# Patient Record
Sex: Male | Born: 1945 | Race: White | Hispanic: No | Smoking: Former smoker
Health system: Southern US, Community
[De-identification: ages and names within clinical notes are randomized; demographics above are authoritative.]

## PROBLEM LIST (undated history)

## (undated) DIAGNOSIS — I700 Atherosclerosis of aorta: Secondary | ICD-10-CM

## (undated) DIAGNOSIS — Z8719 Personal history of other diseases of the digestive system: Secondary | ICD-10-CM

## (undated) DIAGNOSIS — Z955 Presence of coronary angioplasty implant and graft: Secondary | ICD-10-CM

## (undated) DIAGNOSIS — J90 Pleural effusion, not elsewhere classified: Secondary | ICD-10-CM

## (undated) DIAGNOSIS — Z87442 Personal history of urinary calculi: Secondary | ICD-10-CM

## (undated) DIAGNOSIS — I517 Cardiomegaly: Secondary | ICD-10-CM

## (undated) DIAGNOSIS — R351 Nocturia: Secondary | ICD-10-CM

## (undated) DIAGNOSIS — M109 Gout, unspecified: Secondary | ICD-10-CM

## (undated) DIAGNOSIS — I10 Essential (primary) hypertension: Secondary | ICD-10-CM

## (undated) DIAGNOSIS — G894 Chronic pain syndrome: Secondary | ICD-10-CM

## (undated) DIAGNOSIS — N183 Chronic kidney disease, stage 3 unspecified: Secondary | ICD-10-CM

## (undated) DIAGNOSIS — M549 Dorsalgia, unspecified: Secondary | ICD-10-CM

## (undated) DIAGNOSIS — Z973 Presence of spectacles and contact lenses: Secondary | ICD-10-CM

## (undated) DIAGNOSIS — R609 Edema, unspecified: Secondary | ICD-10-CM

## (undated) DIAGNOSIS — I2510 Atherosclerotic heart disease of native coronary artery without angina pectoris: Secondary | ICD-10-CM

## (undated) DIAGNOSIS — M5137 Other intervertebral disc degeneration, lumbosacral region: Secondary | ICD-10-CM

## (undated) DIAGNOSIS — L0591 Pilonidal cyst without abscess: Secondary | ICD-10-CM

## (undated) DIAGNOSIS — K5909 Other constipation: Secondary | ICD-10-CM

## (undated) DIAGNOSIS — R0600 Dyspnea, unspecified: Secondary | ICD-10-CM

## (undated) DIAGNOSIS — J849 Interstitial pulmonary disease, unspecified: Secondary | ICD-10-CM

## (undated) DIAGNOSIS — M79602 Pain in left arm: Secondary | ICD-10-CM

## (undated) DIAGNOSIS — R600 Localized edema: Secondary | ICD-10-CM

## (undated) DIAGNOSIS — M51379 Other intervertebral disc degeneration, lumbosacral region without mention of lumbar back pain or lower extremity pain: Secondary | ICD-10-CM

## (undated) DIAGNOSIS — I6523 Occlusion and stenosis of bilateral carotid arteries: Secondary | ICD-10-CM

## (undated) DIAGNOSIS — D3502 Benign neoplasm of left adrenal gland: Secondary | ICD-10-CM

## (undated) DIAGNOSIS — Z972 Presence of dental prosthetic device (complete) (partial): Secondary | ICD-10-CM

## (undated) DIAGNOSIS — M5030 Other cervical disc degeneration, unspecified cervical region: Secondary | ICD-10-CM

## (undated) DIAGNOSIS — E785 Hyperlipidemia, unspecified: Secondary | ICD-10-CM

## (undated) DIAGNOSIS — I519 Heart disease, unspecified: Secondary | ICD-10-CM

## (undated) DIAGNOSIS — J8410 Pulmonary fibrosis, unspecified: Secondary | ICD-10-CM

## (undated) DIAGNOSIS — I5189 Other ill-defined heart diseases: Secondary | ICD-10-CM

## (undated) DIAGNOSIS — R200 Anesthesia of skin: Secondary | ICD-10-CM

## (undated) DIAGNOSIS — R0609 Other forms of dyspnea: Secondary | ICD-10-CM

## (undated) DIAGNOSIS — Z87898 Personal history of other specified conditions: Secondary | ICD-10-CM

## (undated) HISTORY — DX: Localized edema: R60.0

## (undated) HISTORY — PX: ANTERIOR CERVICAL DECOMP/DISCECTOMY FUSION: SHX1161

## (undated) HISTORY — PX: PILONIDAL CYST EXCISION: SHX744

## (undated) HISTORY — DX: Edema, unspecified: R60.9

## (undated) HISTORY — PX: CYSTOSCOPY W/ URETERAL STENT PLACEMENT: SHX1429

## (undated) HISTORY — DX: Essential (primary) hypertension: I10

## (undated) HISTORY — PX: CARDIOVASCULAR STRESS TEST: SHX262

## (undated) HISTORY — DX: Hyperlipidemia, unspecified: E78.5

## (undated) HISTORY — PX: CORONARY ANGIOPLASTY WITH STENT PLACEMENT: SHX49

## (undated) HISTORY — DX: Gout, unspecified: M10.9

## (undated) HISTORY — DX: Atherosclerotic heart disease of native coronary artery without angina pectoris: I25.10

## (undated) HISTORY — PX: TRANSTHORACIC ECHOCARDIOGRAM: SHX275

---

## 1994-06-21 HISTORY — PX: THROAT SURGERY: SHX803

## 1997-06-21 DIAGNOSIS — I2510 Atherosclerotic heart disease of native coronary artery without angina pectoris: Secondary | ICD-10-CM

## 1997-06-21 HISTORY — PX: CORONARY ANGIOPLASTY: SHX604

## 1997-06-21 HISTORY — DX: Atherosclerotic heart disease of native coronary artery without angina pectoris: I25.10

## 1998-04-11 ENCOUNTER — Inpatient Hospital Stay (HOSPITAL_COMMUNITY): Admission: RE | Admit: 1998-04-11 | Discharge: 1998-04-15 | Payer: Self-pay | Admitting: Cardiovascular Disease

## 2001-10-03 ENCOUNTER — Ambulatory Visit (HOSPITAL_COMMUNITY): Admission: RE | Admit: 2001-10-03 | Discharge: 2001-10-03 | Payer: Self-pay | Admitting: Family Medicine

## 2001-10-03 ENCOUNTER — Encounter: Payer: Self-pay | Admitting: Family Medicine

## 2001-12-25 ENCOUNTER — Inpatient Hospital Stay (HOSPITAL_COMMUNITY): Admission: RE | Admit: 2001-12-25 | Discharge: 2001-12-27 | Payer: Self-pay | Admitting: Neurosurgery

## 2003-02-20 ENCOUNTER — Ambulatory Visit (HOSPITAL_COMMUNITY): Admission: RE | Admit: 2003-02-20 | Discharge: 2003-02-20 | Payer: Self-pay | Admitting: Internal Medicine

## 2005-09-30 ENCOUNTER — Ambulatory Visit (HOSPITAL_COMMUNITY): Admission: RE | Admit: 2005-09-30 | Discharge: 2005-09-30 | Payer: Self-pay | Admitting: Cardiovascular Disease

## 2005-10-11 ENCOUNTER — Ambulatory Visit (HOSPITAL_COMMUNITY): Admission: RE | Admit: 2005-10-11 | Discharge: 2005-10-12 | Payer: Self-pay | Admitting: Cardiovascular Disease

## 2005-10-11 DIAGNOSIS — Z955 Presence of coronary angioplasty implant and graft: Secondary | ICD-10-CM

## 2005-10-11 HISTORY — DX: Presence of coronary angioplasty implant and graft: Z95.5

## 2007-08-10 ENCOUNTER — Encounter: Payer: Self-pay | Admitting: Orthopedic Surgery

## 2007-08-10 ENCOUNTER — Encounter: Admission: RE | Admit: 2007-08-10 | Discharge: 2007-08-10 | Payer: Self-pay | Admitting: Neurosurgery

## 2008-02-21 ENCOUNTER — Ambulatory Visit (HOSPITAL_COMMUNITY): Admission: RE | Admit: 2008-02-21 | Discharge: 2008-02-21 | Payer: Self-pay | Admitting: Podiatry

## 2009-09-08 ENCOUNTER — Ambulatory Visit: Payer: Self-pay | Admitting: Orthopedic Surgery

## 2009-09-08 DIAGNOSIS — M7650 Patellar tendinitis, unspecified knee: Secondary | ICD-10-CM | POA: Insufficient documentation

## 2009-09-08 DIAGNOSIS — M25569 Pain in unspecified knee: Secondary | ICD-10-CM | POA: Insufficient documentation

## 2009-09-11 ENCOUNTER — Telehealth: Payer: Self-pay | Admitting: Orthopedic Surgery

## 2009-09-14 ENCOUNTER — Inpatient Hospital Stay (HOSPITAL_COMMUNITY): Admission: EM | Admit: 2009-09-14 | Discharge: 2009-09-19 | Payer: Self-pay | Admitting: Emergency Medicine

## 2009-09-14 IMAGING — CR DG FOOT COMPLETE 3+V*R*
3 series · 3 of 3 positions shown · non-contrast
Comparison: None.

CLINICAL DATA: Pain

RIGHT FOOT COMPLETE - 3+ VIEW

[view not recorded (1 of 3)]
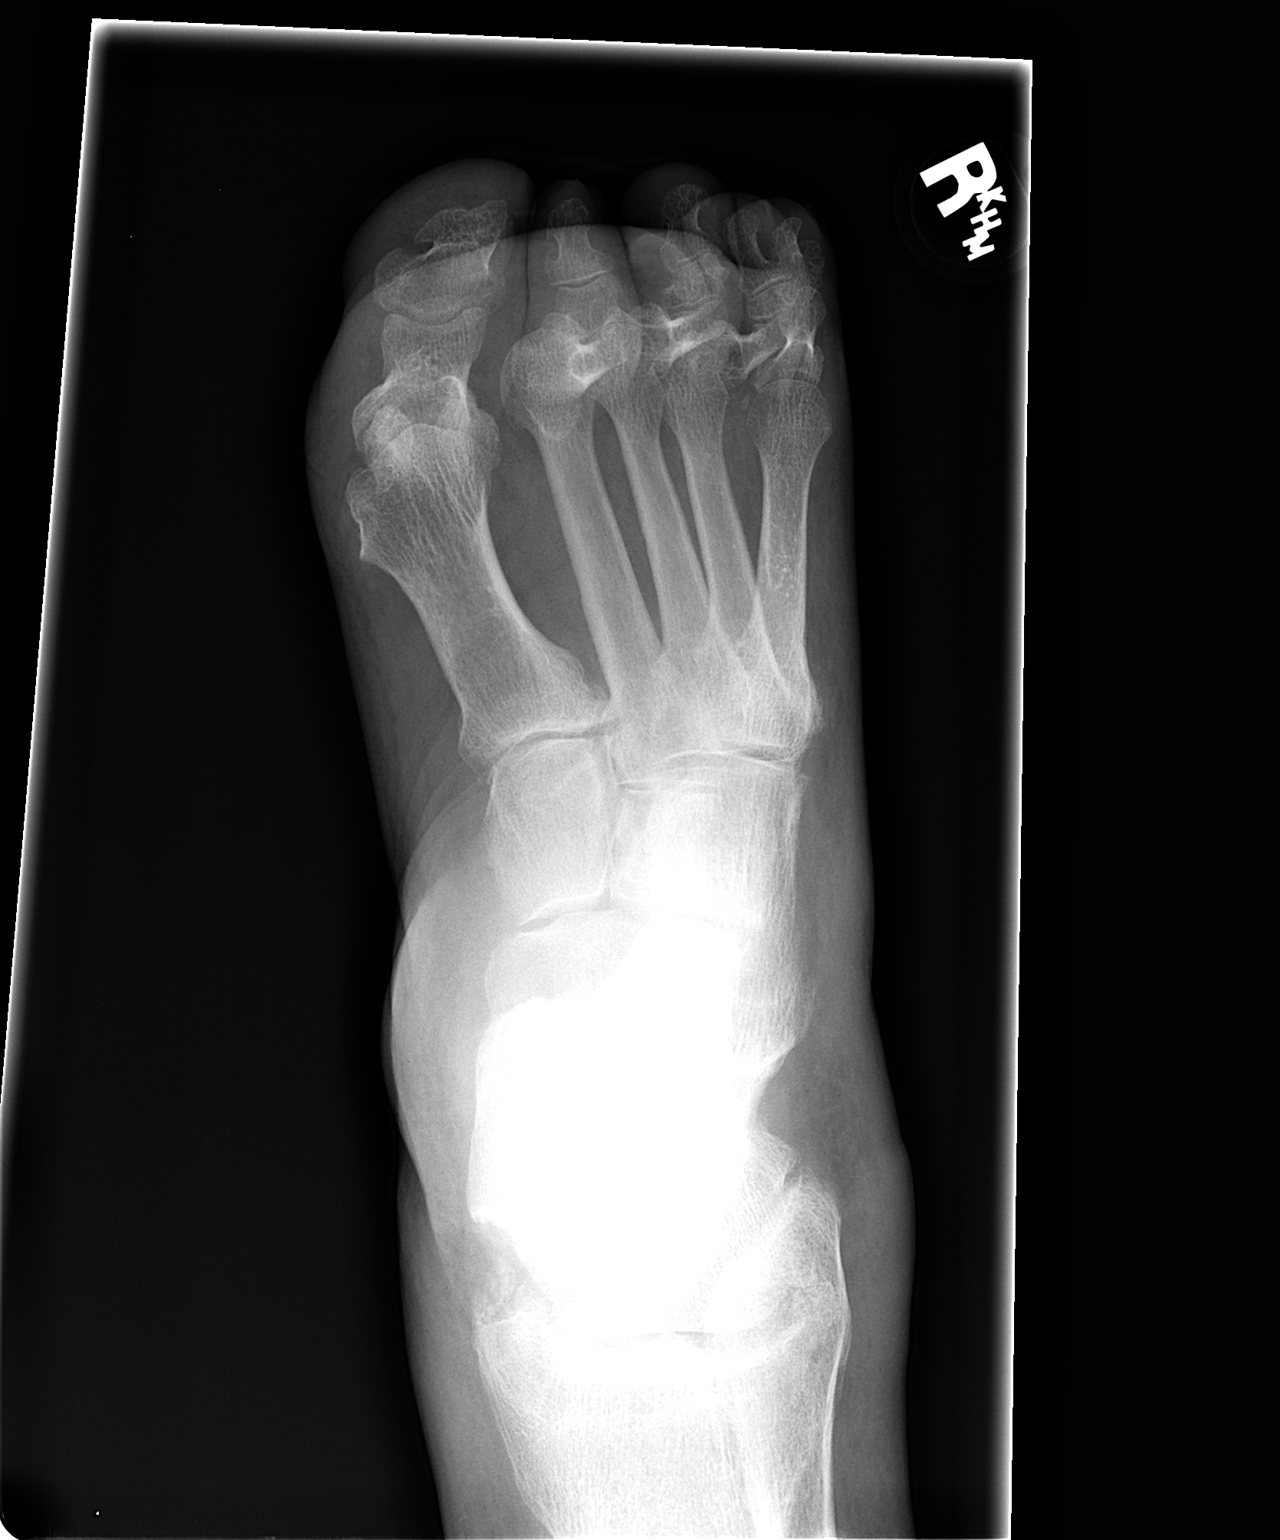

[view not recorded (2 of 3)]
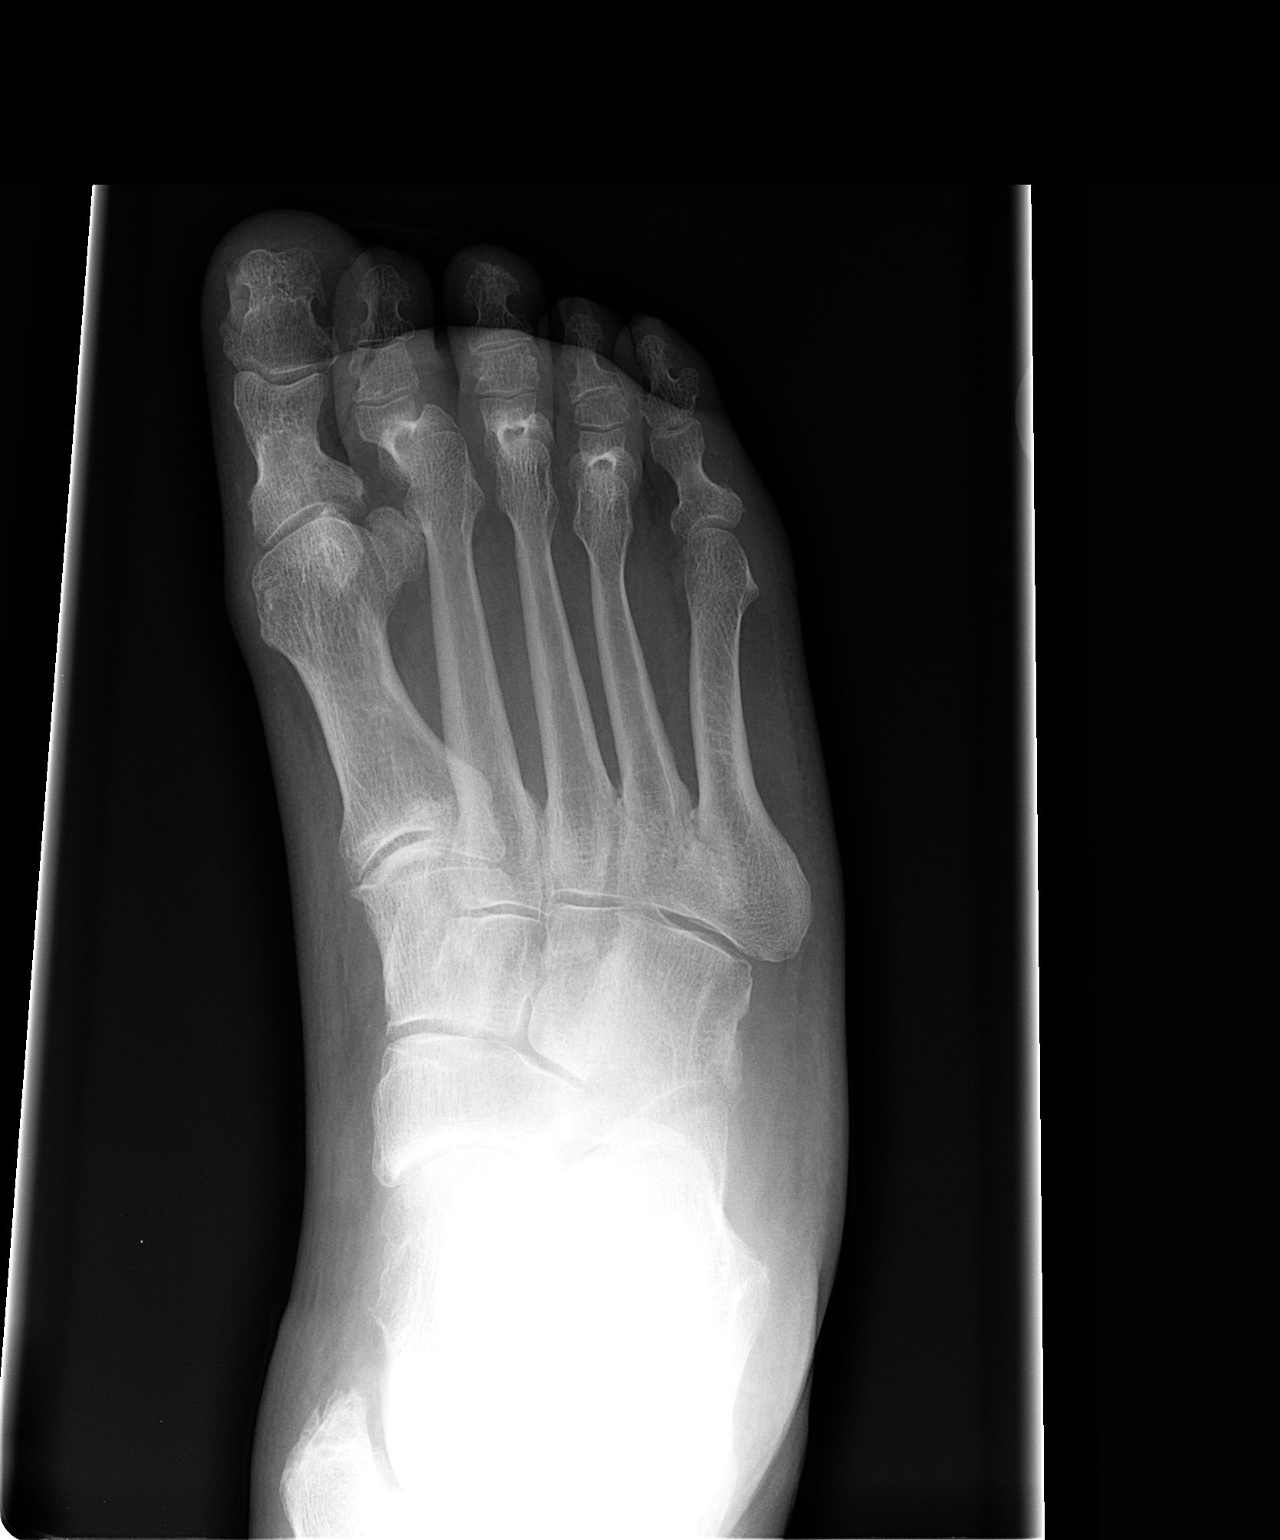

[view not recorded (3 of 3)]
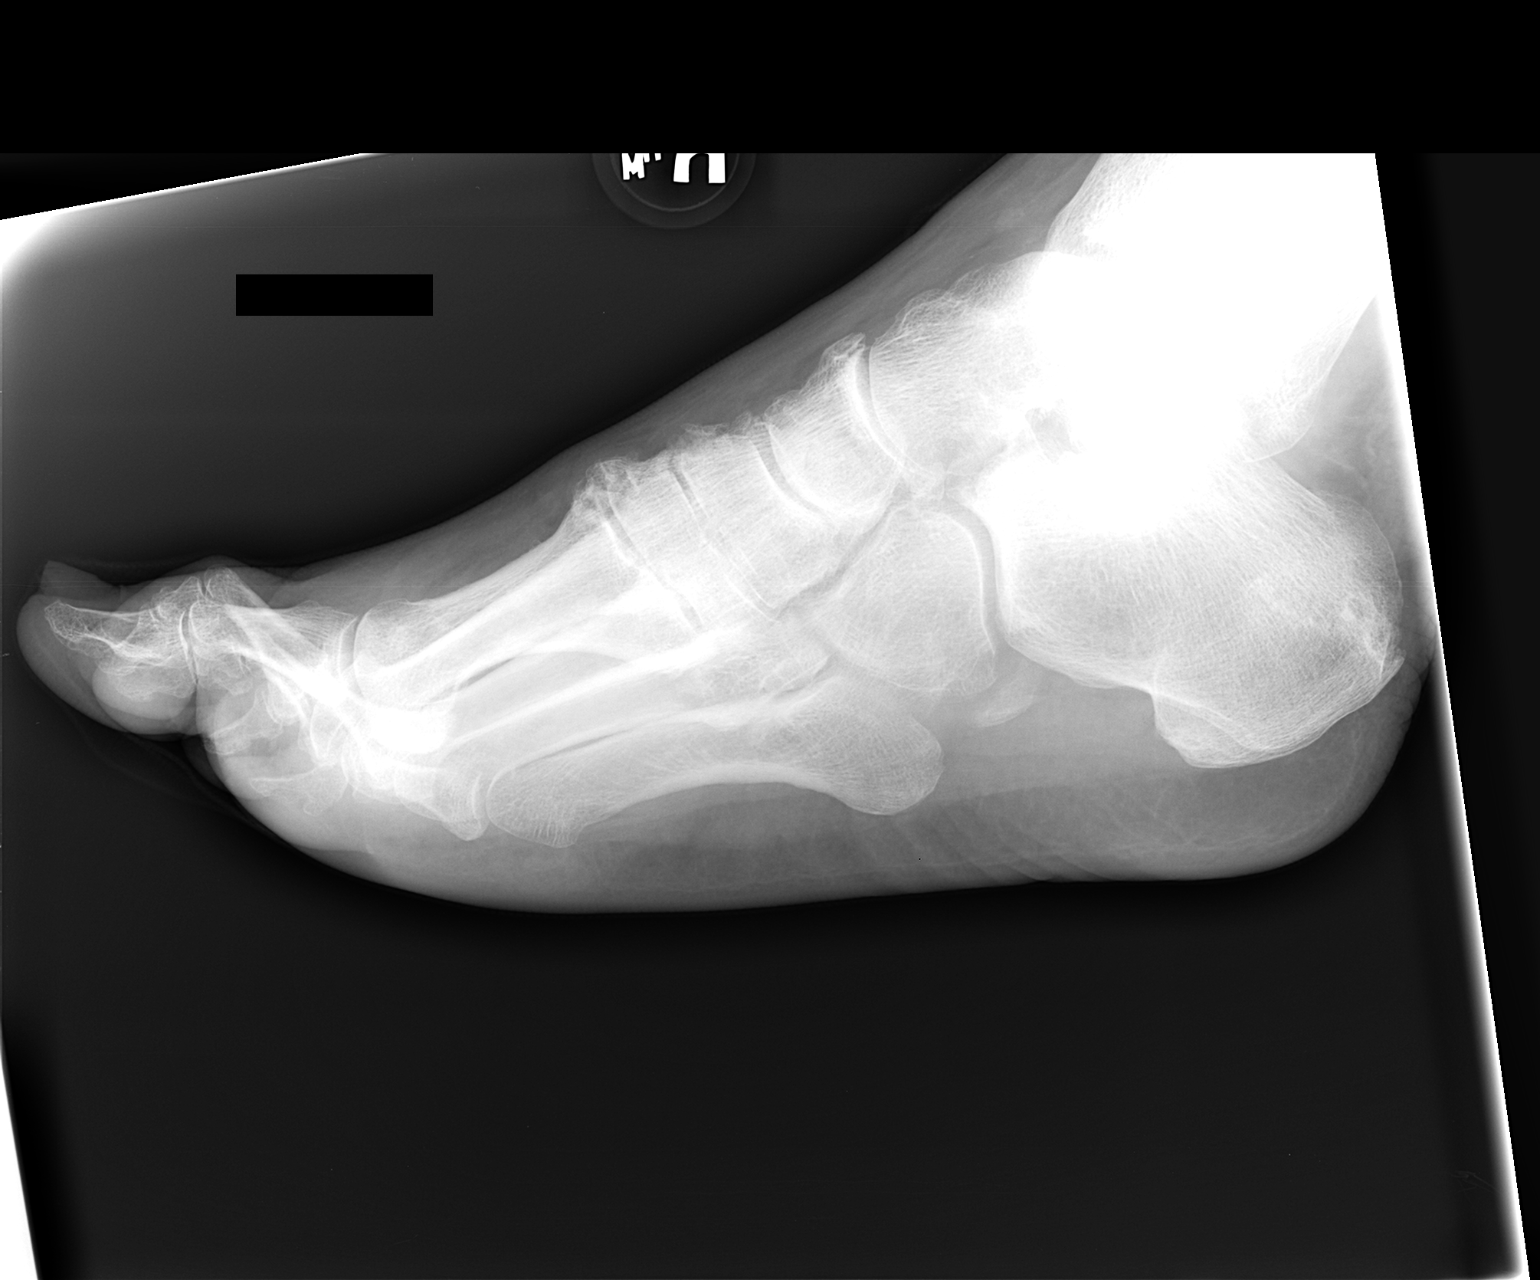

[3 of 3 positions shown; findings below may reference images not displayed]

FINDINGS: No acute fracture.  No dislocation.  Diffuse degenerative
changes in the tarsal bones.  Unremarkable soft tissues.
IMPRESSION: No acute bony pathology.

## 2009-09-15 ENCOUNTER — Ambulatory Visit: Payer: Self-pay | Admitting: Orthopedic Surgery

## 2009-09-29 ENCOUNTER — Ambulatory Visit: Payer: Self-pay | Admitting: Orthopedic Surgery

## 2009-09-30 ENCOUNTER — Encounter: Payer: Self-pay | Admitting: Orthopedic Surgery

## 2009-10-13 ENCOUNTER — Ambulatory Visit: Payer: Self-pay | Admitting: Orthopedic Surgery

## 2009-10-13 DIAGNOSIS — M109 Gout, unspecified: Secondary | ICD-10-CM

## 2009-11-19 ENCOUNTER — Ambulatory Visit: Payer: Self-pay | Admitting: Orthopedic Surgery

## 2010-02-10 ENCOUNTER — Inpatient Hospital Stay (HOSPITAL_COMMUNITY): Admission: EM | Admit: 2010-02-10 | Discharge: 2010-02-12 | Payer: Self-pay | Admitting: Emergency Medicine

## 2010-02-24 ENCOUNTER — Emergency Department (HOSPITAL_COMMUNITY): Admission: EM | Admit: 2010-02-24 | Discharge: 2010-02-24 | Payer: Self-pay | Admitting: Emergency Medicine

## 2010-03-08 ENCOUNTER — Emergency Department (HOSPITAL_COMMUNITY): Admission: EM | Admit: 2010-03-08 | Discharge: 2010-03-08 | Payer: Self-pay | Admitting: Emergency Medicine

## 2010-03-16 ENCOUNTER — Ambulatory Visit (HOSPITAL_COMMUNITY): Admission: RE | Admit: 2010-03-16 | Discharge: 2010-03-16 | Payer: Self-pay | Admitting: Urology

## 2010-03-19 ENCOUNTER — Ambulatory Visit (HOSPITAL_COMMUNITY): Admission: RE | Admit: 2010-03-19 | Discharge: 2010-03-19 | Payer: Self-pay | Admitting: Urology

## 2010-07-23 NOTE — Assessment & Plan Note (Signed)
Summary: 1 M RE-CK KNEES/SEC HORIZ/CAF   Visit Type:  Follow-up Referring Provider:  self Primary Provider:  Dr Regino Schultze  CC:  one month recheck knee.  History of Present Illness: I saw Maurice Munoz in the office today for a followup visit.  He is a 65 years old man with the complaint of:  1 month recheck knees after  Meds: Tizanidine, Melopizolol, Naproxen, Isosorb mono, Plavix, Endocet 10, Torsenude, ASA, Colcrys ran out   Indomethacin started last week for gout flare up, helps.  Had a flare up last week right knee, doing better after taking Indocin.  NAPROSYN GIVES HIM SWELLING     Allergies: 1)  ! Pcn 2)  ! Motrin   Impression & Recommendations:  Problem # 1:  GOUT (ICD-274.9) Assessment Improved  Looks like we've made headway in terms of his gout so I would like him to continue with Indocin as needed continue allopurinol twice a day did not take Naprosyn  Orders: Est. Patient Level II (57846)  Patient Instructions: 1)  DONT TAKE NAPROSYN  2)  TAKE INDOCIN AS NEEDED  3)  F/U AS NEEDED  4)  CONTINUE ALLOPURINOL two times a day

## 2010-07-23 NOTE — Progress Notes (Signed)
Summary: Pain is no better  Phone Note Call from Patient   Summary of Call: Maurice Munoz ([id number]) was seen 09/08/09 for right keen pain.  Very concerned because now the pain is in both feet and going up his other leg, so painful he can hardly walk.  Says he has been taking Naprosyn several years and is concerned that it just doesn't help his pain any more. Says his pain is no better than when he was here 09/08/09, says it is actually worse. His # [phone] Initial call taken by: Jacklynn Ganong,  September 11, 2009 12:04 PM  Follow-up for Phone Call        no further instructions right now the injection will cause increase in pain at first  Apply ice pack to the area for 20 minutes every 2 hours and take 2 xtra strength tylenol every 8 hours. This increased pain will usually resolve in 24 hours. The injection will take effect in 3-10 days.   Follow-up by: Fuller Canada MD,  September 11, 2009 12:23 PM  Additional Follow-up for Phone Call Additional follow up Details #1::        Advised the patient of doctor's reply.  He said if he got any worse he is going to the emergency room. Additional Follow-up by: Jacklynn Ganong,  September 11, 2009 2:32 PM

## 2010-07-23 NOTE — Letter (Signed)
Summary: Historic Patient File  Historic Patient File   Imported By: Elvera Maria 09/19/2009 10:18:04  _____________________________________________________________________  External Attachment:    Type:   Image     Comment:   history form

## 2010-07-23 NOTE — Assessment & Plan Note (Signed)
Summary: RT KNEE PAIN/SEC HOR/BSF   Visit Type:  Initial visit Referring Provider:  self Primary Provider:  Dr Regino Schultze  CC:  knee pain.  History of Present Illness: 65 year old male currently disabled presents with 2 weeks of throbbing constant severe knee pain which came on gradually associated with some locking.  He received an IM shot and some Indocin last week from his primary care doctor did not improve, he is also on Endocet 10 mg still did not help seems to hurt when he moves his knee.  No warmth no swelling.  Pain seems to be in the front of the knee.  Although he is using a cane and is having severe trouble ambulating Will have xrays today.  Meds: Tizanidine, Melopizolol, Naproxen, Isosorb mono, Plavix, Endocet 10, Torsenude, ASA.  I also have a report of an MRI done of his spine February 2009 he's had some previous surgery he has spinal stenosis.  He is followed by Dr. Tressie Stalker in [facility]   Allergies (verified): 1)  ! Pcn 2)  ! Motrin  Past History:  Past Medical History: chronic back pain htn cholesterol  Past Surgical History: heart stents back neck  Family History: na  Social History: Patient is married.  disabled no smoking, alcohol or caffeine  Review of Systems Constitutional:  Denies weight loss, weight gain, fever, chills, and fatigue. Cardiovascular:  Complains of chest pain; denies palpitations, fainting, and murmurs. Respiratory:  Denies short of breath, wheezing, couch, tightness, pain on inspiration, and snoring . Gastrointestinal:  Denies heartburn, nausea, vomiting, diarrhea, constipation, and blood in your stools. Genitourinary:  Denies frequency, urgency, difficulty urinating, painful urination, flank pain, and bleeding in urine. Neurologic:  Denies numbness, tingling, unsteady gait, dizziness, tremors, and seizure. Musculoskeletal:  Complains of joint pain; denies swelling, instability, stiffness, redness, heat,  and muscle pain. Endocrine:  Denies excessive thirst, exessive urination, and heat or cold intolerance. Psychiatric:  Denies nervousness, depression, anxiety, and hallucinations. Skin:  Denies changes in the skin, poor healing, rash, itching, and redness. HEENT:  Denies blurred or double vision, eye pain, redness, and watering. Immunology:  Denies seasonal allergies, sinus problems, and allergic to bee stings. Hemoatologic:  Complains of easy bleeding; denies brusing.  Physical Exam  Additional Exam:  GEN: well developed, well nourished, normal grooming and hygiene, no deformity and normal body habitus.   CDV: pulses are normal, no edema, no erythema. no tenderness  Lymph: normal lymph nodes   Skin: no rashes, skin lesions or open sores   NEURO: normal coordination, reflexes, sensation.   Psyche: awake, alert and oriented. Mood normal   Gait: abnormal he can only ambulate with partial weightbearing in a walker.  Has swelling over his RIGHT tibial tubercle is very tender warm to touch.  It affects his range of motion which is 10-110  Motor exam seems normal knee is stable.       Impression & Recommendations:  Problem # 1:  KNEE PAIN (ICD-719.46)  Orders: New Patient Level III (16109) Joint Aspirate / Injection, Large (20610) Depo- Medrol 40mg  (J1030) Knee x-ray,  3 views (60454)  Problem # 2:  PATELLAR TENDINITIS (ICD-726.64)  previous RIGHT knee  There is a bone spur and entheseophyte  in the patella tendon at the tubercle  He has no other changes there or at the distal pole of patella but he has some degenerative changes in the knee joint there mild to moderate  Impression patellar tendinitis chronic based on the spur and the  tendon mild to moderate degenerative arthritis  Injection at the patellar tendon insertion into the tubercle Verbal consent was obtained. The knee was prepped with alcohol and ethyl chloride. 1 cc of depomedrol 40mg /cc and 4 cc of  lidocaine 1% was injected. there were no complications.  Orders: New Patient Level III (16109) Joint Aspirate / Injection, Large (20610) Depo- Medrol 40mg  (J1030) Knee x-ray,  3 views (60454)  Patient Instructions: 1)  brace wear until I see you next time  2)  apply ice every 2 hours  3)  wear flector patch 1 two times a day  4)  injection 5)  return in 1 week

## 2010-07-23 NOTE — Assessment & Plan Note (Signed)
Summary: 2 WK RE-CK KNEES/SEC HORIZ/CAF   Visit Type:  Follow-up Referring Provider:  self Primary Provider:  Dr Regino Schultze  CC:  recheck knees.  History of Present Illness: I saw Maurice Munoz in the office today for a followup visit.  He is a 65 years old man with the complaint of:  2 week recheck knees after alternating gout meds.  Doing better with his knees mild pain was able to plant garden   ROS: Feet still hurt., bilateral foot pain when walking.  Meds: Tizanidine, Melopizolol, Naproxen, Isosorb mono, Plavix, Endocet 10, Torsenude, ASA, Colcrys. [take 1 colcrys per day]    Allergies: 1)  ! Pcn 2)  ! Motrin  Review of Systems Musculoskeletal:  back pain  foot pain  carpal tunnel syndrome right hand  numbness left upper extremity .  Physical Exam  Additional Exam:  GENERAL: Appearance is normal, lost 20 lbs   CDV: normal pulse and temperature   Skin: was normal   Neuro: sensation was normal   MSK feet. knees no knee swelling, no joint line tenerness, tender at the tibial tubercle  ROM is normal   feet are mildly swollen / no tendereness    *MOTOR: 5/5      Impression & Recommendations:  Problem # 1:  KNEE PAIN (ICD-719.46)  Orders: Est. 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 Patient Level III (56213)  Problem # 3:  GOUT (ICD-274.9) Assessment: Improved  Try allopurinol   decrease colcrys to 1 a day   Orders: Est. Patient Level III (08657)  Medications Added to Medication List This Visit: 1)  Allopurinol 100 Mg Tabs (Allopurinol) .Marland Kitchen.. 1 by mouth bid  Patient Instructions: 1)  return in a month  Prescriptions: ALLOPURINOL 100 MG TABS (ALLOPURINOL) 1 by mouth bid  #60 x 0   Entered and Authorized by:   Fuller Canada MD   Signed by:   Fuller Canada MD on 10/13/2009   Method used:   Print then Give to Patient   RxID:   [id number]

## 2010-07-23 NOTE — Assessment & Plan Note (Signed)
Summary: recheck attack of gout in knees from hospital er fu per [name]...   Visit Type:  Follow-up Referring Provider:  self Primary Provider:  Dr Regino Schultze  CC:  recheck on knees and feet.  History of Present Illness: I saw Maurice Munoz in the office today for a followup visit.  He is a 65 years old man with the complaint of:  FU from hospital from gout attack.  The colchicine has been discontinued and he was not put on another medication.  Had a severe gout attack which required IV steroids, intra-articular injections and resuming of the colchicine like medicine  He is improved 95% has a little bit of tenderness in the anterior RIGHT knee  He took his last prednisone today  Done much better he's and plating at the cane.  Both knees swelling is down.  He has 125 plus of flexion in both knees.  Muscle strength and tone are normal.  Both knees are stable skin is intact.  Elbow is nontender.  Full range of motion.  improved  Follow up 2 weeks continue colchicine like medication  Allergies: 1)  ! Pcn 2)  ! Motrin   Other Orders: Est. Patient Level II (84132)  Patient Instructions: 1)  Continue the colchicine alternative two times a day  2)  f/u in 2 weeks

## 2010-07-23 NOTE — Consult Note (Signed)
Summary: Hospital Consult  Knee pain  Hospital Consult  Knee pain   Imported By: Cammie Sickle 09/30/2009 12:49:59  _____________________________________________________________________  External Attachment:    Type:   Image     Comment:   External Document

## 2010-09-03 LAB — DIFFERENTIAL
Basophils Absolute: 0 10*3/uL (ref 0.0–0.1)
Basophils Absolute: 0 10*3/uL (ref 0.0–0.1)
Basophils Absolute: 0 10*3/uL (ref 0.0–0.1)
Basophils Relative: 0 % (ref 0–1)
Basophils Relative: 0 % (ref 0–1)
Lymphocytes Relative: 12 % (ref 12–46)
Lymphocytes Relative: 16 % (ref 12–46)
Lymphs Abs: 1.9 10*3/uL (ref 0.7–4.0)
Monocytes Absolute: 0.9 10*3/uL (ref 0.1–1.0)
Monocytes Absolute: 1.1 10*3/uL — ABNORMAL HIGH (ref 0.1–1.0)
Monocytes Relative: 9 % (ref 3–12)
Neutro Abs: 10.1 10*3/uL — ABNORMAL HIGH (ref 1.7–7.7)
Neutro Abs: 12.8 10*3/uL — ABNORMAL HIGH (ref 1.7–7.7)
Neutro Abs: 7.8 10*3/uL — ABNORMAL HIGH (ref 1.7–7.7)
Neutrophils Relative %: 74 % (ref 43–77)
Neutrophils Relative %: 81 % — ABNORMAL HIGH (ref 43–77)

## 2010-09-03 LAB — URINALYSIS, ROUTINE W REFLEX MICROSCOPIC
Bilirubin Urine: NEGATIVE
Glucose, UA: NEGATIVE mg/dL
Ketones, ur: NEGATIVE mg/dL
Leukocytes, UA: NEGATIVE
Nitrite: NEGATIVE
Protein, ur: 100 mg/dL — AB
Specific Gravity, Urine: 1.012 (ref 1.005–1.030)
Urobilinogen, UA: 0.2 mg/dL (ref 0.0–1.0)
pH: 5.5 (ref 5.0–8.0)
pH: 5.5 (ref 5.0–8.0)

## 2010-09-03 LAB — CBC
HCT: 41.1 % (ref 39.0–52.0)
Hemoglobin: 15.6 g/dL (ref 13.0–17.0)
MCHC: 33 g/dL (ref 30.0–36.0)
MCV: 86.9 fL (ref 78.0–100.0)
MCV: 87.4 fL (ref 78.0–100.0)
Platelets: 207 10*3/uL (ref 150–400)
Platelets: 211 10*3/uL (ref 150–400)
RBC: 5.4 MIL/uL (ref 4.22–5.81)
RDW: 13.9 % (ref 11.5–15.5)
RDW: 14 % (ref 11.5–15.5)
RDW: 14.1 % (ref 11.5–15.5)
WBC: 10.6 10*3/uL — ABNORMAL HIGH (ref 4.0–10.5)

## 2010-09-03 LAB — BASIC METABOLIC PANEL
BUN: 29 mg/dL — ABNORMAL HIGH (ref 6–23)
GFR calc non Af Amer: 51 mL/min — ABNORMAL LOW (ref >60)
Potassium: 3.6 mEq/L (ref 3.5–5.1)

## 2010-09-03 LAB — COMPREHENSIVE METABOLIC PANEL
ALT: 20 U/L (ref 0–53)
Albumin: 4.4 g/dL (ref 3.5–5.2)
Alkaline Phosphatase: 58 U/L (ref 39–117)
Alkaline Phosphatase: 75 U/L (ref 39–117)
BUN: 30 mg/dL — ABNORMAL HIGH (ref 6–23)
Chloride: 104 mEq/L (ref 96–112)
GFR calc Af Amer: 54 mL/min — ABNORMAL LOW (ref >60)
Glucose, Bld: 114 mg/dL — ABNORMAL HIGH (ref 70–99)
Potassium: 3.8 mEq/L (ref 3.5–5.1)
Potassium: 3.9 mEq/L (ref 3.5–5.1)
Total Bilirubin: 0.7 mg/dL (ref 0.3–1.2)
Total Bilirubin: 1.1 mg/dL (ref 0.3–1.2)

## 2010-09-03 LAB — URINE CULTURE
Colony Count: NO GROWTH
Culture  Setup Time: 201108231812
Culture: NO GROWTH

## 2010-09-03 LAB — TSH: TSH: 0.707 u[IU]/mL (ref 0.350–4.500)

## 2010-09-03 LAB — URINE MICROSCOPIC-ADD ON

## 2010-09-03 LAB — T4, FREE: Free T4: 1.14 ng/dL (ref 0.80–1.80)

## 2010-09-09 LAB — BASIC METABOLIC PANEL
Calcium: 9.3 mg/dL (ref 8.4–10.5)
GFR calc Af Amer: >60 mL/min (ref >60)
GFR calc non Af Amer: >60 mL/min (ref >60)
Sodium: 135 mEq/L (ref 135–145)

## 2010-09-13 LAB — BASIC METABOLIC PANEL
BUN: 34 mg/dL — ABNORMAL HIGH (ref 6–23)
CO2: 26 mEq/L (ref 19–32)
Calcium: 9.2 mg/dL (ref 8.4–10.5)
Calcium: 9.4 mg/dL (ref 8.4–10.5)
Chloride: 100 mEq/L (ref 96–112)
Chloride: 101 mEq/L (ref 96–112)
Creatinine, Ser: 1.18 mg/dL (ref 0.4–1.5)
Creatinine, Ser: 1.2 mg/dL (ref 0.4–1.5)
Creatinine, Ser: 1.41 mg/dL (ref 0.4–1.5)
GFR calc Af Amer: >60 mL/min (ref >60)
GFR calc Af Amer: >60 mL/min (ref >60)
GFR calc Af Amer: >60 mL/min (ref >60)
GFR calc Af Amer: >60 mL/min (ref >60)
GFR calc non Af Amer: >60 mL/min (ref >60)
GFR calc non Af Amer: >60 mL/min (ref >60)
GFR calc non Af Amer: >60 mL/min (ref >60)
Glucose, Bld: 147 mg/dL — ABNORMAL HIGH (ref 70–99)
Glucose, Bld: 89 mg/dL (ref 70–99)
Potassium: 4.2 mEq/L (ref 3.5–5.1)
Potassium: 5.5 mEq/L — ABNORMAL HIGH (ref 3.5–5.1)
Sodium: 133 mEq/L — ABNORMAL LOW (ref 135–145)
Sodium: 136 mEq/L (ref 135–145)
Sodium: 138 mEq/L (ref 135–145)

## 2010-09-13 LAB — DIFFERENTIAL
Basophils Absolute: 0 10*3/uL (ref 0.0–0.1)
Basophils Absolute: 0 10*3/uL (ref 0.0–0.1)
Basophils Relative: 0 % (ref 0–1)
Eosinophils Absolute: 0 10*3/uL (ref 0.0–0.7)
Eosinophils Absolute: 0.2 10*3/uL (ref 0.0–0.7)
Eosinophils Relative: 0 % (ref 0–5)
Eosinophils Relative: 2 % (ref 0–5)
Lymphocytes Relative: 15 % (ref 12–46)
Lymphocytes Relative: 8 % — ABNORMAL LOW (ref 12–46)
Lymphs Abs: 0.8 10*3/uL (ref 0.7–4.0)
Lymphs Abs: 1.5 10*3/uL (ref 0.7–4.0)
Monocytes Absolute: 0.3 10*3/uL (ref 0.1–1.0)
Monocytes Relative: 3 % (ref 3–12)
Monocytes Relative: 9 % (ref 3–12)
Neutro Abs: 8.7 10*3/uL — ABNORMAL HIGH (ref 1.7–7.7)
Neutrophils Relative %: 76 % (ref 43–77)
Neutrophils Relative %: 78 % — ABNORMAL HIGH (ref 43–77)

## 2010-09-13 LAB — GLUCOSE, CAPILLARY
Glucose-Capillary: 110 mg/dL — ABNORMAL HIGH (ref 70–99)
Glucose-Capillary: 133 mg/dL — ABNORMAL HIGH (ref 70–99)
Glucose-Capillary: 134 mg/dL — ABNORMAL HIGH (ref 70–99)
Glucose-Capillary: 141 mg/dL — ABNORMAL HIGH (ref 70–99)
Glucose-Capillary: 93 mg/dL (ref 70–99)
Glucose-Capillary: 94 mg/dL (ref 70–99)

## 2010-09-13 LAB — HEPATIC FUNCTION PANEL
ALT: 17 U/L (ref 0–53)
AST: 21 U/L (ref 0–37)
Alkaline Phosphatase: 70 U/L (ref 39–117)
Bilirubin, Direct: 0.2 mg/dL (ref 0.0–0.3)
Indirect Bilirubin: 1 mg/dL — ABNORMAL HIGH (ref 0.3–0.9)
Total Bilirubin: 1.2 mg/dL (ref 0.3–1.2)

## 2010-09-13 LAB — CBC
HCT: 40.9 % (ref 39.0–52.0)
Hemoglobin: 14.1 g/dL (ref 13.0–17.0)
Hemoglobin: 14.9 g/dL (ref 13.0–17.0)
Platelets: 265 10*3/uL (ref 150–400)
RBC: 4.7 MIL/uL (ref 4.22–5.81)
RBC: 5.01 MIL/uL (ref 4.22–5.81)
RDW: 13.7 % (ref 11.5–15.5)
WBC: 10.1 10*3/uL (ref 4.0–10.5)
WBC: 11.5 10*3/uL — ABNORMAL HIGH (ref 4.0–10.5)
WBC: 9.8 10*3/uL (ref 4.0–10.5)

## 2010-09-13 LAB — COMPREHENSIVE METABOLIC PANEL
ALT: 21 U/L (ref 0–53)
AST: 18 U/L (ref 0–37)
Alkaline Phosphatase: 79 U/L (ref 39–117)
CO2: 29 mEq/L (ref 19–32)
GFR calc Af Amer: >60 mL/min (ref >60)
GFR calc non Af Amer: 55 mL/min — ABNORMAL LOW (ref >60)
Glucose, Bld: 96 mg/dL (ref 70–99)
Potassium: 4.7 mEq/L (ref 3.5–5.1)
Sodium: 136 mEq/L (ref 135–145)

## 2010-09-13 LAB — LIPID PANEL
LDL Cholesterol: 81 mg/dL (ref 0–99)
Total CHOL/HDL Ratio: 4.2 RATIO
VLDL: 19 mg/dL (ref 0–40)

## 2010-09-13 LAB — SEDIMENTATION RATE
Sed Rate: 100 mm/hr — ABNORMAL HIGH (ref 0–16)
Sed Rate: 36 mm/hr — ABNORMAL HIGH (ref 0–16)

## 2010-09-13 LAB — URIC ACID: Uric Acid, Serum: 9.6 mg/dL — ABNORMAL HIGH (ref 4.0–7.8)

## 2010-11-06 NOTE — Op Note (Signed)
[facility]  Patient:    Maurice Munoz, Maurice Munoz Visit Number: [id number] MRN: [id number]          Service Type: SUR Location: [id number] Attending Physician:  Cristi Loron Dictated by:   Cristi Loron, M.D. Proc. Date: 12/25/01 Admit Date:  12/25/2001 Discharge Date: 12/27/2001                             Operative Report  PREOPERATIVE DIAGNOSES:  C3-4, C4-5, C5-6, C6-7 herniated nucleus pulposus, spondylosis, degenerative disk disease, stenosis, cervicalgia, cervical myelopathy, cervical radiculopathy.  POSTOPERATIVE DIAGNOSES:  C3-4, C4-5, C5-6, C6-7 herniated nucleus pulposus, spondylosis, degenerative disk disease, stenosis, cervicalgia, cervical myelopathy, cervical radiculopathy.  PROCEDURES:  C3-4, C4-5, C5-6, C6-7 extensive anterior cervical diskectomy, interbody iliac crest allograft arthrodesis, anterior cervical plating (Synthes titanium plate and screw).  SURGEON:  Cristi Loron, M.D.  ASSISTANT:  Tanya Nones. Jeral Fruit, M.D.  ANESTHESIA:  General endotracheal.  ESTIMATED BLOOD LOSS:  400 cc.  SPECIMENS:  None.  DRAINS:  None.  COMPLICATIONS:  None.  BRIEF HISTORY:  The patient is a 65 year old white male who has suffered from neck and bilateral arm and hand numbness.  He was worked up with a cervical MRI, which demonstrated severe spinal stenosis at C3-4 and C5-6 with spinal cord signal changes as well as significant stenosis and herniated disk at C4-5 and C6-7.  I discussed the various treatment options with him, including surgery.  The patient weighed the risks, benefits, and alternatives of surgery and decided to proceed with a four-level anterior cervical diskectomy, fusion, and plating.  DESCRIPTION OF PROCEDURE:  The patient was brought to the operating room by the anesthesia team.  General endotracheal anesthesia was carefully induced. The patient remained in the supine position.  A roll was placed  under his shoulders to place his neck in slight extension.  His anterior cervical region was then prepared with Betadine scrub and Betadine solution.  Sterile drapes were applied.  I then injected the area to be incised with Marcaine with epinephrine solution and used a scalpel to make an incision along the medial border of the left sternocleidomastoid muscle.  I used the Metzenbaum scissors to divide the platysma muscle and dissect medial to the sternocleidomastoid muscle, jugular vein, and carotid artery.  I brought the dissection down toward the anterior cervical spine and carefully identified the esophagus and retracted the esophagus medially and cleared the soft tissue from the anterior cervical spine using Kitner swabs.  I then inserted a bent spinal needle at the upper exposed interspace.  I obtained the intraoperative radiograph to confirm our location.  I then used electrocautery to detach the medial border of the longus colli muscle bilaterally from the C3-4, C4-5, C5-6, and C6-7 intervertebral disk space.  I inserted the Caspar self-retaining retractor for exposure.  We began at C3-4, incising the C3-4 intervertebral disk with the 15 blade scalpel and performed a partial diskectomy with the pituitary forceps.  We inserted distraction screws at C3 and C4, distracted the interspace, and then used the high-speed drill to remove some ventral spondylosis from the vertebral edges.  I then drilled away the remainder of the C3-4 intervertebral disk as well as spondylosis from the posterior vertebral bodies.  I thinned out the posterior longitudinal ligament with the drill and incised it with the arachnoid knife and removed it with the Kerrison punch, carefully undercutting the vertebral end plates at  C3-4, decompressing the thecal sac, and then performed a foraminotomy about the bilateral C4 nerve roots.  This procedure was repeated identically at the C4-5, C5-6, C6-7 intervertebral  disk space by incising the intervertebral disk, inserting the distraction screws, and using the high-speed drill to drill away the remainder of the intervertebral disk and incising the posterior longitudinal ligament with the arachnoid knife, undercutting the vertebral end plates, and decompressing the thecal sac at C4-5, C5-6, C6-7, and performing a foraminotomy about the bilateral C5, C6, and C7 nerve roots.  Having completed the decompression, I now turned my attention to the arthrodesis.  We obtained iliac crest tricortical bone graft and fashioned them to these approximate dimensions:  8-9 mm in height, 1 cm in depth.  A bone graft was placed into each interspace after distracting it with the distraction screws.  The distraction screws were then removed and the bone grafts were noted to be in good, snug position at each interspace.  We now turned attention to anterior spinal instrumentation.  We obtained the appropriate length Synthes anterior cervical plate.  I contoured it in the appropriate lordosis, then laid it along the anterior aspect of the vertebral bodies from C3 to C7.  There was considerable ventral spondylosis, which required me to remove the plate and then use the high-speed drill to remove some of the bone spurs ventrally so that the plate would lie flat, and then we placed the plate and then drilled two holes at C3, C4, C5, C6, and C7, tapped the holes, and secured the plate to each vertebral body with two 14 mm screws. We then obtained the intraoperative radiograph.  The upper screws looked to be in good position.  The lower screws were not well-seen because of the patients body habitus but looked good in vivo.  We therefore secured the screws to the plate using the locking screw and the screw.  We achieved hemostasis using bipolar electrocautery and Gelfoam.  The wound was then copiously irrigated out with bacitracin solution.  The solution was removed, as was the  Engineer, technical sales.  I inspected the esophagus for any damage.  There was none apparent.  We then reapproximated the patients  platysma muscle with interrupted 3-0 Vicryl suture, the subcutaneous tissue with interrupted 3-0 Vicryl suture, and the skin with Steri-Strips and benzoin.  The wound was then coated with bacitracin ointment and sterile dressings applied, the drapes were removed.  The patient was subsequently extubated by the anesthesia team and transported to the postanesthesia care unit in stable condition.  All sponge, instrument, and needle counts were correct at the end of this case. Dictated by:   Cristi Loron, M.D. Attending Physician:  Tressie Stalker D DD:  12/25/01 TD:  12/28/01 Job: [id number]

## 2010-11-06 NOTE — Consult Note (Signed)
NAME:  Maurice Munoz, Maurice Munoz NO.:  [id number]   MEDICAL RECORD NO.:  [id number]                  PATIENT TYPE:   LOCATION:                                       FACILITY:  [facility]   PHYSICIAN:  Lionel December, M.D.                 DATE OF BIRTH:  [birth year]   DATE OF CONSULTATION:  02/05/2003  DATE OF DISCHARGE:                                   CONSULTATION   REPORT TITLE:  GASTROENTEROLOGY CONSULTATION   CONSULTING PHYSICIAN:  Lionel December, M.D.   REFERRING PHYSICIAN:  Patrica Duel, M.D.   REASON FOR CONSULTATION:  1. Constipation.  2. Rectal bleeding.   HISTORY OF PRESENT ILLNESS:  Maurice Munoz is a 65 year old Caucasian gentleman  who presents today for further evaluation of progressive constipation and  intermittent hematochezia.  He had neck surgery July 2003, and since that  time has been very inactive.  He has had to take chronic, intermittent,  narcotic pain medication for pain control.  He has had progressive  constipation over the last several months.  At this point, he is only having  bowel movements if he takes a laxative.  He generally does this every 2-3  days.  He has decreased his pain medication use and has not taken any for  several weeks.   He has been tried on multiple medications including stool softeners,  Metamucil, MiraLax with no relief.  He gets relief if he takes Correctol or  magnesium citrate.  When he has to strain with defecation, he has seen  bright red blood per rectum.  He complains of associated nausea but no  vomiting.  He feels full and bloated.  He denies any heartburn, esophageal  dysphagia or odynophagia.  He has never had a colonoscopy.  The patient  complains of rectal irritation when he strains with defecation.   CURRENT MEDICATIONS:  1. Asacor 1,000 mg daily.  2. Altace 5 mg daily.  3. Bumex 2 mg daily.  4. Toprol 50 mg daily.  5. Potassium 10 mEq daily.  6. Ecotrin 325 mg daily.  7. MiraLax 17 g daily.  8. Naprosyn 500 mg daily.   ALLERGIES:  1. PENICILLIN.  2. MOTRIN.    PAST MEDICAL HISTORY:  1. Arthritis.  2. Hypercholesterolemia.  3. Hypertension.   PAST SURGICAL HISTORY:  1. Cervical disk surgery in July 2003.  2. Lumbar back surgery.  3. Cardiac stent five years ago.  4. Right neck surgery by Dr. Lazarus Salines, specifics unclear.  5. Pilonidal cyst.   FAMILY HISTORY:  Mother died of a MI.  Father died of lung cancer.  No  family history of colorectal cancer or chronic GI illness.   SOCIAL HISTORY:  He has been married x15 years, has no children.  He is  disabled.  He quit smoking 11 years ago.  Denies any alcohol use.   REVIEW OF SYSTEMS:  Please see  HPI.  GENERAL:  Denies any weight loss.  CARDIOPULMONARY:  Denies any chest pain, shortness of breath.  Complains of  lower extremities edema.   PHYSICAL EXAMINATION:  VITAL SIGNS:  Weight 276, height 6'1, temperature  97.6, blood pressure 110/70, pulse 84.  GENERAL:  This is a pleasant, well-nourished, well-developed Caucasian male  in no acute distress.  SKIN:  Warm and dry.  No jaundice.  HEENT:  Conjunctivae are pink.  Sclerae are nonicteric.  Oropharyngeal  mucosa is moist and pink.  No lesions, erythema or exudate.  No  lymphadenopathy, thyromegaly.  CHEST:  Lungs are clear to auscultation.  CARDIAC:  Reveals a regular, rate and rhythm.  Normal S1, S2.  No murmurs,  rubs or gallops.  ABDOMEN:  Positive bowel sounds.  Obese but symmetrical.  Soft, nontender.  No organomegaly or masses.  EXTREMITIES:  Pedal edema 1+ bilaterally.   IMPRESSION:  1. Chronic worsening constipation requiring laxative use.  The patient has     never had a colonoscopy, he needs to have one now for further evaluation     of the symptoms.  Suspect that his constipation is functional.  He is     less active since his surgery.  2. Intermittent hematochezia, most likely from a benign anorectal source.  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   PLAN:  1. Colonoscopy in the near future.  Will obtain a TSH and serum calcium     level at the time of endoscopy.  2. Will hold aspirin five days prior to procedure.  3.     Trial of Analpram 2.5% cream to apply t.i.d. p.r.n. rectal irritation, two     sample tubes provided.  4. For now he will discontinue MiraLax as it seems to not be helping.  May     continue to use magnesium citrate or Correctol until time of procedure     p.r.n.     Tiffany Kocher, P.A.-C                  Lionel December, M.D.    LSL/MEDQ  D:  02/05/2003  T:  02/05/2003  Job:  [id number]   cc:   Patrica Duel, M.D.  [address]  [place]  [place] [address]  Fax: [phone]

## 2010-11-06 NOTE — Cardiovascular Report (Signed)
NAME:  Maurice Munoz, Maurice Munoz NO.:  [id number]   MEDICAL RECORD NO.:  [id number]          PATIENT TYPE:  OIB   LOCATION:  6522                         FACILITY:  [facility]   PHYSICIAN:  [name], M.D.     DATE OF BIRTH:  1945/07/21   DATE OF PROCEDURE:  10/11/2005  DATE OF DISCHARGE:  10/12/2005                              CARDIAC CATHETERIZATION   PROCEDURE:  Two-vessel percutaneous coronary intervention of the left  anterior descending artery and right coronary artery.   INDICATIONS:  Mr. Kei Mcelhiney is a 65 year old gentleman with known  coronary artery disease. He is status post PTCA of his proximal right and  PDA vessel in 1999. He had recently been found at catheterization October 06, 2005, to have developed a tubular 80% to 85% stenosis in the mid LAD, as  well as 85% eccentric stenosis of the distal RCA beyond the crux but prior  to the PDA takeoff. He was hydrated, started on Plavix and additional  antianginal medication, and referred for coronary intervention of his LAD  and right coronary artery.   PROCEDURE:  After premedication with Valium 5 mg intravenously, the patient  was prepped and draped in the usual fashion. His right femoral artery was  punctured anteriorly and a 6-French sheath was inserted. Double-bolus  Integrilin and 5800 units of heparin was administered. The patient had been  started on Plavix since October 06, 2005, and received an additional 150 mg  load at the start of the procedure. With demonstration of documented  anticoagulation, coronary intervention was then done with attention directed  initially at the LAD system. An FL-4 6-French guide was used. A Forte marker  wire was advanced down the LAD. Primary stenting in the mid LAD just after a  moderate size diagonal vessel was done with a 3.5 x 13 mm drug-eluting  Cypher stent deployed x2 to 16 atmospheres. Post stent dilatation was done  utilizing a 3.75 x 12 mm Quantum balloon. Scout  angiography confirmed an  excellent angiographic result with the 80-85% stenosis being reduced to 0%.   Attention was then directed at the right coronary artery. A 6-French FR-4  guide was used. The same Forte wire was advanced down the RCA. Primary  stenting was done at the 85% eccentric stenosis beyond the crux and before  the PDA takeoff with a 3.0 x 13 mm drug-eluting Cypher stent. Post stent  dilatation was done with a 3.25 x 12 mm Quantum balloon. Scout scout  angiography recent revealed an excellent angiographic result. The patient  tolerated procedure well. He did receive 25 mg of fentanyl towards the end  of the procedure. He received several doses of intracoronary nitroglycerin  and also was started on a low-dose IV nitroglycerin drip. He tolerated the  procedure well and returned to hi room in satisfactory condition.   HEMODYNAMIC DATA:  Central aortic pressure was 121/72, mean 92.   ANGIOGRAPHIC DATA:  The left main coronary artery was angiographically  normal and bifurcated in the LAD and left circumflex system. As was  previously demonstrated on the diagnostic catheterization, the LAD  in its  mid section after takeoff of a moderate sized diagonal vessel had tubular  narrowing of 85% and was smooth. There was 50% ostial narrowing of this  diagonal vessel proximal to the LAD stenosis. The distal LAD was a large  caliber. Following successful primary stenting with a 3.5 x 13 mm Cypher  stent postdilated to 3.75 mm, the 85% stenosis was reduced to 0%.   The right coronary artery was moderate size vessel that had 85% eccentric  stenosis just beyond the crux prior to the takeoff of the PDA and  continuation branch of the RCA. There was smooth narrowing of the PDA of 40%  as well as 40-50% narrowing in the continuation branch of the RCA beyond the  PDA takeoff. Following primary stenting of the 85% stenosis with a 3.0 x 13  mm Cypher stent postdilated to 3.25 mm, the 85% stenosis  was reduced to 0%.  There is TIMI 3 flow.  There was no evidence for dissection. There was no  change in the previous PDA and distal RCA lesions which were not intervened  upon.   IMPRESSION:  Successful two-vessel stenting of the mid left anterior  descending coronary artery and right coronary artery, with the mid left  anterior descending coronary artery being reduced from 85% to 0% (3.5 x 13  mm Cypher stent postdilated to 3.75 mm) and the right coronary artery being  reduced from 85% to 0% (3.0 x 13 mm Cypher stent postdilated to 3.25 mm),  done with double-bolus Integrilin/weight adjusted heparinization.           ______________________________  [name], M.D.     TK/MEDQ  D:  10/11/2005  T:  10/12/2005  Job:  [id number]   cc:   Ulice Dash, M.D.  Fax: [phone]   Patrica Duel, M.D.  Fax: [phone]

## 2010-11-06 NOTE — Discharge Summary (Signed)
NAMEWILBON, Maurice Munoz NO.:  [id number]   MEDICAL RECORD NO.:  [id number]          PATIENT TYPE:  OIB   LOCATION:  6522                         FACILITY:  [facility]   PHYSICIAN:  [name], M.D.     DATE OF BIRTH:  05-26-1946   DATE OF ADMISSION:  10/11/2005  DATE OF DISCHARGE:  10/12/2005                                 DISCHARGE SUMMARY   DISCHARGE DIAGNOSIS:  1.  Coronary disease status post elective left anterior descending  and      right coronary artery stenting this admission.  2.  Previous percutaneous transluminal coronary angiography to his right      coronary artery and posterior descending artery in 1999 with recent      outpatient abnormal stress test followed by outpatient cath.  3.  Normal left ventricular function.  4.  Dyslipidemia  5.  Treated hypertension  6.  Chronic neck pain from cervical disk disease.   HOSPITAL COURSE:  The patient is a 65 year old male followed by Dr. Tresa Endo  who has known coronary disease. He had a PTCA in 1999. He had been doing  well from a cardiac standpoint. He had had some increasing weakness and  dyspnea on exertion and underwent a Cardiolite study as an outpatient that  showed a new anterior perfusion abnormality which was not present  previously. Ejection fraction was preserved at 62%. He was set up for  diagn[facility] and this  revealed an 85% distal RCA and 80% mid LAD with normal LV function, normal  renal arteries, and normal iliacs. He was admitted for elective intervention  of the LAD and RCA which was done October 11, 2005, using Cypher stents. He  tolerated this well. We feel he can be discharged on October 12, 2005.  He  will see Dr. Tresa Endo in a few weeks as an outpatient.   LABORATORY DATA:  INR is 1.0. Sodium of 139, potassium 2.9, BUN 9,  creatinine 1.1. White count 6, hemoglobin 12.6, hematocrit 35.9, platelets  188. CK is 386 with an MB of 10. EKG shows sinus  rhythm without acute  changes.   DISPOSITION:  The patient is discharged stable condition and will follow-up  with Dr. Tresa Endo in two weeks in the office.   DISCHARGE MEDICATIONS:  Advicor 1000/20 once a day, Plavix 75 mg a day,  coated aspirin once a day, Altace 10 mg a day, Toprol XL 50 mg, Duragesic  patch as taken at home, Imdur 60 mg a day, nitroglycerin sublingual p.r.n.  In the future, the patient has requested changes we change his medication to  generic where possible.      Maurice Munoz, P.A.    ______________________________  [name], M.D.    [name]  D:  10/12/2005  T:  10/12/2005  Job:  [id number]   cc:   Patrica Duel, M.D.  Fax: [phone]

## 2010-11-06 NOTE — Op Note (Signed)
NAME:  Maurice Munoz, Maurice Munoz                        ACCOUNT NO.:  [id number]   MEDICAL RECORD NO.:  [id number]                   PATIENT TYPE:  AMB   LOCATION:  DAY                                  FACILITY:  [facility]   PHYSICIAN:  Lionel December, M.D.                 DATE OF BIRTH:  [birth year]   DATE OF PROCEDURE:  02/20/2003  DATE OF DISCHARGE:                                 OPERATIVE REPORT   PROCEDURE:  Total colonoscopy.   INDICATIONS:  Kaelyn is a 65 year old Caucasian male with intermittent  hematochezia.  He also has constipation not responding well to therapy.  His  TSH and calcium are pending.  The patient tells me that he had chills and  fever yesterday but he is afebrile this morning and he does not have any  symptoms other than chronic neck and back problems.  I proceeded to check a  CBC, which is still pending.  The procedure risks were reviewed with the  patient.  Informed consent was obtained.   PREMEDICATION:  Demerol 50 mg IV, Versed 6 mg IV in divided dose.   FINDINGS:  Procedure performed in endoscopy suite.  The patient's vital  signs and O2 saturation were monitored during procedure and remained stable.  The patient was placed in the left lateral recumbent position and rectal  examination performed.  He had some discomfort on digital exam, but there  was no stenosis or mass noted.  The Olympus video scope was placed in the  rectum and advanced under vision in sigmoid colon and beyond.  Preparation  was excellent.  Somewhat redundant colon but the scope was advanced to the  cecum, which was identified by ileocecal valve and appendiceal orifice.  Pictures taken for the record.  As the scope was withdrawn the colonic  mucosa was once again carefully examined and was normal throughout.  No  diverticula were noted.  The scope was retroflexed to examine the anorectal  junction, and hemorrhoids were noted below the dentate line, which were  small to moderate in size.   Endoscope was straightened and withdrawn.  The  patient tolerated the procedure well.   FINAL DIAGNOSIS:  Moderate-sized external hemorrhoids, otherwise normal  colonoscopy.  I feel this is the source of his hematochezia.   RECOMMENDATIONS:  1. He will continue Metamucil one tablespoonful b.i.d., Miralax 17 g daily,     and will start him on Zelnorm 6 mg p.o. b.i.d.  2.     He will keep a diary as to stool frequency and consistency and return for OV     in four to six weeks from now.  3. I will contact the patient with the results of serum calcium, TSH, and     CBC.  Should he experience fever with chills again, he will need to     immediately come to ER.  Lionel December, M.D.    NR/MEDQ  D:  02/20/2003  T:  02/21/2003  Job:  [id number]   cc:   Patrica Duel, M.D.  [address], [address]  [place]  [place] [address]  Fax: [phone]

## 2011-02-24 ENCOUNTER — Other Ambulatory Visit (HOSPITAL_COMMUNITY): Payer: Self-pay | Admitting: Family Medicine

## 2011-02-24 DIAGNOSIS — E785 Hyperlipidemia, unspecified: Secondary | ICD-10-CM

## 2011-02-24 DIAGNOSIS — M549 Dorsalgia, unspecified: Secondary | ICD-10-CM

## 2011-02-24 DIAGNOSIS — I10 Essential (primary) hypertension: Secondary | ICD-10-CM

## 2011-03-01 ENCOUNTER — Ambulatory Visit (HOSPITAL_COMMUNITY)
Admission: RE | Admit: 2011-03-01 | Discharge: 2011-03-01 | Disposition: A | Discharge status: Home / Self Care | Payer: Medicare Other | Source: Ambulatory Visit | Attending: Family Medicine | Admitting: Family Medicine

## 2011-03-01 DIAGNOSIS — E785 Hyperlipidemia, unspecified: Secondary | ICD-10-CM

## 2011-03-01 DIAGNOSIS — M549 Dorsalgia, unspecified: Secondary | ICD-10-CM

## 2011-03-01 DIAGNOSIS — M5126 Other intervertebral disc displacement, lumbar region: Secondary | ICD-10-CM | POA: Insufficient documentation

## 2011-03-01 DIAGNOSIS — M545 Low back pain, unspecified: Secondary | ICD-10-CM | POA: Insufficient documentation

## 2011-03-01 DIAGNOSIS — I10 Essential (primary) hypertension: Secondary | ICD-10-CM | POA: Insufficient documentation

## 2011-03-01 DIAGNOSIS — M25559 Pain in unspecified hip: Secondary | ICD-10-CM | POA: Insufficient documentation

## 2011-03-01 DIAGNOSIS — M79609 Pain in unspecified limb: Secondary | ICD-10-CM | POA: Insufficient documentation

## 2012-07-26 ENCOUNTER — Other Ambulatory Visit (HOSPITAL_COMMUNITY): Payer: Self-pay | Admitting: Cardiovascular Disease

## 2012-07-26 DIAGNOSIS — I2510 Atherosclerotic heart disease of native coronary artery without angina pectoris: Secondary | ICD-10-CM

## 2012-08-23 ENCOUNTER — Ambulatory Visit (HOSPITAL_COMMUNITY)
Admission: RE | Admit: 2012-08-23 | Discharge: 2012-08-23 | Disposition: A | Discharge status: Home / Self Care | Payer: Medicare Other | Source: Ambulatory Visit | Attending: Cardiovascular Disease | Admitting: Cardiovascular Disease

## 2012-08-23 DIAGNOSIS — I2510 Atherosclerotic heart disease of native coronary artery without angina pectoris: Secondary | ICD-10-CM | POA: Insufficient documentation

## 2012-08-23 MED ORDER — TECHNETIUM TC 99M SESTAMIBI GENERIC - CARDIOLITE
30.4000 | Freq: Once | INTRAVENOUS | Status: AC | PRN
  Administered 2012-08-23: 30 via INTRAVENOUS

## 2012-08-23 MED ORDER — TECHNETIUM TC 99M SESTAMIBI GENERIC - CARDIOLITE
10.3000 | Freq: Once | INTRAVENOUS | Status: AC | PRN
  Administered 2012-08-23: 10 via INTRAVENOUS

## 2012-08-23 MED ORDER — REGADENOSON 0.4 MG/5ML IV SOLN
0.4000 mg | Freq: Once | INTRAVENOUS | Status: AC
  Administered 2012-08-23: 0.4 mg via INTRAVENOUS

## 2012-08-23 NOTE — Procedures (Addendum)
[facility]  [facility] [address] [place] [address] [phone]  Cardiology Nuclear Med Study  Maurice Munoz is a 67 y.o. male     MRN : [id number]     DOB: [birth year]  Procedure Date: 08/23/2012  Nuclear Med Background Indication for Stress Test:  Stent Patency and PTCA Patency History:  CAD;STENT/PTCA-10/11/2005 Cardiac Risk Factors: Family History - CAD, History of Smoking, Lipids and Obesity  Symptoms:  DOE and Fatigue   Nuclear Pre-Procedure Caffeine/Decaff Intake:  9:00pm NPO After: 7:00am   IV Site: R Forearm  IV 0.9% NS with Angio Cath:  22g  Chest Size (in):  50" IV Started by: Emmit Pomfret, RN  Height: 6\' 1"  (1.854 m)  Cup Size: n/a  BMI:  Body mass index is 33.78 kg/(m^2). Weight:  256 lb (116.121 kg)   Tech Comments:  N/A    Nuclear Med Study 1 or 2 day study: 1 day  Stress Test Type:  Lexiscan  Order Authorizing Provider:  Benny Lennert   Resting Radionuclide: Technetium 37m Sestamibi  Resting Radionuclide Dose: 10.3 mCi   Stress Radionuclide:  Technetium 21m Sestamibi  Stress Radionuclide Dose: 30.4 mCi           Stress Protocol Rest HR: 57 Stress HR: 95  Rest BP: 134/74 Stress BP:139/57  Exercise Time (min): n/a METS: n/a          Dose of Adenosine (mg):  n/a Dose of Lexiscan: 0.4 mg  Dose of Atropine (mg): n/a Dose of Dobutamine: n/a mcg/kg/min (at max HR)  Stress Test Technologist: Ernestene Mention, CCT Nuclear Technologist: Gonzella Lex, CNMT   Rest Procedure:  Myocardial perfusion imaging was performed at rest 45 minutes following the intravenous administration of Technetium 15m Sestamibi. Stress Procedure:  The patient received IV Lexiscan 0.4 mg over 15-seconds.  Technetium 8m Sestamibi injected at 30-seconds.  There were no significant changes with Lexiscan.  Quantitative spect images were obtained after a 45 minute delay.  Transient Ischemic Dilatation (Normal <1.22):  0.92 Lung/Heart  Ratio (Normal <0.45):  0.36 QGS EDV:  89 ml QGS ESV:  31 ml LV Ejection Fraction: 65%  Signed by       Rest ECG: NSR - Normal EKG  Stress ECG: No significant change from baseline ECG  QPS Raw Data Images:  Normal; no motion artifact; normal heart/lung ratio. Stress Images:  Subtle decrease in perfusion anterolateral wall Rest Images:  Normal homogeneous uptake in all areas of the myocardium. Subtraction (SDS):  No evidence of ischemia.  Impression Exercise Capacity:  Lexiscan with no exercise. BP Response:  Normal blood pressure response. Clinical Symptoms:  No significant symptoms noted. ECG Impression:  No significant ST segment change suggestive of ischemia. Comparison with Prior Nuclear Study: No significant change  Overall Impression:  Low risk stress nuclear study.  LV Wall Motion:  Normal Wall Motion   Runell Gess, MD  08/23/2012 5:23 PM

## 2012-11-06 ENCOUNTER — Other Ambulatory Visit: Payer: Self-pay | Admitting: *Deleted

## 2012-11-06 MED ORDER — SIMVASTATIN 20 MG PO TABS: 20.0000 mg | ORAL_TABLET | Freq: Every day | ORAL | Status: DC

## 2012-11-27 ENCOUNTER — Ambulatory Visit (INDEPENDENT_AMBULATORY_CARE_PROVIDER_SITE_OTHER): Payer: Medicare Other | Admitting: Cardiovascular Disease

## 2012-11-27 ENCOUNTER — Encounter: Payer: Self-pay | Admitting: Cardiovascular Disease

## 2012-11-27 VITALS — BP 124/72 | HR 65 | Ht 72.0 in | Wt 269.7 lb

## 2012-11-27 DIAGNOSIS — R609 Edema, unspecified: Secondary | ICD-10-CM

## 2012-11-27 DIAGNOSIS — E669 Obesity, unspecified: Secondary | ICD-10-CM

## 2012-11-27 DIAGNOSIS — M5090 Cervical disc disorder, unspecified, unspecified cervical region: Secondary | ICD-10-CM

## 2012-11-27 DIAGNOSIS — I2510 Atherosclerotic heart disease of native coronary artery without angina pectoris: Secondary | ICD-10-CM

## 2012-11-27 NOTE — Progress Notes (Signed)
Patient ID: Maurice Munoz, male   DOB: 07/21/1945, 67 y.o.   MRN: [id number]     HPI: Maurice Munoz, is a 67 y.o. male who presents to the office today for cardiology followup evaluation. He is scheduled to undergo cervical disc surgery on C2-C3 by Dr. Channing Mutters at [facility] on 12/11/2012  Maurice Munoz has established coronary disease dating back to 1999 when he underwent intervention to his RCA and PDA. In April 2007 he underwent stenting to his LAD and distal right coronary artery at which time Cypher DES stents were inserted. In the past he has tolerated that surgery by Dr. Orlinda Blalock well from a cardiovascular perspective. He is issues with hypertension, obesity, hyperlipidemia, gout, and in addition has had some increasing lower extremity edema. When he was seen several months ago he was advised to increase his torsemide to 20 mg twice a day for 2 days and then take 20 mg daily. This did result in significant improvement in his leg swelling. He states recently he has started to take Naprosyn again and has noticed more leg swelling. He denies chest pain. He denies PND orthopnea. He denies wheezing. He is to undergo cervical disc disease surgery in several weeks.   Maurice Munoz did undergo a two-year followup nuclear perfusion study in March 2014 which was essentially unchanged from his prior study and continued to show fairly normal perfusion and was low risk, without evidence for scar. There was no significant ischemia.  Past Medical History  Diagnosis Date  . CAD (coronary artery disease) 1999  . DDD (degenerative disc disease)   . HTN (hypertension)   . Obesity   . Hyperlipidemia   . Gout   . Peripheral edema     Past Surgical History  Procedure Laterality Date  . Back surgery      r/t DDD  . Cardiac catheterization      RCA, PDA  . Cardiac catheterization  april 2007    stenting LAD, distal RCA    Allergies  Allergen Reactions  . Ibuprofen   . Penicillins      Current Outpatient Prescriptions  Medication Sig Dispense Refill  . allopurinol (ZYLOPRIM) 300 MG tablet daily.      Marland Kitchen aspirin 81 MG tablet Take 81 mg by mouth daily.      . clopidogrel (PLAVIX) 75 MG tablet       . isosorbide mononitrate (IMDUR) 30 MG 24 hr tablet Take 30 mg by mouth daily.      Marland Kitchen KLOR-CON M20 20 MEQ tablet as needed.       Marland Kitchen lisinopril (PRINIVIL,ZESTRIL) 20 MG tablet Take 10 mg by mouth daily. 1/2 tab daily      . METOPROLOL SUCCINATE ER PO Take 50 mg by mouth 2 (two) times daily.      Marland Kitchen oxyCODONE-acetaminophen (PERCOCET) 10-325 MG per tablet as needed.       . rosuvastatin (CRESTOR) 10 MG tablet Take 10 mg by mouth daily.      . simvastatin (ZOCOR) 20 MG tablet Take 1 tablet (20 mg total) by mouth at bedtime.  30 tablet  6  . SSD 1 % cream Apply 1 application topically as needed.      Marland Kitchen tiZANidine (ZANAFLEX) 4 MG tablet 3 (three) times daily as needed.      . torsemide (DEMADEX) 20 MG tablet as needed.      . vitamin B-12 (CYANOCOBALAMIN) 1000 MCG tablet Take 1,000 mcg by mouth daily.  No current facility-administered medications for this visit.    Socially he is married. There are no children. Has a remote tobacco history having quit approximately 16 years ago. He's not very active. He does not routinely exercise.  ROS is notable for weight loss with edema improvement but again he has noticed some currents of his leg swelling. He denies PND or orthopnea. He denies presyncope or syncope. He denies chest pressure. He denies bleeding. He denies change in bowel or bladder habits. At times he notes some discomfort under his left ribs appear other system review is negative.  PE BP 124/72  Pulse 65  Ht 6' (1.829 m)  Wt 269 lb 11.2 oz (122.335 kg)  BMI 36.57 kg/m2  General: Alert, oriented, no distress.  HEENT: Normocephalic, atraumatic. Pupils round and reactive; sclera anicteric;no lid lag,  Nose without nasal septal hypertrophy Mouth/Parynx benign;  Mallinpatti scale 3 Neck: Thick neck ;No JVD, no carotid briuts Lungs: clear to ausculatation and percussion; no wheezing or rales Heart: RRR, s1 s2 normal 1/6 systolic murmur. Abdomen: soft, nontender; no hepatosplenomehaly, BS+; abdominal aorta nontender and not dilated by palpation. Pulses 2+ Extremities: Legs 1+ somewhat tense edema ankles and  pretibial region bilaterally; no clubbing cyanosis; Homan's sign negative  Neurologic: grossly nonfocal  ECG: Normal sinus rhythm at 65 beats per minute. No significant ST abnormalities. Normal intervals per  LABS:  BMET    Component Value Date/Time   NA 138 02/12/2010 0507   K 3.6 02/12/2010 0507   CL 104 02/12/2010 0507   CO2 28 02/12/2010 0507   GLUCOSE 132* 02/12/2010 0507   BUN 29* 02/12/2010 0507   CREATININE 1.42 02/12/2010 0507   CALCIUM 8.8 02/12/2010 0507   GFRNONAA 51* 02/12/2010 0507   GFRAA  Value: >60        The eGFR has been calculated using the MDRD equation. This calculation has not been validated in all clinical situations. eGFR's persistently <60 mL/min signify possible Chronic Kidney Disease. 02/12/2010 0507     Hepatic Function Panel     Component Value Date/Time   PROT 6.1 02/11/2010 0523   ALBUMIN 3.5 02/11/2010 0523   AST 24 02/11/2010 0523   ALT 22 02/11/2010 0523   ALKPHOS 58 02/11/2010 0523   BILITOT 1.1 02/11/2010 0523   BILIDIR 0.2 09/15/2009 0524   IBILI 1.0* 09/15/2009 0524     CBC    Component Value Date/Time   WBC 10.6* 02/12/2010 0507   RBC 4.75 02/12/2010 0507   HGB 13.8 02/12/2010 0507   HCT 41.1 02/12/2010 0507   PLT 207 02/12/2010 0507   MCV 86.5 02/12/2010 0507   MCH 29.0 02/12/2010 0507   MCHC 33.6 02/12/2010 0507   RDW 14.0 02/12/2010 0507   LYMPHSABS 1.7 02/12/2010 0507   MONOABS 0.9 02/12/2010 0507   EOSABS 0.1 02/12/2010 0507   BASOSABS 0.0 02/12/2010 0507     BNP    Component Value Date/Time   PROBNP 34.0 09/14/2009 1427    Lipid Panel     Component Value Date/Time   CHOL  Value: 131         ATP III CLASSIFICATION:  <200     mg/dL   Desirable  161-096  mg/dL   Borderline High  >=045    mg/dL   High        09/27/8117 0524   TRIG 94 09/15/2009 0524   HDL 31* 09/15/2009 0524   CHOLHDL 4.2 09/15/2009 0524   VLDL 19 09/15/2009 0524  LDLCALC  Value: 81        Total Cholesterol/HDL:CHD Risk Coronary Heart Disease Risk Table                     Men   Women  1/2 Average Risk   3.4   3.3  Average Risk       5.0   4.4  2 X Average Risk   9.6   7.1  3 X Average Risk  23.4   11.0        Use the calculated Patient Ratio above and the CHD Risk Table to determine the patient's CHD Risk.        ATP III CLASSIFICATION (LDL):  <100     mg/dL   Optimal  161-096  mg/dL   Near or Above                    Optimal  130-159  mg/dL   Borderline  045-409  mg/dL   High  >811     mg/dL   Very High 03/04/7828 5621     RADIOLOGY: No results found.    ASSESSMENT AND PLAN: From a cardiac standpoint, Mr. Deguia is remaining relatively stable. However, he continues to have peripheral edema. Suspect recently this has been exacerbated by his Naprosyn use I recommended he can take torsemide 20 twice a day today and tomorrow and then he may need to take an extra 20 mg on a when necessary basis in the afternoon he does note recurrence of leg swelling. I have recommended he discontinue his Naprosyn. I reviewed his nuclear perfusion study. I am giving him clearance for his cervical neck surgery to be done by Dr. Channing Mutters at [facility] on 12/11/2012. I again discussed the importance of weight loss. I will see him in 4 months for followup evaluation.     Lennette Bihari, MD, [facility]  11/27/2012 12:17 PM

## 2012-11-27 NOTE — Patient Instructions (Addendum)
Your physician has recommended you make the following change in your medication: take 2 tablets Torsemide today and tomorrow and twice a day from then on AS NEEDED.   Dr. Tresa Endo has given cardiac clearance for your upcoming surgery.   Your physician recommends that you schedule a follow-up appointment in: 4 months

## 2013-03-28 ENCOUNTER — Ambulatory Visit: Payer: Medicare Other | Admitting: Cardiovascular Disease

## 2013-03-29 ENCOUNTER — Encounter: Payer: Self-pay | Admitting: Cardiovascular Disease

## 2013-03-29 ENCOUNTER — Ambulatory Visit (INDEPENDENT_AMBULATORY_CARE_PROVIDER_SITE_OTHER): Payer: Medicare Other | Admitting: Cardiovascular Disease

## 2013-03-29 VITALS — BP 100/74 | HR 63 | Ht 72.0 in | Wt 258.7 lb

## 2013-03-29 DIAGNOSIS — M5090 Cervical disc disorder, unspecified, unspecified cervical region: Secondary | ICD-10-CM

## 2013-03-29 DIAGNOSIS — R609 Edema, unspecified: Secondary | ICD-10-CM

## 2013-03-29 DIAGNOSIS — I2510 Atherosclerotic heart disease of native coronary artery without angina pectoris: Secondary | ICD-10-CM

## 2013-03-29 DIAGNOSIS — E669 Obesity, unspecified: Secondary | ICD-10-CM

## 2013-03-29 NOTE — Progress Notes (Signed)
Patient ID: Maurice Munoz, male   DOB: 1946/02/18, 67 y.o.   MRN: [id number]     HPI: Maurice Munoz, is a 67 y.o. male who presents to the office today for cardiology followup evaluation.   Since I last saw him he underwent cervical disc surgery on C2-C3 by Dr. Channing Mutters at [facility] in June 2014 and tolerated this well from a cardiovascular standpoint.  Maurice Munoz has established coronary disease dating back to 1999 when he underwent intervention to his RCA and PDA. In April 2007 he underwent stenting to his LAD and distal right coronary artery at which time Cypher DES stents were inserted. He is issues with hypertension, obesity, hyperlipidemia, gout, and in addition has had some increasing lower extremity edema. When he was seen several months ago he was advised to increase his torsemide to 20 mg twice a day for 2 days and then take 20 mg daily. This did result in significant improvement in his leg swelling. When in the past he had started to take Naprosyn again, he noticed more leg swelling. He denies chest pain. He denies PND orthopnea. He denies wheezing.    Maurice Munoz did undergo a two-year followup nuclear perfusion study in March 2014 which was essentially unchanged from his prior study and continued to show fairly normal perfusion and was low risk, without evidence for scar. There was no significant ischemia.  Over the past several months, Maurice Munoz has continued to be stable. He denies chest pain. At times he still takes Naprosyn intermittently and when he does there is still some swelling. Typically when he wakes up the swelling is gone but as the day progresses he does note recurrence of lower chimney edema. He denies chest pressure. He denies palpitations. He has not been very active.  Past Medical History  Diagnosis Date  . CAD (coronary artery disease) 1999  . DDD (degenerative disc disease)   . HTN (hypertension)   . Obesity   . Hyperlipidemia   . Gout   .  Peripheral edema     Past Surgical History  Procedure Laterality Date  . Back surgery      r/t DDD  . Cardiac catheterization      RCA, PDA  . Cardiac catheterization  april 2007    stenting LAD, distal RCA    Allergies  Allergen Reactions  . Ibuprofen   . Penicillins     Current Outpatient Prescriptions  Medication Sig Dispense Refill  . allopurinol (ZYLOPRIM) 300 MG tablet daily.      Marland Kitchen aspirin 81 MG tablet Take 81 mg by mouth daily.      . clopidogrel (PLAVIX) 75 MG tablet       . isosorbide mononitrate (IMDUR) 30 MG 24 hr tablet Take 30 mg by mouth daily.      Marland Kitchen lisinopril (PRINIVIL,ZESTRIL) 20 MG tablet Take 10 mg by mouth daily.       Marland Kitchen METOPROLOL SUCCINATE ER PO Take 50 mg by mouth 2 (two) times daily.      Marland Kitchen oxyCODONE-acetaminophen (PERCOCET) 10-325 MG per tablet as needed.       . simvastatin (ZOCOR) 20 MG tablet Take 1 tablet (20 mg total) by mouth at bedtime.  30 tablet  6  . tiZANidine (ZANAFLEX) 4 MG tablet 3 (three) times daily as needed.      . torsemide (DEMADEX) 20 MG tablet 20 mg daily.       . vitamin B-12 (CYANOCOBALAMIN) 1000 MCG tablet Take  1,000 mcg by mouth daily.       No current facility-administered medications for this visit.    Socially he is married. There are no children. Has a remote tobacco history having quit approximately 16 years ago. He's not very active. He does not routinely exercise.  ROS is negative for fever chills or night sweats. He denies visual changes. He denies PND or orthopnea. He denies presyncope or syncope. He denies chest pressure. He denies bleeding. He denies change in bowel or bladder habits. He denies claudication symptoms. He denies paresthesias. He does have hyperlipidemia. He denies anginal symptoms. He denies excessive bleeding. He has not had any recent gout flares. Other comprehensive 12 point system review is negative.  PE BP 100/74  Pulse 63  Ht 6' (1.829 m)  Wt 258 lb 11.2 oz (117.346 kg)  BMI 35.08 kg/m2    General: Alert, oriented, no distress.  HEENT: Normocephalic, atraumatic. Pupils round and reactive; sclera anicteric;no lid lag,  Nose without nasal septal hypertrophy Mouth/Parynx benign; Mallinpatti scale 3 Neck: Thick neck ;No JVD, no carotid briuts Lungs: clear to ausculatation and percussion; no wheezing or rales Heart: RRR, s1 s2 normal 1/6 systolic murmur. Abdomen: soft, nontender; no hepatosplenomehaly, BS+; abdominal aorta nontender and not dilated by palpation. Pulses 2+ Extremities:  pretibial trace to 1+ edema bilaterally; no clubbing cyanosis; Homan's sign negative  Neurologic: grossly nonfocal  ECG: Normal sinus rhythm at 63 beats per minute. No significant ST abnormalities. Normal intervals.  LABS:  BMET    Component Value Date/Time   NA 138 02/12/2010 0507   K 3.6 02/12/2010 0507   CL 104 02/12/2010 0507   CO2 28 02/12/2010 0507   GLUCOSE 132* 02/12/2010 0507   BUN 29* 02/12/2010 0507   CREATININE 1.42 02/12/2010 0507   CALCIUM 8.8 02/12/2010 0507   GFRNONAA 51* 02/12/2010 0507   GFRAA  Value: >60        The eGFR has been calculated using the MDRD equation. This calculation has not been validated in all clinical situations. eGFR's persistently <60 mL/min signify possible Chronic Kidney Disease. 02/12/2010 0507     Hepatic Function Panel     Component Value Date/Time   PROT 6.1 02/11/2010 0523   ALBUMIN 3.5 02/11/2010 0523   AST 24 02/11/2010 0523   ALT 22 02/11/2010 0523   ALKPHOS 58 02/11/2010 0523   BILITOT 1.1 02/11/2010 0523   BILIDIR 0.2 09/15/2009 0524   IBILI 1.0* 09/15/2009 0524     CBC    Component Value Date/Time   WBC 10.6* 02/12/2010 0507   RBC 4.75 02/12/2010 0507   HGB 13.8 02/12/2010 0507   HCT 41.1 02/12/2010 0507   PLT 207 02/12/2010 0507   MCV 86.5 02/12/2010 0507   MCH 29.0 02/12/2010 0507   MCHC 33.6 02/12/2010 0507   RDW 14.0 02/12/2010 0507   LYMPHSABS 1.7 02/12/2010 0507   MONOABS 0.9 02/12/2010 0507   EOSABS 0.1 02/12/2010 0507   BASOSABS 0.0  02/12/2010 0507     BNP    Component Value Date/Time   PROBNP 34.0 09/14/2009 1427    Lipid Panel     Component Value Date/Time   CHOL  Value: 131        ATP III CLASSIFICATION:  <200     mg/dL   Desirable  161-096  mg/dL   Borderline High  >=045    mg/dL   High        09/27/8117 0524   TRIG 94 09/15/2009 0524  HDL 31* 09/15/2009 0524   CHOLHDL 4.2 09/15/2009 0524   VLDL 19 09/15/2009 0524   LDLCALC  Value: 81        Total Cholesterol/HDL:CHD Risk Coronary Heart Disease Risk Table                     Men   Women  1/2 Average Risk   3.4   3.3  Average Risk       5.0   4.4  2 X Average Risk   9.6   7.1  3 X Average Risk  23.4   11.0        Use the calculated Patient Ratio above and the CHD Risk Table to determine the patient's CHD Risk.        ATP III CLASSIFICATION (LDL):  <100     mg/dL   Optimal  161-096  mg/dL   Near or Above                    Optimal  130-159  mg/dL   Borderline  045-409  mg/dL   High  >811     mg/dL   Very High 03/04/7828 5621     RADIOLOGY: No results found.    ASSESSMENT AND PLAN: From a cardiac standpoint, Maurice Munoz continues to do fairly well. His blood pressure is well-controlled on current therapy. He's not having any palpitations. He is not having any gout episodes with allopurinol. He still has mild leg edema. I did suggest he can take an extra 20 mg torsemide on an as-needed basis. We also discussed sodium restriction. We also discussed trying to reduce the nonsteroidal anti-inflammatory medication if possible. I am recommending support stockings be obtained and worn for additional extrinsic leg compression. We discussed the importance of weight loss. He does have moderate obesity with a body mass index of 35.08. He's not having any anginal symptoms. I will see him in 6 months for followup evaluation.   Lennette Bihari, MD, [facility]  03/29/2013 11:46 AM

## 2013-03-29 NOTE — Patient Instructions (Signed)
Your physician recommends that you schedule a follow-up appointment in: 6 months  Take extra torsemide if the is increase swelling and use support stockings

## 2013-04-16 ENCOUNTER — Other Ambulatory Visit: Payer: Self-pay | Admitting: *Deleted

## 2013-04-16 MED ORDER — LISINOPRIL 20 MG PO TABS: 10.0000 mg | ORAL_TABLET | Freq: Every day | ORAL | Status: DC

## 2013-04-16 NOTE — Telephone Encounter (Signed)
Rx was sent to pharmacy electronically. 

## 2013-04-26 ENCOUNTER — Other Ambulatory Visit: Payer: Self-pay

## 2013-06-19 ENCOUNTER — Other Ambulatory Visit: Payer: Self-pay | Admitting: Cardiovascular Disease

## 2013-06-19 NOTE — Telephone Encounter (Signed)
Rx was sent to pharmacy electronically. 

## 2013-06-21 HISTORY — PX: LUMBAR LAMINECTOMY: SHX95

## 2013-07-16 ENCOUNTER — Other Ambulatory Visit (HOSPITAL_COMMUNITY): Payer: Self-pay | Admitting: Cardiovascular Disease

## 2013-07-16 DIAGNOSIS — I6529 Occlusion and stenosis of unspecified carotid artery: Secondary | ICD-10-CM

## 2013-07-17 ENCOUNTER — Ambulatory Visit (HOSPITAL_COMMUNITY)
Admission: RE | Admit: 2013-07-17 | Discharge: 2013-07-17 | Disposition: A | Discharge status: Home / Self Care | Payer: Medicare Other | Source: Ambulatory Visit | Attending: Cardiology | Admitting: Cardiology

## 2013-07-17 DIAGNOSIS — I658 Occlusion and stenosis of other precerebral arteries: Secondary | ICD-10-CM | POA: Insufficient documentation

## 2013-07-17 DIAGNOSIS — I6529 Occlusion and stenosis of unspecified carotid artery: Secondary | ICD-10-CM | POA: Insufficient documentation

## 2013-07-17 NOTE — Progress Notes (Signed)
Carotid Duplex Completed. °Brianna L Mazza,RVT °

## 2013-08-22 ENCOUNTER — Other Ambulatory Visit: Payer: Self-pay | Admitting: *Deleted

## 2013-08-22 ENCOUNTER — Telehealth: Payer: Self-pay | Admitting: Cardiovascular Disease

## 2013-08-22 MED ORDER — METOPROLOL TARTRATE 50 MG PO TABS: 50.0000 mg | ORAL_TABLET | Freq: Two times a day (BID) | ORAL | Status: DC

## 2013-08-22 MED ORDER — METOPROLOL SUCCINATE ER 50 MG PO TB24: 50.0000 mg | ORAL_TABLET | Freq: Two times a day (BID) | ORAL | Status: DC

## 2013-08-22 NOTE — Telephone Encounter (Signed)
Refills called in for Metoprolol ER 50mg  bid #60 w/5 refills to [facility].  Patient was in a hurry to get this Rx - that is the reason in was called and not sent electronically.

## 2013-08-22 NOTE — Telephone Encounter (Signed)
[facility] called - patient has been on lopressor 50mg  bid since 08/09/12.  Our records show metoprolol ER 50mg  bid.  Changed to the Tartrate in Epic and refills authorized w/[facility] w/ 2 refills.

## 2013-08-22 NOTE — Telephone Encounter (Signed)
Refills called in for Metoprolol ER 50mg bid #60 w/5 refills to [facility].  Patient was in a hurry to get this Rx - that is the reason in was called and not sent electronically. 

## 2013-08-22 NOTE — Telephone Encounter (Signed)
Pt is at the pharmacy to pick up his Metoprolol it is not ready. He left it on Monday and they have not heard back from [place].

## 2013-09-20 ENCOUNTER — Other Ambulatory Visit (HOSPITAL_COMMUNITY): Payer: Self-pay | Admitting: Cardiovascular Disease

## 2013-09-20 NOTE — Telephone Encounter (Signed)
Rx was sent to pharmacy electronically. 

## 2013-11-16 ENCOUNTER — Other Ambulatory Visit: Payer: Self-pay | Admitting: *Deleted

## 2013-11-16 MED ORDER — ISOSORBIDE MONONITRATE ER 30 MG PO TB24: 30.0000 mg | ORAL_TABLET | Freq: Every day | ORAL | Status: DC

## 2013-11-30 ENCOUNTER — Other Ambulatory Visit: Payer: Self-pay | Admitting: *Deleted

## 2013-12-07 ENCOUNTER — Other Ambulatory Visit: Payer: Self-pay | Admitting: *Deleted

## 2013-12-07 MED ORDER — ISOSORBIDE MONONITRATE ER 30 MG PO TB24: 30.0000 mg | ORAL_TABLET | Freq: Every day | ORAL | Status: DC

## 2013-12-07 NOTE — Telephone Encounter (Signed)
Rx was sent to pharmacy electronically. 

## 2013-12-10 ENCOUNTER — Other Ambulatory Visit: Payer: Self-pay

## 2013-12-10 MED ORDER — ISOSORBIDE MONONITRATE ER 30 MG PO TB24: 30.0000 mg | ORAL_TABLET | Freq: Every day | ORAL | Status: DC

## 2013-12-10 NOTE — Telephone Encounter (Signed)
Rx was sent to pharmacy electronically. 

## 2014-01-14 ENCOUNTER — Other Ambulatory Visit: Payer: Self-pay | Admitting: Cardiovascular Disease

## 2014-01-15 NOTE — Telephone Encounter (Signed)
Rx was sent to pharmacy electronically. 

## 2014-05-11 ENCOUNTER — Encounter (HOSPITAL_COMMUNITY): Payer: Self-pay | Admitting: *Deleted

## 2014-05-11 ENCOUNTER — Emergency Department (HOSPITAL_COMMUNITY)
Admission: EM | Admit: 2014-05-11 | Discharge: 2014-05-11 | Disposition: A | Discharge status: Home / Self Care | Payer: Medicare Other | Attending: Emergency Medicine | Admitting: Emergency Medicine

## 2014-05-11 ENCOUNTER — Emergency Department (HOSPITAL_COMMUNITY): Payer: Medicare Other

## 2014-05-11 DIAGNOSIS — Z880 Allergy status to penicillin: Secondary | ICD-10-CM | POA: Insufficient documentation

## 2014-05-11 DIAGNOSIS — I2510 Atherosclerotic heart disease of native coronary artery without angina pectoris: Secondary | ICD-10-CM | POA: Diagnosis not present

## 2014-05-11 DIAGNOSIS — Y998 Other external cause status: Secondary | ICD-10-CM | POA: Diagnosis not present

## 2014-05-11 DIAGNOSIS — I10 Essential (primary) hypertension: Secondary | ICD-10-CM | POA: Insufficient documentation

## 2014-05-11 DIAGNOSIS — W271XXA Contact with garden tool, initial encounter: Secondary | ICD-10-CM | POA: Insufficient documentation

## 2014-05-11 DIAGNOSIS — Z79899 Other long term (current) drug therapy: Secondary | ICD-10-CM | POA: Diagnosis not present

## 2014-05-11 DIAGNOSIS — Y9289 Other specified places as the place of occurrence of the external cause: Secondary | ICD-10-CM | POA: Diagnosis not present

## 2014-05-11 DIAGNOSIS — S8991XA Unspecified injury of right lower leg, initial encounter: Secondary | ICD-10-CM | POA: Diagnosis present

## 2014-05-11 DIAGNOSIS — E668 Other obesity: Secondary | ICD-10-CM | POA: Diagnosis not present

## 2014-05-11 DIAGNOSIS — T1490XA Injury, unspecified, initial encounter: Secondary | ICD-10-CM

## 2014-05-11 DIAGNOSIS — Z87891 Personal history of nicotine dependence: Secondary | ICD-10-CM | POA: Insufficient documentation

## 2014-05-11 DIAGNOSIS — M109 Gout, unspecified: Secondary | ICD-10-CM | POA: Insufficient documentation

## 2014-05-11 DIAGNOSIS — Z7902 Long term (current) use of antithrombotics/antiplatelets: Secondary | ICD-10-CM | POA: Insufficient documentation

## 2014-05-11 DIAGNOSIS — Y9389 Activity, other specified: Secondary | ICD-10-CM | POA: Insufficient documentation

## 2014-05-11 DIAGNOSIS — S8011XA Contusion of right lower leg, initial encounter: Secondary | ICD-10-CM

## 2014-05-11 DIAGNOSIS — E785 Hyperlipidemia, unspecified: Secondary | ICD-10-CM | POA: Diagnosis not present

## 2014-05-11 MED ORDER — BACITRACIN ZINC 500 UNIT/GM EX OINT
TOPICAL_OINTMENT | CUTANEOUS | Status: AC
  Administered 2014-05-11: 14:00:00
  Filled 2014-05-11: qty 0.9

## 2014-05-11 NOTE — ED Provider Notes (Signed)
CSN: [id number]     Arrival date & time 05/11/14  0941 History   First MD Initiated Contact with Patient 05/11/14 1000     Chief Complaint  Patient presents with  . Leg Injury     (Consider location/radiation/quality/duration/timing/severity/associated sxs/prior Treatment) HPI   Maurice Munoz is a 68 y.o. male who presents to the Emergency Department complaining of pain, bruising and swelling to the right leg and knee that began six days ago after a riding mower turned over onto his right leg.  He states that his leg was "pinned" underneath the mower and he had to push to the mower off his leg and required assistance getting up.  He has been weight bearing since the accident, but continues to have swelling and pain with movement of the leg.  He denies other injuries, abdominal pain, difficulty urinating, testicular pain or swelling.  He reports taking ASA and Plavix every other day due to h/o cardiac stents and CAD.     Past Medical History  Diagnosis Date  . CAD (coronary artery disease) 1999  . DDD (degenerative disc disease)   . HTN (hypertension)   . Obesity   . Hyperlipidemia   . Gout   . Peripheral edema    Past Surgical History  Procedure Laterality Date  . Back surgery      r/t DDD  . Cardiac catheterization      RCA, PDA  . Cardiac catheterization  april 2007    stenting LAD, distal RCA   No family history on file. History  Substance Use Topics  . Smoking status: Former Smoker    Types: Cigarettes    Quit date: 11/27/1988  . Smokeless tobacco: Not on file  . Alcohol Use: No    Review of Systems  Constitutional: Negative for fever and chills.  Respiratory: Negative for chest tightness and shortness of breath.   Cardiovascular: Negative for chest pain.  Gastrointestinal: Negative for nausea, vomiting, abdominal pain and blood in stool.  Genitourinary: Negative for dysuria, hematuria, flank pain, decreased urine volume, penile swelling, scrotal swelling,  difficulty urinating and testicular pain.  Musculoskeletal: Positive for joint swelling and arthralgias.       Right leg pain, swelling and bruising  Skin: Negative for color change and wound.  Neurological: Negative for dizziness, weakness and numbness.  All other systems reviewed and are negative.     Allergies  Ibuprofen; Neosporin; and Penicillins  Home Medications   Prior to Admission medications   Medication Sig Start Date End Date Taking? Authorizing Provider  allopurinol (ZYLOPRIM) 300 MG tablet daily. 11/06/12   Historical Provider, MD  clopidogrel (PLAVIX) 75 MG tablet TAKE ONE (1) TABLET EACH DAY 06/19/13   Troy Sine, MD  isosorbide mononitrate (IMDUR) 30 MG 24 hr tablet Take 1 tablet (30 mg total) by mouth daily. 12/10/13   Troy Sine, MD  lisinopril (PRINIVIL,ZESTRIL) 20 MG tablet Take 0.5 tablets (10 mg total) by mouth daily. 04/16/13   Troy Sine, MD  metoprolol (LOPRESSOR) 50 MG tablet Take 1 tablet (50 mg total) by mouth 2 (two) times daily. 08/22/13   Troy Sine, MD  oxyCODONE-acetaminophen (PERCOCET) 10-325 MG per tablet as needed.  10/11/12   Historical Provider, MD  simvastatin (ZOCOR) 20 MG tablet TAKE ONE TABLET BY MOUTH EVERY NIGHT AT BEDTIME    Troy Sine, MD  tiZANidine (ZANAFLEX) 4 MG tablet 3 (three) times daily as needed. 11/08/12   Historical Provider, MD  torsemide [facility])  20 MG tablet Take 1 tablet (20 mg total) by mouth once. 09/20/13   Troy Sine, MD   BP 139/79 mmHg  Pulse 72  Temp(Src) 98 F (36.7 C) (Oral)  Resp 18  Ht 6\' 1"  (1.854 m)  Wt 273 lb (123.832 kg)  BMI 36.03 kg/m2  SpO2 98% Physical Exam  Constitutional: He is oriented to person, place, and time. He appears well-developed and well-nourished. No distress.  HENT:  Head: Normocephalic and atraumatic.  Cardiovascular: Normal rate, regular rhythm, normal heart sounds and intact distal pulses.   No murmur heard. Pulmonary/Chest: Effort normal and breath sounds  normal. No respiratory distress.  Abdominal: Soft. He exhibits no distension and no mass. There is no tenderness. There is no rebound and no guarding.  Musculoskeletal: He exhibits edema and tenderness.  Diffuse ecchymosis, STS of the medial right upper and lower leg.  ttp of the medial right knee.  Has ROM of the knee, with pain on flexion.   No bony deformity. Abrasion present to medial knee.  Compartments of the right LE are soft.  Dp pulse brisk, distal sensation intact  Neurological: He is alert and oriented to person, place, and time. He exhibits normal muscle tone. Coordination normal.  Skin: Skin is warm and dry.  Nursing note and vitals reviewed.   ED Course  Procedures (including critical care time) Labs Review Labs Reviewed - No data to display  Imaging Review Dg Femur Right  05/11/2014   CLINICAL DATA:  Right leg bruised and swollen, lawn mower turned over on his leg last Sunday Nov 15, bruising medially from between his legs down to below knee, oozing sore at medially knee jt area  EXAM: RIGHT FEMUR - 2 VIEW  COMPARISON:  None.  FINDINGS: Exam demonstrates mild degenerative change over the right hip. There are mild degenerative changes over the knee with prominent enthesopathy over the superior and inferior patella as well as at the tibial tubercle. No acute fracture or dislocation.  IMPRESSION: No acute fracture.   Electronically Signed   By: Marin Olp M.D.   On: 05/11/2014 11:20   Dg Tibia/fibula Right  05/11/2014   CLINICAL DATA:  Right leg bruising. Lawn mower turned over run leg 6 days ago.  EXAM: RIGHT TIBIA AND FIBULA - 2 VIEW  COMPARISON:  None.  FINDINGS: No acute bony abnormality. Specifically, no fracture, subluxation, or dislocation. Soft tissues are intact.  IMPRESSION: No acute bony abnormality.   Electronically Signed   By: Rolm Baptise M.D.   On: 05/11/2014 11:38   US Venous Img Lower Unilateral Right  05/11/2014   CLINICAL DATA:  68 year old male with right  lower extremity pain and swelling.  EXAM: RIGHT LOWER EXTREMITY VENOUS DOPPLER ULTRASOUND  TECHNIQUE: Gray-scale sonography with graded compression, as well as color Doppler and duplex ultrasound were performed to evaluate the lower extremity deep venous systems from the level of the common femoral vein and including the common femoral, femoral, profunda femoral, popliteal and calf veins including the posterior tibial, peroneal and gastrocnemius veins when visible. The superficial great saphenous vein was also interrogated. Spectral Doppler was utilized to evaluate flow at rest and with distal augmentation maneuvers in the common femoral, femoral and popliteal veins.  COMPARISON:  None.  FINDINGS: Deep venous system appears patent and compressible from groin through popliteal fossae bilaterally.  Spontaneous venous flow present with evidence of respiratory phasicity. Augmentation intact.  No intraluminal thrombus identified.  Visualized portions of the greater saphenous veins patent  bilaterally.  The calf veins are difficult to evaluate due to patient body habitus.  IMPRESSION: No evidence of deep venous thrombosis.   Electronically Signed   By: Hassan Rowan M.D.   On: 05/11/2014 13:41   Dg Knee Complete 4 Views Right  05/11/2014   CLINICAL DATA:  Right leg bruised and swollen. Lawn mower turned over on leg 6 days ago.  EXAM: RIGHT KNEE - COMPLETE 4+ VIEW  COMPARISON:  None.  FINDINGS: There are not degenerative changes in the right knee, most pronounced in the patellofemoral compartment with joint space narrowing and spurring. Enthesopathic changes noted off the inferior patella and proximal tibia. No fracture, subluxation or dislocation. No joint effusion.  IMPRESSION: Mild degenerative changes.  No acute bony abnormality.   Electronically Signed   By: Rolm Baptise M.D.   On: 05/11/2014 11:37     EKG Interpretation None      MDM   Final diagnoses:  Trauma  Hematoma of leg, right, initial encounter     Patient is well appearing.  Diffuse ecchymosis to the medial right thigh, knee and lower leg.  Moderate STS of the medial knee w/o effusion.  NV intact.  Neg for fx or DVT.  Compartments of the right LE are soft.    discussed care plan with patient and with Dr. Dina Rich.  ACE wrap applied to the leg.  Pt agrees to ice, elevation and close f/u with his PMD or orthopedic physician.  Patient reports that he has narcotic pain medication at home.    Rhodia Acres L. Vanessa [place], PA-C 05/13/14 [place], MD 05/14/14 [phone]

## 2014-05-11 NOTE — ED Notes (Signed)
Dressing applied to wound. Ace wraps placed. Pt reports relief with wrap.

## 2014-05-11 NOTE — Discharge Instructions (Signed)
Hematoma °A hematoma is a collection of blood. The collection of blood can turn into a hard, painful lump under the skin. Your skin may turn blue or yellow if the hematoma is close to the surface of the skin. Most hematomas get better in a few days to weeks. Some hematomas are serious and need medical care. Hematomas can be very small or very big. °HOME CARE °· Apply ice to the injured area: °¨ Put ice in a plastic bag. °¨ Place a towel between your skin and the bag. °¨ Leave the ice on for 20 minutes, 2-3 times a day for the first 1 to 2 days. °· After the first 2 days, switch to using warm packs on the injured area. °· Raise (elevate) the injured area to lessen pain and puffiness (swelling). You may also wrap the area with an elastic bandage. Make sure the bandage is not wrapped too tight. °· If you have a painful hematoma on your leg or foot, you may use crutches for a couple days. °· Only take medicines as told by your doctor. °GET HELP RIGHT AWAY IF:  °· Your pain gets worse. °· Your pain is not controlled with medicine. °· You have a fever. °· Your puffiness gets worse. °· Your skin turns more blue or yellow. °· Your skin over the hematoma breaks or starts bleeding. °· Your hematoma is in your chest or belly (abdomen) and you are short of breath, feel weak, or have a change in consciousness. °· Your hematoma is on your scalp and you have a headache that gets worse or a change in alertness or consciousness. °MAKE SURE YOU:  °· Understand these instructions. °· Will watch your condition. °· Will get help right away if you are not doing well or get worse. °Document Released: 07/15/2004 Document Revised: 02/07/2013 Document Reviewed: 11/15/2012 °Exit[employer]® Patient Information ©2015 [employer]. This information is not intended to replace advice given to you by your health care provider. Make sure you discuss any questions you have with your health care provider. ° °

## 2014-05-11 NOTE — ED Notes (Signed)
Ultrasound at bedside

## 2014-05-11 NOTE — ED Notes (Signed)
Pt in Radiology. Family in room deny needs at present.

## 2014-05-11 NOTE — ED Notes (Signed)
Pt was mowing last week and his law mower turned over on his right leg. Pt states the pain has not gone away.

## 2014-05-13 ENCOUNTER — Telehealth: Payer: Self-pay | Admitting: Orthopedic Surgery

## 2014-05-13 NOTE — Telephone Encounter (Addendum)
Call received from patient following Emergency Room visit at [facility] on 05/11/14 for problem of right leg injury, date of injury 05/05/14-Xrays and CT had been done on this date; states leg bruise/wound is red and hurting; please advise if appointment is to be today, based on current schedule. Patient's ph#'s are [phone] ([phone] (Mobile)  *patient called back to check on status just before 12:00noon - states 'he had spoken with Dr Aline Brochure at church' - also states he must leave for out of town very early tomorrow morning.  Updated the message with this information.

## 2014-05-22 ENCOUNTER — Telehealth: Payer: Self-pay | Admitting: Cardiovascular Disease

## 2014-05-22 NOTE — Telephone Encounter (Signed)
Please call,pt had an accident on his lawnmower.He went to the doctor and took him off of his Plavix and was told to contact Dr Claiborne Billings.

## 2014-05-22 NOTE — Telephone Encounter (Signed)
Returned call to patient's wife she stated she wanted Dr.Kelly to know Dr.Bradford stopped husband's plavix.Stated he fell off lawn mower 2 weeks ago.Stated he has a hematoma on rt leg.Stated rt groin down to rt lower leg bruised.Stated Dr.Bradford told him to stop plavix.Message sent to [facility].

## 2014-05-23 NOTE — Telephone Encounter (Signed)
Pt is still waiting to hear back from Dr.kelly about his plavix. Please call  thanks

## 2014-05-23 NOTE — Telephone Encounter (Signed)
Spoke with pt wife, discussed with dr Claiborne Billings, patient is to remain off the plavix and cont an aspirin.

## 2014-05-24 ENCOUNTER — Other Ambulatory Visit (HOSPITAL_COMMUNITY): Payer: Self-pay | Admitting: Cardiovascular Disease

## 2014-05-24 NOTE — Telephone Encounter (Signed)
Rx has been sent to the pharmacy electronically. ° °

## 2014-07-09 ENCOUNTER — Other Ambulatory Visit (HOSPITAL_COMMUNITY): Payer: Self-pay | Admitting: Cardiovascular Disease

## 2014-07-09 DIAGNOSIS — I6529 Occlusion and stenosis of unspecified carotid artery: Secondary | ICD-10-CM

## 2014-07-17 ENCOUNTER — Ambulatory Visit (HOSPITAL_COMMUNITY)
Admission: RE | Admit: 2014-07-17 | Discharge: 2014-07-17 | Disposition: A | Discharge status: Home / Self Care | Payer: Medicare Other | Source: Ambulatory Visit | Attending: Cardiovascular Disease | Admitting: Cardiovascular Disease

## 2014-07-17 DIAGNOSIS — I6529 Occlusion and stenosis of unspecified carotid artery: Secondary | ICD-10-CM | POA: Diagnosis present

## 2014-07-17 DIAGNOSIS — I6523 Occlusion and stenosis of bilateral carotid arteries: Secondary | ICD-10-CM | POA: Diagnosis not present

## 2014-07-17 NOTE — Progress Notes (Signed)
Carotid Duplex Completed. °Brianna L Mazza,RVT °

## 2014-08-08 ENCOUNTER — Inpatient Hospital Stay (HOSPITAL_COMMUNITY)
Admission: EM | Admit: 2014-08-08 | Discharge: 2014-08-14 | DRG: 440 | Disposition: A | Discharge status: Home / Self Care | Payer: Medicare Other | Attending: Internal Medicine | Admitting: Internal Medicine

## 2014-08-08 ENCOUNTER — Emergency Department (HOSPITAL_COMMUNITY): Payer: Medicare Other

## 2014-08-08 ENCOUNTER — Encounter (HOSPITAL_COMMUNITY): Payer: Self-pay | Admitting: Emergency Medicine

## 2014-08-08 DIAGNOSIS — M5090 Cervical disc disorder, unspecified, unspecified cervical region: Secondary | ICD-10-CM | POA: Diagnosis not present

## 2014-08-08 DIAGNOSIS — Z8719 Personal history of other diseases of the digestive system: Secondary | ICD-10-CM

## 2014-08-08 DIAGNOSIS — Z801 Family history of malignant neoplasm of trachea, bronchus and lung: Secondary | ICD-10-CM

## 2014-08-08 DIAGNOSIS — E669 Obesity, unspecified: Secondary | ICD-10-CM | POA: Diagnosis present

## 2014-08-08 DIAGNOSIS — K850 Idiopathic acute pancreatitis: Secondary | ICD-10-CM | POA: Diagnosis not present

## 2014-08-08 DIAGNOSIS — R110 Nausea: Secondary | ICD-10-CM | POA: Diagnosis not present

## 2014-08-08 DIAGNOSIS — Z6835 Body mass index (BMI) 35.0-35.9, adult: Secondary | ICD-10-CM | POA: Diagnosis not present

## 2014-08-08 DIAGNOSIS — M10011 Idiopathic gout, right shoulder: Secondary | ICD-10-CM | POA: Diagnosis not present

## 2014-08-08 DIAGNOSIS — G8929 Other chronic pain: Secondary | ICD-10-CM | POA: Diagnosis present

## 2014-08-08 DIAGNOSIS — Z87891 Personal history of nicotine dependence: Secondary | ICD-10-CM | POA: Diagnosis not present

## 2014-08-08 DIAGNOSIS — K859 Acute pancreatitis without necrosis or infection, unspecified: Secondary | ICD-10-CM | POA: Diagnosis present

## 2014-08-08 DIAGNOSIS — I10 Essential (primary) hypertension: Secondary | ICD-10-CM | POA: Diagnosis present

## 2014-08-08 DIAGNOSIS — Z7952 Long term (current) use of systemic steroids: Secondary | ICD-10-CM | POA: Diagnosis not present

## 2014-08-08 DIAGNOSIS — I2510 Atherosclerotic heart disease of native coronary artery without angina pectoris: Secondary | ICD-10-CM | POA: Diagnosis present

## 2014-08-08 DIAGNOSIS — Z7982 Long term (current) use of aspirin: Secondary | ICD-10-CM

## 2014-08-08 DIAGNOSIS — E785 Hyperlipidemia, unspecified: Secondary | ICD-10-CM | POA: Diagnosis present

## 2014-08-08 DIAGNOSIS — M109 Gout, unspecified: Secondary | ICD-10-CM

## 2014-08-08 DIAGNOSIS — R1010 Upper abdominal pain, unspecified: Secondary | ICD-10-CM | POA: Diagnosis not present

## 2014-08-08 DIAGNOSIS — Z8249 Family history of ischemic heart disease and other diseases of the circulatory system: Secondary | ICD-10-CM | POA: Diagnosis not present

## 2014-08-08 HISTORY — DX: Personal history of other diseases of the digestive system: Z87.19

## 2014-08-08 LAB — COMPREHENSIVE METABOLIC PANEL
ALBUMIN: 4.1 g/dL (ref 3.5–5.2)
ALK PHOS: 74 U/L (ref 39–117)
ALT: 17 U/L (ref 0–53)
ANION GAP: 6 (ref 5–15)
AST: 21 U/L (ref 0–37)
BUN: 20 mg/dL (ref 6–23)
CO2: 26 mmol/L (ref 19–32)
CREATININE: 1.24 mg/dL (ref 0.50–1.35)
Calcium: 9 mg/dL (ref 8.4–10.5)
Chloride: 106 mmol/L (ref 96–112)
GFR calc Af Amer: 68 mL/min — ABNORMAL LOW (ref >90)
GFR calc non Af Amer: 59 mL/min — ABNORMAL LOW (ref >90)
GLUCOSE: 135 mg/dL — AB (ref 70–99)
POTASSIUM: 4.1 mmol/L (ref 3.5–5.1)
Sodium: 138 mmol/L (ref 135–145)
TOTAL PROTEIN: 7.4 g/dL (ref 6.0–8.3)
Total Bilirubin: 1.3 mg/dL — ABNORMAL HIGH (ref 0.3–1.2)

## 2014-08-08 LAB — CBC WITH DIFFERENTIAL/PLATELET
BASOS ABS: 0 10*3/uL (ref 0.0–0.1)
Basophils Relative: 0 % (ref 0–1)
Eosinophils Absolute: 0.1 10*3/uL (ref 0.0–0.7)
Eosinophils Relative: 1 % (ref 0–5)
HCT: 43.7 % (ref 39.0–52.0)
Hemoglobin: 14.6 g/dL (ref 13.0–17.0)
LYMPHS ABS: 1.5 10*3/uL (ref 0.7–4.0)
LYMPHS PCT: 14 % (ref 12–46)
MCH: 29.3 pg (ref 26.0–34.0)
MCHC: 33.4 g/dL (ref 30.0–36.0)
MCV: 87.8 fL (ref 78.0–100.0)
Monocytes Absolute: 0.8 10*3/uL (ref 0.1–1.0)
Monocytes Relative: 7 % (ref 3–12)
NEUTROS ABS: 8.1 10*3/uL — AB (ref 1.7–7.7)
Neutrophils Relative %: 78 % — ABNORMAL HIGH (ref 43–77)
Platelets: 217 10*3/uL (ref 150–400)
RBC: 4.98 MIL/uL (ref 4.22–5.81)
RDW: 14.1 % (ref 11.5–15.5)
WBC: 10.5 10*3/uL (ref 4.0–10.5)

## 2014-08-08 LAB — LIPASE, BLOOD: LIPASE: 1165 U/L — AB (ref 11–59)

## 2014-08-08 LAB — TROPONIN I

## 2014-08-08 MED ORDER — METOPROLOL TARTRATE 50 MG PO TABS
50.0000 mg | ORAL_TABLET | Freq: Two times a day (BID) | ORAL | Status: DC
  Administered 2014-08-08 – 2014-08-14 (×12): 50 mg via ORAL
  Filled 2014-08-08 (×12): qty 1

## 2014-08-08 MED ORDER — SODIUM CHLORIDE 0.9 % IV BOLUS (SEPSIS)
1000.0000 mL | Freq: Once | INTRAVENOUS | Status: AC
  Administered 2014-08-08: 1000 mL via INTRAVENOUS

## 2014-08-08 MED ORDER — HYDRALAZINE HCL 20 MG/ML IJ SOLN
5.0000 mg | Freq: Four times a day (QID) | INTRAMUSCULAR | Status: DC | PRN
  Filled 2014-08-08: qty 1

## 2014-08-08 MED ORDER — ISOSORBIDE MONONITRATE ER 60 MG PO TB24
30.0000 mg | ORAL_TABLET | Freq: Every day | ORAL | Status: DC
  Administered 2014-08-08 – 2014-08-14 (×7): 30 mg via ORAL
  Filled 2014-08-08 (×7): qty 1

## 2014-08-08 MED ORDER — TORSEMIDE 20 MG PO TABS
20.0000 mg | ORAL_TABLET | Freq: Once | ORAL | Status: AC
  Administered 2014-08-08: 20 mg via ORAL
  Filled 2014-08-08: qty 1

## 2014-08-08 MED ORDER — FENTANYL CITRATE 0.05 MG/ML IJ SOLN
50.0000 ug | Freq: Once | INTRAMUSCULAR | Status: AC
  Administered 2014-08-08: 50 ug via INTRAVENOUS
  Filled 2014-08-08: qty 2

## 2014-08-08 MED ORDER — IOHEXOL 300 MG/ML  SOLN
100.0000 mL | Freq: Once | INTRAMUSCULAR | Status: AC | PRN
  Administered 2014-08-08: 100 mL via INTRAVENOUS

## 2014-08-08 MED ORDER — PREDNISONE 20 MG PO TABS
20.0000 mg | ORAL_TABLET | Freq: Two times a day (BID) | ORAL | Status: DC | PRN
  Administered 2014-08-11: 20 mg via ORAL
  Filled 2014-08-08: qty 1

## 2014-08-08 MED ORDER — CLOPIDOGREL BISULFATE 75 MG PO TABS
75.0000 mg | ORAL_TABLET | Freq: Every day | ORAL | Status: DC
  Administered 2014-08-08 – 2014-08-14 (×5): 75 mg via ORAL
  Filled 2014-08-08 (×7): qty 1

## 2014-08-08 MED ORDER — MORPHINE SULFATE 4 MG/ML IJ SOLN
4.0000 mg | Freq: Once | INTRAMUSCULAR | Status: AC
Start: 2014-08-08 — End: 2014-08-08
  Administered 2014-08-08: 4 mg via INTRAVENOUS
  Filled 2014-08-08: qty 1

## 2014-08-08 MED ORDER — OXYCODONE HCL 5 MG PO TABS
5.0000 mg | ORAL_TABLET | ORAL | Status: DC | PRN
  Administered 2014-08-08 – 2014-08-09 (×3): 5 mg via ORAL
  Filled 2014-08-08 (×3): qty 1

## 2014-08-08 MED ORDER — ASPIRIN EC 81 MG PO TBEC
81.0000 mg | DELAYED_RELEASE_TABLET | Freq: Every day | ORAL | Status: DC
  Administered 2014-08-08 – 2014-08-14 (×7): 81 mg via ORAL
  Filled 2014-08-08 (×7): qty 1

## 2014-08-08 MED ORDER — ALLOPURINOL 300 MG PO TABS
300.0000 mg | ORAL_TABLET | Freq: Every day | ORAL | Status: DC
  Administered 2014-08-08 – 2014-08-12 (×5): 300 mg via ORAL
  Filled 2014-08-08 (×5): qty 1

## 2014-08-08 MED ORDER — MORPHINE SULFATE 2 MG/ML IJ SOLN
2.0000 mg | Freq: Once | INTRAMUSCULAR | Status: AC
  Administered 2014-08-08: 2 mg via INTRAVENOUS
  Filled 2014-08-08: qty 1

## 2014-08-08 MED ORDER — OXYCODONE-ACETAMINOPHEN 10-325 MG PO TABS: 1.0000 | ORAL_TABLET | ORAL | Status: DC | PRN

## 2014-08-08 MED ORDER — OXYCODONE-ACETAMINOPHEN 5-325 MG PO TABS
1.0000 | ORAL_TABLET | ORAL | Status: DC | PRN
  Administered 2014-08-08: 1 via ORAL
  Filled 2014-08-08: qty 1

## 2014-08-08 MED ORDER — IOHEXOL 300 MG/ML  SOLN
50.0000 mL | Freq: Once | INTRAMUSCULAR | Status: AC | PRN
  Administered 2014-08-08: 50 mL via ORAL

## 2014-08-08 MED ORDER — SODIUM CHLORIDE 0.9 % IV SOLN
Freq: Once | INTRAVENOUS | Status: AC
  Administered 2014-08-08: 13:00:00 via INTRAVENOUS

## 2014-08-08 MED ORDER — SODIUM CHLORIDE 0.9 % IJ SOLN
INTRAMUSCULAR | Status: AC
  Filled 2014-08-08: qty 600

## 2014-08-08 MED ORDER — ONDANSETRON HCL 4 MG/2ML IJ SOLN
4.0000 mg | Freq: Once | INTRAMUSCULAR | Status: AC
  Administered 2014-08-08: 4 mg via INTRAVENOUS
  Filled 2014-08-08: qty 2

## 2014-08-08 MED ORDER — TIZANIDINE HCL 4 MG PO TABS
4.0000 mg | ORAL_TABLET | Freq: Every day | ORAL | Status: DC
  Administered 2014-08-08 – 2014-08-13 (×6): 4 mg via ORAL
  Filled 2014-08-08 (×6): qty 1

## 2014-08-08 NOTE — H&P (Signed)
[facility] History and Physical  Maurice Munoz AST:[id number] DOB: January 15, 1946 DOA: 08/08/2014  Referring physician: ED PCP: Leonides Grills, MD  Specialists: none  Chief Complaint: Abd pain  HPI:    69 y/o ? Known degenerative disc disease, both cervical and lumbar, CAD status post stenting 2007 prior history intervention 1996, last Myoview 3/15 low risk, gout, obesity-has lost over 100 pounds recently admitted to [facility] ED 2/18 with abdominal pain Patient started around 11 PM 2/17 central abdomen nonradiating. Dissimilar from his prior heart pains. Felt nauseous, took Carafate which did not really help. Poor by mouth intake. No diaphoresis, no rash, no fever, no sputum, no diarrhea, no dysuria, Pain was consistent and constant without intermittent features-range in intensity from 8 to 10 on 10 Progressed in intensity until 0300 08/08/14 when patient presented to the emergency room for evaluation. Has not touched alcohol for 36 years Nonsmoker 20 years No new medications No sick contacts with viral illnesses   In the emergency room the patient was found to have on ultrasound abdomen as well as CT abdomen evidence of acute pancreatitis-lipase was 1156. Alkaline phosphatase was 74 AST ALT 21/17 Total bilirubin 1.3 Troponins negative  Review of Systems: A 14 point ROS was performed and is negative except as noted in the HPI    Past Medical History  Diagnosis Date  . CAD (coronary artery disease) 1999  . DDD (degenerative disc disease)   . HTN (hypertension)   . Obesity   . Hyperlipidemia   . Gout   . Peripheral edema    Past Surgical History  Procedure Laterality Date  . Back surgery      r/t DDD  . Cardiac catheterization      RCA, PDA  . Cardiac catheterization  april 2007    stenting LAD, distal RCA   Social History:  History   Social History Narrative   Previously used used to work on a farm   Currently retired secondary to chronic  back pains-   Used to be a Art therapist   Married to his wife and lives in [place]   Father died at age 25 lung cancer   Mother had CAD   Wife had cancer    Allergies  Allergen Reactions  . Ibuprofen Itching  . Neosporin [Neomycin-Bacitracin Zn-Polymyx] Swelling  . Penicillins Other (See Comments)    Patient doesn't remember reaction    History reviewed. No pertinent family history.   Prior to Admission medications   Medication Sig Start Date End Date Taking? Authorizing Provider  acetaminophen (TYLENOL) 500 MG tablet Take 1,000 mg by mouth 2 (two) times daily as needed for moderate pain.    Historical Provider, MD  allopurinol (ZYLOPRIM) 300 MG tablet Take 300 mg by mouth daily.  11/06/12   Historical Provider, MD  aspirin EC 81 MG tablet Take 81 mg by mouth daily.    Historical Provider, MD  clopidogrel (PLAVIX) 75 MG tablet TAKE ONE (1) TABLET EACH DAY 06/19/13   Troy Sine, MD  isosorbide mononitrate (IMDUR) 30 MG 24 hr tablet Take 1 tablet (30 mg total) by mouth daily. 12/10/13   Troy Sine, MD  lisinopril (PRINIVIL,ZESTRIL) 20 MG tablet TAKE ONE-HALF TABLET DAILY 05/24/14   Troy Sine, MD  metoprolol (LOPRESSOR) 50 MG tablet Take 1 tablet (50 mg total) by mouth 2 (two) times daily. 08/22/13   Troy Sine, MD  oxyCODONE-acetaminophen (PERCOCET) 10-325 MG per tablet Take 1  tablet by mouth every 8 (eight) hours as needed for pain.  10/11/12   Historical Provider, MD  predniSONE (DELTASONE) 10 MG tablet Take 20 mg by mouth 2 (two) times daily as needed (GOUT FLARES).    Historical Provider, MD  simvastatin (ZOCOR) 20 MG tablet TAKE ONE TABLET BY MOUTH EVERY NIGHT AT BEDTIME    Troy Sine, MD  tiZANidine (ZANAFLEX) 4 MG tablet Take 4 mg by mouth at bedtime.  11/08/12   Historical Provider, MD  torsemide (DEMADEX) 20 MG tablet Take 1 tablet (20 mg total) by mouth once. Patient taking differently: Take 20 mg by mouth daily as needed  (swelling).  09/20/13   Troy Sine, MD   Physical Exam: Filed Vitals:   08/08/14 9833 08/08/14 8250 08/08/14 0832  BP: 174/67 146/57 169/81  Pulse: 78 71 76  Temp: 98 F (36.7 C)  98.1 F (36.7 C)  TempSrc: Oral  Oral  Resp: 19 20 20   Height: 6' 1"  (1.854 m)    Weight: 121.564 kg (268 lb)    SpO2: 98% 97% 99%   EOMI NCAT no pallor, no icterus, Mallampati 4, no JVD, no bruit No lymphadenopathy no thyromegaly S1-S2 regular rate rhythm Chest clinically clear no added sound Epigastric tenderness +, + Cannot appreciate organomegaly secondary to habitus Grade 1 lower extremity edema No rash    Labs on Admission:  Basic Metabolic Panel:  Recent Labs Lab 08/08/14 0554  NA 138  K 4.1  CL 106  CO2 26  GLUCOSE 135*  BUN 20  CREATININE 1.24  CALCIUM 9.0   Liver Function Tests:  Recent Labs Lab 08/08/14 0554  AST 21  ALT 17  ALKPHOS 74  BILITOT 1.3*  PROT 7.4  ALBUMIN 4.1    Recent Labs Lab 08/08/14 0554  LIPASE 1165*   No results for input(s): AMMONIA in the last 168 hours. CBC:  Recent Labs Lab 08/08/14 0554  WBC 10.5  NEUTROABS 8.1*  HGB 14.6  HCT 43.7  MCV 87.8  PLT 217   Cardiac Enzymes:  Recent Labs Lab 08/08/14 0554 08/08/14 0834  TROPONINI <0.03 <0.03    BNP (last 3 results) No results for input(s): BNP in the last 8760 hours.  ProBNP (last 3 results) No results for input(s): PROBNP in the last 8760 hours.  CBG: No results for input(s): GLUCAP in the last 168 hours.  Radiological Exams on Admission: Ct Abdomen Pelvis W Contrast  08/08/2014   CLINICAL DATA:  Upper abdominal pain radiating to the chest. No indication of trauma.  EXAM: CT ABDOMEN AND PELVIS WITH CONTRAST  TECHNIQUE: Multidetector CT imaging of the abdomen and pelvis was performed using the standard protocol following bolus administration of intravenous contrast.  CONTRAST:  37m OMNIPAQUE IOHEXOL 300 MG/ML SOLN, 1048mOMNIPAQUE IOHEXOL 300 MG/ML SOLN  COMPARISON:   03/19/2010  FINDINGS: BODY WALL: Unremarkable.  LOWER CHEST: Mild progression of reticular opacities in the lower lungs. No fibrotic change.  Coronary atherosclerosis visualized in the right circulation.  ABDOMEN/PELVIS:  Liver: No focal abnormality.  Biliary: No evidence of biliary obstruction or stone.  Pancreas: Edema in the retroperitoneum of the left upper quadrant and left flank is favored secondary to pancreatitis. There is no evidence of pancreatic necrosis or organized fluid collection.  Spleen: No abnormality to explain the neighboring edema.  Adrenals: Stable 14 nodule in the left adrenal gland, most consistent with adenoma.  Kidneys and ureters: No hydronephrosis or stone.  Bladder: Unremarkable.  Reproductive: Unremarkable.  Bowel:  No obstruction. Normal appendix.  Retroperitoneum: No mass or adenopathy.  Peritoneum: No ascites or pneumoperitoneum.  Vascular: No acute abnormality. Diffuse atherosclerotic calcification.  OSSEOUS: Diffuse ankylosis of the spine, sparing L1-L2, L4-5, L5-S1 discs. 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 The sacroiliac joints are non eroded.  IMPRESSION: 1. Retroperitoneal edema most consistent with acute pancreatitis. No necrosis or fluid collection. 2. Left adrenal adenoma. 3. Spondyloarthropathy with diffuse spinal ankylosis.   Electronically Signed   By: Monte Fantasia M.D.   On: 08/08/2014 10:15   US Abdomen Limited  08/08/2014   CLINICAL DATA:  Nausea and right upper quadrant pain  EXAM: US ABDOMEN LIMITED - RIGHT UPPER QUADRANT  COMPARISON:  None.  FINDINGS: Gallbladder:  Gallbladder is contracted which accentuates the thickness of the gallbladder. No definitive cholelithiasis is noted. A negative sonographic Percell Miller sign is elicited.  Common bile duct:  Diameter: 3.8 mm.  Liver:  No focal lesion identified. Within normal limits in parenchymal echogenicity.  IMPRESSION: Contracted gallbladder without definitive  cholelithiasis. No other focal abnormality is seen.   Electronically Signed   By: Inez Catalina M.D.   On: 08/08/2014 08:04    EKG: Independently reviewed. EKG sinus rhythm PR interval 0 0.12 QRS axis 70, slightly peaked T waves V2, no ST-T wave changes, no significant change since prior tracing 2011  Assessment/Plan Principal Problem:   Pancreatitis-Idiopathy acute 2.18.16 admitted [facility]-I've mentioned to the patient that 60% of cases are related to gallstone disease. Patient's ultrasound abdomen shows no stones and common bile duct of 3. 8 mm so this argues against potentially past stone-I wonder if this could represent a CBD stone?Marland Kitchen We will follow the patient with serial lipase C met in the morning and INR supportive management IV fluids, clear liquids as current evidence indicates pancreatitis patients can be given fluids early. Some of the patient's medications including lisinopril, allopurinol, torsemide are known to cause liver dysfunction but not specifically indicated in pancreatitis causation. We will hold off on patient's lisinopril for right now  Active Problems:   CAD s/p stenting 2007, prior LAD h/o 199, last myoview 3/15 Low risk-continue metoprolol 50 twice a day, continue Imdur 30, continue Plavix 75 od-lisinopril on hold as above-   Hyperlipidemia hold Zocor 20 mg daily at bedtime   Cervical disc disease s/p diskecktomy 2003 , lumbar disc disease status post laminectomy in the 80s-continue oxycodone 10/325 at a higher dose of q4 prn as will be holding Tylenol, continue Zanaflex 4 mg daily at bedtime--I'm not sure if patient is a candidate for further surgery and he will need neurosurgery follow-up    Gout-continue allopurinol for now.  Time spent: 67 Full code Inpatient, [place], [place] [facility] Pager [phone]  If 7PM-7AM, please contact night-coverage [email or web address] Password [facility] 08/08/2014, 10:56 AM

## 2014-08-08 NOTE — ED Notes (Signed)
Report given to J. Keith RN 

## 2014-08-08 NOTE — ED Provider Notes (Signed)
CSN: [id number]     Arrival date & time 08/08/14  0504 History   First MD Initiated Contact with Patient 08/08/14 [phone]     Chief Complaint  Patient presents with  . Abdominal Pain     (Consider location/radiation/quality/duration/timing/severity/associated sxs/prior Treatment) HPI  Patient states on February 14 he ate french fries and a fish sandwich around noontime. About 45 minutes after eating he started getting nausea without vomiting or diarrhea. He denies pain to me. He continued to have nausea the following 2 days. Yesterday he started getting some abdominal discomfort in his upper abdomen and his lower chest. He describes as a tightness. He states it worsens with movement and deep breathing. He states it feels like a knife sticking him. He had nausea last night without diarrhea. He did have one loose stool. He did not have fever at home, wife states she checked his temperature at 1 AM it was 99.5. He denies shortness of breath or coughing. He states he's never had this before. Patient states he has chronic back pain and he does not have pain radiating into his back that is new.  Patient states he has a cardiac stent however it was found with routine evaluation after his brother had heart disease. He states he never had chest pain. He states his brother has also had his gallbladder removed.  PCP Dr Barnetta [facility] Dr Claiborne Billings  Past Medical History  Diagnosis Date  . CAD (coronary artery disease) 1999  . DDD (degenerative disc disease)   . HTN (hypertension)   . Obesity   . Hyperlipidemia   . Gout   . Peripheral edema    Past Surgical History  Procedure Laterality Date  . Back surgery      r/t DDD  . Cardiac catheterization      RCA, PDA  . Cardiac catheterization  april 2007    stenting LAD, distal RCA   History reviewed. No pertinent family history. History  Substance Use Topics  . Smoking status: Former Smoker    Types: Cigarettes    Quit date: 11/27/1988  .  Smokeless tobacco: Not on file  . Alcohol Use: No  retired Lives at home Lives with spouse  Review of Systems  All other systems reviewed and are negative.     Allergies  Ibuprofen; Neosporin; and Penicillins  Home Medications   Prior to Admission medications   Medication Sig Start Date End Date Taking? Authorizing Provider  acetaminophen (TYLENOL) 500 MG tablet Take 1,000 mg by mouth 2 (two) times daily as needed for moderate pain.    Historical Provider, MD  allopurinol (ZYLOPRIM) 300 MG tablet Take 300 mg by mouth daily.  11/06/12   Historical Provider, MD  aspirin EC 81 MG tablet Take 81 mg by mouth daily.    Historical Provider, MD  clopidogrel (PLAVIX) 75 MG tablet TAKE ONE (1) TABLET EACH DAY 06/19/13   Troy Sine, MD  isosorbide mononitrate (IMDUR) 30 MG 24 hr tablet Take 1 tablet (30 mg total) by mouth daily. 12/10/13   Troy Sine, MD  lisinopril (PRINIVIL,ZESTRIL) 20 MG tablet TAKE ONE-HALF TABLET DAILY 05/24/14   Troy Sine, MD  metoprolol (LOPRESSOR) 50 MG tablet Take 1 tablet (50 mg total) by mouth 2 (two) times daily. 08/22/13   Troy Sine, MD  oxyCODONE-acetaminophen (PERCOCET) 10-325 MG per tablet Take 1 tablet by mouth every 8 (eight) hours as needed for pain.  10/11/12   Historical Provider, MD  predniSONE (DELTASONE)  10 MG tablet Take 20 mg by mouth 2 (two) times daily as needed (GOUT FLARES).    Historical Provider, MD  simvastatin (ZOCOR) 20 MG tablet TAKE ONE TABLET BY MOUTH EVERY NIGHT AT BEDTIME    Troy Sine, MD  tiZANidine (ZANAFLEX) 4 MG tablet Take 4 mg by mouth at bedtime.  11/08/12   Historical Provider, MD  torsemide (DEMADEX) 20 MG tablet Take 1 tablet (20 mg total) by mouth once. Patient taking differently: Take 20 mg by mouth daily as needed (swelling).  09/20/13   Troy Sine, MD   BP 146/57 mmHg  Pulse 71  Temp(Src) 98 F (36.7 C) (Oral)  Resp 20  Ht 6\' 1"  (1.854 m)  Wt 268 lb (121.564 kg)  BMI 35.37 kg/m2  SpO2 97%  Vital  signs normal   Physical Exam  Constitutional: He is oriented to person, place, and time. He appears well-developed and well-nourished.  Non-toxic appearance. He does not appear ill. No distress.  HENT:  Head: Normocephalic and atraumatic.  Right Ear: External ear normal.  Left Ear: External ear normal.  Nose: Nose normal. No mucosal edema or rhinorrhea.  Mouth/Throat: Oropharynx is clear and moist and mucous membranes are normal. No dental abscesses or uvula swelling.  Eyes: Conjunctivae and EOM are normal. Pupils are equal, round, and reactive to light.  Neck: Normal range of motion and full passive range of motion without pain. Neck supple.  Cardiovascular: Normal rate, regular rhythm and normal heart sounds.  Exam reveals no gallop and no friction rub.   No murmur heard. Pulmonary/Chest: Effort normal and breath sounds normal. No respiratory distress. He has no wheezes. He has no rhonchi. He has no rales. He exhibits no tenderness and no crepitus.  Abdominal: Soft. Normal appearance and bowel sounds are normal. He exhibits no distension. There is tenderness. There is no rebound and no guarding.    Patient is tender diffusely in the upper abdomen but he is most tender in the right upper quadrant with some mild guarding  Musculoskeletal: Normal range of motion. He exhibits no edema or tenderness.  Moves all extremities well.   Neurological: He is alert and oriented to person, place, and time. He has normal strength. No cranial nerve deficit.  Skin: Skin is warm, dry and intact. No rash noted. No erythema. No pallor.  Psychiatric: He has a normal mood and affect. His speech is normal and behavior is normal. His mood appears not anxious.  Nursing note and vitals reviewed.   ED Course  Procedures (including critical care time)  Medications  sodium chloride 0.9 % bolus 1,000 mL (1,000 mLs Intravenous New Bag/Given 08/08/14 0602)  fentaNYL (SUBLIMAZE) injection 50 mcg (50 mcg Intravenous  Given 08/08/14 0602)  ondansetron (ZOFRAN) injection 4 mg (4 mg Intravenous Given 08/08/14 0602)    Patient given IV fluids and IV nausea and pain medication.  Pt left at change of shift with Dr Regenia Skeeter, to get [place] of gallbladder for possible gallstones, if it is negative, may need AP CT scan.   Second troponin ordered.   Labs Review Results for orders placed or performed during the hospital encounter of 08/08/14  Comprehensive metabolic panel  Result Value Ref Range   Sodium 138 135 - 145 mmol/L   Potassium 4.1 3.5 - 5.1 mmol/L   Chloride 106 96 - 112 mmol/L   CO2 26 19 - 32 mmol/L   Glucose, Bld 135 (H) 70 - 99 mg/dL   BUN 20 6 -  23 mg/dL   Creatinine, Ser 1.24 0.50 - 1.35 mg/dL   Calcium 9.0 8.4 - 10.5 mg/dL   Total Protein 7.4 6.0 - 8.3 g/dL   Albumin 4.1 3.5 - 5.2 g/dL   AST 21 0 - 37 U/L   ALT 17 0 - 53 U/L   Alkaline Phosphatase 74 39 - 117 U/L   Total Bilirubin 1.3 (H) 0.3 - 1.2 mg/dL   GFR calc non Af Amer 59 (L) >90 mL/min   GFR calc Af Amer 68 (L) >90 mL/min   Anion gap 6 5 - 15  CBC with Differential  Result Value Ref Range   WBC 10.5 4.0 - 10.5 K/uL   RBC 4.98 4.22 - 5.81 MIL/uL   Hemoglobin 14.6 13.0 - 17.0 g/dL   HCT 43.7 39.0 - 52.0 %   MCV 87.8 78.0 - 100.0 fL   MCH 29.3 26.0 - 34.0 pg   MCHC 33.4 30.0 - 36.0 g/dL   RDW 14.1 11.5 - 15.5 %   Platelets 217 150 - 400 K/uL   Neutrophils Relative % 78 (H) 43 - 77 %   Neutro Abs 8.1 (H) 1.7 - 7.7 K/uL   Lymphocytes Relative 14 12 - 46 %   Lymphs Abs 1.5 0.7 - 4.0 K/uL   Monocytes Relative 7 3 - 12 %   Monocytes Absolute 0.8 0.1 - 1.0 K/uL   Eosinophils Relative 1 0 - 5 %   Eosinophils Absolute 0.1 0.0 - 0.7 K/uL   Basophils Relative 0 0 - 1 %   Basophils Absolute 0.0 0.0 - 0.1 K/uL  Troponin I  Result Value Ref Range   Troponin I <0.03 <0.031 ng/mL   Laboratory interpretation all normal     Imaging Review No results found.   EKG Interpretation   Date/Time:  Thursday August 08 2014 05:27:42  EST Ventricular Rate:  78 PR Interval:  152 QRS Duration: 85 QT Interval:  396 QTC Calculation: 451 R Axis:   66 Text Interpretation:  Sinus rhythm Normal ECG No significant change since  last tracing 14 Sep 2009 Confirmed by [facility]  MD-I, Candee Hoon ([id number]) on  08/08/2014 5:43:29 AM      MDM   Final diagnoses:  Upper abdominal pain  Nausea    Disposition pending   Rolland Porter, MD, Alanson Aly, MD 08/08/14 [phone]

## 2014-08-08 NOTE — ED Notes (Signed)
MD at bedside. 

## 2014-08-08 NOTE — ED Provider Notes (Signed)
Patient care transferred to me. New onset pancreatitis. CT with edema without abscess or other complication. RUQ ultrasound unremarkable. Admit for pain control, IV fluids.  Ephraim Hamburger, MD 08/08/14 [phone]

## 2014-08-08 NOTE — ED Notes (Signed)
Pt c/o upper abdominal pain radiating into the chest. Pt reports the pain increases severely with deep breaths. Pain radiates into sides of abdomen with deep breaths. Reports nausea but no emesis. Denies any diarrhea or difficulty using the bathroom.

## 2014-08-08 NOTE — ED Notes (Signed)
Attempted report 

## 2014-08-08 NOTE — ED Notes (Signed)
Pt requesting pain medication. Dr. Regenia Skeeter aware.

## 2014-08-08 NOTE — ED Notes (Signed)
Pt c/o abd pain that radiates into chest with nausea since last night.

## 2014-08-08 NOTE — ED Notes (Signed)
Pt informed of urine sample needed.

## 2014-08-08 NOTE — ED Notes (Signed)
persistent pain to upper abd- c/o " feeling like my stomach is swelled"  Denies any nausea. Family at bsd. cb at side. Will continue to monitor.

## 2014-08-09 DIAGNOSIS — R110 Nausea: Secondary | ICD-10-CM | POA: Insufficient documentation

## 2014-08-09 DIAGNOSIS — R1010 Upper abdominal pain, unspecified: Secondary | ICD-10-CM

## 2014-08-09 LAB — COMPREHENSIVE METABOLIC PANEL
ALT: 13 U/L (ref 0–53)
AST: 18 U/L (ref 0–37)
Albumin: 3.6 g/dL (ref 3.5–5.2)
Alkaline Phosphatase: 65 U/L (ref 39–117)
Anion gap: 7 (ref 5–15)
BUN: 17 mg/dL (ref 6–23)
CALCIUM: 8.7 mg/dL (ref 8.4–10.5)
CO2: 26 mmol/L (ref 19–32)
Chloride: 102 mmol/L (ref 96–112)
Creatinine, Ser: 1.13 mg/dL (ref 0.50–1.35)
GFR calc non Af Amer: 66 mL/min — ABNORMAL LOW (ref >90)
GFR, EST AFRICAN AMERICAN: 76 mL/min — AB (ref >90)
GLUCOSE: 90 mg/dL (ref 70–99)
Potassium: 3.5 mmol/L (ref 3.5–5.1)
Sodium: 135 mmol/L (ref 135–145)
TOTAL PROTEIN: 7 g/dL (ref 6.0–8.3)
Total Bilirubin: 2.7 mg/dL — ABNORMAL HIGH (ref 0.3–1.2)

## 2014-08-09 LAB — PROTIME-INR
INR: 1.11 (ref 0.00–1.49)
PROTHROMBIN TIME: 14.4 s (ref 11.6–15.2)

## 2014-08-09 LAB — LIPASE, BLOOD: Lipase: 74 U/L — ABNORMAL HIGH (ref 11–59)

## 2014-08-09 MED ORDER — SODIUM CHLORIDE 0.9 % IV SOLN: Freq: Once | INTRAVENOUS | Status: AC

## 2014-08-09 MED ORDER — ENOXAPARIN SODIUM 40 MG/0.4ML ~~LOC~~ SOLN
40.0000 mg | SUBCUTANEOUS | Status: DC
  Administered 2014-08-09 – 2014-08-14 (×6): 40 mg via SUBCUTANEOUS
  Filled 2014-08-09 (×7): qty 0.4

## 2014-08-09 MED ORDER — SODIUM CHLORIDE 0.9 % IV SOLN
INTRAVENOUS | Status: AC
  Administered 2014-08-09 – 2014-08-10 (×4): via INTRAVENOUS

## 2014-08-09 MED ORDER — OXYCODONE-ACETAMINOPHEN 5-325 MG PO TABS
1.0000 | ORAL_TABLET | Freq: Three times a day (TID) | ORAL | Status: DC | PRN
  Administered 2014-08-09 – 2014-08-11 (×4): 2 via ORAL
  Filled 2014-08-09 (×4): qty 2

## 2014-08-09 MED ORDER — HYDROMORPHONE HCL 1 MG/ML IJ SOLN
1.0000 mg | INTRAMUSCULAR | Status: DC | PRN
  Administered 2014-08-09: 1 mg via INTRAVENOUS
  Filled 2014-08-09: qty 1

## 2014-08-09 MED ORDER — SODIUM CHLORIDE 0.9 % IV SOLN
Freq: Once | INTRAVENOUS | Status: AC
  Administered 2014-08-09: 10:00:00 via INTRAVENOUS

## 2014-08-09 NOTE — Progress Notes (Signed)
[facility] PROGRESS NOTE  Maurice Munoz EUM:[id number] DOB: [birth year] DOA: 08/08/2014 PCP: Leonides Grills, MD  Assessment/Plan:  Pancreatitis- Etiology unclear. Ultrasound abdomen shows no stones and common bile duct of 3. 8 mm so this argues against potentially past stone. Pain slightly worse this am. Very tender on exam. CMET and Lipase pending. Will reorder IV fluids, clear liquids. Request GI consult.  Continue to hold lisinopril.   Active Problems:  CAD s/p stenting 2007, prior LAD h/o 199, last myoview 3/15 Low risk-continue metoprolol 50 twice a day. No chest pain. Continue Imdur 30, continue Plavix 75 od-lisinopril on hold as above-   Hyperlipidemia hold Zocor 20 mg daily at bedtime   Cervical disc disease s/p diskecktomy 2003 , lumbar disc disease status post laminectomy in the 80s-continue oxycodone 10/325 at a higher dose of q4 prn as will be holding Tylenol, continue Zanaflex 4 mg daily at bedtime.   Gout-stable at baseline. Continue allopurinol.   Code Status: full Family Communication: at bedside Disposition Plan: home when ready   Consultants:  GI  Procedures:  none  Antibiotics:  none  HPI/Subjective: Lying in bed. Reports worsening LLQ/LUQ pain particularly with deep breath.   Objective: Filed Vitals:   08/09/14 0420  BP: 142/60  Pulse: 84  Temp: 98.8 F (37.1 C)  Resp: 18    Intake/Output Summary (Last 24 hours) at 08/09/14 0928 Last data filed at 08/09/14 0900  Gross per 24 hour  Intake    480 ml  Output   2725 ml  Net  -2245 ml   Filed Weights   08/08/14 0528 08/08/14 1226  Weight: 121.564 kg (268 lb) 121.6 kg (268 lb 1.3 oz)    Exam:   General:  Obese appears slightly uncomfortable  Cardiovascular: RRR no MGR no LE edema  Respiratory: normal effort BS distant but clear, no wheeze  Abdomen: obese, slightly firm, very tender LUQ/LRQ very active BS   Musculoskeletal: joints without swelling/erythema   Data  Reviewed: Basic Metabolic Panel:  Recent Labs Lab 08/08/14 0554  NA 138  K 4.1  CL 106  CO2 26  GLUCOSE 135*  BUN 20  CREATININE 1.24  CALCIUM 9.0   Liver Function Tests:  Recent Labs Lab 08/08/14 0554  AST 21  ALT 17  ALKPHOS 74  BILITOT 1.3*  PROT 7.4  ALBUMIN 4.1    Recent Labs Lab 08/08/14 0554  LIPASE 1165*   No results for input(s): AMMONIA in the last 168 hours. CBC:  Recent Labs Lab 08/08/14 0554  WBC 10.5  NEUTROABS 8.1*  HGB 14.6  HCT 43.7  MCV 87.8  PLT 217   Cardiac Enzymes:  Recent Labs Lab 08/08/14 0554 08/08/14 0834  TROPONINI <0.03 <0.03   BNP (last 3 results) No results for input(s): BNP in the last 8760 hours.  ProBNP (last 3 results) No results for input(s): PROBNP in the last 8760 hours.  CBG: No results for input(s): GLUCAP in the last 168 hours.  No results found for this or any previous visit (from the past 240 hour(s)).   Studies: Ct Abdomen Pelvis W Contrast  08/08/2014   CLINICAL DATA:  Upper abdominal pain radiating to the chest. No indication of trauma.  EXAM: CT ABDOMEN AND PELVIS WITH CONTRAST  TECHNIQUE: Multidetector CT imaging of the abdomen and pelvis was performed using the standard protocol following bolus administration of intravenous contrast.  CONTRAST:  31mL OMNIPAQUE IOHEXOL 300 MG/ML SOLN, 156mL OMNIPAQUE IOHEXOL 300 MG/ML SOLN  COMPARISON:  03/19/2010  FINDINGS: BODY WALL: Unremarkable.  LOWER CHEST: Mild progression of reticular opacities in the lower lungs. No fibrotic change.  Coronary atherosclerosis visualized in the right circulation.  ABDOMEN/PELVIS:  Liver: No focal abnormality.  Biliary: No evidence of biliary obstruction or stone.  Pancreas: Edema in the retroperitoneum of the left upper quadrant and left flank is favored secondary to pancreatitis. There is no evidence of pancreatic necrosis or organized fluid collection.  Spleen: No abnormality to explain the neighboring edema.  Adrenals: Stable  14 nodule in the left adrenal gland, most consistent with adenoma.  Kidneys and ureters: No hydronephrosis or stone.  Bladder: Unremarkable.  Reproductive: Unremarkable.  Bowel: No obstruction. Normal appendix.  Retroperitoneum: No mass or adenopathy.  Peritoneum: No ascites or pneumoperitoneum.  Vascular: No acute abnormality. Diffuse atherosclerotic calcification.  OSSEOUS: Diffuse ankylosis of the spine, sparing L1-L2, L4-5, L5-S1 discs. 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 The sacroiliac joints are non eroded.  IMPRESSION: 1. Retroperitoneal edema most consistent with acute pancreatitis. No necrosis or fluid collection. 2. Left adrenal adenoma. 3. Spondyloarthropathy with diffuse spinal ankylosis.   Electronically Signed   By: Monte Fantasia M.D.   On: 08/08/2014 10:15   US Abdomen Limited  08/08/2014   CLINICAL DATA:  Nausea and right upper quadrant pain  EXAM: US ABDOMEN LIMITED - RIGHT UPPER QUADRANT  COMPARISON:  None.  FINDINGS: Gallbladder:  Gallbladder is contracted which accentuates the thickness of the gallbladder. No definitive cholelithiasis is noted. A negative sonographic Percell Miller sign is elicited.  Common bile duct:  Diameter: 3.8 mm.  Liver:  No focal lesion identified. Within normal limits in parenchymal echogenicity.  IMPRESSION: Contracted gallbladder without definitive cholelithiasis. No other focal abnormality is seen.   Electronically Signed   By: Inez Catalina M.D.   On: 08/08/2014 08:04    Scheduled Meds: . sodium chloride   Intravenous Once  . allopurinol  300 mg Oral Daily  . aspirin EC  81 mg Oral Daily  . clopidogrel  75 mg Oral Daily  . enoxaparin (LOVENOX) injection  40 mg Subcutaneous Q24H  . isosorbide mononitrate  30 mg Oral Daily  . metoprolol  50 mg Oral BID  . tiZANidine  4 mg Oral QHS   Continuous Infusions:   Principal Problem:   Pancreatitis-Idiopathy acute 2.18.16 admitted [facility] Active Problems:    CAD s/p stenting 2007, prior LAD h/o 199, last myoview 3/15 Low risk   Cervical disc disease s/p diskecktomy 2003    Gout    Time spent: 35 minutes    [facility] Pager [phone]. If 7PM-7AM, please contact night-coverage at [email or web address], password [facility] 08/09/2014, 9:28 AM  LOS: 1 day

## 2014-08-09 NOTE — Care Management Note (Addendum)
    Page 1 of 1   08/14/2014     2:56:07 PM CARE MANAGEMENT NOTE 08/14/2014  Patient:  Maurice Munoz, Maurice Munoz   Account Number:  [id number]  Date Initiated:  08/09/2014  Documentation initiated by:  Theophilus Kinds  Subjective/Objective Assessment:   Pt admitted from home with pancreatitis. Pt lives with his wife and will return home at discharge. Pt is fairly independent with ADL's. Pt has a cane that he uses prn.     Action/Plan:   No CM needs noted.   Anticipated DC Date:  08/12/2014   Anticipated DC Plan:        DC Planning Services  CM consult      Choice offered to / List presented to:             Status of service:  Completed, signed off Medicare Important Message given?  YES (If response is "NO", the following Medicare IM given date fields will be blank) Date Medicare IM given:  08/09/2014 Medicare IM given by:  Christinia Gully C Date Additional Medicare IM given:  08/14/2014 Additional Medicare IM given by:  Theophilus Kinds  Discharge Disposition:  HOME/SELF CARE  Per UR Regulation:    If discussed at Long Length of Stay Meetings, dates discussed:   08/13/2014    Comments:  08/14/14 [place], RN BSN CM Pt discharged home today. No CM needs noted.  08/09/14 [place], RN BSN CM

## 2014-08-09 NOTE — Progress Notes (Signed)
UR chart review completed.  

## 2014-08-09 NOTE — Consult Note (Signed)
Referring Provider: No ref. provider found Primary Care Physician:  Leonides Grills, MD Primary Gastroenterologist:  Dr. Gala Romney  Date of Admission: 08/08/14 Date of Consultation: 08/09/14  Reason for Consultation:  Acute pancreatitis  HPI:  69 year old male with a history of DJD, CAD without mention of CHF, htn, obesity, hyperlipidemia, and gout. Last EF 2014 65% per myocardial perfusion imaging study. Patient with Nausea since 2/14 which was tolerable and patient able to eat and take fluids po. On 2/17, ate BBQ sandwich x 2 at which point nausea became severe and had sudden onset of sharp periumbilical pain. Presented to ED after symptoms were persistent. Denies smoking, hasn't had any alcohol in 36 years, no recreational drugs.   In the ER he was given a 1L bolus of NS, Abd US showed contracted gallbladder without definitive cholelithiasis, negative sonographic Murphy's sign, CBD diameter 3.8 mm, no focal liver lesion identified. CT abd/pelvis with contrast shows no liver abnormality, no evidence of biliary obstruction or stone, pancreatic edema in the retroperitoneum of the LUQ and left flank favored secondary to pancreatitis, no evidence of pancreatic necrosis or organized fluid collection. Labs in the ER negative troponin x 2, CBC essentially normal, CBC normal except bili 1.3, lipase 1165. Admitted to the medical floor dx idiopathic pancreatitis on clear liquids and NS 75 cc/hr.  This morning patient states his pain is continued unchanged from yesterday, despite ordered diet has not eaten any clear liquids due to pain and fear or worsening pain. Pain is constant and dull with frequent exacerbations of sharp pain in the LUQ, left mid abdomen. Continued nausea, no vomiting. Typically has a bowel movement every 1-2 days which is normal, formed, and soft. Deneis hematochezia and melena. Confirms has not had any alcohol in 36 years. Last lipid panel 2011 with triglycerides 94. Denies subjective  fever, chills, unintentional weight loss, change in appetite. No other upper or lower GI symptoms.   Past Medical History  Diagnosis Date  . CAD (coronary artery disease) 1999  . DDD (degenerative disc disease)   . HTN (hypertension)   . Obesity   . Hyperlipidemia   . Gout   . Peripheral edema     Past Surgical History  Procedure Laterality Date  . Back surgery      r/t DDD  . Cardiac catheterization      RCA, PDA  . Cardiac catheterization  april 2007    stenting LAD, distal RCA    Prior to Admission medications   Medication Sig Start Date End Date Taking? Authorizing Provider  acetaminophen (TYLENOL) 500 MG tablet Take 1,000 mg by mouth 2 (two) times daily as needed for moderate pain.   Yes Historical Provider, MD  allopurinol (ZYLOPRIM) 300 MG tablet Take 300 mg by mouth daily.  11/06/12  Yes Historical Provider, MD  aspirin EC 81 MG tablet Take 81 mg by mouth daily.   Yes Historical Provider, MD  clopidogrel (PLAVIX) 75 MG tablet TAKE ONE (1) TABLET EACH DAY 06/19/13  Yes Troy Sine, MD  isosorbide mononitrate (IMDUR) 30 MG 24 hr tablet Take 1 tablet (30 mg total) by mouth daily. 12/10/13  Yes Troy Sine, MD  lisinopril (PRINIVIL,ZESTRIL) 20 MG tablet TAKE ONE-HALF TABLET DAILY 05/24/14  Yes Troy Sine, MD  metoprolol (LOPRESSOR) 50 MG tablet Take 1 tablet (50 mg total) by mouth 2 (two) times daily. 08/22/13  Yes Troy Sine, MD  oxyCODONE-acetaminophen (PERCOCET) 10-325 MG per tablet Take 1 tablet by mouth every  8 (eight) hours as needed for pain.  10/11/12  Yes Historical Provider, MD  predniSONE (DELTASONE) 10 MG tablet Take 20 mg by mouth 2 (two) times daily as needed (GOUT FLARES).   Yes Historical Provider, MD  simvastatin (ZOCOR) 20 MG tablet TAKE ONE TABLET BY MOUTH EVERY NIGHT AT BEDTIME   Yes Troy Sine, MD  tiZANidine (ZANAFLEX) 4 MG tablet Take 4 mg by mouth at bedtime.  11/08/12  Yes Historical Provider, MD  torsemide (DEMADEX) 20 MG tablet Take 1  tablet (20 mg total) by mouth once. Patient taking differently: Take 20 mg by mouth daily as needed (swelling).  09/20/13  Yes Troy Sine, MD    Current Facility-Administered Medications  Medication Dose Route Frequency Provider Last Rate Last Dose  . allopurinol (ZYLOPRIM) tablet 300 mg  300 mg Oral Daily Nita Sells, MD   300 mg at 08/09/14 0930  . aspirin EC tablet 81 mg  81 mg Oral Daily Nita Sells, MD   81 mg at 08/09/14 0930  . clopidogrel (PLAVIX) tablet 75 mg  75 mg Oral Daily Nita Sells, MD   75 mg at 08/08/14 1251  . enoxaparin (LOVENOX) injection 40 mg  40 mg Subcutaneous Q24H Radene Gunning, NP   40 mg at 08/09/14 1007  . hydrALAZINE (APRESOLINE) injection 5 mg  5 mg Intravenous Q6H PRN Nita Sells, MD      . isosorbide mononitrate (IMDUR) 24 hr tablet 30 mg  30 mg Oral Daily Nita Sells, MD   30 mg at 08/09/14 0930  . metoprolol (LOPRESSOR) tablet 50 mg  50 mg Oral BID Nita Sells, MD   50 mg at 08/09/14 0930  . oxyCODONE (Oxy IR/ROXICODONE) immediate release tablet 5 mg  5 mg Oral Q4H PRN Nita Sells, MD   5 mg at 08/09/14 0930  . predniSONE (DELTASONE) tablet 20 mg  20 mg Oral BID PRN Nita Sells, MD      . tiZANidine (ZANAFLEX) tablet 4 mg  4 mg Oral QHS Nita Sells, MD   4 mg at 08/08/14 2137    Allergies as of 08/08/2014 - Review Complete 08/08/2014  Allergen Reaction Noted  . Ibuprofen Itching   . Neosporin [neomycin-bacitracin zn-polymyx] Swelling 05/11/2014  . Penicillins Other (See Comments)     History reviewed. No pertinent family history.  History   Social History  . Marital Status: Married    Spouse Name: N/A  . Number of Children: N/A  . Years of Education: N/A   Occupational History  . Not on file.   Social History Main Topics  . Smoking status: Former Smoker    Types: Cigarettes    Quit date: 11/27/1988  . Smokeless tobacco: Not on file  . Alcohol Use: No  . Drug Use: No   . Sexual Activity: Not on file   Other Topics Concern  . Not on file   Social History Narrative   Previously used used to work on a farm   Currently retired secondary to chronic back pains-   Used to be a Art therapist   Married to his wife and lives in [place]   Father died at age 78 lung cancer   Mother had CAD   Wife had cancer    Review of Systems: Gen: Denies fever, chills, loss of appetite, change in weight or weight loss CV: Denies chest pain, heart palpitations, syncope, edema  Resp: Denies shortness of breath with rest, wheezing GI: See HPI.  Denies dysphagia or odynophagia. Denies vomiting blood, jaundice, and fecal incontinence.  Derm: Denies rash, itching, dry skin Psych: Denies depression, anxiety,confusion, or memory loss Heme: Denies bruising, bleeding, and enlarged lymph nodes.  Physical Exam: Vital signs in last 24 hours: Temp:  [98.4 F (36.9 C)-99.5 F (37.5 C)] 98.8 F (37.1 C) (02/19 0420) Pulse Rate:  [71-94] 84 (02/19 0420) Resp:  [16-20] 18 (02/19 0420) BP: (131-180)/(60-98) 142/60 mmHg (02/19 0420) SpO2:  [96 %-100 %] 97 % (02/19 0420) Weight:  [268 lb 1.3 oz (121.6 kg)] 268 lb 1.3 oz (121.6 kg) (02/18 1226) Last BM Date: 08/07/14 General:   Alert,  Well-developed, well-nourished, pleasant and cooperative. Appears in pain with frequent exacerbations of pain wherein he grabs his abdomen and winces. Head:  Normocephalic and atraumatic. Eyes:  Sclera clear, no icterus. Conjunctiva pink. Ears:  Normal auditory acuity. Neck:  Supple; no masses or thyromegaly. Lungs:  Clear throughout to auscultation.   No wheezes, crackles, or rhonchi. No acute distress. Heart:  Regular rate and rhythm; no murmurs, clicks, rubs,  or gallops. Abdomen:  Firm, distended. Severe TTP LUQ, left mid-abdomen. Positive guarding. No masses, hepatosplenomegaly or hernias noted. Normal bowel sounds, without rebound.   Rectal:  Deferred.   Pulses:   Normal pulses noted. Extremities:  Without clubbing. Mild non-pitting edema bilateral LE at the ankles/feet Neurologic:  Alert and  oriented x4;  grossly normal neurologically. Skin:  Intact without significant lesions or rashes. Psych:  Alert and cooperative. Normal mood and affect.  Intake/Output from previous day: 02/18 0701 - 02/19 0700 In: 480 [P.O.:480] Out: 2325 [Urine:2325] Intake/Output this shift: Total I/O In: 120 [P.O.:120] Out: 400 [Urine:400]  Lab Results:  Recent Labs  08/08/14 0554  WBC 10.5  HGB 14.6  HCT 43.7  PLT 217   BMET  Recent Labs  08/08/14 0554  NA 138  K 4.1  CL 106  CO2 26  GLUCOSE 135*  BUN 20  CREATININE 1.24  CALCIUM 9.0   LFT  Recent Labs  08/08/14 0554  PROT 7.4  ALBUMIN 4.1  AST 21  ALT 17  ALKPHOS 74  BILITOT 1.3*   PT/INR No results for input(s): LABPROT, INR in the last 72 hours. Hepatitis Panel No results for input(s): HEPBSAG, HCVAB, HEPAIGM, HEPBIGM in the last 72 hours. C-Diff No results for input(s): CDIFFTOX in the last 72 hours.  Studies/Results: Ct Abdomen Pelvis W Contrast  08/08/2014   CLINICAL DATA:  Upper abdominal pain radiating to the chest. No indication of trauma.  EXAM: CT ABDOMEN AND PELVIS WITH CONTRAST  TECHNIQUE: Multidetector CT imaging of the abdomen and pelvis was performed using the standard protocol following bolus administration of intravenous contrast.  CONTRAST:  35mL OMNIPAQUE IOHEXOL 300 MG/ML SOLN, 136mL OMNIPAQUE IOHEXOL 300 MG/ML SOLN  COMPARISON:  03/19/2010  FINDINGS: BODY WALL: Unremarkable.  LOWER CHEST: Mild progression of reticular opacities in the lower lungs. No fibrotic change.  Coronary atherosclerosis visualized in the right circulation.  ABDOMEN/PELVIS:  Liver: No focal abnormality.  Biliary: No evidence of biliary obstruction or stone.  Pancreas: Edema in the retroperitoneum of the left upper quadrant and left flank is favored secondary to pancreatitis. There is no evidence  of pancreatic necrosis or organized fluid collection.  Spleen: No abnormality to explain the neighboring edema.  Adrenals: Stable 14 nodule in the left adrenal gland, most consistent with adenoma.  Kidneys and ureters: No hydronephrosis or stone.  Bladder: Unremarkable.  Reproductive: Unremarkable.  Bowel: No obstruction. Normal appendix.  Retroperitoneum: No mass or adenopathy.  Peritoneum: No ascites or pneumoperitoneum.  Vascular: No acute abnormality. Diffuse atherosclerotic calcification.  OSSEOUS: Diffuse ankylosis of the spine, sparing L1-L2, L4-5, L5-S1 discs. 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 The sacroiliac joints are non eroded.  IMPRESSION: 1. Retroperitoneal edema most consistent with acute pancreatitis. No necrosis or fluid collection. 2. Left adrenal adenoma. 3. Spondyloarthropathy with diffuse spinal ankylosis.   Electronically Signed   By: Monte Fantasia M.D.   On: 08/08/2014 10:15   US Abdomen Limited  08/08/2014   CLINICAL DATA:  Nausea and right upper quadrant pain  EXAM: US ABDOMEN LIMITED - RIGHT UPPER QUADRANT  COMPARISON:  None.  FINDINGS: Gallbladder:  Gallbladder is contracted which accentuates the thickness of the gallbladder. No definitive cholelithiasis is noted. A negative sonographic Percell Miller sign is elicited.  Common bile duct:  Diameter: 3.8 mm.  Liver:  No focal lesion identified. Within normal limits in parenchymal echogenicity.  IMPRESSION: Contracted gallbladder without definitive cholelithiasis. No other focal abnormality is seen.   Electronically Signed   By: Inez Catalina M.D.   On: 08/08/2014 08:04    Impression: 69 year old male with acute, uncomplicated, idiopathic pancreatitis. Diameter of CBD and imaging argues against obstruction. Has not had alcohol in 36 years. Not tolerating po at this time, BUN high normal at 20. Missed 24 hour window for aggressive hydration. Possible severe hypertriglyceridemia with  no lipid panel in 4 years.  Plan: 1. This morning's lipase pending, will follow for results 2. Recheck Lipase, CMP in the morning 3. Check lipid panel in the morning for severe elevation in triglycerides 4. Increase IVF to NS at 200/hr for 24 hours and reassess progress. 5. Change to NPO for now 6. Continue pain and nausea management with IV medications 7. Reassess tomorrow for possible diet advancement and other diagnostic studies warranted. 8. May consider outpatient MRCP for further evaluation 6-8 weeks after discharge   Walden Field, AGNP-C Adult & Gerontological Nurse Practitioner [facility]    LOS: 1 day     08/09/2014, 10:21 AM  Attending note:  Patient is seen and examined. Agree with above assessment and recommendations. Etiology of pancreatitis not clear. Agree with subsequent outpatient imaging once inflammation has subsided. May still derive benefit from additional aggressive IV fluid hydration over the next 24 hours.

## 2014-08-10 DIAGNOSIS — E669 Obesity, unspecified: Secondary | ICD-10-CM

## 2014-08-10 DIAGNOSIS — M10379 Gout due to renal impairment, unspecified ankle and foot: Secondary | ICD-10-CM

## 2014-08-10 LAB — HEPATIC FUNCTION PANEL
ALBUMIN: 3.4 g/dL — AB (ref 3.5–5.2)
ALK PHOS: 66 U/L (ref 39–117)
ALT: 12 U/L (ref 0–53)
AST: 18 U/L (ref 0–37)
BILIRUBIN DIRECT: 0.4 mg/dL (ref 0.0–0.5)
BILIRUBIN INDIRECT: 1.9 mg/dL — AB (ref 0.3–0.9)
TOTAL PROTEIN: 7.2 g/dL (ref 6.0–8.3)
Total Bilirubin: 2.3 mg/dL — ABNORMAL HIGH (ref 0.3–1.2)

## 2014-08-10 LAB — COMPREHENSIVE METABOLIC PANEL
ALBUMIN: 3.4 g/dL — AB (ref 3.5–5.2)
ALT: 13 U/L (ref 0–53)
AST: 18 U/L (ref 0–37)
Alkaline Phosphatase: 66 U/L (ref 39–117)
Anion gap: 8 (ref 5–15)
BILIRUBIN TOTAL: 2.6 mg/dL — AB (ref 0.3–1.2)
BUN: 17 mg/dL (ref 6–23)
CO2: 27 mmol/L (ref 19–32)
CREATININE: 1.07 mg/dL (ref 0.50–1.35)
Calcium: 9 mg/dL (ref 8.4–10.5)
Chloride: 104 mmol/L (ref 96–112)
GFR calc non Af Amer: 70 mL/min — ABNORMAL LOW (ref >90)
GFR, EST AFRICAN AMERICAN: 82 mL/min — AB (ref >90)
GLUCOSE: 78 mg/dL (ref 70–99)
POTASSIUM: 3.9 mmol/L (ref 3.5–5.1)
Sodium: 139 mmol/L (ref 135–145)
Total Protein: 7.2 g/dL (ref 6.0–8.3)

## 2014-08-10 LAB — LIPID PANEL
Cholesterol: 133 mg/dL (ref 0–200)
HDL: 32 mg/dL — ABNORMAL LOW (ref >39)
LDL CALC: 77 mg/dL (ref 0–99)
Total CHOL/HDL Ratio: 4.2 RATIO
Triglycerides: 122 mg/dL (ref <150)
VLDL: 24 mg/dL (ref 0–40)

## 2014-08-10 LAB — LIPASE, BLOOD: Lipase: 31 U/L (ref 11–59)

## 2014-08-10 LAB — LACTATE DEHYDROGENASE: LDH: 184 U/L (ref 94–250)

## 2014-08-10 MED ORDER — SODIUM CHLORIDE 0.9 % IV SOLN
INTRAVENOUS | Status: AC
  Administered 2014-08-10 – 2014-08-11 (×4): via INTRAVENOUS

## 2014-08-10 MED ORDER — HYDROCHLOROTHIAZIDE 12.5 MG PO CAPS
12.5000 mg | ORAL_CAPSULE | Freq: Every day | ORAL | Status: DC
  Administered 2014-08-10 – 2014-08-14 (×5): 12.5 mg via ORAL
  Filled 2014-08-10 (×5): qty 1

## 2014-08-10 NOTE — Progress Notes (Signed)
Notified lab that patient has an order for smear.  Lab stated that smear could not be completed without order for CBC.  Notified MD.

## 2014-08-10 NOTE — Progress Notes (Addendum)
[facility] PROGRESS NOTE  Maurice Munoz PPI:[id number] DOB: Sep 24, 1945 DOA: 08/08/2014 PCP: Leonides Grills, MD  69 y/o ? Known degenerative disc disease, both cervical and lumbar, CAD status post stenting 2007 prior history intervention 1996, last Myoview 3/15 low risk, gout, obesity-has lost over 100 pounds recently admitted to [facility] ED 2/18 with abdominal pain Patient started around 11 PM 2/17 central abdomen non-radiating Found to have Lipase >100 and clinically acute pancreatitis    Pancreatitis- Etiology unclear.  TG=122 Ultrasound abdomen shows no stones and common bile duct of 3. 8 mm.  Pain improved since admit to some extent Lipase trending down Placed on Ice chips Appreciate GI input Needs OP MRCP  Deranged LFT's Indirect Bili ? =? Hemolysis Get LDH, REtic in am Gilberts possible-usually not assoc with Abd pain? Consider Nicotinic acid test per GI   CAD s/p stenting 2007, prior LAD h/o 199, last myoview 3/15 Low risk- continue metoprolol 50 twice a day. No chest pain.\  Continue Imdur 30, continue Plavix 75 od-lisinopril on hold as above   Hyperlipidemia hold Zocor 20 mg daily at bedtime   Cervical disc disease s/p discecktomy 2003 , lumbar disc disease status post laminectomy in the 80 -continue oxycodone 10/325 at a higher dose of q4 prn as will be holding Tylenol,  continue Zanaflex 4 mg daily at bedtime.   Gout-stable at baseline. Continue allopurinol.   Code Status: full Family Communication: at bedside Disposition Plan: home when ready   Consultants:  GI  Procedures:  none  Antibiotics:  none  HPI/Subjective:  doing good Still abd pain over LLQ  Epig tenderness has improved  Objective: Filed Vitals:   08/10/14 1449  BP: 151/67  Pulse: 85  Temp: 97.5 F (36.4 C)  Resp: 18    Intake/Output Summary (Last 24 hours) at 08/10/14 1726 Last data filed at 08/10/14 1300  Gross per 24 hour  Intake   2400 ml   Output    775 ml  Net   1625 ml   Filed Weights   08/08/14 0528 08/08/14 1226  Weight: 121.564 kg (268 lb) 121.6 kg (268 lb 1.3 oz)    Exam:   General:  Obese appears slightly uncomfortable  Cardiovascular: RRR no MGR no LE edema  Respiratory: normal effort BS distant but clear, no wheeze  Abdomen: obese, slightly firm, very tender LUQ/LRQ very active BS   Musculoskeletal: joints without swelling/erythema   Data Reviewed: Basic Metabolic Panel:  Recent Labs Lab 08/08/14 0554 08/09/14 1012 08/10/14 0555  NA 138 135 139  K 4.1 3.5 3.9  CL 106 102 104  CO2 26 26 27   GLUCOSE 135* 90 78  BUN 20 17 17   CREATININE 1.24 1.13 1.07  CALCIUM 9.0 8.7 9.0   Liver Function Tests:  Recent Labs Lab 08/08/14 0554 08/09/14 1012 08/10/14 0555  AST 21 18 18  18   ALT 17 13 12  13   ALKPHOS 74 65 66  66  BILITOT 1.3* 2.7* 2.3*  2.6*  PROT 7.4 7.0 7.2  7.2  ALBUMIN 4.1 3.6 3.4*  3.4*    Recent Labs Lab 08/08/14 0554 08/09/14 1012 08/10/14 0555  LIPASE 1165* 74* 31   No results for input(s): AMMONIA in the last 168 hours. CBC:  Recent Labs Lab 08/08/14 0554  WBC 10.5  NEUTROABS 8.1*  HGB 14.6  HCT 43.7  MCV 87.8  PLT 217   Cardiac Enzymes:  Recent Labs Lab 08/08/14 0554 08/08/14 0834  TROPONINI <0.03 <0.03  BNP (last 3 results) No results for input(s): BNP in the last 8760 hours.  ProBNP (last 3 results) No results for input(s): PROBNP in the last 8760 hours.  CBG: No results for input(s): GLUCAP in the last 168 hours.  No results found for this or any previous visit (from the past 240 hour(s)).   Studies: No results found.  Scheduled Meds: . allopurinol  300 mg Oral Daily  . aspirin EC  81 mg Oral Daily  . clopidogrel  75 mg Oral Daily  . enoxaparin (LOVENOX) injection  40 mg Subcutaneous Q24H  . hydrochlorothiazide  12.5 mg Oral Daily  . isosorbide mononitrate  30 mg Oral Daily  . metoprolol  50 mg Oral BID  . tiZANidine  4 mg  Oral QHS   Continuous Infusions:   Principal Problem:   Pancreatitis-Idiopathy acute 2.18.16 admitted [facility] Active Problems:   CAD s/p stenting 2007, prior LAD h/o 199, last myoview 3/15 Low risk   Obesity (BMI 30-39.9)   Cervical disc disease s/p diskecktomy 2003    Gout   Nausea   Upper abdominal pain    Time spent: 25 minutes   Verneita Griffes, MD [facility] [phone]

## 2014-08-10 NOTE — Progress Notes (Signed)
Patient still complains of left mid abdominal pain significantly worse when he takes a deep breath. He remains nothing by mouth.  Vital signs in last 24 hours: Temp:  [97.5 F (36.4 C)-98.6 F (37 C)] 97.5 F (36.4 C) (02/20 0611) Pulse Rate:  [78-90] 90 (02/20 0611) Resp:  [18-20] 18 (02/20 0611) BP: (118-151)/(56-62) 151/62 mmHg (02/20 0611) SpO2:  [95 %] 95 % (02/20 0611) Last BM Date: 08/07/14 General:  Alert, pleasant. Appears in no acute distress whatsoever, multiple visitors present. Abdomen:  Obese. He has infrequent bowel sounds. He remains focally tender in the epigastrium and left mid abdomen. No rebound. No obvious mass.  Extremities:  Without clubbing or edema.    Intake/Output from previous day: 02/19 0701 - 02/20 0700 In: 2640 [P.O.:240; I.V.:2400] Out: 1300 [Urine:1300] Intake/Output this shift:    Lab Results:  Recent Labs  08/08/14 0554  WBC 10.5  HGB 14.6  HCT 43.7  PLT 217   BMET  Recent Labs  08/08/14 0554 08/09/14 1012 08/10/14 0555  NA 138 135 139  K 4.1 3.5 3.9  CL 106 102 104  CO2 26 26 27   GLUCOSE 135* 90 78  BUN 20 17 17   CREATININE 1.24 1.13 1.07  CALCIUM 9.0 8.7 9.0   LFT  Recent Labs  08/10/14 0555  PROT 7.2  ALBUMIN 3.4*  AST 18  ALT 13  ALKPHOS 66  BILITOT 2.6*   PT/INR  Recent Labs  08/09/14 1012  LABPROT 14.4  INR 1.11  Studies/Results: Ct Abdomen Pelvis W Contrast  08/08/2014   CLINICAL DATA:  Upper abdominal pain radiating to the chest. No indication of trauma.  EXAM: CT ABDOMEN AND PELVIS WITH CONTRAST  TECHNIQUE: Multidetector CT imaging of the abdomen and pelvis was performed using the standard protocol following bolus administration of intravenous contrast.  CONTRAST:  31mL OMNIPAQUE IOHEXOL 300 MG/ML SOLN, 12mL OMNIPAQUE IOHEXOL 300 MG/ML SOLN  COMPARISON:  03/19/2010  FINDINGS: BODY WALL: Unremarkable.  LOWER CHEST: Mild progression of reticular opacities in the lower lungs. No fibrotic change.  Coronary  atherosclerosis visualized in the right circulation.  ABDOMEN/PELVIS:  Liver: No focal abnormality.  Biliary: No evidence of biliary obstruction or stone.  Pancreas: Edema in the retroperitoneum of the left upper quadrant and left flank is favored secondary to pancreatitis. There is no evidence of pancreatic necrosis or organized fluid collection.  Spleen: No abnormality to explain the neighboring edema.  Adrenals: Stable 14 nodule in the left adrenal gland, most consistent with adenoma.  Kidneys and ureters: No hydronephrosis or stone.  Bladder: Unremarkable.  Reproductive: Unremarkable.  Bowel: No obstruction. Normal appendix.  Retroperitoneum: No mass or adenopathy.  Peritoneum: No ascites or pneumoperitoneum.  Vascular: No acute abnormality. Diffuse atherosclerotic calcification.  OSSEOUS: Diffuse ankylosis of the spine, sparing L1-L2, L4-5, L5-S1 discs. 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 The sacroiliac joints are non eroded.  IMPRESSION: 1. Retroperitoneal edema most consistent with acute pancreatitis. No necrosis or fluid collection. 2. Left adrenal adenoma. 3. Spondyloarthropathy with diffuse spinal ankylosis.   Electronically Signed   By: Monte Fantasia M.D.   On: 08/08/2014 10:15    Impression: Pleasant 69 year old gentleman with uncomplicated pancreatitis.  Idiopathic in origin at this time. Mildly elevated total bilirubin nonspecific and may not be significant given negative hepatobiliary imaging and normal aminotransferases, alkaline phosphatase (query Gilbert's).  Renal function remains normal.  Recommendations:  Keep nothing by mouth except ice chips for another 24 hours  Fractionate bilirubin.  Subsequent MRI once over acute illness  We can now back off on IV fluids (decrease to 125 mL/h).

## 2014-08-11 DIAGNOSIS — M10011 Idiopathic gout, right shoulder: Secondary | ICD-10-CM

## 2014-08-11 DIAGNOSIS — K859 Acute pancreatitis, unspecified: Principal | ICD-10-CM

## 2014-08-11 MED ORDER — HYDROMORPHONE HCL 1 MG/ML IJ SOLN
1.0000 mg | INTRAMUSCULAR | Status: DC | PRN
  Filled 2014-08-11: qty 1

## 2014-08-11 MED ORDER — OXYCODONE-ACETAMINOPHEN 5-325 MG PO TABS: 1.0000 | ORAL_TABLET | Freq: Three times a day (TID) | ORAL | Status: DC | PRN

## 2014-08-11 MED ORDER — ONDANSETRON HCL 4 MG/2ML IJ SOLN
4.0000 mg | Freq: Four times a day (QID) | INTRAMUSCULAR | Status: AC
  Administered 2014-08-11 – 2014-08-12 (×6): 4 mg via INTRAVENOUS
  Filled 2014-08-11 (×6): qty 2

## 2014-08-11 MED ORDER — HYDROMORPHONE HCL 1 MG/ML IJ SOLN
1.5000 mg | INTRAMUSCULAR | Status: DC | PRN
  Administered 2014-08-11 – 2014-08-14 (×5): 1.5 mg via INTRAVENOUS
  Filled 2014-08-11 (×5): qty 2

## 2014-08-11 MED ORDER — TRAMADOL HCL 50 MG PO TABS
50.0000 mg | ORAL_TABLET | Freq: Four times a day (QID) | ORAL | Status: DC
  Administered 2014-08-11 – 2014-08-14 (×13): 50 mg via ORAL
  Filled 2014-08-11 (×13): qty 1

## 2014-08-11 MED ORDER — HYDROMORPHONE BOLUS VIA INFUSION: 1.0000 mg | Freq: Every day | INTRAVENOUS | Status: DC

## 2014-08-11 MED ORDER — HYDROMORPHONE HCL 1 MG/ML IJ SOLN
1.0000 mg | Freq: Every day | INTRAMUSCULAR | Status: DC
  Administered 2014-08-11 (×2): 1 mg via INTRAVENOUS
  Filled 2014-08-11: qty 1

## 2014-08-11 MED ORDER — SODIUM CHLORIDE 0.9 % IV SOLN
INTRAVENOUS | Status: DC
  Administered 2014-08-11 – 2014-08-12 (×2): via INTRAVENOUS

## 2014-08-11 MED ORDER — PREDNISONE 20 MG PO TABS
20.0000 mg | ORAL_TABLET | Freq: Every day | ORAL | Status: DC
  Administered 2014-08-12: 20 mg via ORAL
  Filled 2014-08-11: qty 1

## 2014-08-11 MED ORDER — PROMETHAZINE HCL 12.5 MG PO TABS: 12.5000 mg | ORAL_TABLET | Freq: Four times a day (QID) | ORAL | Status: DC | PRN

## 2014-08-11 NOTE — Progress Notes (Addendum)
Patient states pain unchanged. 2 out of 10 while still 9 out of 10 with a deep breath.. More nausea today. Patient also complains of a flare of gout.  Oral analgesics not working very well for him today.   Vital signs in last 24 hours: Temp:  [97.5 F (36.4 C)-99.1 F (37.3 C)] 99.1 F (37.3 C) (02/21 0511) Pulse Rate:  [82-94] 82 (02/21 0511) Resp:  [18-20] 20 (02/21 0511) BP: (124-156)/(60-67) 156/60 mmHg (02/21 0511) SpO2:  [98 %-99 %] 99 % (02/21 0511) Last BM Date: 08/07/14 General:   Alert,   pleasant and cooperative in NAD. Somewhat anxious. Abdomen:  Obese. Positive bowel sounds. Soft with tenderness to  left mid and left upper quadrant palpation. No mass. Extremities:  Without clubbing or edema.    Intake/Output from previous day: 02/20 0701 - 02/21 0700 In: 1771.3 [I.V.:1771.3] Out: 925 [Urine:925] Intake/Output this shift: Total I/O In: -  Out: 450 [Urine:450]  Lab Results: No results for input(s): WBC, HGB, HCT, PLT in the last 72 hours. BMET  Recent Labs  08/09/14 1012 08/10/14 0555  NA 135 139  K 3.5 3.9  CL 102 104  CO2 26 27  GLUCOSE 90 78  BUN 17 17  CREATININE 1.13 1.07  CALCIUM 8.7 9.0   LFT  Recent Labs  08/10/14 0555  PROT 7.2  7.2  ALBUMIN 3.4*  3.4*  AST 18  18  ALT 12  13  ALKPHOS 66  66  BILITOT 2.3*  2.6*  BILIDIR 0.4  IBILI 1.9*   PT/INR  Recent Labs  08/09/14 1012  LABPROT 14.4  INR 1.11    Impression:  Acute, uncomplicated pancreatitis-etiology not well defined. He does have ongoing abdominal pain; he may have an attack of gout as well. I have discussed with Dr. Verlon Au. He needs parenteral analgesics as well as anti-emetic therapy. 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  Recommendations:   Let's keep him nothing by mouth another 24 hours    Zofran 4 mg IV every 6 hours 4 doses-scheduled    Dilaudid 1  mg IV every 4 hours when necessary pain    Hold off on Phenergan.    Discontinue Percocet.

## 2014-08-11 NOTE — Progress Notes (Signed)
Pt's BP 126/55 with a HR of 66. MD made aware and gave orders to hold Lopressor 50 mg.

## 2014-08-11 NOTE — Progress Notes (Addendum)
[facility] PROGRESS NOTE  Maurice Munoz PYK:[id number] DOB: 1945/10/20 DOA: 08/08/2014 PCP: Leonides Grills, MD  69 y/o ? Known degenerative disc disease, both cervical and lumbar, CAD status post stenting 2007 prior history intervention 1996, last Myoview 3/15 low risk, gout, obesity-has lost over 100 pounds recently admitted to [facility] ED 2/18 with abdominal pain Patient started around 11 PM 2/17 central abdomen non-radiating Found to have Lipase >100 and clinically acute pancreatitis Patient was slow to progress and continued to have abdominal pain therefore gastroenterology was consulted    Pancreatitis- Etiology unclear.  TG=122 Ultrasound abdomen shows no stones and common bile duct of 3. 8 mm.  Lipase trending down Placed on Ice chips Cut back IV saline 1 50 cc per hour 2/21--> 75 cc per hour Appreciate GI input Needs OP MRCP Patient resistant to taking pain medications 2/21-developed nausea and therefore patient was started on scheduled Zofran scheduled IV Dilaudid Pain reasonably controlled now and not nauseous. Hope for resolution in the next 24 hours and graduation of diet   Deranged LFT's Indirect Bili ? =? Hemolysis LDH 140 non-suggestive of hemolysis Gilberts possible-usually not assoc with Abd pain?   CAD s/p stenting 2007, prior LAD h/o 199, last myoview 3/15 Low risk- continue metoprolol 50 twice a day. No chest pain.  Continue Imdur 30, continue Plavix 75 od-lisinopril on hold as above   Hyperlipidemia hold Zocor 20 mg daily at bedtime   Cervical disc disease s/p discecktomy 2003 , lumbar disc disease status post laminectomy in the 80 -continue oxycodone 10/325 at a higher dose of q4 prn as will be holding Tylenol,  continue Zanaflex 4 mg daily at bedtime.   Gout-stable at baseline. Patient allopurinol discontinued in setting of acute gout [can worsen gout flare]-given steroid burst-tapered prednisone 40-->20 mg  Code Status:  full Family Communication: at bedside Disposition Plan: home when ready   Consultants:  GI  Procedures:  none  Antibiotics:  none  HPI/Subjective:  Lower abdominal pain still of lower quadrant Tolerating ice chips okay Pain this morning was 8/10 and in the left knee Wife states that this happens usually when he lays around and doesn't move around  Objective: Filed Vitals:   08/11/14 1445  BP: 126/62  Pulse: 80  Temp: 98.7 F (37.1 C)  Resp: 20    Intake/Output Summary (Last 24 hours) at 08/11/14 1727 Last data filed at 08/11/14 1319  Gross per 24 hour  Intake 2628.33 ml  Output   1300 ml  Net 1328.33 ml   Filed Weights   08/08/14 0528 08/08/14 1226  Weight: 121.564 kg (268 lb) 121.6 kg (268 lb 1.3 oz)    Exam:   General:  Obese appears slightly uncomfortable  Cardiovascular: RRR no MGR no LE edema  Respiratory: normal effort BS distant but clear, no wheeze  Abdomen: obese, slightly firm, very tender LUQ/LRQ very active BS   Musculoskeletal: joints without swelling/erythema   Data Reviewed: Basic Metabolic Panel:  Recent Labs Lab 08/08/14 0554 08/09/14 1012 08/10/14 0555  NA 138 135 139  K 4.1 3.5 3.9  CL 106 102 104  CO2 26 26 27   GLUCOSE 135* 90 78  BUN 20 17 17   CREATININE 1.24 1.13 1.07  CALCIUM 9.0 8.7 9.0   Liver Function Tests:  Recent Labs Lab 08/08/14 0554 08/09/14 1012 08/10/14 0555  AST 21 18 18  18   ALT 17 13 12  13   ALKPHOS 74 65 66  66  BILITOT 1.3* 2.7*  2.3*  2.6*  PROT 7.4 7.0 7.2  7.2  ALBUMIN 4.1 3.6 3.4*  3.4*    Recent Labs Lab 08/08/14 0554 08/09/14 1012 08/10/14 0555  LIPASE 1165* 74* 31   No results for input(s): AMMONIA in the last 168 hours. CBC:  Recent Labs Lab 08/08/14 0554  WBC 10.5  NEUTROABS 8.1*  HGB 14.6  HCT 43.7  MCV 87.8  PLT 217   Cardiac Enzymes:  Recent Labs Lab 08/08/14 0554 08/08/14 0834  TROPONINI <0.03 <0.03   BNP (last 3 results) No results for  input(s): BNP in the last 8760 hours.  ProBNP (last 3 results) No results for input(s): PROBNP in the last 8760 hours.  CBG: No results for input(s): GLUCAP in the last 168 hours.  No results found for this or any previous visit (from the past 240 hour(s)).   Studies: No results found.  Scheduled Meds: . allopurinol  300 mg Oral Daily  . aspirin EC  81 mg Oral Daily  . clopidogrel  75 mg Oral Daily  . enoxaparin (LOVENOX) injection  40 mg Subcutaneous Q24H  . hydrochlorothiazide  12.5 mg Oral Daily  . isosorbide mononitrate  30 mg Oral Daily  . metoprolol  50 mg Oral BID  . ondansetron (ZOFRAN) IV  4 mg Intravenous 4 times per day  . tiZANidine  4 mg Oral QHS  . traMADol  50 mg Oral 4 times per day   Continuous Infusions: . sodium chloride 150 mL/hr at 08/11/14 1242    Principal Problem:   Pancreatitis-Idiopathy acute 2.18.16 admitted [facility] Active Problems:   CAD s/p stenting 2007, prior LAD h/o 199, last myoview 3/15 Low risk   Obesity (BMI 30-39.9)   Cervical disc disease s/p diskecktomy 2003    Gout   Nausea   Upper abdominal pain    Time spent: 15 minutes   Verneita Griffes, MD [facility] [phone]

## 2014-08-12 DIAGNOSIS — M10062 Idiopathic gout, left knee: Secondary | ICD-10-CM

## 2014-08-12 LAB — BASIC METABOLIC PANEL
Anion gap: 8 (ref 5–15)
BUN: 19 mg/dL (ref 6–23)
CALCIUM: 8.9 mg/dL (ref 8.4–10.5)
CO2: 28 mmol/L (ref 19–32)
Chloride: 101 mmol/L (ref 96–112)
Creatinine, Ser: 0.9 mg/dL (ref 0.50–1.35)
GFR calc Af Amer: >90 mL/min (ref >90)
GFR calc non Af Amer: 87 mL/min — ABNORMAL LOW (ref >90)
GLUCOSE: 90 mg/dL (ref 70–99)
POTASSIUM: 3.9 mmol/L (ref 3.5–5.1)
Sodium: 137 mmol/L (ref 135–145)

## 2014-08-12 MED ORDER — ALLOPURINOL 100 MG PO TABS
100.0000 mg | ORAL_TABLET | Freq: Every day | ORAL | Status: DC
  Administered 2014-08-13 – 2014-08-14 (×2): 100 mg via ORAL
  Filled 2014-08-12 (×2): qty 1

## 2014-08-12 MED ORDER — PREDNISONE 20 MG PO TABS
40.0000 mg | ORAL_TABLET | Freq: Every day | ORAL | Status: DC
  Administered 2014-08-12 – 2014-08-14 (×3): 40 mg via ORAL
  Filled 2014-08-12 (×3): qty 2

## 2014-08-12 MED ORDER — PREDNISONE 20 MG PO TABS: 40.0000 mg | ORAL_TABLET | Freq: Every day | ORAL | Status: DC

## 2014-08-12 MED ORDER — PANTOPRAZOLE SODIUM 40 MG PO TBEC
40.0000 mg | DELAYED_RELEASE_TABLET | Freq: Every day | ORAL | Status: DC
  Administered 2014-08-12 – 2014-08-14 (×3): 40 mg via ORAL
  Filled 2014-08-12 (×3): qty 1

## 2014-08-12 MED ORDER — COLCHICINE 0.6 MG PO TABS
0.6000 mg | ORAL_TABLET | Freq: Two times a day (BID) | ORAL | Status: DC
  Administered 2014-08-12: 0.6 mg via ORAL
  Filled 2014-08-12: qty 1

## 2014-08-12 NOTE — Progress Notes (Signed)
[facility] PROGRESS NOTE  KAMIN NIBLACK ZOX:[id number] DOB: May 11, 1946 DOA: 08/08/2014 PCP: Leonides Grills, MD  69 y/o ? Known degenerative disc disease, both cervical and lumbar, CAD status post stenting 2007 prior history intervention 1996, last Myoview 3/15 low risk, gout, obesity-has lost over 100 pounds recently admitted to [facility] ED 2/18 with abdominal pain Patient started around 11 PM 2/17 central abdomen non-radiating Found to have Lipase >100 and clinically acute pancreatitis Patient was slow to progress and continued to have abdominal pain therefore gastroenterology was consulted , hospital course complicated by left knee pain consistent with his prior history of gout-allopurinol discontinued, started transiently on colchicine which gives him stomach upset and subsequently transitioned to prednisone burst 40 mg    Pancreatitis- Etiology unclear.  TG=122 Ultrasound abdomen shows no stones and common bile duct of 3. 8 mm.  Lipase trending down Initially on ice chips, 250 cc per hour IV saline which was subsequently tapered and then discontinued 2/22 Needs OP MRCP Patient resistant to taking pain medications 2/21-developed nausea and therefore patient was started on scheduled Zofran scheduled IV Dilaudid--these were tapered to just prednisone and tramadol 50 every 8 Allopurinol dose cut back from 300 -->100 mg as can paradoxically worsen gout uated to clear liquids 2/22    Deranged LFT's Indirect Bili ? =? Hemolysis LDH 140 non-suggestive of hemolysis Gilberts possible-usually not assoc with Abd pain?   CAD s/p stenting 2007, prior LAD h/o 199, last myoview 3/15 Low risk- continue metoprolol 50 twice a day. No chest pain.  Continue Imdur 30, continue Plavix 75 od-lisinopril on hold as above   Hyperlipidemia hold Zocor 20 mg daily at bedtime   Cervical disc disease s/p discecktomy 2003 , lumbar disc disease status post laminectomy in the  80 -continue oxycodone 10/325 at a higher dose of q4 prn as will be holding Tylenol,  continue Zanaflex 4 mg daily at bedtime.   Gout-stable at baseline. Patient allopurinol cutback 2/22 acute gout [can worsen gout flare]-given steroid burst-tapered prednisone 40 milligrams daily  Code Status: full Family Communication: at bedside Disposition Plan: home when ready   Consultants:  GI  Procedures:  none  Antibiotics:  none  HPI/Subjective:   abdominal pain 3/10 Tolerating clears well Significant erythema and redness to left knee  Objective: Filed Vitals:   08/12/14 1611  BP: 113/64  Pulse: 72  Temp: 98.9 F (37.2 C)  Resp: 20    Intake/Output Summary (Last 24 hours) at 08/12/14 1742 Last data filed at 08/12/14 1613  Gross per 24 hour  Intake 1601.25 ml  Output   1225 ml  Net 376.25 ml   Filed Weights   08/08/14 0528 08/08/14 1226  Weight: 121.564 kg (268 lb) 121.6 kg (268 lb 1.3 oz)    Exam:   General:  Obese appears slightly uncomfortable  Cardiovascular: RRR no MGR no LE edema  Respiratory: normal effort BS distant but clear, no wheeze  Abdomen: obese, slightly firm, very tender LUQ/LRQ very active BS   MusSwelling and discomfort with limited range of motion left knee    Data Reviewed: Basic Metabolic Panel:  Recent Labs Lab 08/08/14 0554 08/09/14 1012 08/10/14 0555 08/12/14 0610  NA 138 135 139 137  K 4.1 3.5 3.9 3.9  CL 106 102 104 101  CO2 26 26 27 28   GLUCOSE 135* 90 78 90  BUN 20 17 17 19   CREATININE 1.24 1.13 1.07 0.90  CALCIUM 9.0 8.7 9.0 8.9   Liver Function Tests:  Recent Labs Lab 08/08/14 0554 08/09/14 1012 08/10/14 0555  AST 21 18 18  18   ALT 17 13 12  13   ALKPHOS 74 65 66  66  BILITOT 1.3* 2.7* 2.3*  2.6*  PROT 7.4 7.0 7.2  7.2  ALBUMIN 4.1 3.6 3.4*  3.4*    Recent Labs Lab 08/08/14 0554 08/09/14 1012 08/10/14 0555  LIPASE 1165* 74* 31   No results for input(s): AMMONIA in the last 168  hours. CBC:  Recent Labs Lab 08/08/14 0554  WBC 10.5  NEUTROABS 8.1*  HGB 14.6  HCT 43.7  MCV 87.8  PLT 217   Cardiac Enzymes:  Recent Labs Lab 08/08/14 0554 08/08/14 0834  TROPONINI <0.03 <0.03   BNP (last 3 results) No results for input(s): BNP in the last 8760 hours.  ProBNP (last 3 results) No results for input(s): PROBNP in the last 8760 hours.  CBG: No results for input(s): GLUCAP in the last 168 hours.  No results found for this or any previous visit (from the past 240 hour(s)).   Studies: No results found.  Scheduled Meds: . allopurinol  300 mg Oral Daily  . aspirin EC  81 mg Oral Daily  . clopidogrel  75 mg Oral Daily  . enoxaparin (LOVENOX) injection  40 mg Subcutaneous Q24H  . hydrochlorothiazide  12.5 mg Oral Daily  . isosorbide mononitrate  30 mg Oral Daily  . metoprolol  50 mg Oral BID  . pantoprazole  40 mg Oral Daily  . predniSONE  40 mg Oral QAC breakfast  . tiZANidine  4 mg Oral QHS  . traMADol  50 mg Oral 4 times per day   Continuous Infusions: . sodium chloride 75 mL/hr at 08/12/14 1610    Principal Problem:   Pancreatitis-Idiopathy acute 2.18.16 admitted [facility] Active Problems:   CAD s/p stenting 2007, prior LAD h/o 199, last myoview 3/15 Low risk   Obesity (BMI 30-39.9)   Cervical disc disease s/p diskecktomy 2003    Gout   Nausea   Upper abdominal pain    Time spent: 15 minutes   Verneita Griffes, MD [facility] [phone]

## 2014-08-12 NOTE — Progress Notes (Addendum)
    Subjective: Abdominal pain much improved, stating this is the best he has felt. Main complaint of left knee pain secondary to gout. Wants to advance diet to clears. No N/V.   Objective: Vital signs in last 24 hours: Temp:  [97.8 F (36.6 C)-98.7 F (37.1 C)] 98 F (36.7 C) (02/22 0515) Pulse Rate:  [63-94] 63 (02/22 0515) Resp:  [20] 20 (02/22 0515) BP: (126-179)/(38-68) 134/38 mmHg (02/22 0515) SpO2:  [94 %-96 %] 95 % (02/22 0515) Last BM Date: 08/07/14 General:   Alert and oriented, pleasant Head:  Normocephalic and atraumatic. Abdomen:  Bowel sounds present, soft, obese, non-tender, non-distended. Difficult to appreciate HSM due to larger AP diameter.  Msk:  Symmetrical without gross deformities. Normal posture. Extremities:  Pedal edema, 1+ lower extremity edema Neurologic:  Alert and  oriented x4 Psych:  Alert and cooperative. Normal mood and affect.  Intake/Output from previous day: 02/21 0701 - 02/22 0700 In: 857.1 [I.V.:857.1] Out: 1300 [Urine:1300] Intake/Output this shift:    BMET  Recent Labs  08/09/14 1012 08/10/14 0555 08/12/14 0610  NA 135 139 137  K 3.5 3.9 3.9  CL 102 104 101  CO2 26 27 28   GLUCOSE 90 78 90  BUN 17 17 19   CREATININE 1.13 1.07 0.90  CALCIUM 8.7 9.0 8.9   LFT  Recent Labs  08/09/14 1012 08/10/14 0555  PROT 7.0 7.2  7.2  ALBUMIN 3.6 3.4*  3.4*  AST 18 18  18   ALT 13 12  13   ALKPHOS 65 66  66  BILITOT 2.7* 2.3*  2.6*  BILIDIR  --  0.4  IBILI  --  1.9*   PT/INR  Recent Labs  08/09/14 1012  LABPROT 14.4  INR 1.11   Lab Results  Component Value Date   LIPASE 31 08/10/2014     Assessment: 69 year old male admitted with acute, uncomplicated pancreatitis. Unknown etiology at this point, and he will need outpatient imaging via MRI/MRCP in approximately 8 weeks. Clinically improved since admission and appropriate for advancement to clear liquids.   Acute gout flare: unclear ordering provider for initial  dosing of prednisone (gastroenterology did not order), but further dosing per hospitalist as appropriate.   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   Plan: Clear liquid diet Add PPI while on prednisone Outpatient MRI/MRCP in 8 weeks Will continue to follow with you  Orvil Feil, ANP-BC [facility]     LOS: 4 days    08/12/2014, 7:46 AM    Attending note: Patient seen and examined. Agree with above assessment and recommendations.

## 2014-08-13 ENCOUNTER — Encounter: Payer: Self-pay | Admitting: Gastroenterology

## 2014-08-13 ENCOUNTER — Telehealth: Payer: Self-pay | Admitting: Gastroenterology

## 2014-08-13 NOTE — Telephone Encounter (Signed)
APPOINTMENT MADE AND LETTER SENT °

## 2014-08-13 NOTE — Progress Notes (Signed)
[facility] PROGRESS NOTE  Maurice Munoz XTK:[id number] DOB: [birth year] DOA: 08/08/2014 PCP: Leonides Grills, MD  70 y/o ? Known degenerative disc disease, both cervical and lumbar, CAD status post stenting 2007 prior history intervention 1996, last Myoview 3/15 low risk, gout, obesity-has lost over 100 pounds recently admitted to [facility] ED 2/18 with abdominal pain Patient started around 11 PM 2/17 central abdomen non-radiating Found to have Lipase >100 and clinically acute pancreatitis Patient was slow to progress and continued to have abdominal pain therefore gastroenterology was consulted , hospital course complicated by left knee pain consistent with his prior history of gout-allopurinol discontinued, started transiently on colchicine which gives him stomach upset and subsequently transitioned to prednisone burst 40 mg    Pancreatitis- Etiology unclear.  TG=122 Ultrasound abdomen shows no stones and common bile duct of 3. 8 mm.  Lipase trending down Initially on ice chips, 250 cc per hour IV saline which was subsequently tapered and then discontinued 2/22 Needs OP MRCP Patient resistant to taking pain medications 2/21-developed nausea and therefore patient was started on scheduled Zofran scheduled IV Dilaudid--these were tapered as below to just    Deranged LFT's Indirect Bili ? =? Hemolysis LDH 140 non-suggestive of hemolysis Gilberts possible-usually not assoc with Abd pain?   CAD s/p stenting 2007, prior LAD h/o 199, last myoview 3/15 Low risk- continue metoprolol 50 twice a day. No chest pain.  Continue Imdur 30, continue Plavix 75 od-lisinopril on hold as above   Hyperlipidemia hold Zocor 20 mg daily at bedtime   Cervical disc disease s/p discecktomy 2003 , lumbar disc disease status post laminectomy in the 80 -continue oxycodone 10/325 at a higher dose of q4 prn as will be holding Tylenol,  continue Zanaflex 4 mg daily at bedtime.    Gout-minimally improved-pain still 4/10 without movement and 9/10 with ambulation today and had to use a walker which is outside of his normal -prednisone 40 and tramadol 50 every 8 Allopurinol dose cut back from 300 -->100 mg as can paradoxically worsen gout   Body mass index is 35.38 kg/(m^2). patient states he has lost 50 pounds intentionally. He was congratulated and encouraged not to eat too much red meat as xanthines and purines in meat can potentiate gout   Code Status: full Family Communication: at bedside Disposition Plan: Likely can discharge home 2/20 4:16 AM with burst of steroids-7 days Needs outpatient MRCP which has already arranged by gastroenterology Will also need diabetic/weight loss tips-   Consultants:  GI  Procedures:  none  Antibiotics:  none  HPI/Subjective:  Abdominal pain 0/10 Had fried chicken for lunch About eat [place] sandwich chips and full meal Pain noted in knee-still warm Has had injections in the past by Dr. Aline Brochure which have not really helped He does think that his pain improved compared to yesterday No nausea no vomiting No shortness of breath  Objective: Filed Vitals:   08/13/14 1532  BP: 137/67  Pulse: 64  Temp: 98 F (36.7 C)  Resp: 20    Intake/Output Summary (Last 24 hours) at 08/13/14 1656 Last data filed at 08/13/14 1500  Gross per 24 hour  Intake    720 ml  Output   2400 ml  Net  -1680 ml   Filed Weights   08/08/14 0528 08/08/14 1226  Weight: 121.564 kg (268 lb) 121.6 kg (268 lb 1.3 oz)    Exam:   General:  Obese appears slightly uncomfortable  Cardiovascular: RRR no MGR no LE  edema  Respiratory: normal effort BS distant but clear, no wheeze  Abdomen: obese, slightly firm, very tender LUQ/LRQ very active BS   Left knee red swollen but decreased swelling compared to 2/22 . Range of motion however limited secondary to pain       Data Reviewed: Basic Metabolic Panel:  Recent Labs Lab  08/08/14 0554 08/09/14 1012 08/10/14 0555 08/12/14 0610  NA 138 135 139 137  K 4.1 3.5 3.9 3.9  CL 106 102 104 101  CO2 26 26 27 28   GLUCOSE 135* 90 78 90  BUN 20 17 17 19   CREATININE 1.24 1.13 1.07 0.90  CALCIUM 9.0 8.7 9.0 8.9   Liver Function Tests:  Recent Labs Lab 08/08/14 0554 08/09/14 1012 08/10/14 0555  AST 21 18 18  18   ALT 17 13 12  13   ALKPHOS 74 65 66  66  BILITOT 1.3* 2.7* 2.3*  2.6*  PROT 7.4 7.0 7.2  7.2  ALBUMIN 4.1 3.6 3.4*  3.4*    Recent Labs Lab 08/08/14 0554 08/09/14 1012 08/10/14 0555  LIPASE 1165* 74* 31   No results for input(s): AMMONIA in the last 168 hours. CBC:  Recent Labs Lab 08/08/14 0554  WBC 10.5  NEUTROABS 8.1*  HGB 14.6  HCT 43.7  MCV 87.8  PLT 217   Cardiac Enzymes:  Recent Labs Lab 08/08/14 0554 08/08/14 0834  TROPONINI <0.03 <0.03   BNP (last 3 results) No results for input(s): BNP in the last 8760 hours.  ProBNP (last 3 results) No results for input(s): PROBNP in the last 8760 hours.  CBG: No results for input(s): GLUCAP in the last 168 hours.  No results found for this or any previous visit (from the past 240 hour(s)).   Studies: No results found.  Scheduled Meds: . allopurinol  100 mg Oral Daily  . aspirin EC  81 mg Oral Daily  . clopidogrel  75 mg Oral Daily  . enoxaparin (LOVENOX) injection  40 mg Subcutaneous Q24H  . hydrochlorothiazide  12.5 mg Oral Daily  . isosorbide mononitrate  30 mg Oral Daily  . metoprolol  50 mg Oral BID  . pantoprazole  40 mg Oral Daily  . predniSONE  40 mg Oral QAC breakfast  . tiZANidine  4 mg Oral QHS  . traMADol  50 mg Oral 4 times per day   Continuous Infusions:    Principal Problem:   Pancreatitis-Idiopathy acute 2.18.16 admitted [facility] Active Problems:   CAD s/p stenting 2007, prior LAD h/o 199, last myoview 3/15 Low risk   Obesity (BMI 30-39.9)   Cervical disc disease s/p diskecktomy 2003    Gout   Nausea   Upper abdominal pain    Time  spent: 15 minutes   Verneita Griffes, MD [facility] [phone]

## 2014-08-13 NOTE — Progress Notes (Signed)
Pt ambulated in hallway with standby assist from nursing student. Pt tolerated well. Continues to c/o gout pain in left knee with extension and flexion. MD aware.

## 2014-08-13 NOTE — Telephone Encounter (Signed)
Noted will arrange in late March or early April

## 2014-08-13 NOTE — Telephone Encounter (Signed)
Pt has office appt. On 10/14/2014.

## 2014-08-13 NOTE — Progress Notes (Signed)
UR chart review completed.  

## 2014-08-13 NOTE — Telephone Encounter (Signed)
NEEDS MRI/MRCP AT TIME OF APPOINTMENT   APPOINTMENT MADE AFOR 10/14/14

## 2014-08-13 NOTE — Progress Notes (Signed)
    Subjective: Denies abdominal pain, tolerating clear liquids, no N/V. Only complaint is gout. Sitting up at sink shaving.   Objective: Vital signs in last 24 hours: Temp:  [97.5 F (36.4 C)-98.9 F (37.2 C)] 97.5 F (36.4 C) (02/23 0505) Pulse Rate:  [62-87] 62 (02/23 0505) Resp:  [20] 20 (02/23 0505) BP: (113-157)/(56-79) 115/56 mmHg (02/23 0505) SpO2:  [94 %-98 %] 98 % (02/23 0505) Last BM Date: 08/07/14 General:   Alert and oriented, pleasant Head:  Normocephalic and atraumatic. Eyes:  No icterus, sclera clear. Conjuctiva pink.  Abdomen:  Limited exam, patient sitting up at sink. Bowel sounds present, soft, non-tender, non-distended. Obese Neurologic:  Alert and  oriented x Psych:  Alert and cooperative. Normal mood and affect.  Intake/Output from previous day: 02/22 0701 - 02/23 0700 In: 1841.3 [P.O.:240; I.V.:1601.3] Out: 1325 [Urine:1325] Intake/Output this shift:   BMET  Recent Labs  08/12/14 0610  NA 137  K 3.9  CL 101  CO2 28  GLUCOSE 90  BUN 19  CREATININE 0.90  CALCIUM 8.9   Assessment: 69 year old male admitted with acute, uncomplicated pancreatitis. Unknown etiology at this point, and he will need outpatient imaging via MRI/MRCP in approximately 8 weeks. Clinically improved since admission, tolerating clear liquids, appropriate for diet advancement.   Acute gout flare: per attending.   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   Plan: Advance diet to low fat PPI while on prednisone for gout Outpatient MRI/MRCP in 8 weeks Hopeful discharge soon if tolerating diet  Orvil Feil, ANP-BC [facility]    LOS: 5 days    08/13/2014, 8:04 AM

## 2014-08-13 NOTE — Telephone Encounter (Signed)
Admitted with idiopathic pancreatitis. Please have MRI/MRCP set up for about 8 weeks from now. Needs office visit with [place] at that time as well.

## 2014-08-14 DIAGNOSIS — M1000 Idiopathic gout, unspecified site: Secondary | ICD-10-CM

## 2014-08-14 DIAGNOSIS — K858 Other acute pancreatitis: Secondary | ICD-10-CM

## 2014-08-14 MED ORDER — INDOMETHACIN ER 75 MG PO CPCR
75.0000 mg | ORAL_CAPSULE | Freq: Two times a day (BID) | ORAL | Status: DC
  Filled 2014-08-14 (×4): qty 1

## 2014-08-14 MED ORDER — PREDNISONE 10 MG PO TABS: ORAL_TABLET | ORAL | Status: DC

## 2014-08-14 MED ORDER — PANTOPRAZOLE SODIUM 40 MG PO TBEC: 40.0000 mg | DELAYED_RELEASE_TABLET | Freq: Every day | ORAL | Status: DC

## 2014-08-14 MED ORDER — INDOMETHACIN 50 MG PO CAPS: 50.0000 mg | ORAL_CAPSULE | Freq: Two times a day (BID) | ORAL | Status: DC

## 2014-08-14 NOTE — Plan of Care (Signed)
Problem: Food- and Nutrition-Related Knowledge Deficit (NB-1.1) Goal: Nutrition education Formal process to instruct or train a patient/client in a skill or to impart knowledge to help patients/clients voluntarily manage or modify food choices and eating behavior to maintain or improve health. Outcome: Completed/Met Date Met:  08/14/14 Nutrition Education Note  RD consulted for nutrition education regarding a Low fat diet  RD provided "Pacreatitis Nutrition Therapy" handout from the Academy of [employer]etics. Reviewed patient's dietary recall. Patient states he eats pork, beef, chicken and fried fish. Mentioned that baked fish and chicken are his best choices right now and to avoid frying foods and fattier meats like pork/beef. Pt said he does not drink milk, but does like ice cream. Told pt to either avoid ice cream right now or choose ice creams that arent made with 2%/whole milk. Pt states he does not drink coffee w/ cream or alcohol.   Provided examples on ways to decrease fat intake in diet. Discouraged intake of frieds foods and use of butter. Teach back method used.  Expect good compliance.  Body mass index is 35.38 kg/(m^2). Pt meets criteria for obese based on current BMI.  Current diet order is heart healthy. There is no meal intake recorded at this time. Labs and medications reviewed. No further nutrition interventions warranted at this time. RD contact information provided. If additional nutrition issues arise, please re-consult RD.  Burtis Junes RD, LDN Nutrition Pager: [phone] 08/14/2014 2:22 PM

## 2014-08-14 NOTE — Progress Notes (Signed)
Pt discharged home today per Dr. Hernandez. Pt's IV site D/C'd and WDL. Pt's VSS. Pt provided with home medication list, discharge instructions and prescriptions. Verbalized understanding. Pt left floor via WC in stable condition accompanied by NT. 

## 2014-08-14 NOTE — Discharge Summary (Signed)
Physician Discharge Summary  Maurice Munoz FXT:[id number] DOB: [birth year] DOA: 08/08/2014  PCP: Leonides Grills, MD  Admit date: 08/08/2014 Discharge date: 08/14/2014  Time spent: 40 minutes  Recommendations for Outpatient Follow-up:  1. Follow up with PCP 1 week for evaluation of gout flare 2. Call dr Coralee North office for scheduling of MRCP to follow up on pancreatitis  Discharge Diagnoses:  Principal Problem:   Pancreatitis-Idiopathy acute 2.18.16 admitted [facility] Active Problems:   CAD s/p stenting 2007, prior LAD h/o 199, last myoview 3/15 Low risk   Obesity (BMI 30-39.9)   Cervical disc disease s/p diskecktomy 2003    Gout   Nausea   Upper abdominal pain   Discharge Condition: stable  Diet recommendation: heart healthy  Filed Weights   08/08/14 0528 08/08/14 1226  Weight: 121.564 kg (268 lb) 121.6 kg (268 lb 1.3 oz)    History of present illness:  69 y/o ? Known degenerative disc disease, both cervical and lumbar, CAD status post stenting 2007 prior history intervention 1996, last Myoview 3/15 low risk, gout, obesity-has lost over 100 pounds recently admitted to [facility] ED 2/18 with abdominal pain. pain started around 11 PM 2/17 central abdomen nonradiating. Dissimilar from his prior heart pains. Felt nauseous, took Carafate which did not really help. Poor by mouth intake. No diaphoresis, no rash, no fever, no sputum, no diarrhea, no dysuria, Pain was consistent and constant without intermittent features-range in intensity from 8 to 10 on 10 Progressed in intensity until 0300 08/08/14 when patient presented to the emergency room for evaluation. Has not touched alcohol for 36 years Nonsmoker 20 years No new medications No sick contacts with viral illnesses  Hospital Course:  Pancreatitis- Etiology unclear. TG=122 Ultrasound abdomen shows no stones and common bile duct of 3. 8 mm. No ETOH in 30 years. Lipase 1165 on admission trended down to 74.  Evaluated by GI  who recommended medical management with fluids and OP MRCP. Tolerating diet at discharge. Abdominal pain resolved    Gilbert's. Seen by GI who opine no further work up needed   CAD s/p stenting 2007, prior LAD h/o 199, last myoview 3/15 Low risk- continue metoprolol 50 twice a day. No chest pain.   Hyperlipidemia:    Cervical disc disease s/p discecktomy 2003 , lumbar disc disease status post laminectomy in the 80. Stable at baseline.   Gout-prednisone 40 and indocin. On allopurinol at home   Body mass index is 35.38 kg/(m^2). patient states he has lost 50 pounds intentionally.     Procedures:  none  Consultations:  GI  Discharge Exam: Filed Vitals:   08/14/14 0610  BP: 156/62  Pulse: 62  Temp: 97.8 F (36.6 C)  Resp: 19    General: obese appears somewhat uncomfortable Cardiovascular: RRR no MGR no LE edema Respiratory: normal effort BS clear bilaterally no wheeze Ms: left leg with no erythema, mild swelling and very tender from knee to foot.   Discharge Instructions    Current Discharge Medication List    START taking these medications   Details  indomethacin (INDOCIN) 50 MG capsule Take 1 capsule (50 mg total) by mouth 2 (two) times daily with a meal. Qty: 30 capsule, Refills: 0    pantoprazole (PROTONIX) 40 MG tablet Take 1 tablet (40 mg total) by mouth daily. Qty: 15 tablet, Refills: 0      CONTINUE these medications which have CHANGED   Details  predniSONE (DELTASONE) 10 MG tablet Take 4 tabs daily for 6 more  days then resume home regimen. Qty: 24 tablet, Refills: o      CONTINUE these medications which have NOT CHANGED   Details  acetaminophen (TYLENOL) 500 MG tablet Take 1,000 mg by mouth 2 (two) times daily as needed for moderate pain.    allopurinol (ZYLOPRIM) 300 MG tablet Take 300 mg by mouth daily.     aspirin EC 81 MG tablet Take 81 mg by mouth daily.    clopidogrel (PLAVIX) 75 MG tablet TAKE ONE (1) TABLET EACH DAY Qty: 90  tablet, Refills: 3    isosorbide mononitrate (IMDUR) 30 MG 24 hr tablet Take 1 tablet (30 mg total) by mouth daily. Qty: 90 tablet, Refills: 1    lisinopril (PRINIVIL,ZESTRIL) 20 MG tablet TAKE ONE-HALF TABLET DAILY Qty: 30 tablet, Refills: 2    metoprolol (LOPRESSOR) 50 MG tablet Take 1 tablet (50 mg total) by mouth 2 (two) times daily. Qty: 180 tablet, Refills: 2    oxyCODONE-acetaminophen (PERCOCET) 10-325 MG per tablet Take 1 tablet by mouth every 8 (eight) hours as needed for pain.     simvastatin (ZOCOR) 20 MG tablet TAKE ONE TABLET BY MOUTH EVERY NIGHT AT BEDTIME Qty: 30 tablet, Refills: 2    tiZANidine (ZANAFLEX) 4 MG tablet Take 4 mg by mouth at bedtime.     torsemide (DEMADEX) 20 MG tablet Take 1 tablet (20 mg total) by mouth once. Qty: 30 tablet, Refills: 6       Allergies  Allergen Reactions  . Ibuprofen Itching  . Neosporin [Neomycin-Bacitracin Zn-Polymyx] Swelling  . Penicillins Other (See Comments)    Patient doesn't remember reaction   Follow-up Information    Follow up with Leonides Grills, MD. Schedule an appointment as soon as possible for a visit in 1 week.   Specialty:  Family Medicine   Why:  for evaluation of gout flare   Contact information:   [place] [address] [phone]       Follow up with Manus Rudd, MD. Call in 3 weeks.   Specialty:  Gastroenterology   Why:  to follow up on scheduling OP procedure   Contact information:   [address] [place]  [address] [phone]        The results of significant diagnostics from this hospitalization (including imaging, microbiology, ancillary and laboratory) are listed below for reference.    Significant Diagnostic Studies: Ct Abdomen Pelvis W Contrast  08/08/2014   CLINICAL DATA:  Upper abdominal pain radiating to the chest. No indication of trauma.  EXAM: CT ABDOMEN AND PELVIS WITH CONTRAST  TECHNIQUE: Multidetector CT imaging of the abdomen and pelvis was  performed using the standard protocol following bolus administration of intravenous contrast.  CONTRAST:  31mL OMNIPAQUE IOHEXOL 300 MG/ML SOLN, 158mL OMNIPAQUE IOHEXOL 300 MG/ML SOLN  COMPARISON:  03/19/2010  FINDINGS: BODY WALL: Unremarkable.  LOWER CHEST: Mild progression of reticular opacities in the lower lungs. No fibrotic change.  Coronary atherosclerosis visualized in the right circulation.  ABDOMEN/PELVIS:  Liver: No focal abnormality.  Biliary: No evidence of biliary obstruction or stone.  Pancreas: Edema in the retroperitoneum of the left upper quadrant and left flank is favored secondary to pancreatitis. There is no evidence of pancreatic necrosis or organized fluid collection.  Spleen: No abnormality to explain the neighboring edema.  Adrenals: Stable 14 nodule in the left adrenal gland, most consistent with adenoma.  Kidneys and ureters: No hydronephrosis or stone.  Bladder: Unremarkable.  Reproductive: Unremarkable.  Bowel: No obstruction. Normal appendix.  Retroperitoneum:  No mass or adenopathy.  Peritoneum: No ascites or pneumoperitoneum.  Vascular: No acute abnormality. Diffuse atherosclerotic calcification.  OSSEOUS: Diffuse ankylosis of the spine, sparing L1-L2, L4-5, L5-S1 discs. 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 The sacroiliac joints are non eroded.  IMPRESSION: 1. Retroperitoneal edema most consistent with acute pancreatitis. No necrosis or fluid collection. 2. Left adrenal adenoma. 3. Spondyloarthropathy with diffuse spinal ankylosis.   Electronically Signed   By: Monte Fantasia M.D.   On: 08/08/2014 10:15   US Abdomen Limited  08/08/2014   CLINICAL DATA:  Nausea and right upper quadrant pain  EXAM: US ABDOMEN LIMITED - RIGHT UPPER QUADRANT  COMPARISON:  None.  FINDINGS: Gallbladder:  Gallbladder is contracted which accentuates the thickness of the gallbladder. No definitive cholelithiasis is noted. A negative sonographic Percell Miller  sign is elicited.  Common bile duct:  Diameter: 3.8 mm.  Liver:  No focal lesion identified. Within normal limits in parenchymal echogenicity.  IMPRESSION: Contracted gallbladder without definitive cholelithiasis. No other focal abnormality is seen.   Electronically Signed   By: Inez Catalina M.D.   On: 08/08/2014 08:04    Microbiology: No results found for this or any previous visit (from the past 240 hour(s)).   Labs: Basic Metabolic Panel:  Recent Labs Lab 08/08/14 0554 08/09/14 1012 08/10/14 0555 08/12/14 0610  NA 138 135 139 137  K 4.1 3.5 3.9 3.9  CL 106 102 104 101  CO2 26 26 27 28   GLUCOSE 135* 90 78 90  BUN 20 17 17 19   CREATININE 1.24 1.13 1.07 0.90  CALCIUM 9.0 8.7 9.0 8.9   Liver Function Tests:  Recent Labs Lab 08/08/14 0554 08/09/14 1012 08/10/14 0555  AST 21 18 18  18   ALT 17 13 12  13   ALKPHOS 74 65 66  66  BILITOT 1.3* 2.7* 2.3*  2.6*  PROT 7.4 7.0 7.2  7.2  ALBUMIN 4.1 3.6 3.4*  3.4*    Recent Labs Lab 08/08/14 0554 08/09/14 1012 08/10/14 0555  LIPASE 1165* 74* 31   No results for input(s): AMMONIA in the last 168 hours. CBC:  Recent Labs Lab 08/08/14 0554  WBC 10.5  NEUTROABS 8.1*  HGB 14.6  HCT 43.7  MCV 87.8  PLT 217   Cardiac Enzymes:  Recent Labs Lab 08/08/14 0554 08/08/14 0834  TROPONINI <0.03 <0.03   BNP: BNP (last 3 results) No results for input(s): BNP in the last 8760 hours.  ProBNP (last 3 results) No results for input(s): PROBNP in the last 8760 hours.  CBG: No results for input(s): GLUCAP in the last 168 hours.     SignedRadene Gunning  [facility] 08/14/2014, 1:04 PM

## 2014-08-16 NOTE — Progress Notes (Signed)
UR chart review completed.  

## 2014-08-28 ENCOUNTER — Other Ambulatory Visit: Payer: Self-pay | Admitting: Cardiovascular Disease

## 2014-08-28 NOTE — Telephone Encounter (Signed)
Rx(s) sent to pharmacy electronically. OV 09/23/14

## 2014-09-01 ENCOUNTER — Other Ambulatory Visit: Payer: Self-pay | Admitting: Cardiovascular Disease

## 2014-09-09 ENCOUNTER — Other Ambulatory Visit (HOSPITAL_COMMUNITY): Payer: Self-pay | Admitting: Internal Medicine

## 2014-09-09 ENCOUNTER — Ambulatory Visit (HOSPITAL_COMMUNITY)
Admission: RE | Admit: 2014-09-09 | Discharge: 2014-09-09 | Disposition: A | Discharge status: Home / Self Care | Payer: Medicare Other | Source: Ambulatory Visit | Attending: Internal Medicine | Admitting: Internal Medicine

## 2014-09-09 DIAGNOSIS — R042 Hemoptysis: Secondary | ICD-10-CM | POA: Diagnosis not present

## 2014-09-09 DIAGNOSIS — R0602 Shortness of breath: Secondary | ICD-10-CM | POA: Diagnosis not present

## 2014-09-09 DIAGNOSIS — R0781 Pleurodynia: Secondary | ICD-10-CM

## 2014-09-09 MED ORDER — IOHEXOL 350 MG/ML SOLN
100.0000 mL | Freq: Once | INTRAVENOUS | Status: AC | PRN
  Administered 2014-09-09: 100 mL via INTRAVENOUS

## 2014-09-12 ENCOUNTER — Encounter: Payer: Self-pay | Admitting: *Deleted

## 2014-09-23 ENCOUNTER — Ambulatory Visit (INDEPENDENT_AMBULATORY_CARE_PROVIDER_SITE_OTHER): Payer: Medicare Other | Admitting: Cardiovascular Disease

## 2014-09-23 VITALS — BP 110/70 | HR 67 | Ht 73.0 in | Wt 257.0 lb

## 2014-09-23 DIAGNOSIS — E669 Obesity, unspecified: Secondary | ICD-10-CM | POA: Diagnosis not present

## 2014-09-23 DIAGNOSIS — R600 Localized edema: Secondary | ICD-10-CM | POA: Diagnosis not present

## 2014-09-23 DIAGNOSIS — I2583 Coronary atherosclerosis due to lipid rich plaque: Principal | ICD-10-CM

## 2014-09-23 DIAGNOSIS — E785 Hyperlipidemia, unspecified: Secondary | ICD-10-CM | POA: Diagnosis not present

## 2014-09-23 DIAGNOSIS — I2510 Atherosclerotic heart disease of native coronary artery without angina pectoris: Secondary | ICD-10-CM | POA: Diagnosis not present

## 2014-09-23 NOTE — Patient Instructions (Signed)
Your physician wants you to follow-up in: 1 year or sooner if needed with Dr. Kelly. You will receive a reminder letter in the mail two months in advance. If you don't receive a letter, please call our office to schedule the follow-up appointment. 

## 2014-09-25 ENCOUNTER — Encounter: Payer: Self-pay | Admitting: Cardiovascular Disease

## 2014-09-25 DIAGNOSIS — E785 Hyperlipidemia, unspecified: Secondary | ICD-10-CM | POA: Insufficient documentation

## 2014-09-25 DIAGNOSIS — R600 Localized edema: Secondary | ICD-10-CM | POA: Insufficient documentation

## 2014-09-25 NOTE — Progress Notes (Signed)
Patient ID: Maurice Munoz, male   DOB: April 24, 1946, 69 y.o.   MRN: [id number]     HPI: Maurice Munoz, is a 69 y.o. male who presents to the office today for an 50 month cardiology followup evaluation.     Maurice Munoz has established CAD dating back to 1999 when he underwent intervention to his RCA and PDA. In April 2007 he underwent stenting to his LAD and distal RCA at which time Cypher DES stents were inserted. He has a history of hypertension, obesity, hyperlipidemia, gout, and  lower extremity edema.   A two-year followup nuclear perfusion study in March 2014 was essentially unchanged from his prior study and continued to show fairly normal perfusion and was low risk, without evidence for scar. There was no significant ischemia.  Over the past year, he states he had of the Plavix for 3 months due to injury to his right leg.  He has noted some intermittent leg swelling.  He had carotids Doppler study in January 2016 which showed 50-60% left proximal ICA diameter reduction, which had slightly increased from his prior study one year previously.  Because of a history of coughing up blood with pleuritic chest pain.  He underwent a CT Angiovist chest on March 21 which was negative for PE but showed diffuse peripheral lung densities throughout both upper and lower zones without honeycombing raising the Maurice Munoz concern for mild fibrotic changes but the pattern was nonspecific.  He will be following up with Dr. Luan Munoz in reasonable for further evaluation.  He denies anginal type symptoms.  He denies recent palpitations.  He denies PND, orthopnea.   Past Medical History  Diagnosis Date  . CAD (coronary artery disease) 1999  . DDD (degenerative disc disease)   . HTN (hypertension)   . Obesity   . Hyperlipidemia   . Gout   . Peripheral edema     Past Surgical History  Procedure Laterality Date  . Back surgery      r/t DDD  . Cardiac catheterization      RCA, PDA  . Cardiac  catheterization  april 2007    stenting LAD, distal RCA - Cypher stents  . US echocardiography  08/24/2010    Mild proximal septal thickening    Allergies  Allergen Reactions  . Ibuprofen Itching  . Neosporin [Neomycin-Bacitracin Zn-Polymyx] Swelling  . Penicillins Other (See Comments)    Patient doesn't remember reaction    Current Outpatient Prescriptions  Medication Sig Dispense Refill  . acetaminophen (TYLENOL) 500 MG tablet Take 1,000 mg by mouth 2 (two) times daily as needed for moderate pain.    Marland Kitchen allopurinol (ZYLOPRIM) 300 MG tablet Take 300 mg by mouth daily.     Marland Kitchen aspirin EC 81 MG tablet Take 81 mg by mouth daily.    . clopidogrel (PLAVIX) 75 MG tablet TAKE ONE (1) TABLET EACH DAY 90 tablet 3  . indomethacin (INDOCIN) 50 MG capsule Take 1 capsule (50 mg total) by mouth 2 (two) times daily with a meal. (Patient taking differently: Take 50 mg by mouth as needed. ) 30 capsule 0  . isosorbide mononitrate (IMDUR) 30 MG 24 hr tablet Take 1 tablet (30 mg total) by mouth daily. 90 tablet 1  . lisinopril (PRINIVIL,ZESTRIL) 20 MG tablet TAKE ONE-HALF TABLET DAILY 30 tablet 2  . metoprolol (LOPRESSOR) 50 MG tablet TAKE ONE TABLET BY MOUTH TWICE A DAY 180 tablet 0  . oxyCODONE-acetaminophen (PERCOCET) 10-325 MG per tablet Take 1 tablet by  mouth every 8 (eight) hours as needed for pain.     . pantoprazole (PROTONIX) 40 MG tablet Take 1 tablet (40 mg total) by mouth daily. 15 tablet 0  . predniSONE (DELTASONE) 10 MG tablet Take 4 tabs daily for 6 more days then resume home regimen. 24 tablet o  . simvastatin (ZOCOR) 20 MG tablet TAKE ONE TABLET BY MOUTH EVERY NIGHT AT BEDTIME 30 tablet 2  . tiZANidine (ZANAFLEX) 4 MG tablet Take 4 mg by mouth at bedtime.     . torsemide (DEMADEX) 20 MG tablet Take 1 tablet (20 mg total) by mouth once. (Patient taking differently: Take 20 mg by mouth daily as needed (swelling). ) 30 tablet 6   No current facility-administered medications for this visit.     Socially he is married. There are no children. Has a remote tobacco history having quit approximately 16 years ago. He's not very active. He does not routinely exercise.  ROS General: Negative; No fevers, chills, or night sweats;  HEENT: Negative; No changes in vision or hearing, sinus congestion, difficulty swallowing Pulmonary: Positive for recent pleuritic chest pain Cardiovascular: See history of present illness GI: Negative; No nausea, vomiting, diarrhea, or abdominal pain GU: Negative; No dysuria, hematuria, or difficulty voiding Musculoskeletal: Negative; no myalgias, joint pain, or weakness Hematologic/Oncology: Negative; no easy bruising, bleeding Endocrine: Negative; no heat/cold intolerance; no diabetes Neuro: Negative; no changes in balance, headaches Skin: Negative; No rashes or skin lesions Psychiatric: Negative; No behavioral problems, depression Sleep: Negative; No snoring, daytime sleepiness, hypersomnolence, bruxism, restless legs, hypnogognic hallucinations, no cataplexy Other comprehensive 14 point system review is negative.   PE BP 110/70 mmHg  Pulse 67  Ht 6\' 1"  (1.854 m)  Wt 257 lb (116.574 kg)  BMI 33.91 kg/m2  General: Alert, oriented, no distress.  HEENT: Normocephalic, atraumatic. Pupils round and reactive; sclera anicteric;no lid lag,  Nose without nasal septal hypertrophy Mouth/Parynx benign; Mallinpatti scale 3 Neck: Thick neck ;No JVD, no carotid briuts Lungs: clear to ausculatation and percussion; no wheezing or rales Heart: RRR, s1 s2 normal 1/6 systolic murmur. Abdomen: soft, nontender; no hepatosplenomehaly, BS+; abdominal aorta nontender and not dilated by palpation. Pulses 2+ Extremities:  Bilateral trace edema; no clubbing cyanosis; Homan's sign negative  Neurologic: grossly nonfocal  ECG (independently read by me): Normal sinus rhythm at 67 bpm.  No ectopy.  Normal intervals.  October 2014 ECG: Normal sinus rhythm at 63 beats per  minute. No significant ST abnormalities. Normal intervals.  LABS:  BMET    Component Value Date/Time   NA 137 08/12/2014 0610   K 3.9 08/12/2014 0610   CL 101 08/12/2014 0610   CO2 28 08/12/2014 0610   GLUCOSE 90 08/12/2014 0610   BUN 19 08/12/2014 0610   CREATININE 0.90 08/12/2014 0610   CALCIUM 8.9 08/12/2014 0610   GFRNONAA 87* 08/12/2014 0610   GFRAA >90 08/12/2014 0610     Hepatic Function Panel     Component Value Date/Time   PROT 7.2 08/10/2014 0555   PROT 7.2 08/10/2014 0555   ALBUMIN 3.4* 08/10/2014 0555   ALBUMIN 3.4* 08/10/2014 0555   AST 18 08/10/2014 0555   AST 18 08/10/2014 0555   ALT 13 08/10/2014 0555   ALT 12 08/10/2014 0555   ALKPHOS 66 08/10/2014 0555   ALKPHOS 66 08/10/2014 0555   BILITOT 2.6* 08/10/2014 0555   BILITOT 2.3* 08/10/2014 0555   BILIDIR 0.4 08/10/2014 0555   IBILI 1.9* 08/10/2014 0555     CBC  Component Value Date/Time   WBC 10.5 08/08/2014 0554   RBC 4.98 08/08/2014 0554   HGB 14.6 08/08/2014 0554   HCT 43.7 08/08/2014 0554   PLT 217 08/08/2014 0554   MCV 87.8 08/08/2014 0554   MCH 29.3 08/08/2014 0554   MCHC 33.4 08/08/2014 0554   RDW 14.1 08/08/2014 0554   LYMPHSABS 1.5 08/08/2014 0554   MONOABS 0.8 08/08/2014 0554   EOSABS 0.1 08/08/2014 0554   BASOSABS 0.0 08/08/2014 0554     BNP    Component Value Date/Time   PROBNP 34.0 09/14/2009 1427    Lipid Panel     Component Value Date/Time   CHOL 133 08/10/2014 0555   TRIG 122 08/10/2014 0555   HDL 32* 08/10/2014 0555   CHOLHDL 4.2 08/10/2014 0555   VLDL 24 08/10/2014 0555   LDLCALC 77 08/10/2014 0555     RADIOLOGY: No results found.    ASSESSMENT AND PLAN: Maurice Munoz is 17 years since his initial intervention to his RCA and PDA and 9 years since stenting to his LAD and distal RCA.  He has a history of hypertension.  His blood pressure today is well controlled on his regimen consisting of lisinopril 10 mg, metoprolol 50 twice a day in addition to his  torsemide 20 mg and isosorbide mononitrate.  He's not having any anginal symptomatology on the above therapy.  He is on simvastatin 20 mg for hyperlipidemia.  LDL in February 2016 was 77.  He does have a low HDL level.  I reviewed his recent CT angiographic test.  This was negative for PE.  He does have fibrotic changes and will be seeing Dr. Luan Munoz for follow-up.  He continues to be on aspirin and Plavix for dual antiplatelet therapy for CAD.  He has mild obesity with a body this index of 33.9, and weight loss and increased activity was recommended with at least 5 days per week of 30 minutes of exercise.  I will see him in one year for reevaluation or sooner if necessary.  Troy Sine, MD, [facility]  09/25/2014 7:14 PM

## 2014-10-07 ENCOUNTER — Other Ambulatory Visit (HOSPITAL_COMMUNITY): Payer: Self-pay | Admitting: Radiology

## 2014-10-07 DIAGNOSIS — R9389 Abnormal findings on diagnostic imaging of other specified body structures: Secondary | ICD-10-CM

## 2014-10-14 ENCOUNTER — Other Ambulatory Visit: Payer: Self-pay

## 2014-10-14 ENCOUNTER — Ambulatory Visit (INDEPENDENT_AMBULATORY_CARE_PROVIDER_SITE_OTHER): Payer: Medicare Other | Admitting: Gastroenterology

## 2014-10-14 ENCOUNTER — Encounter: Payer: Self-pay | Admitting: Gastroenterology

## 2014-10-14 VITALS — BP 121/69 | HR 70 | Temp 97.4°F | Ht 71.0 in | Wt 263.2 lb

## 2014-10-14 DIAGNOSIS — K858 Other acute pancreatitis without necrosis or infection: Secondary | ICD-10-CM

## 2014-10-14 DIAGNOSIS — K859 Acute pancreatitis, unspecified: Secondary | ICD-10-CM

## 2014-10-14 NOTE — Patient Instructions (Signed)
We have scheduled you for an MRI to further evaluate your pancreas.  I will see when your next colonoscopy is due. Further recommendations to follow!

## 2014-10-14 NOTE — Progress Notes (Signed)
Referring Provider: Elsie Lincoln, MD Primary Care Physician:  Leonides Grills, MD  Primary GI: Dr. Oneida Alar   Chief Complaint  Patient presents with  . Follow-up    from hospital. doing well    HPI:   REGINALDO HAZARD presents today in hospital follow-up for acute, uncomplicated pancreatitis. Idiopathic. Needs MRI/MRCP for further evaluation. Denies abdominal pain. No N/V. No reflux symptoms. No dysphagia. Good appetite. Issues with hemoptysis, saw Dr. Luan Pulling. Evaluation underway.   Last colonoscopy by Dr. Laural Golden. Requesting records.    Past Medical History  Diagnosis Date  . CAD (coronary artery disease) 1999  . DDD (degenerative disc disease)   . HTN (hypertension)   . Obesity   . Hyperlipidemia   . Gout   . Peripheral edema     Past Surgical History  Procedure Laterality Date  . Back surgery      r/t DDD  . Cardiac catheterization      RCA, PDA  . Cardiac catheterization  april 2007    stenting LAD, distal RCA - Cypher stents  . US echocardiography  08/24/2010    Mild proximal septal thickening    Current Outpatient Prescriptions  Medication Sig Dispense Refill  . acetaminophen (TYLENOL) 500 MG tablet Take 1,000 mg by mouth 2 (two) times daily as needed for moderate pain.    Marland Kitchen allopurinol (ZYLOPRIM) 300 MG tablet Take 300 mg by mouth daily.     Marland Kitchen aspirin EC 81 MG tablet Take 81 mg by mouth daily.    . clopidogrel (PLAVIX) 75 MG tablet TAKE ONE (1) TABLET EACH DAY 90 tablet 3  . isosorbide mononitrate (IMDUR) 30 MG 24 hr tablet Take 1 tablet (30 mg total) by mouth daily. 90 tablet 1  . lisinopril (PRINIVIL,ZESTRIL) 20 MG tablet TAKE ONE-HALF TABLET DAILY 30 tablet 2  . metoprolol (LOPRESSOR) 50 MG tablet TAKE ONE TABLET BY MOUTH TWICE A DAY 180 tablet 0  . oxyCODONE-acetaminophen (PERCOCET) 10-325 MG per tablet Take 1 tablet by mouth every 8 (eight) hours as needed for pain.     . predniSONE (DELTASONE) 10 MG tablet Take 4 tabs daily for 6 more days  then resume home regimen. (Patient taking differently: Take 4 tabs daily for 6 more days then resume home regimen.prn) 24 tablet o  . simvastatin (ZOCOR) 20 MG tablet TAKE ONE TABLET BY MOUTH EVERY NIGHT AT BEDTIME 30 tablet 2  . tiZANidine (ZANAFLEX) 4 MG tablet Take 4 mg by mouth at bedtime.     . torsemide (DEMADEX) 20 MG tablet Take 1 tablet (20 mg total) by mouth once. (Patient taking differently: Take 20 mg by mouth daily as needed (swelling). ) 30 tablet 6   No current facility-administered medications for this visit.    Allergies as of 10/14/2014 - Review Complete 10/14/2014  Allergen Reaction Noted  . Ibuprofen Itching   . Neosporin [neomycin-bacitracin zn-polymyx] Swelling 05/11/2014  . Penicillins Other (See Comments)     Family History  Problem Relation Age of Onset  . Heart attack Brother   . Diabetes Brother   . Cancer Father   . Colon cancer Neg Hx     History   Social History  . Marital Status: Married    Spouse Name: N/A  . Number of Children: N/A  . Years of Education: N/A   Social History Main Topics  . Smoking status: Former Smoker    Types: Cigarettes    Quit date: 11/27/1988  . Smokeless tobacco: Not  on file  . Alcohol Use: No  . Drug Use: No  . Sexual Activity: Not on file   Other Topics Concern  . None   Social History Narrative   Previously used used to work on a farm   Currently retired secondary to chronic back pains-   Used to be a Art therapist   Married to his wife and lives in [place]   Father died at age 71 lung cancer   Mother had CAD   Wife had cancer    Review of Systems: Gen: Denies fever, chills, anorexia. Denies fatigue, weakness, weight loss.  CV: Denies chest pain, palpitations, syncope, peripheral edema, and claudication. Resp: +cough, hemoptysis, PCP aware GI: see HPI Derm: Denies rash, itching, dry skin Psych: Denies depression, anxiety, memory loss, confusion. No homicidal or suicidal  ideation.  Heme: Denies bruising, bleeding, and enlarged lymph nodes.  Physical Exam: BP 121/69 mmHg  Pulse 70  Temp(Src) 97.4 F (36.3 C) (Oral)  Ht 5\' 11"  (1.803 m)  Wt 263 lb 3.2 oz (119.387 kg)  BMI 36.73 kg/m2 General:   Alert and oriented. No distress noted. Pleasant and cooperative.  Head:  Normocephalic and atraumatic. Eyes:  Conjuctiva clear without scleral icterus. Mouth:  Oral mucosa pink and moist. Good dentition. No lesions. Heart:  S1, S2 present without murmurs Abdomen:  +BS, soft, large AP diameter, difficult to appreciate HSM Msk:  Symmetrical without gross deformities. Normal posture. Extremities:  1-2 + LEE  Neurologic:  Alert and  oriented x4;  grossly normal neurologically. Skin:  Intact without significant lesions or rashes. Psych:  Alert and cooperative. Normal mood and affect.

## 2014-10-15 NOTE — Progress Notes (Signed)
PA# [id number] FOR MRCP-SCHEDULED ON 10/22/14 @ 9:45 AM

## 2014-10-16 ENCOUNTER — Ambulatory Visit (HOSPITAL_COMMUNITY)
Admission: RE | Admit: 2014-10-16 | Discharge: 2014-10-16 | Disposition: A | Discharge status: Home / Self Care | Payer: Medicare Other | Source: Ambulatory Visit | Attending: Pulmonary Disease | Admitting: Pulmonary Disease

## 2014-10-16 DIAGNOSIS — R938 Abnormal findings on diagnostic imaging of other specified body structures: Secondary | ICD-10-CM | POA: Insufficient documentation

## 2014-10-16 MED ORDER — ALBUTEROL SULFATE (2.5 MG/3ML) 0.083% IN NEBU
2.5000 mg | INHALATION_SOLUTION | Freq: Once | RESPIRATORY_TRACT | Status: AC
  Administered 2014-10-16: 2.5 mg via RESPIRATORY_TRACT

## 2014-10-16 NOTE — Progress Notes (Signed)
cc'ed to pcp °

## 2014-10-16 NOTE — Assessment & Plan Note (Signed)
69 year old male returning in follow-up after hospitalization for acute, uncomplicated pancreatitis. Doing quite well from a GI standpoint without any complaints. Need to set up MRI/MRCP of pancreas to evaluate for any underlying etiology. Last colonoscopy in remote past; will need to obtain those records as well to determine timing of next procedure.

## 2014-10-19 LAB — PULMONARY FUNCTION TEST
DL/VA % pred: 83 %
DL/VA: 3.95 ml/min/mmHg/L
DLCO UNC % PRED: 45 %
DLCO unc: 15.96 ml/min/mmHg
FEF 25-75 PRE: 3.44 L/s
FEF 25-75 Post: 3.97 L/sec
FEF2575-%Change-Post: 15 %
FEF2575-%Pred-Post: 140 %
FEF2575-%Pred-Pre: 121 %
FEV1-%CHANGE-POST: 2 %
FEV1-%PRED-POST: 70 %
FEV1-%Pred-Pre: 68 %
FEV1-Post: 2.57 L
FEV1-Pre: 2.5 L
FEV1FVC-%Change-Post: 3 %
FEV1FVC-%Pred-Pre: 116 %
FEV6-%Change-Post: 0 %
FEV6-%PRED-PRE: 62 %
FEV6-%Pred-Post: 62 %
FEV6-PRE: 2.91 L
FEV6-Post: 2.9 L
FEV6FVC-%Pred-Post: 105 %
FEV6FVC-%Pred-Pre: 105 %
FVC-%CHANGE-POST: 0 %
FVC-%PRED-PRE: 59 %
FVC-%Pred-Post: 59 %
FVC-PRE: 2.91 L
FVC-Post: 2.9 L
Post FEV1/FVC ratio: 89 %
Post FEV6/FVC ratio: 100 %
Pre FEV1/FVC ratio: 86 %
Pre FEV6/FVC Ratio: 100 %
RV % pred: 73 %
RV: 1.83 L
TLC % PRED: 63 %
TLC: 4.72 L

## 2014-10-22 ENCOUNTER — Ambulatory Visit (HOSPITAL_COMMUNITY)
Admission: RE | Admit: 2014-10-22 | Discharge: 2014-10-22 | Disposition: A | Discharge status: Home / Self Care | Payer: Medicare Other | Source: Ambulatory Visit | Attending: Gastroenterology | Admitting: Gastroenterology

## 2014-10-22 ENCOUNTER — Other Ambulatory Visit: Payer: Self-pay | Admitting: Gastroenterology

## 2014-10-22 DIAGNOSIS — R1010 Upper abdominal pain, unspecified: Secondary | ICD-10-CM | POA: Diagnosis present

## 2014-10-22 DIAGNOSIS — K859 Acute pancreatitis, unspecified: Secondary | ICD-10-CM

## 2014-10-22 DIAGNOSIS — N289 Disorder of kidney and ureter, unspecified: Secondary | ICD-10-CM | POA: Insufficient documentation

## 2014-10-22 LAB — POCT I-STAT CREATININE: CREATININE: 1.1 mg/dL (ref 0.61–1.24)

## 2014-10-22 MED ORDER — GADOBENATE DIMEGLUMINE 529 MG/ML IV SOLN
20.0000 mL | Freq: Once | INTRAVENOUS | Status: AC | PRN
  Administered 2014-10-22: 20 mL via INTRAVENOUS

## 2014-10-22 NOTE — Progress Notes (Signed)
Can we see if last colonoscopy reports available? Dr. Laural Golden either at [facility] or [facility]. I believe he signed a release at his appt 4/25.

## 2014-10-23 NOTE — Progress Notes (Signed)
I sent request to [facility] on 4/25 and again today to [facility] and [facility]. Wannetta Sender is to be faxing me what she found on patient

## 2014-10-29 ENCOUNTER — Other Ambulatory Visit: Payer: Self-pay | Admitting: Cardiovascular Disease

## 2014-10-29 NOTE — Telephone Encounter (Signed)
Rx(s) sent to pharmacy electronically.  

## 2014-11-04 ENCOUNTER — Ambulatory Visit (INDEPENDENT_AMBULATORY_CARE_PROVIDER_SITE_OTHER): Payer: Medicare Other | Admitting: Orthopedic Surgery

## 2014-11-04 ENCOUNTER — Encounter: Payer: Self-pay | Admitting: Orthopedic Surgery

## 2014-11-04 VITALS — BP 116/63 | Ht 71.0 in | Wt 263.0 lb

## 2014-11-04 DIAGNOSIS — M7551 Bursitis of right shoulder: Secondary | ICD-10-CM | POA: Diagnosis not present

## 2014-11-04 NOTE — Progress Notes (Signed)
Quick Note:  MRI/MRCP without evidence of occult pancreatic issues. HE does have multiple renal lesions that are likely cysts. However, let's copy this to his PCP for any further evaluation that may be needed. ______

## 2014-11-04 NOTE — Progress Notes (Signed)
Chief Complaint  Patient presents with  . Shoulder Pain    Left shoulder pain, no injury.    Mr. Anthes comes in complaining of left shoulder pain. He had a fall several years ago and was worked up for and had surgery for cervical spine injury. Over the last month to 3 months he's noticed increased pain in his right shoulder. He can't sleep on his right side. He has pain with trying to lift his arm over his head or any activities where he is holding something in his hand away from his body. The pain is severe and is taking pain pills without relief and is making a lot of noise  System review he has back pain weakness unsteady gait is had a lumbar fusion his legs are very weak his balance issues he had recently had some GI bleeding  His past history is in Epic and has been reviewed Past Medical History  Diagnosis Date  . CAD (coronary artery disease) 1999  . DDD (degenerative disc disease)   . HTN (hypertension)   . Obesity   . Hyperlipidemia   . Gout   . Peripheral edema     Vital signs BP 116/63 mmHg  Ht 5\' 11"  (1.803 m)  Wt 263 lb (119.296 kg)  BMI 36.70 kg/m2 Appearance normal moderate obesity oriented 3 mood is pleasant he walks with a cane he has a flexed lumbopelvic area flexed knee pattern consistent with long-standing spinal stenosis type look  His left shoulder makes significant crepitance on range of motion passive range of motion is 150 he has painful [place] elevation positive impingement sign and no instability his supraspinatus strength is grade 4 out of 5 his external rotation with his arm at his side is 45 his scans intact his pulses are good is lymph nodes are negative and his sensation is normal  No radiographic taken on this visit.  I did give him a subacromial injection  If he's not better in a week is to come back for formal x-rays and eventual MRI to image rotator cuff  Procedure note the subacromial injection shoulder left   Verbal consent was obtained to  inject the  Left   Shoulder  Timeout was completed to confirm the injection site is a subacromial space of the  left  shoulder  Medication used Depo-Medrol 40 mg and lidocaine 1% 3 cc  Anesthesia was provided by ethyl chloride  The injection was performed in the left  posterior subacromial space. After pinning the skin with alcohol and anesthetized the skin with ethyl chloride the subacromial space was injected using a 20-gauge needle. There were no complications  Sterile dressing was applied.

## 2014-11-04 NOTE — Patient Instructions (Signed)
Joint Injection  Care After  Refer to this sheet in the next few days. These instructions provide you with information on caring for yourself after you have had a joint injection. Your caregiver also may give you more specific instructions. Your treatment has been planned according to current medical practices, but problems sometimes occur. Call your caregiver if you have any problems or questions after your procedure.  After any type of joint injection, it is not uncommon to experience:  · Soreness, swelling, or bruising around the injection site.  · Mild numbness, tingling, or weakness around the injection site caused by the numbing medicine used before or with the injection.  It also is possible to experience the following effects associated with the specific agent after injection:  · Iodine-based contrast agents:  ¨ Allergic reaction (itching, hives, widespread redness, and swelling beyond the injection site).  · Corticosteroids (These effects are rare.):  ¨ Allergic reaction.  ¨ Increased blood sugar levels (If you have diabetes and you notice that your blood sugar levels have increased, notify your caregiver).  ¨ Increased blood pressure levels.  ¨ Mood swings.  · Hyaluronic acid in the use of viscosupplementation.  ¨ Temporary heat or redness.  ¨ Temporary rash and itching.  ¨ Increased fluid accumulation in the injected joint.  These effects all should resolve within a day after your procedure.   HOME CARE INSTRUCTIONS  · Limit yourself to light activity the day of your procedure. Avoid lifting heavy objects, bending, stooping, or twisting.  · Take prescription or over-the-counter pain medication as directed by your caregiver.  · You may apply ice to your injection site to reduce pain and swelling the day of your procedure. Ice may be applied 03-04 times:  ¨ Put ice in a plastic bag.  ¨ Place a towel between your skin and the bag.  ¨ Leave the ice on for no longer than 15-20 minutes each time.  SEEK  IMMEDIATE MEDICAL CARE IF:   · Pain and swelling get worse rather than better or extend beyond the injection site.  · Numbness does not go away.  · Blood or fluid continues to leak from the injection site.  · You have chest pain.  · You have swelling of your face or tongue.  · You have trouble breathing or you become dizzy.  · You develop a fever, chills, or severe tenderness at the injection site that last longer than 1 day.  MAKE SURE YOU:  · Understand these instructions.  · Watch your condition.  · Get help right away if you are not doing well or if you get worse.  Document Released: 02/18/2011 Document Revised: 08/30/2011 Document Reviewed: 02/18/2011  Exit[employer]® Patient Information ©2015 [employer]. This information is not intended to replace advice given to you by your health care provider. Make sure you discuss any questions you have with your health care provider.

## 2014-11-05 NOTE — Progress Notes (Signed)
Quick Note:  To Maurice Munoz ______

## 2014-11-05 NOTE — Progress Notes (Signed)
Quick Note:  LMOM to call. ______ 

## 2014-11-05 NOTE — Progress Notes (Signed)
Quick Note:  Pt is aware of results. Routing to [place] to send copy to PCP. ______

## 2014-11-29 ENCOUNTER — Other Ambulatory Visit: Payer: Self-pay | Admitting: Cardiovascular Disease

## 2014-11-29 ENCOUNTER — Other Ambulatory Visit (HOSPITAL_COMMUNITY): Payer: Self-pay | Admitting: Cardiovascular Disease

## 2014-11-29 NOTE — Telephone Encounter (Signed)
Rx(s) sent to pharmacy electronically.  

## 2014-12-16 ENCOUNTER — Other Ambulatory Visit: Payer: Self-pay

## 2015-01-31 ENCOUNTER — Other Ambulatory Visit (HOSPITAL_COMMUNITY): Payer: Self-pay | Admitting: Pulmonary Disease

## 2015-01-31 DIAGNOSIS — R9389 Abnormal findings on diagnostic imaging of other specified body structures: Secondary | ICD-10-CM

## 2015-01-31 DIAGNOSIS — R0602 Shortness of breath: Secondary | ICD-10-CM

## 2015-02-05 ENCOUNTER — Ambulatory Visit (HOSPITAL_COMMUNITY)
Admission: RE | Admit: 2015-02-05 | Discharge: 2015-02-05 | Disposition: A | Discharge status: Home / Self Care | Payer: Medicare Other | Source: Ambulatory Visit | Attending: Pulmonary Disease | Admitting: Pulmonary Disease

## 2015-02-05 ENCOUNTER — Other Ambulatory Visit (HOSPITAL_COMMUNITY): Payer: Self-pay | Admitting: Pulmonary Disease

## 2015-02-05 DIAGNOSIS — R0602 Shortness of breath: Secondary | ICD-10-CM

## 2015-02-05 DIAGNOSIS — R918 Other nonspecific abnormal finding of lung field: Secondary | ICD-10-CM | POA: Diagnosis not present

## 2015-02-05 DIAGNOSIS — R9389 Abnormal findings on diagnostic imaging of other specified body structures: Secondary | ICD-10-CM

## 2015-02-05 DIAGNOSIS — I2510 Atherosclerotic heart disease of native coronary artery without angina pectoris: Secondary | ICD-10-CM | POA: Diagnosis not present

## 2015-02-05 DIAGNOSIS — K760 Fatty (change of) liver, not elsewhere classified: Secondary | ICD-10-CM | POA: Diagnosis not present

## 2015-02-05 DIAGNOSIS — D3502 Benign neoplasm of left adrenal gland: Secondary | ICD-10-CM | POA: Diagnosis not present

## 2015-06-06 ENCOUNTER — Other Ambulatory Visit (HOSPITAL_COMMUNITY): Payer: Self-pay | Admitting: Cardiovascular Disease

## 2015-07-03 ENCOUNTER — Other Ambulatory Visit (HOSPITAL_COMMUNITY): Payer: Self-pay | Admitting: Internal Medicine

## 2015-07-03 ENCOUNTER — Other Ambulatory Visit: Payer: Self-pay | Admitting: Cardiovascular Disease

## 2015-07-03 DIAGNOSIS — I6523 Occlusion and stenosis of bilateral carotid arteries: Secondary | ICD-10-CM

## 2015-07-18 ENCOUNTER — Ambulatory Visit (HOSPITAL_COMMUNITY)
Admission: RE | Admit: 2015-07-18 | Discharge: 2015-07-18 | Disposition: A | Discharge status: Home / Self Care | Payer: Medicare Other | Source: Ambulatory Visit | Attending: Urology | Admitting: Urology

## 2015-07-18 DIAGNOSIS — I10 Essential (primary) hypertension: Secondary | ICD-10-CM | POA: Diagnosis not present

## 2015-07-18 DIAGNOSIS — E785 Hyperlipidemia, unspecified: Secondary | ICD-10-CM | POA: Insufficient documentation

## 2015-07-18 DIAGNOSIS — I6523 Occlusion and stenosis of bilateral carotid arteries: Secondary | ICD-10-CM | POA: Diagnosis not present

## 2015-08-08 ENCOUNTER — Telehealth: Payer: Self-pay | Admitting: *Deleted

## 2015-08-08 DIAGNOSIS — I6523 Occlusion and stenosis of bilateral carotid arteries: Secondary | ICD-10-CM

## 2015-08-08 NOTE — Telephone Encounter (Signed)
-----   Message from Troy Sine, MD sent at 08/08/2015  2:26 PM EST ----- Heterogeneous plaque, bilaterally.  Stable RICA velocities, now in 1-39% range.  Stable LICA velocities, now in 60-79% range. Normal subclavian arteries, bilaterally. Patent vertebral arteries with antegrade flow. F/U 1 year

## 2015-09-12 ENCOUNTER — Emergency Department (HOSPITAL_COMMUNITY)
Admission: EM | Admit: 2015-09-12 | Discharge: 2015-09-12 | Disposition: A | Discharge status: Home / Self Care | Payer: Medicare Other | Attending: Emergency Medicine | Admitting: Emergency Medicine

## 2015-09-12 ENCOUNTER — Encounter (HOSPITAL_COMMUNITY): Payer: Self-pay | Admitting: Emergency Medicine

## 2015-09-12 DIAGNOSIS — I2510 Atherosclerotic heart disease of native coronary artery without angina pectoris: Secondary | ICD-10-CM | POA: Insufficient documentation

## 2015-09-12 DIAGNOSIS — N289 Disorder of kidney and ureter, unspecified: Secondary | ICD-10-CM | POA: Diagnosis not present

## 2015-09-12 DIAGNOSIS — Z87891 Personal history of nicotine dependence: Secondary | ICD-10-CM | POA: Insufficient documentation

## 2015-09-12 DIAGNOSIS — I10 Essential (primary) hypertension: Secondary | ICD-10-CM | POA: Insufficient documentation

## 2015-09-12 DIAGNOSIS — R55 Syncope and collapse: Secondary | ICD-10-CM

## 2015-09-12 DIAGNOSIS — Z79899 Other long term (current) drug therapy: Secondary | ICD-10-CM | POA: Diagnosis not present

## 2015-09-12 DIAGNOSIS — L03031 Cellulitis of right toe: Secondary | ICD-10-CM | POA: Insufficient documentation

## 2015-09-12 DIAGNOSIS — Z7982 Long term (current) use of aspirin: Secondary | ICD-10-CM | POA: Diagnosis not present

## 2015-09-12 DIAGNOSIS — I959 Hypotension, unspecified: Secondary | ICD-10-CM

## 2015-09-12 DIAGNOSIS — E785 Hyperlipidemia, unspecified: Secondary | ICD-10-CM | POA: Diagnosis not present

## 2015-09-12 DIAGNOSIS — E669 Obesity, unspecified: Secondary | ICD-10-CM | POA: Diagnosis not present

## 2015-09-12 LAB — BASIC METABOLIC PANEL
ANION GAP: 7 (ref 5–15)
BUN: 32 mg/dL — ABNORMAL HIGH (ref 6–20)
CHLORIDE: 104 mmol/L (ref 101–111)
CO2: 29 mmol/L (ref 22–32)
Calcium: 9.1 mg/dL (ref 8.9–10.3)
Creatinine, Ser: 1.4 mg/dL — ABNORMAL HIGH (ref 0.61–1.24)
GFR calc non Af Amer: 50 mL/min — ABNORMAL LOW (ref >60)
GFR, EST AFRICAN AMERICAN: 59 mL/min — AB (ref >60)
Glucose, Bld: 108 mg/dL — ABNORMAL HIGH (ref 65–99)
POTASSIUM: 4.5 mmol/L (ref 3.5–5.1)
Sodium: 140 mmol/L (ref 135–145)

## 2015-09-12 LAB — CBC
HEMATOCRIT: 44.9 % (ref 39.0–52.0)
HEMOGLOBIN: 14.9 g/dL (ref 13.0–17.0)
MCH: 28.4 pg (ref 26.0–34.0)
MCHC: 33.2 g/dL (ref 30.0–36.0)
MCV: 85.7 fL (ref 78.0–100.0)
PLATELETS: 199 10*3/uL (ref 150–400)
RBC: 5.24 MIL/uL (ref 4.22–5.81)
RDW: 13.6 % (ref 11.5–15.5)
WBC: 8.4 10*3/uL (ref 4.0–10.5)

## 2015-09-12 MED ORDER — SULFAMETHOXAZOLE-TRIMETHOPRIM 800-160 MG PO TABS: 1.0000 | ORAL_TABLET | Freq: Two times a day (BID) | ORAL | Status: AC

## 2015-09-12 NOTE — Discharge Instructions (Signed)
Stop the lisinopril altogether. Continue taking your other blood pressure medicine.  Your creatinine was slightly elevated today. This is a measure of kidney function. There is a strong possibility that this could be related to the lisinopril.  [place]aki foot in warm salt water. Antibiotic. Follow-up your primary care doctor.

## 2015-09-12 NOTE — ED Provider Notes (Signed)
CSN: [id number]     Arrival date & time 09/12/15  1458 History   First MD Initiated Contact with Patient 09/12/15 1617     Chief Complaint  Patient presents with  . Near Syncope     (Consider location/radiation/quality/duration/timing/severity/associated sxs/prior Treatment) HPI.... Patient felt lightheaded today and almost passed out. His vision was slightly blurry at the time. Glucose was 150. Blood pressure was 73/50 at that time. Patient has lost 35 pounds in the past 3 months and has not had his blood pressure medication regulated. He currently takes metoprolol 50 mg twice a day and lisinopril 10 mg every other day. No chest pain, dyspnea, neurodeficits, fever, sweats, chills. Review systems is positive for inflamed right baby toe  Past Medical History  Diagnosis Date  . CAD (coronary artery disease) 1999  . DDD (degenerative disc disease)   . HTN (hypertension)   . Obesity   . Hyperlipidemia   . Gout   . Peripheral edema    Past Surgical History  Procedure Laterality Date  . Back surgery      r/t DDD  . Cardiac catheterization      RCA, PDA  . Cardiac catheterization  april 2007    stenting LAD, distal RCA - Cypher stents  . US echocardiography  08/24/2010    Mild proximal septal thickening   Family History  Problem Relation Age of Onset  . Heart attack Brother   . Diabetes Brother   . Cancer Father   . Colon cancer Neg Hx    Social History  Substance Use Topics  . Smoking status: Former Smoker    Types: Cigarettes    Quit date: 11/27/1988  . Smokeless tobacco: None  . Alcohol Use: No    Review of Systems  All other systems reviewed and are negative.     Allergies  Ibuprofen; Neosporin; and Penicillins  Home Medications   Prior to Admission medications   Medication Sig Start Date End Date Taking? Authorizing Provider  acetaminophen (TYLENOL) 500 MG tablet Take 1,000 mg by mouth 2 (two) times daily as needed for moderate pain.    Historical Provider,  MD  allopurinol (ZYLOPRIM) 300 MG tablet Take 300 mg by mouth daily.  11/06/12   Historical Provider, MD  aspirin EC 81 MG tablet Take 81 mg by mouth daily.    Historical Provider, MD  clopidogrel (PLAVIX) 75 MG tablet Take 1 tablet (75 mg total) by mouth daily. 11/29/14   Troy Sine, MD  isosorbide mononitrate (IMDUR) 30 MG 24 hr tablet Take 1 tablet (30 mg total) by mouth daily. 12/10/13   Troy Sine, MD  lisinopril (PRINIVIL,ZESTRIL) 20 MG tablet TAKE ONE-HALF TABLET DAILY 06/06/15   Troy Sine, MD  metoprolol (LOPRESSOR) 50 MG tablet Take 1 tablet (50 mg total) by mouth 2 (two) times daily. 11/29/14   Troy Sine, MD  oxyCODONE-acetaminophen (PERCOCET) 10-325 MG per tablet Take 1 tablet by mouth every 8 (eight) hours as needed for pain.  10/11/12   Historical Provider, MD  predniSONE (DELTASONE) 10 MG tablet Take 4 tabs daily for 6 more days then resume home regimen. Patient taking differently: Take 4 tabs daily for 6 more days then resume home regimen.prn 08/14/14   Radene Gunning, NP  simvastatin (ZOCOR) 20 MG tablet Take 1 tablet (20 mg total) by mouth at bedtime. 10/29/14   Troy Sine, MD  sulfamethoxazole-trimethoprim (BACTRIM DS,SEPTRA DS) 800-160 MG tablet Take 1 tablet by mouth 2 (two) times  daily. 09/12/15 09/19/15  Nat Christen, MD  tiZANidine (ZANAFLEX) 4 MG tablet Take 4 mg by mouth at bedtime.  11/08/12   Historical Provider, MD  torsemide (DEMADEX) 20 MG tablet TAKE ONE (1) TABLET BY MOUTH EVERY DAY 11/29/14   Troy Sine, MD   BP 100/43 mmHg  Pulse 58  Temp(Src) 97.7 F (36.5 C) (Oral)  Resp 20  Ht 6\' 1"  (1.854 m)  Wt 244 lb (110.678 kg)  BMI 32.20 kg/m2  SpO2 95% Physical Exam  Constitutional: He is oriented to person, place, and time.  Well-appearing, hypotensive, alert  HENT:  Head: Normocephalic and atraumatic.  Eyes: Conjunctivae and EOM are normal. Pupils are equal, round, and reactive to light.  Neck: Normal range of motion. Neck supple.   Cardiovascular: Normal rate and regular rhythm.   Pulmonary/Chest: Effort normal and breath sounds normal.  Abdominal: Soft. Bowel sounds are normal.  Musculoskeletal: Normal range of motion.  Neurological: He is alert and oriented to person, place, and time.  Skin:  Right fifth toe: Minor paronychia noted  Psychiatric: He has a normal mood and affect. His behavior is normal.  Nursing note and vitals reviewed.   ED Course  Procedures (including critical care time) Labs Review Labs Reviewed  BASIC METABOLIC PANEL - Abnormal; Notable for the following:    Glucose, Bld 108 (*)    BUN 32 (*)    Creatinine, Ser 1.40 (*)    GFR calc non Af Amer 50 (*)    GFR calc Af Amer 59 (*)    All other components within normal limits  CBC    Imaging Review No results found. I have personally reviewed and evaluated these images and lab results as part of my medical decision-making.   EKG Interpretation   Date/Time:  Friday September 12 2015 15:14:52 EDT Ventricular Rate:  61 PR Interval:  148 QRS Duration: 90 QT Interval:  412 QTC Calculation: 414 R Axis:   66 Text Interpretation:  Normal sinus rhythm Normal ECG Confirmed by Doriana Mazurkiewicz   MD, Dura Mccormack ([id number]) on 09/12/2015 6:38:45 PM      MDM   Final diagnoses:  Near syncope  Hypotension, unspecified hypotension type  Renal insufficiency  Paronychia of fifth toe, right    Patient is alert and oriented 3. He does not appear impaired. Recommend discontinuing his lisinopril. We talked about his creatinine elevation. Continue the metoprolol 50 mg twice a day for now. Also Rx for Septra DS twice a day for toe.  Discussed with the patient and his wife.    Nat Christen, MD 09/12/15 [phone]

## 2015-09-12 NOTE — ED Notes (Signed)
PT stated he has had nausea x2 days and he was eating this am and starting having dizziness and felt like he was going to pass out but no LOC. PT stated he went to his brother's house and his CBG was 150 and his b/p was 82/49 at home prior to ED arrival. PT stated that he has a blistered red area to right foot small toe x3 days as well with pain. PT denies any dizziness or weakness at this time.

## 2015-11-21 ENCOUNTER — Encounter: Payer: Self-pay | Admitting: Cardiovascular Disease

## 2015-11-21 ENCOUNTER — Ambulatory Visit (INDEPENDENT_AMBULATORY_CARE_PROVIDER_SITE_OTHER): Payer: Medicare Other | Admitting: Cardiovascular Disease

## 2015-11-21 VITALS — BP 130/73 | HR 61 | Ht 73.0 in | Wt 242.8 lb

## 2015-11-21 DIAGNOSIS — I2510 Atherosclerotic heart disease of native coronary artery without angina pectoris: Secondary | ICD-10-CM | POA: Diagnosis not present

## 2015-11-21 DIAGNOSIS — E785 Hyperlipidemia, unspecified: Secondary | ICD-10-CM | POA: Diagnosis not present

## 2015-11-21 DIAGNOSIS — Z79899 Other long term (current) drug therapy: Secondary | ICD-10-CM

## 2015-11-21 DIAGNOSIS — I2583 Coronary atherosclerosis due to lipid rich plaque: Secondary | ICD-10-CM

## 2015-11-21 NOTE — Patient Instructions (Signed)
Medication Instructions: Dr Claiborne Billings recommends that you continue on your current medications as directed. Please refer to the Current Medication list given to you today.  Labwork: Your physician recommends that you return for lab work at your convenience - FASTING.  Testing/Procedures: NONE ORDERED  Follow-up: Dr Claiborne Billings recommends that you schedule a follow-up appointment in 6 months. You will receive a reminder letter in the mail two months in advance. If you don't receive a letter, please call our office to schedule the follow-up appointment.  If you need a refill on your cardiac medications before your next appointment, please call your pharmacy.

## 2015-11-23 ENCOUNTER — Encounter: Payer: Self-pay | Admitting: Cardiovascular Disease

## 2015-11-23 NOTE — Progress Notes (Signed)
Patient ID: Maurice Munoz, male   DOB: 1945/10/17, 70 y.o.   MRN: [id number]      HPI: Maurice Munoz, is a 70 y.o. male who presents to the office today for an 64 month cardiology followup evaluation.     [name] has established CAD dating back to 1999 when he underwent intervention to his RCA and PDA. In April 2007 he underwent stenting to his LAD and distal RCA at which time Cypher DES stents were inserted. He has a history of hypertension, obesity, hyperlipidemia, gout, and  lower extremity edema.   A two-year followup nuclear perfusion study in March 2014 was essentially unchanged from his prior study and continued to show fairly normal perfusion and was low risk, without evidence for scar. There was no significant ischemia.  In 2015 he had of the Plavix for 3 months due to injury to his right leg.  He has noted some intermittent leg swelling.  He had carotids Doppler study in January 2016 which showed 50-60% left proximal ICA diameter reduction, which had slightly increased from his prior study one year previously.  Because of a history of coughing up blood with pleuritic chest pain he underwent a CT Ango of his chest on March 21 which was negative for PE but showed diffuse peripheral lung densities throughout both upper and lower zones without honeycombing raising the [name] concern for mild fibrotic changes but the pattern was nonspecific.    Since I last saw him, he states that he has lost over 40 pounds him.  His peak weight.  He recently was seen in the emergency room with lightheadedness and was hypotensive.  He was told to discontinue his isosorbide as well as lisinopril.  He has felt improved.  In the setting of his hypotension.  He was found to have an increased creatinine at 1.4 recently had been 1.1.  He denies any episodes of chest pain.  He has noted some mild leg swelling.  He denies PND or orthopnea.  He presents for reevaluation.   Past Medical History  Diagnosis  Date  . CAD (coronary artery disease) 1999  . DDD (degenerative disc disease)   . HTN (hypertension)   . Obesity   . Hyperlipidemia   . Gout   . Peripheral edema     Past Surgical History  Procedure Laterality Date  . Back surgery      r/t DDD  . Cardiac catheterization      RCA, PDA  . Cardiac catheterization  april 2007    stenting LAD, distal RCA - Cypher stents  . US echocardiography  08/24/2010    Mild proximal septal thickening    Allergies  Allergen Reactions  . Ibuprofen Itching  . Neosporin [Neomycin-Bacitracin Zn-Polymyx] Swelling  . Penicillins Other (See Comments)    Patient doesn't remember reaction    Current Outpatient Prescriptions  Medication Sig Dispense Refill  . acetaminophen (TYLENOL) 500 MG tablet Take 1,000 mg by mouth 2 (two) times daily as needed for moderate pain.    Marland Kitchen allopurinol (ZYLOPRIM) 300 MG tablet Take 300 mg by mouth daily.     Marland Kitchen aspirin EC 81 MG tablet Take 81 mg by mouth daily.    . clopidogrel (PLAVIX) 75 MG tablet Take 1 tablet (75 mg total) by mouth daily. 90 tablet 3  . metoprolol (LOPRESSOR) 50 MG tablet Take 1 tablet (50 mg total) by mouth 2 (two) times daily. 180 tablet 3  . oxyCODONE-acetaminophen (PERCOCET) 10-325 MG per  tablet Take 1 tablet by mouth every 8 (eight) hours as needed for pain.     . predniSONE (DELTASONE) 10 MG tablet Take 4 tabs daily for 6 more days then resume home regimen. (Patient taking differently: as needed. Take 4 tabs daily for 6 more days then resume home regimen.prn) 24 tablet o  . simvastatin (ZOCOR) 20 MG tablet Take 1 tablet (20 mg total) by mouth at bedtime. 30 tablet 5  . tiZANidine (ZANAFLEX) 4 MG tablet Take 4 mg by mouth at bedtime.     . torsemide (DEMADEX) 20 MG tablet TAKE ONE (1) TABLET BY MOUTH EVERY DAY 30 tablet 5   No current facility-administered medications for this visit.    Socially he is married. There are no children. Has a remote tobacco history having quit approximately 16  years ago. He's not very active. He does not routinely exercise.  ROS General: Negative; No fevers, chills, or night sweats;  HEENT: Negative; No changes in vision or hearing, sinus congestion, difficulty swallowing Pulmonary: Positive for recent pleuritic chest pain Cardiovascular: See history of present illness GI: Negative; No nausea, vomiting, diarrhea, or abdominal pain GU: Negative; No dysuria, hematuria, or difficulty voiding Musculoskeletal: Negative; no myalgias, joint pain, or weakness Hematologic/Oncology: Negative; no easy bruising, bleeding Endocrine: Negative; no heat/cold intolerance; no diabetes Neuro: Negative; no changes in balance, headaches Skin: Negative; No rashes or skin lesions Psychiatric: Negative; No behavioral problems, depression Sleep: Negative; No snoring, daytime sleepiness, hypersomnolence, bruxism, restless legs, hypnogognic hallucinations, no cataplexy Other comprehensive 14 point system review is negative.   PE BP 130/73 mmHg  Pulse 61  Ht 6' 1"  (1.854 m)  Wt 242 lb 12.8 oz (110.133 kg)  BMI 32.04 kg/m2  SpO2 98%   Wt Readings from Last 3 Encounters:  11/21/15 242 lb 12.8 oz (110.133 kg)  09/12/15 244 lb (110.678 kg)  11/04/14 263 lb (119.296 kg)   General: Alert, oriented, no distress.  HEENT: Normocephalic, atraumatic. Pupils round and reactive; sclera anicteric;no lid lag,  Nose without nasal septal hypertrophy Mouth/Parynx benign; Mallinpatti scale 3 Neck: Thick neck ;No JVD, no carotid briuts Lungs: clear to ausculatation and percussion; no wheezing or rales Heart: RRR, s1 s2 normal 1/6 systolic murmur. Abdomen: soft, nontender; no hepatosplenomehaly, BS+; abdominal aorta nontender and not dilated by palpation. Pulses 2+ Extremities:  Bilateral pretibial edema; no clubbing cyanosis; Homan's sign negative  Neurologic: grossly nonfocal Psychological: Normal affect and mood  ECG (independently read by me): Normal sinus rhythm at 61  bpm.  No ectopy.  Normal intervals.  April 2016 ECG (independently read by me): Normal sinus rhythm at 67 bpm.  No ectopy.  Normal intervals.  October 2014 ECG: Normal sinus rhythm at 63 beats per minute. No significant ST abnormalities. Normal intervals.  LABS:  BMP Latest Ref Rng 09/12/2015 10/22/2014 08/12/2014  Glucose 65 - 99 mg/dL 108(H) - 90  BUN 6 - 20 mg/dL 32(H) - 19  Creatinine 0.61 - 1.24 mg/dL 1.40(H) 1.10 0.90  Sodium 135 - 145 mmol/L 140 - 137  Potassium 3.5 - 5.1 mmol/L 4.5 - 3.9  Chloride 101 - 111 mmol/L 104 - 101  CO2 22 - 32 mmol/L 29 - 28  Calcium 8.9 - 10.3 mg/dL 9.1 - 8.9    Hepatic Function Latest Ref Rng 08/10/2014 08/10/2014 08/09/2014  Total Protein 6.0 - 8.3 g/dL 7.2 7.2 7.0  Albumin 3.5 - 5.2 g/dL 3.4(L) 3.4(L) 3.6  AST 0 - 37 U/L 18 18 18   ALT 0 -  53 U/L 12 13 13   Alk Phosphatase 39 - 117 U/L 66 66 65  Total Bilirubin 0.3 - 1.2 mg/dL 2.3(H) 2.6(H) 2.7(H)  Bilirubin, Direct 0.0 - 0.5 mg/dL 0.4 - -   CBC Latest Ref Rng 09/12/2015 08/08/2014 02/12/2010  WBC 4.0 - 10.5 K/uL 8.4 10.5 10.6(H)  Hemoglobin 13.0 - 17.0 g/dL 14.9 14.6 13.8  Hematocrit 39.0 - 52.0 % 44.9 43.7 41.1  Platelets 150 - 400 K/uL 199 217 207   Lab Results  Component Value Date   MCV 85.7 09/12/2015   MCV 87.8 08/08/2014   MCV 86.5 02/12/2010    Lab Results  Component Value Date   TSH 0.707 02/12/2010   Lipid Panel     Component Value Date/Time   CHOL 133 08/10/2014 0555   TRIG 122 08/10/2014 0555   HDL 32* 08/10/2014 0555   CHOLHDL 4.2 08/10/2014 0555   VLDL 24 08/10/2014 0555   LDLCALC 77 08/10/2014 0555     RADIOLOGY: No results found.    ASSESSMENT AND PLAN: Maurice Munoz is a 70 year old Caucasian male 18 years since his initial intervention to his RCA and PDA and 10 years since stenting to his LAD and distal RCA.  He has a history of hypertension.  He has lost 42 pounds by his report.  He recently was seen in the emergency room following presyncope at which time he  was noted to be hypotensive and had new onset renal insufficiency.  I reviewed his emergency room records and evaluation.  He was taken off isosorbide mononitrate as well as his lisinopril.  His blood pressure today is stable and continues to be on metoprolol 59 g twice a day.  He takes torsemide 20 mg for his leg edema which is somewhat tense.  I have recommended repeat blood work be obtained to further evaluate his renal insufficiency prior to potentially adjusting his diuretic regimen.  I also reviewed with him.  Carotid studies which he had done in January 2017.  This showed plaque bilaterally with stable right internal carotid velocities in the 1-39% range and stable left internal carotid artery velocities which now are in the 60-79% range.  He is not having any angina.  His ECG remained stable.  He continues to be on simvastatin for hyperlipidemia.  He denies myalgias.  I will contact him regarding his blood work up.  Adjustments to his medical therapy or needed.  As long as he remains stable I'll see him in 6 months for reevaluation.  Time spent: 25 minutes  Troy Sine, MD, [facility]  11/23/2015 4:02 PM

## 2015-11-24 ENCOUNTER — Other Ambulatory Visit: Payer: Self-pay | Admitting: Cardiovascular Disease

## 2015-11-25 LAB — TSH: TSH: 1.15 u[IU]/mL (ref 0.450–4.500)

## 2015-11-25 LAB — COMPREHENSIVE METABOLIC PANEL
ALBUMIN: 4.1 g/dL (ref 3.6–4.8)
ALK PHOS: 88 IU/L (ref 39–117)
ALT: 14 IU/L (ref 0–44)
AST: 19 IU/L (ref 0–40)
Albumin/Globulin Ratio: 1.7 (ref 1.2–2.2)
BILIRUBIN TOTAL: 1 mg/dL (ref 0.0–1.2)
BUN / CREAT RATIO: 15 (ref 10–24)
BUN: 21 mg/dL (ref 8–27)
CHLORIDE: 102 mmol/L (ref 96–106)
CO2: 26 mmol/L (ref 18–29)
Calcium: 9.4 mg/dL (ref 8.6–10.2)
Creatinine, Ser: 1.36 mg/dL — ABNORMAL HIGH (ref 0.76–1.27)
GFR calc Af Amer: 62 mL/min/{1.73_m2} (ref >59)
GFR calc non Af Amer: 53 mL/min/{1.73_m2} — ABNORMAL LOW (ref >59)
GLUCOSE: 95 mg/dL (ref 65–99)
Globulin, Total: 2.4 g/dL (ref 1.5–4.5)
Potassium: 4.7 mmol/L (ref 3.5–5.2)
SODIUM: 143 mmol/L (ref 134–144)
Total Protein: 6.5 g/dL (ref 6.0–8.5)

## 2015-11-25 LAB — LIPID PANEL W/O CHOL/HDL RATIO
CHOLESTEROL TOTAL: 144 mg/dL (ref 100–199)
HDL: 39 mg/dL — ABNORMAL LOW (ref >39)
LDL Calculated: 79 mg/dL (ref 0–99)
Triglycerides: 131 mg/dL (ref 0–149)
VLDL CHOLESTEROL CAL: 26 mg/dL (ref 5–40)

## 2015-12-08 ENCOUNTER — Other Ambulatory Visit: Payer: Self-pay | Admitting: Cardiovascular Disease

## 2015-12-18 ENCOUNTER — Other Ambulatory Visit (HOSPITAL_COMMUNITY): Payer: Self-pay | Admitting: Cardiovascular Disease

## 2015-12-18 NOTE — Telephone Encounter (Signed)
Rx(s) sent to pharmacy electronically.  

## 2016-01-13 ENCOUNTER — Other Ambulatory Visit: Payer: Self-pay | Admitting: Cardiovascular Disease

## 2016-01-13 NOTE — Telephone Encounter (Signed)
REFILL 

## 2016-02-03 DIAGNOSIS — M26621 Arthralgia of right temporomandibular joint: Secondary | ICD-10-CM | POA: Insufficient documentation

## 2016-02-24 ENCOUNTER — Other Ambulatory Visit: Payer: Self-pay | Admitting: Cardiovascular Disease

## 2016-03-05 ENCOUNTER — Other Ambulatory Visit (HOSPITAL_COMMUNITY): Payer: Self-pay | Admitting: Pulmonary Disease

## 2016-03-05 DIAGNOSIS — J8410 Pulmonary fibrosis, unspecified: Secondary | ICD-10-CM

## 2016-03-22 ENCOUNTER — Ambulatory Visit (HOSPITAL_COMMUNITY)
Admission: RE | Admit: 2016-03-22 | Discharge: 2016-03-22 | Disposition: A | Discharge status: Home / Self Care | Payer: Medicare Other | Source: Ambulatory Visit | Attending: Pulmonary Disease | Admitting: Pulmonary Disease

## 2016-03-22 ENCOUNTER — Other Ambulatory Visit (HOSPITAL_COMMUNITY): Payer: Self-pay | Admitting: Pulmonary Disease

## 2016-03-22 ENCOUNTER — Encounter (HOSPITAL_COMMUNITY): Payer: Self-pay

## 2016-03-22 DIAGNOSIS — J8410 Pulmonary fibrosis, unspecified: Secondary | ICD-10-CM

## 2016-04-08 ENCOUNTER — Ambulatory Visit (HOSPITAL_COMMUNITY)
Admission: RE | Admit: 2016-04-08 | Discharge: 2016-04-08 | Disposition: A | Discharge status: Home / Self Care | Payer: Medicare Other | Source: Ambulatory Visit | Attending: Pulmonary Disease | Admitting: Pulmonary Disease

## 2016-04-08 DIAGNOSIS — D3502 Benign neoplasm of left adrenal gland: Secondary | ICD-10-CM | POA: Diagnosis not present

## 2016-04-08 DIAGNOSIS — I2510 Atherosclerotic heart disease of native coronary artery without angina pectoris: Secondary | ICD-10-CM | POA: Diagnosis not present

## 2016-04-08 DIAGNOSIS — J8410 Pulmonary fibrosis, unspecified: Secondary | ICD-10-CM | POA: Diagnosis not present

## 2016-04-08 DIAGNOSIS — I700 Atherosclerosis of aorta: Secondary | ICD-10-CM | POA: Insufficient documentation

## 2016-04-08 DIAGNOSIS — J479 Bronchiectasis, uncomplicated: Secondary | ICD-10-CM | POA: Diagnosis not present

## 2016-04-13 ENCOUNTER — Other Ambulatory Visit (HOSPITAL_COMMUNITY): Payer: Self-pay | Admitting: Nephrology

## 2016-04-13 DIAGNOSIS — N183 Chronic kidney disease, stage 3 unspecified: Secondary | ICD-10-CM

## 2016-04-23 ENCOUNTER — Ambulatory Visit (HOSPITAL_COMMUNITY)
Admission: RE | Admit: 2016-04-23 | Discharge: 2016-04-23 | Disposition: A | Discharge status: Home / Self Care | Payer: Medicare Other | Source: Ambulatory Visit | Attending: Nephrology | Admitting: Nephrology

## 2016-04-23 DIAGNOSIS — N183 Chronic kidney disease, stage 3 unspecified: Secondary | ICD-10-CM

## 2016-05-06 ENCOUNTER — Encounter (HOSPITAL_BASED_OUTPATIENT_CLINIC_OR_DEPARTMENT_OTHER): Payer: Medicare Other | Attending: Internal Medicine

## 2016-05-06 DIAGNOSIS — I10 Essential (primary) hypertension: Secondary | ICD-10-CM | POA: Diagnosis not present

## 2016-05-06 DIAGNOSIS — L89153 Pressure ulcer of sacral region, stage 3: Secondary | ICD-10-CM | POA: Insufficient documentation

## 2016-05-06 DIAGNOSIS — I2510 Atherosclerotic heart disease of native coronary artery without angina pectoris: Secondary | ICD-10-CM | POA: Insufficient documentation

## 2016-05-06 DIAGNOSIS — M109 Gout, unspecified: Secondary | ICD-10-CM | POA: Insufficient documentation

## 2016-05-07 ENCOUNTER — Encounter (HOSPITAL_BASED_OUTPATIENT_CLINIC_OR_DEPARTMENT_OTHER): Payer: Medicare Other

## 2016-05-20 DIAGNOSIS — L89153 Pressure ulcer of sacral region, stage 3: Secondary | ICD-10-CM | POA: Diagnosis not present

## 2016-06-02 ENCOUNTER — Other Ambulatory Visit: Payer: Self-pay | Admitting: Cardiovascular Disease

## 2016-06-02 NOTE — Telephone Encounter (Signed)
Rx(s) sent to pharmacy electronically.  

## 2016-06-03 ENCOUNTER — Encounter (HOSPITAL_BASED_OUTPATIENT_CLINIC_OR_DEPARTMENT_OTHER): Payer: Medicare Other | Attending: Internal Medicine

## 2016-06-03 DIAGNOSIS — I2510 Atherosclerotic heart disease of native coronary artery without angina pectoris: Secondary | ICD-10-CM | POA: Diagnosis not present

## 2016-06-03 DIAGNOSIS — L89153 Pressure ulcer of sacral region, stage 3: Secondary | ICD-10-CM | POA: Diagnosis not present

## 2016-06-11 ENCOUNTER — Encounter (HOSPITAL_COMMUNITY): Payer: Self-pay | Admitting: Cardiovascular Disease

## 2016-06-24 ENCOUNTER — Encounter (HOSPITAL_BASED_OUTPATIENT_CLINIC_OR_DEPARTMENT_OTHER): Payer: Medicare Other | Attending: Internal Medicine

## 2016-06-24 DIAGNOSIS — I2510 Atherosclerotic heart disease of native coronary artery without angina pectoris: Secondary | ICD-10-CM | POA: Insufficient documentation

## 2016-06-24 DIAGNOSIS — I10 Essential (primary) hypertension: Secondary | ICD-10-CM | POA: Diagnosis not present

## 2016-06-24 DIAGNOSIS — L89153 Pressure ulcer of sacral region, stage 3: Secondary | ICD-10-CM | POA: Diagnosis not present

## 2016-06-28 ENCOUNTER — Ambulatory Visit (INDEPENDENT_AMBULATORY_CARE_PROVIDER_SITE_OTHER): Payer: Medicare Other | Admitting: Cardiovascular Disease

## 2016-06-28 ENCOUNTER — Encounter: Payer: Self-pay | Admitting: Cardiovascular Disease

## 2016-06-28 VITALS — BP 142/78 | HR 60 | Ht 72.0 in | Wt 247.0 lb

## 2016-06-28 DIAGNOSIS — I10 Essential (primary) hypertension: Secondary | ICD-10-CM

## 2016-06-28 DIAGNOSIS — N183 Chronic kidney disease, stage 3 unspecified: Secondary | ICD-10-CM

## 2016-06-28 DIAGNOSIS — E669 Obesity, unspecified: Secondary | ICD-10-CM | POA: Diagnosis not present

## 2016-06-28 DIAGNOSIS — I6523 Occlusion and stenosis of bilateral carotid arteries: Secondary | ICD-10-CM | POA: Diagnosis not present

## 2016-06-28 DIAGNOSIS — E785 Hyperlipidemia, unspecified: Secondary | ICD-10-CM | POA: Diagnosis not present

## 2016-06-28 DIAGNOSIS — R600 Localized edema: Secondary | ICD-10-CM

## 2016-06-28 DIAGNOSIS — I2510 Atherosclerotic heart disease of native coronary artery without angina pectoris: Secondary | ICD-10-CM | POA: Diagnosis not present

## 2016-06-28 NOTE — Patient Instructions (Signed)
Your physician wants you to follow-up in: 6 months or sooner if needed. You will receive a reminder letter in the mail two months in advance. If you don't receive a letter, please call our office to schedule the follow-up appointment.   If you need a refill on your cardiac medications before your next appointment, please call your pharmacy. 

## 2016-06-30 NOTE — Progress Notes (Signed)
Patient ID: Maurice Munoz, male   DOB: 1945/10/29, 70 y.o.   MRN: [id number]      HPI: Maurice Munoz, is a 71 y.o. male who presents to the office today for a 7 month cardiology followup evaluation.     Maurice Munoz has established CAD dating back to 1999 when he underwent intervention to his RCA and PDA. In April 2007 he underwent stenting to his LAD and distal RCA at which time Cypher DES stents were inserted. He has a history of hypertension, obesity, hyperlipidemia, gout, and  lower extremity edema.   A two-year followup nuclear perfusion study in March 2014 was essentially unchanged from his prior study and continued to show fairly normal perfusion and was low risk, without evidence for scar. There was no significant ischemia.  In 2015 he had of the Plavix for 3 months due to injury to his right leg.  He has noted some intermittent leg swelling.  He had carotids Doppler study in January 2016 which showed 50-60% left proximal ICA diameter reduction, which had slightly increased from his prior study one year previously.  Because of a history of coughing up blood with pleuritic chest pain he underwent a CT Ango of his chest on March 21 which was negative for PE but showed diffuse peripheral lung densities throughout both upper and lower zones without honeycombing raising the Maurice Munoz concern for mild fibrotic changes but the pattern was nonspecific.    Over the last several years he has lost over 40 pounds from his peak weight.  He  was seen in the emergency room with lightheadedness and was hypotensive.  He was told to discontinue his isosorbide as well as lisinopril.   In the setting of his hypotension he was found to have an increased creatinine at 1.4 recently had been 1.1.  When I saw him last in follow-up.  He was doing well.  He is being followed Maurice Munoz in [place] for renal insufficiency.  He will be having blood work next month.  He also has had a difficult to heal wound in  his back which she has been seen at the pain clinic.  He denies any episodes of chest pain.  He denies palpitations.  He continues to be on dual antiplatelet therapy.  He is now back on lisinopril at 5 mg and tolerating this well as well as metoprolol 50 mg twice a day without dizziness.  He has been taking torsemide on an as-needed basis and typically takes approximately 2 times per week.  He presents for reevaluation.  Past Medical History:  Diagnosis Date  . CAD (coronary artery disease) 1999  . DDD (degenerative disc disease)   . Gout   . HTN (hypertension)   . Hyperlipidemia   . Obesity   . Peripheral edema     Past Surgical History:  Procedure Laterality Date  . BACK SURGERY     r/t DDD  . CARDIAC CATHETERIZATION     RCA, PDA  . CARDIAC CATHETERIZATION  april 2007   stenting LAD, distal RCA - Cypher stents  . US ECHOCARDIOGRAPHY  08/24/2010   Mild proximal septal thickening    Allergies  Allergen Reactions  . Ibuprofen Itching  . Neosporin [Neomycin-Bacitracin Zn-Polymyx] Swelling  . Penicillins Other (See Comments)    Patient doesn't remember reaction    Current Outpatient Prescriptions  Medication Sig Dispense Refill  . allopurinol (ZYLOPRIM) 300 MG tablet Take 300 mg by mouth daily.     Marland Kitchen  aspirin EC 81 MG tablet Take 81 mg by mouth daily.    . cholecalciferol (VITAMIN D) 1000 units tablet Take 1,000 Units by mouth daily.    . clopidogrel (PLAVIX) 75 MG tablet TAKE ONE (1) TABLET BY MOUTH EVERY DAY 90 tablet 0  . lisinopril (PRINIVIL,ZESTRIL) 5 MG tablet Take 5 mg by mouth daily.    . metoprolol (LOPRESSOR) 50 MG tablet TAKE 1 TABLET BY MOUTH TWICE DAILY 180 tablet 2  . oxyCODONE-acetaminophen (PERCOCET) 10-325 MG per tablet Take 1 tablet by mouth every 8 (eight) hours as needed for pain.     . predniSONE (DELTASONE) 10 MG tablet Take 4 tabs daily for 6 more days then resume home regimen. (Patient taking differently: as needed. Take 4 tabs daily for 6 more days then  resume home regimen.prn) 24 tablet o  . simvastatin (ZOCOR) 20 MG tablet TAKE ONE TABLET DAILY AT BEDTIME 30 tablet 5  . tiZANidine (ZANAFLEX) 4 MG tablet Take 4 mg by mouth at bedtime.     . torsemide (DEMADEX) 20 MG tablet TAKE ONE (1) TABLET BY MOUTH EVERY DAY 30 tablet 10   No current facility-administered medications for this visit.     Socially he is married. There are no children. Has a remote tobacco history having quit approximately 16 years ago. He's not very active. He does not routinely exercise.  ROS General: Negative; No fevers, chills, or night sweats;  HEENT: Negative; No changes in vision or hearing, sinus congestion, difficulty swallowing Pulmonary: Positive for recent pleuritic chest pain Cardiovascular: See history of present illness GI: Negative; No nausea, vomiting, diarrhea, or abdominal pain GU: Negative; No dysuria, hematuria, or difficulty voiding Musculoskeletal: Negative; no myalgias, joint pain, or weakness Hematologic/Oncology: Negative; no easy bruising, bleeding Endocrine: Negative; no heat/cold intolerance; no diabetes Neuro: Negative; no changes in balance, headaches Skin: Negative; No rashes or skin lesions Psychiatric: Negative; No behavioral problems, depression Sleep: Negative; No snoring, daytime sleepiness, hypersomnolence, bruxism, restless legs, hypnogognic hallucinations, no cataplexy Other comprehensive 14 point system review is negative.   PE BP (!) 142/78   Pulse 60   Ht 6' (1.829 m)   Wt 247 lb (112 kg)   BMI 33.50 kg/m    Wt Readings from Last 3 Encounters:  06/28/16 247 lb (112 kg)  11/21/15 242 lb 12.8 oz (110.1 kg)  09/12/15 244 lb (110.7 kg)   General: Alert, oriented, no distress.  HEENT: Normocephalic, atraumatic. Pupils round and reactive; sclera anicteric;no lid lag,  Nose without nasal septal hypertrophy Mouth/Parynx benign; Mallinpatti scale 3 Neck: Thick neck ;No JVD, no carotid briuts Lungs: clear to  ausculatation and percussion; no wheezing or rales Heart: RRR, s1 s2 normal 1/6 systolic murmur. Abdomen: soft, nontender; no hepatosplenomehaly, BS+; abdominal aorta nontender and not dilated by palpation. Pulses 2+ Extremities:  Trivial pretibial edema; no clubbing cyanosis; Homan's sign negative  Neurologic: grossly nonfocal Psychological: Normal affect and mood  ECG (independently read by me): Sinus rhythm at 60 bpm.  No ectopy.  Normal intervals.  June 2017 ECG (independently read by me): Normal sinus rhythm at 61 bpm.  No ectopy.  Normal intervals.  April 2016 ECG (independently read by me): Normal sinus rhythm at 67 bpm.  No ectopy.  Normal intervals.  October 2014 ECG: Normal sinus rhythm at 63 beats per minute. No significant ST abnormalities. Normal intervals.  LABS:  BMP Latest Ref Rng & Units 11/24/2015 09/12/2015 10/22/2014  Glucose 65 - 99 mg/dL 95 108(H) -  BUN 8 -  27 mg/dL 21 32(H) -  Creatinine 0.76 - 1.27 mg/dL 1.36(H) 1.40(H) 1.10  BUN/Creat Ratio 10 - 24 15 - -  Sodium 134 - 144 mmol/L 143 140 -  Potassium 3.5 - 5.2 mmol/L 4.7 4.5 -  Chloride 96 - 106 mmol/L 102 104 -  CO2 18 - 29 mmol/L 26 29 -  Calcium 8.6 - 10.2 mg/dL 9.4 9.1 -    Hepatic Function Latest Ref Rng & Units 11/24/2015 08/10/2014 08/10/2014  Total Protein 6.0 - 8.5 g/dL 6.5 7.2 7.2  Albumin 3.6 - 4.8 g/dL 4.1 3.4(L) 3.4(L)  AST 0 - 40 IU/L 19 18 18   ALT 0 - 44 IU/L 14 12 13   Alk Phosphatase 39 - 117 IU/L 88 66 66  Total Bilirubin 0.0 - 1.2 mg/dL 1.0 2.3(H) 2.6(H)  Bilirubin, Direct 0.0 - 0.5 mg/dL - 0.4 -   CBC Latest Ref Rng & Units 09/12/2015 08/08/2014 02/12/2010  WBC 4.0 - 10.5 K/uL 8.4 10.5 10.6(H)  Hemoglobin 13.0 - 17.0 g/dL 14.9 14.6 13.8  Hematocrit 39.0 - 52.0 % 44.9 43.7 41.1  Platelets 150 - 400 K/uL 199 217 207   Lab Results  Component Value Date   MCV 85.7 09/12/2015   MCV 87.8 08/08/2014   MCV 86.5 02/12/2010    Lab Results  Component Value Date   TSH 1.150 11/24/2015    Lipid Panel     Component Value Date/Time   CHOL 144 11/24/2015 1127   TRIG 131 11/24/2015 1127   HDL 39 (L) 11/24/2015 1127   CHOLHDL 4.2 08/10/2014 0555   VLDL 24 08/10/2014 0555   LDLCALC 79 11/24/2015 1127     RADIOLOGY: No results found.  IMPRESSION:  1. CAD in native artery   2. Carotid stenosis, bilateral   3. Obesity (BMI 30-39.9)   4. Hyperlipidemia LDL goal <70   5. Bilateral edema of lower extremity   6. Stage III chronic kidney disease   7. Essential hypertension      ASSESSMENT AND PLAN: Maurice Munoz is a 71 year old Caucasian male 18 years since his initial intervention to his RCA and PDA and 10 years since stenting to his LAD and distal RCA.  He has a history of hypertension.  His weight has been fairly stable after losing approximately 40 pounds from his peak weight.  BMI is still increased at 33 compatible with mild obesity.  His blood pressure today on repeat by me was 138/80.  He has been taking torsemide 2-3 days per week and this may need to be increased to 3 days per week depending upon edema, renal function and blood pressure.  He has stage III chronic kidney disease.  He is not having any bleeding and continues to take DAPT  for CAD.  He continues to be on simvastatin 20 mg for hyperlipidemia with target LDL less than 70.  He will be having follow-up blood work with his renal physician next month.  As long as he remains stable I will see him in 6 months for reevaluation. Time spent: 25 minutes  Troy Sine, MD, [facility]  06/30/2016 7:40 PM

## 2016-07-12 DIAGNOSIS — L89153 Pressure ulcer of sacral region, stage 3: Secondary | ICD-10-CM | POA: Diagnosis not present

## 2016-07-12 DIAGNOSIS — I10 Essential (primary) hypertension: Secondary | ICD-10-CM | POA: Diagnosis not present

## 2016-07-12 DIAGNOSIS — I2510 Atherosclerotic heart disease of native coronary artery without angina pectoris: Secondary | ICD-10-CM | POA: Diagnosis not present

## 2016-07-15 DIAGNOSIS — J8410 Pulmonary fibrosis, unspecified: Secondary | ICD-10-CM | POA: Diagnosis not present

## 2016-07-15 DIAGNOSIS — Z1389 Encounter for screening for other disorder: Secondary | ICD-10-CM | POA: Diagnosis not present

## 2016-07-15 DIAGNOSIS — M48061 Spinal stenosis, lumbar region without neurogenic claudication: Secondary | ICD-10-CM | POA: Diagnosis not present

## 2016-07-15 DIAGNOSIS — G894 Chronic pain syndrome: Secondary | ICD-10-CM | POA: Diagnosis not present

## 2016-08-09 ENCOUNTER — Encounter (HOSPITAL_BASED_OUTPATIENT_CLINIC_OR_DEPARTMENT_OTHER): Payer: Medicare Other | Attending: Nurse Practitioner

## 2016-08-09 DIAGNOSIS — Z9181 History of falling: Secondary | ICD-10-CM | POA: Diagnosis not present

## 2016-08-09 DIAGNOSIS — Z09 Encounter for follow-up examination after completed treatment for conditions other than malignant neoplasm: Secondary | ICD-10-CM | POA: Diagnosis not present

## 2016-08-09 DIAGNOSIS — L89159 Pressure ulcer of sacral region, unspecified stage: Secondary | ICD-10-CM | POA: Diagnosis not present

## 2016-08-09 DIAGNOSIS — I2510 Atherosclerotic heart disease of native coronary artery without angina pectoris: Secondary | ICD-10-CM | POA: Diagnosis not present

## 2016-08-09 DIAGNOSIS — Z872 Personal history of diseases of the skin and subcutaneous tissue: Secondary | ICD-10-CM | POA: Diagnosis not present

## 2016-08-10 ENCOUNTER — Other Ambulatory Visit: Payer: Self-pay | Admitting: Cardiovascular Disease

## 2016-08-13 DIAGNOSIS — R809 Proteinuria, unspecified: Secondary | ICD-10-CM | POA: Diagnosis not present

## 2016-08-13 DIAGNOSIS — N183 Chronic kidney disease, stage 3 (moderate): Secondary | ICD-10-CM | POA: Diagnosis not present

## 2016-08-13 DIAGNOSIS — D509 Iron deficiency anemia, unspecified: Secondary | ICD-10-CM | POA: Diagnosis not present

## 2016-08-13 DIAGNOSIS — Z79899 Other long term (current) drug therapy: Secondary | ICD-10-CM | POA: Diagnosis not present

## 2016-08-13 DIAGNOSIS — E559 Vitamin D deficiency, unspecified: Secondary | ICD-10-CM | POA: Diagnosis not present

## 2016-08-18 DIAGNOSIS — E559 Vitamin D deficiency, unspecified: Secondary | ICD-10-CM | POA: Diagnosis not present

## 2016-08-18 DIAGNOSIS — N183 Chronic kidney disease, stage 3 (moderate): Secondary | ICD-10-CM | POA: Diagnosis not present

## 2016-08-18 DIAGNOSIS — R809 Proteinuria, unspecified: Secondary | ICD-10-CM | POA: Diagnosis not present

## 2016-09-07 ENCOUNTER — Other Ambulatory Visit: Payer: Self-pay | Admitting: Cardiovascular Disease

## 2016-09-07 NOTE — Telephone Encounter (Signed)
Rx(s) sent to pharmacy electronically.  

## 2016-10-11 ENCOUNTER — Ambulatory Visit (HOSPITAL_COMMUNITY)
Admission: RE | Admit: 2016-10-11 | Discharge: 2016-10-11 | Disposition: A | Discharge status: Home / Self Care | Payer: Medicare Other | Source: Ambulatory Visit | Attending: Pulmonary Disease | Admitting: Pulmonary Disease

## 2016-10-11 ENCOUNTER — Other Ambulatory Visit (HOSPITAL_COMMUNITY): Payer: Self-pay | Admitting: Pulmonary Disease

## 2016-10-11 DIAGNOSIS — R079 Chest pain, unspecified: Secondary | ICD-10-CM | POA: Diagnosis not present

## 2016-10-11 DIAGNOSIS — R042 Hemoptysis: Secondary | ICD-10-CM | POA: Insufficient documentation

## 2016-10-11 DIAGNOSIS — I517 Cardiomegaly: Secondary | ICD-10-CM | POA: Diagnosis not present

## 2016-10-11 DIAGNOSIS — I10 Essential (primary) hypertension: Secondary | ICD-10-CM | POA: Diagnosis not present

## 2016-10-11 DIAGNOSIS — J8410 Pulmonary fibrosis, unspecified: Secondary | ICD-10-CM | POA: Insufficient documentation

## 2016-11-19 DIAGNOSIS — I10 Essential (primary) hypertension: Secondary | ICD-10-CM | POA: Diagnosis not present

## 2016-11-19 DIAGNOSIS — G894 Chronic pain syndrome: Secondary | ICD-10-CM | POA: Diagnosis not present

## 2016-11-19 DIAGNOSIS — Z1389 Encounter for screening for other disorder: Secondary | ICD-10-CM | POA: Diagnosis not present

## 2016-11-23 DIAGNOSIS — M47816 Spondylosis without myelopathy or radiculopathy, lumbar region: Secondary | ICD-10-CM | POA: Diagnosis not present

## 2016-11-23 DIAGNOSIS — M4302 Spondylolysis, cervical region: Secondary | ICD-10-CM | POA: Diagnosis not present

## 2016-11-23 DIAGNOSIS — M488X6 Other specified spondylopathies, lumbar region: Secondary | ICD-10-CM | POA: Diagnosis not present

## 2017-02-11 DIAGNOSIS — D485 Neoplasm of uncertain behavior of skin: Secondary | ICD-10-CM | POA: Diagnosis not present

## 2017-02-11 DIAGNOSIS — I10 Essential (primary) hypertension: Secondary | ICD-10-CM | POA: Diagnosis not present

## 2017-02-11 DIAGNOSIS — R809 Proteinuria, unspecified: Secondary | ICD-10-CM | POA: Diagnosis not present

## 2017-02-11 DIAGNOSIS — Z79899 Other long term (current) drug therapy: Secondary | ICD-10-CM | POA: Diagnosis not present

## 2017-02-11 DIAGNOSIS — Z1389 Encounter for screening for other disorder: Secondary | ICD-10-CM | POA: Diagnosis not present

## 2017-02-11 DIAGNOSIS — E559 Vitamin D deficiency, unspecified: Secondary | ICD-10-CM | POA: Diagnosis not present

## 2017-02-11 DIAGNOSIS — D509 Iron deficiency anemia, unspecified: Secondary | ICD-10-CM | POA: Diagnosis not present

## 2017-02-11 DIAGNOSIS — R040 Epistaxis: Secondary | ICD-10-CM | POA: Diagnosis not present

## 2017-02-11 DIAGNOSIS — L0591 Pilonidal cyst without abscess: Secondary | ICD-10-CM | POA: Diagnosis not present

## 2017-02-11 DIAGNOSIS — J8410 Pulmonary fibrosis, unspecified: Secondary | ICD-10-CM | POA: Diagnosis not present

## 2017-02-11 DIAGNOSIS — N183 Chronic kidney disease, stage 3 (moderate): Secondary | ICD-10-CM | POA: Diagnosis not present

## 2017-02-15 DIAGNOSIS — Z1211 Encounter for screening for malignant neoplasm of colon: Secondary | ICD-10-CM | POA: Diagnosis not present

## 2017-02-16 DIAGNOSIS — R809 Proteinuria, unspecified: Secondary | ICD-10-CM | POA: Diagnosis not present

## 2017-02-16 DIAGNOSIS — E559 Vitamin D deficiency, unspecified: Secondary | ICD-10-CM | POA: Diagnosis not present

## 2017-02-16 DIAGNOSIS — N183 Chronic kidney disease, stage 3 (moderate): Secondary | ICD-10-CM | POA: Diagnosis not present

## 2017-02-23 ENCOUNTER — Other Ambulatory Visit: Payer: Self-pay | Admitting: General Surgery

## 2017-02-23 DIAGNOSIS — L988 Other specified disorders of the skin and subcutaneous tissue: Secondary | ICD-10-CM | POA: Diagnosis not present

## 2017-02-23 NOTE — H&P (Addendum)
e  History of Present Illness Maurice Ruff MD; 12/25/8240 10:21 AM) The patient is a 71 year old male who presents with a pilonidal cyst. 71 year old male who presents to the office for a pilonidal fistula. He states that is been present for couple years. He does have a history of pilonidal surgery approximately 40 years ago. He reports that it becomes painful and drains every few weeks. It opened up to a larger sore several months ago and he was referred to the wound clinic for evaluation. They treated the external lesion but he continues to have pain in the area. He is here today for evaluation. He takes prednisone occasionally for gout and back pain. He denies any smoking or history of diabetes.   Past Surgical History (Tanisha A. Owens Shark, [place]; 02/23/2017 10:03 AM) Spinal Surgery - Lower Back Spinal Surgery - Neck  Diagnostic Studies History (Tanisha A. Owens Shark, [place]; 02/23/2017 10:03 AM) Colonoscopy >10 years ago  Allergies (Tanisha A. Owens Shark, [place]; 02/23/2017 10:05 AM) Penicillins Motrin *ANALGESICS - ANTI-INFLAMMATORY* Allergies Reconciled  Medication History (Tanisha A. Owens Shark, [place]; 02/23/2017 10:06 AM) Metoprolol Tartrate (50MG  Tablet, Oral) Active. Lisinopril (5MG  Tablet, Oral) Active. PredniSONE (10MG  Tablet, Oral) Active. Simvastatin (20MG  Tablet, Oral) Active. Plavix (75MG  Tablet, Oral) Active. Allopurinol (300MG  Tablet, Oral) Active. Medications Reconciled  Social History (Tanisha A. Owens Shark, [place]; 02/23/2017 10:03 AM) Alcohol use Remotely quit alcohol use. Caffeine use Coffee. No drug use Tobacco use Former smoker.  Family History (Tanisha A. Owens Shark, [place]; 02/23/2017 10:03 AM) Diabetes Mellitus Brother, Sister. Heart Disease Brother. Respiratory Condition Father.  Other Problems (Tanisha A. Owens Shark, [place]; 02/23/2017 10:03 AM) Hypercholesterolemia     Review of Systems (Tanisha A. Brown RMA; 02/23/2017 10:03 AM) General Not Present- Appetite Loss, Chills,  Fatigue, Fever, Night Sweats, Weight Gain and Weight Loss. Skin Present- Non-Healing Wounds. Not Present- Change in Wart/Mole, Dryness, Hives, Jaundice, New Lesions, Rash and Ulcer. HEENT Present- Nose Bleed, Sore Throat and Wears glasses/contact lenses. Not Present- Earache, Hearing Loss, Hoarseness, Oral Ulcers, Ringing in the Ears, Seasonal Allergies, Sinus Pain, Visual Disturbances and Yellow Eyes. Respiratory Present- Bloody sputum. Not Present- Chronic Cough, Difficulty Breathing, Snoring and Wheezing. Breast Not Present- Breast Mass, Breast Pain, Nipple Discharge and Skin Changes. Cardiovascular Present- Leg Cramps and Swelling of Extremities. Not Present- Chest Pain, Difficulty Breathing Lying Down, Palpitations, Rapid Heart Rate and Shortness of Breath. Gastrointestinal Present- Constipation. Not Present- Abdominal Pain, Bloating, Bloody Stool, Change in Bowel Habits, Chronic diarrhea, Difficulty Swallowing, Excessive gas, Gets full quickly at meals, Hemorrhoids, Indigestion, Nausea, Rectal Pain and Vomiting. Male Genitourinary Not Present- Blood in Urine, Change in Urinary Stream, Frequency, Impotence, Nocturia, Painful Urination, Urgency and Urine Leakage. Musculoskeletal Present- Back Pain. Not Present- Joint Pain, Joint Stiffness, Muscle Pain, Muscle Weakness and Swelling of Extremities. Neurological Not Present- Decreased Memory, Fainting, Headaches, Numbness, Seizures, Tingling, Tremor, Trouble walking and Weakness. Psychiatric Not Present- Anxiety, Bipolar, Change in Sleep Pattern, Depression, Fearful and Frequent crying. Endocrine Not Present- Cold Intolerance, Excessive Hunger, Hair Changes, Heat Intolerance, Hot flashes and New Diabetes. Hematology Present- Blood Thinners and Easy Bruising. Not Present- Excessive bleeding, Gland problems, HIV and Persistent Infections.  Vitals (Tanisha A. Brown RMA; 02/23/2017 10:04 AM) 02/23/2017 10:03 AM Weight: 257.8 lb Height: 73in Body  Surface Area: 2.4 m Body Mass Index: 34.01 kg/m  Temp.: 97.56F  Pulse: 82 (Regular)  P.OX: 98% (Room air) BP: 122/74 (Sitting, Left Arm, Standard)      Physical Exam Maurice Ruff MD; 08/23/3612 10:30 AM)  General Mental Status-Alert.  General Appearance-Not in acute distress. Build & Nutrition-Well nourished. Posture-Normal posture. Gait-Normal.  Head and Neck Head-normocephalic, atraumatic with no lesions or palpable masses. Trachea-midline.  Chest and Lung Exam Chest and lung exam reveals -on auscultation, normal breath sounds, no adventitious sounds and normal vocal resonance.  Cardiovascular Cardiovascular examination reveals -normal heart sounds, regular rate and rhythm with no murmurs and no digital clubbing, cyanosis, edema, increased warmth or tenderness.  Abdomen Inspection Inspection of the abdomen reveals - No Hernias. Palpation/Percussion Palpation and Percussion of the abdomen reveal - Soft, Non Tender, No Rigidity (guarding), No hepatosplenomegaly and No Palpable abdominal masses.  Rectal Area of skin changes and small opening Posterior midline within the glucleft.  Neurologic Neurologic evaluation reveals -alert and oriented x 3 with no impairment of recent or remote memory, normal attention span and ability to concentrate, normal sensation and normal coordination.  Musculoskeletal Normal Exam - Bilateral-Upper Extremity Strength Normal and Lower Extremity Strength Normal.    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 On exam he has a pilonidal opening with significant skin changes. I suspect this fistula as his to one side or the other. I have recommended debridement and closure with possibly a rotational flap. I will most likely send the excisional area  for biopsy.This was discussed with the patient in detail. We discussed that he has a pretty high likelihood of not healing the wound completely. He is willing to undergo the procedure and understands the risks involved. He does take Plavix on a daily basis but there is no need for him to stop this prior to surgery.

## 2017-03-09 ENCOUNTER — Other Ambulatory Visit: Payer: Self-pay | Admitting: Cardiovascular Disease

## 2017-03-24 ENCOUNTER — Ambulatory Visit (INDEPENDENT_AMBULATORY_CARE_PROVIDER_SITE_OTHER): Payer: Medicare Other | Admitting: Otolaryngology

## 2017-03-24 DIAGNOSIS — R040 Epistaxis: Secondary | ICD-10-CM

## 2017-03-28 ENCOUNTER — Encounter (HOSPITAL_BASED_OUTPATIENT_CLINIC_OR_DEPARTMENT_OTHER): Payer: Self-pay | Admitting: *Deleted

## 2017-03-28 ENCOUNTER — Other Ambulatory Visit (HOSPITAL_COMMUNITY): Payer: Self-pay | Admitting: Pulmonary Disease

## 2017-03-28 DIAGNOSIS — J8410 Pulmonary fibrosis, unspecified: Secondary | ICD-10-CM

## 2017-03-28 NOTE — Progress Notes (Addendum)
NPO AFTER MN.  ARRIVE AT 0530.  NEEDS ISTAT 8.  CURRENT EKG AND CHEST CT IN CHART AND EPIC.  WILL TAKE METOPROLOL AM DOS W/ SIPS OF WATER AND IF NEEDED TAKE PERCOCET.

## 2017-03-31 ENCOUNTER — Encounter (HOSPITAL_BASED_OUTPATIENT_CLINIC_OR_DEPARTMENT_OTHER)
Admission: RE | Disposition: A | Discharge status: Home / Self Care | Payer: Self-pay | Source: Ambulatory Visit | Attending: General Surgery

## 2017-03-31 ENCOUNTER — Ambulatory Visit (HOSPITAL_BASED_OUTPATIENT_CLINIC_OR_DEPARTMENT_OTHER): Payer: Medicare Other | Admitting: Anesthesiology

## 2017-03-31 ENCOUNTER — Ambulatory Visit (HOSPITAL_BASED_OUTPATIENT_CLINIC_OR_DEPARTMENT_OTHER)
Admission: RE | Admit: 2017-03-31 | Discharge: 2017-03-31 | Disposition: A | Discharge status: Home / Self Care | Payer: Medicare Other | Source: Ambulatory Visit | Attending: General Surgery | Admitting: General Surgery

## 2017-03-31 ENCOUNTER — Encounter (HOSPITAL_BASED_OUTPATIENT_CLINIC_OR_DEPARTMENT_OTHER): Payer: Self-pay | Admitting: *Deleted

## 2017-03-31 DIAGNOSIS — Z880 Allergy status to penicillin: Secondary | ICD-10-CM | POA: Insufficient documentation

## 2017-03-31 DIAGNOSIS — Z886 Allergy status to analgesic agent status: Secondary | ICD-10-CM | POA: Diagnosis not present

## 2017-03-31 DIAGNOSIS — Z8249 Family history of ischemic heart disease and other diseases of the circulatory system: Secondary | ICD-10-CM | POA: Diagnosis not present

## 2017-03-31 DIAGNOSIS — M109 Gout, unspecified: Secondary | ICD-10-CM | POA: Diagnosis not present

## 2017-03-31 DIAGNOSIS — Z836 Family history of other diseases of the respiratory system: Secondary | ICD-10-CM | POA: Diagnosis not present

## 2017-03-31 DIAGNOSIS — Z833 Family history of diabetes mellitus: Secondary | ICD-10-CM | POA: Insufficient documentation

## 2017-03-31 DIAGNOSIS — L0591 Pilonidal cyst without abscess: Secondary | ICD-10-CM | POA: Diagnosis not present

## 2017-03-31 DIAGNOSIS — Z87891 Personal history of nicotine dependence: Secondary | ICD-10-CM | POA: Insufficient documentation

## 2017-03-31 DIAGNOSIS — I10 Essential (primary) hypertension: Secondary | ICD-10-CM | POA: Insufficient documentation

## 2017-03-31 DIAGNOSIS — Z79899 Other long term (current) drug therapy: Secondary | ICD-10-CM | POA: Diagnosis not present

## 2017-03-31 DIAGNOSIS — E785 Hyperlipidemia, unspecified: Secondary | ICD-10-CM | POA: Diagnosis not present

## 2017-03-31 DIAGNOSIS — Z7902 Long term (current) use of antithrombotics/antiplatelets: Secondary | ICD-10-CM | POA: Diagnosis not present

## 2017-03-31 HISTORY — DX: Other constipation: K59.09

## 2017-03-31 HISTORY — DX: Personal history of urinary calculi: Z87.442

## 2017-03-31 HISTORY — DX: Chronic pain syndrome: G89.4

## 2017-03-31 HISTORY — DX: Dyspnea, unspecified: R06.00

## 2017-03-31 HISTORY — DX: Other forms of dyspnea: R06.09

## 2017-03-31 HISTORY — DX: Occlusion and stenosis of bilateral carotid arteries: I65.23

## 2017-03-31 HISTORY — DX: Chronic kidney disease, stage 3 unspecified: N18.30

## 2017-03-31 HISTORY — DX: Personal history of other diseases of the digestive system: Z87.19

## 2017-03-31 HISTORY — DX: Other intervertebral disc degeneration, lumbosacral region without mention of lumbar back pain or lower extremity pain: M51.379

## 2017-03-31 HISTORY — DX: Other intervertebral disc degeneration, lumbosacral region: M51.37

## 2017-03-31 HISTORY — DX: Nocturia: R35.1

## 2017-03-31 HISTORY — DX: Personal history of other specified conditions: Z87.898

## 2017-03-31 HISTORY — DX: Other cervical disc degeneration, unspecified cervical region: M50.30

## 2017-03-31 HISTORY — DX: Presence of spectacles and contact lenses: Z97.3

## 2017-03-31 HISTORY — DX: Chronic kidney disease, stage 3 (moderate): N18.3

## 2017-03-31 HISTORY — PX: PILONIDAL CYST EXCISION: SHX744

## 2017-03-31 HISTORY — DX: Presence of coronary angioplasty implant and graft: Z95.5

## 2017-03-31 HISTORY — DX: Anesthesia of skin: R20.0

## 2017-03-31 HISTORY — DX: Presence of dental prosthetic device (complete) (partial): Z97.2

## 2017-03-31 HISTORY — DX: Pulmonary fibrosis, unspecified: J84.10

## 2017-03-31 LAB — POCT I-STAT, CHEM 8
BUN: 16 mg/dL (ref 6–20)
CALCIUM ION: 1.17 mmol/L (ref 1.15–1.40)
Chloride: 101 mmol/L (ref 101–111)
Creatinine, Ser: 1.2 mg/dL (ref 0.61–1.24)
Glucose, Bld: 98 mg/dL (ref 65–99)
HEMATOCRIT: 42 % (ref 39.0–52.0)
HEMOGLOBIN: 14.3 g/dL (ref 13.0–17.0)
Potassium: 4.2 mmol/L (ref 3.5–5.1)
SODIUM: 141 mmol/L (ref 135–145)
TCO2: 28 mmol/L (ref 22–32)

## 2017-03-31 SURGERY — EXCISION, PILONIDAL CYST, EXTENSIVE
Anesthesia: General | Site: Coccyx

## 2017-03-31 MED ORDER — FENTANYL CITRATE (PF) 100 MCG/2ML IJ SOLN
INTRAMUSCULAR | Status: AC
  Filled 2017-03-31: qty 2

## 2017-03-31 MED ORDER — CLINDAMYCIN PHOSPHATE 600 MG/50ML IV SOLN
600.0000 mg | INTRAVENOUS | Status: AC
  Administered 2017-03-31: 600 mg via INTRAVENOUS
  Filled 2017-03-31: qty 50

## 2017-03-31 MED ORDER — PROPOFOL 500 MG/50ML IV EMUL
INTRAVENOUS | Status: DC | PRN
  Administered 2017-03-31: 50 ug/kg/min via INTRAVENOUS

## 2017-03-31 MED ORDER — MIDAZOLAM HCL 2 MG/2ML IJ SOLN
INTRAMUSCULAR | Status: AC
  Filled 2017-03-31: qty 4

## 2017-03-31 MED ORDER — OXYCODONE HCL 5 MG/5ML PO SOLN
5.0000 mg | Freq: Once | ORAL | Status: DC | PRN
  Filled 2017-03-31: qty 5

## 2017-03-31 MED ORDER — ONDANSETRON HCL 4 MG/2ML IJ SOLN
4.0000 mg | Freq: Once | INTRAMUSCULAR | Status: DC | PRN
  Filled 2017-03-31: qty 2

## 2017-03-31 MED ORDER — LIDOCAINE 2% (20 MG/ML) 5 ML SYRINGE
INTRAMUSCULAR | Status: AC
  Filled 2017-03-31: qty 5

## 2017-03-31 MED ORDER — MIDAZOLAM HCL 5 MG/5ML IJ SOLN
INTRAMUSCULAR | Status: DC | PRN
  Administered 2017-03-31 (×2): 1 mg via INTRAVENOUS

## 2017-03-31 MED ORDER — OXYCODONE HCL 5 MG PO TABS
5.0000 mg | ORAL_TABLET | Freq: Once | ORAL | Status: DC | PRN
  Filled 2017-03-31: qty 1

## 2017-03-31 MED ORDER — FENTANYL CITRATE (PF) 250 MCG/5ML IJ SOLN
INTRAMUSCULAR | Status: DC | PRN
  Administered 2017-03-31: 25 ug via INTRAVENOUS

## 2017-03-31 MED ORDER — LIDOCAINE 2% (20 MG/ML) 5 ML SYRINGE
INTRAMUSCULAR | Status: DC | PRN
  Administered 2017-03-31: 50 mg via INTRAVENOUS

## 2017-03-31 MED ORDER — FENTANYL CITRATE (PF) 100 MCG/2ML IJ SOLN
25.0000 ug | INTRAMUSCULAR | Status: DC | PRN
  Filled 2017-03-31: qty 1

## 2017-03-31 MED ORDER — CLINDAMYCIN PHOSPHATE 600 MG/50ML IV SOLN
INTRAVENOUS | Status: AC
  Filled 2017-03-31: qty 50

## 2017-03-31 MED ORDER — SODIUM CHLORIDE 0.9 % IV SOLN
INTRAVENOUS | Status: DC
  Administered 2017-03-31: 06:00:00 via INTRAVENOUS
  Filled 2017-03-31: qty 1000

## 2017-03-31 MED ORDER — BUPIVACAINE-EPINEPHRINE 0.5% -1:200000 IJ SOLN
INTRAMUSCULAR | Status: DC | PRN
  Administered 2017-03-31: 30 mL

## 2017-03-31 MED ORDER — PROPOFOL 500 MG/50ML IV EMUL
INTRAVENOUS | Status: AC
  Filled 2017-03-31: qty 50

## 2017-03-31 MED ORDER — 0.9 % SODIUM CHLORIDE (POUR BTL) OPTIME
TOPICAL | Status: DC | PRN
  Administered 2017-03-31: 500 mL

## 2017-03-31 MED ORDER — LACTATED RINGERS IV SOLN
INTRAVENOUS | Status: DC | PRN
  Administered 2017-03-31: 07:00:00 via INTRAVENOUS

## 2017-03-31 SURGICAL SUPPLY — 53 items
ADH SKN CLS APL DERMABOND .7 (GAUZE/BANDAGES/DRESSINGS) ×1
APL SKNCLS STERI-STRIP NONHPOA (GAUZE/BANDAGES/DRESSINGS) ×1
BENZOIN TINCTURE PRP APPL 2/3 (GAUZE/BANDAGES/DRESSINGS) ×3 IMPLANT
BLADE HEX COATED 2.75 (ELECTRODE) ×3 IMPLANT
BLADE SURG 10 STRL SS (BLADE) IMPLANT
BLADE SURG 15 STRL LF DISP TIS (BLADE) ×1 IMPLANT
BLADE SURG 15 STRL SS (BLADE) ×3
BRIEF STRETCH FOR OB PAD LRG (UNDERPADS AND DIAPERS) ×3 IMPLANT
CANISTER SUCT 3000ML PPV (MISCELLANEOUS) ×3 IMPLANT
COVER BACK TABLE 60X90IN (DRAPES) ×3 IMPLANT
COVER MAYO STAND STRL (DRAPES) ×3 IMPLANT
DECANTER SPIKE VIAL GLASS SM (MISCELLANEOUS) ×3 IMPLANT
DERMABOND ADVANCED (GAUZE/BANDAGES/DRESSINGS) ×2
DERMABOND ADVANCED .7 DNX12 (GAUZE/BANDAGES/DRESSINGS) ×1 IMPLANT
DRAIN PENROSE 18X1/4 LTX STRL (WOUND CARE) ×3 IMPLANT
DRAPE LAPAROTOMY 100X72 PEDS (DRAPES) ×3 IMPLANT
DRAPE LG THREE QUARTER DISP (DRAPES) ×2 IMPLANT
DRAPE UTILITY XL STRL (DRAPES) ×3 IMPLANT
ELECT BLADE 6.5 .24CM SHAFT (ELECTRODE) IMPLANT
ELECT REM PT RETURN 9FT ADLT (ELECTROSURGICAL) ×3
ELECTRODE REM PT RTRN 9FT ADLT (ELECTROSURGICAL) ×1 IMPLANT
GAUZE SPONGE 4X4 12PLY STRL LF (GAUZE/BANDAGES/DRESSINGS) ×2 IMPLANT
GAUZE SPONGE 4X4 16PLY NS LF (WOUND CARE) IMPLANT
GAUZE SPONGE 4X4 16PLY XRAY LF (GAUZE/BANDAGES/DRESSINGS) IMPLANT
GAUZE VASELINE 3X9 (GAUZE/BANDAGES/DRESSINGS) IMPLANT
GLOVE BIO SURGEON STRL SZ 6.5 (GLOVE) ×3 IMPLANT
GLOVE BIO SURGEONS STRL SZ 6.5 (GLOVE) ×2
GLOVE INDICATOR 7.0 STRL GRN (GLOVE) ×5 IMPLANT
GOWN STRL REUS W/ TWL LRG LVL3 (GOWN DISPOSABLE) ×1 IMPLANT
GOWN STRL REUS W/TWL 2XL LVL3 (GOWN DISPOSABLE) ×3 IMPLANT
GOWN STRL REUS W/TWL LRG LVL3 (GOWN DISPOSABLE) ×3
KIT RM TURNOVER CYSTO AR (KITS) ×3 IMPLANT
NDL SAFETY ECLIPSE 18X1.5 (NEEDLE) IMPLANT
NEEDLE HYPO 18GX1.5 SHARP (NEEDLE)
NEEDLE HYPO 22GX1.5 SAFETY (NEEDLE) ×3 IMPLANT
NS IRRIG 500ML POUR BTL (IV SOLUTION) ×3 IMPLANT
PACK BASIN DAY SURGERY FS (CUSTOM PROCEDURE TRAY) ×3 IMPLANT
PAD ABD 8X10 STRL (GAUZE/BANDAGES/DRESSINGS) ×3 IMPLANT
PAD ARMBOARD 7.5X6 YLW CONV (MISCELLANEOUS) ×3 IMPLANT
PENCIL BUTTON HOLSTER BLD 10FT (ELECTRODE) ×3 IMPLANT
SPONGE LAP 18X18 X RAY DECT (DISPOSABLE) ×2 IMPLANT
SPONGE LAP 4X18 X RAY DECT (DISPOSABLE) IMPLANT
SPONGE SURGIFOAM ABS GEL 12-7 (HEMOSTASIS) IMPLANT
SUT ETHILON 2 0 PS N (SUTURE) ×3 IMPLANT
SUT VIC AB 2-0 SH 18 (SUTURE) ×5 IMPLANT
SUT VIC AB 3-0 SH 18 (SUTURE) ×3 IMPLANT
SYR BULB IRRIGATION 50ML (SYRINGE) ×3 IMPLANT
SYR CONTROL 10ML LL (SYRINGE) ×3 IMPLANT
TOWEL OR 17X24 6PK STRL BLUE (TOWEL DISPOSABLE) ×6 IMPLANT
TRAY DSU PREP LF (CUSTOM PROCEDURE TRAY) ×3 IMPLANT
TUBE CONNECTING 12'X1/4 (SUCTIONS) ×1
TUBE CONNECTING 12X1/4 (SUCTIONS) ×2 IMPLANT
YANKAUER SUCT BULB TIP NO VENT (SUCTIONS) ×3 IMPLANT

## 2017-03-31 NOTE — Anesthesia Procedure Notes (Signed)
Procedure Name: MAC Date/Time: 03/31/2017 7:43 AM Performed by: Bethena Roys T Pre-anesthesia Checklist: Patient identified, Timeout performed, Emergency Drugs available, Suction available and Patient being monitored Patient Re-evaluated:Patient Re-evaluated prior to induction Oxygen Delivery Method: Nasal cannula Placement Confirmation: positive ETCO2

## 2017-03-31 NOTE — Op Note (Signed)
03/31/2017  8:37 AM  PATIENT:  Maurice Munoz  71 y.o. male  Patient Care Team: Redmond School, MD as PCP - General (Internal Medicine) Danie Binder, MD as Consulting Physician (Gastroenterology)  PRE-OPERATIVE DIAGNOSIS:  pilonidal disease  POST-OPERATIVE DIAGNOSIS:  pilonidal disease  PROCEDURE:  Procedure(s): EXCISION OF PILONIDAL DISEASE WITH ROTATIONAL FLAP CLOSURE  SURGEON:  Surgeon(s): Leighton Ruff, MD  ASSISTANT: none   ANESTHESIA:   local and MAC  EBL:  Total I/O In: -  Out: 20 [Blood:20]  DRAINS: penrose in wound bed   SPECIMEN:  Source of Specimen:  chronic pilonidal wound  DISPOSITION OF SPECIMEN:  PATHOLOGY  COUNTS:  YES  PLAN OF CARE: Discharge to home after PACU  PATIENT DISPOSITION:  PACU - hemodynamically stable.  INDICATION: 71 y.o. M with chronic wound after pilonidal surgery 40 yrs ago.     OR FINDINGS: Chronic appearing wound with no scarring noted.   DESCRIPTION: the patient was identified in the preoperative holding area and taken to the OR where they were laid prone on the operating room table.  MAC anesthesia was induced without difficulty. SCDs were also noted to be in place prior to the initiation of anesthesia.  The patient was then prepped and draped in the usual sterile fashion.   A surgical timeout was performed indicating the correct patient, procedure, positioning and need for preoperative antibiotics.   I began by placing a field block using Marcaine with epinephrine. After this was completed, I excised the wound in a triangular pattern using a 15 blade scalpel. I excised the scar posterior to this as well. This was sent to pathology for further examination. I then made a curvilinear lateral incision at the posterior aspect of the wound and continue this through the subcutaneous tissues I was able to rotate a flap of skin and subcutaneous fat into the wound bed after mobilization. Hemostasis was achieved using electrocautery.  The dermal layer was then closed with interrupted 2-0 Vicryl suture. A Penrose drain was placed in the base of the wound bed and brought out laterally. 2-0 nylon mattress sutures were used to close the skin. A nylon suture was also used to secure the Penrose drain. A sterile dressing was applied. The flap was well perfused at the end of the procedure. All counts were correct per operating room staff. The patient was awakened from anesthesia and sent to the post anesthesia care unit in stable condition.

## 2017-03-31 NOTE — Transfer of Care (Signed)
Immediate Anesthesia Transfer of Care Note  Patient: Maurice Munoz  Procedure(s) Performed: EXCISION OF PILONIDAL DISEASE (N/A Coccyx)  Patient Location: PACU  Anesthesia Type:MAC  Level of Consciousness: drowsy  Airway & Oxygen Therapy: Patient Spontanous Breathing and Patient connected to nasal cannula oxygen  Post-op Assessment: Report given to RN and Post -op Vital signs reviewed and stable  Post vital signs: Reviewed and stable  Last Vitals: 126/58, 60, 8, 99%, 98.0  Vitals:   03/31/17 0559  BP: (!) 106/54  Pulse: 65  Resp: 18  Temp: 36.9 C  SpO2: 97%    Last Pain:  Vitals:   03/31/17 0559  TempSrc: Oral      Patients Stated Pain Goal: 8 (19/62/22 9798)  Complications: No apparent anesthesia complications

## 2017-03-31 NOTE — Discharge Instructions (Addendum)
GENERAL SURGERY: POST OP INSTRUCTIONS  1. DIET: Follow a light bland diet the first 24 hours after arrival home, such as soup, liquids, crackers, etc.  Be sure to include lots of fluids daily.  Avoid fast food or heavy meals as your are more likely to get nauseated.   2. Take your usually prescribed home medications unless otherwise directed. 3. PAIN CONTROL: a. Pain is best controlled by a usual combination of three different methods TOGETHER: i. Ice/Heat ii. Over the counter pain medication iii. Prescription pain medication b. Most patients will experience some swelling and bruising around the incisions.  Ice packs or heating pads (30-60 minutes up to 6 times a day) will help. Use ice for the first few days to help decrease swelling and bruising, then switch to heat to help relax tight/sore spots and speed recovery.  Some people prefer to use ice alone, heat alone, alternating between ice & heat.  Experiment to what works for you.  Swelling and bruising can take several weeks to resolve.    c. A  prescription for pain medication (Use your current prescription for pain relief).  Take your pain medication as prescribed.  i. If you are having problems/concerns with the prescription medicine (does not control pain, nausea, vomiting, rash, itching, etc), please call us [phone] to see if we need to switch you to a different pain medicine that will work better for you and/or control your side effect better. ii. If you need a refill on your pain medication, please contact your pharmacy.  They will contact our office to request authorization. Prescriptions will not be filled after 5 pm or on week-ends. 4. Avoid getting constipated.  Between the surgery and the pain medications, it is common to experience some constipation.  Increasing fluid intake and taking a fiber supplement (such as Metamucil, Citrucel, FiberCon, MiraLax, etc) 1-2 times a day regularly will usually help prevent this problem from  occurring.  A mild laxative (prune juice, Milk of Magnesia, MiraLax, etc) should be taken according to package directions if there are no bowel movements after 48 hours.   5. Wash / shower every day.  You may shower over the dressings as they are waterproof.  Continue to shower over incision(s) after the dressing is off. 6. Keep incision covered with a dry dressing. Change once a day and as needed.  Cover the rubber tube in the dressing.  7. ACTIVITIES as tolerated:   a. You may resume regular (light) daily activities beginning the next day--such as daily self-care, walking, climbing stairs--gradually increasing activities as tolerated.  If you can walk 30 minutes without difficulty, it is safe to try more intense activity such as jogging, treadmill, bicycling, low-impact aerobics, swimming, etc. b. Save the most intensive and strenuous activity for last such as sit-ups, heavy lifting, contact sports, etc  Refrain from any heavy lifting or straining until you are off narcotics for pain control.   c. DO NOT PUSH THROUGH PAIN.  Let pain be your guide: If it hurts to do something, don't do it.  Pain is your body warning you to avoid that activity for another week until the pain goes down. d. You may drive when you are no longer taking prescription pain medication, you can comfortably wear a seatbelt, and you can safely maneuver your car and apply brakes. e. Dennis Bast may have sexual intercourse when it is comfortable.  8. FOLLOW UP in our office a. Please call [facility] at [phone] to set up  an appointment to see your surgeon in the office for a follow-up appointment approximately 2-3 weeks after your surgery. b. Make sure that you call for this appointment the day you arrive home to insure a convenient appointment time. 9. IF YOU HAVE DISABILITY OR FAMILY LEAVE FORMS, BRING THEM TO THE OFFICE FOR PROCESSING.  DO NOT GIVE THEM TO YOUR DOCTOR.   WHEN TO CALL us [phone]: 1. Poor pain  control 2. Reactions / problems with new medications (rash/itching, nausea, etc)  3. Fever over 101.5 F (38.5 C) 4. Worsening swelling or bruising 5. Continued bleeding from incision. 6. Increased pain, redness, or drainage from the incision   The clinic staff is available to answer your questions during regular business hours (8:30am-5pm).  Please dont hesitate to call and ask to speak to one of our nurses for clinical concerns.   If you have a medical emergency, go to the nearest emergency room or call 911.  A surgeon from [facility] is always on call at the [facility], [place], [place], [place], [place]  [address] ? MAIN: [phone] ? TOLL FREE: [phone] ?  FAX [phone] [email or web address]    Post Anesthesia Home Care Instructions  Activity: Get plenty of rest for the remainder of the day. A responsible individual must stay with you for 24 hours following the procedure.  For the next 24 hours, DO NOT: -Drive a car -Paediatric nurse -Drink alcoholic beverages -Take any medication unless instructed by your physician -Make any legal decisions or sign important papers.  Meals: Start with liquid foods such as gelatin or soup. Progress to regular foods as tolerated. Avoid greasy, spicy, heavy foods. If nausea and/or vomiting occur, drink only clear liquids until the nausea and/or vomiting subsides. Call your physician if vomiting continues.  Special Instructions/Symptoms: Your throat may feel dry or sore from the anesthesia or the breathing tube placed in your throat during surgery. If this causes discomfort, gargle with warm salt water. The discomfort should disappear within 24 hours.  If you had a scopolamine patch placed behind your ear for the management of post- operative nausea and/or vomiting:  1. The medication in the patch is effective for 72 hours, after which it should be removed.  Wrap  patch in a tissue and discard in the trash. Wash hands thoroughly with soap and water. 2. You may remove the patch earlier than 72 hours if you experience unpleasant side effects which may include dry mouth, dizziness or visual disturbances. 3. Avoid touching the patch. Wash your hands with soap and water after contact with the patch.

## 2017-03-31 NOTE — Anesthesia Preprocedure Evaluation (Addendum)
Anesthesia Evaluation  Patient identified by MRN, date of birth, ID band Patient awake    Reviewed: Allergy & Precautions, NPO status , Patient's Chart, lab work & pertinent test results  Airway Mallampati: II  TM Distance: >3 FB Neck ROM: Full    Dental  (+) Edentulous Upper,    Pulmonary former smoker,    breath sounds clear to auscultation       Cardiovascular hypertension,  Rhythm:Regular Rate:Normal     Neuro/Psych    GI/Hepatic   Endo/Other    Renal/GU      Musculoskeletal   Abdominal (+) + obese,   Peds  Hematology   Anesthesia Other Findings   Reproductive/Obstetrics                            Anesthesia Physical Anesthesia Plan  ASA: III  Anesthesia Plan: MAC   Post-op Pain Management:    Induction: Intravenous  PONV Risk Score and Plan: Ondansetron  Airway Management Planned: Simple Face Mask and Natural Airway  Additional Equipment:   Intra-op Plan:   Post-operative Plan: Extubation in OR  Informed Consent: I have reviewed the patients History and Physical, chart, labs and discussed the procedure including the risks, benefits and alternatives for the proposed anesthesia with the patient or authorized representative who has indicated his/her understanding and acceptance.   Dental advisory given  Plan Discussed with: CRNA and Anesthesiologist  Anesthesia Plan Comments: (Pilonidal cyst CAD S/P PTCA with stents 1999, 2007 Low risk nuclear stress test 09/02/12 EF 65% Hypertension Gout Chronic low back pain Obesity)      Anesthesia Quick Evaluation

## 2017-03-31 NOTE — Anesthesia Postprocedure Evaluation (Signed)
Anesthesia Post Note  Patient: Maurice Munoz  Procedure(s) Performed: EXCISION OF PILONIDAL DISEASE (N/A Coccyx)     Patient location during evaluation: PACU Anesthesia Type: General Level of consciousness: awake, awake and alert and oriented Pain management: pain level controlled Vital Signs Assessment: post-procedure vital signs reviewed and stable Respiratory status: spontaneous breathing, nonlabored ventilation and respiratory function stable Cardiovascular status: blood pressure returned to baseline Anesthetic complications: no    Last Vitals:  Vitals:   03/31/17 0930 03/31/17 0945  BP: (!) 117/58 117/61  Pulse: (!) 54 (!) 55  Resp: 10 11  Temp:    SpO2: 100% 100%    Last Pain:  Vitals:   03/31/17 0559  TempSrc: Oral                 Jaishon Krisher COKER

## 2017-03-31 NOTE — H&P (Signed)
71 year old male who presents to the office for a pilonidal fistula. He states that is been present for couple years. He does have a history of pilonidal surgery approximately 40 years ago. He reports that it becomes painful and drains every few weeks. It opened up to a larger sore several months ago and he was referred to the wound clinic for evaluation. They treated the external lesion but he continues to have pain in the area. He is here today for evaluation. He takes prednisone occasionally for gout and back pain. He denies any smoking or history of diabetes.   Past Surgical History (Tanisha A. Owens Shark, [place]; 02/23/2017 10:03 AM) Spinal Surgery - Lower Back Spinal Surgery - Neck  Diagnostic Studies History (Tanisha A. Owens Shark, [place]; 02/23/2017 10:03 AM) Colonoscopy >10 years ago  Allergies (Tanisha A. Owens Shark, [place]; 02/23/2017 10:05 AM) Penicillins Motrin *ANALGESICS - ANTI-INFLAMMATORY* Allergies Reconciled  Medication History (Tanisha A. Owens Shark, [place]; 02/23/2017 10:06 AM) Metoprolol Tartrate (50MG  Tablet, Oral) Active. Lisinopril (5MG  Tablet, Oral) Active. PredniSONE (10MG  Tablet, Oral) Active. Simvastatin (20MG  Tablet, Oral) Active. Plavix (75MG  Tablet, Oral) Active. Allopurinol (300MG  Tablet, Oral) Active. Medications Reconciled  Social History (Tanisha A. Owens Shark, [place]; 02/23/2017 10:03 AM) Alcohol use Remotely quit alcohol use. Caffeine use Coffee. No drug use Tobacco use Former smoker.  Family History (Tanisha A. Owens Shark, [place]; 02/23/2017 10:03 AM) Diabetes Mellitus Brother, Sister. Heart Disease Brother. Respiratory Condition Father.  Other Problems (Tanisha A. Owens Shark, [place]; 02/23/2017 10:03 AM) Hypercholesterolemia     Review of Systems  General Not Present- Appetite Loss, Chills, Fatigue, Fever, Night Sweats, Weight Gain and Weight Loss. Skin Present- Non-Healing Wounds. Not Present- Change in Wart/Mole, Dryness, Hives, Jaundice, New Lesions, Rash  and Ulcer. HEENT Present- Nose Bleed, Sore Throat and Wears glasses/contact lenses. Not Present- Earache, Hearing Loss, Hoarseness, Oral Ulcers, Ringing in the Ears, Seasonal Allergies, Sinus Pain, Visual Disturbances and Yellow Eyes. Respiratory Present- Bloody sputum. Not Present- Chronic Cough, Difficulty Breathing, Snoring and Wheezing. Breast Not Present- Breast Mass, Breast Pain, Nipple Discharge and Skin Changes. Cardiovascular Present- Leg Cramps and Swelling of Extremities. Not Present- Chest Pain, Difficulty Breathing Lying Down, Palpitations, Rapid Heart Rate and Shortness of Breath. Gastrointestinal Present- Constipation. Not Present- Abdominal Pain, Bloating, Bloody Stool, Change in Bowel Habits, Chronic diarrhea, Difficulty Swallowing, Excessive gas, Gets full quickly at meals, Hemorrhoids, Indigestion, Nausea, Rectal Pain and Vomiting. Male Genitourinary Not Present- Blood in Urine, Change in Urinary Stream, Frequency, Impotence, Nocturia, Painful Urination, Urgency and Urine Leakage. Musculoskeletal Present- Back Pain. Not Present- Joint Pain, Joint Stiffness, Muscle Pain, Muscle Weakness and Swelling of Extremities. Neurological Not Present- Decreased Memory, Fainting, Headaches, Numbness, Seizures, Tingling, Tremor, Trouble walking and Weakness. Psychiatric Not Present- Anxiety, Bipolar, Change in Sleep Pattern, Depression, Fearful and Frequent crying. Endocrine Not Present- Cold Intolerance, Excessive Hunger, Hair Changes, Heat Intolerance, Hot flashes and New Diabetes. Hematology Present- Blood Thinners and Easy Bruising. Not Present- Excessive bleeding, Gland problems, HIV and Persistent Infections.  BP (!) 106/54   Pulse 65   Temp 98.5 F (36.9 C) (Oral)   Resp 18   Ht 6' (1.829 m)   Wt 116.6 kg (257 lb)   SpO2 97%   BMI 34.86 kg/m     Physical Exam   General Mental Status-Alert. General Appearance-Not in acute distress. Build & Nutrition-Well  nourished. Posture-Normal posture. Gait-Normal.  Head and Neck Head-normocephalic, atraumatic with no lesions or palpable masses. Trachea-midline.  Chest and Lung Exam Chest and lung exam reveals -on auscultation, normal breath sounds,  no adventitious sounds and normal vocal resonance.  Cardiovascular Cardiovascular examination reveals -normal heart sounds, regular rate and rhythm with no murmurs and no digital clubbing, cyanosis, edema, increased warmth or tenderness.  Abdomen Inspection Inspection of the abdomen reveals - No Hernias. Palpation/Percussion Palpation and Percussion of the abdomen reveal - Soft, Non Tender, No Rigidity (guarding), No hepatosplenomegaly and No Palpable abdominal masses.  Rectal Area of skin changes and small opening Posterior midline within the glucleft.  Neurologic Neurologic evaluation reveals -alert and oriented x 3 with no impairment of recent or remote memory, normal attention span and ability to concentrate, normal sensation and normal coordination.  Musculoskeletal Normal Exam - Bilateral-Upper Extremity Strength Normal and Lower Extremity Strength Normal.    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 On exam he has a pilonidal opening with significant skin changes. I suspect this fistula as his to one side or the other. I have recommended debridement and closure with possibly a rotational flap. I will most likely send the excisional area for biopsy.This was discussed with the patient in detail. We discussed that he has a pretty high likelihood of not healing the wound completely. He is willing to undergo the procedure and understands the risks involved. He does take Plavix on a daily basis but there is no need for him to stop this prior to surgery.

## 2017-04-01 ENCOUNTER — Encounter (HOSPITAL_BASED_OUTPATIENT_CLINIC_OR_DEPARTMENT_OTHER): Payer: Self-pay | Admitting: General Surgery

## 2017-04-06 ENCOUNTER — Ambulatory Visit (HOSPITAL_COMMUNITY)
Admission: RE | Admit: 2017-04-06 | Discharge: 2017-04-06 | Disposition: A | Discharge status: Home / Self Care | Payer: Medicare Other | Source: Ambulatory Visit | Attending: Pulmonary Disease | Admitting: Pulmonary Disease

## 2017-04-06 DIAGNOSIS — I2510 Atherosclerotic heart disease of native coronary artery without angina pectoris: Secondary | ICD-10-CM | POA: Insufficient documentation

## 2017-04-06 DIAGNOSIS — D3502 Benign neoplasm of left adrenal gland: Secondary | ICD-10-CM | POA: Insufficient documentation

## 2017-04-06 DIAGNOSIS — J90 Pleural effusion, not elsewhere classified: Secondary | ICD-10-CM | POA: Diagnosis not present

## 2017-04-06 DIAGNOSIS — J8410 Pulmonary fibrosis, unspecified: Secondary | ICD-10-CM | POA: Diagnosis not present

## 2017-04-06 DIAGNOSIS — I700 Atherosclerosis of aorta: Secondary | ICD-10-CM | POA: Insufficient documentation

## 2017-04-06 DIAGNOSIS — N200 Calculus of kidney: Secondary | ICD-10-CM | POA: Diagnosis not present

## 2017-04-25 ENCOUNTER — Other Ambulatory Visit (INDEPENDENT_AMBULATORY_CARE_PROVIDER_SITE_OTHER): Payer: Medicare Other

## 2017-04-25 ENCOUNTER — Ambulatory Visit: Payer: Medicare Other | Admitting: Pulmonary Disease

## 2017-04-25 ENCOUNTER — Encounter: Payer: Self-pay | Admitting: Pulmonary Disease

## 2017-04-25 VITALS — BP 128/70 | HR 76 | Ht 72.0 in | Wt 255.0 lb

## 2017-04-25 DIAGNOSIS — R0600 Dyspnea, unspecified: Secondary | ICD-10-CM | POA: Diagnosis not present

## 2017-04-25 DIAGNOSIS — J849 Interstitial pulmonary disease, unspecified: Secondary | ICD-10-CM

## 2017-04-25 LAB — SEDIMENTATION RATE: SED RATE: 43 mm/h — AB (ref 0–20)

## 2017-04-25 LAB — C-REACTIVE PROTEIN: CRP: 1.7 mg/dL (ref 0.5–20.0)

## 2017-04-25 NOTE — Patient Instructions (Signed)
Interstitial lung disease: At this time it is not clear to me what is causing this, I suspect it is related to your prior occupational exposures We will arrange for a lung function test, blood work to look for an underlying cause, and a 6-minute walk test We will bring you back in about 2 weeks to go over these results  Shortness of breath: While I believe this is related to the lung problem, I think that getting out of shape contributes as well. We will make a referral to the pulmonary rehab group at [facility]  We will see you back in about 2 weeks

## 2017-04-25 NOTE — Progress Notes (Signed)
Subjective:    Patient ID: Maurice Munoz, male    DOB: [birth year], 71 y.o.   MRN: [id number]  Synopsis: Referred in 2018 for pulmonary fibrosis  HPI Chief Complaint  Patient presents with  . Advice Only    Referred by Dr. Luan Pulling for pulmonary fibrosis.    Mr. Closs is here for me to evaluate him for pulmonary fibrosis.   He says that he has some dyspnea: > when he walks between his house and shop in the back of the house, about 100 yards, he has to stop and sit down after walking that far > he attributes this to his lack of exercise > he says that when he sings he feels less breath than before > this has slightly worsened in the last few months.   > he says that they first noticed trouble breathing  > he thinks this started around 08/2015 after he had lost about 40 pounds > he had his blood pressure medicines adjusted around this time by nephrology  He has been dealing with a lot of trouble with a pilonidal cyst in the last few weeks.    He has CAD  > follows with Dr. Claiborne Billings > has two stents  He worked in the [employer] around a lot of dust.  He used to smoke, quit 30 years ago.  He smoked 2-3 packs of cigarettes a day for about 20 - 25 years.  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  He was working with with steel, never worked with Chartered loss adjuster.  Sometimes while welding he would taste chemicals in his mouth.  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  He worked there for 4-5 years.  He has made knives, worked on tractors.   Past Medical History:  Diagnosis Date  . Bilateral carotid artery stenosis followed by dr Claiborne Billings   per last carotid duplex [id number]  -- proximal LICA 56-21% ,  RICA 3-08%  . CAD (coronary artery disease) cardiologist-  dr t. Claiborne Billings   hx PTCA in 1999 to RCA & PDA/  10-11-2005  DES x2 to [place]  . Chronic constipation   . Chronic pain syndrome     . CKD (chronic kidney disease), stage III ([place])   . DDD (degenerative disc disease), cervical   . DDD (degenerative disc disease), lumbosacral   . Dyspnea on exertion   . Gout    per pt last inflared episode 2015 approx.  . H/O epistaxis    per pt sees ENT dr Benjamine Mola for cauterization (pt takes plavix)  . History of acute pancreatitis 08/08/2014  . History of kidney stones   . HTN (hypertension)   . Hyperlipidemia   . Nocturia   . Numbness of right foot    due to DDD lumbar  . Peripheral edema   . Pilonidal disease   . Pulmonary fibrosis ([place])    followed by pcp-- last chest CT 04-08-2016  . S/P drug eluting coronary stent placement 10/11/2005   mLAD and dRCA  . Wears dentures    upper  . Wears glasses      Family History  Problem Relation Age of Onset  . Cancer Father   . Heart attack Brother   . Diabetes Brother   . Colon cancer Neg Hx      Social History   Socioeconomic History  . Marital status: Married  Spouse name: Not on file  . Number of children: Not on file  . Years of education: Not on file  . Highest education level: Not on file  Social Needs  . Financial resource strain: Not on file  . Food insecurity - worry: Not on file  . Food insecurity - inability: Not on file  . Transportation needs - medical: Not on file  . Transportation needs - non-medical: Not on file  Occupational History  . Not on file  Tobacco Use  . Smoking status: Former Smoker    Packs/day: 3.00    Years: 25.00    Pack years: 75.00    Types: Cigarettes    Last attempt to quit: 11/27/1988    Years since quitting: 28.4  . Smokeless tobacco: Former Systems developer    Quit date: 03/28/1989  Substance and Sexual Activity  . Alcohol use: No  . Drug use: No  . Sexual activity: Not on file  Other Topics Concern  . Not on file  Social History Narrative   Previously used used to work on a farm   Currently retired secondary to chronic back pains-   Used to be a Pension scheme manager   Married to his wife and lives in [place]   Father died at age 41 lung cancer   Mother had CAD   Wife had cancer     Allergies  Allergen Reactions  . Ibuprofen Itching    Motrin brand  . Neosporin [Neomycin-Bacitracin Zn-Polymyx] Swelling  . Penicillins Rash          Outpatient Medications Prior to Visit  Medication Sig Dispense Refill  . allopurinol (ZYLOPRIM) 300 MG tablet Take 300 mg by mouth every morning.     Marland Kitchen aspirin EC 81 MG tablet Take 81 mg by mouth daily.    . cholecalciferol (VITAMIN D) 1000 units tablet Take 1,000 Units by mouth every morning.     . clopidogrel (PLAVIX) 75 MG tablet TAKE ONE (1) TABLET BY MOUTH EVERY DAY (Patient taking differently: TAKE ONE (1) TABLET BY MOUTH EVERY DAY--- per pt takes every other day (per pt "my doctor knows") 90 tablet 3  . docusate sodium (COLACE) 100 MG capsule Take 100 mg by mouth at bedtime.    Marland Kitchen lisinopril (PRINIVIL,ZESTRIL) 5 MG tablet Take 5 mg by mouth every evening.     . metoprolol tartrate (LOPRESSOR) 50 MG tablet TAKE ONE TABLET BY MOUTH TWICE A DAY 180 tablet 0  . oxyCODONE-acetaminophen (PERCOCET) 10-325 MG per tablet Take 1 tablet by mouth every 8 (eight) hours as needed for pain.     . predniSONE (DELTASONE) 10 MG tablet Take 4 tabs daily for 6 more days then resume home regimen. (Patient taking differently: Take 10 mg by mouth as needed. Take 4 tabs daily for 6 more days then resume home regimen.prn) 24 tablet o  . simvastatin (ZOCOR) 20 MG tablet TAKE ONE (1) TABLET AT BEDTIME 90 tablet 1  . sodium phosphate (FLEET) 7-19 GM/118ML ENEM Place 1 enema rectally daily as needed for severe constipation.    Marland Kitchen tiZANidine (ZANAFLEX) 4 MG tablet Take 4 mg by mouth at bedtime.     . torsemide (DEMADEX) 20 MG tablet TAKE ONE (1) TABLET BY MOUTH EVERY DAY (Patient taking differently: TAKE ONE (1) TABLET BY MOUTH EVERY DAY-- per pt takes as needed) 30 tablet 10   No facility-administered medications prior to visit.        Review of Systems  Constitutional: Positive for fatigue. Negative for fever and unexpected weight change.  HENT: Negative for congestion, dental problem, ear pain, nosebleeds, postnasal drip, rhinorrhea, sinus pressure, sneezing, sore throat and trouble swallowing.   Eyes: Negative for redness and itching.  Respiratory: Positive for shortness of breath. Negative for cough, chest tightness and wheezing.   Cardiovascular: Negative for palpitations and leg swelling.  Gastrointestinal: Negative for nausea and vomiting.  Genitourinary: Negative for dysuria.  Musculoskeletal: Negative for joint swelling.  Skin: Negative for rash.  Neurological: Negative for headaches.  Hematological: Does not bruise/bleed easily.  Psychiatric/Behavioral: Negative for dysphoric mood. The patient is not nervous/anxious.        Objective:   Physical Exam Vitals:   04/25/17 1338  BP: 128/70  Pulse: 76  SpO2: 96%  Weight: 255 lb (115.7 kg)  Height: 6' (1.829 m)   Gen: overweight but well appearing, no acute distress HENT: NCAT, OP clear, neck supple without masses Eyes: PERRL, EOMi Lymph: no cervical lymphadenopathy PULM: CTA B CV: RRR, no mgr, no JVD GI: BS+, soft, nontender, no hsm Derm: no rash or skin breakdown MSK: normal bulk and tone Neuro: A&Ox4, CN II-XII intact, strength 5/5 in all 4 extremities Psyche: normal mood and affect   CBC    Component Value Date/Time   WBC 8.4 09/12/2015 1531   RBC 5.24 09/12/2015 1531   HGB 14.3 03/31/2017 0600   HCT 42.0 03/31/2017 0600   PLT 199 09/12/2015 1531   MCV 85.7 09/12/2015 1531   MCH 28.4 09/12/2015 1531   MCHC 33.2 09/12/2015 1531   RDW 13.6 09/12/2015 1531   LYMPHSABS 1.5 08/08/2014 0554   MONOABS 0.8 08/08/2014 0554   EOSABS 0.1 08/08/2014 0554   BASOSABS 0.0 08/08/2014 0554   January 2018 cardiology records reviewed where he was cared for for hypertension hyperlipidemia and coronary artery disease October 2018 surgery  records reviewed, he had a pilonidal cyst removed  Chest imaging: October 2018 chest CT images independently reviewed showing nonspecific intralobular septal thickening but no clear honeycombing, no clear cranial cardio gradient       Assessment & Plan:   ILD (interstitial lung disease) ([place]) - Plan: ANA, C-reactive protein, Sedimentation rate, Rheumatoid factor, Anti-Jo 1 antibody, IgG, Aldolase, Centromere Antibodies, Anti-scleroderma antibody, Sjogrens syndrome-A extractable nuclear antibody, Sjogrens syndrome-B extractable nuclear antibody, Hypersensitivity pnuemonitis profile, Pulmonary function test, AMB referral to pulmonary rehabilitation  Dyspnea, unspecified type  Discussion: [place] presents today with interstitial lung disease.  Based on my review of the CT scan of his chest it is not clear what is causing this.  There is not a specific pattern such as UIP, but there is definite fibrotic changes.  I think that his occupational exposure is likely at play, though the differential diagnosis is broad and includes nonspecific interstitial pneumonitis, less likely sarcoid or other causes.  Moving forward I think we need to get serology to look for underlying connective tissue disease, a lung function test to quantify the severity of the fibrosis, and a 6-minute walk test.  He is deconditioned and would benefit from pulmonary rehab.  Plan: Interstitial lung disease: At this time it is not clear to me what is causing this, I suspect it is related to your prior occupational exposures We will arrange for a lung function test, blood work to look for an underlying cause, and a 6-minute walk test We will bring you back in about 2 weeks to go over these results  Shortness of breath: While I believe this  is related to the lung problem, I think that getting out of shape contributes as well. We will make a referral to [facility] pulmonary [facility] at [facility]  We will see you back in about 2  weeks    Current Outpatient Medications:  .  allopurinol (ZYLOPRIM) 300 MG tablet, Take 300 mg by mouth every morning. , Disp: , Rfl:  .  aspirin EC 81 MG tablet, Take 81 mg by mouth daily., Disp: , Rfl:  .  cholecalciferol (VITAMIN D) 1000 units tablet, Take 1,000 Units by mouth every morning. , Disp: , Rfl:  .  clopidogrel (PLAVIX) 75 MG tablet, TAKE ONE (1) TABLET BY MOUTH EVERY DAY (Patient taking differently: TAKE ONE (1) TABLET BY MOUTH EVERY DAY--- per pt takes every other day (per pt "my doctor knows"), Disp: 90 tablet, Rfl: 3 .  docusate sodium (COLACE) 100 MG capsule, Take 100 mg by mouth at bedtime., Disp: , Rfl:  .  lisinopril (PRINIVIL,ZESTRIL) 5 MG tablet, Take 5 mg by mouth every evening. , Disp: , Rfl:  .  metoprolol tartrate (LOPRESSOR) 50 MG tablet, TAKE ONE TABLET BY MOUTH TWICE A DAY, Disp: 180 tablet, Rfl: 0 .  oxyCODONE-acetaminophen (PERCOCET) 10-325 MG per tablet, Take 1 tablet by mouth every 8 (eight) hours as needed for pain. , Disp: , Rfl:  .  predniSONE (DELTASONE) 10 MG tablet, Take 4 tabs daily for 6 more days then resume home regimen. (Patient taking differently: Take 10 mg by mouth as needed. Take 4 tabs daily for 6 more days then resume home regimen.prn), Disp: 24 tablet, Rfl: o .  simvastatin (ZOCOR) 20 MG tablet, TAKE ONE (1) TABLET AT BEDTIME, Disp: 90 tablet, Rfl: 1 .  sodium phosphate (FLEET) 7-19 GM/118ML ENEM, Place 1 enema rectally daily as needed for severe constipation., Disp: , Rfl:  .  tiZANidine (ZANAFLEX) 4 MG tablet, Take 4 mg by mouth at bedtime. , Disp: , Rfl:  .  torsemide (DEMADEX) 20 MG tablet, TAKE ONE (1) TABLET BY MOUTH EVERY DAY (Patient taking differently: TAKE ONE (1) TABLET BY MOUTH EVERY DAY-- per pt takes as needed), Disp: 30 tablet, Rfl: 10

## 2017-04-26 LAB — CENTROMERE ANTIBODIES: Centromere Ab Screen: <1 AI

## 2017-04-26 LAB — ANTI-JO 1 ANTIBODY, IGG

## 2017-04-27 ENCOUNTER — Telehealth: Payer: Self-pay | Admitting: Orthopedic Surgery

## 2017-04-27 ENCOUNTER — Emergency Department (HOSPITAL_COMMUNITY): Payer: Medicare Other

## 2017-04-27 ENCOUNTER — Other Ambulatory Visit: Payer: Self-pay

## 2017-04-27 ENCOUNTER — Emergency Department (HOSPITAL_COMMUNITY)
Admission: EM | Admit: 2017-04-27 | Discharge: 2017-04-27 | Disposition: A | Discharge status: Home / Self Care | Payer: Medicare Other | Attending: Emergency Medicine | Admitting: Emergency Medicine

## 2017-04-27 ENCOUNTER — Encounter (HOSPITAL_COMMUNITY): Payer: Self-pay | Admitting: Emergency Medicine

## 2017-04-27 DIAGNOSIS — I2510 Atherosclerotic heart disease of native coronary artery without angina pectoris: Secondary | ICD-10-CM | POA: Diagnosis not present

## 2017-04-27 DIAGNOSIS — Z7902 Long term (current) use of antithrombotics/antiplatelets: Secondary | ICD-10-CM | POA: Insufficient documentation

## 2017-04-27 DIAGNOSIS — S4992XA Unspecified injury of left shoulder and upper arm, initial encounter: Secondary | ICD-10-CM | POA: Diagnosis not present

## 2017-04-27 DIAGNOSIS — Y9301 Activity, walking, marching and hiking: Secondary | ICD-10-CM | POA: Insufficient documentation

## 2017-04-27 DIAGNOSIS — Z87891 Personal history of nicotine dependence: Secondary | ICD-10-CM | POA: Insufficient documentation

## 2017-04-27 DIAGNOSIS — Z955 Presence of coronary angioplasty implant and graft: Secondary | ICD-10-CM | POA: Diagnosis not present

## 2017-04-27 DIAGNOSIS — Y929 Unspecified place or not applicable: Secondary | ICD-10-CM | POA: Diagnosis not present

## 2017-04-27 DIAGNOSIS — N183 Chronic kidney disease, stage 3 (moderate): Secondary | ICD-10-CM | POA: Insufficient documentation

## 2017-04-27 DIAGNOSIS — Y999 Unspecified external cause status: Secondary | ICD-10-CM | POA: Diagnosis not present

## 2017-04-27 DIAGNOSIS — S40012A Contusion of left shoulder, initial encounter: Secondary | ICD-10-CM | POA: Diagnosis not present

## 2017-04-27 DIAGNOSIS — W19XXXA Unspecified fall, initial encounter: Secondary | ICD-10-CM

## 2017-04-27 DIAGNOSIS — Z7982 Long term (current) use of aspirin: Secondary | ICD-10-CM | POA: Insufficient documentation

## 2017-04-27 DIAGNOSIS — M25512 Pain in left shoulder: Secondary | ICD-10-CM | POA: Diagnosis not present

## 2017-04-27 DIAGNOSIS — W0110XA Fall on same level from slipping, tripping and stumbling with subsequent striking against unspecified object, initial encounter: Secondary | ICD-10-CM | POA: Diagnosis not present

## 2017-04-27 DIAGNOSIS — Z79899 Other long term (current) drug therapy: Secondary | ICD-10-CM | POA: Diagnosis not present

## 2017-04-27 DIAGNOSIS — I129 Hypertensive chronic kidney disease with stage 1 through stage 4 chronic kidney disease, or unspecified chronic kidney disease: Secondary | ICD-10-CM | POA: Diagnosis not present

## 2017-04-27 DIAGNOSIS — S40912A Unspecified superficial injury of left shoulder, initial encounter: Secondary | ICD-10-CM | POA: Diagnosis not present

## 2017-04-27 HISTORY — DX: Dorsalgia, unspecified: M54.9

## 2017-04-27 MED ORDER — ONDANSETRON 8 MG PO TBDP
8.0000 mg | ORAL_TABLET | Freq: Once | ORAL | Status: AC
  Administered 2017-04-27: 8 mg via ORAL
  Filled 2017-04-27: qty 1

## 2017-04-27 MED ORDER — HYDROMORPHONE HCL 2 MG/ML IJ SOLN
2.0000 mg | Freq: Once | INTRAMUSCULAR | Status: AC
  Administered 2017-04-27: 2 mg via INTRAMUSCULAR
  Filled 2017-04-27: qty 1

## 2017-04-27 NOTE — Telephone Encounter (Signed)
Patient and wife called relaying that he had a fall last night ("out the back door") and is inquiring as to whether we have an available appointment this evening. Relayed that Dr Aline Brochure is finishing up and will be in surgery tomorrow and both providers will out of office until Tuesday of next week. Advised of protocol to go to Emergency room for evaluation and work-up; states will do, and will call back, depending on findings - will try back if needs to set up appointment.

## 2017-04-27 NOTE — Discharge Instructions (Signed)
Wear the shoulder immobilizer, as needed to help your discomfort.  Use ice on the sore area 3 or 4 times a day for 3 days after that, use heat.

## 2017-04-27 NOTE — ED Provider Notes (Signed)
[facility] EMERGENCY DEPARTMENT Provider Note   CSN: [id number] Arrival date & time: 04/27/17  1635     History   Chief Complaint Chief Complaint  Patient presents with  . Fall    HPI Maurice Munoz is a 71 y.o. male.  Patient presents for evaluation of left shoulder pain which occurred after a fall last night.  Today he has been using Percocet, without relief of his pain.  He is recovering from a surgery on his "tailbone."  The surgery was done 3 weeks ago when he states "I have a hole there."  He has been taking Percocet daily since his back surgery.  He states he fell because he was having a spasm in his back.  Primary complaint today is left shoulder pain.  He denies paresthesia of the left arm or hand.  No prior injury to the left shoulder.  He denies injury to head or neck.  He has not been vomiting today.  There are no other known modifying factors.  HPI  Past Medical History:  Diagnosis Date  . Back pain   . Bilateral carotid artery stenosis followed by dr Claiborne Billings   per last carotid duplex [id number]  -- proximal LICA 29-93% ,  RICA 7-16%  . CAD (coronary artery disease) cardiologist-  dr t. Claiborne Billings   hx PTCA in 1999 to RCA & PDA/  10-11-2005  DES x2 to [place]  . Chronic constipation   . Chronic pain syndrome   . CKD (chronic kidney disease), stage III ([place])   . DDD (degenerative disc disease), cervical   . DDD (degenerative disc disease), lumbosacral   . Dyspnea on exertion   . Gout    per pt last inflared episode 2015 approx.  . H/O epistaxis    per pt sees ENT dr Benjamine Mola for cauterization (pt takes plavix)  . History of acute pancreatitis 08/08/2014  . History of kidney stones   . HTN (hypertension)   . Hyperlipidemia   . Nocturia   . Numbness of right foot    due to DDD lumbar  . Peripheral edema   . Pilonidal disease   . Pulmonary fibrosis ([place])    followed by pcp-- last chest CT 04-08-2016  . S/P drug eluting coronary stent placement 10/11/2005   mLAD and dRCA  . Wears dentures    upper  . Wears glasses     Patient Active Problem List   Diagnosis Date Noted  . Hyperlipidemia LDL goal <70 09/25/2014  . Lower extremity edema 09/25/2014  . Nausea   . Upper abdominal pain   . Pancreatitis-Idiopathy acute 2.18.16 admitted [facility] 08/08/2014  . Gout 08/08/2014  . CAD s/p stenting 2007, prior LAD h/o 199, last myoview 3/15 Low risk 11/27/2012  . Edema 11/27/2012  . Obesity (BMI 30-39.9) 11/27/2012  . Cervical disc disease s/p diskecktomy 2003  11/27/2012  . GOUT 10/13/2009  . KNEE PAIN 09/08/2009  . PATELLAR TENDINITIS 09/08/2009    Past Surgical History:  Procedure Laterality Date  . ANTERIOR CERVICAL DECOMP/DISCECTOMY FUSION  12-25-2001     dr Orinda [facility]   C3 -- C7  . ANTERIOR CERVICAL DECOMP/DISCECTOMY FUSION  12-11-2012   dr Carloyn Manner  [facility])  . CARDIOVASCULAR STRESS TEST  08-23-2012    dr Claiborne Billings   normal lexiscan nuclear study w/ no ischemia/  normal LV function and wall motion, ef 65%  . CORONARY ANGIOPLASTY  1999   PTCA to RCA & PDA  . CORONARY  ANGIOPLASTY WITH STENT PLACEMENT  10-11-2005  dr Shelva Majestic   PCI and stenting to [facility] ([place] DES x2)  . CYSTOSCOPY W/ URETERAL STENT PLACEMENT Left 02-11-2010     [facility]  . LUMBAR LAMINECTOMY  2015   L5 -- S1  . PILONIDAL CYST EXCISION  1980s  . THROAT SURGERY  1996   per pt "removal piece of cryloid(?) that was pressing against throat"  states no issues since  . TRANSTHORACIC ECHOCARDIOGRAM  08-24-2010  dr Claiborne Billings   ef 50-55%/  mild MR/  trivial TR       Home Medications    Prior to Admission medications   Medication Sig Start Date End Date Taking? Authorizing Provider  allopurinol (ZYLOPRIM) 300 MG tablet Take 300 mg by mouth every morning.  11/06/12   [provider]  aspirin EC 81 MG tablet Take 81 mg by mouth daily.    [provider]  cholecalciferol (VITAMIN D) 1000 units tablet Take 1,000 Units by mouth every morning.      [provider]  clopidogrel (PLAVIX) 75 MG tablet TAKE ONE (1) TABLET BY MOUTH EVERY DAY Patient taking differently: TAKE ONE (1) TABLET BY MOUTH EVERY DAY--- per pt takes every other day (per pt "my doctor knows" 09/07/16   Troy Sine, MD  docusate sodium (COLACE) 100 MG capsule Take 100 mg by mouth at bedtime.    [provider]  lisinopril (PRINIVIL,ZESTRIL) 5 MG tablet Take 5 mg by mouth every evening.     [provider]  metoprolol tartrate (LOPRESSOR) 50 MG tablet TAKE ONE TABLET BY MOUTH TWICE A DAY 03/09/17   Troy Sine, MD  oxyCODONE-acetaminophen (PERCOCET) 10-325 MG per tablet Take 1 tablet by mouth every 8 (eight) hours as needed for pain.  10/11/12   [provider]  predniSONE (DELTASONE) 10 MG tablet Take 4 tabs daily for 6 more days then resume home regimen. Patient taking differently: Take 10 mg by mouth as needed. Take 4 tabs daily for 6 more days then resume home regimen.prn 08/14/14   Radene Gunning, NP  simvastatin (ZOCOR) 20 MG tablet TAKE ONE (1) TABLET AT BEDTIME 08/10/16   Troy Sine, MD  sodium phosphate (FLEET) 7-19 GM/118ML ENEM Place 1 enema rectally daily as needed for severe constipation.    [provider]  tiZANidine (ZANAFLEX) 4 MG tablet Take 4 mg by mouth at bedtime.  11/08/12   [provider]  torsemide (DEMADEX) 20 MG tablet TAKE ONE (1) TABLET BY MOUTH EVERY DAY Patient taking differently: TAKE ONE (1) TABLET BY MOUTH EVERY DAY-- per pt takes as needed 12/18/15   Troy Sine, MD    Family History Family History  Problem Relation Age of Onset  . Cancer Father   . Heart attack Brother   . Diabetes Brother   . Colon cancer Neg Hx     Social History Social History   Tobacco Use  . Smoking status: Former Smoker    Packs/day: 3.00    Years: 25.00    Pack years: 75.00    Types: Cigarettes    Last attempt to quit: 11/27/1988    Years since quitting: 28.4  . Smokeless tobacco: Former  Systems developer    Quit date: 03/28/1989  Substance Use Topics  . Alcohol use: No  . Drug use: No     Allergies   Ibuprofen; Neosporin [neomycin-bacitracin zn-polymyx]; and Penicillins   Review of Systems Review of Systems  All  other systems reviewed and are negative.    Physical Exam Updated Vital Signs BP (!) 105/51   Pulse 85   Temp 97.9 F (36.6 C)   Resp 18   SpO2 94%   Physical Exam  Constitutional: He is oriented to person, place, and time. He appears well-developed and well-nourished. He appears distressed (He is uncomfortable).  HENT:  Head: Normocephalic and atraumatic.  Right Ear: External ear normal.  Left Ear: External ear normal.  Eyes: Conjunctivae and EOM are normal. Pupils are equal, round, and reactive to light.  Neck: Normal range of motion and phonation normal. Neck supple.  Cardiovascular: Normal rate.  Pulmonary/Chest: Effort normal. He exhibits no bony tenderness.  Musculoskeletal:  He splints to avoid movement of the left shoulder.  There is mild pain with internal/external rotation of the left shoulder.  There is no pain with flexion, abduction or extension of the left shoulder.  There is no deformity of the left shoulder.  He is neurovascular intact distally in the left hand.  Neurological: He is alert and oriented to person, place, and time. No cranial nerve deficit or sensory deficit. He exhibits normal muscle tone. Coordination normal.  Skin: Skin is warm, dry and intact.  Psychiatric: He has a normal mood and affect. His behavior is normal. Judgment and thought content normal.  Nursing note and vitals reviewed.    ED Treatments / Results  Labs (all labs ordered are listed, but only abnormal results are displayed) Labs Reviewed - No data to display  EKG  EKG Interpretation None       Radiology Dg Shoulder Left  Result Date: 04/27/2017 CLINICAL DATA:  Acute left shoulder pain following fall yesterday. Initial encounter. EXAM: LEFT SHOULDER  - 2+ VIEW COMPARISON:  10/11/2016 and prior chest radiographs FINDINGS: There is no evidence of acute fracture, subluxation or dislocation. No focal bony lesions are present. Mild joint space loss noted. IMPRESSION: No evidence of acute abnormality. Electronically Signed   By: Margarette Canada M.D.   On: 04/27/2017 18:07    Procedures Procedures (including critical care time)  Medications Ordered in ED Medications  HYDROmorphone (DILAUDID) injection 2 mg (not administered)  ondansetron (ZOFRAN-ODT) disintegrating tablet 8 mg (not administered)     Initial Impression / Assessment and Plan / ED Course  I have reviewed the triage vital signs and the nursing notes.  Pertinent labs & imaging results that were available during my care of the patient were reviewed by me and considered in my medical decision making (see chart for details).      Patient Vitals for the past 24 hrs:  BP Temp Pulse Resp SpO2  04/27/17 1657 (!) 105/51 97.9 F (36.6 C) 85 18 94 %    8:56 PM Reevaluation with update and discussion. After initial assessment and treatment, an updated evaluation reveals he states that he is feeling better at this time.  Findings discussed with patient and family members, all questions were answered. Dolorez Jeffrey L      Final Clinical Impressions(s) / ED Diagnoses   Final diagnoses:  Shoulder injury, left, initial encounter    Fall with contusion left shoulder.  Suspect rotator cuff injury.  No evident fracture, or other associated injury.  Fall appears mechanical related to his back pain.  No indication for further evaluation treatment at this time.  Nursing Notes Reviewed/ Care Coordinated Applicable Imaging Reviewed Interpretation of Laboratory Data incorporated into ED treatment  The patient appears reasonably screened and/or stabilized for discharge and I  doubt any other medical condition or other [facility] requiring further screening, evaluation, or treatment in the ED at this  time prior to discharge.  Plan: Home Medications-continue current medications; Home Treatments-ice to affected area, 3 times daily for 3 days, shoulder immobilizer, PRN; return here if the recommended treatment, does not improve the symptoms; Recommended follow up-orthopedic follow-up 1 week, for further assessment and treatment of the left shoulder injury.   ED Discharge Orders    None       Daleen Bo, MD 04/27/17 2056

## 2017-04-27 NOTE — ED Notes (Signed)
rom limited. No obviuos deformity. Radial pulses present.

## 2017-04-27 NOTE — ED Triage Notes (Signed)
Pt tripped and fell last night. Pt landed on left shoulder. Pt c/o left shoulder pain. Just had back surgery 3 weeks ago. States aching all over now also. Wrapped Abrasion to left elbow noted. A/o.

## 2017-04-29 ENCOUNTER — Ambulatory Visit (HOSPITAL_COMMUNITY)
Admission: RE | Admit: 2017-04-29 | Discharge: 2017-04-29 | Disposition: A | Discharge status: Home / Self Care | Payer: Medicare Other | Source: Ambulatory Visit | Attending: Pulmonary Disease | Admitting: Pulmonary Disease

## 2017-04-29 DIAGNOSIS — J849 Interstitial pulmonary disease, unspecified: Secondary | ICD-10-CM | POA: Insufficient documentation

## 2017-04-29 LAB — PULMONARY FUNCTION TEST
DL/VA % PRED: 91 %
DL/VA: 4.3 ml/min/mmHg/L
DLCO cor % pred: 44 %
DLCO cor: 15.6 ml/min/mmHg
DLCO unc % pred: 44 %
DLCO unc: 15.6 ml/min/mmHg
FEF 25-75 Post: 3.15 L/sec
FEF 25-75 Pre: 2.96 L/sec
FEF2575-%CHANGE-POST: 6 %
FEF2575-%PRED-POST: 116 %
FEF2575-%Pred-Pre: 109 %
FEV1-%CHANGE-POST: 1 %
FEV1-%Pred-Post: 66 %
FEV1-%Pred-Pre: 65 %
FEV1-PRE: 2.32 L
FEV1-Post: 2.35 L
FEV1FVC-%Change-Post: 2 %
FEV1FVC-%PRED-PRE: 115 %
FEV6-%Change-Post: -1 %
FEV6-%Pred-Post: 58 %
FEV6-%Pred-Pre: 59 %
FEV6-POST: 2.66 L
FEV6-PRE: 2.71 L
FEV6FVC-%PRED-POST: 105 %
FEV6FVC-%PRED-PRE: 105 %
FVC-%CHANGE-POST: -1 %
FVC-%PRED-PRE: 56 %
FVC-%Pred-Post: 55 %
FVC-POST: 2.66 L
FVC-PRE: 2.71 L
POST FEV1/FVC RATIO: 88 %
PRE FEV6/FVC RATIO: 100 %
Post FEV6/FVC ratio: 100 %
Pre FEV1/FVC ratio: 86 %
RV % PRED: 81 %
RV: 2.09 L
TLC % pred: 55 %
TLC: 4.1 L

## 2017-04-29 MED ORDER — ALBUTEROL SULFATE (2.5 MG/3ML) 0.083% IN NEBU
2.5000 mg | INHALATION_SOLUTION | Freq: Once | RESPIRATORY_TRACT | Status: AC
  Administered 2017-04-29: 2.5 mg via RESPIRATORY_TRACT

## 2017-05-06 ENCOUNTER — Ambulatory Visit: Payer: Medicare Other | Admitting: Orthopedic Surgery

## 2017-05-06 ENCOUNTER — Telehealth: Payer: Self-pay | Admitting: Cardiovascular Disease

## 2017-05-06 ENCOUNTER — Encounter: Payer: Self-pay | Admitting: Orthopedic Surgery

## 2017-05-06 VITALS — BP 120/70 | HR 73 | Ht 72.0 in | Wt 250.0 lb

## 2017-05-06 DIAGNOSIS — S4992XA Unspecified injury of left shoulder and upper arm, initial encounter: Secondary | ICD-10-CM

## 2017-05-06 LAB — SJOGRENS SYNDROME-A EXTRACTABLE NUCLEAR ANTIBODY: SSA (Ro) (ENA) Antibody, IgG: <1 AI

## 2017-05-06 LAB — NICHOLS-CHANTILLY MISCELLANEOUS ORDER: PRICE:: 214

## 2017-05-06 LAB — SJOGRENS SYNDROME-B EXTRACTABLE NUCLEAR ANTIBODY: SSB (LA) (ENA) ANTIBODY, IGG: NEGATIVE AI

## 2017-05-06 LAB — ANA: ANA: NEGATIVE

## 2017-05-06 LAB — ALDOLASE: ALDOLASE: 4.8 U/L (ref <=8.1)

## 2017-05-06 LAB — RHEUMATOID FACTOR

## 2017-05-06 LAB — ANTI-SCLERODERMA ANTIBODY: Scleroderma (Scl-70) (ENA) Antibody, IgG: <1 AI

## 2017-05-06 NOTE — Progress Notes (Signed)
Progress Note   Patient ID: Maurice Munoz, male   DOB: [birth year], 71 y.o.   MRN: [id number]  Chief Complaint  Patient presents with  . Shoulder Injury    fell 04/26/17 now has shoulder pain left    HPI 71 year old male fell on 6 November.  He says he initially had severe dull constant pain over the left proximal humerus he could move his arm.  Since that time is been able to move it a little bit better his pain is decreasing.  Review of Systems  Constitutional: Negative for chills and fever.  Musculoskeletal: Positive for neck pain.  Skin: Negative for itching and rash.  Neurological: Negative for tingling and sensory change.   Current Meds  Medication Sig  . allopurinol (ZYLOPRIM) 300 MG tablet Take 300 mg by mouth every morning.   Marland Kitchen aspirin EC 81 MG tablet Take 81 mg by mouth daily.  . cholecalciferol (VITAMIN D) 1000 units tablet Take 1,000 Units by mouth every morning.   . clopidogrel (PLAVIX) 75 MG tablet TAKE ONE (1) TABLET BY MOUTH EVERY DAY (Patient taking differently: TAKE ONE (1) TABLET BY MOUTH EVERY DAY--- per pt takes every other day (per pt "my doctor knows")  . docusate sodium (COLACE) 100 MG capsule Take 100 mg by mouth at bedtime.  Marland Kitchen lisinopril (PRINIVIL,ZESTRIL) 5 MG tablet Take 5 mg by mouth every evening.   . metoprolol tartrate (LOPRESSOR) 50 MG tablet TAKE ONE TABLET BY MOUTH TWICE A DAY  . oxyCODONE-acetaminophen (PERCOCET) 10-325 MG per tablet Take 1 tablet by mouth every 8 (eight) hours as needed for pain.   . predniSONE (DELTASONE) 10 MG tablet Take 4 tabs daily for 6 more days then resume home regimen. (Patient taking differently: Take 10 mg by mouth as needed. Take 4 tabs daily for 6 more days then resume home regimen.prn)  . simvastatin (ZOCOR) 20 MG tablet TAKE ONE (1) TABLET AT BEDTIME  . sodium phosphate (FLEET) 7-19 GM/118ML ENEM Place 1 enema rectally daily as needed for severe constipation.  Marland Kitchen tiZANidine (ZANAFLEX) 4 MG tablet Take 4 mg by mouth at  bedtime.   . torsemide (DEMADEX) 20 MG tablet TAKE ONE (1) TABLET BY MOUTH EVERY DAY (Patient taking differently: TAKE ONE (1) TABLET BY MOUTH EVERY DAY-- per pt takes as needed)    Past Medical History:  Diagnosis Date  . Back pain   . Bilateral carotid artery stenosis followed by Maurice Maurice Munoz   per last carotid duplex [id number]  -- proximal LICA 47-82% ,  RICA 9-56%  . CAD (coronary artery disease) cardiologist-  Maurice Munoz   hx PTCA in 1999 to RCA & PDA/  10-11-2005  DES x2 to [place]  . Chronic constipation   . Chronic pain syndrome   . CKD (chronic kidney disease), stage III ([place])   . DDD (degenerative disc disease), cervical   . DDD (degenerative disc disease), lumbosacral   . Dyspnea on exertion   . Gout    per pt last inflared episode 2015 approx.  . H/O epistaxis    per pt sees ENT Maurice Munoz for cauterization (pt takes plavix)  . History of acute pancreatitis 08/08/2014  . History of kidney stones   . HTN (hypertension)   . Hyperlipidemia   . Nocturia   . Numbness of right foot    due to DDD lumbar  . Peripheral edema   . Pilonidal disease   . Pulmonary fibrosis ([place])    followed by  pcp-- last chest CT 04-08-2016  . S/P drug eluting coronary stent placement 10/11/2005   mLAD and dRCA  . Wears dentures    upper  . Wears glasses      Allergies  Allergen Reactions  . Ibuprofen Itching    Motrin brand  . Neosporin [Neomycin-Bacitracin Zn-Polymyx] Swelling  . Penicillins Rash         BP 120/70   Pulse 73   Ht 6' (1.829 m)   Wt 250 lb (113.4 kg)   BMI 33.91 kg/m    Physical Exam  Constitutional: He is oriented to person, place, and time. He appears well-developed and well-nourished.  Neurological: He is alert and oriented to person, place, and time. Gait abnormal.  Skin: Skin is warm. Capillary refill takes less than 2 seconds.  Psychiatric: He has a normal mood and affect. His behavior is normal. Judgment and thought content normal.    Ortho  Exam  Left shoulder tenderness over the proximal humerus and deltoid.  Passive range of motion is painful but normal active range of motion is decreased, stability is shoulder normal motor exam shows mild weakness in the supraspinatus but normal strength in the internal and external rotators.  Skin is not ecchymotic there are no nodules.  Supraclavicular region is negative for any lymph node enlargement.  Mild neck tenderness but that seems to be chronic.  No neurovascular deficits are encountered or detected.   Medical decision-making  Imaging: We have an image from the hospital when he went to the ER.  There is no fracture or dislocation.  That is my personal interpretation   Encounter Diagnosis  Name Primary?  . Shoulder injury, left, initial encounter Yes    He appears to have shoulder pain from the trauma, I do not detect rotator cuff, recommend injection home exercises follow-up to see if he needs an MRI  Procedure note the subacromial injection shoulder left   Verbal consent was obtained to inject the  Left   Shoulder  Timeout was completed to confirm the injection site is a subacromial space of the  left  shoulder  Medication used Depo-Medrol 40 mg and lidocaine 1% 3 cc  Anesthesia was provided by ethyl chloride  The injection was performed in the left  posterior subacromial space. After pinning the skin with alcohol and anesthetized the skin with ethyl chloride the subacromial space was injected using a 20-gauge needle. There were no complications  Sterile dressing was applied.   Maurice Abbott, MD 05/06/2017 9:27 AM

## 2017-05-06 NOTE — Patient Instructions (Signed)
You have received an injection of steroids into the joint. 15% of patients will have increased pain within the 24 hours postinjection.   This is transient and will go away.   We recommend that you use ice packs on the injection site for 20 minutes every 2 hours and extra strength Tylenol 2 tablets every 8 as needed until the pain resolves.  If you continue to have pain after taking the Tylenol and using the ice please call the office for further instructions.    Exercises as instructed 3 times a day

## 2017-05-06 NOTE — Telephone Encounter (Signed)
Reviewed chart with [employer] Rep and faxed OV note 06-28-16

## 2017-05-06 NOTE — Telephone Encounter (Signed)
New Message  Jonni Sanger from [place] call requesting to speak with RN to gather some information on pts insurance. Pease call back to discuss

## 2017-05-18 ENCOUNTER — Ambulatory Visit: Payer: Medicare Other | Admitting: Pulmonary Disease

## 2017-05-18 ENCOUNTER — Ambulatory Visit (INDEPENDENT_AMBULATORY_CARE_PROVIDER_SITE_OTHER): Payer: Medicare Other | Admitting: *Deleted

## 2017-05-18 ENCOUNTER — Encounter: Payer: Self-pay | Admitting: Pulmonary Disease

## 2017-05-18 VITALS — BP 132/70 | HR 77 | Ht 72.0 in | Wt 252.0 lb

## 2017-05-18 DIAGNOSIS — J849 Interstitial pulmonary disease, unspecified: Secondary | ICD-10-CM

## 2017-05-18 DIAGNOSIS — R0602 Shortness of breath: Secondary | ICD-10-CM | POA: Diagnosis not present

## 2017-05-18 DIAGNOSIS — R0600 Dyspnea, unspecified: Secondary | ICD-10-CM

## 2017-05-18 NOTE — Progress Notes (Signed)
SIX MIN WALK 05/18/2017  Medications oxycodone, metoprolol, asa, allopurinol taken at 7:30 am  Supplimental Oxygen during Test? (L/min) No  Laps 3  Partial Lap (in Meters) 0  Baseline BP (sitting) 118/70  Baseline Heartrate 65  Baseline Dyspnea (Borg Scale) 1  Baseline Fatigue (Borg Scale) 3  Baseline SPO2 100  BP (sitting) 130/66  Heartrate 89  Dyspnea (Borg Scale) 4  Fatigue (Borg Scale) 4  SPO2 93  BP (sitting) 126/70  Heartrate 70  SPO2 99  Stopped or Paused before Six Minutes Yes  Other Symptoms at end of Exercise stopped with 2:30 min remaining due to SOB and leg pain  Distance Completed 144  Tech Comments: pt walked with a cane. He had a recent fall and also spine surgery approx 1 month ago//lmr

## 2017-05-18 NOTE — Patient Instructions (Signed)
Interstitial lung disease/scarring in your lungs: As discussed today in clinic at this point I see no evidence that you have a condition that will progress.  I think that the scarring in your lungs is due to your occupational exposures over the years.  However, we need to get a repeat lung function test in 6 months to make sure things are not getting worse.  You need to let me know if you were feeling more shortness of breath as well.  Otherwise, we will plan on seeing you back in 6 months

## 2017-05-18 NOTE — Progress Notes (Signed)
Subjective:    Patient ID: Maurice Munoz, male    DOB: June 04, 1946, 71 y.o.   MRN: [id number]  Synopsis: Referred in 2018 for pulmonary fibrosis, he has an extensive occupational history.  He smoked 2-3 packs of cigarettes daily for 20-25 years, quit in the 1980s.  He worked in Lobbyist, building with stenosis.  Frequently works with Engineer, materials.  He also did chrome plating for many years and was frequently exposed to and covered in from dust.  He has worked in Industrial/product designer.  HPI Chief Complaint  Patient presents with  . Follow-up    review PFT and 39m.  pt states he is doing well, DOE unchanged.     RNirvaanhad a fall after the last visit and he hurt his left arm pretty badly.  He says that it is slowly improving.  This has limited his ability to go to pulmonary rehab.  He has a lot of tests with his various surgeons over the next few weeks.  He still has problems with dyspnea.  > he attributes this to not exercising > it hasn't worsened > he is still singing and playing music  Past Medical History:  Diagnosis Date  . Back pain   . Bilateral carotid artery stenosis followed by dr kClaiborne Billings  per last carotid duplex [phone] -- proximal LICA 509-40%,  RICA 07-68% . CAD (coronary artery disease) cardiologist-  dr t. kClaiborne Billings  hx PTCA in 1999 to RCA & PDA/  10-11-2005  [place] . Chronic constipation   . Chronic pain syndrome   . CKD (chronic kidney disease), stage III ([place]   . DDD (degenerative disc disease), cervical   . DDD (degenerative disc disease), lumbosacral   . Dyspnea on exertion   . Gout    per pt last inflared episode 2015 approx.  . H/O epistaxis    per pt sees ENT dr tBenjamine Molafor cauterization (pt takes plavix)  . History of acute pancreatitis 08/08/2014  . History of kidney stones   . HTN (hypertension)   . Hyperlipidemia   . Nocturia   . Numbness of right foot    due to DDD lumbar  . Peripheral edema   . Pilonidal disease   . Pulmonary fibrosis ([place]     followed by pcp-- last chest CT 04-08-2016  . S/P drug eluting coronary stent placement 10/11/2005   mLAD and dRCA  . Wears dentures    upper  . Wears glasses         Review of Systems  Constitutional: Positive for fatigue. Negative for fever and unexpected weight change.  HENT: Negative for congestion, dental problem, ear pain, nosebleeds, postnasal drip, rhinorrhea, sinus pressure, sneezing, sore throat and trouble swallowing.   Eyes: Negative for redness and itching.  Respiratory: Positive for shortness of breath. Negative for cough, chest tightness and wheezing.   Cardiovascular: Negative for palpitations and leg swelling.  Gastrointestinal: Negative for nausea and vomiting.  Genitourinary: Negative for dysuria.  Musculoskeletal: Negative for joint swelling.  Skin: Negative for rash.  Neurological: Negative for headaches.  Hematological: Does not bruise/bleed easily.  Psychiatric/Behavioral: Negative for dysphoric mood. The patient is not nervous/anxious.        Objective:   Physical Exam Vitals:   05/18/17 1003  BP: 132/70  Pulse: 77  SpO2: 97%  Weight: 252 lb (114.3 kg)  Height: 6' (1.829 m)     Gen: chronically ill appearing HENT: OP  clear, TM's clear, neck supple PULM: Crackles left base B, normal percussion CV: RRR, no mgr, trace edema GI: BS+, soft, nontender Derm: no cyanosis or rash Psyche: normal mood and affect    CBC    Component Value Date/Time   WBC 8.4 09/12/2015 1531   RBC 5.24 09/12/2015 1531   HGB 14.3 03/31/2017 0600   HCT 42.0 03/31/2017 0600   PLT 199 09/12/2015 1531   MCV 85.7 09/12/2015 1531   MCH 28.4 09/12/2015 1531   MCHC 33.2 09/12/2015 1531   RDW 13.6 09/12/2015 1531   LYMPHSABS 1.5 08/08/2014 0554   MONOABS 0.8 08/08/2014 0554   EOSABS 0.1 08/08/2014 0554   BASOSABS 0.0 08/08/2014 0554   January 2018 cardiology records reviewed where he was cared for for hypertension hyperlipidemia and coronary artery  disease October 2018 surgery records reviewed, he had a pilonidal cyst removed  Labs: 04/2017 ESR, RA, Jo-1, SCL-70, SSA/SSB all negative, ANA, Centromere all negative  Chest imaging: October 2018 chest CT images independently reviewed showing nonspecific intralobular septal thickening but no clear honeycombing, no clear cranial cardio gradient   Pulmonary function test: November 2018 ratio 88%, FVC 2.66 L 55% predicted, total lung capacity 4 0.10 L 55% predicted, ERV 42% predicted, DLCO 15.60 mL 44% predicted  6-minute walk: November 2018: Only walked 4 minutes and 30 seconds, stopped secondary to shortness of breath and leg pain.  Walked 144 m.  O2 saturation remained between 93 and 100% during the test on room air      Assessment & Plan:   Dyspnea, unspecified type - Plan: Pulmonary function test  ILD (interstitial lung disease) ([place])  Discussion: Maurice Munoz says that his breathing is unchanged compared to the last visit.  Based on his CT scan and lung function test there is no question in my mind that he has interstitial lung disease.  However, the pattern on CT scanning is not suggestive of any distinct abnormality such as usual interstitial pneumonitis.  Lab work did not show evidence of an underlying connective tissue disease.  So at this point I must conclude that there is scarring in his lungs is likely related to his prior occupational exposures.  However, there is no guarantee that this will not progress so we need to get another lung function test in 6 months to make sure things are not worsening.  Plan: Interstitial lung disease/scarring in your lungs: As discussed today in clinic at this point I see no evidence that you have a condition that will progress.  I think that the scarring in your lungs is due to your occupational exposures over the years.  However, we need to get a repeat lung function test in 6 months to make sure things are not getting worse.  You need to let me  know if you were feeling more shortness of breath as well.  Otherwise, we will plan on seeing you back in 6 months   Current Outpatient Medications:  .  allopurinol (ZYLOPRIM) 300 MG tablet, Take 300 mg by mouth every morning. , Disp: , Rfl:  .  aspirin EC 81 MG tablet, Take 81 mg by mouth daily., Disp: , Rfl:  .  cholecalciferol (VITAMIN D) 1000 units tablet, Take 1,000 Units by mouth every morning. , Disp: , Rfl:  .  clopidogrel (PLAVIX) 75 MG tablet, TAKE ONE (1) TABLET BY MOUTH EVERY DAY (Patient taking differently: TAKE ONE (1) TABLET BY MOUTH EVERY DAY--- per pt takes every other day (per pt "my doctor  knows"), Disp: 90 tablet, Rfl: 3 .  docusate sodium (COLACE) 100 MG capsule, Take 100 mg by mouth at bedtime., Disp: , Rfl:  .  lisinopril (PRINIVIL,ZESTRIL) 5 MG tablet, Take 5 mg by mouth every evening. , Disp: , Rfl:  .  metoprolol tartrate (LOPRESSOR) 50 MG tablet, TAKE ONE TABLET BY MOUTH TWICE A DAY, Disp: 180 tablet, Rfl: 0 .  oxyCODONE-acetaminophen (PERCOCET) 10-325 MG per tablet, Take 1 tablet by mouth every 8 (eight) hours as needed for pain. , Disp: , Rfl:  .  predniSONE (DELTASONE) 10 MG tablet, Take 4 tabs daily for 6 more days then resume home regimen. (Patient taking differently: Take 10 mg by mouth as needed. Take 4 tabs daily for 6 more days then resume home regimen.prn), Disp: 24 tablet, Rfl: o .  simvastatin (ZOCOR) 20 MG tablet, TAKE ONE (1) TABLET AT BEDTIME, Disp: 90 tablet, Rfl: 1 .  sodium phosphate (FLEET) 7-19 GM/118ML ENEM, Place 1 enema rectally daily as needed for severe constipation., Disp: , Rfl:  .  tiZANidine (ZANAFLEX) 4 MG tablet, Take 4 mg by mouth at bedtime. , Disp: , Rfl:  .  torsemide (DEMADEX) 20 MG tablet, TAKE ONE (1) TABLET BY MOUTH EVERY DAY (Patient taking differently: TAKE ONE (1) TABLET BY MOUTH EVERY DAY-- per pt takes as needed), Disp: 30 tablet, Rfl: 10

## 2017-05-25 DIAGNOSIS — M4716 Other spondylosis with myelopathy, lumbar region: Secondary | ICD-10-CM | POA: Diagnosis not present

## 2017-05-26 DIAGNOSIS — S39012A Strain of muscle, fascia and tendon of lower back, initial encounter: Secondary | ICD-10-CM | POA: Diagnosis not present

## 2017-05-26 DIAGNOSIS — G894 Chronic pain syndrome: Secondary | ICD-10-CM | POA: Diagnosis not present

## 2017-05-27 ENCOUNTER — Ambulatory Visit: Payer: Medicare Other | Admitting: Orthopedic Surgery

## 2017-06-15 ENCOUNTER — Other Ambulatory Visit: Payer: Self-pay | Admitting: Cardiovascular Disease

## 2017-06-22 DIAGNOSIS — J8410 Pulmonary fibrosis, unspecified: Secondary | ICD-10-CM | POA: Diagnosis not present

## 2017-06-22 DIAGNOSIS — R05 Cough: Secondary | ICD-10-CM | POA: Diagnosis not present

## 2017-06-22 DIAGNOSIS — E6609 Other obesity due to excess calories: Secondary | ICD-10-CM | POA: Diagnosis not present

## 2017-06-22 DIAGNOSIS — Z6835 Body mass index (BMI) 35.0-35.9, adult: Secondary | ICD-10-CM | POA: Diagnosis not present

## 2017-06-22 DIAGNOSIS — R062 Wheezing: Secondary | ICD-10-CM | POA: Diagnosis not present

## 2017-06-22 DIAGNOSIS — J3489 Other specified disorders of nose and nasal sinuses: Secondary | ICD-10-CM | POA: Diagnosis not present

## 2017-07-06 DIAGNOSIS — M48061 Spinal stenosis, lumbar region without neurogenic claudication: Secondary | ICD-10-CM | POA: Diagnosis not present

## 2017-07-06 DIAGNOSIS — M4807 Spinal stenosis, lumbosacral region: Secondary | ICD-10-CM | POA: Diagnosis not present

## 2017-07-06 DIAGNOSIS — Z981 Arthrodesis status: Secondary | ICD-10-CM | POA: Diagnosis not present

## 2017-07-06 DIAGNOSIS — M4716 Other spondylosis with myelopathy, lumbar region: Secondary | ICD-10-CM | POA: Diagnosis not present

## 2017-07-13 DIAGNOSIS — M4716 Other spondylosis with myelopathy, lumbar region: Secondary | ICD-10-CM | POA: Diagnosis not present

## 2017-07-27 DIAGNOSIS — G894 Chronic pain syndrome: Secondary | ICD-10-CM | POA: Diagnosis not present

## 2017-07-27 DIAGNOSIS — Z79891 Long term (current) use of opiate analgesic: Secondary | ICD-10-CM | POA: Diagnosis not present

## 2017-07-27 DIAGNOSIS — E785 Hyperlipidemia, unspecified: Secondary | ICD-10-CM | POA: Diagnosis not present

## 2017-07-27 DIAGNOSIS — E782 Mixed hyperlipidemia: Secondary | ICD-10-CM | POA: Diagnosis not present

## 2017-07-27 DIAGNOSIS — E6609 Other obesity due to excess calories: Secondary | ICD-10-CM | POA: Diagnosis not present

## 2017-07-27 DIAGNOSIS — I700 Atherosclerosis of aorta: Secondary | ICD-10-CM | POA: Diagnosis not present

## 2017-07-27 DIAGNOSIS — E669 Obesity, unspecified: Secondary | ICD-10-CM | POA: Diagnosis not present

## 2017-07-27 DIAGNOSIS — Z125 Encounter for screening for malignant neoplasm of prostate: Secondary | ICD-10-CM | POA: Diagnosis not present

## 2017-07-27 DIAGNOSIS — M75102 Unspecified rotator cuff tear or rupture of left shoulder, not specified as traumatic: Secondary | ICD-10-CM | POA: Diagnosis not present

## 2017-07-27 DIAGNOSIS — J8410 Pulmonary fibrosis, unspecified: Secondary | ICD-10-CM | POA: Diagnosis not present

## 2017-07-27 DIAGNOSIS — E748 Other specified disorders of carbohydrate metabolism: Secondary | ICD-10-CM | POA: Diagnosis not present

## 2017-07-27 DIAGNOSIS — I10 Essential (primary) hypertension: Secondary | ICD-10-CM | POA: Diagnosis not present

## 2017-07-27 DIAGNOSIS — Z6836 Body mass index (BMI) 36.0-36.9, adult: Secondary | ICD-10-CM | POA: Diagnosis not present

## 2017-08-01 ENCOUNTER — Telehealth: Payer: Self-pay | Admitting: Cardiovascular Disease

## 2017-08-01 NOTE — Telephone Encounter (Signed)
Will see patient in the office on 08/03/16.

## 2017-08-01 NOTE — Telephone Encounter (Signed)
Spoke with patient and he has been having chest pain and shortness of breath but he is unsure if related to his lung disease. He is scheduled for appointment 08/03/17. He has been up doing things and is currently having chest tightness/pain. Did advise if he is currently having chest pains he needed to go to ED for evaluation. Stated he would go if worse. Will forward to Dr Claiborne Billings so he will be aware.

## 2017-08-01 NOTE — Telephone Encounter (Signed)
New Message     Pt c/o Shortness Of Breath: STAT if SOB developed within the last 24 hours or pt is noticeably SOB on the phone  1. Are you currently SOB (can you hear that pt is SOB on the phone)? no  2. How long have you been experiencing SOB? A couple weeks and then he started getting a burning and pain in chest   3. Are you SOB when sitting or when up moving around? Both   4. Are you currently experiencing any other symptoms? A burning sensation and low pain

## 2017-08-02 DIAGNOSIS — T8131XD Disruption of external operation (surgical) wound, not elsewhere classified, subsequent encounter: Secondary | ICD-10-CM | POA: Diagnosis not present

## 2017-08-03 ENCOUNTER — Encounter: Payer: Self-pay | Admitting: Cardiovascular Disease

## 2017-08-03 ENCOUNTER — Ambulatory Visit (INDEPENDENT_AMBULATORY_CARE_PROVIDER_SITE_OTHER): Payer: Medicare Other | Admitting: Cardiovascular Disease

## 2017-08-03 VITALS — BP 138/72 | HR 57 | Ht 72.0 in | Wt 260.0 lb

## 2017-08-03 DIAGNOSIS — R079 Chest pain, unspecified: Secondary | ICD-10-CM

## 2017-08-03 DIAGNOSIS — I2510 Atherosclerotic heart disease of native coronary artery without angina pectoris: Secondary | ICD-10-CM

## 2017-08-03 DIAGNOSIS — E668 Other obesity: Secondary | ICD-10-CM | POA: Diagnosis not present

## 2017-08-03 DIAGNOSIS — J849 Interstitial pulmonary disease, unspecified: Secondary | ICD-10-CM

## 2017-08-03 DIAGNOSIS — E785 Hyperlipidemia, unspecified: Secondary | ICD-10-CM | POA: Diagnosis not present

## 2017-08-03 MED ORDER — AMLODIPINE BESYLATE 5 MG PO TABS: 5.0000 mg | ORAL_TABLET | Freq: Every day | ORAL | 3 refills | Status: DC

## 2017-08-03 MED ORDER — NITROGLYCERIN 0.4 MG SL SUBL: 0.4000 mg | SUBLINGUAL_TABLET | SUBLINGUAL | 3 refills | Status: DC | PRN

## 2017-08-03 NOTE — Patient Instructions (Signed)
Medication Instructions:  START amlodipine 5 mg daily  Take Nitroglycerin as directed  Testing/Procedures: Your physician has requested that you have an echocardiogram. Echocardiography is a painless test that uses sound waves to create images of your heart. It provides your doctor with information about the size and shape of your heart and how well your heart's chambers and valves are working. This procedure takes approximately one hour. There are no restrictions for this procedure.  This will be done at our [facility] location:  [place] has requested that you have a lexiscan myoview. For further information please visit [email or web address]. Please follow instruction sheet, as given.  Follow-Up: 3 weeks with Dr. Claiborne Billings  Any Other Special Instructions Will Be Listed Below (If Applicable).     If you need a refill on your cardiac medications before your next appointment, please call your pharmacy.

## 2017-08-03 NOTE — Progress Notes (Signed)
Patient ID: Maurice Munoz, male   DOB: [birth year], 72 y.o.   MRN: [id number]      HPI: ULMER DEGEN, is a 72 y.o. male who presents to the office today for a 38 month cardiology followup evaluation.     Mr. Loukas Antonson has established CAD dating back to 1999 when he underwent intervention to his RCA and PDA. In April 2007 he underwent stenting to his LAD and distal RCA at which time Cypher DES stents were inserted. He has a history of hypertension, obesity, hyperlipidemia, gout, and  lower extremity edema.   A two-year followup nuclear perfusion study in March 2014 was essentially unchanged from his prior study and continued to show fairly normal perfusion and was low risk, without evidence for scar. There was no significant ischemia.  In 2015 he had of the Plavix for 3 months due to injury to his right leg.  He has noted some intermittent leg swelling.  He had carotids Doppler study in January 2016 which showed 50-60% left proximal ICA diameter reduction, which had slightly increased from his prior study one year previously.  Because of a history of coughing up blood with pleuritic chest pain he underwent a CT Ango of his chest on March 21 which was negative for PE but showed diffuse peripheral lung densities throughout both upper and lower zones without honeycombing raising the [name] concern for mild fibrotic changes but the pattern was nonspecific.    Over the last several years he has lost over 40 pounds from his peak weight.  He  was seen in the emergency room with lightheadedness and was hypotensive.  He was told to discontinue his isosorbide as well as lisinopril.   In the setting of his hypotension he was found to have an increased creatinine at 1.4 recently had been 1.1. He has been followed by Dr.Befakadu in [place] for his renal issues.  Over the past year, he has experienced occasional episodes of chest pain which he describes as tightness.  Typically, this can occur with  exertion.  At times he also notices a chest burning.  He admits to shortness of breath.  He has been evaluated by [name] for pulmonary fibrosis.  He has an extensive occupational history infrequently had worked in Lobbyist, Engineer, materials, and chrome plating for many years and was often exposed to significant dust inhalation.  He also had worked in Industrial/product designer.  There is remote tobacco history of 2-3 packs per day for 20-25 years, but unfortunately he quit in 1980s.  He is felt most likely to have interstitial lung disease and continues further evaluation.  He has had difficulty with his low back.  He has been on lisinopril 5 mg, metoprolol 50 mg twice a day.  He continues to take simvastatin 20 mg for hyperlipidemia.  He is been maintained on aspirin and Plavix.  There is remote history of gout, which is controlled with allopurinol.  He presents for one-year evaluation.  Past Medical History:  Diagnosis Date  . Back pain   . Bilateral carotid artery stenosis followed by dr Claiborne Billings   per last carotid duplex [id number]  -- proximal LICA 97-98% ,  RICA 9-21%  . CAD (coronary artery disease) cardiologist-  dr t. Claiborne Billings   hx PTCA in 1999 to RCA & PDA/  10-11-2005  DES x2 to [place]  . Chronic constipation   . Chronic pain syndrome   . CKD (chronic kidney disease), stage III ([place])   .  DDD (degenerative disc disease), cervical   . DDD (degenerative disc disease), lumbosacral   . Dyspnea on exertion   . Gout    per pt last inflared episode 2015 approx.  . H/O epistaxis    per pt sees ENT dr Benjamine Mola for cauterization (pt takes plavix)  . History of acute pancreatitis 08/08/2014  . History of kidney stones   . HTN (hypertension)   . Hyperlipidemia   . Nocturia   . Numbness of right foot    due to DDD lumbar  . Peripheral edema   . Pilonidal disease   . Pulmonary fibrosis ([place])    followed by pcp-- last chest CT 04-08-2016  . S/P drug eluting coronary stent placement 10/11/2005   mLAD and dRCA  .  Wears dentures    upper  . Wears glasses     Past Surgical History:  Procedure Laterality Date  . ANTERIOR CERVICAL DECOMP/DISCECTOMY FUSION  12-25-2001     dr Orinda Kenner [facility]   C3 -- C7  . ANTERIOR CERVICAL DECOMP/DISCECTOMY FUSION  12-11-2012   dr Carloyn Manner  [facility])  . CARDIOVASCULAR STRESS TEST  08-23-2012    dr Claiborne Billings   normal lexiscan nuclear study w/ no ischemia/  normal LV function and wall motion, ef 65%  . CORONARY ANGIOPLASTY  1999   PTCA to RCA & PDA  . CORONARY ANGIOPLASTY WITH STENT PLACEMENT  10-11-2005  dr Shelva Majestic   PCI and stenting to [place] (Cypher DES x2)  . CYSTOSCOPY W/ URETERAL STENT PLACEMENT Left 02-11-2010     [facility]  . LUMBAR LAMINECTOMY  2015   L5 -- S1  . PILONIDAL CYST EXCISION  1980s  . PILONIDAL CYST EXCISION N/A 03/31/2017   Procedure: EXCISION OF PILONIDAL DISEASE with Flap Rotation;  Surgeon: Leighton Ruff, MD;  Location: [place];  Service: General;  Laterality: N/A;  . THROAT SURGERY  1996   per pt "removal piece of cryloid(?) that was pressing against throat"  states no issues since  . TRANSTHORACIC ECHOCARDIOGRAM  08-24-2010  dr Claiborne Billings   ef 50-55%/  mild MR/  trivial TR    Allergies  Allergen Reactions  . Ibuprofen Itching    Motrin brand  . Neosporin [Neomycin-Bacitracin Zn-Polymyx] Swelling  . Penicillins Rash         Current Outpatient Medications  Medication Sig Dispense Refill  . allopurinol (ZYLOPRIM) 300 MG tablet Take 300 mg by mouth every morning.     Marland Kitchen aspirin EC 81 MG tablet Take 81 mg by mouth daily.    . cholecalciferol (VITAMIN D) 1000 units tablet Take 1,000 Units by mouth every morning.     . clopidogrel (PLAVIX) 75 MG tablet TAKE ONE (1) TABLET BY MOUTH EVERY DAY (Patient taking differently: TAKE ONE (1) TABLET BY MOUTH EVERY DAY--- per pt takes every other day (per pt "my doctor knows") 90 tablet 3  . docusate sodium (COLACE) 100 MG capsule Take 100 mg by mouth at bedtime.    Marland Kitchen  lisinopril (PRINIVIL,ZESTRIL) 5 MG tablet Take 5 mg by mouth every evening.     . metoprolol tartrate (LOPRESSOR) 50 MG tablet TAKE ONE TABLET BY MOUTH TWICE A DAY 180 tablet 0  . oxyCODONE-acetaminophen (PERCOCET) 10-325 MG per tablet Take 1 tablet by mouth every 8 (eight) hours as needed for pain.     . predniSONE (DELTASONE) 10 MG tablet Take 4 tabs daily for 6 more days then resume home regimen. (Patient taking differently: Take  10 mg by mouth as needed. Take 4 tabs daily for 6 more days then resume home regimen.prn) 24 tablet o  . simvastatin (ZOCOR) 20 MG tablet TAKE ONE (1) TABLET AT BEDTIME 90 tablet 1  . sodium phosphate (FLEET) 7-19 GM/118ML ENEM Place 1 enema rectally daily as needed for severe constipation.    Marland Kitchen tiZANidine (ZANAFLEX) 4 MG tablet Take 4 mg by mouth at bedtime.     . torsemide (DEMADEX) 20 MG tablet TAKE ONE (1) TABLET BY MOUTH EVERY DAY (Patient taking differently: TAKE ONE (1) TABLET BY MOUTH EVERY DAY-- per pt takes as needed) 30 tablet 10  . amLODipine (NORVASC) 5 MG tablet Take 1 tablet (5 mg total) by mouth daily. 90 tablet 3  . nitroGLYCERIN (NITROSTAT) 0.4 MG SL tablet Place 1 tablet (0.4 mg total) under the tongue every 5 (five) minutes as needed for chest pain. 25 tablet 3   No current facility-administered medications for this visit.     Socially he is married. There are no children. Has a remote tobacco history having quit approximately 16 years ago. He's not very active. He does not routinely exercise.  ROS General: Negative; No fevers, chills, or night sweats;  HEENT: Negative; No changes in vision or hearing, sinus congestion, difficulty swallowing Pulmonary: Positive for recent pleuritic chest pain Cardiovascular: See history of present illness GI: Negative; No nausea, vomiting, diarrhea, or abdominal pain GU: Negative; No dysuria, hematuria, or difficulty voiding Musculoskeletal: Negative; no myalgias, joint pain, or weakness Hematologic/Oncology:  Negative; no easy bruising, bleeding Endocrine: Negative; no heat/cold intolerance; no diabetes Neuro: Negative; no changes in balance, headaches Skin: Negative; No rashes or skin lesions Psychiatric: Negative; No behavioral problems, depression Sleep: Negative; No snoring, daytime sleepiness, hypersomnolence, bruxism, restless legs, hypnogognic hallucinations, no cataplexy Other comprehensive 14 point system review is negative.   PE BP 138/72   Pulse (!) 57   Ht 6' (1.829 m)   Wt 260 lb (117.9 kg)   SpO2 96%   BMI 35.26 kg/m    Wt Readings from Last 3 Encounters:  08/03/17 260 lb (117.9 kg)  05/18/17 252 lb (114.3 kg)  05/06/17 250 lb (113.4 kg)   General: Alert, oriented, no distress.  Skin: normal turgor, no rashes, warm and dry HEENT: Normocephalic, atraumatic. Pupils equal round and reactive to light; sclera anicteric; extraocular muscles intact;  Nose without nasal septal hypertrophy Mouth/Parynx benign; Mallinpatti scale 3 Neck: No JVD, no carotid bruits; normal carotid upstroke Lungs: Decreased breath sounds without active wheezing. Chest wall: without tenderness to palpitation Heart: PMI not displaced, RRR, s1 s2 normal, 1/6 systolic murmur, no diastolic murmur, no rubs, gallops, thrills, or heaves Abdomen: soft, nontender; no hepatosplenomehaly, BS+; abdominal aorta nontender and not dilated by palpation. Back: no CVA tenderness Pulses 2+ Musculoskeletal: full range of motion, normal strength, no joint deformities Extremities: no clubbing cyanosis or edema, Homan's sign negative  Neurologic: grossly nonfocal; Cranial nerves grossly wnl Psychologic: Normal mood and affect  ECG (independently read by me): Sinus bradycardia 57 bpm.  No ectopy.  Normal intervals.  January 2018 ECG (independently read by me): Sinus rhythm at 60 bpm.  No ectopy.  Normal intervals.  June 2017 ECG (independently read by me): Normal sinus rhythm at 61 bpm.  No ectopy.  Normal  intervals.  April 2016 ECG (independently read by me): Normal sinus rhythm at 67 bpm.  No ectopy.  Normal intervals.  October 2014 ECG: Normal sinus rhythm at 63 beats per minute. No significant ST  abnormalities. Normal intervals.  LABS:  BMP Latest Ref Rng & Units 03/31/2017 11/24/2015 09/12/2015  Glucose 65 - 99 mg/dL 98 95 108(H)  BUN 6 - 20 mg/dL 16 21 32(H)  Creatinine 0.61 - 1.24 mg/dL 1.20 1.36(H) 1.40(H)  BUN/Creat Ratio 10 - 24 - 15 -  Sodium 135 - 145 mmol/L 141 143 140  Potassium 3.5 - 5.1 mmol/L 4.2 4.7 4.5  Chloride 101 - 111 mmol/L 101 102 104  CO2 18 - 29 mmol/L - 26 29  Calcium 8.6 - 10.2 mg/dL - 9.4 9.1    Hepatic Function Latest Ref Rng & Units 11/24/2015 08/10/2014 08/10/2014  Total Protein 6.0 - 8.5 g/dL 6.5 7.2 7.2  Albumin 3.6 - 4.8 g/dL 4.1 3.4(L) 3.4(L)  AST 0 - 40 IU/L 19 18 18   ALT 0 - 44 IU/L 14 12 13   Alk Phosphatase 39 - 117 IU/L 88 66 66  Total Bilirubin 0.0 - 1.2 mg/dL 1.0 2.3(H) 2.6(H)  Bilirubin, Direct 0.0 - 0.5 mg/dL - 0.4 -   CBC Latest Ref Rng & Units 03/31/2017 09/12/2015 08/08/2014  WBC 4.0 - 10.5 K/uL - 8.4 10.5  Hemoglobin 13.0 - 17.0 g/dL 14.3 14.9 14.6  Hematocrit 39.0 - 52.0 % 42.0 44.9 43.7  Platelets 150 - 400 K/uL - 199 217   Lab Results  Component Value Date   MCV 85.7 09/12/2015   MCV 87.8 08/08/2014   MCV 86.5 02/12/2010    Lab Results  Component Value Date   TSH 1.150 11/24/2015   Lipid Panel     Component Value Date/Time   CHOL 144 11/24/2015 1127   TRIG 131 11/24/2015 1127   HDL 39 (L) 11/24/2015 1127   CHOLHDL 4.2 08/10/2014 0555   VLDL 24 08/10/2014 0555   LDLCALC 79 11/24/2015 1127     RADIOLOGY: No results found.  IMPRESSION:  1. Chest pain with high risk for cardiac etiology   2. CAD in native artery   3. Interstitial lung disease ([place])   4. Hyperlipidemia LDL goal <70   5. Moderate obesity      ASSESSMENT AND PLAN: Mr. Minnehan is a 72 year old Caucasian male who is almost 19 years since his initial  intervention to his RCA and PDA and 11 years since stenting to his LAD and distal RCA.  He has a history of hypertension.  His last nuclear perfusion study was in March 2014, which remained fairly stable.  Due to his significant occupational exposure, remote tobacco, and abnormal PFTs and CT imaging.  He is felt most likely to have interstitial lung disease and is undergoing further evaluation by [name].  He recently has noticed episodes of chest burning as well as chest tightness and continues to experience previous exertional dyspnea.  I am recommending initiation of amlodipine 5 mg, which will also help his blood pressure which is elevated today at 144/74.  I have strongly recommended support stockings to reduce potential for edema.  I'm giving him a prescription for sublingual nitroglycerin to see if this improves his symptoms.  If he does experience an episode.  I am scheduling him for follow-up [place] study to assess potential ischemia.  I will also schedule him for an echo Doppler study to assess systolic and diastolic function as well as pulmonary pressures.  I'll see him in the office in several months for reevaluation and further recommendations will be made at that time. Time spent: 25 minutes  Troy Sine, MD, [facility]  08/05/2017 1:54 PM

## 2017-08-05 ENCOUNTER — Encounter: Payer: Self-pay | Admitting: Cardiovascular Disease

## 2017-08-09 ENCOUNTER — Telehealth (HOSPITAL_COMMUNITY): Payer: Self-pay

## 2017-08-09 NOTE — Telephone Encounter (Signed)
Encounter complete. 

## 2017-08-10 ENCOUNTER — Telehealth (HOSPITAL_COMMUNITY): Payer: Self-pay

## 2017-08-10 NOTE — Telephone Encounter (Signed)
Encounter complete. 

## 2017-08-12 ENCOUNTER — Ambulatory Visit (HOSPITAL_COMMUNITY)
Admission: RE | Admit: 2017-08-12 | Discharge: 2017-08-12 | Disposition: A | Discharge status: Home / Self Care | Payer: Medicare Other | Source: Ambulatory Visit | Attending: Cardiology | Admitting: Cardiology

## 2017-08-12 DIAGNOSIS — R079 Chest pain, unspecified: Secondary | ICD-10-CM | POA: Insufficient documentation

## 2017-08-12 DIAGNOSIS — I2510 Atherosclerotic heart disease of native coronary artery without angina pectoris: Secondary | ICD-10-CM | POA: Insufficient documentation

## 2017-08-12 LAB — MYOCARDIAL PERFUSION IMAGING
CHL CUP NUCLEAR SRS: 1
CHL CUP NUCLEAR SSS: 2
LV dias vol: 95 mL (ref 62–150)
LV sys vol: 41 mL
NUC STRESS TID: 1.18
Peak HR: 96 {beats}/min
Rest HR: 55 {beats}/min
SDS: 1

## 2017-08-12 MED ORDER — REGADENOSON 0.4 MG/5ML IV SOLN
0.4000 mg | Freq: Once | INTRAVENOUS | Status: AC
  Administered 2017-08-12: 0.4 mg via INTRAVENOUS

## 2017-08-12 MED ORDER — TECHNETIUM TC 99M TETROFOSMIN IV KIT
30.4000 | PACK | Freq: Once | INTRAVENOUS | Status: AC | PRN
  Administered 2017-08-12: 30.4 via INTRAVENOUS
  Filled 2017-08-12: qty 31

## 2017-08-12 MED ORDER — TECHNETIUM TC 99M TETROFOSMIN IV KIT
10.1000 | PACK | Freq: Once | INTRAVENOUS | Status: AC | PRN
  Administered 2017-08-12: 10.1 via INTRAVENOUS
  Filled 2017-08-12: qty 11

## 2017-08-16 ENCOUNTER — Other Ambulatory Visit: Payer: Self-pay

## 2017-08-16 ENCOUNTER — Ambulatory Visit (HOSPITAL_COMMUNITY): Payer: Medicare Other | Attending: Cardiovascular Disease

## 2017-08-16 DIAGNOSIS — R079 Chest pain, unspecified: Secondary | ICD-10-CM | POA: Insufficient documentation

## 2017-08-16 DIAGNOSIS — Z6835 Body mass index (BMI) 35.0-35.9, adult: Secondary | ICD-10-CM | POA: Insufficient documentation

## 2017-08-16 DIAGNOSIS — E669 Obesity, unspecified: Secondary | ICD-10-CM | POA: Insufficient documentation

## 2017-08-16 DIAGNOSIS — I10 Essential (primary) hypertension: Secondary | ICD-10-CM | POA: Diagnosis not present

## 2017-08-16 DIAGNOSIS — R600 Localized edema: Secondary | ICD-10-CM | POA: Insufficient documentation

## 2017-08-16 DIAGNOSIS — I2510 Atherosclerotic heart disease of native coronary artery without angina pectoris: Secondary | ICD-10-CM | POA: Diagnosis not present

## 2017-08-16 DIAGNOSIS — E785 Hyperlipidemia, unspecified: Secondary | ICD-10-CM | POA: Insufficient documentation

## 2017-08-16 DIAGNOSIS — J849 Interstitial pulmonary disease, unspecified: Secondary | ICD-10-CM | POA: Diagnosis not present

## 2017-08-16 MED ORDER — PERFLUTREN LIPID MICROSPHERE
1.0000 mL | INTRAVENOUS | Status: AC | PRN
  Administered 2017-08-16: 2 mL via INTRAVENOUS

## 2017-08-18 DIAGNOSIS — D649 Anemia, unspecified: Secondary | ICD-10-CM | POA: Diagnosis not present

## 2017-08-18 DIAGNOSIS — I10 Essential (primary) hypertension: Secondary | ICD-10-CM | POA: Diagnosis not present

## 2017-08-18 DIAGNOSIS — E559 Vitamin D deficiency, unspecified: Secondary | ICD-10-CM | POA: Diagnosis not present

## 2017-08-18 DIAGNOSIS — R809 Proteinuria, unspecified: Secondary | ICD-10-CM | POA: Diagnosis not present

## 2017-08-18 DIAGNOSIS — Z79899 Other long term (current) drug therapy: Secondary | ICD-10-CM | POA: Diagnosis not present

## 2017-08-18 DIAGNOSIS — N183 Chronic kidney disease, stage 3 (moderate): Secondary | ICD-10-CM | POA: Diagnosis not present

## 2017-08-24 DIAGNOSIS — E559 Vitamin D deficiency, unspecified: Secondary | ICD-10-CM | POA: Diagnosis not present

## 2017-08-24 DIAGNOSIS — E669 Obesity, unspecified: Secondary | ICD-10-CM | POA: Diagnosis not present

## 2017-08-24 DIAGNOSIS — R809 Proteinuria, unspecified: Secondary | ICD-10-CM | POA: Diagnosis not present

## 2017-08-24 DIAGNOSIS — N183 Chronic kidney disease, stage 3 (moderate): Secondary | ICD-10-CM | POA: Diagnosis not present

## 2017-08-30 ENCOUNTER — Encounter: Payer: Self-pay | Admitting: Cardiovascular Disease

## 2017-08-30 ENCOUNTER — Ambulatory Visit (INDEPENDENT_AMBULATORY_CARE_PROVIDER_SITE_OTHER): Payer: Medicare Other | Admitting: Cardiovascular Disease

## 2017-08-30 VITALS — BP 122/64 | HR 57 | Ht 72.0 in | Wt 250.2 lb

## 2017-08-30 DIAGNOSIS — I2510 Atherosclerotic heart disease of native coronary artery without angina pectoris: Secondary | ICD-10-CM | POA: Diagnosis not present

## 2017-08-30 DIAGNOSIS — I272 Pulmonary hypertension, unspecified: Secondary | ICD-10-CM | POA: Diagnosis not present

## 2017-08-30 DIAGNOSIS — I25119 Atherosclerotic heart disease of native coronary artery with unspecified angina pectoris: Secondary | ICD-10-CM | POA: Diagnosis not present

## 2017-08-30 DIAGNOSIS — E669 Obesity, unspecified: Secondary | ICD-10-CM

## 2017-08-30 DIAGNOSIS — E785 Hyperlipidemia, unspecified: Secondary | ICD-10-CM

## 2017-08-30 DIAGNOSIS — M25473 Effusion, unspecified ankle: Secondary | ICD-10-CM | POA: Diagnosis not present

## 2017-08-30 NOTE — Patient Instructions (Signed)

## 2017-08-30 NOTE — Progress Notes (Signed)
Patient ID: Maurice Munoz, male   DOB: August 20, 1945, 72 y.o.   MRN: [id number]      HPI: Maurice Munoz, is a 72 y.o. male who presents to the office today for a one month cardiology followup evaluation.     Maurice Munoz has established CAD dating back to 1999 when he underwent intervention to his RCA and PDA. In April 2007 he underwent stenting to his LAD and distal RCA at which time Cypher DES stents were inserted. He has a history of hypertension, obesity, hyperlipidemia, gout, and  lower extremity edema.   A two-year followup nuclear perfusion study in March 2014 was essentially unchanged from his prior study and continued to show fairly normal perfusion and was low risk, without evidence for scar. There was no significant ischemia.  In 2015 he had of the Plavix for 3 months due to injury to his right leg.  He has noted some intermittent leg swelling.  He had carotids Doppler study in January 2016 which showed 50-60% left proximal ICA diameter reduction, which had slightly increased from his prior study one year previously.  Because of a history of coughing up blood with pleuritic chest pain he underwent a CT Ango of his chest on March 21 which was negative for PE but showed diffuse peripheral lung densities throughout both upper and lower zones without honeycombing raising the [name] concern for mild fibrotic changes but the pattern was nonspecific.    Over the last several years he has lost over 40 pounds from his peak weight.  He  was seen in the emergency room with lightheadedness and was hypotensive.  He was told to discontinue his isosorbide as well as lisinopril.   In the setting of his hypotension he was found to have an increased creatinine at 1.4 recently had been 1.1. He has been followed by Dr.Befakadu in [place] for his renal issues.  Over the past year, he has experienced occasional episodes of chest pain which he describes as tightness.  Typically, this can occur with  exertion.  At times he also notices a chest burning.  He admits to shortness of breath.  He has been evaluated by [name] for pulmonary fibrosis.  He has an extensive occupational history infrequently had worked in Lobbyist, Engineer, materials, and chrome plating for many years and was often exposed to significant dust inhalation.  He also had worked in Industrial/product designer.  There is remote tobacco history of 2-3 packs per day for 20-25 years, but unfortunately he quit in 1980s.  He is felt most likely to have interstitial lung disease and continues further evaluation.  He has had difficulty with his low back.  He has been on lisinopril 5 mg, metoprolol 50 mg twice a day.  He continues to take simvastatin 20 mg for hyperlipidemia.  He is been maintained on aspirin and Plavix.  There is remote history of gout, which is controlled with allopurinol.  When I last saw him for every 2019, he had noticed episodes of chest burning as well as chest tightness and continues to experience exertional dyspnea.  I initiated amlodipine at 5 mg.  I strongly recommended support stockings for his potential edema.  He was given a prescription for sublabel nitroglycerin.  He underwent a follow-up Leonard study of 08/12/2017 which was low risk and without ischemia.  EF was 57%.  There was minimal apical thinning most likely contributed by his body habitus.  He underwent an echo Doppler study on 10/14/2017 which  showed an EF of 60-65% and grade 2 diastolic dysfunction.  There was moderate pooling hypertension with PA pressure at 46 mm.  He feels better.  He denies recurrent chest pain or shortness of breath.  He is lost 21 pounds in 3 weeks purposefully.  He is in need for additional surgery by Dr. Marcello Moores to improve healing from his previouspilonidal cyst resection.  Past Medical History:  Diagnosis Date  . Back pain   . Bilateral carotid artery stenosis followed by dr Claiborne Billings   per last carotid duplex [id number]  -- proximal LICA 50-03% ,   RICA 0-49%  . CAD (coronary artery disease) cardiologist-  dr t. Claiborne Billings   hx PTCA in 1999 to RCA & PDA/  10-11-2005  DES x2 to [place]  . Chronic constipation   . Chronic pain syndrome   . CKD (chronic kidney disease), stage III ([place])   . DDD (degenerative disc disease), cervical   . DDD (degenerative disc disease), lumbosacral   . Dyspnea on exertion   . Gout    per pt last inflared episode 2015 approx.  . H/O epistaxis    per pt sees ENT dr Benjamine Mola for cauterization (pt takes plavix)  . History of acute pancreatitis 08/08/2014  . History of kidney stones   . HTN (hypertension)   . Hyperlipidemia   . Nocturia   . Numbness of right foot    due to DDD lumbar  . Peripheral edema   . Pilonidal disease   . Pulmonary fibrosis ([place])    followed by pcp-- last chest CT 04-08-2016  . S/P drug eluting coronary stent placement 10/11/2005   mLAD and dRCA  . Wears dentures    upper  . Wears glasses     Past Surgical History:  Procedure Laterality Date  . ANTERIOR CERVICAL DECOMP/DISCECTOMY FUSION  12-25-2001     dr Orinda Kenner [facility]   C3 -- C7  . ANTERIOR CERVICAL DECOMP/DISCECTOMY FUSION  12-11-2012   dr Carloyn Manner  [facility])  . CARDIOVASCULAR STRESS TEST  08-23-2012    dr Claiborne Billings   normal lexiscan nuclear study w/ no ischemia/  normal LV function and wall motion, ef 65%  . CORONARY ANGIOPLASTY  1999   PTCA to RCA & PDA  . CORONARY ANGIOPLASTY WITH STENT PLACEMENT  10-11-2005  dr Shelva Majestic   PCI and stenting to [place] (Cypher DES x2)  . CYSTOSCOPY W/ URETERAL STENT PLACEMENT Left 02-11-2010     [facility]  . LUMBAR LAMINECTOMY  2015   L5 -- S1  . PILONIDAL CYST EXCISION  1980s  . PILONIDAL CYST EXCISION N/A 03/31/2017   Procedure: EXCISION OF PILONIDAL DISEASE with Flap Rotation;  Surgeon: Leighton Ruff, MD;  Location: [place];  Service: General;  Laterality: N/A;  . THROAT SURGERY  1996   per pt "removal piece of cryloid(?) that was pressing against  throat"  states no issues since  . TRANSTHORACIC ECHOCARDIOGRAM  08-24-2010  dr Claiborne Billings   ef 50-55%/  mild MR/  trivial TR    Allergies  Allergen Reactions  . Ibuprofen Itching    Motrin brand  . Neosporin [Neomycin-Bacitracin Zn-Polymyx] Swelling  . Penicillins Rash         Current Outpatient Medications  Medication Sig Dispense Refill  . allopurinol (ZYLOPRIM) 300 MG tablet Take 300 mg by mouth every morning.     Marland Kitchen amLODipine (NORVASC) 5 MG tablet Take 1 tablet (5 mg total) by mouth daily. [place]  tablet 3  . aspirin EC 81 MG tablet Take 81 mg by mouth daily.    . cholecalciferol (VITAMIN D) 1000 units tablet Take 1,000 Units by mouth every morning.     . clopidogrel (PLAVIX) 75 MG tablet TAKE ONE (1) TABLET BY MOUTH EVERY DAY (Patient taking differently: TAKE ONE (1) TABLET BY MOUTH EVERY DAY--- per pt takes every other day (per pt "my doctor knows") 90 tablet 3  . docusate sodium (COLACE) 100 MG capsule Take 100 mg by mouth at bedtime.    Marland Kitchen lisinopril (PRINIVIL,ZESTRIL) 5 MG tablet Take 5 mg by mouth every evening.     . metoprolol tartrate (LOPRESSOR) 50 MG tablet TAKE ONE TABLET BY MOUTH TWICE A DAY 180 tablet 0  . nitroGLYCERIN (NITROSTAT) 0.4 MG SL tablet Place 1 tablet (0.4 mg total) under the tongue every 5 (five) minutes as needed for chest pain. 25 tablet 3  . oxyCODONE-acetaminophen (PERCOCET) 10-325 MG per tablet Take 1 tablet by mouth every 8 (eight) hours as needed for pain.     . predniSONE (DELTASONE) 10 MG tablet Take 4 tabs daily for 6 more days then resume home regimen. (Patient taking differently: Take 10 mg by mouth as needed. Take 4 tabs daily for 6 more days then resume home regimen.prn) 24 tablet o  . simvastatin (ZOCOR) 20 MG tablet TAKE ONE (1) TABLET AT BEDTIME 90 tablet 1  . sodium phosphate (FLEET) 7-19 GM/118ML ENEM Place 1 enema rectally daily as needed for severe constipation.    Marland Kitchen tiZANidine (ZANAFLEX) 4 MG tablet Take 4 mg by mouth at bedtime.     .  torsemide (DEMADEX) 20 MG tablet TAKE ONE (1) TABLET BY MOUTH EVERY DAY (Patient taking differently: TAKE ONE (1) TABLET BY MOUTH EVERY DAY-- per pt takes as needed) 30 tablet 10   No current facility-administered medications for this visit.     Socially he is married. There are no children. Has a remote tobacco history having quit approximately 16 years ago. He's not very active. He does not routinely exercise.  ROS General: Negative; No fevers, chills, or night sweats;  HEENT: Negative; No changes in vision or hearing, sinus congestion, difficulty swallowing Pulmonary: Positive for recent pleuritic chest pain Cardiovascular: See history of present illness GI: Negative; No nausea, vomiting, diarrhea, or abdominal pain GU: Negative; No dysuria, hematuria, or difficulty voiding Musculoskeletal: Negative; no myalgias, joint pain, or weakness Hematologic/Oncology: Negative; no easy bruising, bleeding Endocrine: Negative; no heat/cold intolerance; no diabetes Neuro: Negative; no changes in balance, headaches Skin: Negative; No rashes or skin lesions Psychiatric: Negative; No behavioral problems, depression Sleep: Negative; No snoring, daytime sleepiness, hypersomnolence, bruxism, restless legs, hypnogognic hallucinations, no cataplexy Other comprehensive 14 point system review is negative.   PE BP 122/64   Pulse (!) 57   Ht 6' (1.829 m)   Wt 250 lb 3.2 oz (113.5 kg)   BMI 33.93 kg/m     Repeat blood pressure by me was 134/76  Wt Readings from Last 3 Encounters:  08/30/17 250 lb 3.2 oz (113.5 kg)  08/12/17 260 lb (117.9 kg)  08/03/17 260 lb (117.9 kg)   General: Alert, oriented, no distress.  Skin: normal turgor, no rashes, warm and dry HEENT: Normocephalic, atraumatic. Pupils equal round and reactive to light; sclera anicteric; extraocular muscles intact;  Nose without nasal septal hypertrophy Mouth/Parynx benign; Mallinpatti scale 3 Neck: No JVD, no carotid bruits; normal  carotid upstroke Lungs: clear to ausculatation and percussion; no wheezing or rales  Chest wall: without tenderness to palpitation Heart: PMI not displaced, RRR, s1 s2 normal, 1/6 systolic murmur, no diastolic murmur, no rubs, gallops, thrills, or heaves Abdomen: soft, nontender; no hepatosplenomehaly, BS+; abdominal aorta nontender and not dilated by palpation. Back: no CVA tenderness Pulses 2+ Musculoskeletal: full range of motion, normal strength, no joint deformities Extremities: Marland Kitchen  Mild ankle swelling;no clubbing cyanosis , Homan's sign negative  Neurologic: grossly nonfocal; Cranial nerves grossly wnl Psychologic: Normal mood and affect   ECG (independently read by me): Bradycardia 57 bpm.  No ectopy.  Normal intervals.  February 2018 ECG (independently read by me): Sinus bradycardia 57 bpm.  No ectopy.  Normal intervals.  January 2018 ECG (independently read by me): Sinus rhythm at 60 bpm.  No ectopy.  Normal intervals.  June 2017 ECG (independently read by me): Normal sinus rhythm at 61 bpm.  No ectopy.  Normal intervals.  April 2016 ECG (independently read by me): Normal sinus rhythm at 67 bpm.  No ectopy.  Normal intervals.  October 2014 ECG: Normal sinus rhythm at 63 beats per minute. No significant ST abnormalities. Normal intervals.  LABS:  BMP Latest Ref Rng & Units 03/31/2017 11/24/2015 09/12/2015  Glucose 65 - 99 mg/dL 98 95 108(H)  BUN 6 - 20 mg/dL 16 21 32(H)  Creatinine 0.61 - 1.24 mg/dL 1.20 1.36(H) 1.40(H)  BUN/Creat Ratio 10 - 24 - 15 -  Sodium 135 - 145 mmol/L 141 143 140  Potassium 3.5 - 5.1 mmol/L 4.2 4.7 4.5  Chloride 101 - 111 mmol/L 101 102 104  CO2 18 - 29 mmol/L - 26 29  Calcium 8.6 - 10.2 mg/dL - 9.4 9.1    Hepatic Function Latest Ref Rng & Units 11/24/2015 08/10/2014 08/10/2014  Total Protein 6.0 - 8.5 g/dL 6.5 7.2 7.2  Albumin 3.6 - 4.8 g/dL 4.1 3.4(L) 3.4(L)  AST 0 - 40 IU/L 19 18 18   ALT 0 - 44 IU/L 14 12 13   Alk Phosphatase 39 - 117 IU/L 88 66  66  Total Bilirubin 0.0 - 1.2 mg/dL 1.0 2.3(H) 2.6(H)  Bilirubin, Direct 0.0 - 0.5 mg/dL - 0.4 -   CBC Latest Ref Rng & Units 03/31/2017 09/12/2015 08/08/2014  WBC 4.0 - 10.5 K/uL - 8.4 10.5  Hemoglobin 13.0 - 17.0 g/dL 14.3 14.9 14.6  Hematocrit 39.0 - 52.0 % 42.0 44.9 43.7  Platelets 150 - 400 K/uL - 199 217   Lab Results  Component Value Date   MCV 85.7 09/12/2015   MCV 87.8 08/08/2014   MCV 86.5 02/12/2010    Lab Results  Component Value Date   TSH 1.150 11/24/2015   Lipid Panel     Component Value Date/Time   CHOL 144 11/24/2015 1127   TRIG 131 11/24/2015 1127   HDL 39 (L) 11/24/2015 1127   CHOLHDL 4.2 08/10/2014 0555   VLDL 24 08/10/2014 0555   LDLCALC 79 11/24/2015 1127     RADIOLOGY: No results found.  IMPRESSION:  1. Coronary artery disease involving native coronary artery of native heart with angina pectoris (HCC)   2. Obesity (BMI 30-39.9)   3. Hyperlipidemia LDL goal <70   4. Ankle swelling, unspecified laterality   5. Pulmonary hypertension ([place])      ASSESSMENT AND PLAN: Mr. Kreider is a 72 year old Caucasian male who is almost 19 years since his initial intervention to his RCA and PDA and 11 years since stenting to his LAD and distal RCA.  He has a history of hypertension.   Due  to his significant occupational exposure, remote tobacco, and abnormal PFTs and CT imaging he is felt most likely to have interstitial lung disease and is undergoing further evaluation by [name]. , when I last saw him, he had experienced an episode of chest pain with burning.  I reviewed his nuclear stress test as well as echo Doppler study.  His stress test is unchanged from 2004 and remains low risk without ischemia with only mild diaphragmatic attenuation induced apical thinning.  His echo Doppler study shows increased.  PA pressure at 46 mm.  There was normal.  Systolic function with grade 2 diastolic dysfunction.  He has a history of obesity and has lost 21 pounds over the  past 3 weeks.  Based on his recent stress test and echo findings, he will be given clearance to undergo his redo surgery by Dr. Marcello Moores.  I suspect he will need to hold Plavix for 5 days prior to this procedure.  His blood pressure today is stable on amlodipine 5 mg, lisinopril 5 mg, metoprolol 50 Milligan's twice a day in addition to his torsemide.  Peripheral edema is controlled although mild ankle swelling persists.  He has not had recent gout and continues to be on allopurinol.  I will see Ms. 6 months for reevaluation.  Time spent: 25 minutes  Troy Sine, MD, [facility]  09/01/2017 7:16 PM

## 2017-09-01 ENCOUNTER — Encounter: Payer: Self-pay | Admitting: Cardiovascular Disease

## 2017-09-01 DIAGNOSIS — T8131XD Disruption of external operation (surgical) wound, not elsewhere classified, subsequent encounter: Secondary | ICD-10-CM | POA: Diagnosis not present

## 2017-09-07 DIAGNOSIS — Z79899 Other long term (current) drug therapy: Secondary | ICD-10-CM | POA: Diagnosis not present

## 2017-09-07 DIAGNOSIS — N183 Chronic kidney disease, stage 3 (moderate): Secondary | ICD-10-CM | POA: Diagnosis not present

## 2017-09-07 DIAGNOSIS — I10 Essential (primary) hypertension: Secondary | ICD-10-CM | POA: Diagnosis not present

## 2017-09-16 ENCOUNTER — Other Ambulatory Visit (HOSPITAL_COMMUNITY): Payer: Self-pay | Admitting: Cardiovascular Disease

## 2017-09-16 NOTE — Telephone Encounter (Signed)
REFILL 

## 2017-09-20 ENCOUNTER — Ambulatory Visit: Payer: Self-pay | Admitting: General Surgery

## 2017-09-20 DIAGNOSIS — T8131XD Disruption of external operation (surgical) wound, not elsewhere classified, subsequent encounter: Secondary | ICD-10-CM | POA: Diagnosis not present

## 2017-09-20 NOTE — H&P (Signed)
History of Present Illness Maurice Ruff MD; 12/20/5364 11:11 AM) The patient is a 72 year old male who presents with a pilonidal cyst. 72 year old male who presents to the office for a pilonidal wound. He states that is been present for couple years. He does have a history of pilonidal surgery approximately 40 years ago. He reports that it becomes painful and drains every few weeks. It opened up to a larger sore several months ago and he was referred to the wound clinic for evaluation. They treated the external lesion but he continues to have pain in the area. He takes prednisone occasionally for gout and back pain. He denies any smoking or history of diabetes.  He was taken to the operating room in early October 2018 and a rotational flap was performed. He presented to the office for nurse visit for drain removal. His wound appeared erythematous and he was placed on antibiotics and a drain was left in place. He returned to the office one week later and his drain was removed. The medial edge of his wound had dehisced. He has been packing the wound daily. The wound has not decreased in size and he continues to have pain in the area.     Problem List/Past Medical Maurice Ruff, MD; 09/22/345 11:12 AM) PILONIDAL DISEASE (L98.8) PILONIDAL ABSCESS (L05.01) POSTOPERATIVE DEHISCENCE OF SKIN WOUND, SUBSEQUENT ENCOUNTER (T81.31XD)  Past Surgical History Maurice Ruff, MD; 09/21/5954 11:12 AM) Spinal Surgery - Lower Back Spinal Surgery - Neck  Diagnostic Studies History Maurice Ruff, MD; 08/27/7562 11:12 AM) Colonoscopy >10 years ago  Allergies Levonne Spiller, [place]; 09/20/2017 11:00 AM) Penicillins Motrin *ANALGESICS - ANTI-INFLAMMATORY* Neosporin + Pain/Itch/Scar *DERMATOLOGICALS* Allergies Reconciled  Medication History (Danielle Gerrigner, CMA; 09/20/2017 11:00 AM) Oxycodone-Acetaminophen (10-325MG  Tablet, Oral) Active. AmLODIPine Besylate (5MG  Tablet, Oral)  Active. LevoFLOXacin (500MG  Tablet, Oral) Active. Nitroglycerin (0.4MG  Tab Sublingual, Sublingual) Active. TiZANidine HCl (4MG  Tablet, Oral) Active. Metoprolol Tartrate (50MG  Tablet, Oral) Active. Lisinopril (5MG  Tablet, Oral) Active. PredniSONE (10MG  Tablet, Oral) Active. Simvastatin (20MG  Tablet, Oral) Active. Plavix (75MG  Tablet, Oral) Active. Allopurinol (300MG  Tablet, Oral) Active. Medications Reconciled  Social History Maurice Ruff, MD; 08/21/2949 11:12 AM) Alcohol use Remotely quit alcohol use. Caffeine use Coffee. No drug use Tobacco use Former smoker.  Family History Maurice Ruff, MD; 01/26/4165 11:12 AM) Diabetes Mellitus Brother, Sister. Heart Disease Brother. Respiratory Condition Father.  Other Problems Maurice Ruff, MD; 0/11/3014 11:12 AM) Hypercholesterolemia     Review of Systems Maurice Ruff MD; 0/06/930 11:12 AM) General Not Present- Appetite Loss, Chills, Fatigue, Fever, Night Sweats, Weight Gain and Weight Loss. Skin Present- Non-Healing Wounds. Not Present- Change in Wart/Mole, Dryness, Hives, Jaundice, New Lesions, Rash and Ulcer. HEENT Present- Nose Bleed, Sore Throat and Wears glasses/contact lenses. Not Present- Earache, Hearing Loss, Hoarseness, Oral Ulcers, Ringing in the Ears, Seasonal Allergies, Sinus Pain, Visual Disturbances and Yellow Eyes. Respiratory Present- Bloody sputum. Not Present- Chronic Cough, Difficulty Breathing, Snoring and Wheezing. Breast Not Present- Breast Mass, Breast Pain, Nipple Discharge and Skin Changes. Cardiovascular Present- Leg Cramps and Swelling of Extremities. Not Present- Chest Pain, Difficulty Breathing Lying Down, Palpitations, Rapid Heart Rate and Shortness of Breath. Gastrointestinal Present- Constipation. Not Present- Abdominal Pain, Bloating, Bloody Stool, Change in Bowel Habits, Chronic diarrhea, Difficulty Swallowing, Excessive gas, Gets full quickly at meals, Hemorrhoids, Indigestion,  Nausea, Rectal Pain and Vomiting. Male Genitourinary Not Present- Blood in Urine, Change in Urinary Stream, Frequency, Impotence, Nocturia, Painful Urination, Urgency and Urine Leakage. Musculoskeletal Present- Back Pain. Not Present- Joint Pain,  Joint Stiffness, Muscle Pain, Muscle Weakness and Swelling of Extremities. Neurological Not Present- Decreased Memory, Fainting, Headaches, Numbness, Seizures, Tingling, Tremor, Trouble walking and Weakness. Psychiatric Not Present- Anxiety, Bipolar, Change in Sleep Pattern, Depression, Fearful and Frequent crying. Endocrine Not Present- Cold Intolerance, Excessive Hunger, Hair Changes, Heat Intolerance and New Diabetes. Hematology Present- Blood Thinners and Easy Bruising. Not Present- Excessive bleeding, Gland problems, HIV and Persistent Infections.  Vitals (Danielle Gerrigner CMA; 09/20/2017 11:00 AM) 09/20/2017 11:00 AM Weight: 253.13 lb Height: 73in Body Surface Area: 2.38 m Body Mass Index: 33.4 kg/m  Temp.: 98.90F(Tympanic)  Pulse: 86 (Regular)  BP: 134/80 (Sitting, Left Arm, Standard)      Physical Exam Maurice Ruff MD; 09/20/7060 11:12 AM)  General Mental Status-Alert. General Appearance-Not in acute distress. Build & Nutrition-Well nourished. Posture-Normal posture. Gait-Normal.  Head and Neck Head-normocephalic, atraumatic with no lesions or palpable masses. Trachea-midline.  Chest and Lung Exam Chest and lung exam reveals -on auscultation, normal breath sounds, no adventitious sounds and normal vocal resonance.  Cardiovascular Cardiovascular examination reveals -normal heart sounds, regular rate and rhythm with no murmurs and no digital clubbing, cyanosis, edema, increased warmth or tenderness.  Abdomen Inspection Inspection of the abdomen reveals - No Hernias. Palpation/Percussion Palpation and Percussion of the abdomen reveal - Soft, Non Tender, No Rigidity (guarding), No  hepatosplenomegaly and No Palpable abdominal masses.  Rectal Note: post midline wound. no change in size. no sign of cellulitis  Neurologic Neurologic evaluation reveals -alert and oriented x 3 with no impairment of recent or remote memory, normal attention span and ability to concentrate, normal sensation and normal coordination.  Musculoskeletal Normal Exam - Bilateral-Upper Extremity Strength Normal and Lower Extremity Strength Normal.    Assessment & Plan Maurice Ruff MD; 08/25/6281 11:09 AM)  POSTOPERATIVE DEHISCENCE OF SKIN WOUND, SUBSEQUENT ENCOUNTER (T81.31XD) Impression: Patient continues to have a chronic wound. This does not appear to be healing by secondary intention. I have suggested inserting a dermal wound healing matrix in the operating room to assist with his healing. We discussed that there still is a risk of failure of the wound epithelialized but I think this is his best chance given the chronicity of his wound and the lack of blood supply to the area.

## 2017-09-20 NOTE — H&P (View-Only) (Signed)
History of Present Illness Leighton Ruff MD; 11/21/7856 11:11 AM) The patient is a 72 year old male who presents with a pilonidal cyst. 72 year old male who presents to the office for a pilonidal wound. He states that is been present for couple years. He does have a history of pilonidal surgery approximately 40 years ago. He reports that it becomes painful and drains every few weeks. It opened up to a larger sore several months ago and he was referred to the wound clinic for evaluation. They treated the external lesion but he continues to have pain in the area. He takes prednisone occasionally for gout and back pain. He denies any smoking or history of diabetes.  He was taken to the operating room in early October 2018 and a rotational flap was performed. He presented to the office for nurse visit for drain removal. His wound appeared erythematous and he was placed on antibiotics and a drain was left in place. He returned to the office one week later and his drain was removed. The medial edge of his wound had dehisced. He has been packing the wound daily. The wound has not decreased in size and he continues to have pain in the area.     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 AmLODIPine Besylate (5MG  Tablet, Oral)  Active. LevoFLOXacin (500MG  Tablet, Oral) Active. Nitroglycerin (0.4MG  Tab Sublingual, Sublingual) Active. TiZANidine HCl (4MG  Tablet, Oral) Active. Metoprolol Tartrate (50MG  Tablet, Oral) Active. Lisinopril (5MG  Tablet, Oral) Active. PredniSONE (10MG  Tablet, Oral) Active. Simvastatin (20MG  Tablet, Oral) Active. Plavix (75MG  Tablet, Oral) Active. Allopurinol (300MG  Tablet, Oral) Active. Medications Reconciled  Social History Leighton Ruff, MD; 02/23/7095 11:12 AM) Alcohol use Remotely quit alcohol use. Caffeine use Coffee. No drug use Tobacco use Former smoker.  Family History Leighton Ruff, MD; 07/29/3660 11:12 AM) Diabetes Mellitus Brother, Sister. Heart Disease Brother. Respiratory Condition Father.  Other Problems Leighton Ruff, MD; 02/22/7653 11:12 AM) Hypercholesterolemia     Review of Systems Leighton Ruff MD; 11/23/352 11:12 AM) General Not Present- Appetite Loss, Chills, Fatigue, Fever, Night Sweats, Weight Gain and Weight Loss. Skin Present- Non-Healing Wounds. Not Present- Change in Wart/Mole, Dryness, Hives, Jaundice, New Lesions, Rash and Ulcer. HEENT Present- Nose Bleed, Sore Throat and Wears glasses/contact lenses. Not Present- Earache, Hearing Loss, Hoarseness, Oral Ulcers, Ringing in the Ears, Seasonal Allergies, Sinus Pain, Visual Disturbances and Yellow Eyes. Respiratory Present- Bloody sputum. Not Present- Chronic Cough, Difficulty Breathing, Snoring and Wheezing. Breast Not Present- Breast Mass, Breast Pain, Nipple Discharge and Skin Changes. Cardiovascular Present- Leg Cramps and Swelling of Extremities. Not Present- Chest Pain, Difficulty Breathing Lying Down, Palpitations, Rapid Heart Rate and Shortness of Breath. Gastrointestinal Present- Constipation. Not Present- Abdominal Pain, Bloating, Bloody Stool, Change in Bowel Habits, Chronic diarrhea, Difficulty Swallowing, Excessive gas, Gets full quickly at meals, Hemorrhoids, Indigestion,  Nausea, Rectal Pain and Vomiting. Male Genitourinary Not Present- Blood in Urine, Change in Urinary Stream, Frequency, Impotence, Nocturia, Painful Urination, Urgency and Urine Leakage. Musculoskeletal Present- Back Pain. Not Present- Joint Pain,  Joint Stiffness, Muscle Pain, Muscle Weakness and Swelling of Extremities. Neurological Not Present- Decreased Memory, Fainting, Headaches, Numbness, Seizures, Tingling, Tremor, Trouble walking and Weakness. Psychiatric Not Present- Anxiety, Bipolar, Change in Sleep Pattern, Depression, Fearful and Frequent crying. Endocrine Not Present- Cold Intolerance, Excessive Hunger, Hair Changes, Heat Intolerance and New Diabetes. Hematology Present- Blood Thinners and Easy Bruising. Not Present- Excessive bleeding, Gland problems, HIV and Persistent Infections.  Vitals (Danielle Gerrigner CMA; 09/20/2017 11:00 AM) 09/20/2017 11:00 AM Weight: 253.13 lb Height: 73in Body Surface Area: 2.38 m Body Mass Index: 33.4 kg/m  Temp.: 98.51F(Tympanic)  Pulse: 86 (Regular)  BP: 134/80 (Sitting, Left Arm, Standard)      Physical Exam Leighton Ruff MD; 08/24/5730 11:12 AM)  General Mental Status-Alert. General Appearance-Not in acute distress. Build & Nutrition-Well nourished. Posture-Normal posture. Gait-Normal.  Head and Neck Head-normocephalic, atraumatic with no lesions or palpable masses. Trachea-midline.  Chest and Lung Exam Chest and lung exam reveals -on auscultation, normal breath sounds, no adventitious sounds and normal vocal resonance.  Cardiovascular Cardiovascular examination reveals -normal heart sounds, regular rate and rhythm with no murmurs and no digital clubbing, cyanosis, edema, increased warmth or tenderness.  Abdomen Inspection Inspection of the abdomen reveals - No Hernias. Palpation/Percussion Palpation and Percussion of the abdomen reveal - Soft, Non Tender, No Rigidity (guarding), No  hepatosplenomegaly and No Palpable abdominal masses.  Rectal Note: post midline wound. no change in size. no sign of cellulitis  Neurologic Neurologic evaluation reveals -alert and oriented x 3 with no impairment of recent or remote memory, normal attention span and ability to concentrate, normal sensation and normal coordination.  Musculoskeletal Normal Exam - Bilateral-Upper Extremity Strength Normal and Lower Extremity Strength Normal.    Assessment & Plan Leighton Ruff MD; 2/0/2542 11:09 AM)  POSTOPERATIVE DEHISCENCE OF SKIN WOUND, SUBSEQUENT ENCOUNTER (T81.31XD) Impression: Patient continues to have a chronic wound. This does not appear to be healing by secondary intention. I have suggested inserting a dermal wound healing matrix in the operating room to assist with his healing. We discussed that there still is a risk of failure of the wound epithelialized but I think this is his best chance given the chronicity of his wound and the lack of blood supply to the area.

## 2017-09-24 ENCOUNTER — Other Ambulatory Visit: Payer: Self-pay | Admitting: Cardiovascular Disease

## 2017-09-27 ENCOUNTER — Encounter (HOSPITAL_BASED_OUTPATIENT_CLINIC_OR_DEPARTMENT_OTHER): Payer: Self-pay

## 2017-09-27 NOTE — Pre-Procedure Instructions (Addendum)
I spoke with Dr. Lissa Hoard to make sure Maurice Munoz could proceed with his surgery at the [facility] due to his mild pulmonary hypertension noted on ECHO 08/16/2017.  Dr. Lissa Hoard reviewed history and he believes patient is good to proceed with surgery at the [facility].  Mr. Yawn was last seen by Dr. Claiborne Billings 08/2017 no further cardiac clearance needed for this procedure per Dr. Lissa Hoard.

## 2017-09-29 ENCOUNTER — Encounter (HOSPITAL_BASED_OUTPATIENT_CLINIC_OR_DEPARTMENT_OTHER): Payer: Self-pay

## 2017-09-29 ENCOUNTER — Other Ambulatory Visit: Payer: Self-pay

## 2017-09-29 NOTE — Progress Notes (Signed)
Spoke with:  Kyung Rudd NPO:  After Midnight, no gum, candy, or mints   Arrival time: 0900 Labs:  Istat 8 AM medications:  Allopurinol, Amlodipine, Metoprolol Pre op orders:  Yes Ride home:  Shauna Hugh (wife) [phone]

## 2017-10-06 ENCOUNTER — Ambulatory Visit (HOSPITAL_BASED_OUTPATIENT_CLINIC_OR_DEPARTMENT_OTHER)
Admission: RE | Admit: 2017-10-06 | Discharge: 2017-10-06 | Disposition: A | Discharge status: Home / Self Care | Payer: Medicare Other | Source: Ambulatory Visit | Attending: General Surgery | Admitting: General Surgery

## 2017-10-06 ENCOUNTER — Encounter (HOSPITAL_BASED_OUTPATIENT_CLINIC_OR_DEPARTMENT_OTHER)
Admission: RE | Disposition: A | Discharge status: Home / Self Care | Payer: Self-pay | Source: Ambulatory Visit | Attending: General Surgery

## 2017-10-06 ENCOUNTER — Ambulatory Visit (HOSPITAL_BASED_OUTPATIENT_CLINIC_OR_DEPARTMENT_OTHER): Payer: Medicare Other | Admitting: Anesthesiology

## 2017-10-06 ENCOUNTER — Encounter (HOSPITAL_BASED_OUTPATIENT_CLINIC_OR_DEPARTMENT_OTHER): Payer: Self-pay

## 2017-10-06 DIAGNOSIS — Y838 Other surgical procedures as the cause of abnormal reaction of the patient, or of later complication, without mention of misadventure at the time of the procedure: Secondary | ICD-10-CM | POA: Insufficient documentation

## 2017-10-06 DIAGNOSIS — L0501 Pilonidal cyst with abscess: Secondary | ICD-10-CM | POA: Diagnosis not present

## 2017-10-06 DIAGNOSIS — I2510 Atherosclerotic heart disease of native coronary artery without angina pectoris: Secondary | ICD-10-CM | POA: Diagnosis not present

## 2017-10-06 DIAGNOSIS — T8131XD Disruption of external operation (surgical) wound, not elsewhere classified, subsequent encounter: Secondary | ICD-10-CM | POA: Diagnosis not present

## 2017-10-06 DIAGNOSIS — M109 Gout, unspecified: Secondary | ICD-10-CM | POA: Diagnosis not present

## 2017-10-06 DIAGNOSIS — Y929 Unspecified place or not applicable: Secondary | ICD-10-CM | POA: Insufficient documentation

## 2017-10-06 DIAGNOSIS — E785 Hyperlipidemia, unspecified: Secondary | ICD-10-CM | POA: Diagnosis not present

## 2017-10-06 DIAGNOSIS — I10 Essential (primary) hypertension: Secondary | ICD-10-CM | POA: Diagnosis not present

## 2017-10-06 HISTORY — DX: Cardiomegaly: I51.7

## 2017-10-06 HISTORY — PX: WOUND DEBRIDEMENT: SHX247

## 2017-10-06 HISTORY — DX: Pleural effusion, not elsewhere classified: J90

## 2017-10-06 HISTORY — DX: Benign neoplasm of left adrenal gland: D35.02

## 2017-10-06 HISTORY — DX: Dyspnea, unspecified: R06.00

## 2017-10-06 HISTORY — DX: Pilonidal cyst without abscess: L05.91

## 2017-10-06 HISTORY — DX: Interstitial pulmonary disease, unspecified: J84.9

## 2017-10-06 HISTORY — DX: Pain in left arm: M79.602

## 2017-10-06 HISTORY — DX: Heart disease, unspecified: I51.9

## 2017-10-06 HISTORY — DX: Other ill-defined heart diseases: I51.89

## 2017-10-06 HISTORY — DX: Atherosclerosis of aorta: I70.0

## 2017-10-06 LAB — POCT I-STAT, CHEM 8
BUN: 21 mg/dL — ABNORMAL HIGH (ref 6–20)
CHLORIDE: 104 mmol/L (ref 101–111)
CREATININE: 1 mg/dL (ref 0.61–1.24)
Calcium, Ion: 1.27 mmol/L (ref 1.15–1.40)
Glucose, Bld: 92 mg/dL (ref 65–99)
HEMATOCRIT: 43 % (ref 39.0–52.0)
HEMOGLOBIN: 14.6 g/dL (ref 13.0–17.0)
POTASSIUM: 4.2 mmol/L (ref 3.5–5.1)
Sodium: 142 mmol/L (ref 135–145)
TCO2: 27 mmol/L (ref 22–32)

## 2017-10-06 SURGERY — DEBRIDEMENT, WOUND
Anesthesia: Monitor Anesthesia Care

## 2017-10-06 MED ORDER — GABAPENTIN 300 MG PO CAPS
300.0000 mg | ORAL_CAPSULE | ORAL | Status: AC
  Administered 2017-10-06: 300 mg via ORAL
  Filled 2017-10-06: qty 1

## 2017-10-06 MED ORDER — PROPOFOL 10 MG/ML IV BOLUS
INTRAVENOUS | Status: DC | PRN
  Administered 2017-10-06: 30 mg via INTRAVENOUS

## 2017-10-06 MED ORDER — FENTANYL CITRATE (PF) 100 MCG/2ML IJ SOLN
25.0000 ug | INTRAMUSCULAR | Status: DC | PRN
  Filled 2017-10-06: qty 1

## 2017-10-06 MED ORDER — OXYCODONE HCL 5 MG/5ML PO SOLN
5.0000 mg | Freq: Once | ORAL | Status: DC | PRN
  Filled 2017-10-06: qty 5

## 2017-10-06 MED ORDER — SODIUM CHLORIDE 0.9% FLUSH
3.0000 mL | INTRAVENOUS | Status: DC | PRN
  Filled 2017-10-06: qty 3

## 2017-10-06 MED ORDER — ACETAMINOPHEN 500 MG PO TABS
ORAL_TABLET | ORAL | Status: AC
  Filled 2017-10-06: qty 2

## 2017-10-06 MED ORDER — PROPOFOL 500 MG/50ML IV EMUL
INTRAVENOUS | Status: DC | PRN
  Administered 2017-10-06: 200 ug/kg/min via INTRAVENOUS

## 2017-10-06 MED ORDER — ACETAMINOPHEN 325 MG PO TABS
650.0000 mg | ORAL_TABLET | ORAL | Status: DC | PRN
  Filled 2017-10-06: qty 2

## 2017-10-06 MED ORDER — FENTANYL CITRATE (PF) 100 MCG/2ML IJ SOLN
INTRAMUSCULAR | Status: AC
  Filled 2017-10-06: qty 2

## 2017-10-06 MED ORDER — SODIUM CHLORIDE 0.9 % IV SOLN
250.0000 mL | INTRAVENOUS | Status: DC | PRN
  Filled 2017-10-06: qty 250

## 2017-10-06 MED ORDER — MIDAZOLAM HCL 2 MG/2ML IJ SOLN
INTRAMUSCULAR | Status: AC
  Filled 2017-10-06: qty 2

## 2017-10-06 MED ORDER — OXYCODONE HCL 5 MG PO TABS
5.0000 mg | ORAL_TABLET | ORAL | Status: DC | PRN
  Filled 2017-10-06: qty 2

## 2017-10-06 MED ORDER — BUPIVACAINE-EPINEPHRINE 0.5% -1:200000 IJ SOLN
INTRAMUSCULAR | Status: DC | PRN
  Administered 2017-10-06: 30 mL

## 2017-10-06 MED ORDER — FENTANYL CITRATE (PF) 100 MCG/2ML IJ SOLN
INTRAMUSCULAR | Status: DC | PRN
  Administered 2017-10-06: 50 ug via INTRAVENOUS

## 2017-10-06 MED ORDER — ACETAMINOPHEN 650 MG RE SUPP
650.0000 mg | RECTAL | Status: DC | PRN
  Filled 2017-10-06: qty 1

## 2017-10-06 MED ORDER — ACETAMINOPHEN 500 MG PO TABS
1000.0000 mg | ORAL_TABLET | ORAL | Status: AC
  Administered 2017-10-06: 1000 mg via ORAL
  Filled 2017-10-06: qty 2

## 2017-10-06 MED ORDER — OXYCODONE HCL 5 MG PO TABS
5.0000 mg | ORAL_TABLET | Freq: Once | ORAL | Status: DC | PRN
  Filled 2017-10-06: qty 1

## 2017-10-06 MED ORDER — MIDAZOLAM HCL 2 MG/2ML IJ SOLN
INTRAMUSCULAR | Status: DC | PRN
  Administered 2017-10-06: 1 mg via INTRAVENOUS

## 2017-10-06 MED ORDER — PROPOFOL 500 MG/50ML IV EMUL
INTRAVENOUS | Status: AC
  Filled 2017-10-06: qty 50

## 2017-10-06 MED ORDER — PROMETHAZINE HCL 25 MG/ML IJ SOLN
6.2500 mg | INTRAMUSCULAR | Status: DC | PRN
  Filled 2017-10-06: qty 1

## 2017-10-06 MED ORDER — GABAPENTIN 300 MG PO CAPS
ORAL_CAPSULE | ORAL | Status: AC
  Filled 2017-10-06: qty 1

## 2017-10-06 MED ORDER — SODIUM CHLORIDE 0.9 % IV SOLN
INTRAVENOUS | Status: DC
  Administered 2017-10-06: 1000 mL via INTRAVENOUS
  Filled 2017-10-06: qty 1000

## 2017-10-06 MED ORDER — SODIUM CHLORIDE 0.9% FLUSH
3.0000 mL | Freq: Two times a day (BID) | INTRAVENOUS | Status: DC
  Filled 2017-10-06: qty 3

## 2017-10-06 SURGICAL SUPPLY — 58 items
ADH SKN CLS APL DERMABOND .7 (GAUZE/BANDAGES/DRESSINGS) ×1
APL SKNCLS STERI-STRIP NONHPOA (GAUZE/BANDAGES/DRESSINGS) ×1
BENZOIN TINCTURE PRP APPL 2/3 (GAUZE/BANDAGES/DRESSINGS) ×3 IMPLANT
BLADE EXTENDED COATED 6.5IN (ELECTRODE) IMPLANT
BLADE HEX COATED 2.75 (ELECTRODE) ×3 IMPLANT
BLADE SURG 10 STRL SS (BLADE) IMPLANT
BLADE SURG 15 STRL LF DISP TIS (BLADE) ×1 IMPLANT
BLADE SURG 15 STRL SS (BLADE) ×3
BRIEF STRETCH FOR OB PAD LRG (UNDERPADS AND DIAPERS) ×3 IMPLANT
CANISTER SUCT 3000ML PPV (MISCELLANEOUS) ×3 IMPLANT
COVER BACK TABLE 60X90IN (DRAPES) ×3 IMPLANT
COVER MAYO STAND STRL (DRAPES) ×3 IMPLANT
DECANTER SPIKE VIAL GLASS SM (MISCELLANEOUS) ×3 IMPLANT
DERMABOND ADVANCED (GAUZE/BANDAGES/DRESSINGS) ×2
DERMABOND ADVANCED .7 DNX12 (GAUZE/BANDAGES/DRESSINGS) ×1 IMPLANT
DRAIN PENROSE 18X1/4 LTX STRL (WOUND CARE) ×3 IMPLANT
DRAPE LAPAROTOMY 100X72 PEDS (DRAPES) ×3 IMPLANT
DRAPE LG THREE QUARTER DISP (DRAPES) IMPLANT
DRAPE UTILITY XL STRL (DRAPES) ×3 IMPLANT
DRSG EMULSION OIL 3X3 NADH (GAUZE/BANDAGES/DRESSINGS) ×2 IMPLANT
DRSG TEGADERM 4X4.75 (GAUZE/BANDAGES/DRESSINGS) ×2 IMPLANT
ELECT REM PT RETURN 9FT ADLT (ELECTROSURGICAL) ×3
ELECTRODE REM PT RTRN 9FT ADLT (ELECTROSURGICAL) ×1 IMPLANT
GAUZE SPONGE 4X4 12PLY STRL (GAUZE/BANDAGES/DRESSINGS) ×2 IMPLANT
GAUZE SPONGE 4X4 16PLY NS LF (WOUND CARE) IMPLANT
GAUZE SPONGE 4X4 16PLY XRAY LF (GAUZE/BANDAGES/DRESSINGS) IMPLANT
GAUZE VASELINE 3X9 (GAUZE/BANDAGES/DRESSINGS) IMPLANT
GLOVE BIO SURGEON STRL SZ 6.5 (GLOVE) ×2 IMPLANT
GLOVE BIO SURGEONS STRL SZ 6.5 (GLOVE) ×1
GLOVE INDICATOR 7.0 STRL GRN (GLOVE) ×3 IMPLANT
GOWN STRL REUS W/ TWL LRG LVL3 (GOWN DISPOSABLE) ×1 IMPLANT
GOWN STRL REUS W/TWL 2XL LVL3 (GOWN DISPOSABLE) ×3 IMPLANT
GOWN STRL REUS W/TWL LRG LVL3 (GOWN DISPOSABLE) ×3
IMPL STRAVIX 2X4 (Tissue) IMPLANT
IMPLANT STRAVIX 2X4 (Tissue) ×3 IMPLANT
KIT TURNOVER CYSTO (KITS) ×3 IMPLANT
NDL SAFETY ECLIPSE 18X1.5 (NEEDLE) IMPLANT
NEEDLE HYPO 18GX1.5 SHARP (NEEDLE)
NEEDLE HYPO 22GX1.5 SAFETY (NEEDLE) ×3 IMPLANT
NS IRRIG 500ML POUR BTL (IV SOLUTION) ×3 IMPLANT
PACK BASIN DAY SURGERY FS (CUSTOM PROCEDURE TRAY) ×3 IMPLANT
PAD ABD 8X10 STRL (GAUZE/BANDAGES/DRESSINGS) ×3 IMPLANT
PAD ARMBOARD 7.5X6 YLW CONV (MISCELLANEOUS) ×3 IMPLANT
PENCIL BUTTON HOLSTER BLD 10FT (ELECTRODE) ×3 IMPLANT
SPONGE LAP 18X18 X RAY DECT (DISPOSABLE) IMPLANT
SPONGE LAP 4X18 X RAY DECT (DISPOSABLE) IMPLANT
SPONGE SURGIFOAM ABS GEL 12-7 (HEMOSTASIS) IMPLANT
SUT ETHILON 2 0 PS N (SUTURE) ×3 IMPLANT
SUT VIC AB 2-0 SH 27 (SUTURE) ×3
SUT VIC AB 2-0 SH 27XBRD (SUTURE) ×1 IMPLANT
SUT VIC AB 3-0 SH 18 (SUTURE) ×3 IMPLANT
SYR BULB IRRIGATION 50ML (SYRINGE) ×3 IMPLANT
SYR CONTROL 10ML LL (SYRINGE) ×3 IMPLANT
TOWEL OR 17X24 6PK STRL BLUE (TOWEL DISPOSABLE) ×6 IMPLANT
TRAY DSU PREP LF (CUSTOM PROCEDURE TRAY) ×3 IMPLANT
TUBE CONNECTING 12'X1/4 (SUCTIONS) ×1
TUBE CONNECTING 12X1/4 (SUCTIONS) ×2 IMPLANT
YANKAUER SUCT BULB TIP NO VENT (SUCTIONS) ×3 IMPLANT

## 2017-10-06 NOTE — Transfer of Care (Signed)
Immediate Anesthesia Transfer of Care Note  Patient: Maurice Munoz  Procedure(s) Performed: DEBRIDEMENT WOUND PLACEMENT OF WOUND HEALING MATRIX (N/A )  Patient Location: PACU  Anesthesia Type:MAC  Level of Consciousness: awake, alert , oriented and patient cooperative  Airway & Oxygen Therapy: Patient Spontanous Breathing and Patient connected to nasal cannula oxygen  Post-op Assessment: Report given to RN and Post -op Vital signs reviewed and stable  Post vital signs: Reviewed and stable  Last Vitals:  Vitals Value Taken Time  BP    Temp    Pulse 62 10/06/2017 11:43 AM  Resp 41 10/06/2017 11:43 AM  SpO2 99 % 10/06/2017 11:43 AM  Vitals shown include unvalidated device data.  Last Pain:  Vitals:   10/06/17 0952  TempSrc:   PainSc: 8       Patients Stated Pain Goal: 10 (54/00/86 7619)  Complications: No apparent anesthesia complications

## 2017-10-06 NOTE — Anesthesia Postprocedure Evaluation (Signed)
Anesthesia Post Note  Patient: Maurice Munoz  Procedure(s) Performed: DEBRIDEMENT WOUND PLACEMENT OF WOUND HEALING MATRIX (N/A )     Patient location during evaluation: PACU Anesthesia Type: MAC Level of consciousness: awake and alert Pain management: pain level controlled Vital Signs Assessment: post-procedure vital signs reviewed and stable Respiratory status: spontaneous breathing, nonlabored ventilation, respiratory function stable and patient connected to nasal cannula oxygen Cardiovascular status: stable and blood pressure returned to baseline Postop Assessment: no apparent nausea or vomiting Anesthetic complications: no    Last Vitals:  Vitals:   10/06/17 0903 10/06/17 1141  BP: 140/73   Pulse: 70 (!) 56  Resp: 18 16  Temp: 36.5 C 36.6 C  SpO2: 97% 100%    Last Pain:  Vitals:   10/06/17 1150  TempSrc:   PainSc: 3                  [name]

## 2017-10-06 NOTE — Interval H&P Note (Signed)
History and Physical Interval Note:  10/06/2017 9:25 AM  Maurice Munoz  has presented today for surgery, with the diagnosis of non-healing pilonidal wound  The various methods of treatment have been discussed with the patient and family. After consideration of risks, benefits and other options for treatment, the patient has consented to  Procedure(s): [place] (N/A) as a surgical intervention .  The patient's history has been reviewed, patient examined, no change in status, stable for surgery.  I have reviewed the patient's chart and labs.  Questions were answered to the patient's satisfaction.     Ashden Sonnenberg C.

## 2017-10-06 NOTE — Discharge Instructions (Addendum)
GENERAL SURGERY: POST OP INSTRUCTIONS  1. DIET: Follow a light bland diet the first 24 hours after arrival home, such as soup, liquids, crackers, etc.  Be sure to include lots of fluids daily.  Avoid fast food or heavy meals as your are more likely to get nauseated.   2. Take your usually prescribed home medications unless otherwise directed. 3. PAIN CONTROL: a. Pain is best controlled by a usual combination of three different methods TOGETHER: i. Ice/Heat ii. Over the counter pain medication iii. Prescription pain medication b. Most patients will experience some swelling and bruising around the incisions.  Ice packs or heating pads (30-60 minutes up to 6 times a day) will help. Use ice for the first few days to help decrease swelling and bruising, then switch to heat to help relax tight/sore spots and speed recovery.  Some people prefer to use ice alone, heat alone, alternating between ice & heat.  Experiment to what works for you.  Swelling and bruising can take several weeks to resolve.   c. It is helpful to take an over-the-counter pain medication regularly for the first few weeks.  Choose one of the following that works best for you: i. Naproxen (Aleve, etc)  Two 220mg  tabs twice a day ii. Ibuprofen (Advil, etc) Three 200mg  tabs four times a day (every meal & bedtime) d. A  prescription for pain medication (such as Percocet, oxycodone, hydrocodone, etc) should be given to you upon discharge.  Take your pain medication as prescribed.  i. If you are having problems/concerns with the prescription medicine (does not control pain, nausea, vomiting, rash, itching, etc), please call us [phone] to see if we need to switch you to a different pain medicine that will work better for you and/or control your side effect better. ii. If you need a refill on your pain medication, please contact your pharmacy.  They will contact our office to request authorization. Prescriptions will not be filled after 5  pm or on week-ends. 4. Avoid getting constipated.  Between the surgery and the pain medications, it is common to experience some constipation.  Increasing fluid intake and taking a fiber supplement (such as Metamucil, Citrucel, FiberCon, MiraLax, etc) 1-2 times a day regularly will usually help prevent this problem from occurring.  A mild laxative (prune juice, Milk of Magnesia, MiraLax, etc) should be taken according to package directions if there are no bowel movements after 48 hours.   5. Wash / shower every day.  You may shower over the dressings as they are waterproof.  Continue to shower over incision(s) after the dressing is off. Remove your waterproof bandages 5 days after surgery or when it start to come off.  Replace with a new dressing and change as needed.  Do not removed the non-adherent (sticky dressing) from the wound.  6. ACTIVITIES as tolerated:   a. You may resume regular (light) daily activities beginning the next day--such as daily self-care, walking, climbing stairs--gradually increasing activities as tolerated.  If you can walk 30 minutes without difficulty, it is safe to try more intense activity such as jogging, treadmill, bicycling, low-impact aerobics, swimming, etc. b. Save the most intensive and strenuous activity for last such as sit-ups, heavy lifting, contact sports, etc  Refrain from any heavy lifting or straining until you are off narcotics for pain control.   c. DO NOT PUSH THROUGH PAIN.  Let pain be your guide: If it hurts to do something, don't do it.  Pain is your  body warning you to avoid that activity for another week until the pain goes down. d. You may drive when you are no longer taking prescription pain medication, you can comfortably wear a seatbelt, and you can safely maneuver your car and apply brakes. e. Dennis Bast may have sexual intercourse when it is comfortable.  7. FOLLOW UP in our office a. Please call [facility] at [phone] to set up an appointment to see  your surgeon in the office for a follow-up appointment approximately 2-3 weeks after your surgery. b. Make sure that you call for this appointment the day you arrive home to insure a convenient appointment time. 9. IF YOU HAVE DISABILITY OR FAMILY LEAVE FORMS, BRING THEM TO THE OFFICE FOR PROCESSING.  DO NOT GIVE THEM TO YOUR DOCTOR.   WHEN TO CALL us [phone]: 1. Poor pain control 2. Reactions / problems with new medications (rash/itching, nausea, etc)  3. Fever over 101.5 F (38.5 C) 4. Worsening swelling or bruising 5. Continued bleeding from incision. 6. Increased pain, redness, or drainage from the incision   The clinic staff is available to answer your questions during regular business hours (8:30am-5pm).  Please dont hesitate to call and ask to speak to one of our nurses for clinical concerns.   If you have a medical emergency, go to the nearest emergency room or call 911.  A surgeon from [facility] is always on call at the [facility], [place], [place], [place], [place]  [address] ? MAIN: [phone] ? TOLL FREE: [phone] ?  FAX [phone] [email or web address]   Post Anesthesia Home Care Instructions  Activity: Get plenty of rest for the remainder of the day. A responsible individual must stay with you for 24 hours following the procedure.  For the next 24 hours, DO NOT: -Drive a car -Paediatric nurse -Drink alcoholic beverages -Take any medication unless instructed by your physician -Make any legal decisions or sign important papers.  Meals: Start with liquid foods such as gelatin or soup. Progress to regular foods as tolerated. Avoid greasy, spicy, heavy foods. If nausea and/or vomiting occur, drink only clear liquids until the nausea and/or vomiting subsides. Call your physician if vomiting continues.  Special Instructions/Symptoms: Your throat may feel dry or sore from the  anesthesia or the breathing tube placed in your throat during surgery. If this causes discomfort, gargle with warm salt water. The discomfort should disappear within 24 hours.  If you had a scopolamine patch placed behind your ear for the management of post- operative nausea and/or vomiting:  1. The medication in the patch is effective for 72 hours, after which it should be removed.  Wrap patch in a tissue and discard in the trash. Wash hands thoroughly with soap and water. 2. You may remove the patch earlier than 72 hours if you experience unpleasant side effects which may include dry mouth, dizziness or visual disturbances. 3. Avoid touching the patch. Wash your hands with soap and water after contact with the patch.

## 2017-10-06 NOTE — Op Note (Signed)
10/06/2017  11:36 AM  PATIENT:  Maurice Munoz  72 y.o. male  Patient Care Team: Redmond School, MD as PCP - General (Internal Medicine) Danie Binder, MD as Consulting Physician (Gastroenterology)  PRE-OPERATIVE DIAGNOSIS:  non-healing pilonidal wound  POST-OPERATIVE DIAGNOSIS:  non-healing pilonidal wound  PROCEDURE:  DEBRIDEMENT WOUND, PLACEMENT OF WOUND HEALING MATRIX (GRAFFIX)   Surgeon(s): Leighton Ruff, MD  ASSISTANT: none   ANESTHESIA:   local and MAC  SPECIMEN:  No Specimen  DISPOSITION OF SPECIMEN:  N/A  COUNTS:  YES  PLAN OF CARE: Discharge to home after PACU  PATIENT DISPOSITION:  PACU - hemodynamically stable.  INDICATION: 72 year old male status post advancement flap for nonhealing chronic pilonidal wound.  This is failed and left him with a 2 cm wide and 3 cm deep defect.  We have tried packing this for the past 6 months with no change in wound size.  I recommended operative debridement and placement of wound healing matrix to assist in wound closure.   OR FINDINGS: 2 cm deep by 1 cm wide nonhealing chronic wound.  DESCRIPTION: the patient was identified in the preoperative holding area and taken to the OR where they were laid on the operating room table.  MAC anesthesia was induced without difficulty. The patient was then positioned in prone jackknife position with buttocks gently taped apart.  The patient was then prepped and draped in usual sterile fashion.  SCDs were noted to be in place prior to the initiation of anesthesia. A surgical timeout was performed indicating the correct patient, procedure, positioning and need for preoperative antibiotics.  A field block was performed using Marcaine with epinephrine.    Frequency of debridement: once Area of body debrided: pilonidal Presence and extent of infected tissue: none Presence and extent of non viable tissue: scar coating wound cavity is nonviable  The total area of devitalized tissue was 1cm x  1cm 2 cm.  A 15 blade scalpel was used to debride the wound.  Debridement was carried down to the level of normal appearing, bleeding subcutaneous tissue.  Skin/scar tissue was removed.  There was non-perfused scar material in the wound that would inhibit healing.  At the end of the procedure the debrided area measured 1.5cm x 1.5cm x 2cm.  The wound was then packed with Graffix wound healing matrix and sutured into place with 3-0 Vicryl sutures.  The skin was then closed over this using 2-0 nylon interrupted sutures.  An anti-adherent dressing was then applied and covered with a Tegaderm.  The patient was awakened from anesthesia and sent to the PACU in stable condition.  All counts were correct per OR staff.

## 2017-10-06 NOTE — Anesthesia Preprocedure Evaluation (Signed)
Anesthesia Evaluation  Patient identified by MRN, date of birth, ID band Patient awake    Reviewed: Allergy & Precautions, NPO status , Patient's Chart, lab work & pertinent test results  Airway Mallampati: II  TM Distance: >3 FB Neck ROM: Full    Dental no notable dental hx.    Pulmonary neg pulmonary ROS, former smoker,    Pulmonary exam normal breath sounds clear to auscultation       Cardiovascular hypertension, + CAD, + Cardiac Stents and + Peripheral Vascular Disease  Normal cardiovascular exam Rhythm:Regular Rate:Normal     Neuro/Psych negative neurological ROS  negative psych ROS   GI/Hepatic negative GI ROS, Neg liver ROS,   Endo/Other  negative endocrine ROS  Renal/GU Renal InsufficiencyRenal disease  negative genitourinary   Musculoskeletal negative musculoskeletal ROS (+)   Abdominal   Peds negative pediatric ROS (+)  Hematology negative hematology ROS (+)   Anesthesia Other Findings   Reproductive/Obstetrics negative OB ROS                             Anesthesia Physical Anesthesia Plan  ASA: III  Anesthesia Plan: MAC   Post-op Pain Management:    Induction: Intravenous  PONV Risk Score and Plan: 1 and Ondansetron, Treatment may vary due to age or medical condition and Propofol infusion  Airway Management Planned: Simple Face Mask  Additional Equipment:   Intra-op Plan:   Post-operative Plan:   Informed Consent: I have reviewed the patients History and Physical, chart, labs and discussed the procedure including the risks, benefits and alternatives for the proposed anesthesia with the patient or authorized representative who has indicated his/her understanding and acceptance.   Dental advisory given  Plan Discussed with: CRNA and Surgeon  Anesthesia Plan Comments:         Anesthesia Quick Evaluation

## 2017-10-10 ENCOUNTER — Encounter (HOSPITAL_BASED_OUTPATIENT_CLINIC_OR_DEPARTMENT_OTHER): Payer: Self-pay | Admitting: General Surgery

## 2017-10-11 ENCOUNTER — Encounter (HOSPITAL_BASED_OUTPATIENT_CLINIC_OR_DEPARTMENT_OTHER): Payer: Self-pay | Admitting: General Surgery

## 2017-10-20 DIAGNOSIS — E669 Obesity, unspecified: Secondary | ICD-10-CM | POA: Diagnosis not present

## 2017-10-20 DIAGNOSIS — G894 Chronic pain syndrome: Secondary | ICD-10-CM | POA: Diagnosis not present

## 2017-10-20 DIAGNOSIS — J8410 Pulmonary fibrosis, unspecified: Secondary | ICD-10-CM | POA: Diagnosis not present

## 2017-10-20 DIAGNOSIS — Z1389 Encounter for screening for other disorder: Secondary | ICD-10-CM | POA: Diagnosis not present

## 2017-10-20 DIAGNOSIS — L0501 Pilonidal cyst with abscess: Secondary | ICD-10-CM | POA: Diagnosis not present

## 2017-10-20 DIAGNOSIS — E6609 Other obesity due to excess calories: Secondary | ICD-10-CM | POA: Diagnosis not present

## 2017-10-20 DIAGNOSIS — I10 Essential (primary) hypertension: Secondary | ICD-10-CM | POA: Diagnosis not present

## 2017-10-20 DIAGNOSIS — I700 Atherosclerosis of aorta: Secondary | ICD-10-CM | POA: Diagnosis not present

## 2017-10-20 DIAGNOSIS — Z23 Encounter for immunization: Secondary | ICD-10-CM | POA: Diagnosis not present

## 2017-11-15 ENCOUNTER — Telehealth: Payer: Self-pay | Admitting: Cardiovascular Disease

## 2017-11-15 NOTE — Telephone Encounter (Signed)
New Message:     [facility] Pre-operative Risk Assessment    Request for surgical clearance:  1. What type of surgery is being performed? Epidural steroid injection   2. When is this surgery scheduled? 11/23/2017   3. What type of clearance is required (medical clearance vs. Pharmacy clearance to hold med vs. Both)? Pharmacy  4. Are there any medications that need to be held prior to surgery and how long?Plavix 7 days prior to procedure  5. Practice name and name of physician performing surgery? Dr. Elta Guadeloupe Roy/ [facility]   6. What is your office phone number [phone]   7.   What is your office fax number [phone]  8.   Anesthesia type (None, local, MAC, general) ? None   Maurice Munoz 11/15/2017, 3:38 PM  _________________________________________________________________   (provider comments below)

## 2017-11-16 NOTE — Telephone Encounter (Addendum)
   Primary Cardiologist: Shelva Majestic, MD  Chart reviewed as part of pre-operative protocol coverage. Patient was contacted 11/16/2017 in reference to pre-operative risk assessment for pending surgery as outlined below.  Maurice Munoz was last seen 08/2017 by Dr. Claiborne Billings at which time he was cleared to proceed with redo debridement surgery  - Dr. Claiborne Billings had also suspected he would have to hold Plavix prior to that surgery. The patient has h/o CAD s/p remote PCI (last in 2007), HTN, possible ILD, DDD, CKD III. He had a low risk nuc 07/2017.   In our conversation today, since that day, Maurice Munoz has done well from a cardiac standpoint. Activity is limited by back pain but otherwise no CP/SOB with usual ADLs. Therefore, based on ACC/AHA guidelines, the patient would be at acceptable risk for the planned procedure without further cardiovascular testing. Reviewed with Dr. Johnsie Cancel in Dr. Evette Georges absence (out of the office), he is OK to hold Plavix for 7 days prior to procedure. Patient states he is already doing so as of this AM.  I will route this recommendation to the requesting party via Epic fax function and remove from pre-op pool.  Please call with questions.  Charlie Pitter, PA-C 11/16/2017, 3:04 PM

## 2017-11-23 DIAGNOSIS — M5116 Intervertebral disc disorders with radiculopathy, lumbar region: Secondary | ICD-10-CM | POA: Diagnosis not present

## 2017-12-07 DIAGNOSIS — M4716 Other spondylosis with myelopathy, lumbar region: Secondary | ICD-10-CM | POA: Diagnosis not present

## 2017-12-15 ENCOUNTER — Encounter (HOSPITAL_BASED_OUTPATIENT_CLINIC_OR_DEPARTMENT_OTHER): Payer: Medicare Other | Attending: Internal Medicine

## 2017-12-15 DIAGNOSIS — E669 Obesity, unspecified: Secondary | ICD-10-CM | POA: Insufficient documentation

## 2017-12-15 DIAGNOSIS — I2510 Atherosclerotic heart disease of native coronary artery without angina pectoris: Secondary | ICD-10-CM | POA: Diagnosis not present

## 2017-12-15 DIAGNOSIS — Y838 Other surgical procedures as the cause of abnormal reaction of the patient, or of later complication, without mention of misadventure at the time of the procedure: Secondary | ICD-10-CM | POA: Diagnosis not present

## 2017-12-15 DIAGNOSIS — Z87891 Personal history of nicotine dependence: Secondary | ICD-10-CM | POA: Diagnosis not present

## 2017-12-15 DIAGNOSIS — Z6830 Body mass index (BMI) 30.0-30.9, adult: Secondary | ICD-10-CM | POA: Diagnosis not present

## 2017-12-15 DIAGNOSIS — T8131XA Disruption of external operation (surgical) wound, not elsewhere classified, initial encounter: Secondary | ICD-10-CM | POA: Diagnosis not present

## 2017-12-15 DIAGNOSIS — I10 Essential (primary) hypertension: Secondary | ICD-10-CM | POA: Diagnosis not present

## 2017-12-15 DIAGNOSIS — L89153 Pressure ulcer of sacral region, stage 3: Secondary | ICD-10-CM | POA: Diagnosis not present

## 2017-12-29 ENCOUNTER — Encounter (HOSPITAL_BASED_OUTPATIENT_CLINIC_OR_DEPARTMENT_OTHER): Payer: Medicare Other | Attending: Internal Medicine

## 2017-12-29 DIAGNOSIS — Y838 Other surgical procedures as the cause of abnormal reaction of the patient, or of later complication, without mention of misadventure at the time of the procedure: Secondary | ICD-10-CM | POA: Diagnosis not present

## 2017-12-29 DIAGNOSIS — T8131XA Disruption of external operation (surgical) wound, not elsewhere classified, initial encounter: Secondary | ICD-10-CM | POA: Diagnosis not present

## 2017-12-29 DIAGNOSIS — I10 Essential (primary) hypertension: Secondary | ICD-10-CM | POA: Insufficient documentation

## 2017-12-29 DIAGNOSIS — L89153 Pressure ulcer of sacral region, stage 3: Secondary | ICD-10-CM | POA: Diagnosis not present

## 2017-12-29 DIAGNOSIS — I2510 Atherosclerotic heart disease of native coronary artery without angina pectoris: Secondary | ICD-10-CM | POA: Insufficient documentation

## 2018-01-05 DIAGNOSIS — T8131XA Disruption of external operation (surgical) wound, not elsewhere classified, initial encounter: Secondary | ICD-10-CM | POA: Diagnosis not present

## 2018-01-05 DIAGNOSIS — I10 Essential (primary) hypertension: Secondary | ICD-10-CM | POA: Diagnosis not present

## 2018-01-05 DIAGNOSIS — L89153 Pressure ulcer of sacral region, stage 3: Secondary | ICD-10-CM | POA: Diagnosis not present

## 2018-01-05 DIAGNOSIS — I2510 Atherosclerotic heart disease of native coronary artery without angina pectoris: Secondary | ICD-10-CM | POA: Diagnosis not present

## 2018-01-12 DIAGNOSIS — T8131XA Disruption of external operation (surgical) wound, not elsewhere classified, initial encounter: Secondary | ICD-10-CM | POA: Diagnosis not present

## 2018-01-12 DIAGNOSIS — I10 Essential (primary) hypertension: Secondary | ICD-10-CM | POA: Diagnosis not present

## 2018-01-12 DIAGNOSIS — I2510 Atherosclerotic heart disease of native coronary artery without angina pectoris: Secondary | ICD-10-CM | POA: Diagnosis not present

## 2018-01-12 DIAGNOSIS — L89153 Pressure ulcer of sacral region, stage 3: Secondary | ICD-10-CM | POA: Diagnosis not present

## 2018-01-18 DIAGNOSIS — Z6833 Body mass index (BMI) 33.0-33.9, adult: Secondary | ICD-10-CM | POA: Diagnosis not present

## 2018-01-18 DIAGNOSIS — Z0001 Encounter for general adult medical examination with abnormal findings: Secondary | ICD-10-CM | POA: Diagnosis not present

## 2018-01-18 DIAGNOSIS — F329 Major depressive disorder, single episode, unspecified: Secondary | ICD-10-CM | POA: Diagnosis not present

## 2018-01-18 DIAGNOSIS — Z1389 Encounter for screening for other disorder: Secondary | ICD-10-CM | POA: Diagnosis not present

## 2018-01-18 DIAGNOSIS — I700 Atherosclerosis of aorta: Secondary | ICD-10-CM | POA: Diagnosis not present

## 2018-01-18 DIAGNOSIS — I270 Primary pulmonary hypertension: Secondary | ICD-10-CM | POA: Diagnosis not present

## 2018-01-18 DIAGNOSIS — I10 Essential (primary) hypertension: Secondary | ICD-10-CM | POA: Diagnosis not present

## 2018-01-18 DIAGNOSIS — J8410 Pulmonary fibrosis, unspecified: Secondary | ICD-10-CM | POA: Diagnosis not present

## 2018-01-18 DIAGNOSIS — E6609 Other obesity due to excess calories: Secondary | ICD-10-CM | POA: Diagnosis not present

## 2018-01-19 ENCOUNTER — Encounter (HOSPITAL_BASED_OUTPATIENT_CLINIC_OR_DEPARTMENT_OTHER): Payer: Medicare Other | Attending: Internal Medicine

## 2018-01-19 DIAGNOSIS — L89153 Pressure ulcer of sacral region, stage 3: Secondary | ICD-10-CM | POA: Diagnosis not present

## 2018-01-19 DIAGNOSIS — Z87891 Personal history of nicotine dependence: Secondary | ICD-10-CM | POA: Diagnosis not present

## 2018-01-19 DIAGNOSIS — T8131XA Disruption of external operation (surgical) wound, not elsewhere classified, initial encounter: Secondary | ICD-10-CM | POA: Diagnosis not present

## 2018-01-19 DIAGNOSIS — I10 Essential (primary) hypertension: Secondary | ICD-10-CM | POA: Insufficient documentation

## 2018-01-19 DIAGNOSIS — I2510 Atherosclerotic heart disease of native coronary artery without angina pectoris: Secondary | ICD-10-CM | POA: Insufficient documentation

## 2018-01-19 DIAGNOSIS — Y838 Other surgical procedures as the cause of abnormal reaction of the patient, or of later complication, without mention of misadventure at the time of the procedure: Secondary | ICD-10-CM | POA: Insufficient documentation

## 2018-01-26 DIAGNOSIS — T8131XA Disruption of external operation (surgical) wound, not elsewhere classified, initial encounter: Secondary | ICD-10-CM | POA: Diagnosis not present

## 2018-02-02 DIAGNOSIS — I2510 Atherosclerotic heart disease of native coronary artery without angina pectoris: Secondary | ICD-10-CM | POA: Diagnosis not present

## 2018-02-02 DIAGNOSIS — I10 Essential (primary) hypertension: Secondary | ICD-10-CM | POA: Diagnosis not present

## 2018-02-02 DIAGNOSIS — T8131XA Disruption of external operation (surgical) wound, not elsewhere classified, initial encounter: Secondary | ICD-10-CM | POA: Diagnosis not present

## 2018-02-02 DIAGNOSIS — L98422 Non-pressure chronic ulcer of back with fat layer exposed: Secondary | ICD-10-CM | POA: Diagnosis not present

## 2018-02-02 DIAGNOSIS — Z87891 Personal history of nicotine dependence: Secondary | ICD-10-CM | POA: Diagnosis not present

## 2018-02-09 DIAGNOSIS — I10 Essential (primary) hypertension: Secondary | ICD-10-CM | POA: Diagnosis not present

## 2018-02-09 DIAGNOSIS — Z87891 Personal history of nicotine dependence: Secondary | ICD-10-CM | POA: Diagnosis not present

## 2018-02-09 DIAGNOSIS — I2510 Atherosclerotic heart disease of native coronary artery without angina pectoris: Secondary | ICD-10-CM | POA: Diagnosis not present

## 2018-02-09 DIAGNOSIS — L98422 Non-pressure chronic ulcer of back with fat layer exposed: Secondary | ICD-10-CM | POA: Diagnosis not present

## 2018-02-09 DIAGNOSIS — T8131XA Disruption of external operation (surgical) wound, not elsewhere classified, initial encounter: Secondary | ICD-10-CM | POA: Diagnosis not present

## 2018-02-16 DIAGNOSIS — T8131XA Disruption of external operation (surgical) wound, not elsewhere classified, initial encounter: Secondary | ICD-10-CM | POA: Diagnosis not present

## 2018-02-16 DIAGNOSIS — T8189XA Other complications of procedures, not elsewhere classified, initial encounter: Secondary | ICD-10-CM | POA: Diagnosis not present

## 2018-02-16 DIAGNOSIS — L98422 Non-pressure chronic ulcer of back with fat layer exposed: Secondary | ICD-10-CM | POA: Diagnosis not present

## 2018-02-16 DIAGNOSIS — I2510 Atherosclerotic heart disease of native coronary artery without angina pectoris: Secondary | ICD-10-CM | POA: Diagnosis not present

## 2018-02-16 DIAGNOSIS — I10 Essential (primary) hypertension: Secondary | ICD-10-CM | POA: Diagnosis not present

## 2018-02-16 DIAGNOSIS — Z87891 Personal history of nicotine dependence: Secondary | ICD-10-CM | POA: Diagnosis not present

## 2018-02-23 ENCOUNTER — Encounter (HOSPITAL_BASED_OUTPATIENT_CLINIC_OR_DEPARTMENT_OTHER): Payer: Medicare Other | Attending: Internal Medicine

## 2018-02-23 DIAGNOSIS — L98429 Non-pressure chronic ulcer of back with unspecified severity: Secondary | ICD-10-CM | POA: Diagnosis not present

## 2018-02-23 DIAGNOSIS — I2510 Atherosclerotic heart disease of native coronary artery without angina pectoris: Secondary | ICD-10-CM | POA: Insufficient documentation

## 2018-02-23 DIAGNOSIS — Z872 Personal history of diseases of the skin and subcutaneous tissue: Secondary | ICD-10-CM | POA: Diagnosis not present

## 2018-02-23 DIAGNOSIS — I10 Essential (primary) hypertension: Secondary | ICD-10-CM | POA: Diagnosis not present

## 2018-02-23 DIAGNOSIS — Z09 Encounter for follow-up examination after completed treatment for conditions other than malignant neoplasm: Secondary | ICD-10-CM | POA: Diagnosis not present

## 2018-02-27 DIAGNOSIS — R809 Proteinuria, unspecified: Secondary | ICD-10-CM | POA: Diagnosis not present

## 2018-02-27 DIAGNOSIS — E559 Vitamin D deficiency, unspecified: Secondary | ICD-10-CM | POA: Diagnosis not present

## 2018-02-27 DIAGNOSIS — D509 Iron deficiency anemia, unspecified: Secondary | ICD-10-CM | POA: Diagnosis not present

## 2018-02-27 DIAGNOSIS — I10 Essential (primary) hypertension: Secondary | ICD-10-CM | POA: Diagnosis not present

## 2018-02-27 DIAGNOSIS — Z79899 Other long term (current) drug therapy: Secondary | ICD-10-CM | POA: Diagnosis not present

## 2018-02-27 DIAGNOSIS — N183 Chronic kidney disease, stage 3 (moderate): Secondary | ICD-10-CM | POA: Diagnosis not present

## 2018-03-01 DIAGNOSIS — E559 Vitamin D deficiency, unspecified: Secondary | ICD-10-CM | POA: Diagnosis not present

## 2018-03-01 DIAGNOSIS — N183 Chronic kidney disease, stage 3 (moderate): Secondary | ICD-10-CM | POA: Diagnosis not present

## 2018-03-01 DIAGNOSIS — E669 Obesity, unspecified: Secondary | ICD-10-CM | POA: Diagnosis not present

## 2018-03-01 DIAGNOSIS — R809 Proteinuria, unspecified: Secondary | ICD-10-CM | POA: Diagnosis not present

## 2018-03-15 ENCOUNTER — Other Ambulatory Visit (HOSPITAL_COMMUNITY): Payer: Self-pay | Admitting: Cardiovascular Disease

## 2018-04-11 ENCOUNTER — Emergency Department (HOSPITAL_COMMUNITY): Payer: Medicare Other

## 2018-04-11 ENCOUNTER — Observation Stay (HOSPITAL_COMMUNITY): Payer: Medicare Other

## 2018-04-11 ENCOUNTER — Inpatient Hospital Stay (HOSPITAL_COMMUNITY)
Admission: EM | Admit: 2018-04-11 | Discharge: 2018-04-15 | DRG: 246 | Disposition: A | Discharge status: Home / Self Care | Payer: Medicare Other | Attending: Cardiovascular Disease | Admitting: Cardiovascular Disease

## 2018-04-11 ENCOUNTER — Other Ambulatory Visit: Payer: Self-pay

## 2018-04-11 ENCOUNTER — Encounter (HOSPITAL_COMMUNITY): Payer: Self-pay | Admitting: *Deleted

## 2018-04-11 DIAGNOSIS — Z7902 Long term (current) use of antithrombotics/antiplatelets: Secondary | ICD-10-CM

## 2018-04-11 DIAGNOSIS — Z87891 Personal history of nicotine dependence: Secondary | ICD-10-CM

## 2018-04-11 DIAGNOSIS — Z8249 Family history of ischemic heart disease and other diseases of the circulatory system: Secondary | ICD-10-CM

## 2018-04-11 DIAGNOSIS — R609 Edema, unspecified: Secondary | ICD-10-CM | POA: Diagnosis not present

## 2018-04-11 DIAGNOSIS — J849 Interstitial pulmonary disease, unspecified: Secondary | ICD-10-CM | POA: Diagnosis present

## 2018-04-11 DIAGNOSIS — I129 Hypertensive chronic kidney disease with stage 1 through stage 4 chronic kidney disease, or unspecified chronic kidney disease: Secondary | ICD-10-CM | POA: Diagnosis present

## 2018-04-11 DIAGNOSIS — R079 Chest pain, unspecified: Secondary | ICD-10-CM

## 2018-04-11 DIAGNOSIS — Z79899 Other long term (current) drug therapy: Secondary | ICD-10-CM

## 2018-04-11 DIAGNOSIS — Z7982 Long term (current) use of aspirin: Secondary | ICD-10-CM

## 2018-04-11 DIAGNOSIS — Z87442 Personal history of urinary calculi: Secondary | ICD-10-CM

## 2018-04-11 DIAGNOSIS — I2510 Atherosclerotic heart disease of native coronary artery without angina pectoris: Secondary | ICD-10-CM | POA: Diagnosis present

## 2018-04-11 DIAGNOSIS — I214 Non-ST elevation (NSTEMI) myocardial infarction: Principal | ICD-10-CM | POA: Diagnosis present

## 2018-04-11 DIAGNOSIS — K5909 Other constipation: Secondary | ICD-10-CM | POA: Diagnosis present

## 2018-04-11 DIAGNOSIS — R918 Other nonspecific abnormal finding of lung field: Secondary | ICD-10-CM

## 2018-04-11 DIAGNOSIS — I200 Unstable angina: Secondary | ICD-10-CM

## 2018-04-11 DIAGNOSIS — E785 Hyperlipidemia, unspecified: Secondary | ICD-10-CM | POA: Diagnosis present

## 2018-04-11 DIAGNOSIS — G894 Chronic pain syndrome: Secondary | ICD-10-CM | POA: Diagnosis not present

## 2018-04-11 DIAGNOSIS — I10 Essential (primary) hypertension: Secondary | ICD-10-CM | POA: Insufficient documentation

## 2018-04-11 DIAGNOSIS — I25110 Atherosclerotic heart disease of native coronary artery with unstable angina pectoris: Secondary | ICD-10-CM | POA: Diagnosis not present

## 2018-04-11 DIAGNOSIS — M25512 Pain in left shoulder: Secondary | ICD-10-CM | POA: Diagnosis not present

## 2018-04-11 DIAGNOSIS — J8410 Pulmonary fibrosis, unspecified: Secondary | ICD-10-CM

## 2018-04-11 DIAGNOSIS — M109 Gout, unspecified: Secondary | ICD-10-CM | POA: Diagnosis present

## 2018-04-11 DIAGNOSIS — J84112 Idiopathic pulmonary fibrosis: Secondary | ICD-10-CM

## 2018-04-11 DIAGNOSIS — Z888 Allergy status to other drugs, medicaments and biological substances status: Secondary | ICD-10-CM

## 2018-04-11 DIAGNOSIS — Z6831 Body mass index (BMI) 31.0-31.9, adult: Secondary | ICD-10-CM

## 2018-04-11 DIAGNOSIS — R296 Repeated falls: Secondary | ICD-10-CM | POA: Diagnosis present

## 2018-04-11 DIAGNOSIS — E669 Obesity, unspecified: Secondary | ICD-10-CM | POA: Diagnosis present

## 2018-04-11 DIAGNOSIS — Z955 Presence of coronary angioplasty implant and graft: Secondary | ICD-10-CM

## 2018-04-11 DIAGNOSIS — N183 Chronic kidney disease, stage 3 (moderate): Secondary | ICD-10-CM | POA: Diagnosis present

## 2018-04-11 DIAGNOSIS — M19012 Primary osteoarthritis, left shoulder: Secondary | ICD-10-CM | POA: Diagnosis not present

## 2018-04-11 DIAGNOSIS — Z880 Allergy status to penicillin: Secondary | ICD-10-CM

## 2018-04-11 LAB — I-STAT TROPONIN, ED: TROPONIN I, POC: 0 ng/mL (ref 0.00–0.08)

## 2018-04-11 LAB — CBC
HCT: 45.1 % (ref 39.0–52.0)
HEMOGLOBIN: 14.4 g/dL (ref 13.0–17.0)
MCH: 28.9 pg (ref 26.0–34.0)
MCHC: 31.9 g/dL (ref 30.0–36.0)
MCV: 90.4 fL (ref 80.0–100.0)
NRBC: 0 % (ref 0.0–0.2)
PLATELETS: 222 10*3/uL (ref 150–400)
RBC: 4.99 MIL/uL (ref 4.22–5.81)
RDW: 14.4 % (ref 11.5–15.5)
WBC: 8.1 10*3/uL (ref 4.0–10.5)

## 2018-04-11 LAB — BASIC METABOLIC PANEL
ANION GAP: 9 (ref 5–15)
BUN: 22 mg/dL (ref 8–23)
CO2: 25 mmol/L (ref 22–32)
Calcium: 9.2 mg/dL (ref 8.9–10.3)
Chloride: 103 mmol/L (ref 98–111)
Creatinine, Ser: 1.11 mg/dL (ref 0.61–1.24)
Glucose, Bld: 94 mg/dL (ref 70–99)
POTASSIUM: 4 mmol/L (ref 3.5–5.1)
Sodium: 137 mmol/L (ref 135–145)

## 2018-04-11 MED ORDER — SODIUM CHLORIDE 0.9 % IV SOLN
INTRAVENOUS | Status: DC
  Administered 2018-04-12 – 2018-04-13 (×2): via INTRAVENOUS

## 2018-04-11 MED ORDER — SIMVASTATIN 20 MG PO TABS
20.0000 mg | ORAL_TABLET | Freq: Every day | ORAL | Status: DC
  Administered 2018-04-12: 20 mg via ORAL
  Filled 2018-04-11: qty 1

## 2018-04-11 MED ORDER — ACETAMINOPHEN 325 MG PO TABS: 650.0000 mg | ORAL_TABLET | Freq: Four times a day (QID) | ORAL | Status: DC | PRN

## 2018-04-11 MED ORDER — ACETAMINOPHEN 650 MG RE SUPP: 650.0000 mg | Freq: Four times a day (QID) | RECTAL | Status: DC | PRN

## 2018-04-11 MED ORDER — TIZANIDINE HCL 4 MG PO TABS
4.0000 mg | ORAL_TABLET | Freq: Every day | ORAL | Status: DC
  Administered 2018-04-12 – 2018-04-14 (×4): 4 mg via ORAL
  Filled 2018-04-11 (×7): qty 1

## 2018-04-11 MED ORDER — OXYCODONE-ACETAMINOPHEN 10-325 MG PO TABS: 1.0000 | ORAL_TABLET | Freq: Three times a day (TID) | ORAL | Status: DC | PRN

## 2018-04-11 MED ORDER — OXYCODONE-ACETAMINOPHEN 5-325 MG PO TABS
1.0000 | ORAL_TABLET | Freq: Three times a day (TID) | ORAL | Status: DC | PRN
  Administered 2018-04-12 (×2): 1 via ORAL
  Filled 2018-04-11 (×2): qty 1

## 2018-04-11 MED ORDER — IOHEXOL 300 MG/ML  SOLN
75.0000 mL | Freq: Once | INTRAMUSCULAR | Status: AC | PRN
  Administered 2018-04-11: 75 mL via INTRAVENOUS

## 2018-04-11 MED ORDER — ASPIRIN 81 MG PO CHEW
324.0000 mg | CHEWABLE_TABLET | Freq: Once | ORAL | Status: AC
Start: 2018-04-11 — End: 2018-04-11
  Administered 2018-04-11: 324 mg via ORAL
  Filled 2018-04-11: qty 4

## 2018-04-11 MED ORDER — METOPROLOL TARTRATE 25 MG PO TABS
50.0000 mg | ORAL_TABLET | Freq: Two times a day (BID) | ORAL | Status: DC
  Administered 2018-04-12 – 2018-04-15 (×7): 50 mg via ORAL
  Filled 2018-04-11: qty 1
  Filled 2018-04-11 (×2): qty 2
  Filled 2018-04-11 (×2): qty 1
  Filled 2018-04-11: qty 2
  Filled 2018-04-11: qty 1

## 2018-04-11 MED ORDER — LISINOPRIL 5 MG PO TABS: 5.0000 mg | ORAL_TABLET | Freq: Every evening | ORAL | Status: DC

## 2018-04-11 MED ORDER — OXYCODONE HCL 5 MG PO TABS: 5.0000 mg | ORAL_TABLET | Freq: Three times a day (TID) | ORAL | Status: DC | PRN

## 2018-04-11 MED ORDER — ASPIRIN EC 81 MG PO TBEC
81.0000 mg | DELAYED_RELEASE_TABLET | Freq: Every day | ORAL | Status: DC
  Administered 2018-04-12 – 2018-04-13 (×2): 81 mg via ORAL
  Filled 2018-04-11 (×2): qty 1

## 2018-04-11 MED ORDER — AMLODIPINE BESYLATE 5 MG PO TABS
5.0000 mg | ORAL_TABLET | Freq: Every day | ORAL | Status: DC
  Administered 2018-04-12 – 2018-04-15 (×4): 5 mg via ORAL
  Filled 2018-04-11 (×4): qty 1

## 2018-04-11 MED ORDER — ENOXAPARIN SODIUM 40 MG/0.4ML ~~LOC~~ SOLN
40.0000 mg | SUBCUTANEOUS | Status: DC
  Administered 2018-04-12: 40 mg via SUBCUTANEOUS
  Filled 2018-04-11: qty 0.4

## 2018-04-11 MED ORDER — NITROGLYCERIN 0.4 MG SL SUBL: 0.4000 mg | SUBLINGUAL_TABLET | SUBLINGUAL | Status: DC | PRN

## 2018-04-11 MED ORDER — VITAMIN D 1000 UNITS PO TABS
1000.0000 [IU] | ORAL_TABLET | Freq: Every morning | ORAL | Status: DC
  Administered 2018-04-12 – 2018-04-15 (×5): 1000 [IU] via ORAL
  Filled 2018-04-11 (×7): qty 1

## 2018-04-11 MED ORDER — CLOPIDOGREL BISULFATE 75 MG PO TABS
75.0000 mg | ORAL_TABLET | Freq: Every day | ORAL | Status: DC
  Administered 2018-04-12 – 2018-04-13 (×2): 75 mg via ORAL
  Filled 2018-04-11 (×2): qty 1

## 2018-04-11 NOTE — H&P (Signed)
TRH H&P    Patient Demographics:    Maurice Munoz, is a 72 y.o. male  MRN: [id number]  DOB - [birth year]  Admit Date - 04/11/2018  Referring MD/NP/PA: Dr. Lita Mains  Outpatient Primary MD for the patient is [employer], MD  Patient coming from: Home  Chief complaint-chest pain   HPI:    Maurice Munoz  is a 72 y.o. male, with history of CAD status post stent placement x2 in 2007, recent Ramos test which was deemed low risk study, hyperlipidemia, morbid obesity, pulmonary fibrosis followed by pulmonology as outpatient came to hospital with chest pain on exertion.  Patient says that he has been having chest pain for past few months but today it got worse and usually he has to sit down for pain to get better.  He also has ongoing shortness of breath due to underlying pulmonary fibrosis. He denies chest pain at this time, no nausea vomiting or diarrhea Patient does not smoke cigarettes but he quit smoking in 1980s, has smoked 2 to 3 packs/day for 20 to 25 years. He denies dysuria Denies fever or chills. Denies abdominal pain. No previous history of stroke or seizures.    Review of systems:    In addition to the HPI above,   All other systems reviewed and are negative.   With Past History of the following :    Past Medical History:  Diagnosis Date  . Adrenal adenoma, left    small  . Aortic atherosclerosis ([place])   . Back pain   . Bilateral carotid artery stenosis followed by dr Claiborne Billings   per last carotid duplex [phone]  -- proximal LICA 72-09% ,  RICA 4-70%  . CAD (coronary artery disease) cardiologist-  dr t. Claiborne Billings   hx PTCA in 1999 to RCA & PDA/  10-11-2005  DES x2 to [place]  . Cardiomegaly    Stable  . Chronic constipation   . Chronic pain syndrome   . CKD (chronic kidney disease), stage III ([place])   . DDD (degenerative disc disease), cervical   . DDD (degenerative  disc disease), lumbosacral   . Dyspnea   . Dyspnea on exertion   . Gout    per pt last inflared episode 2015 approx.  . Grade II diastolic dysfunction   . H/O epistaxis    per pt sees ENT dr Benjamine Mola for cauterization (pt takes plavix)  . History of acute pancreatitis 08/08/2014  . History of kidney stones   . HTN (hypertension)   . Hyperlipidemia   . Interstitial lung disease ([place])   . Left arm pain    s/p fall  . Nocturia   . Numbness of right foot    due to DDD lumbar  . Peripheral edema   . Pilonidal cyst   . Pleural effusion on right   . Pulmonary fibrosis ([place])    followed by pcp-- last chest CT 04-08-2016  . S/P drug eluting coronary stent placement 10/11/2005   mLAD and dRCA  . Wears dentures    upper  .  Wears glasses       Past Surgical History:  Procedure Laterality Date  . ANTERIOR CERVICAL DECOMP/DISCECTOMY FUSION  12-25-2001     dr Orinda [facility]   C3 -- C7  . ANTERIOR CERVICAL DECOMP/DISCECTOMY FUSION  12-11-2012   dr Carloyn Manner  [facility])  . CARDIOVASCULAR STRESS TEST  08-23-2012    dr Claiborne Billings   normal lexiscan nuclear study w/ no ischemia/  normal LV function and wall motion, ef 65%  . CARDIOVASCULAR STRESS TEST    . CORONARY ANGIOPLASTY  1999   PTCA to RCA & PDA  . CORONARY ANGIOPLASTY WITH STENT PLACEMENT  10-11-2005  dr Shelva Majestic   PCI and stenting to [place] ([place] DES x2)  . CYSTOSCOPY W/ URETERAL STENT PLACEMENT Left 02-11-2010     [facility]  . LUMBAR LAMINECTOMY  2015   L5 -- S1  . PILONIDAL CYST EXCISION  1980s  . PILONIDAL CYST EXCISION N/A 03/31/2017   Procedure: EXCISION OF PILONIDAL DISEASE with Flap Rotation;  Surgeon: Leighton Ruff, MD;  Location: [facility];  Service: General;  Laterality: N/A;  . THROAT SURGERY  1996   per pt "removal piece of cryloid(?) that was pressing against throat"  states no issues since  . TRANSTHORACIC ECHOCARDIOGRAM  08-24-2010  dr Claiborne Billings   ef 50-55%/  mild MR/  trivial TR  . WOUND  DEBRIDEMENT N/A 10/06/2017   Procedure: DEBRIDEMENT WOUND PLACEMENT OF WOUND HEALING MATRIX;  Surgeon: Leighton Ruff, MD;  Location: [place];  Service: General;  Laterality: N/A;      Social History:      Social History   Tobacco Use  . Smoking status: Former Smoker    Packs/day: 3.00    Years: 25.00    Pack years: 75.00    Types: Cigarettes    Last attempt to quit: 11/27/1988    Years since quitting: 29.3  . Smokeless tobacco: Former Systems developer    Quit date: 03/28/1989  Substance Use Topics  . Alcohol use: No       Family History :     Family History  Problem Relation Age of Onset  . Cancer Father   . Heart attack Brother   . Diabetes Brother   . Colon cancer Neg Hx       Home Medications:   Prior to Admission medications   Medication Sig Start Date End Date Taking? Authorizing Provider  allopurinol (ZYLOPRIM) 300 MG tablet Take 300 mg by mouth every morning.  11/06/12  Yes [provider]  amLODipine (NORVASC) 5 MG tablet Take 1 tablet (5 mg total) by mouth daily. 08/03/17 04/11/18 Yes Troy Sine, MD  aspirin EC 81 MG tablet Take 81 mg by mouth daily.   Yes [provider]  cholecalciferol (VITAMIN D) 1000 units tablet Take 1,000 Units by mouth every morning.    Yes [provider]  clopidogrel (PLAVIX) 75 MG tablet TAKE ONE (1) TABLET BY MOUTH EVERY DAY Patient taking differently: Take 75 mg by mouth every other day.  09/16/17  Yes Troy Sine, MD  lisinopril (PRINIVIL,ZESTRIL) 5 MG tablet Take 5 mg by mouth every evening.    Yes [provider]  metoprolol tartrate (LOPRESSOR) 50 MG tablet TAKE ONE TABLET BY MOUTH TWICE A DAY Patient taking differently: Take 50 mg by mouth 2 (two) times daily.  03/16/18  Yes Troy Sine, MD  naproxen sodium (ALEVE) 220 MG tablet Take 220-440 mg by mouth daily  as needed (for pain).    Yes [provider]  nitroGLYCERIN (NITROSTAT) 0.4 MG SL tablet Place 1 tablet  (0.4 mg total) under the tongue every 5 (five) minutes as needed for chest pain. 08/03/17 04/11/18 Yes Troy Sine, MD  oxyCODONE-acetaminophen (PERCOCET) 10-325 MG per tablet Take 1 tablet by mouth every 8 (eight) hours as needed for pain.  10/11/12  Yes [provider]  simvastatin (ZOCOR) 20 MG tablet TAKE ONE TABLET BY MOUTH AT BEDTIME. Patient taking differently: Take 20 mg by mouth every other day. bedtime 09/26/17  Yes Troy Sine, MD  tiZANidine (ZANAFLEX) 4 MG tablet Take 4 mg by mouth at bedtime.  11/08/12  Yes [provider]  torsemide (DEMADEX) 20 MG tablet TAKE ONE (1) TABLET BY MOUTH EVERY DAY Patient taking differently: Take 20 mg by mouth daily as needed (for fluid).  09/16/17  Yes Troy Sine, MD  vitamin B-12 (CYANOCOBALAMIN) 1000 MCG tablet Take 1,000 mcg by mouth daily.   Yes [provider]     Allergies:     Allergies  Allergen Reactions  . Ibuprofen Itching    Motrin brand  . Neosporin [Neomycin-Bacitracin Zn-Polymyx] Swelling  . Penicillins Rash     Has patient had a PCN reaction causing immediate rash, facial/tongue/throat swelling, SOB or lightheadedness with hypotension: No Has patient had a PCN reaction causing severe rash involving mucus membranes or skin necrosis: No Has patient had a PCN reaction that required hospitalization: No Has patient had a PCN reaction occurring within the last 10 years: No If all of the above answers are "NO", then may proceed with Cephalosporin use.      Physical Exam:   Vitals  Blood pressure 139/69, pulse (!) 58, temperature 98.7 F (37.1 C), temperature source Oral, resp. rate 13, height 6' (1.829 m), weight 103.4 kg, SpO2 97 %.  1.  General: Appears in no acute distress  2. Psychiatric:  Intact judgement and  insight, awake alert, oriented x 3.  3. Neurologic: No focal neurological deficits, all cranial nerves intact.Strength 5/5 all 4 extremities, sensation intact all 4  extremities, plantars down going.  4. Eyes :  anicteric sclerae, moist conjunctivae with no lid lag. PERRLA.  5. ENMT:  Oropharynx clear with moist mucous membranes and good dentition  6. Neck:  supple, no cervical lymphadenopathy appriciated, No thyromegaly  7. Respiratory : Normal respiratory effort, good air movement bilaterally,clear to  auscultation bilaterally  8. Cardiovascular : RRR, no gallops, rubs or murmurs, bilateral 1+ pitting edema of lower extremities  9. Gastrointestinal:  Positive bowel sounds, abdomen soft, non-tender to palpation,no hepatosplenomegaly, no rigidity or guarding       10. Skin:  No cyanosis, normal texture and turgor, no rash, lesions or ulcers  11.Musculoskeletal:  Good muscle tone,  joints appear normal , no effusions,  normal range of motion    Data Review:    CBC Recent Labs  Lab 04/11/18 2113  WBC 8.1  HGB 14.4  HCT 45.1  PLT 222  MCV 90.4  MCH 28.9  MCHC 31.9  RDW 14.4   ------------------------------------------------------------------------------------------------------------------  Results for orders placed or performed during the hospital encounter of 04/11/18 (from the past 48 hour(s))  Basic metabolic panel     Status: None   Collection Time: 04/11/18  9:13 PM  Result Value Ref Range   Sodium 137 135 - 145 mmol/L   Potassium 4.0 3.5 - 5.1 mmol/L   Chloride 103 98 - 111 mmol/L  CO2 25 22 - 32 mmol/L   Glucose, Bld 94 70 - 99 mg/dL   BUN 22 8 - 23 mg/dL   Creatinine, Ser 1.11 0.61 - 1.24 mg/dL   Calcium 9.2 8.9 - 10.3 mg/dL   GFR calc non Af Amer >60 >60 mL/min   GFR calc Af Amer >60 >60 mL/min    Comment: (NOTE) The eGFR has been calculated using the CKD EPI equation. This calculation has not been validated in all clinical situations. eGFR's persistently <60 mL/min signify possible Chronic Kidney Disease.    Anion gap 9 5 - 15    Comment: Performed at [facility], [address], [place], [place] [address]    CBC     Status: None   Collection Time: 04/11/18  9:13 PM  Result Value Ref Range   WBC 8.1 4.0 - 10.5 K/uL   RBC 4.99 4.22 - 5.81 MIL/uL   Hemoglobin 14.4 13.0 - 17.0 g/dL   HCT 45.1 39.0 - 52.0 %   MCV 90.4 80.0 - 100.0 fL   MCH 28.9 26.0 - 34.0 pg   MCHC 31.9 30.0 - 36.0 g/dL   RDW 14.4 11.5 - 15.5 %   Platelets 222 150 - 400 K/uL   nRBC 0.0 0.0 - 0.2 %    Comment: Performed at [facility], [address], [place], [place] [address]  I-stat troponin, ED     Status: None   Collection Time: 04/11/18  9:22 PM  Result Value Ref Range   Troponin i, poc 0.00 0.00 - 0.08 ng/mL   Comment 3            Comment: Due to the release kinetics of cTnI, a negative result within the first hours of the onset of symptoms does not rule out myocardial infarction with certainty. If myocardial infarction is still suspected, repeat the test at appropriate intervals.     Chemistries  Recent Labs  Lab 04/11/18 2113  NA 137  K 4.0  CL 103  CO2 25  GLUCOSE 94  BUN 22  CREATININE 1.11  CALCIUM 9.2   ------------------------------------------------------------------------------------------------------------------  ------------------------------------------------------------------------------------------------------------------  --------------------------------------------------------------------------------------------------------------- Urine analysis:    Component Value Date/Time   COLORURINE YELLOW 03/08/2010 1403   APPEARANCEUR CLOUDY (A) 03/08/2010 1403   LABSPEC 1.012 03/08/2010 1403   PHURINE 5.5 03/08/2010 1403   GLUCOSEU NEGATIVE 03/08/2010 1403   HGBUR TRACE (A) 03/08/2010 1403   BILIRUBINUR NEGATIVE 03/08/2010 1403   KETONESUR NEGATIVE 03/08/2010 1403   PROTEINUR 100 (A) 03/08/2010 1403   UROBILINOGEN 0.2 03/08/2010 1403   NITRITE NEGATIVE 03/08/2010 1403   LEUKOCYTESUR NEGATIVE 03/08/2010 1403      Imaging Results:    Dg Chest 2 View  Result Date:  04/11/2018 CLINICAL DATA:  Chest pain EXAM: CHEST - 2 VIEW COMPARISON:  CT 04/06/2017 FINDINGS: Rounded masslike area noted in the left hilar/AP window region, not definitively seen on prior study. Heart is mildly enlarged. No effusions. Diffuse interstitial prominence throughout the lungs compatible with chronic interstitial lung disease. IMPRESSION: Large masslike area in the left hilar/AP window region. Recommend further evaluation with chest CT with IV contrast. Cardiomegaly. Chronic interstitial lung disease. Electronically Signed   By: Rolm Baptise M.D.   On: 04/11/2018 22:48   Dg Shoulder Left  Result Date: 04/11/2018 CLINICAL DATA:  Left chest pain, shoulder pain EXAM: LEFT SHOULDER - 2+ VIEW COMPARISON:  None. FINDINGS: Degenerative changes in the left [facility] and glenohumeral joints with joint space narrowing and spurring. No acute bony  abnormality. Specifically, no fracture, subluxation, or dislocation. IMPRESSION: Degenerative changes.  No acute bony abnormality. Electronically Signed   By: Rolm Baptise M.D.   On: 04/11/2018 22:49    My personal review of EKG: Rhythm NSR   Assessment & Plan:    Active Problems:   Chest pain   1. Chest pain-patient presented with chest pain on exertion, recent negative Lexiscan Myoview.  Troponin is negative in the ED.  Will place under observation under telemetry to rule out acute coronary syndrome.  Obtain serial troponin every 6 hours x3.  2. Masslike lesion-large masslike lesion seen in the left hilar/AP window region on chest x-ray.  This is a new change as compared to previous imaging studies.  Will obtain CT chest with contrast to rule out underlying malignancy.  3. Hypertension-blood pressure is stable, continue lisinopril, Lopressor, amlodipine  4. CAD status post stent x2-stable, continue aspirin, Plavix, Zocor    DVT Prophylaxis-   Lovenox   AM Labs Ordered, also please review Full Orders  Family Communication: Admission, patients  condition and plan of care including tests being ordered have been discussed with the patient and his wife at bedside who indicate understanding and agree with the plan and Code Status.  Code Status: Full code  Admission status: Observation  Time spent in minutes : 60 minutes   Oswald Hillock M.D on 04/11/2018 at 11:28 PM  Between 7am to 7pm - Pager - [phone]. After 7pm go to [email or web address] - password [facility]  [facility] - Office  [phone]

## 2018-04-11 NOTE — ED Triage Notes (Signed)
Pt c/o chest pain for the last couple of days that has gotten worse today; pt states he has left arm pain already and is unsure if the arm pain is related to the chest pain; pt also c/o headache with the chest pain; pt states he took one nitro SL at 4pm today and had some relief from the pain; pt states he is having some sob; pt states the pain is worse with exertion

## 2018-04-11 NOTE — ED Notes (Signed)
Patient transported to CT 

## 2018-04-11 NOTE — ED Provider Notes (Signed)
[facility] EMERGENCY DEPARTMENT Provider Note   CSN: [id number] Arrival date & time: 04/11/18  2101     History   Chief Complaint Chief Complaint  Patient presents with  . Chest Pain    HPI ALIOU Maurice Munoz is a 72 y.o. male.  HPI Patient with history of coronary artery disease status post stenting presents with 3 days of central chest pain associated with minimal exertion.  States it is relieved with rest.  Has chronic shortness of breath which is unchanged.  Also states he has chronic lower extremity swelling which is again unchanged.  Patient fell onto his left shoulder several months ago and has had ongoing left shoulder pain.  He is been seen by an orthopedist.  States he does get some numbness to bilateral hands.  Currently denies any chest pain. Past Medical History:  Diagnosis Date  . Adrenal adenoma, left    small  . Aortic atherosclerosis ([place])   . Back pain   . Bilateral carotid artery stenosis followed by dr Claiborne Billings   per last carotid duplex [id number]  -- proximal LICA 68-12% ,  RICA 7-51%  . CAD (coronary artery disease) cardiologist-  dr t. Claiborne Billings   hx PTCA in 1999 to RCA & PDA/  10-11-2005  DES x2 to [place]  . Cardiomegaly    Stable  . Chronic constipation   . Chronic pain syndrome   . CKD (chronic kidney disease), stage III ([place])   . DDD (degenerative disc disease), cervical   . DDD (degenerative disc disease), lumbosacral   . Dyspnea   . Dyspnea on exertion   . Gout    per pt last inflared episode 2015 approx.  . Grade II diastolic dysfunction   . H/O epistaxis    per pt sees ENT dr Benjamine Mola for cauterization (pt takes plavix)  . History of acute pancreatitis 08/08/2014  . History of kidney stones   . HTN (hypertension)   . Hyperlipidemia   . Interstitial lung disease ([place])   . Left arm pain    s/p fall  . Nocturia   . Numbness of right foot    due to DDD lumbar  . Peripheral edema   . Pilonidal cyst   . Pleural effusion on right   .  Pulmonary fibrosis ([place])    followed by pcp-- last chest CT 04-08-2016  . S/P drug eluting coronary stent placement 10/11/2005   mLAD and dRCA  . Wears dentures    upper  . Wears glasses     Patient Active Problem List   Diagnosis Date Noted  . Chest pain 04/11/2018  . Hyperlipidemia LDL goal <70 09/25/2014  . Lower extremity edema 09/25/2014  . Nausea   . Upper abdominal pain   . Pancreatitis-Idiopathy acute 2.18.16 admitted [facility] 08/08/2014  . Gout 08/08/2014  . CAD s/p stenting 2007, prior LAD h/o 199, last myoview 3/15 Low risk 11/27/2012  . Edema 11/27/2012  . Obesity (BMI 30-39.9) 11/27/2012  . Cervical disc disease s/p diskecktomy 2003  11/27/2012  . GOUT 10/13/2009  . KNEE PAIN 09/08/2009  . PATELLAR TENDINITIS 09/08/2009    Past Surgical History:  Procedure Laterality Date  . ANTERIOR CERVICAL DECOMP/DISCECTOMY FUSION  12-25-2001     dr Orinda Kenner [facility]   C3 -- C7  . ANTERIOR CERVICAL DECOMP/DISCECTOMY FUSION  12-11-2012   dr Carloyn Manner  [facility])  . CARDIOVASCULAR STRESS TEST  08-23-2012    dr Claiborne Billings   normal lexiscan nuclear study w/  no ischemia/  normal LV function and wall motion, ef 65%  . CARDIOVASCULAR STRESS TEST    . CORONARY ANGIOPLASTY  1999   PTCA to RCA & PDA  . CORONARY ANGIOPLASTY WITH STENT PLACEMENT  10-11-2005  dr Shelva Majestic   PCI and stenting to [place] ([place] DES x2)  . CYSTOSCOPY W/ URETERAL STENT PLACEMENT Left 02-11-2010     [facility]  . LUMBAR LAMINECTOMY  2015   L5 -- S1  . PILONIDAL CYST EXCISION  1980s  . PILONIDAL CYST EXCISION N/A 03/31/2017   Procedure: EXCISION OF PILONIDAL DISEASE with Flap Rotation;  Surgeon: Leighton Ruff, MD;  Location: [place];  Service: General;  Laterality: N/A;  . THROAT SURGERY  1996   per pt "removal piece of cryloid(?) that was pressing against throat"  states no issues since  . TRANSTHORACIC ECHOCARDIOGRAM  08-24-2010  dr Claiborne Billings   ef 50-55%/  mild MR/  trivial TR  . WOUND  DEBRIDEMENT N/A 10/06/2017   Procedure: DEBRIDEMENT WOUND PLACEMENT OF WOUND HEALING MATRIX;  Surgeon: Leighton Ruff, MD;  Location: [place];  Service: General;  Laterality: N/A;        Home Medications    Prior to Admission medications   Medication Sig Start Date End Date Taking? Authorizing Provider  allopurinol (ZYLOPRIM) 300 MG tablet Take 300 mg by mouth every morning.  11/06/12  Yes [provider]  amLODipine (NORVASC) 5 MG tablet Take 1 tablet (5 mg total) by mouth daily. 08/03/17 04/11/18 Yes Troy Sine, MD  aspirin EC 81 MG tablet Take 81 mg by mouth daily.   Yes [provider]  cholecalciferol (VITAMIN D) 1000 units tablet Take 1,000 Units by mouth every morning.    Yes [provider]  clopidogrel (PLAVIX) 75 MG tablet TAKE ONE (1) TABLET BY MOUTH EVERY DAY Patient taking differently: Take 75 mg by mouth every other day.  09/16/17  Yes Troy Sine, MD  lisinopril (PRINIVIL,ZESTRIL) 5 MG tablet Take 5 mg by mouth every evening.    Yes [provider]  metoprolol tartrate (LOPRESSOR) 50 MG tablet TAKE ONE TABLET BY MOUTH TWICE A DAY Patient taking differently: Take 50 mg by mouth 2 (two) times daily.  03/16/18  Yes Troy Sine, MD  naproxen sodium (ALEVE) 220 MG tablet Take 220-440 mg by mouth daily as needed (for pain).    Yes [provider]  nitroGLYCERIN (NITROSTAT) 0.4 MG SL tablet Place 1 tablet (0.4 mg total) under the tongue every 5 (five) minutes as needed for chest pain. 08/03/17 04/11/18 Yes Troy Sine, MD  oxyCODONE-acetaminophen (PERCOCET) 10-325 MG per tablet Take 1 tablet by mouth every 8 (eight) hours as needed for pain.  10/11/12  Yes [provider]  simvastatin (ZOCOR) 20 MG tablet TAKE ONE TABLET BY MOUTH AT BEDTIME. Patient taking differently: Take 20 mg by mouth every other day. bedtime 09/26/17  Yes Troy Sine, MD  tiZANidine (ZANAFLEX) 4 MG tablet Take 4 mg by mouth at  bedtime.  11/08/12  Yes [provider]  torsemide (DEMADEX) 20 MG tablet TAKE ONE (1) TABLET BY MOUTH EVERY DAY Patient taking differently: Take 20 mg by mouth daily as needed (for fluid).  09/16/17  Yes Troy Sine, MD  vitamin B-12 (CYANOCOBALAMIN) 1000 MCG tablet Take 1,000 mcg by mouth daily.   Yes [provider]    Family History Family History  Problem Relation Age of Onset  .  Cancer Father   . Heart attack Brother   . Diabetes Brother   . Colon cancer Neg Hx     Social History Social History   Tobacco Use  . Smoking status: Former Smoker    Packs/day: 3.00    Years: 25.00    Pack years: 75.00    Types: Cigarettes    Last attempt to quit: 11/27/1988    Years since quitting: 29.3  . Smokeless tobacco: Former Systems developer    Quit date: 03/28/1989  Substance Use Topics  . Alcohol use: No  . Drug use: No     Allergies   Ibuprofen; Neosporin [neomycin-bacitracin zn-polymyx]; and Penicillins   Review of Systems Review of Systems  Constitutional: Negative for chills and fever.  HENT: Negative for sore throat and trouble swallowing.   Eyes: Negative for visual disturbance.  Respiratory: Positive for shortness of breath. Negative for cough.   Cardiovascular: Positive for chest pain and leg swelling. Negative for palpitations.  Gastrointestinal: Negative for abdominal pain, constipation, diarrhea, nausea and vomiting.  Musculoskeletal: Positive for arthralgias. Negative for joint swelling, myalgias and neck pain.  Skin: Negative for rash and wound.  Neurological: Negative for dizziness, weakness, light-headedness, numbness and headaches.  All other systems reviewed and are negative.    Physical Exam Updated Vital Signs BP (!) 118/54   Pulse (!) 57   Temp 98.7 F (37.1 C) (Oral)   Resp 14   Ht 6' (1.829 m)   Wt 103.4 kg   SpO2 95%   BMI 30.92 kg/m   Physical Exam  Constitutional: He is oriented to person, place, and time. He appears  well-developed and well-nourished. No distress.  HENT:  Head: Normocephalic and atraumatic.  Mouth/Throat: Oropharynx is clear and moist. No oropharyngeal exudate.  Eyes: Pupils are equal, round, and reactive to light. Conjunctivae and EOM are normal.  Neck: Normal range of motion. Neck supple. No JVD present.  No posterior midline cervical tenderness to palpation.  Cardiovascular: Normal rate and regular rhythm. Exam reveals no gallop and no friction rub.  No murmur heard. Pulmonary/Chest: Effort normal and breath sounds normal. No stridor. No respiratory distress. He has no wheezes. He has no rales. He exhibits no tenderness.  Abdominal: Soft. Bowel sounds are normal. There is no tenderness. There is no rebound and no guarding.  Musculoskeletal: Normal range of motion. He exhibits tenderness. He exhibits no edema.  Patient with minimal anterior deltoid tenderness and pain with range of motion of the left shoulder.  2+ bilateral lower extremity pitting edema.  Distal pulses intact.  No midline thoracic or lumbar tenderness.  Lymphadenopathy:    He has no cervical adenopathy.  Neurological: He is alert and oriented to person, place, and time.  5/5 motor in bilateral upper and lower extremities.  Decreased sensation right hand otherwise sensation intact.  Skin: Skin is warm and dry. Capillary refill takes less than 2 seconds. No rash noted. He is not diaphoretic. No erythema.  Psychiatric: He has a normal mood and affect. His behavior is normal.  Nursing note and vitals reviewed.    ED Treatments / Results  Labs (all labs ordered are listed, but only abnormal results are displayed) Labs Reviewed  BASIC METABOLIC PANEL  CBC  I-STAT TROPONIN, ED    EKG EKG Interpretation  Date/Time:  Tuesday April 11 2018 21:12:00 EDT Ventricular Rate:  65 PR Interval:    QRS Duration: 91 QT Interval:  425 QTC Calculation: 442 R Axis:   64 Text Interpretation:  Sinus rhythm Consider left  atrial enlargement No significant change was found Confirmed by Julianne Rice [phone]) on 04/11/2018 9:52:46 PM   Radiology Dg Chest 2 View  Result Date: 04/11/2018 CLINICAL DATA:  Chest pain EXAM: CHEST - 2 VIEW COMPARISON:  CT 04/06/2017 FINDINGS: Rounded masslike area noted in the left hilar/AP window region, not definitively seen on prior study. Heart is mildly enlarged. No effusions. Diffuse interstitial prominence throughout the lungs compatible with chronic interstitial lung disease. IMPRESSION: Large masslike area in the left hilar/AP window region. Recommend further evaluation with chest CT with IV contrast. Cardiomegaly. Chronic interstitial lung disease. Electronically Signed   By: Rolm Baptise M.D.   On: 04/11/2018 22:48   Dg Shoulder Left  Result Date: 04/11/2018 CLINICAL DATA:  Left chest pain, shoulder pain EXAM: LEFT SHOULDER - 2+ VIEW COMPARISON:  None. FINDINGS: Degenerative changes in the left [facility] and glenohumeral joints with joint space narrowing and spurring. No acute bony abnormality. Specifically, no fracture, subluxation, or dislocation. IMPRESSION: Degenerative changes.  No acute bony abnormality. Electronically Signed   By: Rolm Baptise M.D.   On: 04/11/2018 22:49    Procedures Procedures (including critical care time)  Medications Ordered in ED Medications  aspirin chewable tablet 324 mg (324 mg Oral Given 04/11/18 2214)     Initial Impression / Assessment and Plan / ED Course  I have reviewed the triage vital signs and the nursing notes.  Pertinent labs & imaging results that were available during my care of the patient were reviewed by me and considered in my medical decision making (see chart for details).    Remains chest pain-free.  Given aspirin in emergency department.  Initial troponin is normal.  EKG without ischemic findings.  Discussed with hospitalist who will admit   Final Clinical Impressions(s) / ED Diagnoses   Final diagnoses:  Unstable  angina [facility])    ED Discharge Orders    None       Julianne Rice, MD 04/11/18 2258

## 2018-04-12 ENCOUNTER — Other Ambulatory Visit: Payer: Self-pay

## 2018-04-12 ENCOUNTER — Encounter (HOSPITAL_COMMUNITY): Payer: Self-pay | Admitting: Student

## 2018-04-12 ENCOUNTER — Ambulatory Visit (HOSPITAL_COMMUNITY): Admit: 2018-04-12 | Payer: Medicare Other | Admitting: Cardiology

## 2018-04-12 ENCOUNTER — Encounter (HOSPITAL_COMMUNITY)
Admission: EM | Disposition: A | Discharge status: Home / Self Care | Payer: Self-pay | Source: Home / Self Care | Attending: Cardiovascular Disease

## 2018-04-12 DIAGNOSIS — I10 Essential (primary) hypertension: Secondary | ICD-10-CM | POA: Diagnosis not present

## 2018-04-12 DIAGNOSIS — G8929 Other chronic pain: Secondary | ICD-10-CM | POA: Diagnosis not present

## 2018-04-12 DIAGNOSIS — R079 Chest pain, unspecified: Secondary | ICD-10-CM | POA: Diagnosis not present

## 2018-04-12 DIAGNOSIS — E785 Hyperlipidemia, unspecified: Secondary | ICD-10-CM

## 2018-04-12 DIAGNOSIS — N183 Chronic kidney disease, stage 3 unspecified: Secondary | ICD-10-CM | POA: Insufficient documentation

## 2018-04-12 DIAGNOSIS — J8410 Pulmonary fibrosis, unspecified: Secondary | ICD-10-CM | POA: Diagnosis not present

## 2018-04-12 DIAGNOSIS — I200 Unstable angina: Secondary | ICD-10-CM

## 2018-04-12 DIAGNOSIS — J849 Interstitial pulmonary disease, unspecified: Secondary | ICD-10-CM | POA: Diagnosis not present

## 2018-04-12 DIAGNOSIS — M25512 Pain in left shoulder: Secondary | ICD-10-CM

## 2018-04-12 DIAGNOSIS — I25110 Atherosclerotic heart disease of native coronary artery with unstable angina pectoris: Secondary | ICD-10-CM | POA: Diagnosis not present

## 2018-04-12 DIAGNOSIS — Z955 Presence of coronary angioplasty implant and graft: Secondary | ICD-10-CM

## 2018-04-12 DIAGNOSIS — J84112 Idiopathic pulmonary fibrosis: Secondary | ICD-10-CM

## 2018-04-12 HISTORY — PX: LEFT HEART CATH AND CORONARY ANGIOGRAPHY: CATH118249

## 2018-04-12 LAB — COMPREHENSIVE METABOLIC PANEL
ALBUMIN: 3.7 g/dL (ref 3.5–5.0)
ALK PHOS: 72 U/L (ref 38–126)
ALT: 14 U/L (ref 0–44)
ANION GAP: 6 (ref 5–15)
AST: 20 U/L (ref 15–41)
BILIRUBIN TOTAL: 1.3 mg/dL — AB (ref 0.3–1.2)
BUN: 20 mg/dL (ref 8–23)
CALCIUM: 8.9 mg/dL (ref 8.9–10.3)
CO2: 27 mmol/L (ref 22–32)
CREATININE: 1.06 mg/dL (ref 0.61–1.24)
Chloride: 106 mmol/L (ref 98–111)
GFR calc non Af Amer: >60 mL/min (ref >60)
GLUCOSE: 85 mg/dL (ref 70–99)
Potassium: 3.7 mmol/L (ref 3.5–5.1)
Sodium: 139 mmol/L (ref 135–145)
TOTAL PROTEIN: 6.3 g/dL — AB (ref 6.5–8.1)

## 2018-04-12 LAB — CBC
HEMATOCRIT: 41.5 % (ref 39.0–52.0)
Hemoglobin: 13.3 g/dL (ref 13.0–17.0)
MCH: 29.2 pg (ref 26.0–34.0)
MCHC: 32 g/dL (ref 30.0–36.0)
MCV: 91 fL (ref 80.0–100.0)
Platelets: 184 10*3/uL (ref 150–400)
RBC: 4.56 MIL/uL (ref 4.22–5.81)
RDW: 14.3 % (ref 11.5–15.5)
WBC: 6 10*3/uL (ref 4.0–10.5)
nRBC: 0 % (ref 0.0–0.2)

## 2018-04-12 LAB — BRAIN NATRIURETIC PEPTIDE: B Natriuretic Peptide: 195 pg/mL — ABNORMAL HIGH (ref 0.0–100.0)

## 2018-04-12 LAB — TROPONIN I
Troponin I: 0.04 ng/mL (ref <0.03)
Troponin I: 0.06 ng/mL (ref <0.03)
Troponin I: 0.06 ng/mL (ref <0.03)

## 2018-04-12 SURGERY — LEFT HEART CATH AND CORONARY ANGIOGRAPHY
Anesthesia: LOCAL

## 2018-04-12 MED ORDER — OXYCODONE HCL 5 MG PO TABS
5.0000 mg | ORAL_TABLET | Freq: Three times a day (TID) | ORAL | Status: DC | PRN
  Administered 2018-04-13 – 2018-04-14 (×3): 5 mg via ORAL
  Filled 2018-04-12 (×3): qty 1

## 2018-04-12 MED ORDER — MIDAZOLAM HCL 2 MG/2ML IJ SOLN
INTRAMUSCULAR | Status: DC | PRN
  Administered 2018-04-12: 2 mg via INTRAVENOUS

## 2018-04-12 MED ORDER — SODIUM CHLORIDE 0.9% FLUSH: 3.0000 mL | INTRAVENOUS | Status: DC | PRN

## 2018-04-12 MED ORDER — LIDOCAINE HCL (PF) 1 % IJ SOLN
INTRAMUSCULAR | Status: DC | PRN
  Administered 2018-04-12: 2 mL

## 2018-04-12 MED ORDER — VERAPAMIL HCL 2.5 MG/ML IV SOLN
INTRAVENOUS | Status: AC
  Filled 2018-04-12: qty 2

## 2018-04-12 MED ORDER — NITROGLYCERIN 1 MG/10 ML FOR IR/CATH LAB
INTRA_ARTERIAL | Status: AC
  Filled 2018-04-12: qty 10

## 2018-04-12 MED ORDER — VERAPAMIL HCL 2.5 MG/ML IV SOLN
INTRAVENOUS | Status: DC | PRN
  Administered 2018-04-12: 10 mL via INTRA_ARTERIAL

## 2018-04-12 MED ORDER — ACETAMINOPHEN 325 MG PO TABS: 650.0000 mg | ORAL_TABLET | ORAL | Status: DC | PRN

## 2018-04-12 MED ORDER — HEPARIN (PORCINE) IN NACL 1000-0.9 UT/500ML-% IV SOLN
INTRAVENOUS | Status: DC | PRN
  Administered 2018-04-12 (×2): 500 mL

## 2018-04-12 MED ORDER — HEPARIN SODIUM (PORCINE) 1000 UNIT/ML IJ SOLN
INTRAMUSCULAR | Status: DC | PRN
  Administered 2018-04-12: 5000 [IU] via INTRAVENOUS

## 2018-04-12 MED ORDER — SODIUM CHLORIDE 0.9 % IV SOLN: 250.0000 mL | INTRAVENOUS | Status: DC | PRN

## 2018-04-12 MED ORDER — FENTANYL CITRATE (PF) 100 MCG/2ML IJ SOLN
INTRAMUSCULAR | Status: DC | PRN
  Administered 2018-04-12: 50 ug via INTRAVENOUS
  Administered 2018-04-12: 25 ug via INTRAVENOUS

## 2018-04-12 MED ORDER — HEPARIN (PORCINE) IN NACL 100-0.45 UNIT/ML-% IJ SOLN
1600.0000 [IU]/h | INTRAMUSCULAR | Status: DC
  Administered 2018-04-13: 1400 [IU]/h via INTRAVENOUS
  Administered 2018-04-13: 1500 [IU]/h via INTRAVENOUS
  Filled 2018-04-12 (×2): qty 250

## 2018-04-12 MED ORDER — ONDANSETRON HCL 4 MG/2ML IJ SOLN: 4.0000 mg | Freq: Four times a day (QID) | INTRAMUSCULAR | Status: DC | PRN

## 2018-04-12 MED ORDER — HEPARIN (PORCINE) IN NACL 100-0.45 UNIT/ML-% IJ SOLN: 1400.0000 [IU]/h | INTRAMUSCULAR | Status: DC

## 2018-04-12 MED ORDER — OXYCODONE-ACETAMINOPHEN 5-325 MG PO TABS
1.0000 | ORAL_TABLET | ORAL | Status: AC | PRN
  Administered 2018-04-12: 2 via ORAL
  Filled 2018-04-12: qty 2

## 2018-04-12 MED ORDER — SODIUM CHLORIDE 0.9 % IV SOLN: INTRAVENOUS | Status: AC

## 2018-04-12 MED ORDER — SODIUM CHLORIDE 0.9% FLUSH
3.0000 mL | Freq: Two times a day (BID) | INTRAVENOUS | Status: DC
  Administered 2018-04-13 – 2018-04-14 (×2): 3 mL via INTRAVENOUS

## 2018-04-12 MED ORDER — ASPIRIN 81 MG PO CHEW: 81.0000 mg | CHEWABLE_TABLET | Freq: Every day | ORAL | Status: DC

## 2018-04-12 MED ORDER — FENTANYL CITRATE (PF) 100 MCG/2ML IJ SOLN
INTRAMUSCULAR | Status: AC
  Filled 2018-04-12: qty 2

## 2018-04-12 MED ORDER — IOHEXOL 350 MG/ML SOLN
INTRAVENOUS | Status: DC | PRN
  Administered 2018-04-12: 50 mL

## 2018-04-12 MED ORDER — HEPARIN (PORCINE) IN NACL 100-0.45 UNIT/ML-% IJ SOLN
1400.0000 [IU]/h | INTRAMUSCULAR | Status: DC
  Administered 2018-04-12: 1400 [IU]/h via INTRAVENOUS
  Filled 2018-04-12: qty 250

## 2018-04-12 MED ORDER — LIDOCAINE HCL (PF) 1 % IJ SOLN
INTRAMUSCULAR | Status: AC
  Filled 2018-04-12: qty 30

## 2018-04-12 MED ORDER — SODIUM CHLORIDE 0.9 % IV SOLN
INTRAVENOUS | Status: DC
  Administered 2018-04-12: 12:00:00 via INTRAVENOUS

## 2018-04-12 MED ORDER — HEPARIN BOLUS VIA INFUSION
4000.0000 [IU] | Freq: Once | INTRAVENOUS | Status: AC
  Administered 2018-04-12: 4000 [IU] via INTRAVENOUS
  Filled 2018-04-12: qty 4000

## 2018-04-12 MED ORDER — MIDAZOLAM HCL 2 MG/2ML IJ SOLN
INTRAMUSCULAR | Status: AC
  Filled 2018-04-12: qty 2

## 2018-04-12 MED ORDER — OXYCODONE-ACETAMINOPHEN 5-325 MG PO TABS
1.0000 | ORAL_TABLET | Freq: Three times a day (TID) | ORAL | Status: DC | PRN
  Administered 2018-04-13 – 2018-04-14 (×3): 1 via ORAL
  Filled 2018-04-12 (×3): qty 1

## 2018-04-12 MED ORDER — HEPARIN (PORCINE) IN NACL 1000-0.9 UT/500ML-% IV SOLN
INTRAVENOUS | Status: AC
  Filled 2018-04-12: qty 1000

## 2018-04-12 SURGICAL SUPPLY — 9 items

## 2018-04-12 NOTE — ED Notes (Signed)
Date and time results received: 04/12/18  00:56 (use smartphrase ".now" to insert current time)  Test: troponin Critical Value: 0.06  Name of Provider Notified: Dr. Darrick Meigs [facility]  Orders Received? Or Actions Taken?: no/na

## 2018-04-12 NOTE — Progress Notes (Signed)
ANTICOAGULATION CONSULT NOTE - Initial Consult  Pharmacy Consult for heparin Indication: chest pain/ACS  Allergies  Allergen Reactions  . Ibuprofen Itching    Motrin brand  . Neosporin [Neomycin-Bacitracin Zn-Polymyx] Swelling  . Penicillins Rash     Has patient had a PCN reaction causing immediate rash, facial/tongue/throat swelling, SOB or lightheadedness with hypotension: No Has patient had a PCN reaction causing severe rash involving mucus membranes or skin necrosis: No Has patient had a PCN reaction that required hospitalization: No Has patient had a PCN reaction occurring within the last 10 years: No If all of the above answers are "NO", then may proceed with Cephalosporin use.     Patient Measurements: Height: 6' (182.9 cm) Weight: 244 lb 14.9 oz (111.1 kg) IBW/kg (Calculated) : 77.6 Heparin Dosing Weight: 101 kg  Vital Signs: Temp: 98 F (36.7 C) (10/23 0934) Temp Source: Oral (10/23 0934) BP: 156/75 (10/23 0951) Pulse Rate: 79 (10/23 0934)  Labs: Recent Labs    04/11/18 2113 04/12/18 0005 04/12/18 0623  HGB 14.4  --  13.3  HCT 45.1  --  41.5  PLT 222  --  184  CREATININE 1.11  --  1.06  TROPONINI  --  0.06* 0.06*    Estimated Creatinine Clearance: 83.5 mL/min (by C-G formula based on SCr of 1.06 mg/dL).   Medical History: Past Medical History:  Diagnosis Date  . Adrenal adenoma, left    small  . Aortic atherosclerosis ([place])   . Back pain   . Bilateral carotid artery stenosis followed by dr Claiborne Billings   per last carotid duplex [id number]  -- proximal LICA 08-14% ,  RICA 4-81%  . CAD (coronary artery disease)    a. s/p prior intervention to RCA and PDA in 1999 b. DES to LAD and distal RCA in 2007  . Cardiomegaly    Stable  . Chronic constipation   . Chronic pain syndrome   . CKD (chronic kidney disease), stage III ([place])   . DDD (degenerative disc disease), cervical   . DDD (degenerative disc disease), lumbosacral   . Dyspnea   . Dyspnea on exertion    . Gout    per pt last inflared episode 2015 approx.  . Grade II diastolic dysfunction   . H/O epistaxis    per pt sees ENT dr Benjamine Mola for cauterization (pt takes plavix)  . History of acute pancreatitis 08/08/2014  . History of kidney stones   . HTN (hypertension)   . Hyperlipidemia   . Interstitial lung disease ([place])   . Left arm pain    s/p fall  . Nocturia   . Numbness of right foot    due to DDD lumbar  . Peripheral edema   . Pilonidal cyst   . Pleural effusion on right   . Pulmonary fibrosis ([place])    followed by pcp-- last chest CT 04-08-2016  . S/P drug eluting coronary stent placement 10/11/2005   mLAD and dRCA  . Wears dentures    upper  . Wears glasses     Medications:  Medications Prior to Admission  Medication Sig Dispense Refill Last Dose  . allopurinol (ZYLOPRIM) 300 MG tablet Take 300 mg by mouth every morning.    04/11/2018 at Unknown time  . amLODipine (NORVASC) 5 MG tablet Take 1 tablet (5 mg total) by mouth daily. 90 tablet 3 04/11/2018 at Unknown time  . aspirin EC 81 MG tablet Take 81 mg by mouth daily.   04/11/2018 at Unknown time  .  cholecalciferol (VITAMIN D) 1000 units tablet Take 1,000 Units by mouth every morning.    04/11/2018 at Unknown time  . clopidogrel (PLAVIX) 75 MG tablet TAKE ONE (1) TABLET BY MOUTH EVERY DAY (Patient taking differently: Take 75 mg by mouth every other day. ) 90 tablet 1 04/10/2018 at Unknown time  . lisinopril (PRINIVIL,ZESTRIL) 5 MG tablet Take 5 mg by mouth every evening.    04/10/2018 at Unknown time  . metoprolol tartrate (LOPRESSOR) 50 MG tablet TAKE ONE TABLET BY MOUTH TWICE A DAY (Patient taking differently: Take 50 mg by mouth 2 (two) times daily. ) 180 tablet 1 04/11/2018 at [place]  . naproxen sodium (ALEVE) 220 MG tablet Take 220-440 mg by mouth daily as needed (for pain).    04/11/2018 at Unknown time  . nitroGLYCERIN (NITROSTAT) 0.4 MG SL tablet Place 1 tablet (0.4 mg total) under the tongue every 5 (five) minutes as  needed for chest pain. 25 tablet 3 04/11/2018 at 1600  . oxyCODONE-acetaminophen (PERCOCET) 10-325 MG per tablet Take 1 tablet by mouth every 8 (eight) hours as needed for pain.    04/11/2018 at Unknown time  . simvastatin (ZOCOR) 20 MG tablet TAKE ONE TABLET BY MOUTH AT BEDTIME. (Patient taking differently: Take 20 mg by mouth every other day. bedtime) 90 tablet 3 04/10/2018 at Unknown time  . tiZANidine (ZANAFLEX) 4 MG tablet Take 4 mg by mouth at bedtime.    04/10/2018 at Unknown time  . torsemide (DEMADEX) 20 MG tablet TAKE ONE (1) TABLET BY MOUTH EVERY DAY (Patient taking differently: Take 20 mg by mouth daily as needed (for fluid). ) 90 tablet 1 unknown  . vitamin B-12 (CYANOCOBALAMIN) 1000 MCG tablet Take 1,000 mcg by mouth daily.   04/11/2018 at Unknown time    Assessment: Pharmacy consulted to dose heparin in patient with chest pain/unstable angina.  Goal of Therapy:  Heparin level 0.3-0.7 units/ml Monitor platelets by anticoagulation protocol: Yes   Plan:  Give 4000 units bolus x 1 Start heparin infusion at 1400 units/hr Check anti-Xa level in 6-8 hours and daily while on heparin Continue to monitor H&H and platelets  Revonda Standard [place] Sites 04/12/2018,10:27 AM

## 2018-04-12 NOTE — H&P (View-Only) (Signed)
Cardiology Consult    Patient ID: SAL SPRATLEY; [id number]; 05/24/1946   Admit date: 04/11/2018 Date of Consult: 04/12/2018  Primary Care Provider: Redmond School, MD Primary Cardiologist: Shelva Majestic, MD   Patient Profile    Maurice Munoz is a 72 y.o. male with past medical history of CAD (s/p prior intervention to RCA and PDA in 1999, DES to LAD and distal RCA in 2007, low-risk NST in 07/2017), ILD, HTN, HLD, and Stage 3 CKD who is being seen today for the evaluation of chest pain at the request of Dr. Darrick Meigs.   History of Present Illness    Maurice Munoz was last examined by Dr. Claiborne Billings in 08/2017 and denied any recurrent chest discomfort since undergoing the stress test the previous month. He was planning to undergo upcoming surgery for a pilonidal cyst and was cleared for surgery and informed to hold Plavix for 5 days prior to the procedure. This was performed in 09/2017 with no immediate complication noted.   He presented to [facility] ED on 04/11/2018 for evaluation of chest discomfort. He reports being in his usual state of health over the past several months but starting approximately 3 days ago, he started to have chest discomfort with minimal activity such as walking 20 to 30 feet. He reports symptoms would occur with walking and then improve as soon as he would sit down. Reports associated dyspnea at that time. He has experienced pain along his left shoulder since a fall several months ago and is unsure if the pain along his left arm has worsened during this timeframe. He continued to have intermittent episodes of chest discomfort yesterday when working on his fishing boat and again symptoms would resolve with rest. He did utilize sublingual nitroglycerin x1 with improvement of his symptoms. Notes that his pain is more severe than when he underwent stress testing in 07/2017. Unsure if symptoms resemble prior angina as he did not have chest pain prior to undergoing stent placement  in 2007.  He denies any recent orthopnea, PND, palpitations, lightheadedness, dizziness, or presyncope. Does have chronic lower extremity edema which is been unchanged by his report. He reports a 40+ pound intentional weight loss over the past several months which he equates to changes in his dietary habits.  Initial labs showed WBC 8.1, Hgb 14.4, platelets 222, Na+ 137, K+ 4.0, and creatinine 1.11. BNP slightly elevated to 195. I-STAT troponin negative with cyclic troponin I values being flat at 0.06. EKG shows NSR, HR 65, with no acute ST changes when compared to prior tracings. CXR showed a large masslike area in the left hilar region with chest CT recommended for further evaluation. Also noted to have chronic interstitial lung disease. CT Chest showed no pulmonary mass or acute findings to account for the masslike area on radiograph.  He denies any pain at this current time but is concerned that his pain will return if he starts to walk around the room.    Past Medical History:  Diagnosis Date  . Adrenal adenoma, left    small  . Aortic atherosclerosis ([place])   . Back pain   . Bilateral carotid artery stenosis followed by dr Claiborne Billings   per last carotid duplex [id number]  -- proximal LICA 69-48% ,  RICA 5-46%  . CAD (coronary artery disease)    a. s/p prior intervention to RCA and PDA in 1999 b. DES to LAD and distal RCA in 2007  . Cardiomegaly    Stable  .  Chronic constipation   . Chronic pain syndrome   . CKD (chronic kidney disease), stage III ([place])   . DDD (degenerative disc disease), cervical   . DDD (degenerative disc disease), lumbosacral   . Dyspnea   . Dyspnea on exertion   . Gout    per pt last inflared episode 2015 approx.  . Grade II diastolic dysfunction   . H/O epistaxis    per pt sees ENT dr Benjamine Mola for cauterization (pt takes plavix)  . History of acute pancreatitis 08/08/2014  . History of kidney stones   . HTN (hypertension)   . Hyperlipidemia   . Interstitial lung  disease ([place])   . Left arm pain    s/p fall  . Nocturia   . Numbness of right foot    due to DDD lumbar  . Peripheral edema   . Pilonidal cyst   . Pleural effusion on right   . Pulmonary fibrosis ([place])    followed by pcp-- last chest CT 04-08-2016  . S/P drug eluting coronary stent placement 10/11/2005   mLAD and dRCA  . Wears dentures    upper  . Wears glasses     Past Surgical History:  Procedure Laterality Date  . ANTERIOR CERVICAL DECOMP/DISCECTOMY FUSION  12-25-2001     dr Orinda Kenner [facility]   C3 -- C7  . ANTERIOR CERVICAL DECOMP/DISCECTOMY FUSION  12-11-2012   dr Carloyn Manner  [facility])  . CARDIOVASCULAR STRESS TEST  08-23-2012    dr Claiborne Billings   normal lexiscan nuclear study w/ no ischemia/  normal LV function and wall motion, ef 65%  . CARDIOVASCULAR STRESS TEST    . CORONARY ANGIOPLASTY  1999   PTCA to RCA & PDA  . CORONARY ANGIOPLASTY WITH STENT PLACEMENT  10-11-2005  dr Shelva Majestic   PCI and stenting to [place] (Cypher DES x2)  . CYSTOSCOPY W/ URETERAL STENT PLACEMENT Left 02-11-2010     [facility]  . LUMBAR LAMINECTOMY  2015   L5 -- S1  . PILONIDAL CYST EXCISION  1980s  . PILONIDAL CYST EXCISION N/A 03/31/2017   Procedure: EXCISION OF PILONIDAL DISEASE with Flap Rotation;  Surgeon: Leighton Ruff, MD;  Location: [place];  Service: General;  Laterality: N/A;  . THROAT SURGERY  1996   per pt "removal piece of cryloid(?) that was pressing against throat"  states no issues since  . TRANSTHORACIC ECHOCARDIOGRAM  08-24-2010  dr Claiborne Billings   ef 50-55%/  mild MR/  trivial TR  . WOUND DEBRIDEMENT N/A 10/06/2017   Procedure: DEBRIDEMENT WOUND PLACEMENT OF WOUND HEALING MATRIX;  Surgeon: Leighton Ruff, MD;  Location: [place];  Service: General;  Laterality: N/A;     Home Medications:  Prior to Admission medications   Medication Sig Start Date End Date Taking? Authorizing Provider  allopurinol (ZYLOPRIM) 300 MG tablet Take 300 mg by mouth every  morning.  11/06/12  Yes [provider]  amLODipine (NORVASC) 5 MG tablet Take 1 tablet (5 mg total) by mouth daily. 08/03/17 04/11/18 Yes Troy Sine, MD  aspirin EC 81 MG tablet Take 81 mg by mouth daily.   Yes [provider]  cholecalciferol (VITAMIN D) 1000 units tablet Take 1,000 Units by mouth every morning.    Yes [provider]  clopidogrel (PLAVIX) 75 MG tablet TAKE ONE (1) TABLET BY MOUTH EVERY DAY Patient taking differently: Take 75 mg by mouth every other day.  09/16/17  Yes Troy Sine, MD  lisinopril (PRINIVIL,ZESTRIL) 5 MG tablet Take 5 mg by mouth every evening.    Yes [provider]  metoprolol tartrate (LOPRESSOR) 50 MG tablet TAKE ONE TABLET BY MOUTH TWICE A DAY Patient taking differently: Take 50 mg by mouth 2 (two) times daily.  03/16/18  Yes Troy Sine, MD  naproxen sodium (ALEVE) 220 MG tablet Take 220-440 mg by mouth daily as needed (for pain).    Yes [provider]  nitroGLYCERIN (NITROSTAT) 0.4 MG SL tablet Place 1 tablet (0.4 mg total) under the tongue every 5 (five) minutes as needed for chest pain. 08/03/17 04/11/18 Yes Troy Sine, MD  oxyCODONE-acetaminophen (PERCOCET) 10-325 MG per tablet Take 1 tablet by mouth every 8 (eight) hours as needed for pain.  10/11/12  Yes [provider]  simvastatin (ZOCOR) 20 MG tablet TAKE ONE TABLET BY MOUTH AT BEDTIME. Patient taking differently: Take 20 mg by mouth every other day. bedtime 09/26/17  Yes Troy Sine, MD  tiZANidine (ZANAFLEX) 4 MG tablet Take 4 mg by mouth at bedtime.  11/08/12  Yes [provider]  torsemide (DEMADEX) 20 MG tablet TAKE ONE (1) TABLET BY MOUTH EVERY DAY Patient taking differently: Take 20 mg by mouth daily as needed (for fluid).  09/16/17  Yes Troy Sine, MD  vitamin B-12 (CYANOCOBALAMIN) 1000 MCG tablet Take 1,000 mcg by mouth daily.   Yes [provider]    Inpatient Medications: Scheduled Meds: .  amLODipine  5 mg Oral Daily  . aspirin EC  81 mg Oral Daily  . cholecalciferol  1,000 Units Oral q morning - 10a  . clopidogrel  75 mg Oral Daily  . enoxaparin (LOVENOX) injection  40 mg Subcutaneous Q24H  . lisinopril  5 mg Oral QPM  . metoprolol tartrate  50 mg Oral BID  . simvastatin  20 mg Oral q1800  . tiZANidine  4 mg Oral QHS   Continuous Infusions: . sodium chloride 10 mL/hr at 04/12/18 0038   PRN Meds: acetaminophen **OR** acetaminophen, nitroGLYCERIN, oxyCODONE-acetaminophen **AND** oxyCODONE  Allergies:    Allergies  Allergen Reactions  . Ibuprofen Itching    Motrin brand  . Neosporin [Neomycin-Bacitracin Zn-Polymyx] Swelling  . Penicillins Rash     Has patient had a PCN reaction causing immediate rash, facial/tongue/throat swelling, SOB or lightheadedness with hypotension: No Has patient had a PCN reaction causing severe rash involving mucus membranes or skin necrosis: No Has patient had a PCN reaction that required hospitalization: No Has patient had a PCN reaction occurring within the last 10 years: No If all of the above answers are "NO", then may proceed with Cephalosporin use.     Social History:   Social History   Socioeconomic History  . Marital status: Married    Spouse name: Not on file  . Number of children: Not on file  . Years of education: Not on file  . Highest education level: Not on file  Occupational History  . Not on file  Social Needs  . Financial resource strain: Not on file  . Food insecurity:    Worry: Not on file    Inability: Not on file  . Transportation needs:    Medical: Not on file    Non-medical: Not on file  Tobacco Use  . Smoking status: Former Smoker    Packs/day: 3.00    Years: 25.00    Pack years: 75.00    Types: Cigarettes    Last attempt to quit: 11/27/1988  Years since quitting: 29.3  . Smokeless tobacco: Former Systems developer    Quit date: 03/28/1989  Substance and Sexual Activity  . Alcohol use: No  . Drug use: No    . Sexual activity: Not on file  Lifestyle  . Physical activity:    Days per week: Not on file    Minutes per session: Not on file  . Stress: Not on file  Relationships  . Social connections:    Talks on phone: Not on file    Gets together: Not on file    Attends religious service: Not on file    Active member of club or organization: Not on file    Attends meetings of clubs or organizations: Not on file    Relationship status: Not on file  . Intimate partner violence:    Fear of current or ex partner: Not on file    Emotionally abused: Not on file    Physically abused: Not on file    Forced sexual activity: Not on file  Other Topics Concern  . Not on file  Social History Narrative   Previously used used to work on a farm   Currently retired secondary to chronic back pains-   Used to be a Art therapist   Married to his wife and lives in [place]   Father died at age 46 lung cancer   Mother had CAD   Wife had cancer     Family History:    Family History  Problem Relation Age of Onset  . Cancer Father   . Heart attack Brother   . Diabetes Brother   . Colon cancer Neg Hx       Review of Systems    General:  No chills, fever, night sweats or weight changes.  Cardiovascular:  No edema, orthopnea, palpitations, paroxysmal nocturnal dyspnea. Positive for chest pain and dyspnea on exertion.  Dermatological: No rash, lesions/masses Respiratory: No cough, dyspnea Urologic: No hematuria, dysuria Abdominal:   No nausea, vomiting, diarrhea, bright red blood per rectum, melena, or hematemesis Neurologic:  No visual changes, wkns, changes in mental status. All other systems reviewed and are otherwise negative except as noted above.  Physical Exam/Data    Vitals:   04/12/18 0600 04/12/18 0700 04/12/18 0934 04/12/18 0951  BP: (!) 129/56 (!) 133/54 (!) 157/101 (!) 156/75  Pulse: (!) 50  79   Resp: 11 13 18    Temp:   98 F (36.7 C)   TempSrc:    Oral   SpO2: 97% 95% 96%   Weight:   111.1 kg   Height:   6' (1.829 m)     Intake/Output Summary (Last 24 hours) at 04/12/2018 1009 Last data filed at 04/12/2018 0800 Gross per 24 hour  Intake 0 ml  Output -  Net 0 ml   Filed Weights   04/11/18 2111 04/12/18 0934  Weight: 103.4 kg 111.1 kg   Body mass index is 33.22 kg/m.   General: Pleasant, Caucasian male appearing in NAD Psych: Normal affect. Neuro: Alert and oriented X 3. Moves all extremities spontaneously. HEENT: Normal  Neck: Supple without bruits or JVD. Lungs:  Resp regular and unlabored, CTA without wheezing or rales. Heart: RRR no s3, s4, or murmurs. Abdomen: Soft, non-tender, non-distended, BS + x 4.  Extremities: No clubbing or cyanosis. Trace lower extremity edema. DP/PT/Radials 2+ and equal bilaterally.   EKG:  The EKG was personally reviewed and demonstrates: NSR, HR 65, with no acute  ST changes when compared to prior tracings.    Labs/Studies     Relevant CV Studies:  NST: 07/2017  The left ventricular ejection fraction is normal (55-65%).  Nuclear stress EF: 57%.  There was no ST segment deviation noted during stress.  Defect 1: There is a small defect of mild severity present in the apex location.  This is a low risk study.   Low risk, probably normal stress nuclear study with minimal apical thinning; no ischemia; EF 57 with normal wall motion.   Echocardiogram: 07/2017 Study Conclusions  - Left ventricle: The cavity size was normal. There was mild   concentric hypertrophy. Systolic function was normal. The   estimated ejection fraction was in the range of 60% to 65%. Wall   motion was normal; there were no regional wall motion   abnormalities. Features are consistent with a pseudonormal left   ventricular filling pattern, with concomitant abnormal relaxation   and increased filling pressure (grade 2 diastolic dysfunction).   Doppler parameters are consistent with high ventricular  filling   pressure. - Aortic valve: Transvalvular velocity was within the normal range.   There was no stenosis. There was no regurgitation. - Mitral valve: Transvalvular velocity was within the normal range.   There was no evidence for stenosis. There was no regurgitation. - Right ventricle: The cavity size was normal. Wall thickness was   normal. Systolic function was normal. - Atrial septum: No defect or patent foramen ovale was identified. - Tricuspid valve: There was trivial regurgitation. - Pulmonary arteries: Systolic pressure was mildly increased. PA   peak pressure: 46 mm Hg (S).  Cardiac Catheterization: 10/2005 ANGIOGRAPHIC DATA:  The left main coronary artery was angiographically  normal and bifurcated in the LAD and left circumflex system. As was  previously demonstrated on the diagnostic catheterization, the LAD in its  mid section after takeoff of a moderate sized diagonal vessel had tubular  narrowing of 85% and was smooth. There was 50% ostial narrowing of this  diagonal vessel proximal to the LAD stenosis. The distal LAD was a large  caliber. Following successful primary stenting with a 3.5 x 13 mm Cypher  stent postdilated to 3.75 mm, the 85% stenosis was reduced to 0%.   The right coronary artery was moderate size vessel that had 85% eccentric  stenosis just beyond the crux prior to the takeoff of the PDA and  continuation branch of the RCA. There was smooth narrowing of the PDA of 40%  as well as 40-50% narrowing in the continuation branch of the RCA beyond the  PDA takeoff. Following primary stenting of the 85% stenosis with a 3.0 x 13  mm Cypher stent postdilated to 3.25 mm, the 85% stenosis was reduced to 0%.  There is TIMI 3 flow.  There was no evidence for dissection. There was no  change in the previous PDA and distal RCA lesions which were not intervened  upon.   IMPRESSION:  Successful two-vessel stenting of the mid left anterior  descending coronary  artery and right coronary artery, with the mid left  anterior descending coronary artery being reduced from 85% to 0% (3.5 x 13  mm Cypher stent postdilated to 3.75 mm) and the right coronary artery being  reduced from 85% to 0% (3.0 x 13 mm Cypher stent postdilated to 3.25 mm),  done with double-bolus Integrilin/weight adjusted heparinization.    Laboratory Data:  Chemistry Recent Labs  Lab 04/11/18 2113 04/12/18 0623  NA 137 139  K 4.0 3.7  CL 103 106  CO2 25 27  GLUCOSE 94 85  BUN 22 20  CREATININE 1.11 1.06  CALCIUM 9.2 8.9  GFRNONAA >60 >60  GFRAA >60 >60  ANIONGAP 9 6    Recent Labs  Lab 04/12/18 0623  PROT 6.3*  ALBUMIN 3.7  AST 20  ALT 14  ALKPHOS 72  BILITOT 1.3*   Hematology Recent Labs  Lab 04/11/18 2113 04/12/18 0623  WBC 8.1 6.0  RBC 4.99 4.56  HGB 14.4 13.3  HCT 45.1 41.5  MCV 90.4 91.0  MCH 28.9 29.2  MCHC 31.9 32.0  RDW 14.4 14.3  PLT 222 184   Cardiac Enzymes Recent Labs  Lab 04/12/18 0005 04/12/18 0623  TROPONINI 0.06* 0.06*    Recent Labs  Lab 04/11/18 2122  TROPIPOC 0.00    BNP Recent Labs  Lab 04/11/18 2113  BNP 195.0*    DDimer No results for input(s): DDIMER in the last 168 hours.  Radiology/Studies:  Dg Chest 2 View  Result Date: 04/11/2018 CLINICAL DATA:  Chest pain EXAM: CHEST - 2 VIEW COMPARISON:  CT 04/06/2017 FINDINGS: Rounded masslike area noted in the left hilar/AP window region, not definitively seen on prior study. Heart is mildly enlarged. No effusions. Diffuse interstitial prominence throughout the lungs compatible with chronic interstitial lung disease. IMPRESSION: Large masslike area in the left hilar/AP window region. Recommend further evaluation with chest CT with IV contrast. Cardiomegaly. Chronic interstitial lung disease. Electronically Signed   By: Rolm Baptise M.D.   On: 04/11/2018 22:48   Ct Chest W Contrast  Result Date: 04/12/2018 CLINICAL DATA:  Chest pain. Abnormal chest x-ray with left  perihilar opacity. EXAM: CT CHEST WITH CONTRAST TECHNIQUE: Multidetector CT imaging of the chest was performed during intravenous contrast administration. CONTRAST:  69mL OMNIPAQUE IOHEXOL 300 MG/ML  SOLN COMPARISON:  Chest radiograph earlier this day. High-resolution chest CT 04/06/2017 FINDINGS: Cardiovascular: Aortic atherosclerosis without dissection or aneurysm. There are coronary artery calcifications. Multi chamber cardiomegaly. No pericardial effusion. Mediastinum/Nodes: Multifocal prominent and borderline enlarged mediastinal and hilar lymph nodes as before. Prevascular node measures 14 mm, previously 15 mm. Right infrahilar node measures 15 mm, previously 12 mm. Lower right hilar node measures 12 mm, previously not well-defined in the absence of IV contrast. Mediastinal and hilar nodes are grossly stable. Thyroid gland is unremarkable. The esophagus is nondistended. No hilar mass to account for radiographic findings, small left hilar lymph nodes are similar to prior exam. Lungs/Pleura: Again seen interstitial lung disease with peripheral honeycombing, bronchiectasis, septal thickening and ground-glass opacities. Overall findings are not significantly changed from prior high-resolution chest CT. No evidence of pulmonary mass to account for radiographic finding. Tiny bibasilar calcifications suggesting prior granulomatous disease, unchanged. The trachea and mainstem bronchi are patent. The small right pleural effusion on prior exam has resolved. No evidence of superimposed pulmonary edema or acute airspace disease. Upper Abdomen: No acute findings. Unchanged left adrenal adenoma. Gallstone without pericholecystic inflammation. Musculoskeletal: There are no acute or suspicious osseous abnormalities. Chronic degenerative change in the spine. IMPRESSION: 1. No pulmonary mass or acute findings to account for masslike area on radiograph. Radiographic findings may have been secondary to patient rotation. 2.  Chronic interstitial lung disease, not significantly changed from prior exam. Multiple prominent and borderline enlarged mediastinal or hilar lymph nodes are not significantly changed from prior exam, likely reactive in the setting of interstitial lung disease. No definite acute superimposed abnormality. 3. Cardiomegaly. Aortic Atherosclerosis (ICD10-I70.0). Coronary artery calcifications. Electronically  Signed   By: Keith Rake M.D.   On: 04/12/2018 00:06   Dg Shoulder Left  Result Date: 04/11/2018 CLINICAL DATA:  Left chest pain, shoulder pain EXAM: LEFT SHOULDER - 2+ VIEW COMPARISON:  None. FINDINGS: Degenerative changes in the left [facility] and glenohumeral joints with joint space narrowing and spurring. No acute bony abnormality. Specifically, no fracture, subluxation, or dislocation. IMPRESSION: Degenerative changes.  No acute bony abnormality. Electronically Signed   By: Rolm Baptise M.D.   On: 04/11/2018 22:49     Assessment & Plan   1. Chest Pain Concerning for Unstable Angina - He presents with new onset episodes of exertional chest discomfort starting approximately 3 days ago which is now occurring with minimal activity such as walking 20 to 30 feet. He has experienced improvement in his symptoms with SL NTG. Denies any current pain at this time while at rest. - I-STAT troponin negative with cyclic troponin I values being flat at 0.06 thus far. EKG shows NSR, HR 65, with no acute ST changes when compared to prior tracings. - given his concerning symptoms and known CAD, would anticipate starting Heparin and transferring to [facility] for a diagnostic catheterization later today. Will review with Dr. Bronson Ing. He has been NPO since midnight. The patient understands that risks include but are not limited to stroke (1 in 1000), death (1 in 78), kidney failure [usually temporary] (1 in 500), bleeding (1 in 200), allergic reaction [possibly serious] (1 in 200).    2. CAD - s/p prior  intervention to RCA and PDA in 1999, DES to LAD and distal RCA in 2007. Most recent ischemic evaluation was a low-risk NST in 07/2017. - plan for repeat ischemic evaluation as outlined above.  - continue ASA, Plavix, statin, and BB therapy.   3. HTN - BP has been at 109/51 - 157/101 since admission. - He has been continued on PTA Amlodipine 5 mg daily, Lisinopril 5 mg daily, and Lopressor 50 mg twice daily. Can consider further titration of Lisinopril or Amlodipine if additional BP control as needed throughout admission.  4. HLD - followed by PCP. No recent FLP on file. Will recheck. - He remains on Simvastatin 20 mg every other day. Would recommend transitioning to high intensity statin therapy if not at goal of LDL less than 70.  5. Stage 3 CKD - creatinine stable at 1.06 this AM.  6. Left Shoulder Pain - started to occur several months ago following a mechanical fall. Plain film this admission shows degenerative changes with no acute bony abnormalities. I encouraged the patient to follow-up with orthopedics as an outpatient.  7. ILD - followed by Pulmonology as an outpatient.    For questions or updates, please contact [place] Please consult [email or web address] for contact info under Cardiology/STEMI.  Signed, Erma Heritage, PA-C 04/12/2018, 10:09 AM Pager: [phone]  The patient was seen and examined, and I agree with the history, physical exam, assessment and plan as documented above, with modifications as noted below. I have also personally reviewed all relevant documentation, old records, labs, and both radiographic and cardiovascular studies. I have also independently interpreted old and new ECG's.  Briefly, this is a 72 year old gentleman with a history of coronary artery disease and multiple prior interventions as detailed above and a low risk nuclear stress test in February 2019.  This past Saturday there was a fish fry at his house and his he was walking from  his house to the building he experienced significant chest  discomfort.  This recurred on Sunday.  On Monday he went to go fishing and when he was putting the equipment in the boat he experienced significant retrosternal and precordial chest pain.  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  He took one sublingual nitroglycerin and symptoms got better to a mild degree but then recurred yesterday evening.  He has some mild chest discomfort while lying in the bed during my evaluation.  He has had left shoulder pain ever since falling on his shoulder about 6 months ago.  Prior to undergoing previous stents, he denies symptoms of chest pain and shortness of breath.  His brother passed away in his sleep of a heart attack at age 63 and so the patient went to get himself evaluated at age 49 and was found to have obstructive coronary artery disease.  Labs reviewed above with minimal troponin elevation of 0.062.  CBC and renal function are normal.  Lipids will be repeated.  Presentation consistent with unstable angina.  He will require coronary angiography.  We will make arrangements to transfer him to [facility].  The patient is in agreement with this plan.    Risks and benefits of cardiac catheterization have been discussed with the patient.  These include bleeding, infection, kidney damage, stroke, heart attack, death.  The patient understands these risks and is willing to proceed.  I will start IV heparin.  Continue aspirin, Plavix, Lopressor, and simvastatin.  I would recommend switching to high intensity statin therapy with a more potent statin if he is able to tolerate.   Kate Sable, MD, [facility]  04/12/2018 10:23 AM

## 2018-04-12 NOTE — Progress Notes (Signed)
Patient arrived to [facility], with R Radial TR band 14cc in band. Pt c/a/ox4, denies complaints.   Pt's right arm does have ecchymosis at the site/forearm. Per cath-lab RN this was present when she received him in recovery. Pt denies noting the bleeding prior to procedure. Pt does report bruising very easily.

## 2018-04-12 NOTE — Consult Note (Addendum)
Cardiology Consult    Patient ID: Maurice Munoz; [id number]; 28-Nov-1945   Admit date: 04/11/2018 Date of Consult: 04/12/2018  Primary Care Provider: Redmond School, MD Primary Cardiologist: Shelva Majestic, MD   Patient Profile    Maurice Munoz is a 72 y.o. male with past medical history of CAD (s/p prior intervention to RCA and PDA in 1999, DES to LAD and distal RCA in 2007, low-risk NST in 07/2017), ILD, HTN, HLD, and Stage 3 CKD who is being seen today for the evaluation of chest pain at the request of Dr. Darrick Meigs.   History of Present Illness    Maurice Munoz was last examined by Dr. Claiborne Billings in 08/2017 and denied any recurrent chest discomfort since undergoing the stress test the previous month. He was planning to undergo upcoming surgery for a pilonidal cyst and was cleared for surgery and informed to hold Plavix for 5 days prior to the procedure. This was performed in 09/2017 with no immediate complication noted.   He presented to [facility] ED on 04/11/2018 for evaluation of chest discomfort. He reports being in his usual state of health over the past several months but starting approximately 3 days ago, he started to have chest discomfort with minimal activity such as walking 20 to 30 feet. He reports symptoms would occur with walking and then improve as soon as he would sit down. Reports associated dyspnea at that time. He has experienced pain along his left shoulder since a fall several months ago and is unsure if the pain along his left arm has worsened during this timeframe. He continued to have intermittent episodes of chest discomfort yesterday when working on his fishing boat and again symptoms would resolve with rest. He did utilize sublingual nitroglycerin x1 with improvement of his symptoms. Notes that his pain is more severe than when he underwent stress testing in 07/2017. Unsure if symptoms resemble prior angina as he did not have chest pain prior to undergoing stent placement  in 2007.  He denies any recent orthopnea, PND, palpitations, lightheadedness, dizziness, or presyncope. Does have chronic lower extremity edema which is been unchanged by his report. He reports a 40+ pound intentional weight loss over the past several months which he equates to changes in his dietary habits.  Initial labs showed WBC 8.1, Hgb 14.4, platelets 222, Na+ 137, K+ 4.0, and creatinine 1.11. BNP slightly elevated to 195. I-STAT troponin negative with cyclic troponin I values being flat at 0.06. EKG shows NSR, HR 65, with no acute ST changes when compared to prior tracings. CXR showed a large masslike area in the left hilar region with chest CT recommended for further evaluation. Also noted to have chronic interstitial lung disease. CT Chest showed no pulmonary mass or acute findings to account for the masslike area on radiograph.  He denies any pain at this current time but is concerned that his pain will return if he starts to walk around the room.    Past Medical History:  Diagnosis Date  . Adrenal adenoma, left    small  . Aortic atherosclerosis ([place])   . Back pain   . Bilateral carotid artery stenosis followed by dr Claiborne Billings   per last carotid duplex [id number]  -- proximal LICA 30-86% ,  RICA 5-78%  . CAD (coronary artery disease)    a. s/p prior intervention to RCA and PDA in 1999 b. DES to LAD and distal RCA in 2007  . Cardiomegaly    Stable  .  Chronic constipation   . Chronic pain syndrome   . CKD (chronic kidney disease), stage III ([place])   . DDD (degenerative disc disease), cervical   . DDD (degenerative disc disease), lumbosacral   . Dyspnea   . Dyspnea on exertion   . Gout    per pt last inflared episode 2015 approx.  . Grade II diastolic dysfunction   . H/O epistaxis    per pt sees ENT dr Benjamine Mola for cauterization (pt takes plavix)  . History of acute pancreatitis 08/08/2014  . History of kidney stones   . HTN (hypertension)   . Hyperlipidemia   . Interstitial lung  disease ([place])   . Left arm pain    s/p fall  . Nocturia   . Numbness of right foot    due to DDD lumbar  . Peripheral edema   . Pilonidal cyst   . Pleural effusion on right   . Pulmonary fibrosis ([place])    followed by pcp-- last chest CT 04-08-2016  . S/P drug eluting coronary stent placement 10/11/2005   mLAD and dRCA  . Wears dentures    upper  . Wears glasses     Past Surgical History:  Procedure Laterality Date  . ANTERIOR CERVICAL DECOMP/DISCECTOMY FUSION  12-25-2001     dr Orinda Kenner [facility]   C3 -- C7  . ANTERIOR CERVICAL DECOMP/DISCECTOMY FUSION  12-11-2012   dr Carloyn Manner  [facility])  . CARDIOVASCULAR STRESS TEST  08-23-2012    dr Claiborne Billings   normal lexiscan nuclear study w/ no ischemia/  normal LV function and wall motion, ef 65%  . CARDIOVASCULAR STRESS TEST    . CORONARY ANGIOPLASTY  1999   PTCA to RCA & PDA  . CORONARY ANGIOPLASTY WITH STENT PLACEMENT  10-11-2005  dr Shelva Majestic   PCI and stenting to [place] (Cypher DES x2)  . CYSTOSCOPY W/ URETERAL STENT PLACEMENT Left 02-11-2010     [facility]  . LUMBAR LAMINECTOMY  2015   L5 -- S1  . PILONIDAL CYST EXCISION  1980s  . PILONIDAL CYST EXCISION N/A 03/31/2017   Procedure: EXCISION OF PILONIDAL DISEASE with Flap Rotation;  Surgeon: Leighton Ruff, MD;  Location: [facility];  Service: General;  Laterality: N/A;  . THROAT SURGERY  1996   per pt "removal piece of cryloid(?) that was pressing against throat"  states no issues since  . TRANSTHORACIC ECHOCARDIOGRAM  08-24-2010  dr Claiborne Billings   ef 50-55%/  mild MR/  trivial TR  . WOUND DEBRIDEMENT N/A 10/06/2017   Procedure: DEBRIDEMENT WOUND PLACEMENT OF WOUND HEALING MATRIX;  Surgeon: Leighton Ruff, MD;  Location: [facility];  Service: General;  Laterality: N/A;     Home Medications:  Prior to Admission medications   Medication Sig Start Date End Date Taking? Authorizing Provider  allopurinol (ZYLOPRIM) 300 MG tablet Take 300 mg by mouth every  morning.  11/06/12  Yes [provider]  amLODipine (NORVASC) 5 MG tablet Take 1 tablet (5 mg total) by mouth daily. 08/03/17 04/11/18 Yes Troy Sine, MD  aspirin EC 81 MG tablet Take 81 mg by mouth daily.   Yes [provider]  cholecalciferol (VITAMIN D) 1000 units tablet Take 1,000 Units by mouth every morning.    Yes [provider]  clopidogrel (PLAVIX) 75 MG tablet TAKE ONE (1) TABLET BY MOUTH EVERY DAY Patient taking differently: Take 75 mg by mouth every other day.  09/16/17  Yes Troy Sine, MD  lisinopril (PRINIVIL,ZESTRIL) 5 MG tablet Take 5 mg by mouth every evening.    Yes [provider]  metoprolol tartrate (LOPRESSOR) 50 MG tablet TAKE ONE TABLET BY MOUTH TWICE A DAY Patient taking differently: Take 50 mg by mouth 2 (two) times daily.  03/16/18  Yes Troy Sine, MD  naproxen sodium (ALEVE) 220 MG tablet Take 220-440 mg by mouth daily as needed (for pain).    Yes [provider]  nitroGLYCERIN (NITROSTAT) 0.4 MG SL tablet Place 1 tablet (0.4 mg total) under the tongue every 5 (five) minutes as needed for chest pain. 08/03/17 04/11/18 Yes Troy Sine, MD  oxyCODONE-acetaminophen (PERCOCET) 10-325 MG per tablet Take 1 tablet by mouth every 8 (eight) hours as needed for pain.  10/11/12  Yes [provider]  simvastatin (ZOCOR) 20 MG tablet TAKE ONE TABLET BY MOUTH AT BEDTIME. Patient taking differently: Take 20 mg by mouth every other day. bedtime 09/26/17  Yes Troy Sine, MD  tiZANidine (ZANAFLEX) 4 MG tablet Take 4 mg by mouth at bedtime.  11/08/12  Yes [provider]  torsemide (DEMADEX) 20 MG tablet TAKE ONE (1) TABLET BY MOUTH EVERY DAY Patient taking differently: Take 20 mg by mouth daily as needed (for fluid).  09/16/17  Yes Troy Sine, MD  vitamin B-12 (CYANOCOBALAMIN) 1000 MCG tablet Take 1,000 mcg by mouth daily.   Yes [provider]    Inpatient Medications: Scheduled Meds: .  amLODipine  5 mg Oral Daily  . aspirin EC  81 mg Oral Daily  . cholecalciferol  1,000 Units Oral q morning - 10a  . clopidogrel  75 mg Oral Daily  . enoxaparin (LOVENOX) injection  40 mg Subcutaneous Q24H  . lisinopril  5 mg Oral QPM  . metoprolol tartrate  50 mg Oral BID  . simvastatin  20 mg Oral q1800  . tiZANidine  4 mg Oral QHS   Continuous Infusions: . sodium chloride 10 mL/hr at 04/12/18 0038   PRN Meds: acetaminophen **OR** acetaminophen, nitroGLYCERIN, oxyCODONE-acetaminophen **AND** oxyCODONE  Allergies:    Allergies  Allergen Reactions  . Ibuprofen Itching    Motrin brand  . Neosporin [Neomycin-Bacitracin Zn-Polymyx] Swelling  . Penicillins Rash     Has patient had a PCN reaction causing immediate rash, facial/tongue/throat swelling, SOB or lightheadedness with hypotension: No Has patient had a PCN reaction causing severe rash involving mucus membranes or skin necrosis: No Has patient had a PCN reaction that required hospitalization: No Has patient had a PCN reaction occurring within the last 10 years: No If all of the above answers are "NO", then may proceed with Cephalosporin use.     Social History:   Social History   Socioeconomic History  . Marital status: Married    Spouse name: Not on file  . Number of children: Not on file  . Years of education: Not on file  . Highest education level: Not on file  Occupational History  . Not on file  Social Needs  . Financial resource strain: Not on file  . Food insecurity:    Worry: Not on file    Inability: Not on file  . Transportation needs:    Medical: Not on file    Non-medical: Not on file  Tobacco Use  . Smoking status: Former Smoker    Packs/day: 3.00    Years: 25.00    Pack years: 75.00    Types: Cigarettes    Last attempt to quit: 11/27/1988  Years since quitting: 29.3  . Smokeless tobacco: Former Systems developer    Quit date: 03/28/1989  Substance and Sexual Activity  . Alcohol use: No  . Drug use: No    . Sexual activity: Not on file  Lifestyle  . Physical activity:    Days per week: Not on file    Minutes per session: Not on file  . Stress: Not on file  Relationships  . Social connections:    Talks on phone: Not on file    Gets together: Not on file    Attends religious service: Not on file    Active member of club or organization: Not on file    Attends meetings of clubs or organizations: Not on file    Relationship status: Not on file  . Intimate partner violence:    Fear of current or ex partner: Not on file    Emotionally abused: Not on file    Physically abused: Not on file    Forced sexual activity: Not on file  Other Topics Concern  . Not on file  Social History Narrative   Previously used used to work on a farm   Currently retired secondary to chronic back pains-   Used to be a Art therapist   Married to his wife and lives in [place]   Father died at age 63 lung cancer   Mother had CAD   Wife had cancer     Family History:    Family History  Problem Relation Age of Onset  . Cancer Father   . Heart attack Brother   . Diabetes Brother   . Colon cancer Neg Hx       Review of Systems    General:  No chills, fever, night sweats or weight changes.  Cardiovascular:  No edema, orthopnea, palpitations, paroxysmal nocturnal dyspnea. Positive for chest pain and dyspnea on exertion.  Dermatological: No rash, lesions/masses Respiratory: No cough, dyspnea Urologic: No hematuria, dysuria Abdominal:   No nausea, vomiting, diarrhea, bright red blood per rectum, melena, or hematemesis Neurologic:  No visual changes, wkns, changes in mental status. All other systems reviewed and are otherwise negative except as noted above.  Physical Exam/Data    Vitals:   04/12/18 0600 04/12/18 0700 04/12/18 0934 04/12/18 0951  BP: (!) 129/56 (!) 133/54 (!) 157/101 (!) 156/75  Pulse: (!) 50  79   Resp: 11 13 18    Temp:   98 F (36.7 C)   TempSrc:    Oral   SpO2: 97% 95% 96%   Weight:   111.1 kg   Height:   6' (1.829 m)     Intake/Output Summary (Last 24 hours) at 04/12/2018 1009 Last data filed at 04/12/2018 0800 Gross per 24 hour  Intake 0 ml  Output -  Net 0 ml   Filed Weights   04/11/18 2111 04/12/18 0934  Weight: 103.4 kg 111.1 kg   Body mass index is 33.22 kg/m.   General: Pleasant, Caucasian male appearing in NAD Psych: Normal affect. Neuro: Alert and oriented X 3. Moves all extremities spontaneously. HEENT: Normal  Neck: Supple without bruits or JVD. Lungs:  Resp regular and unlabored, CTA without wheezing or rales. Heart: RRR no s3, s4, or murmurs. Abdomen: Soft, non-tender, non-distended, BS + x 4.  Extremities: No clubbing or cyanosis. Trace lower extremity edema. DP/PT/Radials 2+ and equal bilaterally.   EKG:  The EKG was personally reviewed and demonstrates: NSR, HR 65, with no acute  ST changes when compared to prior tracings.    Labs/Studies     Relevant CV Studies:  NST: 07/2017  The left ventricular ejection fraction is normal (55-65%).  Nuclear stress EF: 57%.  There was no ST segment deviation noted during stress.  Defect 1: There is a small defect of mild severity present in the apex location.  This is a low risk study.   Low risk, probably normal stress nuclear study with minimal apical thinning; no ischemia; EF 57 with normal wall motion.   Echocardiogram: 07/2017 Study Conclusions  - Left ventricle: The cavity size was normal. There was mild   concentric hypertrophy. Systolic function was normal. The   estimated ejection fraction was in the range of 60% to 65%. Wall   motion was normal; there were no regional wall motion   abnormalities. Features are consistent with a pseudonormal left   ventricular filling pattern, with concomitant abnormal relaxation   and increased filling pressure (grade 2 diastolic dysfunction).   Doppler parameters are consistent with high ventricular  filling   pressure. - Aortic valve: Transvalvular velocity was within the normal range.   There was no stenosis. There was no regurgitation. - Mitral valve: Transvalvular velocity was within the normal range.   There was no evidence for stenosis. There was no regurgitation. - Right ventricle: The cavity size was normal. Wall thickness was   normal. Systolic function was normal. - Atrial septum: No defect or patent foramen ovale was identified. - Tricuspid valve: There was trivial regurgitation. - Pulmonary arteries: Systolic pressure was mildly increased. PA   peak pressure: 46 mm Hg (S).  Cardiac Catheterization: 10/2005 ANGIOGRAPHIC DATA:  The left main coronary artery was angiographically  normal and bifurcated in the LAD and left circumflex system. As was  previously demonstrated on the diagnostic catheterization, the LAD in its  mid section after takeoff of a moderate sized diagonal vessel had tubular  narrowing of 85% and was smooth. There was 50% ostial narrowing of this  diagonal vessel proximal to the LAD stenosis. The distal LAD was a large  caliber. Following successful primary stenting with a 3.5 x 13 mm Cypher  stent postdilated to 3.75 mm, the 85% stenosis was reduced to 0%.   The right coronary artery was moderate size vessel that had 85% eccentric  stenosis just beyond the crux prior to the takeoff of the PDA and  continuation branch of the RCA. There was smooth narrowing of the PDA of 40%  as well as 40-50% narrowing in the continuation branch of the RCA beyond the  PDA takeoff. Following primary stenting of the 85% stenosis with a 3.0 x 13  mm Cypher stent postdilated to 3.25 mm, the 85% stenosis was reduced to 0%.  There is TIMI 3 flow.  There was no evidence for dissection. There was no  change in the previous PDA and distal RCA lesions which were not intervened  upon.   IMPRESSION:  Successful two-vessel stenting of the mid left anterior  descending coronary  artery and right coronary artery, with the mid left  anterior descending coronary artery being reduced from 85% to 0% (3.5 x 13  mm Cypher stent postdilated to 3.75 mm) and the right coronary artery being  reduced from 85% to 0% (3.0 x 13 mm Cypher stent postdilated to 3.25 mm),  done with double-bolus Integrilin/weight adjusted heparinization.    Laboratory Data:  Chemistry Recent Labs  Lab 04/11/18 2113 04/12/18 0623  NA 137 139  K 4.0 3.7  CL 103 106  CO2 25 27  GLUCOSE 94 85  BUN 22 20  CREATININE 1.11 1.06  CALCIUM 9.2 8.9  GFRNONAA >60 >60  GFRAA >60 >60  ANIONGAP 9 6    Recent Labs  Lab 04/12/18 0623  PROT 6.3*  ALBUMIN 3.7  AST 20  ALT 14  ALKPHOS 72  BILITOT 1.3*   Hematology Recent Labs  Lab 04/11/18 2113 04/12/18 0623  WBC 8.1 6.0  RBC 4.99 4.56  HGB 14.4 13.3  HCT 45.1 41.5  MCV 90.4 91.0  MCH 28.9 29.2  MCHC 31.9 32.0  RDW 14.4 14.3  PLT 222 184   Cardiac Enzymes Recent Labs  Lab 04/12/18 0005 04/12/18 0623  TROPONINI 0.06* 0.06*    Recent Labs  Lab 04/11/18 2122  TROPIPOC 0.00    BNP Recent Labs  Lab 04/11/18 2113  BNP 195.0*    DDimer No results for input(s): DDIMER in the last 168 hours.  Radiology/Studies:  Dg Chest 2 View  Result Date: 04/11/2018 CLINICAL DATA:  Chest pain EXAM: CHEST - 2 VIEW COMPARISON:  CT 04/06/2017 FINDINGS: Rounded masslike area noted in the left hilar/AP window region, not definitively seen on prior study. Heart is mildly enlarged. No effusions. Diffuse interstitial prominence throughout the lungs compatible with chronic interstitial lung disease. IMPRESSION: Large masslike area in the left hilar/AP window region. Recommend further evaluation with chest CT with IV contrast. Cardiomegaly. Chronic interstitial lung disease. Electronically Signed   By: Rolm Baptise M.D.   On: 04/11/2018 22:48   Ct Chest W Contrast  Result Date: 04/12/2018 CLINICAL DATA:  Chest pain. Abnormal chest x-ray with left  perihilar opacity. EXAM: CT CHEST WITH CONTRAST TECHNIQUE: Multidetector CT imaging of the chest was performed during intravenous contrast administration. CONTRAST:  58mL OMNIPAQUE IOHEXOL 300 MG/ML  SOLN COMPARISON:  Chest radiograph earlier this day. High-resolution chest CT 04/06/2017 FINDINGS: Cardiovascular: Aortic atherosclerosis without dissection or aneurysm. There are coronary artery calcifications. Multi chamber cardiomegaly. No pericardial effusion. Mediastinum/Nodes: Multifocal prominent and borderline enlarged mediastinal and hilar lymph nodes as before. Prevascular node measures 14 mm, previously 15 mm. Right infrahilar node measures 15 mm, previously 12 mm. Lower right hilar node measures 12 mm, previously not well-defined in the absence of IV contrast. Mediastinal and hilar nodes are grossly stable. Thyroid gland is unremarkable. The esophagus is nondistended. No hilar mass to account for radiographic findings, small left hilar lymph nodes are similar to prior exam. Lungs/Pleura: Again seen interstitial lung disease with peripheral honeycombing, bronchiectasis, septal thickening and ground-glass opacities. Overall findings are not significantly changed from prior high-resolution chest CT. No evidence of pulmonary mass to account for radiographic finding. Tiny bibasilar calcifications suggesting prior granulomatous disease, unchanged. The trachea and mainstem bronchi are patent. The small right pleural effusion on prior exam has resolved. No evidence of superimposed pulmonary edema or acute airspace disease. Upper Abdomen: No acute findings. Unchanged left adrenal adenoma. Gallstone without pericholecystic inflammation. Musculoskeletal: There are no acute or suspicious osseous abnormalities. Chronic degenerative change in the spine. IMPRESSION: 1. No pulmonary mass or acute findings to account for masslike area on radiograph. Radiographic findings may have been secondary to patient rotation. 2.  Chronic interstitial lung disease, not significantly changed from prior exam. Multiple prominent and borderline enlarged mediastinal or hilar lymph nodes are not significantly changed from prior exam, likely reactive in the setting of interstitial lung disease. No definite acute superimposed abnormality. 3. Cardiomegaly. Aortic Atherosclerosis (ICD10-I70.0). Coronary artery calcifications. Electronically  Signed   By: Keith Rake M.D.   On: 04/12/2018 00:06   Dg Shoulder Left  Result Date: 04/11/2018 CLINICAL DATA:  Left chest pain, shoulder pain EXAM: LEFT SHOULDER - 2+ VIEW COMPARISON:  None. FINDINGS: Degenerative changes in the left [facility] and glenohumeral joints with joint space narrowing and spurring. No acute bony abnormality. Specifically, no fracture, subluxation, or dislocation. IMPRESSION: Degenerative changes.  No acute bony abnormality. Electronically Signed   By: Rolm Baptise M.D.   On: 04/11/2018 22:49     Assessment & Plan   1. Chest Pain Concerning for Unstable Angina - He presents with new onset episodes of exertional chest discomfort starting approximately 3 days ago which is now occurring with minimal activity such as walking 20 to 30 feet. He has experienced improvement in his symptoms with SL NTG. Denies any current pain at this time while at rest. - I-STAT troponin negative with cyclic troponin I values being flat at 0.06 thus far. EKG shows NSR, HR 65, with no acute ST changes when compared to prior tracings. - given his concerning symptoms and known CAD, would anticipate starting Heparin and transferring to [facility] for a diagnostic catheterization later today. Will review with Dr. Bronson Ing. He has been NPO since midnight. The patient understands that risks include but are not limited to stroke (1 in 1000), death (1 in 42), kidney failure [usually temporary] (1 in 500), bleeding (1 in 200), allergic reaction [possibly serious] (1 in 200).    2. CAD - s/p prior  intervention to RCA and PDA in 1999, DES to LAD and distal RCA in 2007. Most recent ischemic evaluation was a low-risk NST in 07/2017. - plan for repeat ischemic evaluation as outlined above.  - continue ASA, Plavix, statin, and BB therapy.   3. HTN - BP has been at 109/51 - 157/101 since admission. - He has been continued on PTA Amlodipine 5 mg daily, Lisinopril 5 mg daily, and Lopressor 50 mg twice daily. Can consider further titration of Lisinopril or Amlodipine if additional BP control as needed throughout admission.  4. HLD - followed by PCP. No recent FLP on file. Will recheck. - He remains on Simvastatin 20 mg every other day. Would recommend transitioning to high intensity statin therapy if not at goal of LDL less than 70.  5. Stage 3 CKD - creatinine stable at 1.06 this AM.  6. Left Shoulder Pain - started to occur several months ago following a mechanical fall. Plain film this admission shows degenerative changes with no acute bony abnormalities. I encouraged the patient to follow-up with orthopedics as an outpatient.  7. ILD - followed by Pulmonology as an outpatient.    For questions or updates, please contact [place] Please consult [email or web address] for contact info under Cardiology/STEMI.  Signed, Erma Heritage, PA-C 04/12/2018, 10:09 AM Pager: [phone]  The patient was seen and examined, and I agree with the history, physical exam, assessment and plan as documented above, with modifications as noted below. I have also personally reviewed all relevant documentation, old records, labs, and both radiographic and cardiovascular studies. I have also independently interpreted old and new ECG's.  Briefly, this is a 72 year old gentleman with a history of coronary artery disease and multiple prior interventions as detailed above and a low risk nuclear stress test in February 2019.  This past Saturday there was a fish fry at his house and his he was walking from  his house to the building he experienced significant chest  discomfort.  This recurred on Sunday.  On Monday he went to go fishing and when he was putting the equipment in the boat he experienced significant retrosternal and precordial chest pain.  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  He took one sublingual nitroglycerin and symptoms got better to a mild degree but then recurred yesterday evening.  He has some mild chest discomfort while lying in the bed during my evaluation.  He has had left shoulder pain ever since falling on his shoulder about 6 months ago.  Prior to undergoing previous stents, he denies symptoms of chest pain and shortness of breath.  His brother passed away in his sleep of a heart attack at age 63 and so the patient went to get himself evaluated at age 66 and was found to have obstructive coronary artery disease.  Labs reviewed above with minimal troponin elevation of 0.062.  CBC and renal function are normal.  Lipids will be repeated.  Presentation consistent with unstable angina.  He will require coronary angiography.  We will make arrangements to transfer him to [facility].  The patient is in agreement with this plan.    Risks and benefits of cardiac catheterization have been discussed with the patient.  These include bleeding, infection, kidney damage, stroke, heart attack, death.  The patient understands these risks and is willing to proceed.  I will start IV heparin.  Continue aspirin, Plavix, Lopressor, and simvastatin.  I would recommend switching to high intensity statin therapy with a more potent statin if he is able to tolerate.   Kate Sable, MD, [facility]  04/12/2018 10:23 AM

## 2018-04-12 NOTE — Progress Notes (Signed)
PROGRESS NOTE  Maurice Munoz JJO:[id number] DOB: 10-17-1945 DOA: 04/11/2018 PCP: Redmond School, MD  Brief History:  72 year old male with a history of coronary artery disease status post DES to the LAD and right RCA April 2007, CKD stage III, hypertension, interstitial lung disease, pulmonary fibrosis presenting with 3-day history of exertional chest discomfort.  The patient stated that he had chest discomfort with doing household chores while walking in and out of his backyard.  His chest discomfort was substernal moderate in nature described as burning and also sharp in pain.  It is improved with rest.  He denies any fevers, chills, coughing, hemoptysis, nausea, vomiting, diarrhea, abdominal pain.  He endorses compliance with all his medications including aspirin and Plavix.  Troponins were 0.062.  Cardiology was consulted to assist with management.    Assessment/Plan: Exertional chest chest pain -Cardiology consult -08/12/2017 Myoview low risk, 57% -Echocardiogram -Troponin 0 0.062 -Personally reviewed EKG--sinus rhythm, no ST-T wave changes  Interstitial lung disease/hilar lymphadenopathy -Personally reviewed chest x-ray--left hilar density -04/11/2018 CT chest--grossly stable hilar lymphadenopathy and mediastinal lymphadenopathy; unchanged/stable ILD changes with honeycombing and bronchiectasis; no pulmonary mass -Follow-up pulmonary medicine, [name]  Essential hypertension -Controlled -Continue amlodipine, lisinopril, metoprolol tartrate  Hyperlipidemia -Continue statin  Coronary artery disease -Continue aspirin, Plavix -Continue statin  CKD stage III -Baseline creatinine 1.0-1.4      Disposition Plan:   Home when cleared by cardiology Family Communication:  No Family at bedside  Consultants:  cardiology  Code Status:  FULL   DVT Prophylaxis:   Lovenox   Procedures: As Listed in Progress Note  Above  Antibiotics: None    Subjective: Patient denies fevers, chills, headache, chest pain, dyspnea, nausea, vomiting, diarrhea, abdominal pain, dysuria, hematuria, hematochezia, and melena.   Objective: Vitals:   04/12/18 0400 04/12/18 0500 04/12/18 0600 04/12/18 0700  BP: (!) 117/51 (!) 119/57 (!) 129/56 (!) 133/54  Pulse: (!) 52 (!) 55 (!) 50   Resp: 11 10 11 13   Temp:      TempSrc:      SpO2: 95% 90% 97% 95%  Weight:      Height:       No intake or output data in the 24 hours ending 04/12/18 0714 Weight change:  Exam:   General:  Pt is alert, follows commands appropriately, not in acute distress  HEENT: No icterus, No thrush, No neck mass, [place]/AT  Cardiovascular: RRR, S1/S2, no rubs, no gallops  Respiratory: Bibasilar crackles.  No wheeze.  Diminished breath sounds bilateral.  Abdomen: Soft/+BS, non tender, non distended, no guarding  Extremities: No edema, No lymphangitis, No petechiae, No rashes, no synovitis   Data Reviewed: I have personally reviewed following labs and imaging studies Basic Metabolic Panel: Recent Labs  Lab 04/11/18 2113 04/12/18 0623  NA 137 139  K 4.0 3.7  CL 103 106  CO2 25 27  GLUCOSE 94 85  BUN 22 20  CREATININE 1.11 1.06  CALCIUM 9.2 8.9   Liver Function Tests: Recent Labs  Lab 04/12/18 0623  AST 20  ALT 14  ALKPHOS 72  BILITOT 1.3*  PROT 6.3*  ALBUMIN 3.7   No results for input(s): LIPASE, AMYLASE in the last 168 hours. No results for input(s): AMMONIA in the last 168 hours. Coagulation Profile: No results for input(s): INR, PROTIME in the last 168 hours. CBC: Recent Labs  Lab 04/11/18 2113 04/12/18 0623  WBC 8.1 6.0  HGB 14.4 13.3  HCT 45.1 41.5  MCV 90.4 91.0  PLT 222 184   Cardiac Enzymes: Recent Labs  Lab 04/12/18 0005 04/12/18 0623  TROPONINI 0.06* 0.06*   BNP: Invalid input(s): POCBNP CBG: No results for input(s): GLUCAP in the last 168 hours. HbA1C: No results for input(s): HGBA1C in  the last 72 hours. Urine analysis:    Component Value Date/Time   COLORURINE YELLOW 03/08/2010 1403   APPEARANCEUR CLOUDY (A) 03/08/2010 1403   LABSPEC 1.012 03/08/2010 1403   PHURINE 5.5 03/08/2010 1403   GLUCOSEU NEGATIVE 03/08/2010 1403   HGBUR TRACE (A) 03/08/2010 1403   BILIRUBINUR NEGATIVE 03/08/2010 1403   KETONESUR NEGATIVE 03/08/2010 1403   PROTEINUR 100 (A) 03/08/2010 1403   UROBILINOGEN 0.2 03/08/2010 1403   NITRITE NEGATIVE 03/08/2010 1403   LEUKOCYTESUR NEGATIVE 03/08/2010 1403   Sepsis Labs: @LABRCNTIP (procalcitonin:4,lacticidven:4) )No results found for this or any previous visit (from the past 240 hour(s)).   Scheduled Meds: . amLODipine  5 mg Oral Daily  . aspirin EC  81 mg Oral Daily  . cholecalciferol  1,000 Units Oral q morning - 10a  . clopidogrel  75 mg Oral Daily  . enoxaparin (LOVENOX) injection  40 mg Subcutaneous Q24H  . lisinopril  5 mg Oral QPM  . metoprolol tartrate  50 mg Oral BID  . simvastatin  20 mg Oral q1800  . tiZANidine  4 mg Oral QHS   Continuous Infusions: . sodium chloride 10 mL/hr at 04/12/18 0038    Procedures/Studies: Dg Chest 2 View  Result Date: 04/11/2018 CLINICAL DATA:  Chest pain EXAM: CHEST - 2 VIEW COMPARISON:  CT 04/06/2017 FINDINGS: Rounded masslike area noted in the left hilar/AP window region, not definitively seen on prior study. Heart is mildly enlarged. No effusions. Diffuse interstitial prominence throughout the lungs compatible with chronic interstitial lung disease. IMPRESSION: Large masslike area in the left hilar/AP window region. Recommend further evaluation with chest CT with IV contrast. Cardiomegaly. Chronic interstitial lung disease. Electronically Signed   By: Rolm Baptise M.D.   On: 04/11/2018 22:48   Ct Chest W Contrast  Result Date: 04/12/2018 CLINICAL DATA:  Chest pain. Abnormal chest x-ray with left perihilar opacity. EXAM: CT CHEST WITH CONTRAST TECHNIQUE: Multidetector CT imaging of the chest was  performed during intravenous contrast administration. CONTRAST:  33mL OMNIPAQUE IOHEXOL 300 MG/ML  SOLN COMPARISON:  Chest radiograph earlier this day. High-resolution chest CT 04/06/2017 FINDINGS: Cardiovascular: Aortic atherosclerosis without dissection or aneurysm. There are coronary artery calcifications. Multi chamber cardiomegaly. No pericardial effusion. Mediastinum/Nodes: Multifocal prominent and borderline enlarged mediastinal and hilar lymph nodes as before. Prevascular node measures 14 mm, previously 15 mm. Right infrahilar node measures 15 mm, previously 12 mm. Lower right hilar node measures 12 mm, previously not well-defined in the absence of IV contrast. Mediastinal and hilar nodes are grossly stable. Thyroid gland is unremarkable. The esophagus is nondistended. No hilar mass to account for radiographic findings, small left hilar lymph nodes are similar to prior exam. Lungs/Pleura: Again seen interstitial lung disease with peripheral honeycombing, bronchiectasis, septal thickening and ground-glass opacities. Overall findings are not significantly changed from prior high-resolution chest CT. No evidence of pulmonary mass to account for radiographic finding. Tiny bibasilar calcifications suggesting prior granulomatous disease, unchanged. The trachea and mainstem bronchi are patent. The small right pleural effusion on prior exam has resolved. No evidence of superimposed pulmonary edema or acute airspace disease. Upper Abdomen: No acute findings. Unchanged left adrenal adenoma. Gallstone without pericholecystic inflammation. Musculoskeletal: There are no acute or  suspicious osseous abnormalities. Chronic degenerative change in the spine. IMPRESSION: 1. No pulmonary mass or acute findings to account for masslike area on radiograph. Radiographic findings may have been secondary to patient rotation. 2. Chronic interstitial lung disease, not significantly changed from prior exam. Multiple prominent and  borderline enlarged mediastinal or hilar lymph nodes are not significantly changed from prior exam, likely reactive in the setting of interstitial lung disease. No definite acute superimposed abnormality. 3. Cardiomegaly. Aortic Atherosclerosis (ICD10-I70.0). Coronary artery calcifications. Electronically Signed   By: Keith Rake M.D.   On: 04/12/2018 00:06   Dg Shoulder Left  Result Date: 04/11/2018 CLINICAL DATA:  Left chest pain, shoulder pain EXAM: LEFT SHOULDER - 2+ VIEW COMPARISON:  None. FINDINGS: Degenerative changes in the left [facility] and glenohumeral joints with joint space narrowing and spurring. No acute bony abnormality. Specifically, no fracture, subluxation, or dislocation. IMPRESSION: Degenerative changes.  No acute bony abnormality. Electronically Signed   By: Rolm Baptise M.D.   On: 04/11/2018 22:49    Orson Eva, DO  [facility] Pager [phone]  If 7PM-7AM, please contact night-coverage [email or web address] Password [email or web address] 04/12/2018, 7:14 AM   LOS: 0 days

## 2018-04-12 NOTE — Plan of Care (Signed)

## 2018-04-12 NOTE — Progress Notes (Signed)
Maurice Munoz for heparin Indication: chest pain/ACS  Allergies  Allergen Reactions  . Ibuprofen Itching    Motrin brand  . Neosporin [Neomycin-Bacitracin Zn-Polymyx] Swelling  . Penicillins Rash     Has patient had a PCN reaction causing immediate rash, facial/tongue/throat swelling, SOB or lightheadedness with hypotension: No Has patient had a PCN reaction causing severe rash involving mucus membranes or skin necrosis: No Has patient had a PCN reaction that required hospitalization: No Has patient had a PCN reaction occurring within the last 10 years: No If all of the above answers are "NO", then may proceed with Cephalosporin use.     Patient Measurements: Height: 6' (182.9 cm) Weight: 244 lb 14.9 oz (111.1 kg) IBW/kg (Calculated) : 77.6 Heparin Dosing Weight: 101 kg  Vital Signs: Temp: 98 F (36.7 C) (10/23 1254) Temp Source: Oral (10/23 1254) BP: 131/55 (10/23 1640) Pulse Rate: 51 (10/23 1640)  Labs: Recent Labs    04/11/18 2113 04/12/18 0005 04/12/18 0623 04/12/18 1247  HGB 14.4  --  13.3  --   HCT 45.1  --  41.5  --   PLT 222  --  184  --   CREATININE 1.11  --  1.06  --   TROPONINI  --  0.06* 0.06* 0.04*    Estimated Creatinine Clearance: 83.5 mL/min (by C-G formula based on SCr of 1.06 mg/dL).   Medical History: Past Medical History:  Diagnosis Date  . Adrenal adenoma, left    small  . Aortic atherosclerosis ([place])   . Back pain   . Bilateral carotid artery stenosis followed by dr Claiborne Billings   per last carotid duplex [phone]  -- proximal LICA 86-76% ,  RICA 1-95%  . CAD (coronary artery disease)    a. s/p prior intervention to RCA and PDA in 1999 b. DES to LAD and distal RCA in 2007  . Cardiomegaly    Stable  . Chronic constipation   . Chronic pain syndrome   . CKD (chronic kidney disease), stage III ([place])   . DDD (degenerative disc disease), cervical   . DDD (degenerative disc disease), lumbosacral   . Dyspnea    . Dyspnea on exertion   . Gout    per pt last inflared episode 2015 approx.  . Grade II diastolic dysfunction   . H/O epistaxis    per pt sees ENT dr Benjamine Mola for cauterization (pt takes plavix)  . History of acute pancreatitis 08/08/2014  . History of kidney stones   . HTN (hypertension)   . Hyperlipidemia   . Interstitial lung disease ([place])   . Left arm pain    s/p fall  . Nocturia   . Numbness of right foot    due to DDD lumbar  . Peripheral edema   . Pilonidal cyst   . Pleural effusion on right   . Pulmonary fibrosis ([place])    followed by pcp-- last chest CT 04-08-2016  . S/P drug eluting coronary stent placement 10/11/2005   mLAD and dRCA  . Wears dentures    upper  . Wears glasses     Medications:  Medications Prior to Admission  Medication Sig Dispense Refill Last Dose  . allopurinol (ZYLOPRIM) 300 MG tablet Take 300 mg by mouth every morning.    04/11/2018 at Unknown time  . amLODipine (NORVASC) 5 MG tablet Take 1 tablet (5 mg total) by mouth daily. 90 tablet 3 04/11/2018 at Unknown time  . aspirin EC 81 MG tablet Take  81 mg by mouth daily.   04/11/2018 at Unknown time  . cholecalciferol (VITAMIN D) 1000 units tablet Take 1,000 Units by mouth every morning.    04/11/2018 at Unknown time  . clopidogrel (PLAVIX) 75 MG tablet TAKE ONE (1) TABLET BY MOUTH EVERY DAY (Patient taking differently: Take 75 mg by mouth every other day. ) 90 tablet 1 04/10/2018 at Unknown time  . lisinopril (PRINIVIL,ZESTRIL) 5 MG tablet Take 5 mg by mouth every evening.    04/10/2018 at Unknown time  . metoprolol tartrate (LOPRESSOR) 50 MG tablet TAKE ONE TABLET BY MOUTH TWICE A DAY (Patient taking differently: Take 50 mg by mouth 2 (two) times daily. ) 180 tablet 1 04/11/2018 at [place]  . naproxen sodium (ALEVE) 220 MG tablet Take 220-440 mg by mouth daily as needed (for pain).    04/11/2018 at Unknown time  . nitroGLYCERIN (NITROSTAT) 0.4 MG SL tablet Place 1 tablet (0.4 mg total) under the tongue  every 5 (five) minutes as needed for chest pain. 25 tablet 3 04/11/2018 at 1600  . oxyCODONE-acetaminophen (PERCOCET) 10-325 MG per tablet Take 1 tablet by mouth every 8 (eight) hours as needed for pain.    04/11/2018 at Unknown time  . simvastatin (ZOCOR) 20 MG tablet TAKE ONE TABLET BY MOUTH AT BEDTIME. (Patient taking differently: Take 20 mg by mouth every other day. bedtime) 90 tablet 3 04/10/2018 at Unknown time  . tiZANidine (ZANAFLEX) 4 MG tablet Take 4 mg by mouth at bedtime.    04/10/2018 at Unknown time  . torsemide (DEMADEX) 20 MG tablet TAKE ONE (1) TABLET BY MOUTH EVERY DAY (Patient taking differently: Take 20 mg by mouth daily as needed (for fluid). ) 90 tablet 1 unknown  . vitamin B-12 (CYANOCOBALAMIN) 1000 MCG tablet Take 1,000 mcg by mouth daily.   04/11/2018 at Unknown time    Assessment: 72 yo male s/p cath with multivessel CAD (for CABG vs PCI). Pharmacy consulted to restart heparin 8 hours post sheath removal (removed ~ 4pm; radial approach). Previous heparin rate was 1400 units/he  Goal of Therapy:  Heparin level 0.3-0.7 units/ml Monitor platelets by anticoagulation protocol: Yes   Plan:  -restart heparin at midnight at 1400 units/hr -Heparin level in 8 hours and daily wth CBC daily  Hildred Laser, PharmD Clinical Pharmacist Please check Amion for pharmacy contact number

## 2018-04-12 NOTE — Interval H&P Note (Signed)
Cath Lab Visit (complete for each Cath Lab visit)  Clinical Evaluation Leading to the Procedure:   ACS: Yes.    Non-ACS:    Anginal Classification: CCS IV  Anti-ischemic medical therapy: Minimal Therapy (1 class of medications)  Non-Invasive Test Results: No non-invasive testing performed  Prior CABG: No previous CABG      History and Physical Interval Note:  04/12/2018 3:35 PM  Maurice Munoz  has presented today for surgery, with the diagnosis of cp  The various methods of treatment have been discussed with the patient and family. After consideration of risks, benefits and other options for treatment, the patient has consented to  Procedure(s): LEFT HEART CATH AND CORONARY ANGIOGRAPHY (N/A) as a surgical intervention .  The patient's history has been reviewed, patient examined, no change in status, stable for surgery.  I have reviewed the patient's chart and labs.  Questions were answered to the patient's satisfaction.     Larae Grooms

## 2018-04-12 NOTE — Progress Notes (Signed)
Pt transferred in stable condition to [facility] via [place].

## 2018-04-13 ENCOUNTER — Encounter (HOSPITAL_COMMUNITY): Payer: Self-pay | Admitting: Interventional Cardiology

## 2018-04-13 DIAGNOSIS — Z87442 Personal history of urinary calculi: Secondary | ICD-10-CM | POA: Diagnosis not present

## 2018-04-13 DIAGNOSIS — M25512 Pain in left shoulder: Secondary | ICD-10-CM | POA: Diagnosis not present

## 2018-04-13 DIAGNOSIS — R296 Repeated falls: Secondary | ICD-10-CM | POA: Diagnosis not present

## 2018-04-13 DIAGNOSIS — E785 Hyperlipidemia, unspecified: Secondary | ICD-10-CM | POA: Diagnosis not present

## 2018-04-13 DIAGNOSIS — I200 Unstable angina: Secondary | ICD-10-CM | POA: Diagnosis not present

## 2018-04-13 DIAGNOSIS — M109 Gout, unspecified: Secondary | ICD-10-CM | POA: Diagnosis present

## 2018-04-13 DIAGNOSIS — Z888 Allergy status to other drugs, medicaments and biological substances status: Secondary | ICD-10-CM | POA: Diagnosis not present

## 2018-04-13 DIAGNOSIS — N183 Chronic kidney disease, stage 3 (moderate): Secondary | ICD-10-CM | POA: Diagnosis not present

## 2018-04-13 DIAGNOSIS — I129 Hypertensive chronic kidney disease with stage 1 through stage 4 chronic kidney disease, or unspecified chronic kidney disease: Secondary | ICD-10-CM | POA: Diagnosis not present

## 2018-04-13 DIAGNOSIS — Z79899 Other long term (current) drug therapy: Secondary | ICD-10-CM | POA: Diagnosis not present

## 2018-04-13 DIAGNOSIS — I214 Non-ST elevation (NSTEMI) myocardial infarction: Secondary | ICD-10-CM | POA: Diagnosis present

## 2018-04-13 DIAGNOSIS — R609 Edema, unspecified: Secondary | ICD-10-CM | POA: Diagnosis not present

## 2018-04-13 DIAGNOSIS — Z7902 Long term (current) use of antithrombotics/antiplatelets: Secondary | ICD-10-CM | POA: Diagnosis not present

## 2018-04-13 DIAGNOSIS — Z6831 Body mass index (BMI) 31.0-31.9, adult: Secondary | ICD-10-CM | POA: Diagnosis not present

## 2018-04-13 DIAGNOSIS — J849 Interstitial pulmonary disease, unspecified: Secondary | ICD-10-CM | POA: Diagnosis not present

## 2018-04-13 DIAGNOSIS — I25110 Atherosclerotic heart disease of native coronary artery with unstable angina pectoris: Secondary | ICD-10-CM | POA: Diagnosis not present

## 2018-04-13 DIAGNOSIS — Z880 Allergy status to penicillin: Secondary | ICD-10-CM | POA: Diagnosis not present

## 2018-04-13 DIAGNOSIS — K5909 Other constipation: Secondary | ICD-10-CM | POA: Diagnosis not present

## 2018-04-13 DIAGNOSIS — J8410 Pulmonary fibrosis, unspecified: Secondary | ICD-10-CM | POA: Diagnosis not present

## 2018-04-13 DIAGNOSIS — Z7982 Long term (current) use of aspirin: Secondary | ICD-10-CM | POA: Diagnosis not present

## 2018-04-13 DIAGNOSIS — I10 Essential (primary) hypertension: Secondary | ICD-10-CM | POA: Diagnosis not present

## 2018-04-13 DIAGNOSIS — I2510 Atherosclerotic heart disease of native coronary artery without angina pectoris: Secondary | ICD-10-CM | POA: Diagnosis not present

## 2018-04-13 DIAGNOSIS — E669 Obesity, unspecified: Secondary | ICD-10-CM | POA: Diagnosis not present

## 2018-04-13 DIAGNOSIS — Z8249 Family history of ischemic heart disease and other diseases of the circulatory system: Secondary | ICD-10-CM | POA: Diagnosis not present

## 2018-04-13 DIAGNOSIS — G894 Chronic pain syndrome: Secondary | ICD-10-CM | POA: Diagnosis not present

## 2018-04-13 DIAGNOSIS — Z87891 Personal history of nicotine dependence: Secondary | ICD-10-CM | POA: Diagnosis not present

## 2018-04-13 DIAGNOSIS — Z955 Presence of coronary angioplasty implant and graft: Secondary | ICD-10-CM | POA: Diagnosis not present

## 2018-04-13 LAB — CBC
HCT: 43.8 % (ref 39.0–52.0)
Hemoglobin: 14.2 g/dL (ref 13.0–17.0)
MCH: 29.5 pg (ref 26.0–34.0)
MCHC: 32.4 g/dL (ref 30.0–36.0)
MCV: 90.9 fL (ref 80.0–100.0)
NRBC: 0 % (ref 0.0–0.2)
Platelets: 197 10*3/uL (ref 150–400)
RBC: 4.82 MIL/uL (ref 4.22–5.81)
RDW: 14.2 % (ref 11.5–15.5)
WBC: 6.2 10*3/uL (ref 4.0–10.5)

## 2018-04-13 LAB — LIPID PANEL
CHOL/HDL RATIO: 4.4 ratio
CHOLESTEROL: 146 mg/dL (ref 0–200)
HDL: 33 mg/dL — AB (ref >40)
LDL Cholesterol: 86 mg/dL (ref 0–99)
TRIGLYCERIDES: 133 mg/dL (ref <150)
VLDL: 27 mg/dL (ref 0–40)

## 2018-04-13 LAB — HIV ANTIBODY (ROUTINE TESTING W REFLEX): HIV SCREEN 4TH GENERATION: NONREACTIVE

## 2018-04-13 LAB — HEPARIN LEVEL (UNFRACTIONATED)
HEPARIN UNFRACTIONATED: 0.28 [IU]/mL — AB (ref 0.30–0.70)
Heparin Unfractionated: 0.26 IU/mL — ABNORMAL LOW (ref 0.30–0.70)

## 2018-04-13 MED ORDER — ASPIRIN 81 MG PO CHEW
81.0000 mg | CHEWABLE_TABLET | ORAL | Status: AC
  Administered 2018-04-14: 81 mg via ORAL
  Filled 2018-04-13: qty 1

## 2018-04-13 MED ORDER — SODIUM CHLORIDE 0.9% FLUSH: 3.0000 mL | Freq: Two times a day (BID) | INTRAVENOUS | Status: DC

## 2018-04-13 MED ORDER — SODIUM CHLORIDE 0.9 % WEIGHT BASED INFUSION
1.0000 mL/kg/h | INTRAVENOUS | Status: DC
  Administered 2018-04-13: 1 mL/kg/h via INTRAVENOUS

## 2018-04-13 MED ORDER — ATORVASTATIN CALCIUM 40 MG PO TABS
40.0000 mg | ORAL_TABLET | Freq: Every day | ORAL | Status: DC
  Administered 2018-04-13: 40 mg via ORAL
  Filled 2018-04-13: qty 1

## 2018-04-13 MED ORDER — SODIUM CHLORIDE 0.9% FLUSH: 3.0000 mL | INTRAVENOUS | Status: DC | PRN

## 2018-04-13 MED ORDER — SODIUM CHLORIDE 0.9 % IV SOLN
250.0000 mL | INTRAVENOUS | Status: DC | PRN
  Administered 2018-04-14: 250 mL via INTRAVENOUS

## 2018-04-13 NOTE — Progress Notes (Signed)
[place] for heparin Indication: chest pain/ACS  Allergies  Allergen Reactions  . Ibuprofen Itching    Motrin brand  . Neosporin [Neomycin-Bacitracin Zn-Polymyx] Swelling  . Penicillins Rash     Has patient had a PCN reaction causing immediate rash, facial/tongue/throat swelling, SOB or lightheadedness with hypotension: No Has patient had a PCN reaction causing severe rash involving mucus membranes or skin necrosis: No Has patient had a PCN reaction that required hospitalization: No Has patient had a PCN reaction occurring within the last 10 years: No If all of the above answers are "NO", then may proceed with Cephalosporin use.     Patient Measurements: Height: 6\' 1"  (185.4 cm) Weight: 243 lb 13.3 oz (110.6 kg) IBW/kg (Calculated) : 79.9 Heparin Dosing Weight: 101 kg  Vital Signs: Temp: 97.9 F (36.6 C) (10/24 1155) Temp Source: Oral (10/24 1155) BP: 143/72 (10/24 1155) Pulse Rate: 59 (10/24 1155)  Labs: Recent Labs    04/11/18 2113 04/12/18 0005 04/12/18 0623 04/12/18 1247 04/13/18 0513 04/13/18 1621  HGB 14.4  --  13.3  --  14.2  --   HCT 45.1  --  41.5  --  43.8  --   PLT 222  --  184  --  197  --   HEPARINUNFRC  --   --   --   --  0.26* 0.28*  CREATININE 1.11  --  1.06  --   --   --   TROPONINI  --  0.06* 0.06* 0.04*  --   --     Estimated Creatinine Clearance: 84.6 mL/min (by C-G formula based on SCr of 1.06 mg/dL).   Medical History: Past Medical History:  Diagnosis Date  . Adrenal adenoma, left    small  . Aortic atherosclerosis ([place])   . Back pain   . Bilateral carotid artery stenosis followed by dr Claiborne Billings   per last carotid duplex [id number]  -- proximal LICA 08-67% ,  RICA 6-19%  . CAD (coronary artery disease)    a. s/p prior intervention to RCA and PDA in 1999 b. DES to LAD and distal RCA in 2007  . Cardiomegaly    Stable  . Chronic constipation   . Chronic pain syndrome   . CKD (chronic kidney  disease), stage III ([place])   . DDD (degenerative disc disease), cervical   . DDD (degenerative disc disease), lumbosacral   . Dyspnea   . Dyspnea on exertion   . Gout    per pt last inflared episode 2015 approx.  . Grade II diastolic dysfunction   . H/O epistaxis    per pt sees ENT dr Benjamine Mola for cauterization (pt takes plavix)  . History of acute pancreatitis 08/08/2014  . History of kidney stones   . HTN (hypertension)   . Hyperlipidemia   . Interstitial lung disease ([place])   . Left arm pain    s/p fall  . Nocturia   . Numbness of right foot    due to DDD lumbar  . Peripheral edema   . Pilonidal cyst   . Pleural effusion on right   . Pulmonary fibrosis ([place])    followed by pcp-- last chest CT 04-08-2016  . S/P drug eluting coronary stent placement 10/11/2005   mLAD and dRCA  . Wears dentures    upper  . Wears glasses     Medications:  Medications Prior to Admission  Medication Sig Dispense Refill Last Dose  . allopurinol (ZYLOPRIM) 300 MG tablet  Take 300 mg by mouth every morning.    04/11/2018 at Unknown time  . amLODipine (NORVASC) 5 MG tablet Take 1 tablet (5 mg total) by mouth daily. 90 tablet 3 04/11/2018 at Unknown time  . aspirin EC 81 MG tablet Take 81 mg by mouth daily.   04/11/2018 at Unknown time  . cholecalciferol (VITAMIN D) 1000 units tablet Take 1,000 Units by mouth every morning.    04/11/2018 at Unknown time  . clopidogrel (PLAVIX) 75 MG tablet TAKE ONE (1) TABLET BY MOUTH EVERY DAY (Patient taking differently: Take 75 mg by mouth every other day. ) 90 tablet 1 04/10/2018 at Unknown time  . lisinopril (PRINIVIL,ZESTRIL) 5 MG tablet Take 5 mg by mouth every evening.    04/10/2018 at Unknown time  . metoprolol tartrate (LOPRESSOR) 50 MG tablet TAKE ONE TABLET BY MOUTH TWICE A DAY (Patient taking differently: Take 50 mg by mouth 2 (two) times daily. ) 180 tablet 1 04/11/2018 at [place]  . naproxen sodium (ALEVE) 220 MG tablet Take 220-440 mg by mouth daily as needed  (for pain).    04/11/2018 at Unknown time  . nitroGLYCERIN (NITROSTAT) 0.4 MG SL tablet Place 1 tablet (0.4 mg total) under the tongue every 5 (five) minutes as needed for chest pain. 25 tablet 3 04/11/2018 at 1600  . oxyCODONE-acetaminophen (PERCOCET) 10-325 MG per tablet Take 1 tablet by mouth every 8 (eight) hours as needed for pain.    04/11/2018 at Unknown time  . simvastatin (ZOCOR) 20 MG tablet TAKE ONE TABLET BY MOUTH AT BEDTIME. (Patient taking differently: Take 20 mg by mouth every other day. bedtime) 90 tablet 3 04/10/2018 at Unknown time  . tiZANidine (ZANAFLEX) 4 MG tablet Take 4 mg by mouth at bedtime.    04/10/2018 at Unknown time  . torsemide (DEMADEX) 20 MG tablet TAKE ONE (1) TABLET BY MOUTH EVERY DAY (Patient taking differently: Take 20 mg by mouth daily as needed (for fluid). ) 90 tablet 1 unknown  . vitamin B-12 (CYANOCOBALAMIN) 1000 MCG tablet Take 1,000 mcg by mouth daily.   04/11/2018 at Unknown time    Assessment: 72 yo male s/p cath with multivessel CAD (for CABG vs PCI). Pharmacy consulted to restart heparin post cath -heparin level up to 0.28 on 1500 units/hr  Goal of Therapy:  Heparin level 0.3-0.7 units/ml Monitor platelets by anticoagulation protocol: Yes   Plan:  -Increase heparin to 1600 units/hr -daily heparin level and CBC  Hildred Laser, PharmD Clinical Pharmacist Please check Amion for pharmacy contact number

## 2018-04-13 NOTE — Progress Notes (Signed)
   We have discussed with Dr. Irish Lack.  Dr. Vivi Martens note reviewed.  We will place him on the cath for tomorrow for Dr. Claiborne Billings, RCA PCI. Orders have been written.  Candee Furbish, MD'

## 2018-04-13 NOTE — H&P (View-Only) (Signed)
   We have discussed with Dr. Irish Lack.  Dr. Vivi Martens note reviewed.  We will place him on the cath for tomorrow for Dr. Claiborne Billings, RCA PCI. Orders have been written.  Candee Furbish, MD'

## 2018-04-13 NOTE — Consult Note (Signed)
[address]       [place], [phone]             [phone]      Cardiothoracic Surgery Consultation  Reason for Consult: Severe multi-vessel coronary artery disease s/p NSTEMI. Referring Physician: Dr. Casandra Doffing  Maurice Munoz is an 72 y.o. male.  HPI:   The patient is a 72 year old gentleman with history of hypertension, hyperlipidemia, atherosclerotic coronary artery disease status post PCI and stenting of the RCA in 1999 and subsequent PCI of the LAD and distal RCA in 2007 with drug-eluting stents, stage III chronic kidney disease, and a strong family history of heart disease, interstitial lung disease due to a combination of occupational exposure and remote smoking, as well as severe degenerative spine disease with decreased mobility.  The patient has had some chronic shortness of breath with exertion for years that was felt to be to due to his lung disease.  He has been followed by [name].  He reports a few week history of intermittent substernal discomfort with exertion.  This worsened over the days prior to admission.  On Tuesday he went fishing and felt fine until he was retrieving things out of his boat at the end of the day and developed severe substernal chest pain.  His troponin was mildly elevated at 0.06.  His last Myoview stress test on 08/12/2017 had been a low risk study with a normal ejection fraction.  An echocardiogram at that time showed an ejection fraction of 60 to 65% with grade 2 diastolic dysfunction and no significant valvular disease.  Cardiac catheterization yesterday showed severe three-vessel disease.  The culprit appeared to be a 95% distal RCA stenosis beyond the previously placed stent which was widely patent.  The posterior descending branch had a 80% distal stenosis.  The LAD had 50% proximal stenosis followed by a patent stent and then 80% mid vessel stenosis.  There is 75% distal stenosis.  The left circumflex and 80% stenosis  in the first marginal branch and then an 80% stenosis in the continuation before the second marginal branch.  The LVEDP was 16.  The patient's brothers and sisters as well as his wife are here with him today.  They will report that he has not been very active due to his chronic degenerative spine problems.  He has decreased balance and has had several falls.  He does walk with a cane.  He has a fairly sedentary lifestyle and said he cannot stand up or walk for very long.  Past Medical History:  Diagnosis Date  . Adrenal adenoma, left    small  . Aortic atherosclerosis ([employer])   . Back pain   . Bilateral carotid artery stenosis followed by dr Claiborne Billings   per last carotid duplex [id number]  -- proximal LICA 18-29% ,  RICA 9-37%  . CAD (coronary artery disease)    a. s/p prior intervention to RCA and PDA in 1999 b. DES to LAD and distal RCA in 2007  . Cardiomegaly    Stable  . Chronic constipation   . Chronic pain syndrome   . CKD (chronic kidney disease), stage III ([place])   . DDD (degenerative disc disease), cervical   . DDD (degenerative disc disease), lumbosacral   . Dyspnea   . Dyspnea on exertion   . Gout    per pt last inflared episode 2015 approx.  . Grade II diastolic dysfunction   . H/O epistaxis  per pt sees ENT dr Benjamine Mola for cauterization (pt takes plavix)  . History of acute pancreatitis 08/08/2014  . History of kidney stones   . HTN (hypertension)   . Hyperlipidemia   . Interstitial lung disease ([place])   . Left arm pain    s/p fall  . Nocturia   . Numbness of right foot    due to DDD lumbar  . Peripheral edema   . Pilonidal cyst   . Pleural effusion on right   . Pulmonary fibrosis ([place])    followed by pcp-- last chest CT 04-08-2016  . S/P drug eluting coronary stent placement 10/11/2005   mLAD and dRCA  . Wears dentures    upper  . Wears glasses     Past Surgical History:  Procedure Laterality Date  . ANTERIOR CERVICAL DECOMP/DISCECTOMY FUSION  12-25-2001     dr  Orinda Kenner [facility]   C3 -- C7  . ANTERIOR CERVICAL DECOMP/DISCECTOMY FUSION  12-11-2012   dr Carloyn Manner  [facility])  . CARDIOVASCULAR STRESS TEST  08-23-2012    dr Claiborne Billings   normal lexiscan nuclear study w/ no ischemia/  normal LV function and wall motion, ef 65%  . CARDIOVASCULAR STRESS TEST    . CORONARY ANGIOPLASTY  1999   PTCA to RCA & PDA  . CORONARY ANGIOPLASTY WITH STENT PLACEMENT  10-11-2005  dr Shelva Majestic   PCI and stenting to [place] ([place] DES x2)  . CYSTOSCOPY W/ URETERAL STENT PLACEMENT Left 02-11-2010     [facility]  . LEFT HEART CATH AND CORONARY ANGIOGRAPHY N/A 04/12/2018   Procedure: LEFT HEART CATH AND CORONARY ANGIOGRAPHY;  Surgeon: Jettie Booze, MD;  Location: [place] [facility];  Service: Cardiovascular;  Laterality: N/A;  . LUMBAR LAMINECTOMY  2015   L5 -- S1  . PILONIDAL CYST EXCISION  1980s  . PILONIDAL CYST EXCISION N/A 03/31/2017   Procedure: EXCISION OF PILONIDAL DISEASE with Flap Rotation;  Surgeon: Leighton Ruff, MD;  Location: [place];  Service: General;  Laterality: N/A;  . THROAT SURGERY  1996   per pt "removal piece of cryloid(?) that was pressing against throat"  states no issues since  . TRANSTHORACIC ECHOCARDIOGRAM  08-24-2010  dr Claiborne Billings   ef 50-55%/  mild MR/  trivial TR  . WOUND DEBRIDEMENT N/A 10/06/2017   Procedure: DEBRIDEMENT WOUND PLACEMENT OF WOUND HEALING MATRIX;  Surgeon: Leighton Ruff, MD;  Location: [place];  Service: General;  Laterality: N/A;    Family History  Problem Relation Age of Onset  . Cancer Father   . Heart attack Brother   . Diabetes Brother   . Colon cancer Neg Hx     Social History:  reports that he quit smoking about 29 years ago. His smoking use included cigarettes. He has a 75.00 pack-year smoking history. He quit smokeless tobacco use about 29 years ago. He reports that he does not drink alcohol or use drugs.  Allergies:  Allergies  Allergen Reactions  . Ibuprofen  Itching    Motrin brand  . Neosporin [Neomycin-Bacitracin Zn-Polymyx] Swelling  . Penicillins Rash     Has patient had a PCN reaction causing immediate rash, facial/tongue/throat swelling, SOB or lightheadedness with hypotension: No Has patient had a PCN reaction causing severe rash involving mucus membranes or skin necrosis: No Has patient had a PCN reaction that required hospitalization: No Has patient had a PCN reaction occurring within the last 10 years: No If all of the  above answers are "NO", then may proceed with Cephalosporin use.     Medications:  I have reviewed the patient's current medications. Prior to Admission:  Medications Prior to Admission  Medication Sig Dispense Refill Last Dose  . allopurinol (ZYLOPRIM) 300 MG tablet Take 300 mg by mouth every morning.    04/11/2018 at Unknown time  . amLODipine (NORVASC) 5 MG tablet Take 1 tablet (5 mg total) by mouth daily. 90 tablet 3 04/11/2018 at Unknown time  . aspirin EC 81 MG tablet Take 81 mg by mouth daily.   04/11/2018 at Unknown time  . cholecalciferol (VITAMIN D) 1000 units tablet Take 1,000 Units by mouth every morning.    04/11/2018 at Unknown time  . clopidogrel (PLAVIX) 75 MG tablet TAKE ONE (1) TABLET BY MOUTH EVERY DAY (Patient taking differently: Take 75 mg by mouth every other day. ) 90 tablet 1 04/10/2018 at Unknown time  . lisinopril (PRINIVIL,ZESTRIL) 5 MG tablet Take 5 mg by mouth every evening.    04/10/2018 at Unknown time  . metoprolol tartrate (LOPRESSOR) 50 MG tablet TAKE ONE TABLET BY MOUTH TWICE A DAY (Patient taking differently: Take 50 mg by mouth 2 (two) times daily. ) 180 tablet 1 04/11/2018 at [place]  . naproxen sodium (ALEVE) 220 MG tablet Take 220-440 mg by mouth daily as needed (for pain).    04/11/2018 at Unknown time  . nitroGLYCERIN (NITROSTAT) 0.4 MG SL tablet Place 1 tablet (0.4 mg total) under the tongue every 5 (five) minutes as needed for chest pain. 25 tablet 3 04/11/2018 at 1600  .  oxyCODONE-acetaminophen (PERCOCET) 10-325 MG per tablet Take 1 tablet by mouth every 8 (eight) hours as needed for pain.    04/11/2018 at Unknown time  . simvastatin (ZOCOR) 20 MG tablet TAKE ONE TABLET BY MOUTH AT BEDTIME. (Patient taking differently: Take 20 mg by mouth every other day. bedtime) 90 tablet 3 04/10/2018 at Unknown time  . tiZANidine (ZANAFLEX) 4 MG tablet Take 4 mg by mouth at bedtime.    04/10/2018 at Unknown time  . torsemide (DEMADEX) 20 MG tablet TAKE ONE (1) TABLET BY MOUTH EVERY DAY (Patient taking differently: Take 20 mg by mouth daily as needed (for fluid). ) 90 tablet 1 unknown  . vitamin B-12 (CYANOCOBALAMIN) 1000 MCG tablet Take 1,000 mcg by mouth daily.   04/11/2018 at Unknown time   Scheduled: . amLODipine  5 mg Oral Daily  . aspirin EC  81 mg Oral Daily  . atorvastatin  40 mg Oral q1800  . cholecalciferol  1,000 Units Oral q morning - 10a  . clopidogrel  75 mg Oral Daily  . metoprolol tartrate  50 mg Oral BID  . sodium chloride flush  3 mL Intravenous Q12H  . tiZANidine  4 mg Oral QHS   Continuous: . sodium chloride 10 mL/hr at 04/12/18 0038  . sodium chloride    . heparin 1,500 Units/hr (04/13/18 1165)   BXU:XYBFXO chloride, acetaminophen **OR** acetaminophen, nitroGLYCERIN, ondansetron (ZOFRAN) IV, oxyCODONE-acetaminophen **AND** oxyCODONE, sodium chloride flush Anti-infectives (From admission, onward)   None      Results for orders placed or performed during the hospital encounter of 04/11/18 (from the past 48 hour(s))  Basic metabolic panel     Status: None   Collection Time: 04/11/18  9:13 PM  Result Value Ref Range   Sodium 137 135 - 145 mmol/L   Potassium 4.0 3.5 - 5.1 mmol/L   Chloride 103 98 - 111 mmol/L   CO2 25  22 - 32 mmol/L   Glucose, Bld 94 70 - 99 mg/dL   BUN 22 8 - 23 mg/dL   Creatinine, Ser 1.11 0.61 - 1.24 mg/dL   Calcium 9.2 8.9 - 10.3 mg/dL   GFR calc non Af Amer >60 >60 mL/min   GFR calc Af Amer >60 >60 mL/min    Comment:  (NOTE) The eGFR has been calculated using the CKD EPI equation. This calculation has not been validated in all clinical situations. eGFR's persistently <60 mL/min signify possible Chronic Kidney Disease.    Anion gap 9 5 - 15    Comment: Performed at [facility], [address], [place], [place] [address]  CBC     Status: None   Collection Time: 04/11/18  9:13 PM  Result Value Ref Range   WBC 8.1 4.0 - 10.5 K/uL   RBC 4.99 4.22 - 5.81 MIL/uL   Hemoglobin 14.4 13.0 - 17.0 g/dL   HCT 45.1 39.0 - 52.0 %   MCV 90.4 80.0 - 100.0 fL   MCH 28.9 26.0 - 34.0 pg   MCHC 31.9 30.0 - 36.0 g/dL   RDW 14.4 11.5 - 15.5 %   Platelets 222 150 - 400 K/uL   nRBC 0.0 0.0 - 0.2 %    Comment: Performed at [facility], [address], [place], [place] [address]  Brain natriuretic peptide     Status: Abnormal   Collection Time: 04/11/18  9:13 PM  Result Value Ref Range   B Natriuretic Peptide 195.0 (H) 0.0 - 100.0 pg/mL    Comment: Performed at [facility], [address] [place], [place] [address]  I-stat troponin, ED     Status: None   Collection Time: 04/11/18  9:22 PM  Result Value Ref Range   Troponin i, poc 0.00 0.00 - 0.08 ng/mL   Comment 3            Comment: Due to the release kinetics of cTnI, a negative result within the first hours of the onset of symptoms does not rule out myocardial infarction with certainty. If myocardial infarction is still suspected, repeat the test at appropriate intervals.   HIV antibody (Routine Testing)     Status: None   Collection Time: 04/12/18 12:05 AM  Result Value Ref Range   HIV Screen 4th Generation wRfx Non Reactive Non Reactive    Comment: (NOTE) Performed At: [facility] [place], [place] [id number] Rush Farmer MD [id number]   Troponin I     Status: Abnormal   Collection Time: 04/12/18 12:05 AM  Result Value Ref Range   Troponin I 0.06 (HH) <0.03 ng/mL    Comment: CRITICAL RESULT CALLED TO, READ BACK BY AND  VERIFIED WITH: TALBOT,T AT 1257 BY HUFFINES,S ON 04/12/18. Performed at [facility], [address], [place], [place] [address]   Troponin I     Status: Abnormal   Collection Time: 04/12/18  6:23 AM  Result Value Ref Range   Troponin I 0.06 (HH) <0.03 ng/mL    Comment: CRITICAL VALUE NOTED.  VALUE IS CONSISTENT WITH PREVIOUSLY REPORTED AND CALLED VALUE. Performed at [facility], [address] [place], [place] [address]   CBC     Status: None   Collection Time: 04/12/18  6:23 AM  Result Value Ref Range   WBC 6.0 4.0 - 10.5 K/uL   RBC 4.56 4.22 - 5.81 MIL/uL   Hemoglobin 13.3 13.0 - 17.0 g/dL   HCT 41.5 39.0 - 52.0 %  MCV 91.0 80.0 - 100.0 fL   MCH 29.2 26.0 - 34.0 pg   MCHC 32.0 30.0 - 36.0 g/dL   RDW 14.3 11.5 - 15.5 %   Platelets 184 150 - 400 K/uL   nRBC 0.0 0.0 - 0.2 %    Comment: Performed at [facility], [address], [place], [place] [address]  Comprehensive metabolic panel     Status: Abnormal   Collection Time: 04/12/18  6:23 AM  Result Value Ref Range   Sodium 139 135 - 145 mmol/L   Potassium 3.7 3.5 - 5.1 mmol/L   Chloride 106 98 - 111 mmol/L   CO2 27 22 - 32 mmol/L   Glucose, Bld 85 70 - 99 mg/dL   BUN 20 8 - 23 mg/dL   Creatinine, Ser 1.06 0.61 - 1.24 mg/dL   Calcium 8.9 8.9 - 10.3 mg/dL   Total Protein 6.3 (L) 6.5 - 8.1 g/dL   Albumin 3.7 3.5 - 5.0 g/dL   AST 20 15 - 41 U/L   ALT 14 0 - 44 U/L   Alkaline Phosphatase 72 38 - 126 U/L   Total Bilirubin 1.3 (H) 0.3 - 1.2 mg/dL   GFR calc non Af Amer >60 >60 mL/min   GFR calc Af Amer >60 >60 mL/min    Comment: (NOTE) The eGFR has been calculated using the CKD EPI equation. This calculation has not been validated in all clinical situations. eGFR's persistently <60 mL/min signify possible Chronic Kidney Disease.    Anion gap 6 5 - 15    Comment: Performed at [facility], [address] [place], [place] [address]  Troponin I     Status: Abnormal   Collection Time: 04/12/18 12:47 PM  Result Value Ref  Range   Troponin I 0.04 (HH) <0.03 ng/mL    Comment: CRITICAL VALUE NOTED.  VALUE IS CONSISTENT WITH PREVIOUSLY REPORTED AND CALLED VALUE. Performed at [facility], [address] [place], [place] [address]   Lipid panel     Status: Abnormal   Collection Time: 04/13/18  5:13 AM  Result Value Ref Range   Cholesterol 146 0 - 200 mg/dL   Triglycerides 133 <150 mg/dL   HDL 33 (L) >40 mg/dL   Total CHOL/HDL Ratio 4.4 RATIO   VLDL 27 0 - 40 mg/dL   LDL Cholesterol 86 0 - 99 mg/dL    Comment:        Total Cholesterol/HDL:CHD Risk Coronary Heart Disease Risk Table                     Men   Women  1/2 Average Risk   3.4   3.3  Average Risk       5.0   4.4  2 X Average Risk   9.6   7.1  3 X Average Risk  23.4   11.0        Use the calculated Patient Ratio above and the CHD Risk Table to determine the patient's CHD Risk.        ATP III CLASSIFICATION (LDL):  <100     mg/dL   Optimal  100-129  mg/dL   Near or Above                    Optimal  130-159  mg/dL   Borderline  160-189  mg/dL   High  >190     mg/dL   Very High Performed at [facility]  [address] [place] [address]   CBC     Status: None   Collection Time: 04/13/18  5:13 AM  Result Value Ref Range   WBC 6.2 4.0 - 10.5 K/uL   RBC 4.82 4.22 - 5.81 MIL/uL   Hemoglobin 14.2 13.0 - 17.0 g/dL   HCT 43.8 39.0 - 52.0 %   MCV 90.9 80.0 - 100.0 fL   MCH 29.5 26.0 - 34.0 pg   MCHC 32.4 30.0 - 36.0 g/dL   RDW 14.2 11.5 - 15.5 %   Platelets 197 150 - 400 K/uL   nRBC 0.0 0.0 - 0.2 %    Comment: Performed at [facility], [place] [address], [place], [place] [address]  Heparin level (unfractionated)     Status: Abnormal   Collection Time: 04/13/18  5:13 AM  Result Value Ref Range   Heparin Unfractionated 0.26 (L) 0.30 - 0.70 IU/mL    Comment: (NOTE) If heparin results are below expected values, and patient dosage has  been confirmed, suggest follow up testing of antithrombin III levels. Performed at  [facility], [place] [address] [place], [place] [address]     Dg Chest 2 View  Result Date: 04/11/2018 CLINICAL DATA:  Chest pain EXAM: CHEST - 2 VIEW COMPARISON:  CT 04/06/2017 FINDINGS: Rounded masslike area noted in the left hilar/AP window region, not definitively seen on prior study. Heart is mildly enlarged. No effusions. Diffuse interstitial prominence throughout the lungs compatible with chronic interstitial lung disease. IMPRESSION: Large masslike area in the left hilar/AP window region. Recommend further evaluation with chest CT with IV contrast. Cardiomegaly. Chronic interstitial lung disease. Electronically Signed   By: Rolm Baptise M.D.   On: 04/11/2018 22:48   Ct Chest W Contrast  Result Date: 04/12/2018 CLINICAL DATA:  Chest pain. Abnormal chest x-ray with left perihilar opacity. EXAM: CT CHEST WITH CONTRAST TECHNIQUE: Multidetector CT imaging of the chest was performed during intravenous contrast administration. CONTRAST:  87m OMNIPAQUE IOHEXOL 300 MG/ML  SOLN COMPARISON:  Chest radiograph earlier this day. High-resolution chest CT 04/06/2017 FINDINGS: Cardiovascular: Aortic atherosclerosis without dissection or aneurysm. There are coronary artery calcifications. Multi chamber cardiomegaly. No pericardial effusion. Mediastinum/Nodes: Multifocal prominent and borderline enlarged mediastinal and hilar lymph nodes as before. Prevascular node measures 14 mm, previously 15 mm. Right infrahilar node measures 15 mm, previously 12 mm. Lower right hilar node measures 12 mm, previously not well-defined in the absence of IV contrast. Mediastinal and hilar nodes are grossly stable. Thyroid gland is unremarkable. The esophagus is nondistended. No hilar mass to account for radiographic findings, small left hilar lymph nodes are similar to prior exam. Lungs/Pleura: Again seen interstitial lung disease with peripheral honeycombing, bronchiectasis, septal thickening and ground-glass opacities.  Overall findings are not significantly changed from prior high-resolution chest CT. No evidence of pulmonary mass to account for radiographic finding. Tiny bibasilar calcifications suggesting prior granulomatous disease, unchanged. The trachea and mainstem bronchi are patent. The small right pleural effusion on prior exam has resolved. No evidence of superimposed pulmonary edema or acute airspace disease. Upper Abdomen: No acute findings. Unchanged left adrenal adenoma. Gallstone without pericholecystic inflammation. Musculoskeletal: There are no acute or suspicious osseous abnormalities. Chronic degenerative change in the spine. IMPRESSION: 1. No pulmonary mass or acute findings to account for masslike area on radiograph. Radiographic findings may have been secondary to patient rotation. 2. Chronic interstitial lung disease, not significantly changed from prior exam. Multiple prominent and borderline enlarged mediastinal or hilar lymph nodes  are not significantly changed from prior exam, likely reactive in the setting of interstitial lung disease. No definite acute superimposed abnormality. 3. Cardiomegaly. Aortic Atherosclerosis (ICD10-I70.0). Coronary artery calcifications. Electronically Signed   By: Keith Rake M.D.   On: 04/12/2018 00:06   Dg Shoulder Left  Result Date: 04/11/2018 CLINICAL DATA:  Left chest pain, shoulder pain EXAM: LEFT SHOULDER - 2+ VIEW COMPARISON:  None. FINDINGS: Degenerative changes in the left [facility] and glenohumeral joints with joint space narrowing and spurring. No acute bony abnormality. Specifically, no fracture, subluxation, or dislocation. IMPRESSION: Degenerative changes.  No acute bony abnormality. Electronically Signed   By: Rolm Baptise M.D.   On: 04/11/2018 22:49    Review of Systems  Constitutional: Positive for malaise/fatigue.  HENT: Negative.        History of nosebleeds in the past while on aspirin 325 mg daily and Plavix 75 mg daily.  Eyes: Negative.     Respiratory: Positive for shortness of breath.   Cardiovascular: Positive for chest pain and leg swelling. Negative for orthopnea and PND.  Gastrointestinal: Negative.   Genitourinary: Negative.   Musculoskeletal: Positive for back pain and neck pain.  Skin: Negative.   Neurological: Negative for dizziness.  Endo/Heme/Allergies: Negative.   Psychiatric/Behavioral: Negative.    Blood pressure 131/69, pulse 61, temperature 97.8 F (36.6 C), temperature source Oral, resp. rate 20, height _0  (1.854 m), weight 110.6 kg, SpO2 98 %. Physical Exam  Constitutional: He is oriented to person, place, and time.  Obese gentleman in no distress  HENT:  Head: Normocephalic and atraumatic.  Mouth/Throat: Oropharynx is clear and moist.  Eyes: Pupils are equal, round, and reactive to light. EOM are normal.  Neck: Normal range of motion. Neck supple. No JVD present. No thyromegaly present.  Cardiovascular: Normal rate, regular rhythm and normal heart sounds.  No murmur heard. Respiratory: Effort normal.  Crackles in both lower lobe  GI: Soft. Bowel sounds are normal. He exhibits no distension. There is no tenderness.  Musculoskeletal: Normal range of motion. He exhibits edema.  Neurological: He is alert and oriented to person, place, and time.  Skin: Skin is warm and dry.  Psychiatric: He has a normal mood and affect.   Physicians   Panel Physicians Referring Physician Case Authorizing Physician  Jettie Booze, MD (Primary)    Procedures   LEFT HEART CATH AND CORONARY ANGIOGRAPHY  Conclusion     The left ventricular ejection fraction is 50-55% by visual estimate.  The left ventricular systolic function is normal.  LV end diastolic pressure is mildly elevated. LVEDP 16 mm Hg.  There is no aortic valve stenosis.  Previously placed Dist RCA-1 stent (unknown type) is widely patent.  Dist RCA-2 lesion is 95% stenosed.  RPDA lesion is 80% stenosed.  Prox Cx lesion is 80%  stenosed.  Ost 1st Mrg to 1st Mrg lesion is 80% stenosed.  Previously placed Mid LAD-1 stent (unknown type) is widely patent.  Mid LAD-2 lesion is 75% stenosed.  Ost 2nd Diag to 2nd Diag lesion is 75% stenosed.  Dist LAD lesion is 75% stenosed.  Ost 1st Diag lesion is 50% stenosed.  Prox LAD lesion is 25% stenosed.   Multivessel CAD.  Plan for cardiac surgery consult.  He does have diffuse distal LAD disease which is not optimal for LIMA to LAD.  If he is not a candidate for CABG, would plan to bring back for PCI.       Indications   Unstable angina (  [place]) [I20.0 (ICD-10-CM)]  Procedural Details/Technique   Technical Details The risks, benefits, and details of the procedure were explained to the patient. The patient verbalized understanding and wanted to proceed. Informed written consent was obtained.  PROCEDURE TECHNIQUE: After Xylocaine anesthesia a 39F slender sheath was placed in the right radial artery with a single anterior needle wall stick. IV Heparin was given. Right coronary angiography was done using a Judkins R4 guide catheter. Left coronary angiography was done using a Judkins L3.5 guide catheter. Left ventriculography was done using a JR4 catheter. A TR band was used for hemostasis.  Contrast: 50 cc     Estimated blood loss <50 mL.  During this procedure the patient was administered the following to achieve and maintain moderate conscious sedation: Versed 2 mg, Fentanyl 25 mcg, while the patient's heart rate, blood pressure, and oxygen saturation were continuously monitored. The period of conscious sedation was 20 minutes, of which I was present face-to-face 100% of this time.  Complications   Complications documented before study signed (04/12/2018 4:17 PM EDT)    No complications were associated with this study.  Documented by Jettie Booze, MD - 04/12/2018 4:17 PM EDT    Coronary Findings   Diagnostic  Dominance: Right  Left Anterior  Descending  Prox LAD lesion 25% stenosed  Prox LAD lesion is 25% stenosed.  Mid LAD-1 lesion 0% stenosed  Previously placed Mid LAD-1 stent (unknown type) is widely patent.  Mid LAD-2 lesion 75% stenosed  Mid LAD-2 lesion is 75% stenosed.  Dist LAD lesion 75% stenosed  Dist LAD lesion is 75% stenosed.  First Diagonal Branch  Ost 1st Diag lesion 50% stenosed  Ost 1st Diag lesion is 50% stenosed. The lesion is located at the major branch.  Second Diagonal [employer] 2nd Diag to 2nd Diag lesion 75% stenosed  Ost 2nd Diag to 2nd Diag lesion is 75% stenosed.  Left Circumflex  Prox Cx lesion 80% stenosed  Prox Cx lesion is 80% stenosed.  First Obtuse Marginal Branch  Ost 1st Mrg to 1st Mrg lesion 80% stenosed  Ost 1st Mrg to 1st Mrg lesion is 80% stenosed. The stenosis was measured by a visual reading.  Right Coronary Artery  Dist RCA-1 lesion 0% stenosed  Previously placed Dist RCA-1 stent (unknown type) is widely patent.  Dist RCA-2 lesion 95% stenosed  Dist RCA-2 lesion is 95% stenosed.  Right Posterior Descending Artery  RPDA lesion 80% stenosed  RPDA lesion is 80% stenosed.  Intervention   No interventions have been documented.  Wall Motion   Resting       All segments of the heart are normal.          Left Heart   Left Ventricle The left ventricular size is normal. The left ventricular systolic function is normal. LV end diastolic pressure is mildly elevated. The left ventricular ejection fraction is 50-55% by visual estimate. No regional wall motion abnormalities.  Aortic Valve There is no aortic valve stenosis.  Coronary Diagrams   Diagnostic Diagram       Implants    No implant documentation for this case.  MERGE Images   Show images for CARDIAC CATHETERIZATION   Link to Procedure Log   Procedure Log    Hemo Data    Most Recent Value  AO Systolic Pressure 226 mmHg  AO Diastolic Pressure 73 mmHg  AO Mean 333 mmHg  LV Systolic Pressure 545 mmHg    LV Diastolic Pressure 2 mmHg  LV EDP 16 mmHg  AOp Systolic Pressure 654 mmHg  AOp Diastolic Pressure 72 mmHg  AOp Mean Pressure 650 mmHg  LVp Systolic Pressure 354 mmHg  LVp Diastolic Pressure 15 mmHg  LVp EDP Pressure 17 mmHg    Assessment/Plan:  This 72 year old gentleman has severe three-vessel coronary disease presenting with a non-ST segment elevation MI and mild troponin elevation.  The culprit is likely the 95% distal RCA stenosis.  He has diffuse disease throughout the LAD extending out to the apex.  The LAD is not an ideal artery for bypassing due to the diffuse nature of his disease and we would be essentially be bypassing a small segment of the LAD.  The left circumflex has noncritical stenoses.  The distal RCA lesion is the most significant and is amenable to PCI.  There is also an 80% lesion further out in the distal posterior descending branch.  His operative risk for coronary bypass graft surgery would be increased due to his multiple comorbid risk factors including obesity, at least moderate interstitial lung disease with severely reduced diffusion capacity, immobility due to chronic degenerative spine disease, chronic kidney disease, and diffuse coronary disease.  I think his best treatment option is probably PCI of the distal RCA lesion and then the LAD lesions at some point.  I discussed the case with Dr. Irish Lack and he feels that the lesions are amenable to PCI.  I discussed the catheterization findings and my recommendations with the patient and his family and answered all their questions.  I spent 45 minutes performing this consultation and > 50% of this time was spent face to face counseling and coordinating the care of this patient's severe multivessel coronary disease.  Fernande Boyden Yago Ludvigsen 04/13/2018, 11:40 AM

## 2018-04-13 NOTE — Progress Notes (Signed)
   04/13/18 1100  Clinical Encounter Type  Visited With Patient and family together  Visit Type Initial  Referral From Nurse  Spiritual Encounters  Spiritual Needs Prayer;Other (Comment)  Stress Factors  Patient Stress Factors None identified  Family Stress Factors None identified   Responded to spiritual care consult for an AD. Pt was alert and family was at bedside.Doctor was also present giving the PT information. Also, PT stated that their pastor visited the day prior. I gave PT a copy of the AD and offered spiritual care with ministry of presence, a listening ear, words of encouragement and prayer.

## 2018-04-13 NOTE — Progress Notes (Signed)
[place] for Heparin Indication: chest pain/ACS  Allergies  Allergen Reactions  . Ibuprofen Itching    Motrin brand  . Neosporin [Neomycin-Bacitracin Zn-Polymyx] Swelling  . Penicillins Rash     Has patient had a PCN reaction causing immediate rash, facial/tongue/throat swelling, SOB or lightheadedness with hypotension: No Has patient had a PCN reaction causing severe rash involving mucus membranes or skin necrosis: No Has patient had a PCN reaction that required hospitalization: No Has patient had a PCN reaction occurring within the last 10 years: No If all of the above answers are "NO", then may proceed with Cephalosporin use.     Patient Measurements: Height: 6\' 1"  (185.4 cm) Weight: 243 lb 13.3 oz (110.6 kg) IBW/kg (Calculated) : 79.9 Heparin Dosing Weight: 101 kg  Vital Signs: Temp: 98.2 F (36.8 C) (10/24 0457) Temp Source: Oral (10/24 0457) BP: 129/58 (10/24 0457) Pulse Rate: 58 (10/24 0457)  Labs: Recent Labs    04/11/18 2113 04/12/18 0005 04/12/18 0623 04/12/18 1247 04/13/18 0513  HGB 14.4  --  13.3  --  14.2  HCT 45.1  --  41.5  --  43.8  PLT 222  --  184  --  197  HEPARINUNFRC  --   --   --   --  0.26*  CREATININE 1.11  --  1.06  --   --   TROPONINI  --  0.06* 0.06* 0.04*  --     Estimated Creatinine Clearance: 84.6 mL/min (by C-G formula based on SCr of 1.06 mg/dL).   Medical History: Past Medical History:  Diagnosis Date  . Adrenal adenoma, left    small  . Aortic atherosclerosis ([place])   . Back pain   . Bilateral carotid artery stenosis followed by dr Claiborne Billings   per last carotid duplex [phone]  -- proximal LICA 79-02% ,  RICA 4-09%  . CAD (coronary artery disease)    a. s/p prior intervention to RCA and PDA in 1999 b. DES to LAD and distal RCA in 2007  . Cardiomegaly    Stable  . Chronic constipation   . Chronic pain syndrome   . CKD (chronic kidney disease), stage III ([place])   . DDD (degenerative  disc disease), cervical   . DDD (degenerative disc disease), lumbosacral   . Dyspnea   . Dyspnea on exertion   . Gout    per pt last inflared episode 2015 approx.  . Grade II diastolic dysfunction   . H/O epistaxis    per pt sees ENT dr Benjamine Mola for cauterization (pt takes plavix)  . History of acute pancreatitis 08/08/2014  . History of kidney stones   . HTN (hypertension)   . Hyperlipidemia   . Interstitial lung disease ([place])   . Left arm pain    s/p fall  . Nocturia   . Numbness of right foot    due to DDD lumbar  . Peripheral edema   . Pilonidal cyst   . Pleural effusion on right   . Pulmonary fibrosis ([place])    followed by pcp-- last chest CT 04-08-2016  . S/P drug eluting coronary stent placement 10/11/2005   mLAD and dRCA  . Wears dentures    upper  . Wears glasses     Medications:  Medications Prior to Admission  Medication Sig Dispense Refill Last Dose  . allopurinol (ZYLOPRIM) 300 MG tablet Take 300 mg by mouth every morning.    04/11/2018 at Unknown time  . amLODipine (NORVASC)  5 MG tablet Take 1 tablet (5 mg total) by mouth daily. 90 tablet 3 04/11/2018 at Unknown time  . aspirin EC 81 MG tablet Take 81 mg by mouth daily.   04/11/2018 at Unknown time  . cholecalciferol (VITAMIN D) 1000 units tablet Take 1,000 Units by mouth every morning.    04/11/2018 at Unknown time  . clopidogrel (PLAVIX) 75 MG tablet TAKE ONE (1) TABLET BY MOUTH EVERY DAY (Patient taking differently: Take 75 mg by mouth every other day. ) 90 tablet 1 04/10/2018 at Unknown time  . lisinopril (PRINIVIL,ZESTRIL) 5 MG tablet Take 5 mg by mouth every evening.    04/10/2018 at Unknown time  . metoprolol tartrate (LOPRESSOR) 50 MG tablet TAKE ONE TABLET BY MOUTH TWICE A DAY (Patient taking differently: Take 50 mg by mouth 2 (two) times daily. ) 180 tablet 1 04/11/2018 at [place]  . naproxen sodium (ALEVE) 220 MG tablet Take 220-440 mg by mouth daily as needed (for pain).    04/11/2018 at Unknown time  .  nitroGLYCERIN (NITROSTAT) 0.4 MG SL tablet Place 1 tablet (0.4 mg total) under the tongue every 5 (five) minutes as needed for chest pain. 25 tablet 3 04/11/2018 at 1600  . oxyCODONE-acetaminophen (PERCOCET) 10-325 MG per tablet Take 1 tablet by mouth every 8 (eight) hours as needed for pain.    04/11/2018 at Unknown time  . simvastatin (ZOCOR) 20 MG tablet TAKE ONE TABLET BY MOUTH AT BEDTIME. (Patient taking differently: Take 20 mg by mouth every other day. bedtime) 90 tablet 3 04/10/2018 at Unknown time  . tiZANidine (ZANAFLEX) 4 MG tablet Take 4 mg by mouth at bedtime.    04/10/2018 at Unknown time  . torsemide (DEMADEX) 20 MG tablet TAKE ONE (1) TABLET BY MOUTH EVERY DAY (Patient taking differently: Take 20 mg by mouth daily as needed (for fluid). ) 90 tablet 1 unknown  . vitamin B-12 (CYANOCOBALAMIN) 1000 MCG tablet Take 1,000 mcg by mouth daily.   04/11/2018 at Unknown time    Assessment: 72 yo male s/p cath with multivessel CAD (for CABG vs PCI). Pharmacy consulted to restart heparin 8 hours post sheath removal (removed ~ 4pm; radial approach). Previous heparin rate was 1400 units/hr.  10/24 AM update: heparin level just below goal this AM, drawn a little early, no issues per RN.   Goal of Therapy:  Heparin level 0.3-0.7 units/ml Monitor platelets by anticoagulation protocol: Yes   Plan:  Inc heparin to 1500 units/hr 1500 HL  Narda Bonds, PharmD, BCPS Clinical Pharmacist Phone: [phone]

## 2018-04-13 NOTE — Plan of Care (Signed)
  Problem: Education: Goal: Knowledge of General Education information will improve Description: Including pain rating scale, medication(s)/side effects and non-pharmacologic comfort measures Outcome: Progressing   Problem: Health Behavior/Discharge Planning: Goal: Ability to manage health-related needs will improve Outcome: Progressing   Problem: Clinical Measurements: Goal: Cardiovascular complication will be avoided Outcome: Progressing   Problem: Coping: Goal: Level of anxiety will decrease Outcome: Progressing   Problem: Pain Managment: Goal: General experience of comfort will improve Outcome: Progressing   

## 2018-04-13 NOTE — Progress Notes (Signed)
Progress Note  Patient Name: CALAHAN PAK Date of Encounter: 04/13/2018  Primary Cardiologist: Shelva Majestic, MD   Subjective   Previously has been having chest pain with minimal activity walking 20 feet that would improve with rest.  Relieved with sublingual nitroglycerin.  Cardiac catheterization performed yesterday showed severe triple-vessel CAD.  He states that his left shoulder injury has been bothering him quite a bit.  Otherwise no fevers chills nausea vomiting syncope.  Inpatient Medications    Scheduled Meds: . amLODipine  5 mg Oral Daily  . aspirin EC  81 mg Oral Daily  . cholecalciferol  1,000 Units Oral q morning - 10a  . clopidogrel  75 mg Oral Daily  . metoprolol tartrate  50 mg Oral BID  . simvastatin  20 mg Oral q1800  . sodium chloride flush  3 mL Intravenous Q12H  . tiZANidine  4 mg Oral QHS   Continuous Infusions: . sodium chloride 10 mL/hr at 04/12/18 0038  . sodium chloride    . heparin 1,500 Units/hr (04/13/18 8588)   PRN Meds: sodium chloride, acetaminophen **OR** acetaminophen, nitroGLYCERIN, ondansetron (ZOFRAN) IV, oxyCODONE-acetaminophen **AND** oxyCODONE, sodium chloride flush   Vital Signs    Vitals:   04/13/18 0010 04/13/18 0457 04/13/18 0814 04/13/18 1021  BP: (!) 117/53 (!) 129/58 138/70 131/69  Pulse: (!) 52 (!) 58 (!) 55 61  Resp: 18 18 20    Temp: 98.1 F (36.7 C) 98.2 F (36.8 C) 97.8 F (36.6 C)   TempSrc: Oral Oral Oral   SpO2: 95% 98% 98%   Weight:  110.6 kg    Height:        Intake/Output Summary (Last 24 hours) at 04/13/2018 1039 Last data filed at 04/13/2018 0943 Gross per 24 hour  Intake 1128.79 ml  Output 1050 ml  Net 78.79 ml   Filed Weights   04/12/18 0934 04/12/18 1817 04/13/18 0457  Weight: 111.1 kg 116.8 kg 110.6 kg    Telemetry    No adverse arrhythmias sinus rhythm- Personally Reviewed  ECG    Sinus rhythm 65 no ischemic changes- Personally Reviewed  Physical Exam   GEN: No acute  distress.  Overweight Neck: No JVD Cardiac: RRR, no murmurs, rubs, or gallops.  Respiratory: Mild crackles heard on lung exam bilaterally. GI: Soft, nontender, non-distended  MS: No edema; No deformity.  Catheterization site clean dry and intact Neuro:  Nonfocal  Psych: Normal affect   Labs    Chemistry Recent Labs  Lab 04/11/18 2113 04/12/18 0623  NA 137 139  K 4.0 3.7  CL 103 106  CO2 25 27  GLUCOSE 94 85  BUN 22 20  CREATININE 1.11 1.06  CALCIUM 9.2 8.9  PROT  --  6.3*  ALBUMIN  --  3.7  AST  --  20  ALT  --  14  ALKPHOS  --  72  BILITOT  --  1.3*  GFRNONAA >60 >60  GFRAA >60 >60  ANIONGAP 9 6     Hematology Recent Labs  Lab 04/11/18 2113 04/12/18 0623 04/13/18 0513  WBC 8.1 6.0 6.2  RBC 4.99 4.56 4.82  HGB 14.4 13.3 14.2  HCT 45.1 41.5 43.8  MCV 90.4 91.0 90.9  MCH 28.9 29.2 29.5  MCHC 31.9 32.0 32.4  RDW 14.4 14.3 14.2  PLT 222 184 197    Cardiac Enzymes Recent Labs  Lab 04/12/18 0005 04/12/18 0623 04/12/18 1247  TROPONINI 0.06* 0.06* 0.04*    Recent Labs  Lab 04/11/18 2122  TROPIPOC 0.00     BNP Recent Labs  Lab 04/11/18 2113  BNP 195.0*     DDimer No results for input(s): DDIMER in the last 168 hours.   Radiology    Dg Chest 2 View  Result Date: 04/11/2018 CLINICAL DATA:  Chest pain EXAM: CHEST - 2 VIEW COMPARISON:  CT 04/06/2017 FINDINGS: Rounded masslike area noted in the left hilar/AP window region, not definitively seen on prior study. Heart is mildly enlarged. No effusions. Diffuse interstitial prominence throughout the lungs compatible with chronic interstitial lung disease. IMPRESSION: Large masslike area in the left hilar/AP window region. Recommend further evaluation with chest CT with IV contrast. Cardiomegaly. Chronic interstitial lung disease. Electronically Signed   By: Rolm Baptise M.D.   On: 04/11/2018 22:48   Ct Chest W Contrast  Result Date: 04/12/2018 CLINICAL DATA:  Chest pain. Abnormal chest x-ray with  left perihilar opacity. EXAM: CT CHEST WITH CONTRAST TECHNIQUE: Multidetector CT imaging of the chest was performed during intravenous contrast administration. CONTRAST:  75mL OMNIPAQUE IOHEXOL 300 MG/ML  SOLN COMPARISON:  Chest radiograph earlier this day. High-resolution chest CT 04/06/2017 FINDINGS: Cardiovascular: Aortic atherosclerosis without dissection or aneurysm. There are coronary artery calcifications. Multi chamber cardiomegaly. No pericardial effusion. Mediastinum/Nodes: Multifocal prominent and borderline enlarged mediastinal and hilar lymph nodes as before. Prevascular node measures 14 mm, previously 15 mm. Right infrahilar node measures 15 mm, previously 12 mm. Lower right hilar node measures 12 mm, previously not well-defined in the absence of IV contrast. Mediastinal and hilar nodes are grossly stable. Thyroid gland is unremarkable. The esophagus is nondistended. No hilar mass to account for radiographic findings, small left hilar lymph nodes are similar to prior exam. Lungs/Pleura: Again seen interstitial lung disease with peripheral honeycombing, bronchiectasis, septal thickening and ground-glass opacities. Overall findings are not significantly changed from prior high-resolution chest CT. No evidence of pulmonary mass to account for radiographic finding. Tiny bibasilar calcifications suggesting prior granulomatous disease, unchanged. The trachea and mainstem bronchi are patent. The small right pleural effusion on prior exam has resolved. No evidence of superimposed pulmonary edema or acute airspace disease. Upper Abdomen: No acute findings. Unchanged left adrenal adenoma. Gallstone without pericholecystic inflammation. Musculoskeletal: There are no acute or suspicious osseous abnormalities. Chronic degenerative change in the spine. IMPRESSION: 1. No pulmonary mass or acute findings to account for masslike area on radiograph. Radiographic findings may have been secondary to patient rotation. 2.  Chronic interstitial lung disease, not significantly changed from prior exam. Multiple prominent and borderline enlarged mediastinal or hilar lymph nodes are not significantly changed from prior exam, likely reactive in the setting of interstitial lung disease. No definite acute superimposed abnormality. 3. Cardiomegaly. Aortic Atherosclerosis (ICD10-I70.0). Coronary artery calcifications. Electronically Signed   By: Keith Rake M.D.   On: 04/12/2018 00:06   Dg Shoulder Left  Result Date: 04/11/2018 CLINICAL DATA:  Left chest pain, shoulder pain EXAM: LEFT SHOULDER - 2+ VIEW COMPARISON:  None. FINDINGS: Degenerative changes in the left [facility] and glenohumeral joints with joint space narrowing and spurring. No acute bony abnormality. Specifically, no fracture, subluxation, or dislocation. IMPRESSION: Degenerative changes.  No acute bony abnormality. Electronically Signed   By: Rolm Baptise M.D.   On: 04/11/2018 22:49    Cardiac Studies   Cath 04/12/18:  The left ventricular ejection fraction is 50-55% by visual estimate.  The left ventricular systolic function is normal.  LV end diastolic pressure is mildly elevated. LVEDP 16 mm Hg.  There is no aortic valve stenosis.  Previously placed Dist RCA-1 stent (unknown type) is widely patent.  Dist RCA-2 lesion is 95% stenosed.  RPDA lesion is 80% stenosed.  Prox Cx lesion is 80% stenosed.  Ost 1st Mrg to 1st Mrg lesion is 80% stenosed.  Previously placed Mid LAD-1 stent (unknown type) is widely patent.  Mid LAD-2 lesion is 75% stenosed.  Ost 2nd Diag to 2nd Diag lesion is 75% stenosed.  Dist LAD lesion is 75% stenosed.  Ost 1st Diag lesion is 50% stenosed.  Prox LAD lesion is 25% stenosed.   Multivessel CAD.  Plan for cardiac surgery consult.  He does have diffuse distal LAD disease which is not optimal for LIMA to LAD.  If he is not a candidate for CABG, would plan to bring back for PCI.    Patient Profile     72 y.o. male  with severe multivessel CAD, CABG consultation  Assessment & Plan    Severe multivessel CAD - Dr. Cyndia Bent will be seeing this morning.  CABG consultation. -Statin, aspirin.  Currently on clopidogrel.  This will need to be held if he is felt to be a CABG candidate.  If not, we will continue and pursue PCI.  Essential hypertension - Has been fairly labile.  Continue with current medications.  Hyperlipidemia - I will change to atorvastatin 40 mg for high intensity statin use.  LDL 86 on simvastatin 20.  Non-ST elevation myocardial infarction -Low level troponin elevation 0.6-0.4. - Multivessel CAD, CABG consideration as above.  Interstitial lung disease -Followed by pulmonary as an outpatient.   For questions or updates, please contact [place] Please consult [email or web address] for contact info under        Signed, Candee Furbish, MD  04/13/2018, 10:39 AM

## 2018-04-14 ENCOUNTER — Encounter (HOSPITAL_COMMUNITY)
Admission: EM | Disposition: A | Discharge status: Home / Self Care | Payer: Self-pay | Source: Home / Self Care | Attending: Cardiovascular Disease

## 2018-04-14 DIAGNOSIS — I2510 Atherosclerotic heart disease of native coronary artery without angina pectoris: Secondary | ICD-10-CM

## 2018-04-14 HISTORY — PX: CORONARY BALLOON ANGIOPLASTY: CATH118233

## 2018-04-14 HISTORY — PX: LEFT HEART CATH: CATH118248

## 2018-04-14 HISTORY — PX: CORONARY STENT INTERVENTION: CATH118234

## 2018-04-14 LAB — CBC
HCT: 43.1 % (ref 39.0–52.0)
HEMOGLOBIN: 13.8 g/dL (ref 13.0–17.0)
MCH: 28.9 pg (ref 26.0–34.0)
MCHC: 32 g/dL (ref 30.0–36.0)
MCV: 90.4 fL (ref 80.0–100.0)
NRBC: 0 % (ref 0.0–0.2)
Platelets: 193 10*3/uL (ref 150–400)
RBC: 4.77 MIL/uL (ref 4.22–5.81)
RDW: 14 % (ref 11.5–15.5)
WBC: 8.2 10*3/uL (ref 4.0–10.5)

## 2018-04-14 LAB — BASIC METABOLIC PANEL
Anion gap: 8 (ref 5–15)
BUN: 16 mg/dL (ref 8–23)
CHLORIDE: 106 mmol/L (ref 98–111)
CO2: 26 mmol/L (ref 22–32)
Calcium: 9.2 mg/dL (ref 8.9–10.3)
Creatinine, Ser: 1.22 mg/dL (ref 0.61–1.24)
GFR calc non Af Amer: 58 mL/min — ABNORMAL LOW (ref >60)
Glucose, Bld: 96 mg/dL (ref 70–99)
POTASSIUM: 4 mmol/L (ref 3.5–5.1)
SODIUM: 140 mmol/L (ref 135–145)

## 2018-04-14 LAB — HEPARIN LEVEL (UNFRACTIONATED): Heparin Unfractionated: 0.53 IU/mL (ref 0.30–0.70)

## 2018-04-14 LAB — POCT ACTIVATED CLOTTING TIME
ACTIVATED CLOTTING TIME: 180 s
Activated Clotting Time: 637 seconds
Activated Clotting Time: 246 seconds
Activated Clotting Time: 208 seconds

## 2018-04-14 SURGERY — CORONARY STENT INTERVENTION
Anesthesia: LOCAL

## 2018-04-14 MED ORDER — SODIUM CHLORIDE 0.9% FLUSH: 3.0000 mL | Freq: Two times a day (BID) | INTRAVENOUS | Status: DC

## 2018-04-14 MED ORDER — ANGIOPLASTY BOOK
Freq: Once | Status: AC
  Administered 2018-04-14: 22:00:00
  Filled 2018-04-14: qty 1

## 2018-04-14 MED ORDER — SODIUM CHLORIDE 0.9 % IV SOLN
INTRAVENOUS | Status: DC | PRN
  Administered 2018-04-14 (×2): 1.75 mg/kg/h via INTRAVENOUS

## 2018-04-14 MED ORDER — ASPIRIN 81 MG PO CHEW
81.0000 mg | CHEWABLE_TABLET | Freq: Every day | ORAL | Status: DC
  Administered 2018-04-15: 09:00:00 81 mg via ORAL
  Filled 2018-04-14: qty 1

## 2018-04-14 MED ORDER — SODIUM CHLORIDE 0.9 % IV SOLN
INTRAVENOUS | Status: DC
  Administered 2018-04-14: 23:00:00 via INTRAVENOUS
  Administered 2018-04-14: 150 mL/h via INTRAVENOUS

## 2018-04-14 MED ORDER — LABETALOL HCL 5 MG/ML IV SOLN: 10.0000 mg | INTRAVENOUS | Status: AC | PRN

## 2018-04-14 MED ORDER — SODIUM CHLORIDE 0.9 % IV SOLN
INTRAVENOUS | Status: AC | PRN
  Administered 2018-04-14: 150 mL/h via INTRAVENOUS

## 2018-04-14 MED ORDER — IOHEXOL 350 MG/ML SOLN
INTRAVENOUS | Status: DC | PRN
  Administered 2018-04-14: 195 mL via INTRA_ARTERIAL

## 2018-04-14 MED ORDER — FENTANYL CITRATE (PF) 100 MCG/2ML IJ SOLN
INTRAMUSCULAR | Status: AC
  Filled 2018-04-14: qty 2

## 2018-04-14 MED ORDER — LIDOCAINE HCL (PF) 1 % IJ SOLN
INTRAMUSCULAR | Status: AC
  Filled 2018-04-14: qty 30

## 2018-04-14 MED ORDER — MIDAZOLAM HCL 2 MG/2ML IJ SOLN
INTRAMUSCULAR | Status: AC
  Filled 2018-04-14: qty 2

## 2018-04-14 MED ORDER — ONDANSETRON HCL 4 MG/2ML IJ SOLN: 4.0000 mg | Freq: Four times a day (QID) | INTRAMUSCULAR | Status: DC | PRN

## 2018-04-14 MED ORDER — MIDAZOLAM HCL 2 MG/2ML IJ SOLN
INTRAMUSCULAR | Status: DC | PRN
  Administered 2018-04-14 (×3): 1 mg via INTRAVENOUS
  Administered 2018-04-14: 2 mg via INTRAVENOUS

## 2018-04-14 MED ORDER — TICAGRELOR 90 MG PO TABS
90.0000 mg | ORAL_TABLET | Freq: Two times a day (BID) | ORAL | Status: DC
  Administered 2018-04-14 – 2018-04-15 (×2): 90 mg via ORAL
  Filled 2018-04-14 (×2): qty 1

## 2018-04-14 MED ORDER — HEPARIN (PORCINE) IN NACL 1000-0.9 UT/500ML-% IV SOLN
INTRAVENOUS | Status: DC | PRN
  Administered 2018-04-14: 500 mL

## 2018-04-14 MED ORDER — BIVALIRUDIN TRIFLUOROACETATE 250 MG IV SOLR
INTRAVENOUS | Status: AC
  Filled 2018-04-14: qty 250

## 2018-04-14 MED ORDER — HEPARIN (PORCINE) IN NACL 1000-0.9 UT/500ML-% IV SOLN
INTRAVENOUS | Status: AC
  Filled 2018-04-14: qty 1000

## 2018-04-14 MED ORDER — FENTANYL CITRATE (PF) 100 MCG/2ML IJ SOLN
INTRAMUSCULAR | Status: DC | PRN
  Administered 2018-04-14 (×4): 25 ug via INTRAVENOUS
  Administered 2018-04-14: 50 ug via INTRAVENOUS
  Administered 2018-04-14: 25 ug via INTRAVENOUS

## 2018-04-14 MED ORDER — LIDOCAINE HCL (PF) 1 % IJ SOLN
INTRAMUSCULAR | Status: DC | PRN
  Administered 2018-04-14: 10 mL

## 2018-04-14 MED ORDER — HEART ATTACK BOUNCING BOOK
Freq: Once | Status: AC
  Administered 2018-04-14: 22:00:00
  Filled 2018-04-14: qty 1

## 2018-04-14 MED ORDER — TICAGRELOR 90 MG PO TABS
ORAL_TABLET | ORAL | Status: AC
  Filled 2018-04-14: qty 2

## 2018-04-14 MED ORDER — SODIUM CHLORIDE 0.9 % IV SOLN: 250.0000 mL | INTRAVENOUS | Status: DC | PRN

## 2018-04-14 MED ORDER — HYDRALAZINE HCL 20 MG/ML IJ SOLN: 5.0000 mg | INTRAMUSCULAR | Status: AC | PRN

## 2018-04-14 MED ORDER — ACETAMINOPHEN 325 MG PO TABS: 650.0000 mg | ORAL_TABLET | ORAL | Status: DC | PRN

## 2018-04-14 MED ORDER — BIVALIRUDIN BOLUS VIA INFUSION - CUPID
INTRAVENOUS | Status: DC | PRN
  Administered 2018-04-14: 80.625 mg via INTRAVENOUS

## 2018-04-14 MED ORDER — DIAZEPAM 5 MG PO TABS: 5.0000 mg | ORAL_TABLET | Freq: Four times a day (QID) | ORAL | Status: DC | PRN

## 2018-04-14 MED ORDER — ATORVASTATIN CALCIUM 80 MG PO TABS
80.0000 mg | ORAL_TABLET | Freq: Every day | ORAL | Status: DC
  Administered 2018-04-14: 16:00:00 80 mg via ORAL
  Filled 2018-04-14: qty 1

## 2018-04-14 MED ORDER — SODIUM CHLORIDE 0.9% FLUSH: 3.0000 mL | INTRAVENOUS | Status: DC | PRN

## 2018-04-14 MED ORDER — TICAGRELOR 90 MG PO TABS
ORAL_TABLET | ORAL | Status: DC | PRN
  Administered 2018-04-14: 180 mg via ORAL

## 2018-04-14 MED ORDER — NITROGLYCERIN 1 MG/10 ML FOR IR/CATH LAB
INTRA_ARTERIAL | Status: DC | PRN
  Administered 2018-04-14 (×3): 200 ug via INTRACORONARY

## 2018-04-14 SURGICAL SUPPLY — 24 items
BALLN SAPPHIRE 2.0X10 (BALLOONS) ×2
BALLN SAPPHIRE 2.0X12 (BALLOONS) ×2
BALLN SAPPHIRE ~~LOC~~ 2.75X10 (BALLOONS) ×1 IMPLANT
BALLN SAPPHIRE ~~LOC~~ 3.25X15 (BALLOONS) ×1 IMPLANT
BALLN WOLVERINE 2.00X10 (BALLOONS) ×2
BALLOON SAPPHIRE 2.0X10 (BALLOONS) IMPLANT
BALLOON SAPPHIRE 2.0X12 (BALLOONS) IMPLANT
BALLOON WOLVERINE 2.00X10 (BALLOONS) IMPLANT
CATH LAUNCHER 6FR JR4 (CATHETERS) ×1 IMPLANT
CATH VISTA GUIDE 6FR XBLAD4 (CATHETERS) ×1 IMPLANT
ELECT DEFIB PAD ADLT CADENCE (PAD) ×1 IMPLANT
KIT ENCORE 26 ADVANTAGE (KITS) ×1 IMPLANT
KIT HEART LEFT (KITS) ×2 IMPLANT
PACK CARDIAC CATHETERIZATION (CUSTOM PROCEDURE TRAY) ×2 IMPLANT
PAD PRO RADIOLUCENT 2001M-C (PAD) ×1 IMPLANT
SHEATH PINNACLE 6F 10CM (SHEATH) ×1 IMPLANT
SHEATH PINNACLE 7F 10CM (SHEATH) ×1 IMPLANT
STENT RESOLUTE ONYX 2.5X12 (Permanent Stent) ×1 IMPLANT
STENT RESOLUTE ONYX 3.0X18 (Permanent Stent) ×1 IMPLANT
STENT RESOLUTE ONYX 3.0X22 (Permanent Stent) ×1 IMPLANT
TRANSDUCER W/STOPCOCK (MISCELLANEOUS) ×2 IMPLANT
TUBING CIL FLEX 10 FLL-RA (TUBING) ×2 IMPLANT
WIRE EMERALD 3MM-J .035X150CM (WIRE) ×1 IMPLANT
WIRE PT2 MS 185 (WIRE) ×1 IMPLANT

## 2018-04-14 NOTE — Interval H&P Note (Signed)
Cath Lab Visit (complete for each Cath Lab visit)  Clinical Evaluation Leading to the Procedure:   ACS: Yes.    Non-ACS:    Anginal Classification: CCS IV  Anti-ischemic medical therapy: Maximal Therapy (2 or more classes of medications)  Non-Invasive Test Results: No non-invasive testing performed  Prior CABG: No previous CABG      History and Physical Interval Note:  04/14/2018 9:34 AM  Maurice Munoz  has presented today for surgery, with the diagnosis of cad  The various methods of treatment have been discussed with the patient and family. After consideration of risks, benefits and other options for treatment, the patient has consented to  Procedure(s): CORONARY STENT INTERVENTION (N/A) as a surgical intervention .  The patient's history has been reviewed, patient examined, no change in status, stable for surgery.  I have reviewed the patient's chart and labs.  Questions were answered to the patient's satisfaction.     Shelva Majestic

## 2018-04-14 NOTE — Progress Notes (Signed)
Received patient from cath lab with femostop in place on  right femoral , site assessed , level 1 large bruise but soft,, dome maintenance pressure maintained ,decreased   pressure as per protocol. Hemostasis achieved , dsg applied . Site remain level 1 ,bruised ,soft no hematoma.

## 2018-04-14 NOTE — Plan of Care (Signed)
  Problem: Clinical Measurements: Goal: Diagnostic test results will improve Outcome: Progressing   Problem: Clinical Measurements: Goal: Cardiovascular complication will be avoided Outcome: Progressing

## 2018-04-14 NOTE — Progress Notes (Signed)
[place] for Heparin Indication: chest pain/ACS  Allergies  Allergen Reactions  . Ibuprofen Itching    Motrin brand  . Neosporin [Neomycin-Bacitracin Zn-Polymyx] Swelling  . Penicillins Rash     Has patient had a PCN reaction causing immediate rash, facial/tongue/throat swelling, SOB or lightheadedness with hypotension: No Has patient had a PCN reaction causing severe rash involving mucus membranes or skin necrosis: No Has patient had a PCN reaction that required hospitalization: No Has patient had a PCN reaction occurring within the last 10 years: No If all of the above answers are "NO", then may proceed with Cephalosporin use.     Patient Measurements: Height: 6\' 1"  (185.4 cm) Weight: 237 lb (107.5 kg) IBW/kg (Calculated) : 79.9 Heparin Dosing Weight: 101 kg  Vital Signs: Temp: 98.2 F (36.8 C) (10/25 0451) Temp Source: Oral (10/25 0451) BP: 119/56 (10/25 0451) Pulse Rate: 61 (10/25 0451)  Labs: Recent Labs    04/11/18 2113 04/12/18 0005 04/12/18 0623 04/12/18 1247 04/13/18 0513 04/13/18 1621 04/14/18 0501  HGB 14.4  --  13.3  --  14.2  --  13.8  HCT 45.1  --  41.5  --  43.8  --  43.1  PLT 222  --  184  --  197  --  193  HEPARINUNFRC  --   --   --   --  0.26* 0.28* 0.53  CREATININE 1.11  --  1.06  --   --   --  1.22  TROPONINI  --  0.06* 0.06* 0.04*  --   --   --     Estimated Creatinine Clearance: 72.4 mL/min (by C-G formula based on SCr of 1.22 mg/dL).  Assessment: 72 yo male s/p cath with multivessel CAD  Anticoag: none pta - iv hep for r/o acs HL 0.53 on 1600 units/hr  CV: s/p cath w/ multivessel CAD EF= 50-55%  Renal: SCr 1.22  Heme/Onc: H&H 13.8/43.1, Plt 193   Goal of Therapy:  Heparin level 0.3-0.7 units/ml Monitor platelets by anticoagulation protocol: Yes   Plan:  Continue heparin 1500 units/hr Daily HL CBC Pending PCI  Levester Fresh, PharmD, BCPS, BCCCP Clinical  Pharmacist [phone]  Please check AMION for all [place] numbers  04/14/2018 8:10 AM

## 2018-04-14 NOTE — Progress Notes (Signed)
Pt developed a hematoma after manual pressure and Femstop applied per MD order.  Dr Claiborne Billings in to see pt.

## 2018-04-15 LAB — CBC
HCT: 39.3 % (ref 39.0–52.0)
Hemoglobin: 12.7 g/dL — ABNORMAL LOW (ref 13.0–17.0)
MCH: 29.3 pg (ref 26.0–34.0)
MCHC: 32.3 g/dL (ref 30.0–36.0)
MCV: 90.8 fL (ref 80.0–100.0)
NRBC: 0 % (ref 0.0–0.2)
PLATELETS: 198 10*3/uL (ref 150–400)
RBC: 4.33 MIL/uL (ref 4.22–5.81)
RDW: 14.2 % (ref 11.5–15.5)
WBC: 10.5 10*3/uL (ref 4.0–10.5)

## 2018-04-15 LAB — BASIC METABOLIC PANEL
ANION GAP: 6 (ref 5–15)
BUN: 14 mg/dL (ref 8–23)
CALCIUM: 8.9 mg/dL (ref 8.9–10.3)
CO2: 27 mmol/L (ref 22–32)
CREATININE: 1.12 mg/dL (ref 0.61–1.24)
Chloride: 106 mmol/L (ref 98–111)
Glucose, Bld: 92 mg/dL (ref 70–99)
Potassium: 4.2 mmol/L (ref 3.5–5.1)
Sodium: 139 mmol/L (ref 135–145)

## 2018-04-15 MED ORDER — ATORVASTATIN CALCIUM 80 MG PO TABS: 80.0000 mg | ORAL_TABLET | Freq: Every day | ORAL | 0 refills | Status: DC

## 2018-04-15 MED ORDER — TICAGRELOR 90 MG PO TABS: 90.0000 mg | ORAL_TABLET | Freq: Two times a day (BID) | ORAL | 11 refills | Status: DC

## 2018-04-15 MED ORDER — TICAGRELOR 90 MG PO TABS: 90.0000 mg | ORAL_TABLET | Freq: Two times a day (BID) | ORAL | 0 refills | Status: DC

## 2018-04-15 NOTE — Progress Notes (Addendum)
CARDIAC REHAB PHASE I   PRE:  Rate/Rhythm: 77 sinus rhythm  BP:  Supine:    Sitting: 103/49  Standing:    SaO2: 96% RA   MODE:  Ambulation: 100  Ftt, c/o back pain   POST:  Rate/Rhythem: 77  BP:  Supine:   Sitting: 118/83  Standing:    SaO2: 93% ra   Pt ambulated in hallway x1 assist.  Slow  Slightly unsteady gait.  Pt encouraged to use walker at home for ambulation.  Pt c/o back pain from recent back injury.  Pt returned to bed, call light in reach, wife at bedside. Pt eager to go home. Education completed including risk factor modification, low fat-low cholesterol diet, exercise, and medication compliance. Pt states he previously adjusted plavix dosage due to nosebleed.  Pt instructed to take plavix DAILY as prescribed and discuss concerns with Dr. Claiborne Billings.  Pt oriented to outpatient cardiac rehab.  At pt request, referral will be sent to [facility]. Pt states he has previously been referred by [name] for pulmonary rehab, educated on PR benefits also.   Understanding verbalized  Andi Hence, RN, BSN Cardiac Pulmonary Rehab   [place]  [phone]

## 2018-04-15 NOTE — Progress Notes (Signed)
Progress Note  Patient Name: Maurice Munoz Date of Encounter: 04/15/2018  Primary Cardiologist: Claiborne Billings  Subjective   Status post repeat LHC on 10/25 with 3-lesson intervention as detailed below. Post cath labs stable. Vitals stable. Has ambulated without difficulty.   Inpatient Medications    Scheduled Meds: . amLODipine  5 mg Oral Daily  . aspirin  81 mg Oral Daily  . atorvastatin  80 mg Oral q1800  . cholecalciferol  1,000 Units Oral q morning - 10a  . metoprolol tartrate  50 mg Oral BID  . sodium chloride flush  3 mL Intravenous Q12H  . sodium chloride flush  3 mL Intravenous Q12H  . ticagrelor  90 mg Oral BID  . tiZANidine  4 mg Oral QHS   Continuous Infusions: . sodium chloride 10 mL/hr at 04/13/18 1725  . sodium chloride    . sodium chloride 100 mL/hr at 04/14/18 2300  . sodium chloride     PRN Meds: sodium chloride, sodium chloride, acetaminophen **OR** acetaminophen, acetaminophen, diazepam, nitroGLYCERIN, ondansetron (ZOFRAN) IV, oxyCODONE-acetaminophen **AND** oxyCODONE, sodium chloride flush, sodium chloride flush   Vital Signs    Vitals:   04/14/18 2138 04/14/18 2155 04/15/18 0504 04/15/18 0827  BP: (!) 100/35 (!) 105/41  (!) 103/49  Pulse: 67 65 72 75  Resp: 14  14 16   Temp: (!) 97.5 F (36.4 C)  98.4 F (36.9 C) 97.8 F (36.6 C)  TempSrc: Oral  Oral Oral  SpO2: 94%  97% 96%  Weight:   108 kg   Height:        Intake/Output Summary (Last 24 hours) at 04/15/2018 0848 Last data filed at 04/15/2018 0524 Gross per 24 hour  Intake 3195.99 ml  Output 1000 ml  Net 2195.99 ml   Filed Weights   04/13/18 0457 04/14/18 0300 04/15/18 0504  Weight: 110.6 kg 107.5 kg 108 kg    Telemetry    NSR, 60s to 80s bpm, rare PVC - Personally Reviewed  ECG    NSR, 69 bpm, no acute st/t changes - Personally Reviewed  Physical Exam   GEN: No acute distress.   Neck: No JVD. Cardiac: RRR, no murmurs, rubs, or gallops. Right femoral cath site with  ecchymosis noted. No active bleeding, erythema or bruit.  Respiratory: Clear to auscultation bilaterally.  GI: Soft, nontender, non-distended.   MS: No edema; No deformity. Neuro:  Alert and oriented x 3; Nonfocal.  Psych: Normal affect.  Labs    Chemistry Recent Labs  Lab 04/12/18 0623 04/14/18 0501 04/15/18 0510  NA 139 140 139  K 3.7 4.0 4.2  CL 106 106 106  CO2 27 26 27   GLUCOSE 85 96 92  BUN 20 16 14   CREATININE 1.06 1.22 1.12  CALCIUM 8.9 9.2 8.9  PROT 6.3*  --   --   ALBUMIN 3.7  --   --   AST 20  --   --   ALT 14  --   --   ALKPHOS 72  --   --   BILITOT 1.3*  --   --   GFRNONAA >60 58* >60  GFRAA >60 >60 >60  ANIONGAP 6 8 6      Hematology Recent Labs  Lab 04/13/18 0513 04/14/18 0501 04/15/18 0510  WBC 6.2 8.2 10.5  RBC 4.82 4.77 4.33  HGB 14.2 13.8 12.7*  HCT 43.8 43.1 39.3  MCV 90.9 90.4 90.8  MCH 29.5 28.9 29.3  MCHC 32.4 32.0 32.3  RDW 14.2 14.0  14.2  PLT 197 193 198    Cardiac Enzymes Recent Labs  Lab 04/12/18 0005 04/12/18 0623 04/12/18 1247  TROPONINI 0.06* 0.06* 0.04*    Recent Labs  Lab 04/11/18 2122  TROPIPOC 0.00     BNP Recent Labs  Lab 04/11/18 2113  BNP 195.0*     DDimer No results for input(s): DDIMER in the last 168 hours.   Radiology    No results found.  Cardiac Studies   LHC 04/14/2018: Conclusion     Mid RCA lesion is 65% stenosed.  Previously placed Dist RCA-1 stent (unknown type) is widely patent.  Dist RCA-2 lesion is 99% stenosed.  Ost RPDA to RPDA lesion is 95% stenosed.  Ost 1st Mrg to 1st Mrg lesion is 80% stenosed.  Prox Cx lesion is 80% stenosed.  Prox LAD to Mid LAD lesion is 10% stenosed.  Mid LAD to Dist LAD lesion is 80% stenosed.  Dist LAD lesion is 90% stenosed.  Post intervention, there is a 20% residual stenosis.  Post intervention, there is a 15% residual stenosis.  Post intervention, there is a 0% residual stenosis.  A stent was successfully placed.  Post  intervention, there is a 0% residual stenosis.  Post intervention, there is a 0% residual stenosis.  Mid LAD lesion is 80% stenosed.  A stent was successfully placed.  Post intervention, there is a 0% residual stenosis.  Post intervention, there is a 0% residual stenosis.  A stent was successfully placed.   Successful multilesion/multivessel PCI to the RCA and LAD vessels.  Cutting Balloon/PTCA to the mid PDA vessel of the RCA with a 95% stenosis being reduced to 15%, and Cutting Balloon/PTCA/DES stenting with a 2.5 x 12 mm Resolute Onyx DES stent inserted in the distal RCA immediately proximal to the takeoff of the PDA vessel with stent overlap to the previously placed stent and postdilatation up to 2.75 mm with the 99% stenosis being reduced to 0%.  Three lesion intervention to the LAD with PTCA of the apical 90% LAD stenosis reduced to less than 30%, PTCA/DES stenting with a 3.0 x 22 mm Resolute Onyx DES stent postdilated to 3.25 mm with the 80% stenosis being reduced to 0% in the 80% stenosis just distal to the old LAD stent treated with PTCA/DES stenting with a 3.0 x 18 mm Resolute Onyx DES stent postdilated 3.3 mm with the 80% stenosis being reduced to 0%.  LVEDP preintervention was 15 mmHg.  RECOMMENDATION: The patient will be being maintained on dual antiplatelet therapy probably indefinitely due to significant multi-vessel disease and  intervention.  Increase medical therapy for concomitant CAD.  Patient will be hydrated post procedure.  Consider discharge over the weekend on increased medical therapy.  If recurrent symptoms develop consider staged PCI to his circumflex marginal and AV groove circumflex vessels.  __________  LHC 04/12/2018: Conclusion     The left ventricular ejection fraction is 50-55% by visual estimate.  The left ventricular systolic function is normal.  LV end diastolic pressure is mildly elevated. LVEDP 16 mm Hg.  There is no aortic valve  stenosis.  Previously placed Dist RCA-1 stent (unknown type) is widely patent.  Dist RCA-2 lesion is 95% stenosed.  RPDA lesion is 80% stenosed.  Prox Cx lesion is 80% stenosed.  Ost 1st Mrg to 1st Mrg lesion is 80% stenosed.  Previously placed Mid LAD-1 stent (unknown type) is widely patent.  Mid LAD-2 lesion is 75% stenosed.  Ost 2nd Diag to 2nd Diag lesion  is 75% stenosed.  Dist LAD lesion is 75% stenosed.  Ost 1st Diag lesion is 50% stenosed.  Prox LAD lesion is 25% stenosed.   Multivessel CAD.  Plan for cardiac surgery consult.  He does have diffuse distal LAD disease which is not optimal for LIMA to LAD.  If he is not a candidate for CABG, would plan to bring back for PCI.       Patient Profile     72 y.o. male with history of multivessel CAD, HTN, HLD, and ILD admitted to [facility] with NSTEMI.    Assessment & Plan    1. Severe multi-vessel CAD with a NSTEMI: -Chest pain free -Not a surgical candidate per CVTS (see consult note) -Status post multi-intervention PCI on 10/25 as detailed above -DAPT with ASA and Brilinta, probably indefinitely per interventional cardiology secondary to multi-vessel CAD -For refractory angina, consider staged PCI to the LCx marginal and AV groove LCx -Cardiac rehab -Aggressive secondary prevention   2. HTN: -Blood pressure well controlled -Continue amlodipine and Lopressor  3. HLD: -Goal LDL < 70 -LDL this admission 86 -Changed from simvastatin to Lipitor this admission -Recommend checking fasting lipid panel and LFT in ~ 8 weeks as an outpatient  4. ILD: -Followed by pulmonology as an outpatient  For questions or updates, please contact [place] Please consult [email or web address] for contact info under Cardiology/STEMI.    Signed, Christell Faith, PA-C [place] Pager: [phone] 04/15/2018, 8:48 AM

## 2018-04-15 NOTE — Progress Notes (Signed)
Patient see and discussed with PA Dunn. Admitted with NSTEMI, found to have multivessel disease. In consultation with CT surgery it was decided to proceed with multivessel PCI as opposed to CABG. He is s/p DES to RCA and also DES x 3 to LAD. Medical therapy with ASA 81, atorva 80, metoprolol 50mg  bid, brillinta. Soft bp's hold ACE-I.   We will plan for discharge today.    Carlyle Dolly MD

## 2018-04-15 NOTE — Discharge Instructions (Signed)
Your medications have changed during this admission. Please review your medication list carefully. If there is any difficulty in taking or getting the Brilinta, please contact our office anytime of day or night.    Coronary Artery Disease, Male Coronary artery disease (CAD) is a condition in which the arteries that lead to the heart (coronary arteries) become narrow or blocked. The narrowing or blockage can lead to decreased blood flow to the heart. Prolonged reduced blood flow can cause a heart attack (myocardial infarction or MI). This condition may also be called coronary heart disease. Because CAD is the leading cause of death in men, it is important to understand what causes this condition and how it is treated. What are the causes? CAD is most often caused by atherosclerosis. This is the buildup of fat and cholesterol (plaque) on the inside of the arteries. Over time, the plaque may narrow or block the artery, reducing blood flow to the heart. Plaque can also become weak and break off within a coronary artery and cause a sudden blockage. Other less common causes of CAD include:  An embolism or blood clot in a coronary artery.  A tearing of the artery (spontaneous coronary artery dissection).  An aneurysm.  Inflammation (vasculitis) in the artery wall.  What increases the risk? The following factors may make you more likely to develop this condition:  Age. Men over age 61 are at a greater risk of CAD.  Family history of CAD.  Gender. Men often develop CAD earlier in life than women.  High blood pressure (hypertension).  Diabetes.  High cholesterol levels.  Tobacco use.  Excessive alcohol use.  Lack of exercise.  A diet high in saturated and trans fats, such as fried food and processed meat.  Other possible risk factors include:  High stress levels.  Depression.  Obesity.  Sleep apnea.  What are the signs or symptoms? Many people do not have any symptoms  during the early stages of CAD. As the condition progresses, symptoms may include:  Chest pain (angina). The pain can: ? Feel like a crushing or squeezing, or a tightness, pressure, fullness, or heaviness in the chest. ? Last more than a few minutes or can stop and recur. The pain tends to get worse with exercise or stress and to fade with rest.  Pain in the arms, neck, jaw, or back.  Unexplained heartburn or indigestion.  Shortness of breath.  Nausea or vomiting.  Sudden light-headedness.  Sudden cold sweats.  Fluttering or fast heartbeat (palpitations).  How is this diagnosed? This condition is diagnosed based on:  Your family and medical history.  A physical exam.  Tests, including: ? A test to check the electrical signals in your heart (electrocardiogram). ? Exercise stress test. This looks for signs of blockage when the heart is stressed with exercise, such as running on a treadmill. ? Pharmacologic stress test. This test looks for signs of blockage when the heart is being stressed with a medicine. ? Blood tests. ? Coronary angiogram. This is a procedure to look at the coronary arteries to see if there is any blockage. During this test, a dye is injected into your arteries so they appear on an X-ray. ? A test that uses sound waves to take a picture of your heart (echocardiogram). ? Chest X-ray.  How is this treated? This condition may be treated by:  Healthy lifestyle changes to reduce risk factors.  Medicines such as: ? Antiplatelet medicines and blood-thinning medicines, such as  aspirin. These help to prevent blood clots. ? Nitroglycerin. ? Blood pressure medicines. ? Cholesterol-lowering medicine.  Coronary angioplasty and stenting. During this procedure, a thin, flexible tube is inserted through a blood vessel and into a blocked artery. A balloon or similar device on the end of the tube is inflated to open up the artery. In some cases, a small, mesh tube  (stent) is inserted into the artery to keep it open.  Coronary artery bypass surgery. During this surgery, veins or arteries from other parts of the body are used to create a bypass around the blockage and allow blood to reach your heart.  Follow these instructions at home: Medicines  Take over-the-counter and prescription medicines only as told by your health care provider.  Do not take the following medicines unless your health care provider approves: ? NSAIDs, such as ibuprofen, naproxen, or celecoxib. ? Vitamin supplements that contain vitamin A, vitamin E, or both. Lifestyle  Follow an exercise program approved by your health care provider. Aim for 150 minutes of moderate exercise or 75 minutes of vigorous exercise each week.  Maintain a healthy weight or lose weight as approved by your health care provider.  Rest when you are tired.  Learn to manage stress or try to limit your stress. Ask your health care provider for suggestions if you need help.  Get screened for depression and seek treatment, if needed.  Do not use any products that contain nicotine or tobacco, such as cigarettes and e-cigarettes. If you need help quitting, ask your health care provider.  Do not use illegal drugs. Eating and drinking  Follow a heart-healthy diet. A dietitian can help educate you about healthy food options and changes. In general, eat plenty of fruits and vegetables, lean meats, and whole grains.  Avoid foods high in: ? Sugar. ? Salt (sodium). ? Saturated fat, such as processed or fatty meat. ? Trans fat, such as fried foods.  Use healthy cooking methods such as roasting, grilling, broiling, baking, poaching, steaming, or stir-frying.  If you drink alcohol, and your health care provider approves, limit your alcohol intake to no more than 2 drinks per day. One drink equals 12 ounces of beer, 5 ounces of wine, or 1 ounces of hard liquor. General instructions  Manage any other health  conditions, such as hypertension and diabetes. These conditions affect your heart.  Your health care provider may ask you to monitor your blood pressure. Ideally, your blood pressure should be below 130/80.  Keep all follow-up visits as told by your health care provider. This is important. Get help right away if:  You have pain in your chest, neck, arm, jaw, stomach, or back that: ? Lasts more than a few minutes. ? Is recurring. ? Is not relieved by taking medicine under your tongue (sublingualnitroglycerin).  You have too much (profuse) sweating without cause.  You have unexplained: ? Heartburn or indigestion. ? Shortness of breath or difficulty breathing. ? Fluttering or fast heartbeat (palpitations). ? Nausea or vomiting. ? Fatigue. ? Feelings of nervousness or anxiety. ? Weakness. ? Diarrhea.  You have sudden light-headedness or dizziness.  You faint.  You feel like hurting yourself or think about taking your own life. These symptoms may represent a serious problem that is an emergency. Do not wait to see if the symptoms will go away. Get medical help right away. Call your local emergency services (911 in the [place] Do not drive yourself to the hospital. Summary  Coronary artery disease (CAD)  is a process in which the arteries that lead to the heart (coronary arteries) become narrow or blocked. The narrowing or blockage can lead to a heart attack.  Many people do not have any symptoms during the early stages of CAD. This is called "silent CAD."  CAD can be treated with lifestyle changes, medicines, surgery, or a combination of these treatments. This information is not intended to replace advice given to you by your health care provider. Make sure you discuss any questions you have with your health care provider. Document Released: 01/02/2014 Document Revised: 05/28/2016 Document Reviewed: 05/28/2016 Elsevier Interactive Patient Education  2018 [place] What is cardiac rehabilitation? Cardiac rehabilitation is a treatment program that helps improve the health and well-being of people who have heart problems. Cardiac rehabilitation includes exercise training, education, and counseling to help you get stronger and return to an active lifestyle. This program can help you get better faster and reduce any future hospital stays. Why might I need cardiac rehabilitation?  Cardiac rehabilitation programs can help when you have or have had:  A heart attack.  Heart failure.  Peripheral artery disease.  Coronary artery disease.  Angina.  Lung or breathing problems.  Cardiac rehabilitation programs are also used when you have had:  Coronary artery bypass graft surgery.  Heart valve replacement.  Heart stent placement.  Heart transplant.  Aneurysm repair.  What are the benefits of cardiac rehabilitation? Cardiac rehabilitation can help:  Reduce problems like chest pain and trouble breathing.  Change risk factors that contribute to heart disease, such as: ? Smoking. ? High blood pressure. ? High cholesterol. ? Diabetes. ? Being out of shape or not active. ? Weighing more than 30% higher than your ideal weight. ? Diet.  Improve your mental outlook so you feel: ? More hopeful. ? Better about yourself. ? More confident about taking care of yourself.  Get support from health experts as well as other people with similar problems.  Learn how to manage and understand your medicines.  Teach your family about your condition and how to participate in your recovery.  What happens in cardiac rehabilitation? You will be assessed by a cardiac rehabilitation team. They will check your health history and do a physical exam. You may need blood tests, stress tests, and other evaluations to make sure that you are ready to start cardiac rehabilitation. The cardiac rehabilitation team works with you to make a plan based on your  health and goals. Your program will be tailored to fit you and your needs and may change as you progress. You may work with a health care team that includes:  Doctors.  Nurses.  Dietitians.  Psychologists.  Exercise specialists.  Physical and occupational therapists.  What are the phases of cardiac rehabilitation? A cardiac rehabilitation program is often divided into phases. You advance from one phase to the next. Phase One This phase starts while you are still in the hospital. You may start by walking in your room and then in the hall. You may start some simple exercises with a therapist. Phase Two This phase begins when you go home or to another facility. This phase may last 8-12 weeks. You will travel to a cardiac rehabilitation center or another place where rehabilitation is offered. You will slowly increase your activity level while being closely watched by a nurse or therapist. Exercises may include a combination of strength or resistance training and cardio or aerobic movement on a treadmill  or other machines. Your condition will determine how often and how long these sessions last. In phase two, you may learn how to cook healthy meals, control your blood sugar, and manage your medicines. You may need help with scheduling or planning how and when to take your medicines. If you have questions about your medicines, it is very important that you talk to your health care provider. Phase Three This phase continues for the rest of your life. There will be less supervision. You may still participate in cardiac rehabilitation activities or become part of a group in your community. You may benefit from talking about your experience with other people who are facing similar challenges. Get help right away if:  You have severe chest discomfort, especially if the pain is crushing or pressure-like and spreads to your arms, back, neck, or jaw. Do not wait to see if the pain will go away.  You  have weakness or numbness in your face, arms, or legs, especially on one side of the body.  Your speech is slurred.  You are confused.  You have a sudden severe headache or loss of vision.  You have shortness of breath.  You are sweating and have nausea.  You feel dizzy or faint.  You are fatigued. These symptoms may represent a serious problem that is an emergency. Do not wait to see if the symptoms will go away. Get medical help right away. Call your local emergency services (911 in the [place] Do not drive yourself to the hospital. This information is not intended to replace advice given to you by your health care provider. Make sure you discuss any questions you have with your health care provider. Document Released: 03/16/2008 Document Revised: 05/24/2016 Document Reviewed: 04/21/2015 Elsevier Interactive Patient Education  2018 [employer].

## 2018-04-15 NOTE — Discharge Summary (Signed)
Discharge Summary    Patient ID: Maurice Munoz  MRN: [id number], DOB/AGE: [id number] y.o.  Admit Date: 04/11/2018 Discharge Date: 04/15/2018  Primary Care Provider: Redmond School, MD Primary Cardiologist: Dr. Claiborne Billings, MD  Discharge Diagnoses    Principal Problem:   NSTEMI (non-ST elevated myocardial infarction) [facility]) Active Problems:   CAD in native artery   Essential hypertension   Hyperlipidemia LDL goal <70   Pulmonary fibrosis (HCC)   Allergies Allergies  Allergen Reactions  . Ibuprofen Itching    Motrin brand  . Neosporin [Neomycin-Bacitracin Zn-Polymyx] Swelling  . Penicillins Rash     Has patient had a PCN reaction causing immediate rash, facial/tongue/throat swelling, SOB or lightheadedness with hypotension: No Has patient had a PCN reaction causing severe rash involving mucus membranes or skin necrosis: No Has patient had a PCN reaction that required hospitalization: No Has patient had a PCN reaction occurring within the last 10 years: No If all of the above answers are "NO", then may proceed with Cephalosporin use.      History of Present Illness     72 year old male with history of CAD with prior intervention to the RCA and PDA in 1999, PCI/DES to the LAD and distal RCA in 2007, ILD, HTN, and HLD who was admitted to [facility] with a small NSTEMI.   Prior low-risk NST in 07/2017. He was seen by Dr. Claiborne Billings in 08/2017 and was doing well without recurrent chest pain after undergoing a stress test the month prior. He was planning to undergo surgery for a pilonidal cyst and was advised to hold Plavix 5 days prior to the procedure, which was performed in 09/2017 with no immediate complication.   He presented to [facility] ED on 10/22/ with exertional chest discomfort that began 3 days prior with associated dyspnea. He noted left shoulder pain that had been present since a fall several months prior. SL NTG x 1 was noted to help with his pain. Initial labs at [facility]  showed an I-STAT troponin that was negative with cyclic troponin I being flat trendings at 0.06, EKG NSR, 65 bpm, no acute st/t changes. CXR showed a large mass-like area in the left hilar region with CT chest being advised for follow up and showed no pulmonary mass or acute finding to account for the mass-like area.   Given his concerning symptoms, transfer [facility].   Hospital Course     Consultants: CVTS, pharmacy, cardiac rehab   Patient arrived to [facility] on 10/23 and underwent LHC that showed multi-vessel CAD as detailed below with diffuse disease through the LAD extending into the apex. Initial recommendation for CVTS consult for possible CABG and if not felt to be a CABG candidate he would need repeat LHC with multi-intervention PCI. He was seen by CVTS on 10/24 and it was noted the LAD was not an ideal artery for bypassing due to the diffuse nature of his disease and we would be essentially be bypassing a small segment of the LAD. The left circumflex has noncritical stenoses.  The distal RCA lesion is the most significant and is amenable to PCI. His operative risk for coronary bypass graft surgery would be increased due to his multiple comorbid risk factors including obesity, at least moderate interstitial lung disease with severely reduced diffusion capacity, immobility due to chronic degenerative spine disease, chronic kidney disease, and diffuse coronary disease. It was felt best to undergo revascularization via PCI. Because of  this, he went back to the cath lab on 10/25 and underwent successful cutting balloon/PTCA to the mid RCA with a 95% stenosis being reduced to 15%, cutting balloon/PTCA/DES to the distal RCA immediately proximal to the takeoff of the PDA with stent overlap to the previously placed stent with the 99% stenosis being reduced to 0%. 3-lesion intervention to the LAD with PTCA of the apical 90% LAD stenosis reduced to less than 30%, PTCA/DES to the 80%  stenosis with residual 0% stenosis, the 80% stenosis just distal to the old LAD stent was treated with PTCA/DES with 0% residual stenosis. LVEP 15 mmHg. It was recommended he be maintained on DAPT probably indefinitely due to significant multi-vessel CAD and intervention. If he has recurrent symptoms, consider staged PCI to the LCx marginal and AV groove LCx. Post-procedure he was hydrated. Has has ambulated without difficulty. He was noted to have some soft ecchymosis of the right femoral cath site. Post-cath labs are stable. BP was somewhat soft leading to holding of his torsemide. Unable to add ACEi secondary to relative hypotension in the low 960A systolic.   The patient's cath site has been examined is healing well without issues at this time. The patient has been seen by Dr. Harl Bowie and felt to be stable for discharge today. All follow up appointments have been made. Discharge medications are listed below. Prescriptions have been reviewed with the patient and sent in to their pharmacy.  _____________  Discharge Vitals Blood pressure (!) 103/49, pulse 75, temperature 97.8 F (36.6 C), temperature source Oral, resp. rate 16, height 6\' 1"  (1.854 m), weight 108 kg, SpO2 96 %.  Filed Weights   04/13/18 0457 04/14/18 0300 04/15/18 0504  Weight: 110.6 kg 107.5 kg 108 kg    Labs & Radiologic Studies    CBC Recent Labs    04/14/18 0501 04/15/18 0510  WBC 8.2 10.5  HGB 13.8 12.7*  HCT 43.1 39.3  MCV 90.4 90.8  PLT 193 540   Basic Metabolic Panel Recent Labs    04/14/18 0501 04/15/18 0510  NA 140 139  K 4.0 4.2  CL 106 106  CO2 26 27  GLUCOSE 96 92  BUN 16 14  CREATININE 1.22 1.12  CALCIUM 9.2 8.9   Liver Function Tests No results for input(s): AST, ALT, ALKPHOS, BILITOT, PROT, ALBUMIN in the last 72 hours. No results for input(s): LIPASE, AMYLASE in the last 72 hours. Cardiac Enzymes Recent Labs    04/12/18 1247  TROPONINI 0.04*   BNP Invalid input(s): POCBNP D-Dimer No  results for input(s): DDIMER in the last 72 hours. Hemoglobin A1C No results for input(s): HGBA1C in the last 72 hours. Fasting Lipid Panel Recent Labs    04/13/18 0513  CHOL 146  HDL 33*  LDLCALC 86  TRIG 133  CHOLHDL 4.4   Thyroid Function Tests No results for input(s): TSH, T4TOTAL, T3FREE, THYROIDAB in the last 72 hours.  Invalid input(s): FREET3 _____________  Dg Chest 2 View  Result Date: 04/11/2018 CLINICAL DATA:  Chest pain EXAM: CHEST - 2 VIEW COMPARISON:  CT 04/06/2017 FINDINGS: Rounded masslike area noted in the left hilar/AP window region, not definitively seen on prior study. Heart is mildly enlarged. No effusions. Diffuse interstitial prominence throughout the lungs compatible with chronic interstitial lung disease. IMPRESSION: Large masslike area in the left hilar/AP window region. Recommend further evaluation with chest CT with IV contrast. Cardiomegaly. Chronic interstitial lung disease. Electronically Signed   By: Rolm Baptise M.D.   On:  04/11/2018 22:48   Ct Chest W Contrast  Result Date: 04/12/2018 CLINICAL DATA:  Chest pain. Abnormal chest x-ray with left perihilar opacity. EXAM: CT CHEST WITH CONTRAST TECHNIQUE: Multidetector CT imaging of the chest was performed during intravenous contrast administration. CONTRAST:  78mL OMNIPAQUE IOHEXOL 300 MG/ML  SOLN COMPARISON:  Chest radiograph earlier this day. High-resolution chest CT 04/06/2017 FINDINGS: Cardiovascular: Aortic atherosclerosis without dissection or aneurysm. There are coronary artery calcifications. Multi chamber cardiomegaly. No pericardial effusion. Mediastinum/Nodes: Multifocal prominent and borderline enlarged mediastinal and hilar lymph nodes as before. Prevascular node measures 14 mm, previously 15 mm. Right infrahilar node measures 15 mm, previously 12 mm. Lower right hilar node measures 12 mm, previously not well-defined in the absence of IV contrast. Mediastinal and hilar nodes are grossly stable.  Thyroid gland is unremarkable. The esophagus is nondistended. No hilar mass to account for radiographic findings, small left hilar lymph nodes are similar to prior exam. Lungs/Pleura: Again seen interstitial lung disease with peripheral honeycombing, bronchiectasis, septal thickening and ground-glass opacities. Overall findings are not significantly changed from prior high-resolution chest CT. No evidence of pulmonary mass to account for radiographic finding. Tiny bibasilar calcifications suggesting prior granulomatous disease, unchanged. The trachea and mainstem bronchi are patent. The small right pleural effusion on prior exam has resolved. No evidence of superimposed pulmonary edema or acute airspace disease. Upper Abdomen: No acute findings. Unchanged left adrenal adenoma. Gallstone without pericholecystic inflammation. Musculoskeletal: There are no acute or suspicious osseous abnormalities. Chronic degenerative change in the spine. IMPRESSION: 1. No pulmonary mass or acute findings to account for masslike area on radiograph. Radiographic findings may have been secondary to patient rotation. 2. Chronic interstitial lung disease, not significantly changed from prior exam. Multiple prominent and borderline enlarged mediastinal or hilar lymph nodes are not significantly changed from prior exam, likely reactive in the setting of interstitial lung disease. No definite acute superimposed abnormality. 3. Cardiomegaly. Aortic Atherosclerosis (ICD10-I70.0). Coronary artery calcifications. Electronically Signed   By: Keith Rake M.D.   On: 04/12/2018 00:06   Dg Shoulder Left  Result Date: 04/11/2018 CLINICAL DATA:  Left chest pain, shoulder pain EXAM: LEFT SHOULDER - 2+ VIEW COMPARISON:  None. FINDINGS: Degenerative changes in the left [facility] and glenohumeral joints with joint space narrowing and spurring. No acute bony abnormality. Specifically, no fracture, subluxation, or dislocation. IMPRESSION: Degenerative  changes.  No acute bony abnormality. Electronically Signed   By: Rolm Baptise M.D.   On: 04/11/2018 22:49    Diagnostic Studies/Procedures   LHC 04/14/2018: Conclusion     Mid RCA lesion is 65% stenosed.  Previously placed Dist RCA-1 stent (unknown type) is widely patent.  Dist RCA-2 lesion is 99% stenosed.  Ost RPDA to RPDA lesion is 95% stenosed.  Ost 1st Mrg to 1st Mrg lesion is 80% stenosed.  Prox Cx lesion is 80% stenosed.  Prox LAD to Mid LAD lesion is 10% stenosed.  Mid LAD to Dist LAD lesion is 80% stenosed.  Dist LAD lesion is 90% stenosed.  Post intervention, there is a 20% residual stenosis.  Post intervention, there is a 15% residual stenosis.  Post intervention, there is a 0% residual stenosis.  A stent was successfully placed.  Post intervention, there is a 0% residual stenosis.  Post intervention, there is a 0% residual stenosis.  Mid LAD lesion is 80% stenosed.  A stent was successfully placed.  Post intervention, there is a 0% residual stenosis.  Post intervention, there is a 0% residual stenosis.  A stent  was successfully placed.  Successful multilesion/multivessel PCI to the RCA and LAD vessels.  Cutting Balloon/PTCA to the mid PDA vessel of the RCA with a 95% stenosis being reduced to 15%, and Cutting Balloon/PTCA/DES stenting with a 2.5 x 12 mm Resolute Onyx DES stent inserted in the distal RCA immediately proximal to the takeoff of the PDA vessel with stent overlap to the previously placed stent and postdilatation up to 2.75 mm with the 99% stenosis being reduced to 0%.  Three lesion intervention to the LAD with PTCA of the apical 90% LAD stenosis reduced to less than 30%, PTCA/DES stenting with a 3.0 x 22 mm Resolute Onyx DES stent postdilated to 3.25 mm with the 80% stenosis being reduced to 0% in the 80% stenosis just distal to the old LAD stent treated with PTCA/DES stenting with a 3.0 x 18 mm Resolute Onyx DES stent postdilated 3.3  mm with the 80% stenosis being reduced to 0%.  LVEDP preintervention was 15 mmHg.  RECOMMENDATION: The patient will be being maintained on dual antiplatelet therapy probably indefinitely due to significant multi-vessel disease and intervention. Increase medical therapy for concomitant CAD. Patient will be hydrated post procedure. Consider discharge over the weekend on increased medical therapy. If recurrent symptoms develop consider staged PCI to his circumflex marginal and AV groove circumflex vessels.  __________  LHC 04/12/2018: Conclusion     The left ventricular ejection fraction is 50-55% by visual estimate.  The left ventricular systolic function is normal.  LV end diastolic pressure is mildly elevated. LVEDP 16 mm Hg.  There is no aortic valve stenosis.  Previously placed Dist RCA-1 stent (unknown type) is widely patent.  Dist RCA-2 lesion is 95% stenosed.  RPDA lesion is 80% stenosed.  Prox Cx lesion is 80% stenosed.  Ost 1st Mrg to 1st Mrg lesion is 80% stenosed.  Previously placed Mid LAD-1 stent (unknown type) is widely patent.  Mid LAD-2 lesion is 75% stenosed.  Ost 2nd Diag to 2nd Diag lesion is 75% stenosed.  Dist LAD lesion is 75% stenosed.  Ost 1st Diag lesion is 50% stenosed.  Prox LAD lesion is 25% stenosed.  Multivessel CAD. Plan for cardiac surgery consult. He does have diffuse distal LAD disease which is not optimal for LIMA to LAD. If he is not a candidate for CABG, would plan to bring back for PCI.     _____________  Disposition   Pt is being discharged home today in good condition.  Follow-up Plans & Appointments    Follow-up Information    [facility] Follow up.   Specialty:  Cardiology Why:  a message has been sent to our office to schedule your hospital follow up.  Contact information: [address] [place] [phone]         Discharge Instructions     Amb Referral to Cardiac Rehabilitation   Complete by:  As directed    Diagnosis:   NSTEMI PTCA Coronary Stents     Diet - low sodium heart healthy   Complete by:  As directed    Increase activity slowly   Complete by:  As directed       Discharge Medications   Allergies as of 04/15/2018      Reactions   Ibuprofen Itching   Motrin brand   Neosporin [neomycin-bacitracin Zn-polymyx] Swelling   Penicillins Rash    Has patient had a PCN reaction causing immediate rash, facial/tongue/throat swelling, SOB or lightheadedness with hypotension: No Has  patient had a PCN reaction causing severe rash involving mucus membranes or skin necrosis: No Has patient had a PCN reaction that required hospitalization: No Has patient had a PCN reaction occurring within the last 10 years: No If all of the above answers are "NO", then may proceed with Cephalosporin use.      Medication List    STOP taking these medications   clopidogrel 75 MG tablet Commonly known as:  PLAVIX   lisinopril 5 MG tablet Commonly known as:  PRINIVIL,ZESTRIL   naproxen sodium 220 MG tablet Commonly known as:  ALEVE   simvastatin 20 MG tablet Commonly known as:  ZOCOR   torsemide 20 MG tablet Commonly known as:  DEMADEX     TAKE these medications   allopurinol 300 MG tablet Commonly known as:  ZYLOPRIM Take 300 mg by mouth every morning.   amLODipine 5 MG tablet Commonly known as:  NORVASC Take 1 tablet (5 mg total) by mouth daily.   aspirin EC 81 MG tablet Take 81 mg by mouth daily.   atorvastatin 80 MG tablet Commonly known as:  LIPITOR Take 1 tablet (80 mg total) by mouth daily at 6 PM.   cholecalciferol 1000 units tablet Commonly known as:  VITAMIN D Take 1,000 Units by mouth every morning.   metoprolol tartrate 50 MG tablet Commonly known as:  LOPRESSOR TAKE ONE TABLET BY MOUTH TWICE A DAY   nitroGLYCERIN 0.4 MG SL tablet Commonly known as:  NITROSTAT Place 1 tablet (0.4 mg total) under  the tongue every 5 (five) minutes as needed for chest pain.   oxyCODONE-acetaminophen 10-325 MG tablet Commonly known as:  PERCOCET Take 1 tablet by mouth every 8 (eight) hours as needed for pain.   ticagrelor 90 MG Tabs tablet Commonly known as:  BRILINTA Take 1 tablet (90 mg total) by mouth 2 (two) times daily.   ticagrelor 90 MG Tabs tablet Commonly known as:  BRILINTA Take 1 tablet (90 mg total) by mouth 2 (two) times daily.   tiZANidine 4 MG tablet Commonly known as:  ZANAFLEX Take 4 mg by mouth at bedtime.   vitamin B-12 1000 MCG tablet Commonly known as:  CYANOCOBALAMIN Take 1,000 mcg by mouth daily.        Aspirin prescribed at discharge?  Yes High Intensity Statin Prescribed? (Lipitor 40-80mg  or Crestor 20-40mg ): Yes Beta Blocker Prescribed? Yes For EF <40%, was ACEI/ARB Prescribed? No: EF > 40% ADP Receptor Inhibitor Prescribed? (i.e. Plavix etc.-Includes Medically Managed Patients): Yes For EF <40%, Aldosterone Inhibitor Prescribed? No: EF > 40% Was EF assessed during THIS hospitalization? Yes Was Cardiac Rehab II ordered? (Included Medically managed Patients): Yes   Outstanding Labs/Studies   None.   Duration of Discharge Encounter   Greater than 30 minutes including physician time.  Signed, Rise Mu, PA-C [facility] Pager: [phone] 04/15/2018, 9:45 AM

## 2018-04-17 ENCOUNTER — Encounter (HOSPITAL_COMMUNITY): Payer: Self-pay | Admitting: Cardiovascular Disease

## 2018-04-24 DIAGNOSIS — I219 Acute myocardial infarction, unspecified: Secondary | ICD-10-CM | POA: Diagnosis not present

## 2018-04-24 DIAGNOSIS — Z1389 Encounter for screening for other disorder: Secondary | ICD-10-CM | POA: Diagnosis not present

## 2018-04-24 DIAGNOSIS — Z6832 Body mass index (BMI) 32.0-32.9, adult: Secondary | ICD-10-CM | POA: Diagnosis not present

## 2018-04-24 DIAGNOSIS — I10 Essential (primary) hypertension: Secondary | ICD-10-CM | POA: Diagnosis not present

## 2018-04-24 DIAGNOSIS — E6609 Other obesity due to excess calories: Secondary | ICD-10-CM | POA: Diagnosis not present

## 2018-04-26 DIAGNOSIS — Z23 Encounter for immunization: Secondary | ICD-10-CM | POA: Diagnosis not present

## 2018-04-27 NOTE — Progress Notes (Signed)
Cardiology Office Note   Date:  05/01/2018   ID:  Maurice Munoz, DOB 30-Oct-1945, MRN [id number]  PCP:  Redmond School, MD  Cardiologist:  Dr. Claiborne Billings  Chief Complaint  Patient presents with  . Follow-up  . Coronary Artery Disease    s/p PCI with stents      History of Present Illness: Maurice Munoz is a 72 y.o. male who presents for ongoing assessment and management of CAD with hx of PCI to the RCA and PDA in 1999 PCI with DES to the LAD and distal RCA in 2007, hypertension, hyperlipidemia. He was recently admitted on 03/22/2018 after experiencing chest pain occurring with exertion with associated dyspnea. Troponin was slightly elevated at 0.06 at [facility], CXR revealed large mass-like area in the left hilar region with CT being advised for follow up. CT was negative for mass.   He subsequently was transferred to [facility] hospital and underwent cardiac cath, which revealed multivessel disease through the LAD extending into the apex. CVTS was consulted for CABG and he was not found to be a candidate for CABG  Due to co-morbid conditions and diffuse nature of disease, essentially be bypassing a small segment of the LAD.Marland Kitchen He was recommended for multivessel intervention PCI.   He returned to the cath lab on 04/14/2018 and underwent successful cutting balloon PTCA to the mid RCA, with 95% stenosis being reduced to 15%: PTCA to the distal RCA immediately proximal to the takeoff o the PDA with stent overlap to the previously placed stent with 99% stenosis being reduced to 0%; 3-lesion intervention to the LAD with PTCA of the apical 90% LAD stenosis reduced to less than 30%, PTCA/DES to the 80% stenosis with residual 0% stenosis, the 80% stenosis just distal to the old LAD stent was treated with PTCA/DES with 0% residual stenosis.   If he were to have recurrent symptoms, consideration for staged PCI to the L Cx marginal and AV groove LCx would be completed. There was noted to be mild ecchymosis of the right  groin femoral cath site. On discharge, unable to add ACE due to relative hypotension, in the low 387'F systolic. He was started on Brilinta, and Plavix was discontinued. He was continued on ASA, atorvastatin 80 mg and metoprolol.   He comes today without cardiac complaints. He has not yet started with cardiac rehab as there is a waiting list. He is back fishing and working in his shop. He denies fatigue, bleeding or dyspnea. He is medically compliant    Past Medical History:  Diagnosis Date  . Adrenal adenoma, left    small  . Aortic atherosclerosis ([place])   . Back pain   . Bilateral carotid artery stenosis followed by dr Claiborne Billings   per last carotid duplex [id number]  -- proximal LICA 88-41% ,  RICA 6-60%  . CAD (coronary artery disease)    a. s/p prior intervention to RCA and PDA in 1999 b. DES to LAD and distal RCA in 2007  . Cardiomegaly    Stable  . Chronic constipation   . Chronic pain syndrome   . CKD (chronic kidney disease), stage III ([place])   . DDD (degenerative disc disease), cervical   . DDD (degenerative disc disease), lumbosacral   . Dyspnea   . Dyspnea on exertion   . Gout    per pt last inflared episode 2015 approx.  . Grade II diastolic dysfunction   . H/O epistaxis    per pt sees ENT dr Benjamine Mola for  cauterization (pt takes plavix)  . History of acute pancreatitis 08/08/2014  . History of kidney stones   . HTN (hypertension)   . Hyperlipidemia   . Interstitial lung disease ([place])   . Left arm pain    s/p fall  . Nocturia   . Numbness of right foot    due to DDD lumbar  . Peripheral edema   . Pilonidal cyst   . Pleural effusion on right   . Pulmonary fibrosis ([place])    followed by pcp-- last chest CT 04-08-2016  . S/P drug eluting coronary stent placement 10/11/2005   mLAD and dRCA  . Wears dentures    upper  . Wears glasses     Past Surgical History:  Procedure Laterality Date  . ANTERIOR CERVICAL DECOMP/DISCECTOMY FUSION  12-25-2001     dr Orinda [facility]    C3 -- C7  . ANTERIOR CERVICAL DECOMP/DISCECTOMY FUSION  12-11-2012   dr Carloyn Manner  [facility])  . CARDIOVASCULAR STRESS TEST  08-23-2012    dr Claiborne Billings   normal lexiscan nuclear study w/ no ischemia/  normal LV function and wall motion, ef 65%  . CARDIOVASCULAR STRESS TEST    . CORONARY ANGIOPLASTY  1999   PTCA to RCA & PDA  . CORONARY ANGIOPLASTY WITH STENT PLACEMENT  10-11-2005  dr Shelva Majestic   PCI and stenting to [place] ([place] DES x2)  . CORONARY BALLOON ANGIOPLASTY N/A 04/14/2018   Procedure: CORONARY BALLOON ANGIOPLASTY;  Surgeon: Troy Sine, MD;  Location: [place] CV LAB;  Service: Cardiovascular;  Laterality: N/A;  . CORONARY STENT INTERVENTION N/A 04/14/2018   Procedure: CORONARY STENT INTERVENTION;  Surgeon: Troy Sine, MD;  Location: [place] CV LAB;  Service: Cardiovascular;  Laterality: N/A;  . CYSTOSCOPY W/ URETERAL STENT PLACEMENT Left 02-11-2010     [facility]  . LEFT HEART CATH N/A 04/14/2018   Procedure: Left Heart Cath;  Surgeon: Troy Sine, MD;  Location: [place] CV LAB;  Service: Cardiovascular;  Laterality: N/A;  . LEFT HEART CATH AND CORONARY ANGIOGRAPHY N/A 04/12/2018   Procedure: LEFT HEART CATH AND CORONARY ANGIOGRAPHY;  Surgeon: Jettie Booze, MD;  Location: [place] CV LAB;  Service: Cardiovascular;  Laterality: N/A;  . LUMBAR LAMINECTOMY  2015   L5 -- S1  . PILONIDAL CYST EXCISION  1980s  . PILONIDAL CYST EXCISION N/A 03/31/2017   Procedure: EXCISION OF PILONIDAL DISEASE with Flap Rotation;  Surgeon: Leighton Ruff, MD;  Location: [place];  Service: General;  Laterality: N/A;  . THROAT SURGERY  1996   per pt "removal piece of cryloid(?) that was pressing against throat"  states no issues since  . TRANSTHORACIC ECHOCARDIOGRAM  08-24-2010  dr Claiborne Billings   ef 50-55%/  mild MR/  trivial TR  . WOUND DEBRIDEMENT N/A 10/06/2017   Procedure: DEBRIDEMENT WOUND PLACEMENT OF WOUND HEALING MATRIX;  Surgeon: Leighton Ruff,  MD;  Location: [place];  Service: General;  Laterality: N/A;     Current Outpatient Medications  Medication Sig Dispense Refill  . allopurinol (ZYLOPRIM) 300 MG tablet Take 300 mg by mouth every morning.     Marland Kitchen aspirin EC 81 MG tablet Take 81 mg by mouth daily.    Marland Kitchen atorvastatin (LIPITOR) 80 MG tablet Take 1 tablet (80 mg total) by mouth daily at 6 PM. 30 tablet 0  . cholecalciferol (VITAMIN D) 1000 units tablet Take 1,000 Units by mouth every morning.     Marland Kitchen  metoprolol tartrate (LOPRESSOR) 50 MG tablet TAKE ONE TABLET BY MOUTH TWICE A DAY (Patient taking differently: Take 50 mg by mouth 2 (two) times daily. ) 180 tablet 1  . oxyCODONE-acetaminophen (PERCOCET) 10-325 MG per tablet Take 1 tablet by mouth every 8 (eight) hours as needed for pain.     . ticagrelor (BRILINTA) 90 MG TABS tablet Take 1 tablet (90 mg total) by mouth 2 (two) times daily. 60 tablet 0  . ticagrelor (BRILINTA) 90 MG TABS tablet Take 1 tablet (90 mg total) by mouth 2 (two) times daily. 60 tablet 11  . tiZANidine (ZANAFLEX) 4 MG tablet Take 4 mg by mouth at bedtime.     . vitamin B-12 (CYANOCOBALAMIN) 1000 MCG tablet Take 1,000 mcg by mouth daily.    Marland Kitchen amLODipine (NORVASC) 5 MG tablet Take 1 tablet (5 mg total) by mouth daily. 90 tablet 3  . nitroGLYCERIN (NITROSTAT) 0.4 MG SL tablet Place 1 tablet (0.4 mg total) under the tongue every 5 (five) minutes as needed for chest pain. 25 tablet 3   No current facility-administered medications for this visit.     Allergies:   Ibuprofen; Neosporin [neomycin-bacitracin zn-polymyx]; and Penicillins    Social History:  The patient  reports that he quit smoking about 29 years ago. His smoking use included cigarettes. He has a 75.00 pack-year smoking history. He quit smokeless tobacco use about 29 years ago. He reports that he does not drink alcohol or use drugs.   Family History:  The patient's family history includes Cancer in his father; Diabetes in his brother;  Heart attack in his brother.    ROS: All other systems are reviewed and negative. Unless otherwise mentioned in H&P    PHYSICAL EXAM: VS:  BP 113/67 (BP Location: Left Arm)   Pulse 63   Ht 6' (1.829 m)   Wt 235 lb 3.2 oz (106.7 kg)   SpO2 98%   BMI 31.90 kg/m  , BMI Body mass index is 31.9 kg/m. GEN: Well nourished, well developed, in no acute distress HEENT: normal Neck: no JVD, carotid bruits, or masses Cardiac: RRR; no murmurs, rubs, or gallops,no edema  Respiratory:  Clear to auscultation bilaterally, normal work of breathing GI: soft, nontender, nondistended, + BS MS: no deformity or atrophy Skin: warm and dry, no rash Neuro:  Strength and sensation are intact Psych: euthymic mood, full affect   TMH:DQQIW bradycardia, rate of 58 bpm.   Recent Labs: 04/11/2018: B Natriuretic Peptide 195.0 04/12/2018: ALT 14 04/15/2018: BUN 14; Creatinine, Ser 1.12; Hemoglobin 12.7; Platelets 198; Potassium 4.2; Sodium 139    Lipid Panel    Component Value Date/Time   CHOL 146 04/13/2018 0513   CHOL 144 11/24/2015 1127   TRIG 133 04/13/2018 0513   HDL 33 (L) 04/13/2018 0513   HDL 39 (L) 11/24/2015 1127   CHOLHDL 4.4 04/13/2018 0513   VLDL 27 04/13/2018 0513   LDLCALC 86 04/13/2018 0513   LDLCALC 79 11/24/2015 1127      Wt Readings from Last 3 Encounters:  05/01/18 235 lb 3.2 oz (106.7 kg)  04/15/18 238 lb 1.6 oz (108 kg)  10/06/17 242 lb 11.2 oz (110.1 kg)      Other studies Reviewed: LHC 04/14/2018: Conclusion     Mid RCA lesion is 65% stenosed.  Previously placed Dist RCA-1 stent (unknown type) is widely patent.  Dist RCA-2 lesion is 99% stenosed.  Ost RPDA to RPDA lesion is 95% stenosed.  Ost 1st Mrg to 1st Mrg  lesion is 80% stenosed.  Prox Cx lesion is 80% stenosed.  Prox LAD to Mid LAD lesion is 10% stenosed.  Mid LAD to Dist LAD lesion is 80% stenosed.  Dist LAD lesion is 90% stenosed.  Post intervention, there is a 20% residual  stenosis.  Post intervention, there is a 15% residual stenosis.  Post intervention, there is a 0% residual stenosis.  A stent was successfully placed.  Post intervention, there is a 0% residual stenosis.  Post intervention, there is a 0% residual stenosis.  Mid LAD lesion is 80% stenosed.  A stent was successfully placed.  Post intervention, there is a 0% residual stenosis.  Post intervention, there is a 0% residual stenosis.  A stent was successfully placed.  Successful multilesion/multivessel PCI to the RCA and LAD vessels.  Cutting Balloon/PTCA to the mid PDA vessel of the RCA with a 95% stenosis being reduced to 15%, and Cutting Balloon/PTCA/DES stenting with a 2.5 x 12 mm Resolute Onyx DES stent inserted in the distal RCA immediately proximal to the takeoff of the PDA vessel with stent overlap to the previously placed stent and postdilatation up to 2.75 mm with the 99% stenosis being reduced to 0%.  Three lesion intervention to the LAD with PTCA of the apical 90% LAD stenosis reduced to less than 30%, PTCA/DES stenting with a 3.0 x 22 mm Resolute Onyx DES stent postdilated to 3.25 mm with the 80% stenosis being reduced to 0% in the 80% stenosis just distal to the old LAD stent treated with PTCA/DES stenting with a 3.0 x 18 mm Resolute Onyx DES stent postdilated 3.3 mm with the 80% stenosis being reduced to 0%.  LVEDP preintervention was 15 mmHg.  RECOMMENDATION: The patient will be being maintained on dual antiplatelet therapy probably indefinitely due to significant multi-vessel disease and intervention. Increase medical therapy for concomitant CAD. Patient will be hydrated post procedure. Consider discharge over the weekend on increased medical therapy. If recurrent symptoms develop consider staged PCI to his circumflex marginal and AV groove circumflex vessels.  __________  LHC 04/12/2018: Conclusion     The left ventricular ejection fraction is 50-55% by  visual estimate.  The left ventricular systolic function is normal.  LV end diastolic pressure is mildly elevated. LVEDP 16 mm Hg.  There is no aortic valve stenosis.  Previously placed Dist RCA-1 stent (unknown type) is widely patent.  Dist RCA-2 lesion is 95% stenosed.  RPDA lesion is 80% stenosed.  Prox Cx lesion is 80% stenosed.  Ost 1st Mrg to 1st Mrg lesion is 80% stenosed.  Previously placed Mid LAD-1 stent (unknown type) is widely patent.  Mid LAD-2 lesion is 75% stenosed.  Ost 2nd Diag to 2nd Diag lesion is 75% stenosed.  Dist LAD lesion is 75% stenosed.  Ost 1st Diag lesion is 50% stenosed.  Prox LAD lesion is 25% stenosed.  Multivessel CAD. Plan for cardiac surgery consult. He does have diffuse distal LAD disease which is not optimal for LIMA to LAD. If he is not a candidate for CABG, would plan to bring back for PCI.     ASSESSMENT AND PLAN:  1.  CAD; S/P cardiac cath with LAD disease requiring PCI of the with cutting balloon/PTCA to the mid PDA vessel of the RCA, DES to the distal RCA. Three lesion intervention of the LAD with PTCA of the apical LAD, distal to an existing stent in the mid LAD X 2. He is tolerating Brilinta and ASA without complaints. He is worried  about the cost of the Brilinta. He is waiting on insurance to see how much it covers. I have given him some samples of the 90 mg dose. He will likely need to continue Brilinta at 60 mg BID after one year due to the amount of stents and interventions.   2. Hypertension: BP is well controlled. No changes in his regimen.   3. Hypercholesterolemia: Continue statin therapy, with LDL goal of , 70. Follow up labs in 3 months.   4. Obesity: Weight loss and increased activity is recommended.   Current medicines are reviewed at length with the patient today.    Labs/ tests ordered today include: Lipids and LFT's  Phill Myron. West Pugh, ANP, AACC   05/01/2018 10:01 AM    [place]  [facility] [address] Office [phone] Fax [phone]

## 2018-05-01 ENCOUNTER — Ambulatory Visit (INDEPENDENT_AMBULATORY_CARE_PROVIDER_SITE_OTHER): Payer: Medicare Other | Admitting: Adult Health

## 2018-05-01 ENCOUNTER — Encounter: Payer: Self-pay | Admitting: Adult Health

## 2018-05-01 ENCOUNTER — Ambulatory Visit: Payer: Medicare Other | Admitting: Physician Assistant

## 2018-05-01 VITALS — BP 113/67 | HR 63 | Ht 72.0 in | Wt 235.2 lb

## 2018-05-01 DIAGNOSIS — I200 Unstable angina: Secondary | ICD-10-CM | POA: Diagnosis not present

## 2018-05-01 DIAGNOSIS — Z79899 Other long term (current) drug therapy: Secondary | ICD-10-CM

## 2018-05-01 DIAGNOSIS — I2583 Coronary atherosclerosis due to lipid rich plaque: Secondary | ICD-10-CM | POA: Diagnosis not present

## 2018-05-01 DIAGNOSIS — I10 Essential (primary) hypertension: Secondary | ICD-10-CM | POA: Diagnosis not present

## 2018-05-01 DIAGNOSIS — E7800 Pure hypercholesterolemia, unspecified: Secondary | ICD-10-CM

## 2018-05-01 DIAGNOSIS — I2510 Atherosclerotic heart disease of native coronary artery without angina pectoris: Secondary | ICD-10-CM | POA: Diagnosis not present

## 2018-05-01 NOTE — Patient Instructions (Signed)
Medication Instructions:  NO CHANGES- Your physician recommends that you continue on your current medications as directed. Please refer to the Current Medication list given to you today.  If you need a refill on your cardiac medications before your next appointment, please call your pharmacy.  Labwork: LFT AND LIPID IN 3 MONTHS HERE IN OUR OFFICE AT [facility]  Take the provided lab slips with you to the lab for your blood draw.   You will need to fast. DO NOT EAT OR DRINK PAST MIDNIGHT.   If you have labs (blood work) drawn today and your tests are completely normal, you will receive your results only by: Marland Kitchen MyChart Message (if you have MyChart) OR . A paper copy in the mail If you have any lab test that is abnormal or we need to change your treatment, we will call you to review the results.  Special Instructions: HAVE FASTING LAB DONE 3 DAYS BEFORE 3 MONTH FOLLOW UP  Follow-Up: You will need a follow up appointment in 3 MONTHS.  You may see  DR Tiana Loft, DNP, ANP or one of the following Advanced Practice Providers on your designated Care Team:    . [name], PA-C . Fabian Sharp, PA-C  At [facility], you and your health needs are our priority.  As part of our continuing mission to provide you with exceptional heart care, we have created designated Provider Care Teams.  These Care Teams include your primary Cardiologist (physician) and Advanced Practice Providers (APPs -  Physician Assistants and Nurse Practitioners) who all work together to provide you with the care you need, when you need it.  Thank you for choosing [facility] at [facility]!!

## 2018-05-09 DIAGNOSIS — M4716 Other spondylosis with myelopathy, lumbar region: Secondary | ICD-10-CM | POA: Diagnosis not present

## 2018-05-12 ENCOUNTER — Telehealth: Payer: Self-pay | Admitting: Cardiovascular Disease

## 2018-05-12 ENCOUNTER — Other Ambulatory Visit: Payer: Self-pay | Admitting: Physician Assistant

## 2018-05-12 DIAGNOSIS — I2583 Coronary atherosclerosis due to lipid rich plaque: Secondary | ICD-10-CM

## 2018-05-12 DIAGNOSIS — I2510 Atherosclerotic heart disease of native coronary artery without angina pectoris: Secondary | ICD-10-CM

## 2018-05-12 MED ORDER — ATORVASTATIN CALCIUM 80 MG PO TABS: 80.0000 mg | ORAL_TABLET | Freq: Every day | ORAL | 1 refills | Status: DC

## 2018-05-12 MED ORDER — TICAGRELOR 90 MG PO TABS: 90.0000 mg | ORAL_TABLET | Freq: Two times a day (BID) | ORAL | 6 refills | Status: DC

## 2018-05-12 NOTE — Telephone Encounter (Signed)
Returned call to patient stated he cannot afford Brilinta.Stated he wants to go back to Plavix.Message sent to [facility].

## 2018-05-12 NOTE — Telephone Encounter (Signed)
New Message    *STAT* If patient is at the pharmacy, call can be transferred to refill team.   1. Which medications need to be refilled? (please list name of each medication and dose if known) ticagrelor (BRILINTA) 90 MG TABS tablet Take 1 tablet (90 mg total) by mouth 2 (two) times daily and atorvastatin (LIPITOR) 80 MG tablet atorvastatin (LIPITOR) 80 MG tablet    2. Which pharmacy/location (including street and city if local pharmacy) is medication to be sent to?  [place] [facility]. Do they need a 30 day or 90 day supply? 30   Brilinta 30 day rx request Lipitor 90 day rx request

## 2018-05-12 NOTE — Telephone Encounter (Signed)
New Message   Pt c/o medication issue:  1. Name of Medication: Brilinta  2. How are you currently taking this medication (dosage and times per day)?   3. Are you having a reaction (difficulty breathing--STAT)?   4. What is your medication issue? Patient states that he went to get the rx filled and it was too expensive and he will not be taking it. Please call to discuss.

## 2018-05-12 NOTE — Telephone Encounter (Signed)
Please review for refill, Thanks !  

## 2018-05-14 NOTE — Telephone Encounter (Signed)
He recently required multilesion multivessel intervention and had developed disease on Plavix; Brilinta was started since it is more potent antiplatelet therapy.  If he refuses to take Brilinta will need to check P2Y 2 test after 1 week of Plavix therapy sure that he is X responsive.  However my recommendation would be to continue with Brilinta.

## 2018-05-15 NOTE — Telephone Encounter (Signed)
Returned call to patient Dr.Kelly's recommendation given.Stated he cannot afford Brilinta.He has appox 2 more weeks of Brilinta.He will finish Brilinta then start Plavix 75 mg daily,after 1 week he will go to [facility] hospital and have P2Y12 lab.

## 2018-05-16 ENCOUNTER — Encounter (HOSPITAL_COMMUNITY): Payer: Self-pay

## 2018-05-16 ENCOUNTER — Emergency Department (HOSPITAL_COMMUNITY)
Admission: EM | Admit: 2018-05-16 | Discharge: 2018-05-16 | Disposition: A | Discharge status: Home / Self Care | Payer: Medicare Other | Attending: Emergency Medicine | Admitting: Emergency Medicine

## 2018-05-16 ENCOUNTER — Other Ambulatory Visit: Payer: Self-pay

## 2018-05-16 ENCOUNTER — Emergency Department (HOSPITAL_COMMUNITY): Payer: Medicare Other

## 2018-05-16 DIAGNOSIS — Z87891 Personal history of nicotine dependence: Secondary | ICD-10-CM | POA: Insufficient documentation

## 2018-05-16 DIAGNOSIS — Z7902 Long term (current) use of antithrombotics/antiplatelets: Secondary | ICD-10-CM | POA: Insufficient documentation

## 2018-05-16 DIAGNOSIS — M545 Low back pain, unspecified: Secondary | ICD-10-CM

## 2018-05-16 DIAGNOSIS — N183 Chronic kidney disease, stage 3 (moderate): Secondary | ICD-10-CM | POA: Diagnosis not present

## 2018-05-16 DIAGNOSIS — Z79899 Other long term (current) drug therapy: Secondary | ICD-10-CM | POA: Insufficient documentation

## 2018-05-16 DIAGNOSIS — R1030 Lower abdominal pain, unspecified: Secondary | ICD-10-CM | POA: Diagnosis not present

## 2018-05-16 DIAGNOSIS — N200 Calculus of kidney: Secondary | ICD-10-CM | POA: Diagnosis not present

## 2018-05-16 DIAGNOSIS — I129 Hypertensive chronic kidney disease with stage 1 through stage 4 chronic kidney disease, or unspecified chronic kidney disease: Secondary | ICD-10-CM | POA: Insufficient documentation

## 2018-05-16 DIAGNOSIS — K8020 Calculus of gallbladder without cholecystitis without obstruction: Secondary | ICD-10-CM | POA: Diagnosis not present

## 2018-05-16 DIAGNOSIS — Z7982 Long term (current) use of aspirin: Secondary | ICD-10-CM | POA: Insufficient documentation

## 2018-05-16 DIAGNOSIS — R1032 Left lower quadrant pain: Secondary | ICD-10-CM | POA: Diagnosis present

## 2018-05-16 DIAGNOSIS — I259 Chronic ischemic heart disease, unspecified: Secondary | ICD-10-CM | POA: Insufficient documentation

## 2018-05-16 LAB — URINALYSIS, ROUTINE W REFLEX MICROSCOPIC
Bilirubin Urine: NEGATIVE
Glucose, UA: NEGATIVE mg/dL
Hgb urine dipstick: NEGATIVE
KETONES UR: NEGATIVE mg/dL
LEUKOCYTES UA: NEGATIVE
Nitrite: NEGATIVE
PH: 5 (ref 5.0–8.0)
PROTEIN: NEGATIVE mg/dL
Specific Gravity, Urine: 1.023 (ref 1.005–1.030)

## 2018-05-16 LAB — CBC WITH DIFFERENTIAL/PLATELET
Abs Immature Granulocytes: 0.02 10*3/uL (ref 0.00–0.07)
BASOS ABS: 0.1 10*3/uL (ref 0.0–0.1)
BASOS PCT: 1 %
EOS ABS: 0.1 10*3/uL (ref 0.0–0.5)
Eosinophils Relative: 2 %
HEMATOCRIT: 42.4 % (ref 39.0–52.0)
Hemoglobin: 13.5 g/dL (ref 13.0–17.0)
Immature Granulocytes: 0 %
Lymphocytes Relative: 18 %
Lymphs Abs: 1.5 10*3/uL (ref 0.7–4.0)
MCH: 29 pg (ref 26.0–34.0)
MCHC: 31.8 g/dL (ref 30.0–36.0)
MCV: 91 fL (ref 80.0–100.0)
Monocytes Absolute: 0.6 10*3/uL (ref 0.1–1.0)
Monocytes Relative: 8 %
NRBC: 0 % (ref 0.0–0.2)
Neutro Abs: 5.8 10*3/uL (ref 1.7–7.7)
Neutrophils Relative %: 71 %
PLATELETS: 215 10*3/uL (ref 150–400)
RBC: 4.66 MIL/uL (ref 4.22–5.81)
RDW: 14.1 % (ref 11.5–15.5)
WBC: 8.1 10*3/uL (ref 4.0–10.5)

## 2018-05-16 LAB — BASIC METABOLIC PANEL
ANION GAP: 6 (ref 5–15)
BUN: 17 mg/dL (ref 8–23)
CALCIUM: 9.2 mg/dL (ref 8.9–10.3)
CO2: 26 mmol/L (ref 22–32)
CREATININE: 1.16 mg/dL (ref 0.61–1.24)
Chloride: 107 mmol/L (ref 98–111)
Glucose, Bld: 105 mg/dL — ABNORMAL HIGH (ref 70–99)
Potassium: 4 mmol/L (ref 3.5–5.1)
Sodium: 139 mmol/L (ref 135–145)

## 2018-05-16 MED ORDER — PREDNISONE 10 MG (21) PO TBPK: ORAL_TABLET | ORAL | 0 refills | Status: DC

## 2018-05-16 MED ORDER — SODIUM CHLORIDE 0.9 % IV BOLUS
500.0000 mL | Freq: Once | INTRAVENOUS | Status: AC
  Administered 2018-05-16: 500 mL via INTRAVENOUS

## 2018-05-16 MED ORDER — DEXAMETHASONE SODIUM PHOSPHATE 4 MG/ML IJ SOLN
8.0000 mg | Freq: Once | INTRAMUSCULAR | Status: AC
  Administered 2018-05-16: 8 mg via INTRAVENOUS
  Filled 2018-05-16: qty 2

## 2018-05-16 MED ORDER — MORPHINE SULFATE (PF) 4 MG/ML IV SOLN
4.0000 mg | Freq: Once | INTRAVENOUS | Status: AC
  Administered 2018-05-16: 4 mg via INTRAVENOUS
  Filled 2018-05-16: qty 1

## 2018-05-16 MED ORDER — ONDANSETRON HCL 4 MG/2ML IJ SOLN
4.0000 mg | Freq: Once | INTRAMUSCULAR | Status: AC
  Administered 2018-05-16: 4 mg via INTRAVENOUS
  Filled 2018-05-16: qty 2

## 2018-05-16 NOTE — ED Notes (Signed)
Family at bedside. 

## 2018-05-16 NOTE — ED Triage Notes (Signed)
Pt c/o pain to left flank x 1 week.  Denies n/v/d.  Reports history of kidney stones.

## 2018-05-16 NOTE — ED Provider Notes (Signed)
[facility] EMERGENCY DEPARTMENT Provider Note   CSN: [id number] Arrival date & time: 05/16/18  3500     History   Chief Complaint Chief Complaint  Patient presents with  . Flank Pain    HPI Maurice Munoz is a 72 y.o. male.  Left lower back pain for 1 week without dysuria, hematuria, fever, chills.  Past medical history includes kidney stones, CAD with stents, atherosclerosis, chronic pain, chronic kidney disease, degenerative disc disease, many others.  He takes opiates at home for pain.  Patient is eating normally and ambulatory.  Severity of pain is mild to moderate.  Palpation makes pain worse.     Past Medical History:  Diagnosis Date  . Adrenal adenoma, left    small  . Aortic atherosclerosis ([place])   . Back pain   . Bilateral carotid artery stenosis followed by dr Claiborne Billings   per last carotid duplex [id number]  -- proximal LICA 37-16% ,  RICA 9-67%  . CAD (coronary artery disease)    a. s/p prior intervention to RCA and PDA in 1999 b. DES to LAD and distal RCA in 2007  . Cardiomegaly    Stable  . Chronic constipation   . Chronic pain syndrome   . CKD (chronic kidney disease), stage III ([place])   . DDD (degenerative disc disease), cervical   . DDD (degenerative disc disease), lumbosacral   . Dyspnea   . Dyspnea on exertion   . Gout    per pt last inflared episode 2015 approx.  . Grade II diastolic dysfunction   . H/O epistaxis    per pt sees ENT dr Benjamine Mola for cauterization (pt takes plavix)  . History of acute pancreatitis 08/08/2014  . History of kidney stones   . HTN (hypertension)   . Hyperlipidemia   . Interstitial lung disease ([place])   . Left arm pain    s/p fall  . Nocturia   . Numbness of right foot    due to DDD lumbar  . Peripheral edema   . Pilonidal cyst   . Pleural effusion on right   . Pulmonary fibrosis ([place])    followed by pcp-- last chest CT 04-08-2016  . S/P drug eluting coronary stent placement 10/11/2005   mLAD and dRCA  . Wears  dentures    upper  . Wears glasses     Patient Active Problem List   Diagnosis Date Noted  . NSTEMI (non-ST elevated myocardial infarction) ([place]) 04/13/2018  . CKD (chronic kidney disease) stage 3, GFR 30-59 ml/min ([facility]) 04/12/2018  . Pulmonary fibrosis ([place]) 04/12/2018  . Essential hypertension   . CAD in native artery 04/11/2018  . Hyperlipidemia LDL goal <70 09/25/2014  . Lower extremity edema 09/25/2014  . Nausea   . Upper abdominal pain   . Pancreatitis-Idiopathy acute 2.18.16 admitted [facility] 08/08/2014  . Gout 08/08/2014  . CAD s/p stenting 2007, prior LAD h/o 199, last myoview 3/15 Low risk 11/27/2012  . Edema 11/27/2012  . Obesity (BMI 30-39.9) 11/27/2012  . Cervical disc disease s/p diskecktomy 2003  11/27/2012  . GOUT 10/13/2009  . KNEE PAIN 09/08/2009  . PATELLAR TENDINITIS 09/08/2009    Past Surgical History:  Procedure Laterality Date  . ANTERIOR CERVICAL DECOMP/DISCECTOMY FUSION  12-25-2001     dr Orinda Kenner [facility]   C3 -- C7  . ANTERIOR CERVICAL DECOMP/DISCECTOMY FUSION  12-11-2012   dr Carloyn Manner  [facility])  . CARDIOVASCULAR STRESS TEST  08-23-2012    dr Claiborne Billings  normal lexiscan nuclear study w/ no ischemia/  normal LV function and wall motion, ef 65%  . CARDIOVASCULAR STRESS TEST    . CORONARY ANGIOPLASTY  1999   PTCA to RCA & PDA  . CORONARY ANGIOPLASTY WITH STENT PLACEMENT  10-11-2005  dr Shelva Majestic   PCI and stenting to [place] ([place] DES x2)  . CORONARY BALLOON ANGIOPLASTY N/A 04/14/2018   Procedure: CORONARY BALLOON ANGIOPLASTY;  Surgeon: Troy Sine, MD;  Location: [place] CV LAB;  Service: Cardiovascular;  Laterality: N/A;  . CORONARY STENT INTERVENTION N/A 04/14/2018   Procedure: CORONARY STENT INTERVENTION;  Surgeon: Troy Sine, MD;  Location: [place] CV LAB;  Service: Cardiovascular;  Laterality: N/A;  . CYSTOSCOPY W/ URETERAL STENT PLACEMENT Left 02-11-2010     [facility]  . LEFT HEART CATH N/A 04/14/2018   Procedure: Left Heart  Cath;  Surgeon: Troy Sine, MD;  Location: [place] CV [facility];  Service: Cardiovascular;  Laterality: N/A;  . LEFT HEART CATH AND CORONARY ANGIOGRAPHY N/A 04/12/2018   Procedure: LEFT HEART CATH AND CORONARY ANGIOGRAPHY;  Surgeon: Jettie Booze, MD;  Location: [place] CV LAB;  Service: Cardiovascular;  Laterality: N/A;  . LUMBAR LAMINECTOMY  2015   L5 -- S1  . PILONIDAL CYST EXCISION  1980s  . PILONIDAL CYST EXCISION N/A 03/31/2017   Procedure: EXCISION OF PILONIDAL DISEASE with Flap Rotation;  Surgeon: Leighton Ruff, MD;  Location: [place];  Service: General;  Laterality: N/A;  . THROAT SURGERY  1996   per pt "removal piece of cryloid(?) that was pressing against throat"  states no issues since  . TRANSTHORACIC ECHOCARDIOGRAM  08-24-2010  dr Claiborne Billings   ef 50-55%/  mild MR/  trivial TR  . WOUND DEBRIDEMENT N/A 10/06/2017   Procedure: DEBRIDEMENT WOUND PLACEMENT OF WOUND HEALING MATRIX;  Surgeon: Leighton Ruff, MD;  Location: [place];  Service: General;  Laterality: N/A;        Home Medications    Prior to Admission medications   Medication Sig Start Date End Date Taking? Authorizing Provider  allopurinol (ZYLOPRIM) 300 MG tablet Take 300 mg by mouth every morning.  11/06/12  Yes [provider]  amLODipine (NORVASC) 5 MG tablet Take 1 tablet (5 mg total) by mouth daily. 08/03/17 05/16/18 Yes Troy Sine, MD  aspirin EC 81 MG tablet Take 81 mg by mouth daily.   Yes [provider]  atorvastatin (LIPITOR) 80 MG tablet Take 1 tablet (80 mg total) by mouth daily at 6 PM. 05/12/18  Yes Troy Sine, MD  cholecalciferol (VITAMIN D) 1000 units tablet Take 1,000 Units by mouth every morning.    Yes [provider]  metoprolol tartrate (LOPRESSOR) 50 MG tablet TAKE ONE TABLET BY MOUTH TWICE A DAY Patient taking differently: Take 50 mg by mouth 2 (two) times daily.  03/16/18  Yes Troy Sine, MD  nitroGLYCERIN  (NITROSTAT) 0.4 MG SL tablet Place 1 tablet (0.4 mg total) under the tongue every 5 (five) minutes as needed for chest pain. 08/03/17 05/16/18 Yes Troy Sine, MD  oxyCODONE-acetaminophen (PERCOCET) 10-325 MG per tablet Take 1 tablet by mouth every 8 (eight) hours as needed for pain.  10/11/12  Yes [provider]  ticagrelor (BRILINTA) 90 MG TABS tablet Take 1 tablet (90 mg total) by mouth 2 (two) times daily. 04/15/18  Yes Dunn, Areta Haber, PA-C  tiZANidine (ZANAFLEX) 4 MG tablet Take 4 mg by mouth at  bedtime.  11/08/12  Yes [provider]  vitamin B-12 (CYANOCOBALAMIN) 1000 MCG tablet Take 1,000 mcg by mouth daily.   Yes [provider]  predniSONE (STERAPRED UNI-PAK 21 TAB) 10 MG (21) TBPK tablet 3 tablets for 4 days, 3 tablets for 2 days, 3 tablets for 1 day 05/16/18   Nat Christen, MD    Family History Family History  Problem Relation Age of Onset  . Cancer Father   . Heart attack Brother   . Diabetes Brother   . Colon cancer Neg Hx     Social History Social History   Tobacco Use  . Smoking status: Former Smoker    Packs/day: 3.00    Years: 25.00    Pack years: 75.00    Types: Cigarettes    Last attempt to quit: 11/27/1988    Years since quitting: 29.4  . Smokeless tobacco: Former Systems developer    Quit date: 03/28/1989  Substance Use Topics  . Alcohol use: No  . Drug use: No     Allergies   Ibuprofen; Neosporin [neomycin-bacitracin zn-polymyx]; and Penicillins   Review of Systems Review of Systems  All other systems reviewed and are negative.    Physical Exam Updated Vital Signs BP 122/64 (BP Location: Left Arm)   Pulse 70   Temp (!) 97.5 F (36.4 C) (Oral)   Resp 18   Ht 6' (1.829 m)   Wt 101.2 kg   SpO2 94%   BMI 30.24 kg/m   Physical Exam  Constitutional: He is oriented to person, place, and time. He appears well-developed and well-nourished.  HENT:  Head: Normocephalic and atraumatic.  Eyes: Conjunctivae are normal.  Neck: Neck  supple.  Cardiovascular: Normal rate and regular rhythm.  Pulmonary/Chest: Effort normal and breath sounds normal.  Abdominal: Soft. Bowel sounds are normal.  Musculoskeletal:  Minimal tenderness left lower back  Neurological: He is alert and oriented to person, place, and time.  Skin: Skin is warm and dry.  Psychiatric: He has a normal mood and affect. His behavior is normal.  Nursing note and vitals reviewed.    ED Treatments / Results  Labs (all labs ordered are listed, but only abnormal results are displayed) Labs Reviewed  BASIC METABOLIC PANEL - Abnormal; Notable for the following components:      Result Value   Glucose, Bld 105 (*)    All other components within normal limits  URINALYSIS, ROUTINE W REFLEX MICROSCOPIC  CBC WITH DIFFERENTIAL/PLATELET    EKG None  Radiology Ct Renal Stone Study  Result Date: 05/16/2018 CLINICAL DATA:  Left flank region pain EXAM: CT ABDOMEN AND PELVIS WITHOUT CONTRAST TECHNIQUE: Multidetector CT imaging of the abdomen and pelvis was performed following the standard protocol without oral or IV contrast. COMPARISON:  August 08, 2014 FINDINGS: Lower chest: There is fibrosis throughout the visualized lung base regions. There are foci of coronary artery calcification. Hepatobiliary: No focal liver lesions are evident on this noncontrast enhanced study. A small gallstone is noted in the neck of the gallbladder. There is no gallbladder wall thickening. There is no appreciable biliary duct dilatation. Pancreas: No pancreatic mass or inflammatory focus. Spleen: No splenic lesions are evident. Adrenals/Urinary Tract: Right adrenal appears normal. There is a 1.5 x 1.0 cm left adrenal adenoma. Kidneys bilaterally show no evident mass or hydronephrosis on either side. There is a calculus in the lower pole of the left kidney measuring 7 x 4 mm. No ureteral calculi are evident. Urinary bladder is midline. The urinary  bladder wall thickness is felt to be within  normal limits for nearly empty state of the urinary bladder currently. Stomach/Bowel: There is no appreciable bowel wall or mesenteric thickening. There are scattered sigmoid diverticula without diverticulitis. There is no appreciable bowel wall or mesenteric thickening. No evident bowel obstruction. No free air or portal venous air. Vascular/Lymphatic: There is aortoiliac atherosclerosis. No aneurysm evident. There is calcification in the proximal major mesenteric arterial vessels without major mesenteric arterial vascular obstruction on this noncontrast enhanced study. There is no evident adenopathy in the abdomen or pelvis. Reproductive: There is a small prostatic calculus in the mid prostate region. Prostate and seminal vesicles are normal in size and contour. No evident pelvic mass. Other: Appendix is not appreciable. There is no periappendiceal region inflammatory change. There is no ascites or abscess in the abdomen or pelvis. There is fat in each inguinal ring. Musculoskeletal: There is degenerative change in the lower thoracic and lumbar regions. There is moderate spinal stenosis at L4-5 due to bony hypertrophy laterally and diffuse disc protrusion. There is borderline stenosis due to diffuse disc protrusion and bony hypertrophy at L3-4. There are no blastic or lytic bone lesions. There is no intramuscular or abdominal wall lesions. IMPRESSION: 1. Nonobstructing calculus lower pole right kidney measuring 7 x 4 mm. No ureteral calculus or hydronephrosis on either side. Small prostatic calculus noted. 2. No evident bowel obstruction. Scattered sigmoid diverticula without diverticulitis. No abscess in the abdomen or pelvis. No periappendiceal region inflammatory change. 3. Cholelithiasis. No gallbladder wall thickening evident. No biliary duct dilatation. 4. Aortoiliac and major mesenteric arterial vascular atherosclerosis. Foci of coronary artery calcification. 5. Spinal stenosis at L4-5 and to a lesser  extent at L3-4, multifactorial. 6.  1.5 x 1.0 cm benign left adrenal adenoma. 7.  Bibasilar pulmonary fibrosis. Aortic Atherosclerosis (ICD10-I70.0). Electronically Signed   By: Lowella Grip III M.D.   On: 05/16/2018 09:25    Procedures Procedures (including critical care time)  Medications Ordered in ED Medications  sodium chloride 0.9 % bolus 500 mL (0 mLs Intravenous Stopped 05/16/18 0950)  ondansetron (ZOFRAN) injection 4 mg (4 mg Intravenous Given 05/16/18 0847)  morphine 4 MG/ML injection 4 mg (4 mg Intravenous Given 05/16/18 0847)  morphine 4 MG/ML injection 4 mg (4 mg Intravenous Given 05/16/18 1148)  morphine 4 MG/ML injection 4 mg (4 mg Intravenous Given 05/16/18 1304)  dexamethasone (DECADRON) injection 8 mg (8 mg Intravenous Given 05/16/18 1304)     Initial Impression / Assessment and Plan / ED Course  I have reviewed the triage vital signs and the nursing notes.  Pertinent labs & imaging results that were available during my care of the patient were reviewed by me and considered in my medical decision making (see chart for details).     Patient presents with left lower back pain.  CT renal shows no obvious stone on the left.  No acute abdomen.  Pain management in the ED.  Discharge medication prednisone.  Discussed with patient and his wife  Final Clinical Impressions(s) / ED Diagnoses   Final diagnoses:  Acute left-sided low back pain without sciatica    ED Discharge Orders         Ordered    predniSONE (STERAPRED UNI-PAK 21 TAB) 10 MG (21) TBPK tablet     05/16/18 1350           Nat Christen, MD 05/16/18 1556

## 2018-05-16 NOTE — Discharge Instructions (Addendum)
Take your pain medicine as needed.  Prescription for prednisone.  Follow-up with Dr. Carloyn Manner.

## 2018-06-05 ENCOUNTER — Other Ambulatory Visit (HOSPITAL_COMMUNITY)
Admission: RE | Admit: 2018-06-05 | Discharge: 2018-06-05 | Disposition: A | Discharge status: Home / Self Care | Payer: Medicare Other | Source: Ambulatory Visit | Attending: Cardiovascular Disease | Admitting: Cardiovascular Disease

## 2018-06-05 DIAGNOSIS — I2510 Atherosclerotic heart disease of native coronary artery without angina pectoris: Secondary | ICD-10-CM | POA: Insufficient documentation

## 2018-06-05 DIAGNOSIS — I2583 Coronary atherosclerosis due to lipid rich plaque: Secondary | ICD-10-CM | POA: Diagnosis not present

## 2018-06-05 LAB — PLATELET INHIBITION P2Y12: PLATELET FUNCTION P2Y12: 185 [PRU] — AB (ref 194–418)

## 2018-06-09 ENCOUNTER — Ambulatory Visit (INDEPENDENT_AMBULATORY_CARE_PROVIDER_SITE_OTHER): Payer: Medicare Other | Admitting: Orthopedic Surgery

## 2018-06-09 ENCOUNTER — Encounter: Payer: Self-pay | Admitting: Orthopedic Surgery

## 2018-06-09 VITALS — BP 151/68 | HR 66 | Ht 72.0 in | Wt 240.0 lb

## 2018-06-09 DIAGNOSIS — I200 Unstable angina: Secondary | ICD-10-CM

## 2018-06-09 DIAGNOSIS — M7542 Impingement syndrome of left shoulder: Secondary | ICD-10-CM | POA: Diagnosis not present

## 2018-06-09 NOTE — Progress Notes (Signed)
Progress Note   Patient ID: Maurice Munoz, male   DOB: 1945/09/10, 72 y.o.   MRN: [id number]   Chief Complaint  Patient presents with  . Injections    Left shoulder    72 yo male with CAD, recently had stents placed and can not have surgery until at least 5 months   I saw him in November he had acute onset of pain left shoulder radiating into deltoid with loss of motion.  He went to the emergency room x-rays showed no fracture or dislocation after falling.  He was treated with a cortisone injection and did well until about 3 to 4 weeks ago he started having recurrent pain in the left shoulder no trauma his motion is fairly good but it hurts to move his arm above his head    Review of Systems  Musculoskeletal: Positive for back pain and joint pain.  Neurological: Negative for tingling.     Allergies  Allergen Reactions  . Ibuprofen Itching    Motrin brand  . Neosporin [Neomycin-Bacitracin Zn-Polymyx] Swelling  . Penicillins Rash     Has patient had a PCN reaction causing immediate rash, facial/tongue/throat swelling, SOB or lightheadedness with hypotension: No Has patient had a PCN reaction causing severe rash involving mucus membranes or skin necrosis: No Has patient had a PCN reaction that required hospitalization: No Has patient had a PCN reaction occurring within the last 10 years: No If all of the above answers are "NO", then may proceed with Cephalosporin use.      BP (!) 151/68   Pulse 66   Ht 6' (1.829 m)   Wt 240 lb (108.9 kg)   BMI 32.55 kg/m   Physical Exam Vitals signs reviewed.  Constitutional:      Appearance: He is well-developed.     Comments: Vital signs have been reviewed and are stable. Gen. appearance the patient is well-developed and well-nourished with normal grooming and hygiene.   Musculoskeletal:     Right shoulder: He exhibits normal range of motion, no tenderness, no crepitus and normal strength.     Left shoulder: He exhibits decreased  range of motion, tenderness, pain and decreased strength. He exhibits no deformity, no spasm and normal pulse.       Arms:  Skin:    General: Skin is warm and dry.     Findings: No erythema.  Neurological:     Mental Status: He is alert and oriented to person, place, and time.      Medical decisions:   Data  Imaging:   See history  Encounter Diagnosis  Name Primary?  . Shoulder impingement, left Yes    PLAN:   Inject left shoulder  Follow-up 1 month repeat injection if needed  Procedure note the subacromial injection shoulder left   Verbal consent was obtained to inject the  Left   Shoulder  Timeout was completed to confirm the injection site is a subacromial space of the  left  shoulder  Medication used Depo-Medrol 40 mg and lidocaine 1% 3 cc  Anesthesia was provided by ethyl chloride  The injection was performed in the left  posterior subacromial space. After pinning the skin with alcohol and anesthetized the skin with ethyl chloride the subacromial space was injected using a 20-gauge needle. There were no complications  Sterile dressing was applied.          Arther Abbott, MD 06/09/2018 9:01 AM

## 2018-07-10 ENCOUNTER — Ambulatory Visit: Payer: Medicare Other | Admitting: Orthopedic Surgery

## 2018-07-12 ENCOUNTER — Ambulatory Visit (INDEPENDENT_AMBULATORY_CARE_PROVIDER_SITE_OTHER): Payer: Medicare Other | Admitting: Orthopedic Surgery

## 2018-07-12 ENCOUNTER — Encounter: Payer: Self-pay | Admitting: Orthopedic Surgery

## 2018-07-12 VITALS — BP 126/64 | HR 68 | Ht 72.0 in | Wt 240.0 lb

## 2018-07-12 DIAGNOSIS — M75102 Unspecified rotator cuff tear or rupture of left shoulder, not specified as traumatic: Secondary | ICD-10-CM | POA: Diagnosis not present

## 2018-07-12 DIAGNOSIS — M7542 Impingement syndrome of left shoulder: Secondary | ICD-10-CM

## 2018-07-12 NOTE — Progress Notes (Signed)
Chief Complaint  Patient presents with  . Shoulder Pain    left    [name] presents back with improvement after his left shoulder injection  He is not ready to have any surgery on the shoulder  He does have night pain and pain when he is lying down on the opposite shoulder he gets some relief by lying on the left shoulder  73 yo male with CAD, recently had stents placed and can not have surgery until at least 5 months   I saw him in November he had acute onset of pain left shoulder radiating into deltoid with loss of motion.  He went to the emergency room x-rays showed no fracture or dislocation after falling.  He was treated with a cortisone injection and did well until about 3 to 4 weeks ago he started having recurrent pain in the left shoulder no trauma his motion is fairly good but it hurts to move his arm above his head   Review of Systems  Musculoskeletal: Positive for joint pain and myalgias.  Neurological: Positive for focal weakness. Negative for sensory change.   Physical Exam Vitals signs reviewed.  Constitutional:      Appearance: Normal appearance. He is well-developed.  Musculoskeletal:       Arms:  Skin:    General: Skin is warm and dry.     Findings: No erythema.  Neurological:     Mental Status: He is alert and oriented to person, place, and time.  Psychiatric:        Mood and Affect: Mood and affect normal.     Procedure note the subacromial injection shoulder left   Verbal consent was obtained to inject the  Left   Shoulder  Timeout was completed to confirm the injection site is a subacromial space of the  left  shoulder  Medication used Depo-Medrol 40 mg and lidocaine 1% 3 cc  Anesthesia was provided by ethyl chloride  The injection was performed in the left  posterior subacromial space. After pinning the skin with alcohol and anesthetized the skin with ethyl chloride the subacromial space was injected using a 20-gauge needle. There were no  complications  Sterile dressing was applied.  Encounter Diagnoses  Name Primary?  . Shoulder impingement, left Yes  . Tear of left rotator cuff, unspecified tear extent, unspecified whether traumatic     Fu 6 weeks

## 2018-07-25 DIAGNOSIS — E785 Hyperlipidemia, unspecified: Secondary | ICD-10-CM | POA: Diagnosis not present

## 2018-07-25 DIAGNOSIS — I700 Atherosclerosis of aorta: Secondary | ICD-10-CM | POA: Diagnosis not present

## 2018-07-25 DIAGNOSIS — Z1389 Encounter for screening for other disorder: Secondary | ICD-10-CM | POA: Diagnosis not present

## 2018-07-25 DIAGNOSIS — G894 Chronic pain syndrome: Secondary | ICD-10-CM | POA: Diagnosis not present

## 2018-07-25 DIAGNOSIS — I2510 Atherosclerotic heart disease of native coronary artery without angina pectoris: Secondary | ICD-10-CM | POA: Diagnosis not present

## 2018-07-25 DIAGNOSIS — Z6833 Body mass index (BMI) 33.0-33.9, adult: Secondary | ICD-10-CM | POA: Diagnosis not present

## 2018-07-25 DIAGNOSIS — Z79891 Long term (current) use of opiate analgesic: Secondary | ICD-10-CM | POA: Diagnosis not present

## 2018-07-25 DIAGNOSIS — I200 Unstable angina: Secondary | ICD-10-CM | POA: Diagnosis not present

## 2018-07-25 DIAGNOSIS — J8410 Pulmonary fibrosis, unspecified: Secondary | ICD-10-CM | POA: Diagnosis not present

## 2018-07-25 DIAGNOSIS — N400 Enlarged prostate without lower urinary tract symptoms: Secondary | ICD-10-CM | POA: Diagnosis not present

## 2018-07-25 DIAGNOSIS — R945 Abnormal results of liver function studies: Secondary | ICD-10-CM | POA: Diagnosis not present

## 2018-07-31 ENCOUNTER — Other Ambulatory Visit: Payer: Self-pay | Admitting: Internal Medicine

## 2018-07-31 ENCOUNTER — Other Ambulatory Visit (HOSPITAL_COMMUNITY): Payer: Self-pay | Admitting: Internal Medicine

## 2018-07-31 DIAGNOSIS — R945 Abnormal results of liver function studies: Secondary | ICD-10-CM

## 2018-08-04 ENCOUNTER — Ambulatory Visit (HOSPITAL_COMMUNITY)
Admission: RE | Admit: 2018-08-04 | Discharge: 2018-08-04 | Disposition: A | Discharge status: Home / Self Care | Payer: Medicare Other | Source: Ambulatory Visit | Attending: Internal Medicine | Admitting: Internal Medicine

## 2018-08-04 DIAGNOSIS — R945 Abnormal results of liver function studies: Secondary | ICD-10-CM | POA: Insufficient documentation

## 2018-08-11 DIAGNOSIS — M4716 Other spondylosis with myelopathy, lumbar region: Secondary | ICD-10-CM | POA: Diagnosis not present

## 2018-08-14 ENCOUNTER — Ambulatory Visit: Payer: Medicare Other | Admitting: Physician Assistant

## 2018-08-18 DIAGNOSIS — I214 Non-ST elevation (NSTEMI) myocardial infarction: Secondary | ICD-10-CM | POA: Diagnosis not present

## 2018-08-18 DIAGNOSIS — Z6835 Body mass index (BMI) 35.0-35.9, adult: Secondary | ICD-10-CM | POA: Diagnosis not present

## 2018-08-18 DIAGNOSIS — I200 Unstable angina: Secondary | ICD-10-CM | POA: Diagnosis not present

## 2018-08-18 DIAGNOSIS — J8410 Pulmonary fibrosis, unspecified: Secondary | ICD-10-CM | POA: Diagnosis not present

## 2018-08-18 DIAGNOSIS — I10 Essential (primary) hypertension: Secondary | ICD-10-CM | POA: Diagnosis not present

## 2018-08-18 DIAGNOSIS — G894 Chronic pain syndrome: Secondary | ICD-10-CM | POA: Diagnosis not present

## 2018-08-23 ENCOUNTER — Ambulatory Visit: Payer: Medicare Other | Admitting: Orthopedic Surgery

## 2018-08-23 DIAGNOSIS — Z1211 Encounter for screening for malignant neoplasm of colon: Secondary | ICD-10-CM | POA: Diagnosis not present

## 2018-08-28 ENCOUNTER — Other Ambulatory Visit: Payer: Self-pay

## 2018-08-28 ENCOUNTER — Encounter: Payer: Self-pay | Admitting: Physician Assistant

## 2018-08-28 ENCOUNTER — Ambulatory Visit (INDEPENDENT_AMBULATORY_CARE_PROVIDER_SITE_OTHER): Payer: Medicare Other | Admitting: Physician Assistant

## 2018-08-28 VITALS — BP 90/50 | HR 58 | Ht 72.0 in | Wt 256.0 lb

## 2018-08-28 DIAGNOSIS — I10 Essential (primary) hypertension: Secondary | ICD-10-CM | POA: Diagnosis not present

## 2018-08-28 DIAGNOSIS — J8410 Pulmonary fibrosis, unspecified: Secondary | ICD-10-CM

## 2018-08-28 DIAGNOSIS — Z79899 Other long term (current) drug therapy: Secondary | ICD-10-CM | POA: Diagnosis not present

## 2018-08-28 DIAGNOSIS — N183 Chronic kidney disease, stage 3 unspecified: Secondary | ICD-10-CM

## 2018-08-28 DIAGNOSIS — I2510 Atherosclerotic heart disease of native coronary artery without angina pectoris: Secondary | ICD-10-CM | POA: Diagnosis not present

## 2018-08-28 DIAGNOSIS — I5033 Acute on chronic diastolic (congestive) heart failure: Secondary | ICD-10-CM | POA: Diagnosis not present

## 2018-08-28 DIAGNOSIS — E785 Hyperlipidemia, unspecified: Secondary | ICD-10-CM

## 2018-08-28 MED ORDER — AMLODIPINE BESYLATE 5 MG PO TABS: 2.5000 mg | ORAL_TABLET | Freq: Every day | ORAL | 1 refills | Status: DC

## 2018-08-28 MED ORDER — TORSEMIDE 10 MG PO TABS: 10.0000 mg | ORAL_TABLET | Freq: Every day | ORAL | 1 refills | Status: DC

## 2018-08-28 NOTE — Progress Notes (Signed)
Cardiology Office Note    Date:  08/29/2018   ID:  Maurice Munoz, DOB 01/25/1946, MRN [id number]  PCP:  Redmond School, MD  Cardiologist:  Dr. Claiborne Billings   Chief Complaint  Patient presents with  . Follow-up    seen for Dr. Claiborne Billings    History of Present Illness:  Maurice Munoz is a 73 y.o. male with PMH of CAD, CKD stage III, carotid artery disease, HTN, HLD, and history of pulmonary fibrosis.  Patient had a history of PCI to RCA and PDA in 1999 and a DES to LAD and distal RCA in 2007.  Patient was admitted in October 2019 for chest pain.  Troponin was borderline elevated.  Chest x-ray revealed a large masslike area in the left hilar region however follow-up CT was negative for mass.  He subsequently underwent cardiac catheterization which revealed multivessel disease.  CVTS was consulted for CABG and he was not found to be a candidate for bypass surgery.  He was brought back to the Cath Lab on 04/14/2018 and underwent successful cutting balloon PTCA to the mid RCA and distal RCA, tomorrow DES was placed in the LAD territory.  His 65% mid RCA, 80% proximal left circumflex lesion and 80% ostial OM1 lesion were managed medically.  If he was to have recurrent anginal symptom, we can potentially consider staged PCI to the left circumflex vessel.  His Plavix was discontinued and he was started on Brilinta and along with aspirin.  Echocardiogram obtained on 08/16/2017 showed EF 60 to 65%, grade 2 DD, PA peak pressure 46 mmHg.  Patient was last seen by Bunnie Domino, NP on 05/01/2018, he was doing well at the time.  Brilinta was later transitioned to Plavix due to cost reason.  P2Y12 study obtained on 06/05/2018 showed good platelet inhibition on Plavix.  Recent lipid panel obtained at outside facility on 07/25/2018 showed very good cholesterol control.  Patient presents today for cardiology office visit.  He continues to have some dyspnea with physical exertion at baseline, however denies any obvious  chest discomfort.  He has been compliant with aspirin and Plavix.  Blood pressure on initial arrival was low at 90/50.  However on manual recheck, it was 108/56.  I will decrease his amlodipine to 2.5 mg daily.  On physical exam, he also has at least 2+ pitting edema in bilateral lower extremity.  I recommended restarting his previous torsemide 20 mg daily.  I will obtain 1 week basic metabolic panel to make sure his renal function and electrolyte is okay.  Otherwise he can follow-up with Dr. Claiborne Billings in 4 months.  His lungs is clear on physical exam however does have classic finding of very fine crackles near the base of the lung consistent with history of pulmonary fibrosis.   Past Medical History:  Diagnosis Date  . Adrenal adenoma, left    small  . Aortic atherosclerosis ([place])   . Back pain   . Bilateral carotid artery stenosis followed by dr Claiborne Billings   per last carotid duplex [id number]  -- proximal LICA 92-42% ,  RICA 6-83%  . CAD (coronary artery disease)    a. s/p prior intervention to RCA and PDA in 1999 b. DES to LAD and distal RCA in 2007  . Cardiomegaly    Stable  . Chronic constipation   . Chronic pain syndrome   . CKD (chronic kidney disease), stage III ([place])   . DDD (degenerative disc disease), cervical   . DDD (degenerative disc  disease), lumbosacral   . Dyspnea   . Dyspnea on exertion   . Gout    per pt last inflared episode 2015 approx.  . Grade II diastolic dysfunction   . H/O epistaxis    per pt sees ENT dr Benjamine Mola for cauterization (pt takes plavix)  . History of acute pancreatitis 08/08/2014  . History of kidney stones   . HTN (hypertension)   . Hyperlipidemia   . Interstitial lung disease ([place])   . Left arm pain    s/p fall  . Nocturia   . Numbness of right foot    due to DDD lumbar  . Peripheral edema   . Pilonidal cyst   . Pleural effusion on right   . Pulmonary fibrosis ([place])    followed by pcp-- last chest CT 04-08-2016  . S/P drug eluting coronary stent  placement 10/11/2005   mLAD and dRCA  . Wears dentures    upper  . Wears glasses     Past Surgical History:  Procedure Laterality Date  . ANTERIOR CERVICAL DECOMP/DISCECTOMY FUSION  12-25-2001     dr Orinda Kenner [facility]   C3 -- C7  . ANTERIOR CERVICAL DECOMP/DISCECTOMY FUSION  12-11-2012   dr Carloyn Manner  [facility])  . CARDIOVASCULAR STRESS TEST  08-23-2012    dr Claiborne Billings   normal lexiscan nuclear study w/ no ischemia/  normal LV function and wall motion, ef 65%  . CARDIOVASCULAR STRESS TEST    . CORONARY ANGIOPLASTY  1999   PTCA to RCA & PDA  . CORONARY ANGIOPLASTY WITH STENT PLACEMENT  10-11-2005  dr Shelva Majestic   PCI and stenting to [place] ([place] DES x2)  . CORONARY BALLOON ANGIOPLASTY N/A 04/14/2018   Procedure: CORONARY BALLOON ANGIOPLASTY;  Surgeon: Troy Sine, MD;  Location: [place] [facility];  Service: Cardiovascular;  Laterality: N/A;  . CORONARY STENT INTERVENTION N/A 04/14/2018   Procedure: CORONARY STENT INTERVENTION;  Surgeon: Troy Sine, MD;  Location: [place] [facility];  Service: Cardiovascular;  Laterality: N/A;  . CYSTOSCOPY W/ URETERAL STENT PLACEMENT Left 02-11-2010     [facility]  . LEFT HEART CATH N/A 04/14/2018   Procedure: Left Heart Cath;  Surgeon: Troy Sine, MD;  Location: [place] [facility];  Service: Cardiovascular;  Laterality: N/A;  . LEFT HEART CATH AND CORONARY ANGIOGRAPHY N/A 04/12/2018   Procedure: LEFT HEART CATH AND CORONARY ANGIOGRAPHY;  Surgeon: Jettie Booze, MD;  Location: [place] [facility];  Service: Cardiovascular;  Laterality: N/A;  . LUMBAR LAMINECTOMY  2015   L5 -- S1  . PILONIDAL CYST EXCISION  1980s  . PILONIDAL CYST EXCISION N/A 03/31/2017   Procedure: EXCISION OF PILONIDAL DISEASE with Flap Rotation;  Surgeon: Leighton Ruff, MD;  Location: [place];  Service: General;  Laterality: N/A;  . THROAT SURGERY  1996   per pt "removal piece of cryloid(?) that was pressing against throat"  states no  issues since  . TRANSTHORACIC ECHOCARDIOGRAM  08-24-2010  dr Claiborne Billings   ef 50-55%/  mild MR/  trivial TR  . WOUND DEBRIDEMENT N/A 10/06/2017   Procedure: DEBRIDEMENT WOUND PLACEMENT OF WOUND HEALING MATRIX;  Surgeon: Leighton Ruff, MD;  Location: [place];  Service: General;  Laterality: N/A;    Current Medications: Outpatient Medications Prior to Visit  Medication Sig Dispense Refill  . allopurinol (ZYLOPRIM) 300 MG tablet Take 300 mg by mouth every morning.     Marland Kitchen aspirin EC 81 MG  tablet Take 81 mg by mouth daily.    Marland Kitchen atorvastatin (LIPITOR) 80 MG tablet Take 1 tablet (80 mg total) by mouth daily at 6 PM. 90 tablet 1  . cholecalciferol (VITAMIN D) 1000 units tablet Take 1,000 Units by mouth every morning.     . clopidogrel (PLAVIX) 75 MG tablet Take 75 mg by mouth daily.     . metoprolol tartrate (LOPRESSOR) 50 MG tablet TAKE ONE TABLET BY MOUTH TWICE A DAY (Patient taking differently: Take 50 mg by mouth 2 (two) times daily. ) 180 tablet 1  . nitroGLYCERIN (NITROSTAT) 0.4 MG SL tablet Place 1 tablet (0.4 mg total) under the tongue every 5 (five) minutes as needed for chest pain. 25 tablet 3  . oxyCODONE-acetaminophen (PERCOCET) 10-325 MG per tablet Take 1 tablet by mouth every 8 (eight) hours as needed for pain.     Marland Kitchen tiZANidine (ZANAFLEX) 4 MG tablet Take 4 mg by mouth at bedtime.     . vitamin B-12 (CYANOCOBALAMIN) 1000 MCG tablet Take 1,000 mcg by mouth daily.    Marland Kitchen amLODipine (NORVASC) 5 MG tablet Take 1 tablet (5 mg total) by mouth daily. 90 tablet 3  . ticagrelor (BRILINTA) 90 MG TABS tablet Take 1 tablet (90 mg total) by mouth 2 (two) times daily. (Patient not taking: Reported on 06/09/2018) 60 tablet 11   No facility-administered medications prior to visit.      Allergies:   Ibuprofen; Neosporin [neomycin-bacitracin zn-polymyx]; and Penicillins   Social History   Socioeconomic History  . Marital status: Married    Spouse name: Not on file  . Number of  children: Not on file  . Years of education: Not on file  . Highest education level: Not on file  Occupational History  . Not on file  Social Needs  . Financial resource strain: Not on file  . Food insecurity:    Worry: Not on file    Inability: Not on file  . Transportation needs:    Medical: Not on file    Non-medical: Not on file  Tobacco Use  . Smoking status: Former Smoker    Packs/day: 3.00    Years: 25.00    Pack years: 75.00    Types: Cigarettes    Last attempt to quit: 11/27/1988    Years since quitting: 29.7  . Smokeless tobacco: Former Systems developer    Quit date: 03/28/1989  Substance and Sexual Activity  . Alcohol use: No  . Drug use: No  . Sexual activity: Not on file  Lifestyle  . Physical activity:    Days per week: Not on file    Minutes per session: Not on file  . Stress: Not on file  Relationships  . Social connections:    Talks on phone: Not on file    Gets together: Not on file    Attends religious service: Not on file    Active member of club or organization: Not on file    Attends meetings of clubs or organizations: Not on file    Relationship status: Not on file  Other Topics Concern  . Not on file  Social History Narrative   Previously used used to work on a farm   Currently retired secondary to chronic back pains-   Used to be a Art therapist   Married to his wife and lives in [place]   Father died at age 32 lung cancer   Mother had CAD   Wife had cancer  Family History:  The patient's family history includes Cancer in his father; Diabetes in his brother; Heart attack in his brother.   ROS:   Please see the history of present illness.    ROS All other systems reviewed and are negative.   PHYSICAL EXAM:   VS:  BP (!) 90/50   Pulse (!) 58   Ht 6' (1.829 m)   Wt 256 lb (116.1 kg)   BMI 34.72 kg/m    GEN: Well nourished, well developed, in no acute distress  HEENT: normal  Neck: no JVD, carotid bruits, or  masses Cardiac: RRR; no murmurs, rubs, or gallops. 2+ pitting edema  Respiratory: Fine crackles in bilateral bases GI: soft, nontender, nondistended, + BS MS: no deformity or atrophy  Skin: warm and dry, no rash Neuro:  Alert and Oriented x 3, Strength and sensation are intact Psych: euthymic mood, full affect  Wt Readings from Last 3 Encounters:  08/28/18 256 lb (116.1 kg)  07/12/18 240 lb (108.9 kg)  06/09/18 240 lb (108.9 kg)      Studies/Labs Reviewed:   EKG:  EKG is ordered today.  The ekg ordered today demonstrates sinus bradycardia, no significant ST-T wave changes  Recent Labs: 04/11/2018: B Natriuretic Peptide 195.0 04/12/2018: ALT 14 05/16/2018: BUN 17; Creatinine, Ser 1.16; Hemoglobin 13.5; Platelets 215; Potassium 4.0; Sodium 139   Lipid Panel    Component Value Date/Time   CHOL 146 04/13/2018 0513   CHOL 144 11/24/2015 1127   TRIG 133 04/13/2018 0513   HDL 33 (L) 04/13/2018 0513   HDL 39 (L) 11/24/2015 1127   CHOLHDL 4.4 04/13/2018 0513   VLDL 27 04/13/2018 0513   LDLCALC 86 04/13/2018 0513   LDLCALC 79 11/24/2015 1127    Additional studies/ records that were reviewed today include:   Cath 04/14/2018  Mid RCA lesion is 65% stenosed.  Previously placed Dist RCA-1 stent (unknown type) is widely patent.  Dist RCA-2 lesion is 99% stenosed.  Ost RPDA to RPDA lesion is 95% stenosed.  Ost 1st Mrg to 1st Mrg lesion is 80% stenosed.  Prox Cx lesion is 80% stenosed.  Prox LAD to Mid LAD lesion is 10% stenosed.  Mid LAD to Dist LAD lesion is 80% stenosed.  Dist LAD lesion is 90% stenosed.  Post intervention, there is a 20% residual stenosis.  Post intervention, there is a 15% residual stenosis.  Post intervention, there is a 0% residual stenosis.  A stent was successfully placed.  Post intervention, there is a 0% residual stenosis.  Post intervention, there is a 0% residual stenosis.  Mid LAD lesion is 80% stenosed.  A stent was  successfully placed.  Post intervention, there is a 0% residual stenosis.  Post intervention, there is a 0% residual stenosis.  A stent was successfully placed.   Successful multilesion/multivessel PCI to the RCA and LAD vessels.  Cutting Balloon/PTCA to the mid PDA vessel of the RCA with a 95% stenosis being reduced to 15%, and Cutting Balloon/PTCA/DES stenting with a 2.5 x 12 mm Resolute Onyx DES stent inserted in the distal RCA immediately proximal to the takeoff of the PDA vessel with stent overlap to the previously placed stent and postdilatation up to 2.75 mm with the 99% stenosis being reduced to 0%.  Three lesion intervention to the LAD with PTCA of the apical 90% LAD stenosis reduced to less than 30%, PTCA/DES stenting with a 3.0 x 22 mm Resolute Onyx DES stent postdilated to 3.25 mm with the 80%  stenosis being reduced to 0% in the 80% stenosis just distal to the old LAD stent treated with PTCA/DES stenting with a 3.0 x 18 mm Resolute Onyx DES stent postdilated 3.3 mm with the 80% stenosis being reduced to 0%.  LVEDP preintervention was 15 mmHg.  RECOMMENDATION: The patient will be being maintained on dual antiplatelet therapy probably indefinitely due to significant multi-vessel disease and  intervention.  Increase medical therapy for concomitant CAD.  Patient will be hydrated post procedure.  Consider discharge over the weekend on increased medical therapy.  If recurrent symptoms develop consider staged PCI to his circumflex marginal and AV groove circumflex vessels.    Echo 08/16/2017 LV EF: 60% -   65% Study Conclusions  - Left ventricle: The cavity size was normal. There was mild   concentric hypertrophy. Systolic function was normal. The   estimated ejection fraction was in the range of 60% to 65%. Wall   motion was normal; there were no regional wall motion   abnormalities. Features are consistent with a pseudonormal left   ventricular filling pattern, with  concomitant abnormal relaxation   and increased filling pressure (grade 2 diastolic dysfunction).   Doppler parameters are consistent with high ventricular filling   pressure. - Aortic valve: Transvalvular velocity was within the normal range.   There was no stenosis. There was no regurgitation. - Mitral valve: Transvalvular velocity was within the normal range.   There was no evidence for stenosis. There was no regurgitation. - Right ventricle: The cavity size was normal. Wall thickness was   normal. Systolic function was normal. - Atrial septum: No defect or patent foramen ovale was identified. - Tricuspid valve: There was trivial regurgitation. - Pulmonary arteries: Systolic pressure was mildly increased. PA   peak pressure: 46 mm Hg (S).    ASSESSMENT:    1. Acute on chronic diastolic (congestive) heart failure ([place])   2. Medication management   3. Coronary artery disease involving native coronary artery of native heart without angina pectoris   4. CKD (chronic kidney disease), stage III ([place])   5. Essential hypertension   6. Hyperlipidemia LDL goal <70   7. Pulmonary fibrosis ([place])      PLAN:  In order of problems listed above:  1. Acute on chronic diastolic heart failure: Fortunately his lungs is clear.  Majority of the swelling occurs in the lower extremity.  Restarting torsemide 10 mg daily.  Reassess basic metabolic panel in 1 week.  2. CAD: Denies any recent chest discomfort.  On aspirin and Lipitor.  3. CKD stage III: Obtain basic metabolic panel in 1 week  4. Hypertension: Initial blood pressure reading was low, however on repeat manual check by myself, blood pressure has improved  5. Hyperlipidemia: On Lipitor 80 mg daily.  6. Pulmonary fibrosis: Known history of pulmonary fibrosis.  This likely contributed to some of his baseline dyspnea.    Medication Adjustments/Labs and Tests Ordered: Current medicines are reviewed at length with the patient today.   Concerns regarding medicines are outlined above.  Medication changes, Labs and Tests ordered today are listed in the Patient Instructions below. Patient Instructions  Medication Instructions:  Start Torsemide 10 Mg daily  Decrease Amlodipine to 2.5 Mg daily    If you need a refill on your cardiac medications before your next appointment, please call your pharmacy.   Lab work: You will need to return to the office in 1 week to have your labs (blood work) drawn for a [employer]  If you have labs (blood work) drawn today and your tests are completely normal, you will receive your results only by: Marland Kitchen MyChart Message (if you have MyChart) OR . A paper copy in the mail If you have any lab test that is abnormal or we need to change your treatment, we will call you to review the results.  Testing/Procedures: NONE   Follow-Up: At [facility], you and your health needs are our priority.  As part of our continuing mission to provide you with exceptional heart care, we have created designated Provider Care Teams.  These Care Teams include your primary Cardiologist (physician) and Advanced Practice Providers (APPs -  Physician Assistants and Nurse Practitioners) who all work together to provide you with the care you need, when you need it. You will need a follow up appointment in 4 months to see Shelva Majestic, MD. Any Other Special Instructions Will Be Listed Below (If Applicable).       Hilbert Corrigan, [place]  08/29/2018 8:37 AM    [address], [place], [place]  [address] Phone: [phone]; Fax: [phone]

## 2018-08-28 NOTE — Patient Instructions (Addendum)
Medication Instructions:  Start Torsemide 10 Mg daily  Decrease Amlodipine to 2.5 Mg daily    If you need a refill on your cardiac medications before your next appointment, please call your pharmacy.   Lab work: You will need to return to the office in 1 week to have your labs (blood work) drawn for a BMET  If you have labs (blood work) drawn today and your tests are completely normal, you will receive your results only by: Marland Kitchen MyChart Message (if you have MyChart) OR . A paper copy in the mail If you have any lab test that is abnormal or we need to change your treatment, we will call you to review the results.  Testing/Procedures: NONE   Follow-Up: At [facility], you and your health needs are our priority.  As part of our continuing mission to provide you with exceptional heart care, we have created designated Provider Care Teams.  These Care Teams include your primary Cardiologist (physician) and Advanced Practice Providers (APPs -  Physician Assistants and Nurse Practitioners) who all work together to provide you with the care you need, when you need it. You will need a follow up appointment in 4 months to see Maurice Majestic, MD. Any Other Special Instructions Will Be Listed Below (If Applicable).

## 2018-08-29 ENCOUNTER — Encounter: Payer: Self-pay | Admitting: Physician Assistant

## 2018-09-04 DIAGNOSIS — I10 Essential (primary) hypertension: Secondary | ICD-10-CM | POA: Diagnosis not present

## 2018-09-04 DIAGNOSIS — N183 Chronic kidney disease, stage 3 (moderate): Secondary | ICD-10-CM | POA: Diagnosis not present

## 2018-09-04 DIAGNOSIS — E559 Vitamin D deficiency, unspecified: Secondary | ICD-10-CM | POA: Diagnosis not present

## 2018-09-04 DIAGNOSIS — R809 Proteinuria, unspecified: Secondary | ICD-10-CM | POA: Diagnosis not present

## 2018-09-04 DIAGNOSIS — Z79899 Other long term (current) drug therapy: Secondary | ICD-10-CM | POA: Diagnosis not present

## 2018-09-18 ENCOUNTER — Other Ambulatory Visit (HOSPITAL_COMMUNITY): Payer: Self-pay | Admitting: Cardiovascular Disease

## 2018-09-18 DIAGNOSIS — Z6834 Body mass index (BMI) 34.0-34.9, adult: Secondary | ICD-10-CM | POA: Diagnosis not present

## 2018-09-18 DIAGNOSIS — E6609 Other obesity due to excess calories: Secondary | ICD-10-CM | POA: Diagnosis not present

## 2018-09-18 DIAGNOSIS — G894 Chronic pain syndrome: Secondary | ICD-10-CM | POA: Diagnosis not present

## 2018-09-25 ENCOUNTER — Other Ambulatory Visit (HOSPITAL_COMMUNITY): Payer: Self-pay | Admitting: Cardiovascular Disease

## 2018-09-25 ENCOUNTER — Ambulatory Visit: Payer: Medicare Other | Admitting: Orthopedic Surgery

## 2018-10-16 DIAGNOSIS — M545 Low back pain: Secondary | ICD-10-CM | POA: Diagnosis not present

## 2018-10-16 DIAGNOSIS — M48061 Spinal stenosis, lumbar region without neurogenic claudication: Secondary | ICD-10-CM | POA: Diagnosis not present

## 2018-10-16 DIAGNOSIS — M47816 Spondylosis without myelopathy or radiculopathy, lumbar region: Secondary | ICD-10-CM | POA: Diagnosis not present

## 2018-10-16 DIAGNOSIS — M5416 Radiculopathy, lumbar region: Secondary | ICD-10-CM | POA: Diagnosis not present

## 2018-10-16 NOTE — Progress Notes (Signed)
REVIEWED. MRCP JUN 2016: NL PANCREAS

## 2018-10-17 DIAGNOSIS — M5137 Other intervertebral disc degeneration, lumbosacral region: Secondary | ICD-10-CM | POA: Diagnosis not present

## 2018-10-17 DIAGNOSIS — M48061 Spinal stenosis, lumbar region without neurogenic claudication: Secondary | ICD-10-CM | POA: Diagnosis not present

## 2018-10-17 DIAGNOSIS — G894 Chronic pain syndrome: Secondary | ICD-10-CM | POA: Diagnosis not present

## 2018-10-25 ENCOUNTER — Ambulatory Visit: Payer: Self-pay | Admitting: Orthopedic Surgery

## 2018-11-01 DIAGNOSIS — M5136 Other intervertebral disc degeneration, lumbar region: Secondary | ICD-10-CM | POA: Diagnosis not present

## 2018-11-01 DIAGNOSIS — M47816 Spondylosis without myelopathy or radiculopathy, lumbar region: Secondary | ICD-10-CM | POA: Diagnosis not present

## 2018-11-01 DIAGNOSIS — M5416 Radiculopathy, lumbar region: Secondary | ICD-10-CM | POA: Diagnosis not present

## 2018-11-01 DIAGNOSIS — M545 Low back pain: Secondary | ICD-10-CM | POA: Diagnosis not present

## 2018-11-01 DIAGNOSIS — M48061 Spinal stenosis, lumbar region without neurogenic claudication: Secondary | ICD-10-CM | POA: Diagnosis not present

## 2018-11-15 DIAGNOSIS — G894 Chronic pain syndrome: Secondary | ICD-10-CM | POA: Diagnosis not present

## 2018-11-15 DIAGNOSIS — E6609 Other obesity due to excess calories: Secondary | ICD-10-CM | POA: Diagnosis not present

## 2018-11-15 DIAGNOSIS — Z6835 Body mass index (BMI) 35.0-35.9, adult: Secondary | ICD-10-CM | POA: Diagnosis not present

## 2018-11-15 DIAGNOSIS — M1A00X0 Idiopathic chronic gout, unspecified site, without tophus (tophi): Secondary | ICD-10-CM | POA: Diagnosis not present

## 2018-11-16 ENCOUNTER — Other Ambulatory Visit: Payer: Self-pay | Admitting: Cardiovascular Disease

## 2018-12-19 DIAGNOSIS — M5137 Other intervertebral disc degeneration, lumbosacral region: Secondary | ICD-10-CM | POA: Diagnosis not present

## 2018-12-19 DIAGNOSIS — Z1389 Encounter for screening for other disorder: Secondary | ICD-10-CM | POA: Diagnosis not present

## 2018-12-19 DIAGNOSIS — G894 Chronic pain syndrome: Secondary | ICD-10-CM | POA: Diagnosis not present

## 2018-12-19 DIAGNOSIS — M1A00X0 Idiopathic chronic gout, unspecified site, without tophus (tophi): Secondary | ICD-10-CM | POA: Diagnosis not present

## 2018-12-19 DIAGNOSIS — E6609 Other obesity due to excess calories: Secondary | ICD-10-CM | POA: Diagnosis not present

## 2018-12-19 DIAGNOSIS — Z6835 Body mass index (BMI) 35.0-35.9, adult: Secondary | ICD-10-CM | POA: Diagnosis not present

## 2019-01-18 DIAGNOSIS — E6609 Other obesity due to excess calories: Secondary | ICD-10-CM | POA: Diagnosis not present

## 2019-01-18 DIAGNOSIS — M48061 Spinal stenosis, lumbar region without neurogenic claudication: Secondary | ICD-10-CM | POA: Diagnosis not present

## 2019-01-18 DIAGNOSIS — Z6834 Body mass index (BMI) 34.0-34.9, adult: Secondary | ICD-10-CM | POA: Diagnosis not present

## 2019-01-18 DIAGNOSIS — G894 Chronic pain syndrome: Secondary | ICD-10-CM | POA: Diagnosis not present

## 2019-01-26 DIAGNOSIS — N183 Chronic kidney disease, stage 3 (moderate): Secondary | ICD-10-CM | POA: Diagnosis not present

## 2019-01-26 DIAGNOSIS — Z79899 Other long term (current) drug therapy: Secondary | ICD-10-CM | POA: Diagnosis not present

## 2019-01-26 DIAGNOSIS — I10 Essential (primary) hypertension: Secondary | ICD-10-CM | POA: Diagnosis not present

## 2019-01-26 DIAGNOSIS — E559 Vitamin D deficiency, unspecified: Secondary | ICD-10-CM | POA: Diagnosis not present

## 2019-01-26 DIAGNOSIS — R809 Proteinuria, unspecified: Secondary | ICD-10-CM | POA: Diagnosis not present

## 2019-01-26 DIAGNOSIS — D649 Anemia, unspecified: Secondary | ICD-10-CM | POA: Diagnosis not present

## 2019-02-19 DIAGNOSIS — M5137 Other intervertebral disc degeneration, lumbosacral region: Secondary | ICD-10-CM | POA: Diagnosis not present

## 2019-02-19 DIAGNOSIS — Z6835 Body mass index (BMI) 35.0-35.9, adult: Secondary | ICD-10-CM | POA: Diagnosis not present

## 2019-02-19 DIAGNOSIS — G894 Chronic pain syndrome: Secondary | ICD-10-CM | POA: Diagnosis not present

## 2019-02-19 DIAGNOSIS — L989 Disorder of the skin and subcutaneous tissue, unspecified: Secondary | ICD-10-CM | POA: Diagnosis not present

## 2019-02-27 ENCOUNTER — Other Ambulatory Visit: Payer: Self-pay | Admitting: Physician Assistant

## 2019-03-07 DIAGNOSIS — L570 Actinic keratosis: Secondary | ICD-10-CM | POA: Diagnosis not present

## 2019-03-07 DIAGNOSIS — X32XXXA Exposure to sunlight, initial encounter: Secondary | ICD-10-CM | POA: Diagnosis not present

## 2019-03-07 DIAGNOSIS — D0462 Carcinoma in situ of skin of left upper limb, including shoulder: Secondary | ICD-10-CM | POA: Diagnosis not present

## 2019-03-07 DIAGNOSIS — C44629 Squamous cell carcinoma of skin of left upper limb, including shoulder: Secondary | ICD-10-CM | POA: Diagnosis not present

## 2019-03-19 ENCOUNTER — Ambulatory Visit (INDEPENDENT_AMBULATORY_CARE_PROVIDER_SITE_OTHER): Payer: Medicare Other | Admitting: Orthopedic Surgery

## 2019-03-19 ENCOUNTER — Other Ambulatory Visit: Payer: Self-pay

## 2019-03-19 ENCOUNTER — Ambulatory Visit: Payer: Medicare Other

## 2019-03-19 VITALS — BP 130/74 | HR 73 | Temp 98.8°F | Ht 72.0 in | Wt 259.0 lb

## 2019-03-19 DIAGNOSIS — M7542 Impingement syndrome of left shoulder: Secondary | ICD-10-CM

## 2019-03-19 DIAGNOSIS — I2510 Atherosclerotic heart disease of native coronary artery without angina pectoris: Secondary | ICD-10-CM | POA: Diagnosis not present

## 2019-03-19 DIAGNOSIS — M25551 Pain in right hip: Secondary | ICD-10-CM

## 2019-03-19 NOTE — Progress Notes (Signed)
Maurice Munoz  03/19/2019  HISTORY SECTION :  Chief Complaint  Patient presents with  . Hip Pain    Right hip pain.   HPI The patient presents for evaluation of right hip pain which started over a month ago now has gotten better but it was severe enough to cause him not to walk he took some prednisone seem to get better currently not having pain  Pain was over the right greater trochanter and right groin and he noted pain with range of motion of the hip   Review of Systems  Respiratory: Positive for shortness of breath.   Musculoskeletal: Positive for back pain, falls and joint pain.  Neurological: Positive for sensory change and focal weakness.     has a past medical history of Adrenal adenoma, left, Aortic atherosclerosis ([place]), Back pain, Bilateral carotid artery stenosis (followed by dr Claiborne Billings), CAD (coronary artery disease), Cardiomegaly, Chronic constipation, Chronic pain syndrome, CKD (chronic kidney disease), stage III ([place]), DDD (degenerative disc disease), cervical, DDD (degenerative disc disease), lumbosacral, Dyspnea, Dyspnea on exertion, Gout, Grade II diastolic dysfunction, H/O epistaxis, History of acute pancreatitis (08/08/2014), History of kidney stones, HTN (hypertension), Hyperlipidemia, Interstitial lung disease ([place]), Left arm pain, Nocturia, Numbness of right foot, Peripheral edema, Pilonidal cyst, Pleural effusion on right, Pulmonary fibrosis ([place]), S/P drug eluting coronary stent placement (10/11/2005), Wears dentures, and Wears glasses.   Past Surgical History:  Procedure Laterality Date  . ANTERIOR CERVICAL DECOMP/DISCECTOMY FUSION  12-25-2001     dr Orinda Kenner [facility]   C3 -- C7  . ANTERIOR CERVICAL DECOMP/DISCECTOMY FUSION  12-11-2012   dr Carloyn Manner  [facility])  . CARDIOVASCULAR STRESS TEST  08-23-2012    dr Claiborne Billings   normal lexiscan nuclear study w/ no ischemia/  normal LV function and wall motion, ef 65%  . CARDIOVASCULAR STRESS TEST    . CORONARY  ANGIOPLASTY  1999   PTCA to RCA & PDA  . CORONARY ANGIOPLASTY WITH STENT PLACEMENT  10-11-2005  dr Shelva Majestic   PCI and stenting to [place] (Cypher DES x2)  . CORONARY BALLOON ANGIOPLASTY N/A 04/14/2018   Procedure: CORONARY BALLOON ANGIOPLASTY;  Surgeon: Troy Sine, MD;  Location: [place] [facility];  Service: Cardiovascular;  Laterality: N/A;  . CORONARY STENT INTERVENTION N/A 04/14/2018   Procedure: CORONARY STENT INTERVENTION;  Surgeon: Troy Sine, MD;  Location: [place] [facility];  Service: Cardiovascular;  Laterality: N/A;  . CYSTOSCOPY W/ URETERAL STENT PLACEMENT Left 02-11-2010     [facility]  . LEFT HEART CATH N/A 04/14/2018   Procedure: Left Heart Cath;  Surgeon: Troy Sine, MD;  Location: [place] [facility];  Service: Cardiovascular;  Laterality: N/A;  . LEFT HEART CATH AND CORONARY ANGIOGRAPHY N/A 04/12/2018   Procedure: LEFT HEART CATH AND CORONARY ANGIOGRAPHY;  Surgeon: Jettie Booze, MD;  Location: [place] [facility];  Service: Cardiovascular;  Laterality: N/A;  . LUMBAR LAMINECTOMY  2015   L5 -- S1  . PILONIDAL CYST EXCISION  1980s  . PILONIDAL CYST EXCISION N/A 03/31/2017   Procedure: EXCISION OF PILONIDAL DISEASE with Flap Rotation;  Surgeon: Leighton Ruff, MD;  Location: [place];  Service: General;  Laterality: N/A;  . THROAT SURGERY  1996   per pt "removal piece of cryloid(?) that was pressing against throat"  states no issues since  . TRANSTHORACIC ECHOCARDIOGRAM  08-24-2010  dr Claiborne Billings   ef 50-55%/  mild MR/  trivial TR  . WOUND DEBRIDEMENT  N/A 10/06/2017   Procedure: DEBRIDEMENT WOUND PLACEMENT OF WOUND HEALING MATRIX;  Surgeon: Leighton Ruff, MD;  Location: [place];  Service: General;  Laterality: N/A;    Body mass index is 35.13 kg/m.   Allergies  Allergen Reactions  . Ibuprofen Itching    Motrin brand  . Neosporin [Neomycin-Bacitracin Zn-Polymyx] Swelling  . Penicillins Rash     Has patient had  a PCN reaction causing immediate rash, facial/tongue/throat swelling, SOB or lightheadedness with hypotension: No Has patient had a PCN reaction causing severe rash involving mucus membranes or skin necrosis: No Has patient had a PCN reaction that required hospitalization: No Has patient had a PCN reaction occurring within the last 10 years: No If all of the above answers are "NO", then may proceed with Cephalosporin use.      Current Outpatient Medications:  .  allopurinol (ZYLOPRIM) 300 MG tablet, Take 300 mg by mouth every morning. , Disp: , Rfl:  .  amLODipine (NORVASC) 2.5 MG tablet, TAKE ONE TABLET (2.5MG  TOTAL) BY MOUTH DAILY, Disp: 90 tablet, Rfl: 0 .  aspirin EC 81 MG tablet, Take 81 mg by mouth daily., Disp: , Rfl:  .  atorvastatin (LIPITOR) 80 MG tablet, TAKE ONE TABLET (80MG  TOTAL) BY MOUTH DAILY AT 6PM, Disp: 90 tablet, Rfl: 1 .  cholecalciferol (VITAMIN D) 1000 units tablet, Take 1,000 Units by mouth every morning. , Disp: , Rfl:  .  clopidogrel (PLAVIX) 75 MG tablet, TAKE ONE (1) TABLET BY MOUTH EVERY DAY, Disp: 90 tablet, Rfl: 1 .  metoprolol tartrate (LOPRESSOR) 50 MG tablet, TAKE ONE TABLET BY MOUTH TWICE A DAY, Disp: 180 tablet, Rfl: 2 .  oxyCODONE-acetaminophen (PERCOCET) 10-325 MG per tablet, Take 1 tablet by mouth every 8 (eight) hours as needed for pain. , Disp: , Rfl:  .  tiZANidine (ZANAFLEX) 4 MG tablet, Take 4 mg by mouth at bedtime. , Disp: , Rfl:  .  torsemide (DEMADEX) 10 MG tablet, Take 1 tablet (10 mg total) by mouth daily., Disp: 90 tablet, Rfl: 1 .  vitamin B-12 (CYANOCOBALAMIN) 1000 MCG tablet, Take 1,000 mcg by mouth daily., Disp: , Rfl:  .  amLODipine (NORVASC) 5 MG tablet, Take 0.5 tablets (2.5 mg total) by mouth daily., Disp: 90 tablet, Rfl: 1 .  nitroGLYCERIN (NITROSTAT) 0.4 MG SL tablet, Place 1 tablet (0.4 mg total) under the tongue every 5 (five) minutes as needed for chest pain., Disp: 25 tablet, Rfl: 3   PHYSICAL EXAM SECTION: 1) BP 130/74    Pulse 73   Temp 98.8 F (37.1 C)   Ht 6' (1.829 m)   Wt 259 lb (117.5 kg)   BMI 35.13 kg/m   Body mass index is 35.13 kg/m. General appearance: Well-developed well-nourished no gross deformities  2) Cardiovascular normal pulse and perfusion in the lower  extremities normal color with mild edema  3) Neurologically deep tendon reflexes are equal and normal, no sensation loss or deficits no pathologic reflexes  4) Psychological: Awake alert and oriented x3 mood and affect normal  5) Skin no lacerations or ulcerations no nodularity no palpable masses, no erythema or nodularity  6) Musculoskeletal:   Left hip skin is normal no tenderness normal range of motion hip is stable muscle tone is normal  Right hip skin is normal no tenderness normal range of motion hip is stable muscle tone normal  Nontender in his lower back well-healed midline incision no swelling  MEDICAL DECISION SECTION:  Encounter Diagnosis  Name Primary?  Marland Kitchen  Pain in right hip Yes    Imaging Pelvis and AP lateral right hip do not show a reason for the complaints noted. Plan:  (Rx., Inj., surg., Frx, MRI/CT, XR:2)  Recommend resume normal activities continue using a cane follow-up with seem to respond to prednisone  Patient has chronic left shoulder pain nonoperative because of medical problems request left shoulder injection subacromial space  Procedure note the subacromial injection shoulder left   Verbal consent was obtained to inject the  Left   Shoulder  Timeout was completed to confirm the injection site is a subacromial space of the  left  shoulder  Medication used Depo-Medrol 40 mg and lidocaine 1% 3 cc  Anesthesia was provided by ethyl chloride  The injection was performed in the left  posterior subacromial space. After pinning the skin with alcohol and anesthetized the skin with ethyl chloride the subacromial space was injected using a 20-gauge needle. There were no complications  Sterile  dressing was applied.          10:47 AM Arther Abbott, MD  03/19/2019

## 2019-03-21 ENCOUNTER — Other Ambulatory Visit: Payer: Self-pay

## 2019-03-21 ENCOUNTER — Emergency Department (HOSPITAL_COMMUNITY)
Admission: EM | Admit: 2019-03-21 | Discharge: 2019-03-21 | Disposition: A | Discharge status: Home / Self Care | Payer: Medicare Other | Attending: Emergency Medicine | Admitting: Emergency Medicine

## 2019-03-21 ENCOUNTER — Other Ambulatory Visit (HOSPITAL_COMMUNITY): Payer: Self-pay | Admitting: Physician Assistant

## 2019-03-21 ENCOUNTER — Encounter (HOSPITAL_COMMUNITY): Payer: Self-pay | Admitting: Emergency Medicine

## 2019-03-21 DIAGNOSIS — Z955 Presence of coronary angioplasty implant and graft: Secondary | ICD-10-CM | POA: Diagnosis not present

## 2019-03-21 DIAGNOSIS — Z79899 Other long term (current) drug therapy: Secondary | ICD-10-CM | POA: Diagnosis not present

## 2019-03-21 DIAGNOSIS — Z23 Encounter for immunization: Secondary | ICD-10-CM | POA: Diagnosis not present

## 2019-03-21 DIAGNOSIS — Z87891 Personal history of nicotine dependence: Secondary | ICD-10-CM | POA: Diagnosis not present

## 2019-03-21 DIAGNOSIS — L7622 Postprocedural hemorrhage and hematoma of skin and subcutaneous tissue following other procedure: Secondary | ICD-10-CM | POA: Diagnosis not present

## 2019-03-21 DIAGNOSIS — I129 Hypertensive chronic kidney disease with stage 1 through stage 4 chronic kidney disease, or unspecified chronic kidney disease: Secondary | ICD-10-CM | POA: Diagnosis not present

## 2019-03-21 DIAGNOSIS — I10 Essential (primary) hypertension: Secondary | ICD-10-CM | POA: Diagnosis not present

## 2019-03-21 DIAGNOSIS — I2510 Atherosclerotic heart disease of native coronary artery without angina pectoris: Secondary | ICD-10-CM | POA: Diagnosis not present

## 2019-03-21 DIAGNOSIS — N183 Chronic kidney disease, stage 3 (moderate): Secondary | ICD-10-CM | POA: Insufficient documentation

## 2019-03-21 DIAGNOSIS — Z6835 Body mass index (BMI) 35.0-35.9, adult: Secondary | ICD-10-CM | POA: Diagnosis not present

## 2019-03-21 DIAGNOSIS — E782 Mixed hyperlipidemia: Secondary | ICD-10-CM | POA: Diagnosis not present

## 2019-03-21 DIAGNOSIS — M5137 Other intervertebral disc degeneration, lumbosacral region: Secondary | ICD-10-CM | POA: Diagnosis not present

## 2019-03-21 DIAGNOSIS — M48061 Spinal stenosis, lumbar region without neurogenic claudication: Secondary | ICD-10-CM | POA: Diagnosis not present

## 2019-03-21 DIAGNOSIS — T148XXA Other injury of unspecified body region, initial encounter: Secondary | ICD-10-CM

## 2019-03-21 DIAGNOSIS — Z1389 Encounter for screening for other disorder: Secondary | ICD-10-CM | POA: Diagnosis not present

## 2019-03-21 DIAGNOSIS — Z0001 Encounter for general adult medical examination with abnormal findings: Secondary | ICD-10-CM | POA: Diagnosis not present

## 2019-03-21 DIAGNOSIS — I252 Old myocardial infarction: Secondary | ICD-10-CM | POA: Insufficient documentation

## 2019-03-21 DIAGNOSIS — Z7982 Long term (current) use of aspirin: Secondary | ICD-10-CM | POA: Insufficient documentation

## 2019-03-21 DIAGNOSIS — G894 Chronic pain syndrome: Secondary | ICD-10-CM | POA: Diagnosis not present

## 2019-03-21 MED ORDER — SILVER NITRATE-POT NITRATE 75-25 % EX MISC
1.0000 "application " | Freq: Once | CUTANEOUS | Status: DC
  Filled 2019-03-21: qty 1

## 2019-03-21 MED ORDER — TRANEXAMIC ACID 1000 MG/10ML IV SOLN
500.0000 mg | Freq: Once | INTRAVENOUS | Status: DC
  Filled 2019-03-21: qty 10

## 2019-03-21 NOTE — ED Notes (Signed)
pt d/c . Bleeding controlled. V/s deferred.

## 2019-03-21 NOTE — ED Triage Notes (Signed)
Pt c/o bleeding to wound on the left arm. Pt states he had wart removed from the area last week.

## 2019-03-21 NOTE — ED Provider Notes (Signed)
[facility] EMERGENCY DEPARTMENT Provider Note   CSN: [id number] Arrival date & time: 03/21/19  0002   Time seen 12:34 AM  History   Chief Complaint Chief Complaint  Patient presents with  . Wound Check    HPI Maurice Munoz is a 73 y.o. male.     HPI patient states he had a "wart" removed from the dorsum of his left forearm he thinks last week but his wife states 2 weeks ago.  This was done by Dr. [place] Crane dermatologist.  He states he had been told to use Neosporin on it however he knows he has an allergy and he had to stop.  He has been putting Silvadene ointment on it.  He states tonight while watching TV he suddenly noticed he had blood on his knee and when he looked he was having bleeding from the wound.  He states at some point it was squirting several inches in the air.  He states he does take a baby aspirin a day and Plavix but no other blood thinners.  PCP Redmond School, MD   Past Medical History:  Diagnosis Date  . Adrenal adenoma, left    small  . Aortic atherosclerosis ([place])   . Back pain   . Bilateral carotid artery stenosis followed by dr Claiborne Billings   per last carotid duplex 0000000  -- proximal LICA 0000000 ,  RICA 99991111  . CAD (coronary artery disease)    a. s/p prior intervention to RCA and PDA in 1999 b. DES to LAD and distal RCA in 2007  . Cardiomegaly    Stable  . Chronic constipation   . Chronic pain syndrome   . CKD (chronic kidney disease), stage III ([place])   . DDD (degenerative disc disease), cervical   . DDD (degenerative disc disease), lumbosacral   . Dyspnea   . Dyspnea on exertion   . Gout    per pt last inflared episode 2015 approx.  . Grade II diastolic dysfunction   . H/O epistaxis    per pt sees ENT dr Benjamine Mola for cauterization (pt takes plavix)  . History of acute pancreatitis 08/08/2014  . History of kidney stones   . HTN (hypertension)   . Hyperlipidemia   . Interstitial lung disease ([place])   . Left arm pain    s/p fall  . Nocturia    . Numbness of right foot    due to DDD lumbar  . Peripheral edema   . Pilonidal cyst   . Pleural effusion on right   . Pulmonary fibrosis ([place])    followed by pcp-- last chest CT 04-08-2016  . S/P drug eluting coronary stent placement 10/11/2005   mLAD and dRCA  . Wears dentures    upper  . Wears glasses     Patient Active Problem List   Diagnosis Date Noted  . NSTEMI (non-ST elevated myocardial infarction) ([place]) 04/13/2018  . CKD (chronic kidney disease) stage 3, GFR 30-59 ml/min ([facility]) 04/12/2018  . Pulmonary fibrosis ([place]) 04/12/2018  . Essential hypertension   . CAD in native artery 04/11/2018  . Hyperlipidemia LDL goal <70 09/25/2014  . Lower extremity edema 09/25/2014  . Nausea   . Upper abdominal pain   . Pancreatitis-Idiopathy acute 2.18.16 admitted [facility] 08/08/2014  . Gout 08/08/2014  . CAD s/p stenting 2007, prior LAD h/o 199, last myoview 3/15 Low risk 11/27/2012  . Edema 11/27/2012  . Obesity (BMI 30-39.9) 11/27/2012  . Cervical disc disease s/p diskecktomy 2003  11/27/2012  .  GOUT 10/13/2009  . KNEE PAIN 09/08/2009  . PATELLAR TENDINITIS 09/08/2009    Past Surgical History:  Procedure Laterality Date  . ANTERIOR CERVICAL DECOMP/DISCECTOMY FUSION  12-25-2001     dr Orinda Kenner [facility]   C3 -- C7  . ANTERIOR CERVICAL DECOMP/DISCECTOMY FUSION  12-11-2012   dr Carloyn Manner  [facility])  . CARDIOVASCULAR STRESS TEST  08-23-2012    dr Claiborne Billings   normal lexiscan nuclear study w/ no ischemia/  normal LV function and wall motion, ef 65%  . CARDIOVASCULAR STRESS TEST    . CORONARY ANGIOPLASTY  1999   PTCA to RCA & PDA  . CORONARY ANGIOPLASTY WITH STENT PLACEMENT  10-11-2005  dr Shelva Majestic   PCI and stenting to [place] (Cypher DES x2)  . CORONARY BALLOON ANGIOPLASTY N/A 04/14/2018   Procedure: CORONARY BALLOON ANGIOPLASTY;  Surgeon: Troy Sine, MD;  Location: [place] [facility];  Service: Cardiovascular;  Laterality: N/A;  . CORONARY STENT INTERVENTION N/A  04/14/2018   Procedure: CORONARY STENT INTERVENTION;  Surgeon: Troy Sine, MD;  Location: [place] [facility];  Service: Cardiovascular;  Laterality: N/A;  . CYSTOSCOPY W/ URETERAL STENT PLACEMENT Left 02-11-2010     [facility]  . LEFT HEART CATH N/A 04/14/2018   Procedure: Left Heart Cath;  Surgeon: Troy Sine, MD;  Location: [place] [facility];  Service: Cardiovascular;  Laterality: N/A;  . LEFT HEART CATH AND CORONARY ANGIOGRAPHY N/A 04/12/2018   Procedure: LEFT HEART CATH AND CORONARY ANGIOGRAPHY;  Surgeon: Jettie Booze, MD;  Location: [place] [facility];  Service: Cardiovascular;  Laterality: N/A;  . LUMBAR LAMINECTOMY  2015   L5 -- S1  . PILONIDAL CYST EXCISION  1980s  . PILONIDAL CYST EXCISION N/A 03/31/2017   Procedure: EXCISION OF PILONIDAL DISEASE with Flap Rotation;  Surgeon: Leighton Ruff, MD;  Location: [place];  Service: General;  Laterality: N/A;  . THROAT SURGERY  1996   per pt "removal piece of cryloid(?) that was pressing against throat"  states no issues since  . TRANSTHORACIC ECHOCARDIOGRAM  08-24-2010  dr Claiborne Billings   ef 50-55%/  mild MR/  trivial TR  . WOUND DEBRIDEMENT N/A 10/06/2017   Procedure: DEBRIDEMENT WOUND PLACEMENT OF WOUND HEALING MATRIX;  Surgeon: Leighton Ruff, MD;  Location: [place];  Service: General;  Laterality: N/A;        Home Medications    Prior to Admission medications   Medication Sig Start Date End Date Taking? Authorizing Provider  allopurinol (ZYLOPRIM) 300 MG tablet Take 300 mg by mouth every morning.  11/06/12   [provider]  amLODipine (NORVASC) 2.5 MG tablet TAKE ONE TABLET (2.5MG  TOTAL) BY MOUTH DAILY 02/27/19   [name], PA  amLODipine (NORVASC) 5 MG tablet Take 0.5 tablets (2.5 mg total) by mouth daily. 08/28/18 11/26/18  [name], PA  aspirin EC 81 MG tablet Take 81 mg by mouth daily.    [provider]  atorvastatin (LIPITOR) 80 MG tablet TAKE ONE TABLET (80MG  TOTAL) BY  MOUTH DAILY AT 6PM 11/16/18   Troy Sine, MD  cholecalciferol (VITAMIN D) 1000 units tablet Take 1,000 Units by mouth every morning.     [provider]  clopidogrel (PLAVIX) 75 MG tablet TAKE ONE (1) TABLET BY MOUTH EVERY DAY 09/18/18   [name], PA  metoprolol tartrate (LOPRESSOR) 50 MG tablet TAKE ONE TABLET BY MOUTH TWICE A DAY 09/25/18   Troy Sine, MD  nitroGLYCERIN (NITROSTAT) 0.4 MG SL tablet Place 1 tablet (0.4 mg total) under the tongue every 5 (five) minutes as needed for chest pain. 08/03/17 08/28/18  Troy Sine, MD  oxyCODONE-acetaminophen (PERCOCET) 10-325 MG per tablet Take 1 tablet by mouth every 8 (eight) hours as needed for pain.  10/11/12   [provider]  tiZANidine (ZANAFLEX) 4 MG tablet Take 4 mg by mouth at bedtime.  11/08/12   [provider]  torsemide (DEMADEX) 10 MG tablet Take 1 tablet (10 mg total) by mouth daily. 08/28/18   [name], PA  vitamin B-12 (CYANOCOBALAMIN) 1000 MCG tablet Take 1,000 mcg by mouth daily.    [provider]    Family History Family History  Problem Relation Age of Onset  . Cancer Father   . Heart attack Brother   . Diabetes Brother   . Colon cancer Neg Hx     Social History Social History   Tobacco Use  . Smoking status: Former Smoker    Packs/day: 3.00    Years: 25.00    Pack years: 75.00    Types: Cigarettes    Quit date: 11/27/1988    Years since quitting: 30.3  . Smokeless tobacco: Former Systems developer    Quit date: 03/28/1989  Substance Use Topics  . Alcohol use: No  . Drug use: No  lives at home Lives with spouse Uses a cane   Allergies   Ibuprofen, Neosporin [neomycin-bacitracin zn-polymyx], and Penicillins   Review of Systems Review of Systems  All other systems reviewed and are negative.    Physical Exam Updated Vital Signs BP (!) 170/113   Pulse (!) 117   Temp (!) 97.5 F (36.4 C)   Resp 18   Wt 117.5 kg   SpO2 95%   BMI 35.13 kg/m   Physical Exam Vitals  signs and nursing note reviewed.  Constitutional:      Appearance: Normal appearance. He is obese.  HENT:     Head: Normocephalic and atraumatic.     Right Ear: External ear normal.     Left Ear: External ear normal.     Nose: Nose normal.  Eyes:     Extraocular Movements: Extraocular movements intact.     Conjunctiva/sclera: Conjunctivae normal.  Cardiovascular:     Rate and Rhythm: Normal rate.  Pulmonary:     Effort: Pulmonary effort is normal. No respiratory distress.  Skin:    General: Skin is warm and dry.     Comments: Patient has an area on the dorsum of his left forearm that is about the size of a quarter and he shows me a area in the middle where there is a capillary blood vessel that is easily visualized and he states it was squirting blood.  He has some type of loose tourniquet on his left upper arm.  I cauterized the area where he states it was bleeding and also 2 other areas that were suspicious that they could also been bleeding.  The tourniquet was removed and there was no bleeding.  Patient was instructed to ambulate to the bathroom to see if exertion would make it start bleeding again.  Neurological:     General: No focal deficit present.     Mental Status: He is alert and oriented to person, place, and time.     Cranial Nerves: No cranial nerve deficit.  Psychiatric:        Mood and Affect: Mood normal.        Behavior:  Behavior normal.      ED Treatments / Results  Labs (all labs ordered are listed, but only abnormal results are displayed) Labs Reviewed - No data to display  EKG None  Radiology   Procedures Cauterization  Date/Time: 03/21/2019 1:14 AM Performed by: Rolland Porter, MD Authorized by: Rolland Porter, MD  Consent: Verbal consent obtained. Consent given by: patient Time out: Immediately prior to procedure a "time out" was called to verify the correct patient, procedure, equipment, support staff and site/side marked as required. Local anesthesia  used: no  Anesthesia: Local anesthesia used: no  Sedation: Patient sedated: no  Comments: A silver nitrate stick was used to cauterize the concerning areas.  There was no bleeding when I unwrapped his dressing and no bleeding after the cautery was done.  I removed the tourniquet in his upper arm and there was still no bleeding.    (including critical care time)  Medications Ordered in ED Medications  silver nitrate applicators applicator 1 application (has no administration in time range)  tranexamic acid (CYKLOKAPRON) injection 500 mg (has no administration in time range)     Initial Impression / Assessment and Plan / ED Course  I have reviewed the triage vital signs and the nursing notes.  Pertinent labs & imaging results that were available during my care of the patient were reviewed by me and considered in my medical decision making (see chart for details).      I cauterized the area at 12:58 AM.  I used one silver nitrate stick.  Recheck it 1:48 AM patient has had no further bleeding.  He was discharged home.  Final Clinical Impressions(s) / ED Diagnoses   Final diagnoses:  Bleeding from wound    ED Discharge Orders    None     Plan discharge  Rolland Porter, MD, Barbette Or, MD 03/21/19 [phone]

## 2019-03-21 NOTE — Discharge Instructions (Addendum)
If it bleeds again hold constant pressure for 5 to 10 minutes, if that does not stop the bleeding proceed back to the emergency department.

## 2019-03-27 ENCOUNTER — Other Ambulatory Visit: Payer: Self-pay | Admitting: *Deleted

## 2019-03-27 DIAGNOSIS — Z20822 Contact with and (suspected) exposure to covid-19: Secondary | ICD-10-CM

## 2019-03-27 DIAGNOSIS — Z20828 Contact with and (suspected) exposure to other viral communicable diseases: Secondary | ICD-10-CM | POA: Diagnosis not present

## 2019-03-29 LAB — NOVEL CORONAVIRUS, NAA: SARS-CoV-2, NAA: NOT DETECTED

## 2019-04-09 ENCOUNTER — Other Ambulatory Visit: Payer: Self-pay

## 2019-04-09 ENCOUNTER — Encounter: Payer: Self-pay | Admitting: Primary Care

## 2019-04-09 ENCOUNTER — Ambulatory Visit (INDEPENDENT_AMBULATORY_CARE_PROVIDER_SITE_OTHER): Payer: Medicare Other | Admitting: Primary Care

## 2019-04-09 ENCOUNTER — Telehealth: Payer: Self-pay | Admitting: Pulmonary Disease

## 2019-04-09 DIAGNOSIS — J849 Interstitial pulmonary disease, unspecified: Secondary | ICD-10-CM | POA: Diagnosis not present

## 2019-04-09 DIAGNOSIS — R0600 Dyspnea, unspecified: Secondary | ICD-10-CM

## 2019-04-09 MED ORDER — BENZONATATE 200 MG PO CAPS: 200.0000 mg | ORAL_CAPSULE | Freq: Two times a day (BID) | ORAL | 1 refills | Status: DC | PRN

## 2019-04-09 MED ORDER — PREDNISONE 10 MG PO TABS: ORAL_TABLET | ORAL | 0 refills | Status: DC

## 2019-04-09 NOTE — Telephone Encounter (Signed)
Called and spoke to patient's wife who is listed on DPR.  Patient has been having increased shortness of breath with activity and a non-productive cough. Patient's wife reports that about once a week patient coughs up a small amount of blood but that cough is usually dry. Denise fever or any other symptoms. Last OV with [name] was 05/18/2017.  Let them know that patient will need to establish with another physician soon. Scheduled patient for MyChart Video Visit. Patient's wife reported she has the app on her cell phone and will be him for appointment.  I went over the test link and the arriving for appointment.  Nothing further needed at this time.

## 2019-04-09 NOTE — Progress Notes (Signed)
Virtual Visit via Telephone Note  I connected with Maurice Munoz on 04/09/19 at  3:30 PM EDT by telephone and verified that I am speaking with the correct person using two identifiers.  Location: Patient: Home Provider: Office   I discussed the limitations, risks, security and privacy concerns of performing an evaluation and management service by telephone and the availability of in person appointments. I also discussed with the patient that there may be a patient responsible charge related to this service. The patient expressed understanding and agreed to proceed.  Synopsis: Referred in 2018 for pulmonary fibrosis, he has an extensive occupational history.  He smoked 2-3 packs of cigarettes daily for 20-25 years, quit in the 1980s.  He worked in Lobbyist, building with stenosis.  Frequently works with Engineer, materials.  He also did chrome plating for many years and was frequently exposed to and covered in from dust.  He has worked in Industrial/product designer.  History of Present Illness: 73 year old male, former smoker quit in 1990. PMH significant for pulmonary fibrosis, NSTEMI, CAD, HTN, CKF, hyperlipidemia. Patient of [name], last seen on 05/18/2017.   04/09/2019 Patient contacted today for acute televisit. States that he feels ok but reports gradual increased dyspnea with any activity. Associated dry cough, occasionally productive in the morning. Notices every now and then a tinge of hemoptysis. Last reported 1 week ago. He is on aspirin and Plavix. Tested negative for COVID this month. Kidney function within normal limits. Denies fever, chills, chest tightness or wheezing.   Observations/Objective:  - No significant shortness of breath, wheezing or cough noted  Chest imaging: October 2018 chest CT images independently reviewed showing nonspecific intralobular septal thickening but no clear honeycombing, no clear cranial cardio gradient   Pulmonary function test: November 2018 ratio 88%, FVC 2.66 L  55% predicted, total lung capacity 4 0.10 L 55% predicted, ERV 42% predicted  Assessment and Plan:  ILD - Lost to follow-up, he has not been seen in two years - Fibrosis felt to be d/t work exposure  - Reports gradual worsening of shortness of breath - Rx prednisone taper as directioned for dyspnea/wheezing  - Advised Delsym cough syrup and tessalon Perles for cough suppression  - Needs repeat HRCT, PFTs with DLCO and 6 min walk - Needs to establish with ILD clinic    Follow Up Instructions:   - Needs to establish with ILD clinic first available after testing   I discussed the assessment and treatment plan with the patient. The patient was provided an opportunity to ask questions and all were answered. The patient agreed with the plan and demonstrated an understanding of the instructions.   The patient was advised to call back or seek an in-person evaluation if the symptoms worsen or if the condition fails to improve as anticipated.  I provided 22 minutes of non-face-to-face time during this encounter.   Martyn Ehrich, NP

## 2019-04-09 NOTE — Patient Instructions (Signed)
Recommendations: Delsym cough syrup twice daily for cough suppression Monitor hemoptysis, if pure bright red or large amount let us know sooner   RX: Prednisone taper as directed   Orders: HRCT re: ILD PFTs with DLCO  6 min walk test  Follow-up Needs to establish with Dr. Vaughan Browner or Chase Caller in ILD clinic after testing

## 2019-04-10 ENCOUNTER — Encounter: Payer: Self-pay | Admitting: Primary Care

## 2019-04-11 DIAGNOSIS — Z08 Encounter for follow-up examination after completed treatment for malignant neoplasm: Secondary | ICD-10-CM | POA: Diagnosis not present

## 2019-04-11 DIAGNOSIS — Z85828 Personal history of other malignant neoplasm of skin: Secondary | ICD-10-CM | POA: Diagnosis not present

## 2019-04-11 NOTE — Progress Notes (Signed)
Reviewed, agree 

## 2019-04-18 DIAGNOSIS — G894 Chronic pain syndrome: Secondary | ICD-10-CM | POA: Diagnosis not present

## 2019-04-18 DIAGNOSIS — I10 Essential (primary) hypertension: Secondary | ICD-10-CM | POA: Diagnosis not present

## 2019-04-18 DIAGNOSIS — M2550 Pain in unspecified joint: Secondary | ICD-10-CM | POA: Diagnosis not present

## 2019-04-18 DIAGNOSIS — J449 Chronic obstructive pulmonary disease, unspecified: Secondary | ICD-10-CM | POA: Diagnosis not present

## 2019-04-18 DIAGNOSIS — Z6835 Body mass index (BMI) 35.0-35.9, adult: Secondary | ICD-10-CM | POA: Diagnosis not present

## 2019-04-24 ENCOUNTER — Ambulatory Visit (HOSPITAL_COMMUNITY)
Admission: RE | Admit: 2019-04-24 | Discharge: 2019-04-24 | Disposition: A | Discharge status: Home / Self Care | Payer: Medicare Other | Source: Ambulatory Visit | Attending: Primary Care | Admitting: Primary Care

## 2019-04-24 ENCOUNTER — Other Ambulatory Visit: Payer: Self-pay

## 2019-04-24 DIAGNOSIS — J849 Interstitial pulmonary disease, unspecified: Secondary | ICD-10-CM | POA: Diagnosis not present

## 2019-04-24 NOTE — Progress Notes (Signed)
Please remind patient of upcoming apt with Dr. Vaughan Browner, make sure he does not miss. Will review CT results during that visit.

## 2019-04-26 ENCOUNTER — Telehealth: Payer: Self-pay | Admitting: Pulmonary Disease

## 2019-04-26 NOTE — Progress Notes (Signed)
lmtcb for pt.  

## 2019-04-26 NOTE — Telephone Encounter (Signed)
  Appt 11/10  ATC pt, no answer. Left message for pt to call back.   Notes recorded by Martyn Ehrich, NP on 04/24/2019 at 11:03 AM EST  Please remind patient of upcoming apt with Dr. Vaughan Browner, make sure he does not miss. Will review CT results during that visit.

## 2019-04-27 NOTE — Telephone Encounter (Signed)
Spoke with pt. He has been reminded of his upcoming appointment. Nothing further was needed.

## 2019-05-01 ENCOUNTER — Telehealth: Payer: Self-pay | Admitting: Pulmonary Disease

## 2019-05-01 ENCOUNTER — Encounter: Payer: Self-pay | Admitting: Pulmonary Disease

## 2019-05-01 ENCOUNTER — Ambulatory Visit (INDEPENDENT_AMBULATORY_CARE_PROVIDER_SITE_OTHER): Payer: Medicare Other | Admitting: Pulmonary Disease

## 2019-05-01 ENCOUNTER — Other Ambulatory Visit: Payer: Self-pay

## 2019-05-01 ENCOUNTER — Other Ambulatory Visit (INDEPENDENT_AMBULATORY_CARE_PROVIDER_SITE_OTHER): Payer: Medicare Other

## 2019-05-01 VITALS — BP 120/82 | HR 77 | Temp 98.3°F | Wt 253.6 lb

## 2019-05-01 DIAGNOSIS — J8410 Pulmonary fibrosis, unspecified: Secondary | ICD-10-CM | POA: Diagnosis not present

## 2019-05-01 LAB — CBC WITH DIFFERENTIAL/PLATELET
Basophils Absolute: 0.1 10*3/uL (ref 0.0–0.1)
Basophils Relative: 0.6 % (ref 0.0–3.0)
Eosinophils Absolute: 0.2 10*3/uL (ref 0.0–0.7)
Eosinophils Relative: 2.2 % (ref 0.0–5.0)
HCT: 44.4 % (ref 39.0–52.0)
Hemoglobin: 14.8 g/dL (ref 13.0–17.0)
Lymphocytes Relative: 15.9 % (ref 12.0–46.0)
Lymphs Abs: 1.8 10*3/uL (ref 0.7–4.0)
MCHC: 33.3 g/dL (ref 30.0–36.0)
MCV: 88.4 fl (ref 78.0–100.0)
Monocytes Absolute: 1 10*3/uL (ref 0.1–1.0)
Monocytes Relative: 8.5 % (ref 3.0–12.0)
Neutro Abs: 8.2 10*3/uL — ABNORMAL HIGH (ref 1.4–7.7)
Neutrophils Relative %: 72.8 % (ref 43.0–77.0)
Platelets: 221 10*3/uL (ref 150.0–400.0)
RBC: 5.03 Mil/uL (ref 4.22–5.81)
RDW: 15.8 % — ABNORMAL HIGH (ref 11.5–15.5)
WBC: 11.3 10*3/uL — ABNORMAL HIGH (ref 4.0–10.5)

## 2019-05-01 LAB — COMPREHENSIVE METABOLIC PANEL
ALT: 15 U/L (ref 0–53)
AST: 23 U/L (ref 0–37)
Albumin: 4.2 g/dL (ref 3.5–5.2)
Alkaline Phosphatase: 90 U/L (ref 39–117)
BUN: 19 mg/dL (ref 6–23)
CO2: 29 mEq/L (ref 19–32)
Calcium: 9.6 mg/dL (ref 8.4–10.5)
Chloride: 102 mEq/L (ref 96–112)
Creatinine, Ser: 1.18 mg/dL (ref 0.40–1.50)
GFR: 60.8 mL/min (ref >60.00)
Glucose, Bld: 112 mg/dL — ABNORMAL HIGH (ref 70–99)
Potassium: 4 mEq/L (ref 3.5–5.1)
Sodium: 139 mEq/L (ref 135–145)
Total Bilirubin: 2 mg/dL — ABNORMAL HIGH (ref 0.2–1.2)
Total Protein: 7.3 g/dL (ref 6.0–8.3)

## 2019-05-01 LAB — CK: Total CK: 173 U/L (ref 7–232)

## 2019-05-01 MED ORDER — ANORO ELLIPTA 62.5-25 MCG/INH IN AEPB: 1.0000 | INHALATION_SPRAY | Freq: Every day | RESPIRATORY_TRACT | 0 refills | Status: DC

## 2019-05-01 MED ORDER — ANORO ELLIPTA 62.5-25 MCG/INH IN AEPB: 1.0000 | INHALATION_SPRAY | Freq: Every day | RESPIRATORY_TRACT | 3 refills | Status: DC

## 2019-05-01 NOTE — Progress Notes (Signed)
Maurice Munoz    PL:[id number]    03-18-1946  Primary Care Physician:Fusco, Purcell Nails, MD  Referring Physician: Redmond School, MD [address] [place],  [place] [address]  Chief complaint: Follow-up for interstitial lung disease, pulmonary fibrosis  HPI: 73 year old with history of coronary artery disease, pulmonary fibrosis.  Previously followed by [name].  Prior CT scan shows pulmonary fibrosis which is thought to be secondary to occupational exposure Referred back for worsening dyspnea.  He has had symptoms for the past several years but over few months he has noticed increasing dyspnea on exertion.  Unable to do minimal activities.  He was tested for Covid within the last month and was negative  Pets: Dog.  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  No ongoing exposures.  No mold, hot tub, Jacuzzi or down pillows or comforters Smoking history: 75-pack-year smoker.  Quit in 1990 Travel history: No significant travel history Relevant family history: Father died of lung cancer.  He was a smoker.  Outpatient Encounter Medications as of 05/01/2019  Medication Sig  . allopurinol (ZYLOPRIM) 300 MG tablet Take 300 mg by mouth every morning.   Marland Kitchen amLODipine (NORVASC) 2.5 MG tablet TAKE ONE TABLET (2.5MG  TOTAL) BY MOUTH DAILY  . aspirin EC 81 MG tablet Take 81 mg by mouth daily.  Marland Kitchen atorvastatin (LIPITOR) 80 MG tablet TAKE ONE TABLET (80MG  TOTAL) BY MOUTH DAILY AT 6PM  . benzonatate (TESSALON) 200 MG capsule Take 1 capsule (200 mg total) by mouth 2 (two) times daily as needed for cough.  . cholecalciferol (VITAMIN D) 1000 units tablet Take 1,000 Units by mouth every morning.   . clopidogrel (PLAVIX) 75 MG tablet TAKE ONE (1) TABLET BY MOUTH EVERY DAY  . metoprolol tartrate (LOPRESSOR) 50 MG tablet TAKE ONE TABLET BY MOUTH TWICE A DAY  .  nitroGLYCERIN (NITROSTAT) 0.4 MG SL tablet Place 1 tablet (0.4 mg total) under the tongue every 5 (five) minutes as needed for chest pain.  Marland Kitchen oxyCODONE-acetaminophen (PERCOCET) 10-325 MG per tablet Take 1 tablet by mouth every 8 (eight) hours as needed for pain.   Marland Kitchen tiZANidine (ZANAFLEX) 4 MG tablet Take 4 mg by mouth at bedtime.   . torsemide (DEMADEX) 10 MG tablet Take 1 tablet (10 mg total) by mouth daily.  . vitamin B-12 (CYANOCOBALAMIN) 1000 MCG tablet Take 1,000 mcg by mouth daily.  . [DISCONTINUED] predniSONE (DELTASONE) 10 MG tablet Take 4 tabs po daily x 2 days; then 3 tabs for 2 days; then 2 tabs for 2 days; then 1 tab for 2 days   No facility-administered encounter medications on file as of 05/01/2019.     Allergies as of 05/01/2019 - Review Complete 05/01/2019  Allergen Reaction Noted  . Ibuprofen Itching   . Neosporin [neomycin-bacitracin zn-polymyx] Swelling 05/11/2014  . Penicillins Rash     Past Medical History:  Diagnosis Date  . Adrenal adenoma, left    small  . Aortic atherosclerosis ([place])   . Back pain   . Bilateral carotid artery stenosis followed by dr Claiborne Billings   per last carotid duplex 0000000  -- proximal LICA 0000000 ,  RICA [id number]  . CAD (coronary artery disease)    a. s/p prior intervention to RCA and PDA in 1999 b. DES to LAD and distal RCA in 2007  . Cardiomegaly    Stable  .  Chronic constipation   . Chronic pain syndrome   . CKD (chronic kidney disease), stage III   . DDD (degenerative disc disease), cervical   . DDD (degenerative disc disease), lumbosacral   . Dyspnea   . Dyspnea on exertion   . Gout    per pt last inflared episode 2015 approx.  . Grade II diastolic dysfunction   . H/O epistaxis    per pt sees ENT dr Benjamine Mola for cauterization (pt takes plavix)  . History of acute pancreatitis 08/08/2014  . History of kidney stones   . HTN (hypertension)   . Hyperlipidemia   . Interstitial lung disease ([place])   . Left arm pain    s/p fall  .  Nocturia   . Numbness of right foot    due to DDD lumbar  . Peripheral edema   . Pilonidal cyst   . Pleural effusion on right   . Pulmonary fibrosis ([place])    followed by pcp-- last chest CT 04-08-2016  . S/P drug eluting coronary stent placement 10/11/2005   mLAD and dRCA  . Wears dentures    upper  . Wears glasses     Past Surgical History:  Procedure Laterality Date  . ANTERIOR CERVICAL DECOMP/DISCECTOMY FUSION  12-25-2001     dr Orinda Ken[facility]   C3 -- C7  . ANTERIOR CERVICAL DECOMP/DISCECTOMY FUSION  12-11-2012   dr Carloyn Manner  [facility])  . CARDIOVASCULAR STRESS TEST  08-23-2012    dr Claiborne Billings   normal lexiscan nuclear study w/ no ischemia/  normal LV function and wall motion, ef 65%  . CARDIOVASCULAR STRESS TEST    . CORONARY ANGIOPLASTY  1999   PTCA to RCA & PDA  . CORONARY ANGIOPLASTY WITH STENT PLACEMENT  10-11-2005  dr Shelva Majestic   PCI and stenting to [place] ([place] DES x2)  . CORONARY BALLOON ANGIOPLASTY N/A 04/14/2018   Procedure: CORONARY BALLOON ANGIOPLASTY;  Surgeon: Troy Sine, MD;  Location: [facility];  Service: Cardiovascular;  Laterality: N/A;  . CORONARY STENT INTERVENTION N/A 04/14/2018   Procedure: CORONARY STENT INTERVENTION;  Surgeon: Troy Sine, MD;  Location: [facility];  Service: Cardiovascular;  Laterality: N/A;  . CYSTOSCOPY W/ URETERAL STENT PLACEMENT Left 02-11-2010     [facility]  . LEFT HEART CATH N/A 04/14/2018   Procedure: Left Heart Cath;  Surgeon: Troy Sine, MD;  Location: [facility];  Service: Cardiovascular;  Laterality: N/A;  . LEFT HEART CATH AND CORONARY ANGIOGRAPHY N/A 04/12/2018   Procedure: LEFT HEART CATH AND CORONARY ANGIOGRAPHY;  Surgeon: Jettie Booze, MD;  Location: [facility];  Service: Cardiovascular;  Laterality: N/A;  . LUMBAR LAMINECTOMY  2015   L5 -- S1  . PILONIDAL CYST EXCISION  1980s  . PILONIDAL CYST EXCISION N/A 03/31/2017   Procedure: EXCISION OF PILONIDAL  DISEASE with Flap Rotation;  Surgeon: Leighton Ruff, MD;  Location: [facility];  Service: General;  Laterality: N/A;  . THROAT SURGERY  1996   per pt "removal piece of cryloid(?) that was pressing against throat"  states no issues since  . TRANSTHORACIC ECHOCARDIOGRAM  08-24-2010  dr Claiborne Billings   ef 50-55%/  mild MR/  trivial TR  . WOUND DEBRIDEMENT N/A 10/06/2017   Procedure: DEBRIDEMENT WOUND PLACEMENT OF WOUND HEALING MATRIX;  Surgeon: Leighton Ruff, MD;  Location: [facility];  Service: General;  Laterality: N/A;    Family History  Problem  Relation Age of Onset  . Cancer Father   . Heart attack Brother   . Diabetes Brother   . Colon cancer Neg Hx     Social History   Socioeconomic History  . Marital status: Married    Spouse name: Not on file  . Number of children: Not on file  . Years of education: Not on file  . Highest education level: Not on file  Occupational History  . Not on file  Social Needs  . Financial resource strain: Not on file  . Food insecurity    Worry: Not on file    Inability: Not on file  . Transportation needs    Medical: Not on file    Non-medical: Not on file  Tobacco Use  . Smoking status: Former Smoker    Packs/day: 3.00    Years: 25.00    Pack years: 75.00    Types: Cigarettes    Quit date: 11/27/1988    Years since quitting: 30.4  . Smokeless tobacco: Former Systems developer    Quit date: 03/28/1989  Substance and Sexual Activity  . Alcohol use: No  . Drug use: No  . Sexual activity: Not on file  Lifestyle  . Physical activity    Days per week: Not on file    Minutes per session: Not on file  . Stress: Not on file  Relationships  . Social Herbalist on phone: Not on file    Gets together: Not on file    Attends religious service: Not on file    Active member of club or organization: Not on file    Attends meetings of clubs or organizations: Not on file    Relationship status: Not on file  . Intimate  partner violence    Fear of current or ex partner: Not on file    Emotionally abused: Not on file    Physically abused: Not on file    Forced sexual activity: Not on file  Other Topics Concern  . Not on file  Social History Narrative   Previously used used to work on a farm   Currently retired secondary to chronic back pains-   Used to be a Art therapist   Married to his wife and lives in [place]   Father died at age 26 lung cancer   Mother had CAD   Wife had cancer    Review of systems: Review of Systems  Constitutional: Negative for fever and chills.  HENT: Negative.   Eyes: Negative for blurred vision.  Respiratory: as per HPI  Cardiovascular: Negative for chest pain and palpitations.  Gastrointestinal: Negative for vomiting, diarrhea, blood per rectum. Genitourinary: Negative for dysuria, urgency, frequency and hematuria.  Musculoskeletal: Negative for myalgias, back pain and joint pain.  Skin: Negative for itching and rash.  Neurological: Negative for dizziness, tremors, focal weakness, seizures and loss of consciousness.  Endo/Heme/Allergies: Negative for environmental allergies.  Psychiatric/Behavioral: Negative for depression, suicidal ideas and hallucinations.  All other systems reviewed and are negative.  Physical Exam: Blood pressure 120/82, pulse 77, temperature 98.3 F (36.8 C), temperature source Temporal, weight 253 lb 9.6 oz (115 kg), SpO2 97 %. Gen:      No acute distress HEENT:  EOMI, sclera anicteric Neck:     No masses; no thyromegaly Lungs:    Bilateral basal crackles CV:         Regular rate and rhythm; no murmurs Abd:      +  bowel sounds; soft, non-tender; no palpable masses, no distension Ext:    No edema; adequate peripheral perfusion Skin:      Warm and dry; no rash Neuro: alert and oriented x 3 Psych: normal mood and affect  Data Reviewed: Imaging: CT high-resolution February 05, 2015 patchy areas of groundglass  attenuation, septal thickening with mild basal gradient. CT chest high-resolution 05/09/2016-subpleural reticulation, mild traction bronchiolectasis, mild honeycombing.  Definite UIP pattern high-resolution  CT chest high-resolution 04/24/2019-progression of fibrotic lung lung disease and definite UIP pattern. I have reviewed the images personally.  PFTs: 04/29/2017 FVC 2.66 [55%], FEV1 2.35 [66%], F/F 88, TLC 4.10 [55%], DLCO 15.60 [44%] Moderate restriction with severe diffusion defect.  Worsening lung function compared to 2016.  Labs: CTD serologies 02/23/2017-negative ANA, Jo 1, centromere antibody, rheumatoid factor, Ro, La, SCL 70  Assessment:  Idiopathic pulmonary fibrosis He has 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  Although there is some occupational exposures he has not had ongoing exposure since 2000.  He does not have significant exposure history or signs and symptoms of connective tissue disease. This is likely idiopathic pulmonary fibrosis  We will recheck some serologies including myositis panel for completion sake. Start paperwork for Esbriet Discussed diagnosis and treatment plan in detail with patient and wife and all patients answered We will start Anoro inhaler as he likely has underlying COPD Recheck pulmonary function test, 6-minute walk test Refer to pulmonary rehab  More then 1/2 the time of the 40 min visit was spent in counseling and/or coordination of care with the patient and family.  Plan/Recommendations: - Comprehensive metabolic panel, CBC, ANA rheumatoid factor, CCP, myositis panel CK, aldolase - Start Anoro, paperwork for Esbriet - Pulmonary function test, 6-minute walk test  Marshell Garfinkel MD [facility] 05/01/2019, 8:53 AM  CC: Redmond School, MD

## 2019-05-01 NOTE — Patient Instructions (Signed)
We will start you on a medication called Esbriet for the pulmonary fibrosis We will also give you an Anoro inhaler Schedule pulmonary function test and 6-minute walk test Check some labs today including comprehensive metabolic panel, CBC differential, alpha-1 antitrypsin levels and phenotype, ANA, rheumatoid factor, CCP, CK, aldolase and myositis panel  Follow-up in 1 month.

## 2019-05-02 ENCOUNTER — Telehealth: Payer: Self-pay | Admitting: Pharmacy Technician

## 2019-05-02 NOTE — Telephone Encounter (Signed)
Received [employer]. Will update as we work through the benefits process.  4:24 PM Beatriz Chancellor, CPhT

## 2019-05-02 NOTE — Telephone Encounter (Signed)
Left message for patient to call back. Spoke with Thomson to see if a 30mw at [facility] would be possible. She stated that it was.

## 2019-05-03 ENCOUNTER — Other Ambulatory Visit: Payer: Self-pay | Admitting: Primary Care

## 2019-05-03 NOTE — Telephone Encounter (Signed)
Received notification from [employer] regarding a prior authorization for Esbriet 267mg  tablets. Authorization has been APPROVED from 04/03/2019 to 05/02/2020.   Authorization # [id number]   Ran test claim, patient's copay for 1 month of Esbriet is $2,002.25. Will initiate patient assistance for patient.  1:26 PM Beatriz Chancellor, CPhT

## 2019-05-03 NOTE — Telephone Encounter (Signed)
Spoke with patient. Advised him that it would be possible. He would like for this to be done before his OV with Dr. Vaughan Browner on 12/14.   PCCs, I see there is already an order for the PFT and 47mw. Please advise if these orders need to be changed. Thanks!

## 2019-05-03 NOTE — Telephone Encounter (Signed)
Beth, please advise if it is okay to refill med for pt. Thanks! 

## 2019-05-04 ENCOUNTER — Telehealth: Payer: Self-pay | Admitting: Pulmonary Disease

## 2019-05-04 DIAGNOSIS — J8410 Pulmonary fibrosis, unspecified: Secondary | ICD-10-CM

## 2019-05-04 NOTE — Telephone Encounter (Signed)
fine

## 2019-05-04 NOTE — Telephone Encounter (Signed)
Refill sent.  Nothing further needed  

## 2019-05-04 NOTE — Telephone Encounter (Signed)
Submitted Patient Assistance Application to [employer] for [employer] along with provider portion, PA and income documents. Will follow up to confirm receipt. Will update patient when we receive a response.  Will send documents to scan center.  Fax# [phone] Phone# [phone]  3:23 PM Beatriz Chancellor, CPhT

## 2019-05-04 NOTE — Telephone Encounter (Signed)
Refill sent to pharmacy for pt's tessalon. Nothing further needed.

## 2019-05-04 NOTE — Telephone Encounter (Signed)
Yes, that is fine. 

## 2019-05-04 NOTE — Telephone Encounter (Signed)
Pt sched for 12/17 for PFT & 6MW at [facility], COVID test 12/14 @ 10 @ [facility].  Moved apt w/ PM to 11/18 @ 9.  Pt notified.

## 2019-05-04 NOTE — Telephone Encounter (Signed)
Called patient to discuss Esbriet benefits investigation findings. Initiated patient assistance for patient; submitted application for IPF grant through [employer]. Application is pending. Will follow up on Monday.  Phone# [phone]  Patient mentioned that he tried to pick up Anoro Ellipta from the pharmacy and said his copay was $308.88 and he could not afford that. Started to discuss patient assistance options for Anoro, and patient stated that he did not think medication was working. Advised that I would forward this information and someone would follow up with him.  *Ran test claim and confirmed $308.88 copay. Patient has a remaining $278.88 pharmacy deductible apply to all claims.  2:27 PM Beatriz Chancellor, CPhT

## 2019-05-04 NOTE — Progress Notes (Signed)
Patient has already seen Dr Vaughan Browner 05/01/2019

## 2019-05-05 LAB — ALDOLASE: Aldolase: 5.5 U/L (ref <=8.1)

## 2019-05-05 LAB — ANA,IFA RA DIAG PNL W/RFLX TIT/PATN
Anti Nuclear Antibody (ANA): NEGATIVE
Cyclic Citrullin Peptide Ab: <16 UNITS
Rhuematoid fact SerPl-aCnc: 16 IU/mL — ABNORMAL HIGH (ref <14)

## 2019-05-05 LAB — ALPHA-1 ANTITRYPSIN PHENOTYPE: A-1 Antitrypsin, Ser: 192 mg/dL (ref 83–199)

## 2019-05-08 ENCOUNTER — Telehealth: Payer: Self-pay | Admitting: Cardiovascular Disease

## 2019-05-08 ENCOUNTER — Encounter: Payer: Self-pay | Admitting: Cardiovascular Disease

## 2019-05-08 ENCOUNTER — Other Ambulatory Visit: Payer: Self-pay

## 2019-05-08 ENCOUNTER — Ambulatory Visit (INDEPENDENT_AMBULATORY_CARE_PROVIDER_SITE_OTHER): Payer: Medicare Other | Admitting: Cardiovascular Disease

## 2019-05-08 DIAGNOSIS — R0600 Dyspnea, unspecified: Secondary | ICD-10-CM

## 2019-05-08 DIAGNOSIS — J8410 Pulmonary fibrosis, unspecified: Secondary | ICD-10-CM | POA: Diagnosis not present

## 2019-05-08 DIAGNOSIS — I10 Essential (primary) hypertension: Secondary | ICD-10-CM | POA: Diagnosis not present

## 2019-05-08 DIAGNOSIS — M25473 Effusion, unspecified ankle: Secondary | ICD-10-CM | POA: Diagnosis not present

## 2019-05-08 DIAGNOSIS — I25118 Atherosclerotic heart disease of native coronary artery with other forms of angina pectoris: Secondary | ICD-10-CM | POA: Diagnosis not present

## 2019-05-08 DIAGNOSIS — R0609 Other forms of dyspnea: Secondary | ICD-10-CM

## 2019-05-08 DIAGNOSIS — I2510 Atherosclerotic heart disease of native coronary artery without angina pectoris: Secondary | ICD-10-CM

## 2019-05-08 DIAGNOSIS — E785 Hyperlipidemia, unspecified: Secondary | ICD-10-CM | POA: Diagnosis not present

## 2019-05-08 MED ORDER — TORSEMIDE 10 MG PO TABS: 10.0000 mg | ORAL_TABLET | Freq: Every day | ORAL | 1 refills | Status: DC

## 2019-05-08 MED ORDER — AMLODIPINE BESYLATE 5 MG PO TABS: 5.0000 mg | ORAL_TABLET | Freq: Every day | ORAL | 11 refills | Status: DC

## 2019-05-08 MED ORDER — AMLODIPINE BESYLATE 5 MG PO TABS: 5.0000 mg | ORAL_TABLET | Freq: Every day | ORAL | 1 refills | Status: DC

## 2019-05-08 MED ORDER — ISOSORBIDE MONONITRATE ER 30 MG PO TB24: 30.0000 mg | ORAL_TABLET | Freq: Every day | ORAL | 3 refills | Status: DC

## 2019-05-08 MED ORDER — ISOSORBIDE MONONITRATE ER 30 MG PO TB24: 30.0000 mg | ORAL_TABLET | Freq: Every day | ORAL | 1 refills | Status: DC

## 2019-05-08 NOTE — Patient Instructions (Signed)
Medication Instructions:  START ISOSORBIDE 30MG  DAILY MAY TAKE ADDITIONAL TORSEMIDE AS NEEDED FOR ADDITIONAL SWELLING. If you need a refill on your cardiac medications before your next appointment, please call your pharmacy.  Follow-Up: IN 2- 3 months In Person Shelva Majestic, MD.    At [facility], you and your health needs are our priority.  As part of our continuing mission to provide you with exceptional heart care, we have created designated Provider Care Teams.  These Care Teams include your primary Cardiologist (physician) and Advanced Practice Providers (APPs -  Physician Assistants and Nurse Practitioners) who all work together to provide you with the care you need, when you need it.  Thank you for choosing [facility] at [facility]!!

## 2019-05-08 NOTE — Telephone Encounter (Signed)
Maurice Munoz from [facility] is calling to verify that she can give a 90 day supply for amLODipine (NORVASC) 5 MG tablet / isosorbide mononitrate (IMDUR) 30 MG 24 hr tablet / torsemide (DEMADEX) 10 MG tablet

## 2019-05-08 NOTE — Progress Notes (Signed)
Patient ID: Maurice Munoz, male   DOB: 1945/10/04, 73 y.o.   MRN: [id number]      HPI: Maurice Munoz, is a 73 y.o. male who presents to the office today for a 20 month cardiology followup evaluation.     [name] has established CAD dating back to 1999 when he underwent intervention to his RCA and PDA. In April 2007 he underwent stenting to his LAD and distal RCA at which time Cypher DES stents were inserted. He has a history of hypertension, obesity, hyperlipidemia, gout, and  lower extremity edema.   A two-year followup nuclear perfusion study in March 2014 was essentially unchanged from his prior study and continued to show fairly normal perfusion and was low risk, without evidence for scar. There was no significant ischemia.  In 2015 he had of the Plavix for 3 months due to injury to his right leg.  He has noted some intermittent leg swelling.  He had carotids Doppler study in January 2016 which showed 50-60% left proximal ICA diameter reduction, which had slightly increased from his prior study one year previously.  Because of a history of coughing up blood with pleuritic chest pain he underwent a CT [place] of his chest on March 21 which was negative for PE but showed diffuse peripheral lung densities throughout both upper and lower zones without honeycombing raising the [name] concern for mild fibrotic changes but the pattern was nonspecific.    Over the last several years he has lost over 40 pounds from his peak weight.  He  was seen in the emergency room with lightheadedness and was hypotensive.  He was told to discontinue his isosorbide as well as lisinopril.   In the setting of his hypotension he was found to have an increased creatinine at 1.4 recently had been 1.1. He has been followed by Dr.Befakadu in [place] for his renal issues.  Over the past year, he has experienced occasional episodes of chest pain which he describes as tightness.  Typically, this can occur with  exertion.  At times he also notices a chest burning.  He admits to shortness of breath.  He has been evaluated by [name] for pulmonary fibrosis.  He has an extensive occupational history infrequently had worked in Lobbyist, Engineer, materials, and chrome plating for many years and was often exposed to significant dust inhalation.  He also had worked in Industrial/product designer.  There is remote tobacco history of 2-3 packs per day for 20-25 years, but unfortunately he quit in 1980s.  He is felt most likely to have interstitial lung disease and continues further evaluation.  He has had difficulty with his low back.  He has been on lisinopril 5 mg, metoprolol 50 mg twice a day.  He continues to take simvastatin 20 mg for hyperlipidemia.  He is been maintained on aspirin and Plavix.  There is remote history of gout, which is controlled with allopurinol.  When I saw him on February 2019, he had noticed episodes of chest burning as well as chest tightness and continues to experience exertional dyspnea.  I initiated amlodipine at 5 mg.  I strongly recommended support stockings for his potential edema.  He was given a prescription for sublabel nitroglycerin.  He underwent a follow-up [place] study of 08/12/2017 which was low risk and without ischemia.  EF was 57%.  There was minimal apical thinning most likely contributed by his body habitus.  He underwent an echo Doppler study on 10/14/2017 which showed  an EF of 60-65% and grade 2 diastolic dysfunction.  There was moderate pulmonary hypertension with PA pressure at 46 mm.    At evaluation in March 2019 he was feeling better.  He denied recurrent chest pain or shortness of breath.  He is lost 21 pounds in 3 weeks purposefully.   Since I last saw him in the office, he developed recurrent chest pain symptomatology and developed unstable angina symptoms leading to hospitalization with a non-ST segment elevation MI.  On April 12, 2018 he underwent diagnostic catheterization by Dr.  Illene Bolus due to multivessel CAD he underwent surgical consultation for consideration of CABG surgery but was turned down.  As result, on April 14, 2018, I performed successful multi lesion/multivessel PCI to the RCA and LAD vessels.  He underwent Cutting Balloon PTCA to the mid PDA vessel of the RCA with a 95% stenosis being reduced to 15% and Cutting Balloon PTCA DES stenting with a 2.5 x12 mm Resolute stent inserted into the distal RCA immediately proximal to the takeoff of the PDA vessel with stent overlap to the previously placed stent with a 99% stenosis reduced to 0%.  He also underwent 3 lesion intervention to the LAD with PTCA of the 90% apical LAD stenosis, PTCA DES stenting at 2 additional study sites in the LAD successfully.  He still had residual concomitant 80% AV groove circumflex and circumflex marginal disease which was treated medically.  Mr. Garske has noted significant improvement following his multilesion multivessel intervention to his RCA and LAD.  He has been seen in the office since by [name], NP as well as [name], PA.  When last seen by [name], he reduced his amlodipine since his blood pressure was low and he also had 2+ pitting edema for which he recommended restarting his previous torsemide daily.  Presently, he states he had done well but for the past 2 weeks has again begun to notice some recurrent episodes of chest pain and shortness of breath.  He also has been diagnosed with pulmonary fibrosis.  He continues to experience mild leg swelling.  He presents for evaluation.   Past Medical History:  Diagnosis Date  . Adrenal adenoma, left    small  . Aortic atherosclerosis ([place])   . Back pain   . Bilateral carotid artery stenosis followed by dr Claiborne Billings   per last carotid duplex [id number]  -- proximal LICA 87-86% ,  RICA 7-67%  . CAD (coronary artery disease)    a. s/p prior intervention to RCA and PDA in 1999 b. DES to LAD and distal RCA in 2007  . Cardiomegaly     Stable  . Chronic constipation   . Chronic pain syndrome   . CKD (chronic kidney disease), stage III   . DDD (degenerative disc disease), cervical   . DDD (degenerative disc disease), lumbosacral   . Dyspnea   . Dyspnea on exertion   . Gout    per pt last inflared episode 2015 approx.  . Grade II diastolic dysfunction   . H/O epistaxis    per pt sees ENT dr Benjamine Mola for cauterization (pt takes plavix)  . History of acute pancreatitis 08/08/2014  . History of kidney stones   . HTN (hypertension)   . Hyperlipidemia   . Interstitial lung disease ([place])   . Left arm pain    s/p fall  . Nocturia   . Numbness of right foot    due to DDD lumbar  . Peripheral edema   .  Pilonidal cyst   . Pleural effusion on right   . Pulmonary fibrosis ([place])    followed by pcp-- last chest CT 04-08-2016  . S/P drug eluting coronary stent placement 10/11/2005   mLAD and dRCA  . Wears dentures    upper  . Wears glasses     Past Surgical History:  Procedure Laterality Date  . ANTERIOR CERVICAL DECOMP/DISCECTOMY FUSION  12-25-2001     dr Orinda [facility]   C3 -- C7  . ANTERIOR CERVICAL DECOMP/DISCECTOMY FUSION  12-11-2012   dr Carloyn Manner  [facility])  . CARDIOVASCULAR STRESS TEST  08-23-2012    dr Claiborne Billings   normal lexiscan nuclear study w/ no ischemia/  normal LV function and wall motion, ef 65%  . CARDIOVASCULAR STRESS TEST    . CORONARY ANGIOPLASTY  1999   PTCA to RCA & PDA  . CORONARY ANGIOPLASTY WITH STENT PLACEMENT  10-11-2005  dr Shelva Majestic   PCI and stenting to [place] (Cypher DES x2)  . CORONARY BALLOON ANGIOPLASTY N/A 04/14/2018   Procedure: CORONARY BALLOON ANGIOPLASTY;  Surgeon: Troy Sine, MD;  Location: [place] [facility];  Service: Cardiovascular;  Laterality: N/A;  . CORONARY STENT INTERVENTION N/A 04/14/2018   Procedure: CORONARY STENT INTERVENTION;  Surgeon: Troy Sine, MD;  Location: [place] [facility];  Service: Cardiovascular;  Laterality: N/A;  . CYSTOSCOPY W/  URETERAL STENT PLACEMENT Left 02-11-2010     [facility]  . LEFT HEART CATH N/A 04/14/2018   Procedure: Left Heart Cath;  Surgeon: Troy Sine, MD;  Location: [place] [facility];  Service: Cardiovascular;  Laterality: N/A;  . LEFT HEART CATH AND CORONARY ANGIOGRAPHY N/A 04/12/2018   Procedure: LEFT HEART CATH AND CORONARY ANGIOGRAPHY;  Surgeon: Jettie Booze, MD;  Location: [place];  Service: Cardiovascular;  Laterality: N/A;  . LUMBAR LAMINECTOMY  2015   L5 -- S1  . PILONIDAL CYST EXCISION  1980s  . PILONIDAL CYST EXCISION N/A 03/31/2017   Procedure: EXCISION OF PILONIDAL DISEASE with Flap Rotation;  Surgeon: Leighton Ruff, MD;  Location: [facility];  Service: General;  Laterality: N/A;  . THROAT SURGERY  1996   per pt "removal piece of cryloid(?) that was pressing against throat"  states no issues since  . TRANSTHORACIC ECHOCARDIOGRAM  08-24-2010  dr Claiborne Billings   ef 50-55%/  mild MR/  trivial TR  . WOUND DEBRIDEMENT N/A 10/06/2017   Procedure: DEBRIDEMENT WOUND PLACEMENT OF WOUND HEALING MATRIX;  Surgeon: Leighton Ruff, MD;  Location: [facility];  Service: General;  Laterality: N/A;    Allergies  Allergen Reactions  . Ibuprofen Itching    Motrin brand  . Neosporin [Neomycin-Bacitracin Zn-Polymyx] Swelling  . Penicillins Rash     Has patient had a PCN reaction causing immediate rash, facial/tongue/throat swelling, SOB or lightheadedness with hypotension: No Has patient had a PCN reaction causing severe rash involving mucus membranes or skin necrosis: No Has patient had a PCN reaction that required hospitalization: No Has patient had a PCN reaction occurring within the last 10 years: No If all of the above answers are "NO", then may proceed with Cephalosporin use.     Current Outpatient Medications  Medication Sig Dispense Refill  . allopurinol (ZYLOPRIM) 300 MG tablet Take 300 mg by mouth every morning.     Marland Kitchen aspirin EC 81 MG tablet Take  81 mg by mouth daily.    Marland Kitchen atorvastatin (LIPITOR) 80 MG tablet TAKE ONE  TABLET (80MG TOTAL) BY MOUTH DAILY AT 6PM 90 tablet 1  . benzonatate (TESSALON) 200 MG capsule TAKE ONE CAPSULE (200MG TOTAL) BY MOUTH TWO TIMES DAILY AS NEEDED FOR COUGH 20 capsule 1  . cholecalciferol (VITAMIN D) 1000 units tablet Take 1,000 Units by mouth every morning.     . clopidogrel (PLAVIX) 75 MG tablet TAKE ONE (1) TABLET BY MOUTH EVERY DAY 90 tablet 0  . metoprolol tartrate (LOPRESSOR) 50 MG tablet TAKE ONE TABLET BY MOUTH TWICE A DAY 180 tablet 2  . oxyCODONE-acetaminophen (PERCOCET) 10-325 MG per tablet Take 1 tablet by mouth every 8 (eight) hours as needed for pain.     Marland Kitchen tiZANidine (ZANAFLEX) 4 MG tablet Take 4 mg by mouth at bedtime.     . torsemide (DEMADEX) 10 MG tablet Take 1 tablet (10 mg total) by mouth daily. MAY TAKE ADDITIONAL AS NEEDED FOR SWELLING 135 tablet 1  . umeclidinium-vilanterol (ANORO ELLIPTA) 62.5-25 MCG/INH AEPB Inhale 1 puff into the lungs daily. 14 each 0  . umeclidinium-vilanterol (ANORO ELLIPTA) 62.5-25 MCG/INH AEPB Inhale 1 puff into the lungs daily. 60 each 3  . vitamin B-12 (CYANOCOBALAMIN) 1000 MCG tablet Take 1,000 mcg by mouth daily.    Marland Kitchen amLODipine (NORVASC) 5 MG tablet Take 1 tablet (5 mg total) by mouth daily. 90 tablet 1  . isosorbide mononitrate (IMDUR) 30 MG 24 hr tablet Take 1 tablet (30 mg total) by mouth daily. 90 tablet 1  . nitroGLYCERIN (NITROSTAT) 0.4 MG SL tablet Place 1 tablet (0.4 mg total) under the tongue every 5 (five) minutes as needed for chest pain. 25 tablet 3   No current facility-administered medications for this visit.     Socially he is married. There are no children. Has a remote tobacco history having quit approximately 16 years ago. He's not very active. He does not routinely exercise.  ROS General: Negative; No fevers, chills, or night sweats;  HEENT: Negative; No changes in vision or hearing, sinus congestion, difficulty swallowing Pulmonary:  Positive for pulmonary fibrosis Cardiovascular: See history of present illness GI: Negative; No nausea, vomiting, diarrhea, or abdominal pain GU: Negative; No dysuria, hematuria, or difficulty voiding Musculoskeletal: Negative; no myalgias, joint pain, or weakness Hematologic/Oncology: Negative; no easy bruising, bleeding Endocrine: Negative; no heat/cold intolerance; no diabetes Neuro: Negative; no changes in balance, headaches Skin: Negative; No rashes or skin lesions Psychiatric: Negative; No behavioral problems, depression Sleep: Negative; No snoring, daytime sleepiness, hypersomnolence, bruxism, restless legs, hypnogognic hallucinations, no cataplexy Other comprehensive 14 point system review is negative.   PE BP 139/69   Pulse 81   Temp (!) 97.2 F (36.2 C)   Ht 6' (1.829 m)   Wt 256 lb 12.8 oz (116.5 kg)   SpO2 91%   BMI 34.83 kg/m    Repeat blood pressure by me 132/70  Wt Readings from Last 3 Encounters:  05/08/19 256 lb 12.8 oz (116.5 kg)  05/01/19 253 lb 9.6 oz (115 kg)  03/21/19 259 lb 0.7 oz (117.5 kg)   General: Alert, oriented, no distress.  Skin: normal turgor, no rashes, warm and dry HEENT: Normocephalic, atraumatic. Pupils equal round and reactive to light; sclera anicteric; extraocular muscles intact;  Nose without nasal septal hypertrophy Mouth/Parynx benign; Mallinpatti scale 3 Neck: No JVD, no carotid bruits; normal carotid upstroke Lungs: clear to ausculatation and percussion; no wheezing or rales Chest wall: without tenderness to palpitation Heart: PMI not displaced, RRR, s1 s2 normal, 1/6 systolic murmur, no diastolic murmur, no rubs, gallops,  thrills, or heaves Abdomen: soft, nontender; no hepatosplenomehaly, BS+; abdominal aorta nontender and not dilated by palpation. Back: no CVA tenderness Pulses 2+ Musculoskeletal: full range of motion, normal strength, no joint deformities Extremities: 1+ ankle edema bilaterally;no clubbing cyanosis, Homan's  sign negative  Neurologic: grossly nonfocal; Cranial nerves grossly wnl Psychologic: Normal mood and affect    ECG (independently read by me): Normal sinus rhythm at 75 bpm.  No ectopy.  Normal intervals  March 2019 ECG (independently read by me): Bradycardia 57 bpm.  No ectopy.  Normal intervals.  February 2018 ECG (independently read by me): Sinus bradycardia 57 bpm.  No ectopy.  Normal intervals.  January 2018 ECG (independently read by me): Sinus rhythm at 60 bpm.  No ectopy.  Normal intervals.  June 2017 ECG (independently read by me): Normal sinus rhythm at 61 bpm.  No ectopy.  Normal intervals.  April 2016 ECG (independently read by me): Normal sinus rhythm at 67 bpm.  No ectopy.  Normal intervals.  October 2014 ECG: Normal sinus rhythm at 63 beats per minute. No significant ST abnormalities. Normal intervals.  LABS:  BMP Latest Ref Rng & Units 05/01/2019 05/16/2018 04/15/2018  Glucose 70 - 99 mg/dL 112(H) 105(H) 92  BUN 6 - 23 mg/dL 19 17 14   Creatinine 0.40 - 1.50 mg/dL 1.18 1.16 1.12  BUN/Creat Ratio 10 - 24 - - -  Sodium 135 - 145 mEq/L 139 139 139  Potassium 3.5 - 5.1 mEq/L 4.0 4.0 4.2  Chloride 96 - 112 mEq/L 102 107 106  CO2 19 - 32 mEq/L 29 26 27   Calcium 8.4 - 10.5 mg/dL 9.6 9.2 8.9    Hepatic Function Latest Ref Rng & Units 05/01/2019 04/12/2018 11/24/2015  Total Protein 6.0 - 8.3 g/dL 7.3 6.3(L) 6.5  Albumin 3.5 - 5.2 g/dL 4.2 3.7 4.1  AST 0 - 37 U/L 23 20 19   ALT 0 - 53 U/L 15 14 14   Alk Phosphatase 39 - 117 U/L 90 72 88  Total Bilirubin 0.2 - 1.2 mg/dL 2.0(H) 1.3(H) 1.0  Bilirubin, Direct 0.0 - 0.5 mg/dL - - -   CBC Latest Ref Rng & Units 05/01/2019 05/16/2018 04/15/2018  WBC 4.0 - 10.5 K/uL 11.3(H) 8.1 10.5  Hemoglobin 13.0 - 17.0 g/dL 14.8 13.5 12.7(L)  Hematocrit 39.0 - 52.0 % 44.4 42.4 39.3  Platelets 150.0 - 400.0 K/uL 221.0 215 198   Lab Results  Component Value Date   MCV 88.4 05/01/2019   MCV 91.0 05/16/2018   MCV 90.8 04/15/2018     Lab Results  Component Value Date   TSH 1.150 11/24/2015   Lipid Panel     Component Value Date/Time   CHOL 146 04/13/2018 0513   CHOL 144 11/24/2015 1127   TRIG 133 04/13/2018 0513   HDL 33 (L) 04/13/2018 0513   HDL 39 (L) 11/24/2015 1127   CHOLHDL 4.4 04/13/2018 0513   VLDL 27 04/13/2018 0513   LDLCALC 86 04/13/2018 0513   LDLCALC 79 11/24/2015 1127     RADIOLOGY: No results found.   Other studies Reviewed: LHC 04/14/2018: Conclusion     Mid RCA lesion is 65% stenosed.  Previously placed Dist RCA-1 stent (unknown type) is widely patent.  Dist RCA-2 lesion is 99% stenosed.  Ost RPDA to RPDA lesion is 95% stenosed.  Ost 1st Mrg to 1st Mrg lesion is 80% stenosed.  Prox Cx lesion is 80% stenosed.  Prox LAD to Mid LAD lesion is 10% stenosed.  Mid LAD to [facility]  LAD lesion is 80% stenosed.  Dist LAD lesion is 90% stenosed.  Post intervention, there is a 20% residual stenosis.  Post intervention, there is a 15% residual stenosis.  Post intervention, there is a 0% residual stenosis.  A stent was successfully placed.  Post intervention, there is a 0% residual stenosis.  Post intervention, there is a 0% residual stenosis.  Mid LAD lesion is 80% stenosed.  A stent was successfully placed.  Post intervention, there is a 0% residual stenosis.  Post intervention, there is a 0% residual stenosis.  A stent was successfully placed.  Successful multilesion/multivessel PCI to the RCA and LAD vessels.  Cutting Balloon/PTCA to the mid PDA vessel of the RCA with a 95% stenosis being reduced to 15%, and Cutting Balloon/PTCA/DES stenting with a 2.5 x 12 mm Resolute Onyx DES stent inserted in the distal RCA immediately proximal to the takeoff of the PDA vessel with stent overlap to the previously placed stent and postdilatation up to 2.75 mm with the 99% stenosis being reduced to 0%.  Three lesion intervention to the LAD with PTCA of the apical 90% LAD stenosis  reduced to less than 30%, PTCA/DES stenting with a 3.0 x 22 mm Resolute Onyx DES stent postdilated to 3.25 mm with the 80% stenosis being reduced to 0% in the 80% stenosis just distal to the old LAD stent treated with PTCA/DES stenting with a 3.0 x 18 mm Resolute Onyx DES stent postdilated 3.3 mm with the 80% stenosis being reduced to 0%.  LVEDP preintervention was 15 mmHg.  RECOMMENDATION: The patient will be being maintained on dual antiplatelet therapy probably indefinitely due to significant multi-vessel disease and intervention. Increase medical therapy for concomitant CAD. Patient will be hydrated post procedure. Consider discharge over the weekend on increased medical therapy. If recurrent symptoms develop consider staged PCI to his circumflex marginal and AV groove circumflex vessels.  __________  LHC 04/12/2018: Conclusion     The left ventricular ejection fraction is 50-55% by visual estimate.  The left ventricular systolic function is normal.  LV end diastolic pressure is mildly elevated. LVEDP 16 mm Hg.  There is no aortic valve stenosis.  Previously placed Dist RCA-1 stent (unknown type) is widely patent.  Dist RCA-2 lesion is 95% stenosed.  RPDA lesion is 80% stenosed.  Prox Cx lesion is 80% stenosed.  Ost 1st Mrg to 1st Mrg lesion is 80% stenosed.  Previously placed Mid LAD-1 stent (unknown type) is widely patent.  Mid LAD-2 lesion is 75% stenosed.  Ost 2nd Diag to 2nd Diag lesion is 75% stenosed.  Dist LAD lesion is 75% stenosed.  Ost 1st Diag lesion is 50% stenosed.  Prox LAD lesion is 25% stenosed.  Multivessel CAD. Plan for cardiac surgery consult. He does have diffuse distal LAD disease which is not optimal for LIMA to LAD. If he is not a candidate for CABG, would plan to bring back for PCI.      IMPRESSION:  1. Coronary artery disease involving native coronary artery of native heart with other form of angina pectoris ([place])   2.  Essential hypertension   3. DOE (dyspnea on exertion)   4. Pulmonary fibrosis ([place])   5. Ankle swelling, unspecified laterality   6. Hyperlipidemia LDL goal <70      ASSESSMENT AND PLAN: Mr. Frisk is a 73 year old Caucasian male who is 21 years since his initial intervention to his RCA and PDA and 13 years since stenting to his LAD and distal RCA.  He has a history of hypertension.   Due to his significant occupational exposure, remote tobacco, and abnormal PFTs and CT imaging he is felt most likely to have interstitial lung disease.  Since his last office evaluation with me, he developed recurrent unstable anginal symptomatology and was found to have progressive multivessel CAD.  He was turned down for CABG revascularization.  He underwent complex multi lesion multivessel PCI by me on April 14, 2018 at several sites in his RCA and LAD.  At the time he also had concomitant circumflex and circumflex and marginal disease which was not intervened upon.  He had felt significantly improved with reference to symptoms but over the past several weeks has noticed some recurrent chest pain and increasing shortness of breath.  He is now on treatment for pulmonary fibrosis.  Presently, I am recommending initiating isosorbide mononitrate 30 mg.  He has been on low-dose amlodipine at just 2.5 mg and I will further titrate his to titrate this to 5 mg.  He continues to be on metoprolol tartrate 25 mg twice a day.  He is on dual antiplatelet therapy with aspirin and Plavix.  He does have leg swelling and continues to be on torsemide 10 mg daily.  I am scheduling him for a follow-up echo Doppler study to reassess his systolic and diastolic function as well as valvular architecture.  He will be seeing Dr. Vaughan Browner who is now following his pulmonary fibrosis.  I will see him in 2 to 3 months for follow-up Cardiologic evaluation.  However if he develops increasing anginal symptomatology despite increased medical therapy repeat  catheterization with consideration of possible intervention to the circumflex vessel may be indicated.  Time spent: 30 minutes Troy Sine, MD, [facility]  05/10/2019 6:46 PM

## 2019-05-08 NOTE — Telephone Encounter (Signed)
Spoke with patient and let him know that the medications that he requested to be refilled has been refilled.

## 2019-05-09 NOTE — Telephone Encounter (Signed)
Called [employer] to check status of patient's application. Rep Jeneen Rinks advised that old Patient Consent form was used for patient and they need an updated one completed to move forward. They are sending a copy to the patient to complete and return. Called patient and he was aware and will reach out if he has any issues. Will follow up.  Phone# [phone]  11:01 AM Beatriz Chancellor, CPhT

## 2019-05-10 ENCOUNTER — Encounter: Payer: Self-pay | Admitting: Cardiovascular Disease

## 2019-05-15 ENCOUNTER — Other Ambulatory Visit: Payer: Self-pay

## 2019-05-15 ENCOUNTER — Ambulatory Visit (HOSPITAL_COMMUNITY)
Admission: RE | Admit: 2019-05-15 | Discharge: 2019-05-15 | Disposition: A | Discharge status: Home / Self Care | Payer: Medicare Other | Source: Ambulatory Visit | Attending: Cardiovascular Disease | Admitting: Cardiovascular Disease

## 2019-05-15 DIAGNOSIS — J8410 Pulmonary fibrosis, unspecified: Secondary | ICD-10-CM | POA: Insufficient documentation

## 2019-05-15 DIAGNOSIS — R0600 Dyspnea, unspecified: Secondary | ICD-10-CM | POA: Diagnosis not present

## 2019-05-15 DIAGNOSIS — R0609 Other forms of dyspnea: Secondary | ICD-10-CM

## 2019-05-15 LAB — MYOMARKER 3 PLUS PROFILE (RDL)

## 2019-05-15 NOTE — Progress Notes (Signed)
*  PRELIMINARY RESULTS* Echocardiogram 2D Echocardiogram has been performed.  Maurice Munoz 05/15/2019, 12:22 PM

## 2019-05-21 DIAGNOSIS — Z6835 Body mass index (BMI) 35.0-35.9, adult: Secondary | ICD-10-CM | POA: Diagnosis not present

## 2019-05-21 DIAGNOSIS — E6609 Other obesity due to excess calories: Secondary | ICD-10-CM | POA: Diagnosis not present

## 2019-05-21 DIAGNOSIS — I129 Hypertensive chronic kidney disease with stage 1 through stage 4 chronic kidney disease, or unspecified chronic kidney disease: Secondary | ICD-10-CM | POA: Diagnosis not present

## 2019-05-21 DIAGNOSIS — J449 Chronic obstructive pulmonary disease, unspecified: Secondary | ICD-10-CM | POA: Diagnosis not present

## 2019-05-21 DIAGNOSIS — N1831 Chronic kidney disease, stage 3a: Secondary | ICD-10-CM | POA: Diagnosis not present

## 2019-05-21 DIAGNOSIS — I2510 Atherosclerotic heart disease of native coronary artery without angina pectoris: Secondary | ICD-10-CM | POA: Diagnosis not present

## 2019-05-21 DIAGNOSIS — E7849 Other hyperlipidemia: Secondary | ICD-10-CM | POA: Diagnosis not present

## 2019-05-21 DIAGNOSIS — G894 Chronic pain syndrome: Secondary | ICD-10-CM | POA: Diagnosis not present

## 2019-05-23 NOTE — Telephone Encounter (Signed)
Called [employer] to follow up on patient consent form, they still have not received. They spoke to patient on Monday 05/21/19 and emailed him the form to sign and send back. Will follow up with patient on Friday.  Phone# [phone]  10:27 AM Beatriz Chancellor, CPhT

## 2019-05-25 NOTE — Telephone Encounter (Signed)
[facility] and patient consent letter still has not been received. Called patient, he mailed patient consent form yesterday, 05/24/19. Will call Celso Amy next week to confirm consent.  Phone# [phone]- [phone]

## 2019-06-01 ENCOUNTER — Other Ambulatory Visit: Payer: Self-pay | Admitting: Cardiovascular Disease

## 2019-06-01 NOTE — Telephone Encounter (Signed)
Patient's Es[place] consent letter has been faxed to [place]. Will follow up on patient status on Monday.  Phone# [phone]

## 2019-06-04 ENCOUNTER — Other Ambulatory Visit (HOSPITAL_COMMUNITY)
Admission: RE | Admit: 2019-06-04 | Discharge: 2019-06-04 | Disposition: A | Discharge status: Home / Self Care | Payer: Medicare Other | Source: Ambulatory Visit | Attending: Pulmonary Disease | Admitting: Pulmonary Disease

## 2019-06-04 ENCOUNTER — Ambulatory Visit: Payer: Medicare Other | Admitting: Pulmonary Disease

## 2019-06-04 DIAGNOSIS — Z01812 Encounter for preprocedural laboratory examination: Secondary | ICD-10-CM | POA: Diagnosis present

## 2019-06-04 DIAGNOSIS — Z20828 Contact with and (suspected) exposure to other viral communicable diseases: Secondary | ICD-10-CM | POA: Diagnosis not present

## 2019-06-04 LAB — SARS CORONAVIRUS 2 (TAT 6-24 HRS): SARS Coronavirus 2: NEGATIVE

## 2019-06-05 NOTE — Telephone Encounter (Signed)
[employer] [employer] to follow up on patient's status. Rep Daine Floras, they completed benefits review for Esbriet and patient's plan prefers [facility]. They spoke to patient on 12/14 to initiate patient assistance, but patient advised that he would prefer wife to be present when enrolling. They have a scheduled call on 12/17 with patient and wife.  Phone# [phone]- [phone]

## 2019-06-07 ENCOUNTER — Ambulatory Visit (HOSPITAL_COMMUNITY)
Admission: RE | Admit: 2019-06-07 | Discharge: 2019-06-07 | Disposition: A | Discharge status: Home / Self Care | Payer: Medicare Other | Source: Ambulatory Visit | Attending: Pulmonary Disease | Admitting: Pulmonary Disease

## 2019-06-07 ENCOUNTER — Other Ambulatory Visit: Payer: Self-pay

## 2019-06-07 DIAGNOSIS — J8410 Pulmonary fibrosis, unspecified: Secondary | ICD-10-CM | POA: Diagnosis present

## 2019-06-07 LAB — PULMONARY FUNCTION TEST
DL/VA % pred: 93 %
DL/VA: 3.74 ml/min/mmHg/L
DLCO unc % pred: 46 %
DLCO unc: 12.72 ml/min/mmHg
FEF 25-75 Post: 1.8 L/sec
FEF 25-75 Pre: 2.36 L/sec
FEF2575-%Change-Post: -23 %
FEF2575-%Pred-Post: 68 %
FEF2575-%Pred-Pre: 90 %
FEV1-%Change-Post: -5 %
FEV1-%Pred-Post: 56 %
FEV1-%Pred-Pre: 59 %
FEV1-Post: 1.96 L
FEV1-Pre: 2.07 L
FEV1FVC-%Change-Post: 0 %
FEV1FVC-%Pred-Pre: 113 %
FEV6-%Change-Post: -5 %
FEV6-%Pred-Post: 52 %
FEV6-%Pred-Pre: 55 %
FEV6-Post: 2.34 L
FEV6-Pre: 2.49 L
FEV6FVC-%Pred-Post: 105 %
FEV6FVC-%Pred-Pre: 105 %
FVC-%Change-Post: -5 %
FVC-%Pred-Post: 49 %
FVC-%Pred-Pre: 52 %
FVC-Post: 2.34 L
FVC-Pre: 2.49 L
Post FEV1/FVC ratio: 84 %
Post FEV6/FVC ratio: 100 %
Pre FEV1/FVC ratio: 83 %
Pre FEV6/FVC Ratio: 100 %
RV % pred: 48 %
RV: 1.26 L
TLC % pred: 47 %
TLC: 3.5 L

## 2019-06-07 MED ORDER — ALBUTEROL SULFATE (2.5 MG/3ML) 0.083% IN NEBU
2.5000 mg | INHALATION_SOLUTION | Freq: Once | RESPIRATORY_TRACT | Status: AC
  Administered 2019-06-07: 2.5 mg via RESPIRATORY_TRACT

## 2019-06-08 ENCOUNTER — Other Ambulatory Visit: Payer: Self-pay

## 2019-06-08 ENCOUNTER — Ambulatory Visit (INDEPENDENT_AMBULATORY_CARE_PROVIDER_SITE_OTHER): Payer: Medicare Other | Admitting: Pulmonary Disease

## 2019-06-08 ENCOUNTER — Encounter: Payer: Self-pay | Admitting: Pulmonary Disease

## 2019-06-08 VITALS — BP 122/78 | HR 70 | Temp 97.9°F | Ht 72.0 in | Wt 256.8 lb

## 2019-06-08 DIAGNOSIS — J8410 Pulmonary fibrosis, unspecified: Secondary | ICD-10-CM | POA: Diagnosis not present

## 2019-06-08 DIAGNOSIS — I2510 Atherosclerotic heart disease of native coronary artery without angina pectoris: Secondary | ICD-10-CM | POA: Diagnosis not present

## 2019-06-08 MED ORDER — ESBRIET 267 MG PO TABS: ORAL_TABLET | ORAL | 0 refills | Status: DC

## 2019-06-08 NOTE — Patient Instructions (Signed)
We will start supplemental oxygen as your O2 levels are low Use can stop Anoro as its not helping We will look at pharmacy to get you started on Esbriet Follow-up in 4 weeks.

## 2019-06-08 NOTE — Telephone Encounter (Signed)
Saw patient in office today and got him set up with [employer] Patient Assistance. Will complete and fax provider form to complete enrollment. Will call next week to follow up on 1st shipment.  Phone# [phone] Rep- Marzetta Board

## 2019-06-08 NOTE — Addendum Note (Signed)
Addended by: Mariella Saa C on: 06/08/2019 10:11 AM   Modules accepted: Orders

## 2019-06-08 NOTE — Progress Notes (Signed)
Maurice Munoz    [id number]    1946/04/22  Primary Care Physician:Fusco, Purcell Nails, MD  Referring Physician: Redmond School, MD [address] [place],  [place] [address]  Chief complaint: Follow-up for interstitial lung disease, idiopathic pulmonary fibrosis  HPI: 73 year old with history of coronary artery disease, pulmonary fibrosis.  Previously followed by [name].  Prior CT scan shows pulmonary fibrosis which is thought to be secondary to occupational exposure Referred back for worsening dyspnea.  He has had symptoms for the past several years but over few months he has noticed increasing dyspnea on exertion.  Unable to do minimal activities.  He was tested for Covid within the last month and was negative  Patient has established coronary artery disease status post multiple stenting in 2019.  He also has residual circumflex disease that was not intervened upon.  Pets: Dog.  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  No ongoing exposures.  No mold, hot tub, Jacuzzi or down pillows or comforters Smoking history: 75-pack-year smoker.  Quit in 1990 Travel history: No significant travel history Relevant family history: Father died of lung cancer.  He was a smoker.  Interim history: Continues to have significant dyspnea on exertion He is seen his cardiologist Dr. Claiborne Billings recently and was started on Imdur for angina symptoms.    He still working with the paperwork to get Esbriet started.  He needs patient assistance due to cost of medication  Outpatient Encounter Medications as of 06/08/2019  Medication Sig  . allopurinol (ZYLOPRIM) 300 MG tablet Take 300 mg by mouth every morning.   Marland Kitchen amLODipine (NORVASC) 5 MG tablet Take 1 tablet (5 mg total) by mouth daily.  Marland Kitchen aspirin EC 81 MG tablet Take 81 mg by mouth daily.  Marland Kitchen atorvastatin  (LIPITOR) 80 MG tablet TAKE ONE TABLET (80MG  TOTAL) BY MOUTH AT6PM  . benzonatate (TESSALON) 200 MG capsule TAKE ONE CAPSULE (200MG  TOTAL) BY MOUTH TWO TIMES DAILY AS NEEDED FOR COUGH  . cholecalciferol (VITAMIN D) 1000 units tablet Take 1,000 Units by mouth every morning.   . clopidogrel (PLAVIX) 75 MG tablet TAKE ONE (1) TABLET BY MOUTH EVERY DAY  . isosorbide mononitrate (IMDUR) 30 MG 24 hr tablet Take 1 tablet (30 mg total) by mouth daily.  . metoprolol tartrate (LOPRESSOR) 50 MG tablet TAKE ONE TABLET BY MOUTH TWICE A DAY  . oxyCODONE-acetaminophen (PERCOCET) 10-325 MG per tablet Take 1 tablet by mouth every 8 (eight) hours as needed for pain.   Marland Kitchen tiZANidine (ZANAFLEX) 4 MG tablet Take 4 mg by mouth at bedtime.   . torsemide (DEMADEX) 10 MG tablet Take 1 tablet (10 mg total) by mouth daily. MAY TAKE ADDITIONAL AS NEEDED FOR SWELLING  . vitamin B-12 (CYANOCOBALAMIN) 1000 MCG tablet Take 1,000 mcg by mouth daily.  . nitroGLYCERIN (NITROSTAT) 0.4 MG SL tablet Place 1 tablet (0.4 mg total) under the tongue every 5 (five) minutes as needed for chest pain.  Marland Kitchen umeclidinium-vilanterol (ANORO ELLIPTA) 62.5-25 MCG/INH AEPB Inhale 1 puff into the lungs daily. (Patient not taking: Reported on 06/08/2019)  . umeclidinium-vilanterol (ANORO ELLIPTA) 62.5-25 MCG/INH AEPB Inhale 1 puff into the lungs daily. (Patient not taking: Reported on 06/08/2019)   No facility-administered encounter medications on file as of 06/08/2019.   Physical Exam: Blood pressure 122/78, pulse 70, temperature 97.9 F (36.6 C), temperature source Temporal,  height 6' (1.829 m), weight 256 lb 12.8 oz (116.5 kg), SpO2 97 %. Gen:      No acute distress HEENT:  EOMI, sclera anicteric Neck:     No masses; no thyromegaly Lungs:    Bibasal crackles CV:         Regular rate and rhythm; no murmurs Abd:      + bowel sounds; soft, non-tender; no palpable masses, no distension Ext:    No edema; adequate peripheral perfusion Skin:      Warm  and dry; no rash Neuro: alert and oriented x 3 Psych: normal mood and affect  Data Reviewed: Imaging: CT high-resolution February 05, 2015 patchy areas of groundglass attenuation, septal thickening with mild basal gradient. CT chest high-resolution 05/09/2016-subpleural reticulation, mild traction bronchiolectasis, mild honeycombing.  Definite UIP pattern high-resolution  CT chest high-resolution 04/24/2019-progression of fibrotic lung lung disease and definite UIP pattern. I have reviewed the images personally.  PFTs: 04/29/2017 FVC 2.66 [55%], FEV1 2.35 [66%], F/F 88, TLC 4.10 [55%], DLCO 15.60 [44%] Moderate restriction with severe diffusion defect.  Worsening lung function compared to 2016.  05/28/2019 FVC 2.34 [49%], FEV1 1.96 [56%], F/F 84, TLC 3.50 [47%], DLCO 12.72 [46%] Severe restriction, diffusion defect.  Worsening lung volumes and diffusion capacity compared to 2008  Labs: CTD serologies 02/23/2017-negative ANA, Jo 1, centromere antibody, rheumatoid factor, Ro, La, SCL 70  Repeat CTD serologies-negative ANA, CCP CK, aldolase, myositis panel, SCL 70, Ro, La Rheumatoid factor 16  Assessment:  Idiopathic pulmonary fibrosis He has 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  Although there is some occupational exposures he has not had ongoing exposure since 2000.  He does not have significant exposure history or signs and symptoms of connective tissue disease.  CTD serologies significant for borderline rheumatoid factor which is nonsignificant This is likely idiopathic pulmonary fibrosis  Discussed diagnosis and treatment plan in detail with patient and wife and all patients answered We will start supplemental oxygen as he desatted yesterday and could not complete the 6-minute walk test He will need patient assistance for Esbriet.  Will work with pharmacy about this Stop Anoro as there is no evidence of COPD and its not helping  Plan/Recommendations: - Stop Anoro, paperwork for  [place] assistance - Start supplemental oxygen  Marshell Garfinkel MD [facility] 06/08/2019, 9:11 AM  CC: Redmond School, MD

## 2019-06-08 NOTE — Telephone Encounter (Signed)
Prescriber [employer] Form completed and faxed.     Mariella Saa, PharmD, [place], [place] Clinical Specialty Pharmacist [phone]  06/08/2019 10:17 AM

## 2019-06-12 NOTE — Telephone Encounter (Signed)
[place] Patient Assistance foundation to check patient status. Patient was Approved indefinitely (unless he has any significant changes) to receive Esbriet shipped to him free of charge. [facility] will reach out to patient within 24-48 hours to schedule shipment. Called patient to advise. He will reach out to the office with any questions or concerns.  Patient ID# [id number]  [facility]# [phone] Fax# [phone]  Celso Amy phone# [phone]  Prescriptions can be escribed.  10:55 AM Beatriz Chancellor, CPhT   .

## 2019-06-18 ENCOUNTER — Other Ambulatory Visit (HOSPITAL_COMMUNITY): Payer: Self-pay | Admitting: Physician Assistant

## 2019-06-21 DIAGNOSIS — N1831 Chronic kidney disease, stage 3a: Secondary | ICD-10-CM | POA: Diagnosis not present

## 2019-06-21 DIAGNOSIS — E7849 Other hyperlipidemia: Secondary | ICD-10-CM | POA: Diagnosis not present

## 2019-06-21 DIAGNOSIS — I2510 Atherosclerotic heart disease of native coronary artery without angina pectoris: Secondary | ICD-10-CM | POA: Diagnosis not present

## 2019-06-21 DIAGNOSIS — I129 Hypertensive chronic kidney disease with stage 1 through stage 4 chronic kidney disease, or unspecified chronic kidney disease: Secondary | ICD-10-CM | POA: Diagnosis not present

## 2019-07-03 ENCOUNTER — Ambulatory Visit: Payer: Self-pay | Admitting: Pulmonary Disease

## 2019-07-03 ENCOUNTER — Other Ambulatory Visit (HOSPITAL_COMMUNITY): Payer: Self-pay | Admitting: Cardiovascular Disease

## 2019-07-03 NOTE — Progress Notes (Signed)
Interstitial Lung Disease Multidisciplinary Conference   Maurice Munoz    MRN [id number]    DOB 1946/05/06  Primary Care Physician:Fusco, Purcell Nails, MD  Referring Physician: Marshell Garfinkel MD  Time of Conference: 7.30am- 8.30am Date of conference: 07/02/2018 Location of Conference: -  Virtual  Participating Pulmonary: Dr. Brand Males, MD,  Dr Marshell Garfinkel, MD Pathology: Dr Jaquita Folds, MD Radiology: Dr Eddie Candle Others:   Brief History:  74 year old with progressive pulmonary fibrosis and UIP pattern. Although there is some occupational exposures he has not had ongoing exposure since 2000.  He does not have significant exposure history or signs and symptoms of connective tissue disease.  CTD serologies significant for borderline rheumatoid factor which is nonsignificant  Serology:  CTD serologies 02/23/2017-negative ANA, Jo 1, centromere antibody, rheumatoid factor, Ro, La, SCL 70  Repeat CTD serologies-negative ANA, CCP CK, aldolase, myositis panel, SCL 70, Ro, La Rheumatoid factor 16  MDD discussion of CT scan: CT high-resolution February 05, 2015 patchy areas of groundglass attenuation, septal thickening with mild basal gradient. CT chest high-resolution 05/09/2016-subpleural reticulation, mild traction bronchiolectasis, mild honeycombing.  Definite UIP pattern high-resolution  CT chest high-resolution 04/24/2019-progression of fibrotic lung lung disease and definite UIP pattern.  Discussed CT in conference.  This is classic, definite UIP appearance with progression    Pathology discussion of biopsy: None  PFTs:  05/28/2019 FVC 2.34 [49%], FEV1 1.96 [56%], F/F 84, TLC 3.50 [47%], DLCO 12.72 [46%] Severe restriction, diffusion defect.  Worsening lung volumes and diffusion capacity compared to 2008  MDD Impression/Recs:  Definite UIP pattern on CT scan.  Occupational exposure, CTD serologies not felt to be significant  Diagnosis of IPF by  multidisciplinary conference consensus  Time Spent in preparation and discussion:  > 30 min    SIGNATURE   Marshell Garfinkel MD [facility]   07/06/2019, 10:04 AM  ...................................................................................................................Marland Kitchen References:  Diagnosis of Idiopathic Pulmonary Fibrosis. An Official ATS/ERS/JRS/ALAT Clinical Practice Guideline. Raghu G et al, [place]. 2018 Sep 1;198(5):e44-e68.   IPF Suspected   Histopath ology Pattern      UIP  Probable UIP  Indeterminate for  UIP  Alternative  diagnosis    UIP  IPF  IPF  IPF  Non-IPF dx   HRCT   Probabe UIP  IPF  IPF  IPF (Likely)**  Non-IPF dx  Pattern  Indeterminate for UIP  IPF  IPF (Likely)**  Indeterminate  for IPF**  Non-IPF dx    Alternative diagnosis  IPF (Likely)**/ non-IPF dx  Non-IPF dx  Non-IPF dx  Non-IPF dx     Idiopathic pulmonary fibrosis diagnosis based upon HRCT and Biopsy paterns.  ** IPF is the likely diagnosis when any of following features are present:  . Moderate-to-severe traction bronchiectasis/bronchiolectasis (defined as mild traction bronchiectasis/bronchiolectasis in four or more lobes including the lingual as a lobe, or moderate to severe traction bronchiectasis in two or more lobes) in a man over age 40 years or in a woman over age 13 years . Extensive (>30%) reticulation on HRCT and an age >70 years  . Increased neutrophils and/or absence of lymphocytosis in BAL fluid  . Multidisciplinary discussion reaches a confident diagnosis of IPF.   **Indeterminate for IPF  . Without an adequate biopsy is unlikely to be IPF  . With an adequate biopsy may be reclassified to a more specific diagnosis after multidisciplinary discussion and/or additional consultation.   dx = diagnosis; HRCT = high-resolution computed tomography; IPF =  idiopathic pulmonary fibrosis; UIP = usual interstitial  pneumonia.

## 2019-07-05 NOTE — Progress Notes (Addendum)
**Patient seen by the pharmacy team in conjunction with Dr. Vaughan Browner. Please refer to office visit encounter on 07/06/2019 for provider assessment and plan.**  Subjective:   Patient presents today to [facility] to see pharmacy team for 1 month follow up for [place]. Pertinent past medical history includes IPF, CAD, history of NSTEMI, HTN, hyperlipidemia and gout.  At last visit Anoro Ellipta was discontinued no improvement in symptoms and no evidence of COPD.  He was started on supplemental oxygen due to desaturation on last 6 min walk test.  Objective: Allergies  Allergen Reactions  . Ibuprofen Itching    Motrin brand  . Neosporin [Neomycin-Bacitracin Zn-Polymyx] Swelling  . Penicillins Rash     Has patient had a PCN reaction causing immediate rash, facial/tongue/throat swelling, SOB or lightheadedness with hypotension: No Has patient had a PCN reaction causing severe rash involving mucus membranes or skin necrosis: No Has patient had a PCN reaction that required hospitalization: No Has patient had a PCN reaction occurring within the last 10 years: No If all of the above answers are "NO", then may proceed with Cephalosporin use.     Outpatient Encounter Medications as of 07/06/2019  Medication Sig  . allopurinol (ZYLOPRIM) 300 MG tablet Take 300 mg by mouth every morning.   Marland Kitchen amLODipine (NORVASC) 5 MG tablet Take 1 tablet (5 mg total) by mouth daily.  Marland Kitchen aspirin EC 81 MG tablet Take 81 mg by mouth daily.  Marland Kitchen atorvastatin (LIPITOR) 80 MG tablet TAKE ONE TABLET (80MG  TOTAL) BY MOUTH AT6PM  . benzonatate (TESSALON) 200 MG capsule TAKE ONE CAPSULE (200MG  TOTAL) BY MOUTH TWO TIMES DAILY AS NEEDED FOR COUGH  . cholecalciferol (VITAMIN D) 1000 units tablet Take 1,000 Units by mouth every morning.   . clopidogrel (PLAVIX) 75 MG tablet TAKE ONE (1) TABLET BY MOUTH EVERY DAY  . isosorbide mononitrate (IMDUR) 30 MG 24 hr tablet Take 1 tablet (30 mg total) by mouth daily.  . metoprolol  tartrate (LOPRESSOR) 50 MG tablet Take 1 tablet (50 mg total) by mouth 2 (two) times daily.  . nitroGLYCERIN (NITROSTAT) 0.4 MG SL tablet Place 1 tablet (0.4 mg total) under the tongue every 5 (five) minutes as needed for chest pain.  Marland Kitchen oxyCODONE-acetaminophen (PERCOCET) 10-325 MG per tablet Take 1 tablet by mouth every 8 (eight) hours as needed for pain.   . Pirfenidone (ESBRIET) 267 MG TABS Take 1 tablet (267 mg total) by mouth 3 (three) times daily for 7 days, THEN 2 tablets (534 mg total) 3 (three) times daily for 7 days, THEN 3 tablets (801 mg total) 3 (three) times daily for 16 days.  Marland Kitchen tiZANidine (ZANAFLEX) 4 MG tablet Take 4 mg by mouth at bedtime.   . torsemide (DEMADEX) 10 MG tablet Take 1 tablet (10 mg total) by mouth daily. MAY TAKE ADDITIONAL AS NEEDED FOR SWELLING  . umeclidinium-vilanterol (ANORO ELLIPTA) 62.5-25 MCG/INH AEPB Inhale 1 puff into the lungs daily. (Patient not taking: Reported on 06/08/2019)  . umeclidinium-vilanterol (ANORO ELLIPTA) 62.5-25 MCG/INH AEPB Inhale 1 puff into the lungs daily. (Patient not taking: Reported on 06/08/2019)  . vitamin B-12 (CYANOCOBALAMIN) 1000 MCG tablet Take 1,000 mcg by mouth daily.   No facility-administered encounter medications on file as of 07/06/2019.     Immunization History  Administered Date(s) Administered  . Influenza, High Dose Seasonal PF 04/04/2017, 03/01/2019    Hepatic Function Panel     Component Value Date/Time   PROT 7.3 05/01/2019 1000   PROT 6.5 11/24/2015  1127   ALBUMIN 4.2 05/01/2019 1000   ALBUMIN 4.1 11/24/2015 1127   AST 23 05/01/2019 1000   ALT 15 05/01/2019 1000   ALKPHOS 90 05/01/2019 1000   BILITOT 2.0 (H) 05/01/2019 1000   BILITOT 1.0 11/24/2015 1127   BILIDIR 0.4 08/10/2014 0555   IBILI 1.9 (H) 08/10/2014 0555     Assessment and Plan  1. Esbriet Medication Management  Patient started Esbriet on 06/29/2018.  He has not experienced any side effects.  He states the biggest issue he has is taking  at lunch as he typically doesn't eat a meal.   He was approved for [place] indefinitely and receives medication from [facility].  Baseline LFT's on 05/01/2019 within normal limits.   Reviewed purpose, proper use, and potential adverse effects including nausea, vomiting, abdominal pain, GERD, weight loss, arthralgia, dizziness, and suns sensitivity/rash.  Advised to continue taking with food to minimize stomach upset and the importance of wearing sunscreen daily.  Patient verbalized understanding.  Stressed the importance of routine lab monitoring.  Will monitor LFT's monthly for the first six months then every 3 months after.  He will be due for labs around 2/8.  Advised that if he continues to tolerate Esbriet throughout initial taper we can switch to 801 mg tablet three times daily with meals.  Patient verbalized understanding.   2. Medication Reconciliation  A drug regimen assessment was performed, including review of allergies, interactions, disease-state management, dosing and immunization history. Medications were reviewed with the patient, including name, instructions, indication, goals of therapy, potential side effects, importance of adherence, and safe use.  3. Immunizations  He is up to date with annual influenza vaccine.  Patient states he previously received the shingles vaccine and pneumonia vaccine.  He may be eligible for another dose of Pneumovax 23.  Patient will obtain vaccine records from PCP and bring to his next follow up appointment.  All questions encouraged and answered.  Instructed patient to call with any questions or concerns.  This appointment required  25 minutes of patient care (this includes precharting, chart review, review of results, face-to-face care, etc.).   Mariella Saa, PharmD, [employer], [place] Clinical Specialty Pharmacist [phone]  07/06/2019 9:37 AM

## 2019-07-06 ENCOUNTER — Other Ambulatory Visit: Payer: Self-pay

## 2019-07-06 ENCOUNTER — Ambulatory Visit (INDEPENDENT_AMBULATORY_CARE_PROVIDER_SITE_OTHER): Payer: Medicare Other | Admitting: Pulmonary Disease

## 2019-07-06 ENCOUNTER — Encounter: Payer: Self-pay | Admitting: Pulmonary Disease

## 2019-07-06 ENCOUNTER — Ambulatory Visit (INDEPENDENT_AMBULATORY_CARE_PROVIDER_SITE_OTHER): Payer: Medicare Other | Admitting: Pharmacist

## 2019-07-06 VITALS — BP 120/70 | HR 70 | Temp 97.1°F | Ht 72.0 in | Wt 256.0 lb

## 2019-07-06 DIAGNOSIS — J8410 Pulmonary fibrosis, unspecified: Secondary | ICD-10-CM | POA: Diagnosis not present

## 2019-07-06 DIAGNOSIS — Z79899 Other long term (current) drug therapy: Secondary | ICD-10-CM

## 2019-07-06 NOTE — Patient Instructions (Signed)
I am glad you are doing well with regard to your breathing and you are tolerating the medication well Continue the current medication You are due for repeat labs around February 8 We will check you for a portable concentrator today.  Follow-up in clinic in 4 weeks.

## 2019-07-06 NOTE — Progress Notes (Signed)
Subjective:  Patient presents today to [facility] to see pharmacy team for 1 month follow up for [place]. Pertinent past medical history includes IPF, CAD, history of NSTEMI, HTN, hyperlipidemia and gout.  At last visit Anoro Ellipta was discontinued no improvement in symptoms and no evidence of COPD.  He was started on supplemental oxygen due to desaturation on last 6 min walk test.  Objective: Allergies  Allergen Reactions  . Ibuprofen Itching    Motrin brand  . Neosporin [Neomycin-Bacitracin Zn-Polymyx] Swelling  . Penicillins Rash     Has patient had a PCN reaction causing immediate rash, facial/tongue/throat swelling, SOB or lightheadedness with hypotension: No Has patient had a PCN reaction causing severe rash involving mucus membranes or skin necrosis: No Has patient had a PCN reaction that required hospitalization: No Has patient had a PCN reaction occurring within the last 10 years: No If all of the above answers are "NO", then may proceed with Cephalosporin use.     Outpatient Encounter Medications as of 07/06/2019  Medication Sig  . allopurinol (ZYLOPRIM) 300 MG tablet Take 300 mg by mouth every morning.   Marland Kitchen amLODipine (NORVASC) 5 MG tablet Take 1 tablet (5 mg total) by mouth daily.  Marland Kitchen aspirin EC 81 MG tablet Take 81 mg by mouth daily.  Marland Kitchen atorvastatin (LIPITOR) 80 MG tablet TAKE ONE TABLET (80MG  TOTAL) BY MOUTH AT6PM  . benzonatate (TESSALON) 200 MG capsule TAKE ONE CAPSULE (200MG  TOTAL) BY MOUTH TWO TIMES DAILY AS NEEDED FOR COUGH  . cholecalciferol (VITAMIN D) 1000 units tablet Take 1,000 Units by mouth every morning.   . clopidogrel (PLAVIX) 75 MG tablet TAKE ONE (1) TABLET BY MOUTH EVERY DAY  . isosorbide mononitrate (IMDUR) 30 MG 24 hr tablet Take 1 tablet (30 mg total) by mouth daily.  . metoprolol tartrate (LOPRESSOR) 50 MG tablet Take 1 tablet (50 mg total) by mouth 2 (two) times daily.  . nitroGLYCERIN (NITROSTAT) 0.4 MG SL tablet Place 1 tablet (0.4 mg  total) under the tongue every 5 (five) minutes as needed for chest pain.  Marland Kitchen oxyCODONE-acetaminophen (PERCOCET) 10-325 MG per tablet Take 1 tablet by mouth every 8 (eight) hours as needed for pain.   . Pirfenidone (ESBRIET) 267 MG TABS Take 1 tablet (267 mg total) by mouth 3 (three) times daily for 7 days, THEN 2 tablets (534 mg total) 3 (three) times daily for 7 days, THEN 3 tablets (801 mg total) 3 (three) times daily for 16 days.  Marland Kitchen tiZANidine (ZANAFLEX) 4 MG tablet Take 4 mg by mouth at bedtime.   . torsemide (DEMADEX) 10 MG tablet Take 1 tablet (10 mg total) by mouth daily. MAY TAKE ADDITIONAL AS NEEDED FOR SWELLING  . umeclidinium-vilanterol (ANORO ELLIPTA) 62.5-25 MCG/INH AEPB Inhale 1 puff into the lungs daily. (Patient not taking: Reported on 06/08/2019)  . umeclidinium-vilanterol (ANORO ELLIPTA) 62.5-25 MCG/INH AEPB Inhale 1 puff into the lungs daily. (Patient not taking: Reported on 06/08/2019)  . vitamin B-12 (CYANOCOBALAMIN) 1000 MCG tablet Take 1,000 mcg by mouth daily.   No facility-administered encounter medications on file as of 07/06/2019.     Immunization History  Administered Date(s) Administered  . Influenza, High Dose Seasonal PF 04/04/2017, 03/01/2019    Hepatic Function Panel     Component Value Date/Time   PROT 7.3 05/01/2019 1000   PROT 6.5 11/24/2015 1127   ALBUMIN 4.2 05/01/2019 1000   ALBUMIN 4.1 11/24/2015 1127   AST 23 05/01/2019 1000   ALT 15 05/01/2019 1000  ALKPHOS 90 05/01/2019 1000   BILITOT 2.0 (H) 05/01/2019 1000   BILITOT 1.0 11/24/2015 1127   BILIDIR 0.4 08/10/2014 0555   IBILI 1.9 (H) 08/10/2014 0555     Assessment and Plan  1. Esbriet Medication Management  Patient started Esbriet on 06/29/2018.  He has not experienced any side effects.  He states the biggest issue he has is taking at lunch as he typically doesn't eat a meal.   He was approved for [place] indefinitely and receives medication from [facility].  Baseline LFT's on 05/01/2019 within normal limits.   Reviewed purpose, proper use, and potential adverse effects including nausea, vomiting, abdominal pain, GERD, weight loss, arthralgia, dizziness, and suns sensitivity/rash.  Advised to continue taking with food to minimize stomach upset and the importance of wearing sunscreen daily.  Patient verbalized understanding.  Stressed the importance of routine lab monitoring.  Will monitor LFT's monthly for the first six months then every 3 months after.  He will be due for labs around 2/8.  Advised that if he continues to tolerate Esbriet throughout initial taper we can switch to 801 mg tablet three times daily with meals.  Patient verbalized understanding.  2. Medication Reconciliation  A drug regimen assessment was performed, including review of allergies, interactions, disease-state management, dosing and immunization history. Medications were reviewed with the patient, including name, instructions, indication, goals of therapy, potential side effects, importance of adherence, and safe use.  3. Immunizations  He is up to date with annual influenza vaccine.  Patient states he previously received the shingles vaccine and pneumonia vaccine.  He may be eligible for another dose of Pneumovax 23.  Patient will obtain vaccine records from PCP and bring to his next follow up appointment.  All questions encouraged and answered.  Instructed patient to call with any questions or concerns.  This appointment required  25 minutes of patient care (this includes precharting, chart review, review of results, face-to-face care, etc.).   Mariella Saa, PharmD, [place], [place] Clinical Specialty Pharmacist [phone]

## 2019-07-06 NOTE — Progress Notes (Signed)
Maurice Munoz    PL:[id number]    January 09, 1946  Primary Care Physician:Fusco, Purcell Nails, MD  Referring Physician: Redmond School, MD [address] [place],  [place] [address]  Chief complaint: Follow-up for interstitial lung disease, idiopathic pulmonary fibrosis  HPI: 74 year old with history of coronary artery disease, pulmonary fibrosis.  Previously followed by [name].  Prior CT scan shows pulmonary fibrosis which is thought to be secondary to occupational exposure Referred back for worsening dyspnea.  He has had symptoms for the past several years but over few months he has noticed increasing dyspnea on exertion.  Unable to do minimal activities.  He was tested for Covid within the last month and was negative  Patient has established coronary artery disease status post multiple stenting in 2019.  He also has residual circumflex disease that was not intervened upon.  Pets: Dog.  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  No ongoing exposures.  No mold, hot tub, Jacuzzi or down pillows or comforters Smoking history: 75-pack-year smoker.  Quit in 1990 Travel history: No significant travel history Relevant family history: Father died of lung cancer.  He was a smoker.  Interim history: Started on Esbriet in early January 2020.  He is tolerating it well so far Continues to have dyspnea on exertion Discussed at multidisciplinary conference on 1/12  Outpatient Encounter Medications as of 07/06/2019  Medication Sig  . allopurinol (ZYLOPRIM) 300 MG tablet Take 300 mg by mouth every morning.   Marland Kitchen amLODipine (NORVASC) 5 MG tablet Take 1 tablet (5 mg total) by mouth daily.  Marland Kitchen aspirin EC 81 MG tablet Take 81 mg by mouth daily.  Marland Kitchen atorvastatin (LIPITOR) 80 MG tablet TAKE ONE TABLET (80MG  TOTAL) BY MOUTH AT6PM  . b complex vitamins tablet Take 1  tablet by mouth daily.  . cholecalciferol (VITAMIN D) 1000 units tablet Take 5,000 Units by mouth every morning.   . clopidogrel (PLAVIX) 75 MG tablet TAKE ONE (1) TABLET BY MOUTH EVERY DAY  . isosorbide mononitrate (IMDUR) 30 MG 24 hr tablet Take 1 tablet (30 mg total) by mouth daily.  . metoprolol tartrate (LOPRESSOR) 50 MG tablet Take 1 tablet (50 mg total) by mouth 2 (two) times daily.  . nitroGLYCERIN (NITROSTAT) 0.4 MG SL tablet Place 1 tablet (0.4 mg total) under the tongue every 5 (five) minutes as needed for chest pain.  Marland Kitchen oxyCODONE-acetaminophen (PERCOCET) 10-325 MG per tablet Take 1 tablet by mouth every 8 (eight) hours as needed for pain.   . Pirfenidone (ESBRIET) 267 MG TABS Take 1 tablet (267 mg total) by mouth 3 (three) times daily for 7 days, THEN 2 tablets (534 mg total) 3 (three) times daily for 7 days, THEN 3 tablets (801 mg total) 3 (three) times daily for 16 days.  Marland Kitchen tiZANidine (ZANAFLEX) 4 MG tablet Take 4 mg by mouth at bedtime.   . torsemide (DEMADEX) 10 MG tablet Take 1 tablet (10 mg total) by mouth daily. MAY TAKE ADDITIONAL AS NEEDED FOR SWELLING  . [DISCONTINUED] vitamin B-12 (CYANOCOBALAMIN) 1000 MCG tablet Take 1,000 mcg by mouth daily.  . benzonatate (TESSALON) 200 MG capsule TAKE ONE CAPSULE (200MG  TOTAL) BY MOUTH TWO TIMES DAILY AS NEEDED FOR COUGH (Patient not taking: Reported on 07/06/2019)   No facility-administered encounter medications on file as of 07/06/2019.   Physical Exam: Blood pressure 120/70,  pulse 70, temperature (!) 97.1 F (36.2 C), temperature source Temporal, height 6' (1.829 m), weight 256 lb (116.1 kg), SpO2 98 %. Gen:      No acute distress HEENT:  EOMI, sclera anicteric Neck:     No masses; no thyromegaly Lungs:    Bibasal crackles CV:         Regular rate and rhythm; no murmurs Abd:      + bowel sounds; soft, non-tender; no palpable masses, no distension Ext:    No edema; adequate peripheral perfusion Skin:      Warm and dry; no  rash Neuro: alert and oriented x 3 Psych: normal mood and affect  Data Reviewed: Imaging: CT high-resolution February 05, 2015 patchy areas of groundglass attenuation, septal thickening with mild basal gradient. CT chest high-resolution 05/09/2016-subpleural reticulation, mild traction bronchiolectasis, mild honeycombing.  Definite UIP pattern high-resolution  CT chest high-resolution 04/24/2019-progression of fibrotic lung lung disease and definite UIP pattern. I have reviewed the images personally.  PFTs: 04/29/2017 FVC 2.66 [55%], FEV1 2.35 [66%], F/F 88, TLC 4.10 [55%], DLCO 15.60 [44%] Moderate restriction with severe diffusion defect.  Worsening lung function compared to 2016.  05/28/2019 FVC 2.34 [49%], FEV1 1.96 [56%], F/F 84, TLC 3.50 [47%], DLCO 12.72 [46%] Severe restriction, diffusion defect.  Worsening lung volumes and diffusion capacity compared to 2008  Labs: CTD serologies 02/23/2017-negative ANA, Jo 1, centromere antibody, rheumatoid factor, Ro, La, SCL 70  Repeat CTD serologies-negative ANA, CCP CK, aldolase, myositis panel, SCL 70, Ro, La Rheumatoid factor 16  Assessment:  Idiopathic pulmonary fibrosis He has 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  Although there is some occupational exposures he has not had ongoing exposure since 2000.  He does not have significant exposure history or signs and symptoms of connective tissue disease.  CTD serologies significant for borderline rheumatoid factor which is nonsignificant  Discussed at multidisciplinary conference diagnosis of IPF Continue Esbriet.  Uptitrate as tolerated Pharmacy visit today to review medication.  Check hepatic panel for monitoring.  Plan/Recommendations: - Continue esbriet. - Hepatic panel - Supplemental oxygen  Marshell Garfinkel MD [facility] 07/06/2019, 2:01 PM  CC: Redmond School, MD

## 2019-07-09 ENCOUNTER — Telehealth: Payer: Self-pay | Admitting: Pulmonary Disease

## 2019-07-09 NOTE — Telephone Encounter (Signed)
Spoke with Danae Chen at [facility]. She had some questions about how many liters of oxygen the pt needed to be using. This information has been given to [place]. Nothing further was needed.

## 2019-07-19 DIAGNOSIS — G894 Chronic pain syndrome: Secondary | ICD-10-CM | POA: Diagnosis not present

## 2019-07-19 DIAGNOSIS — Z6835 Body mass index (BMI) 35.0-35.9, adult: Secondary | ICD-10-CM | POA: Diagnosis not present

## 2019-07-19 DIAGNOSIS — E6609 Other obesity due to excess calories: Secondary | ICD-10-CM | POA: Diagnosis not present

## 2019-07-19 DIAGNOSIS — M48061 Spinal stenosis, lumbar region without neurogenic claudication: Secondary | ICD-10-CM | POA: Diagnosis not present

## 2019-07-22 DIAGNOSIS — N1831 Chronic kidney disease, stage 3a: Secondary | ICD-10-CM | POA: Diagnosis not present

## 2019-07-22 DIAGNOSIS — I2510 Atherosclerotic heart disease of native coronary artery without angina pectoris: Secondary | ICD-10-CM | POA: Diagnosis not present

## 2019-07-22 DIAGNOSIS — I129 Hypertensive chronic kidney disease with stage 1 through stage 4 chronic kidney disease, or unspecified chronic kidney disease: Secondary | ICD-10-CM | POA: Diagnosis not present

## 2019-07-22 DIAGNOSIS — E7849 Other hyperlipidemia: Secondary | ICD-10-CM | POA: Diagnosis not present

## 2019-07-30 ENCOUNTER — Telehealth: Payer: Self-pay | Admitting: Pulmonary Disease

## 2019-07-30 DIAGNOSIS — J8410 Pulmonary fibrosis, unspecified: Secondary | ICD-10-CM

## 2019-07-30 DIAGNOSIS — Z5181 Encounter for therapeutic drug level monitoring: Secondary | ICD-10-CM

## 2019-07-30 NOTE — Telephone Encounter (Signed)
LMTCB x1 for pt's wife. Need to clarify what specialty pharmacy the medication needs to go to.

## 2019-07-31 NOTE — Telephone Encounter (Signed)
SPoke with pt;s wife and states pt needs a refill for Esbriet. From the chart it looks like pt needed to come in to have LFT's drawn on 07/30/2019. Does pt need the labs before we can refill? Please clarify the dosage and frequency of the esbriet. The wife stated he was now taking 3 tabs TID. Please advise.    267 3 tabs TID

## 2019-08-01 ENCOUNTER — Other Ambulatory Visit: Payer: Medicare Other

## 2019-08-01 DIAGNOSIS — J8410 Pulmonary fibrosis, unspecified: Secondary | ICD-10-CM

## 2019-08-01 LAB — HEPATIC FUNCTION PANEL
ALT: 18 U/L (ref 0–53)
AST: 22 U/L (ref 0–37)
Albumin: 4 g/dL (ref 3.5–5.2)
Alkaline Phosphatase: 109 U/L (ref 39–117)
Bilirubin, Direct: 0.3 mg/dL (ref 0.0–0.3)
Total Bilirubin: 1.4 mg/dL — ABNORMAL HIGH (ref 0.2–1.2)
Total Protein: 7.1 g/dL (ref 6.0–8.3)

## 2019-08-01 MED ORDER — ESBRIET 267 MG PO CAPS: 3.0000 | ORAL_CAPSULE | Freq: Three times a day (TID) | ORAL | 0 refills | Status: DC

## 2019-08-01 NOTE — Addendum Note (Signed)
Addended by: Valerie Salts on: 08/01/2019 04:53 PM   Modules accepted: Orders

## 2019-08-01 NOTE — Telephone Encounter (Signed)
Checked pt's chart and labwork has come back. Sarah, please advise.

## 2019-08-01 NOTE — Telephone Encounter (Signed)
LFT's are within normal range. It is ok to send prescription for Esbriet in for the patient.  3 tablets (801 mg total) 3 (three) times daily Send in enough for a month. He needs LFT's again in 1 month. Please place order for LFT's in 1 month. Thanks

## 2019-08-01 NOTE — Telephone Encounter (Signed)
Pt. Needs to come in for LFT's. Once I see the LFT's are WNL I will send in prescription. Thanks

## 2019-08-01 NOTE — Telephone Encounter (Signed)
Thanks, please let me know if the labs are resulted so we can send in the prescription if they are normal

## 2019-08-01 NOTE — Telephone Encounter (Signed)
Spoke with Diane and the patient. They verbalized understanding of results and recommendations. She stated that pharmacy was located in [place]. I reviewed his chart and it looks like the original RX was sent to [facility] in [place]. Will go ahead and send in the RX. Lab order has been placed.   Nothing further needed at time of call.

## 2019-08-01 NOTE — Telephone Encounter (Signed)
Message routed to app of the day, Eric Form, NP.  Sarah, I called the patient to make him aware the labs were needed before the prescription can be issued.  The patient stated Dr. Vaughan Browner told him he could go to [place] to have the labwork done and went to the one in the same building as his PCP office [facility] on 07/30/19. I told the patient I would check to see if it was done and call him back.  The labs were not showing as collected in our system. I checked with our lab tech Benay Spice) and she was not able to see the lab for hepatic panel was drawn because that location is on a separate system.  I called [facility] and was given [phone] as the contact number for the [facility] next door. When I called [facility] they stated they pulled labs based on his standing orders and a MyoMarker 3 was drawn. I advised that was an order from another provider (cardiology) and the patient went to their location thinking he was getting the hepatic panel done.  [facility] rep confirmed they did not draw the hepatic panel. She asked about cancelling the MyoMarker 3. I told her that based on the November 2020 order, that is something required by another provider and I was not in a position to tell them to cancel it, when she stated there were standing orders for it to be done.  I called the patient back to let him know the hepatic was not drawn. And advised that we cannot issue the refill until the lab ordered by Dr. Vaughan Browner has been drawn and resulted. The patient will go to our [place] location this afternoon.

## 2019-08-01 NOTE — Telephone Encounter (Signed)
Message will be routed to APP of the day as Dr. Vaughan Browner is working night shift this week.

## 2019-08-02 DIAGNOSIS — Z23 Encounter for immunization: Secondary | ICD-10-CM | POA: Diagnosis not present

## 2019-08-09 ENCOUNTER — Ambulatory Visit: Payer: Medicare Other | Admitting: Cardiovascular Disease

## 2019-08-09 LAB — MYOMARKER 3 PLUS PROFILE (RDL)

## 2019-08-13 ENCOUNTER — Telehealth: Payer: Self-pay | Admitting: Pulmonary Disease

## 2019-08-13 NOTE — Telephone Encounter (Signed)
Spoke with the pt  He thought had appt today  Nothing was ever scheduled  I offered appt with APP- refused  He was advised Dr Vaughan Browner could see him today, however may have to wait until end of clinic per Dr Vaughan Browner  He refused to wait and rescheduled for 08/16/19 at 3:30 pm   He states still has not received Esbriet rx  We sent this to [facility] pharm 08/01/19  Called [facility] at [phone]  Was advised that they have the order that we sent and are working on reaching out to the pt to schedule delivery  She states to speed up process he can call them to schedule this at [phone]  Spoke with the pt and notified of this and he verbalized understanding  Nothing further needed

## 2019-08-16 ENCOUNTER — Ambulatory Visit (INDEPENDENT_AMBULATORY_CARE_PROVIDER_SITE_OTHER): Payer: Medicare Other | Admitting: Pulmonary Disease

## 2019-08-16 ENCOUNTER — Other Ambulatory Visit: Payer: Self-pay

## 2019-08-16 ENCOUNTER — Encounter: Payer: Self-pay | Admitting: Pulmonary Disease

## 2019-08-16 VITALS — BP 126/80 | HR 80 | Temp 97.9°F | Ht 72.0 in | Wt 254.0 lb

## 2019-08-16 DIAGNOSIS — J84112 Idiopathic pulmonary fibrosis: Secondary | ICD-10-CM

## 2019-08-16 DIAGNOSIS — G894 Chronic pain syndrome: Secondary | ICD-10-CM | POA: Diagnosis not present

## 2019-08-16 DIAGNOSIS — Z6835 Body mass index (BMI) 35.0-35.9, adult: Secondary | ICD-10-CM | POA: Diagnosis not present

## 2019-08-16 DIAGNOSIS — E6609 Other obesity due to excess calories: Secondary | ICD-10-CM | POA: Diagnosis not present

## 2019-08-16 DIAGNOSIS — Z5181 Encounter for therapeutic drug level monitoring: Secondary | ICD-10-CM | POA: Diagnosis not present

## 2019-08-16 DIAGNOSIS — Z79899 Other long term (current) drug therapy: Secondary | ICD-10-CM

## 2019-08-16 NOTE — Progress Notes (Signed)
Maurice Munoz    [id number]    1946-02-22  Primary Care Physician:Fusco, Purcell Nails, MD  Referring Physician: Redmond School, MD [address] [place],  [place] [address]  Chief complaint: Follow-up for interstitial lung disease, idiopathic pulmonary fibrosis  HPI: 74 year old with history of coronary artery disease, pulmonary fibrosis.  Previously followed by [name].  Prior CT scan shows pulmonary fibrosis which is thought to be secondary to occupational exposure Referred back for worsening dyspnea.  He has had symptoms for the past several years but over few months he has noticed increasing dyspnea on exertion.  Unable to do minimal activities.  He was tested for Covid within the last month and was negative  Patient has established coronary artery disease status post multiple stenting in 2019.  He also has residual circumflex disease that was not intervened upon.  Discussed at multidisciplinary conference on 07/03/19 with diagnosis of IPF and started on Esbriet  Pets: Dog.  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  No ongoing exposures.  No mold, hot tub, Jacuzzi or down pillows or comforters Smoking history: 75-pack-year smoker.  Quit in 1990 Travel history: No significant travel history Relevant family history: Father died of lung cancer.  He was a smoker.  Interim history: Started on Esbriet in early January 2020.  He has tolerated up titration to 3 tablets 3 times daily He has not had any Esbriet this month due to problems with pharmacy and shipment of medication.  He is due to resume it in early March after recent treatment LFTs are normal Continues to have chronic dyspnea on exertion without change.  Outpatient Encounter Medications as of 08/16/2019  Medication Sig  . allopurinol (ZYLOPRIM) 300 MG tablet Take 300 mg by mouth  every morning.   Marland Kitchen amLODipine (NORVASC) 5 MG tablet Take 1 tablet (5 mg total) by mouth daily.  Marland Kitchen aspirin EC 81 MG tablet Take 81 mg by mouth daily.  Marland Kitchen atorvastatin (LIPITOR) 80 MG tablet TAKE ONE TABLET (80MG  TOTAL) BY MOUTH AT6PM  . b complex vitamins tablet Take 1 tablet by mouth daily.  . benzonatate (TESSALON) 200 MG capsule TAKE ONE CAPSULE (200MG  TOTAL) BY MOUTH TWO TIMES DAILY AS NEEDED FOR COUGH  . cholecalciferol (VITAMIN D) 1000 units tablet Take 5,000 Units by mouth every morning.   . clopidogrel (PLAVIX) 75 MG tablet TAKE ONE (1) TABLET BY MOUTH EVERY DAY  . isosorbide mononitrate (IMDUR) 30 MG 24 hr tablet Take 1 tablet (30 mg total) by mouth daily.  . metoprolol tartrate (LOPRESSOR) 50 MG tablet Take 1 tablet (50 mg total) by mouth 2 (two) times daily.  Marland Kitchen oxyCODONE-acetaminophen (PERCOCET) 10-325 MG per tablet Take 1 tablet by mouth every 8 (eight) hours as needed for pain.   . Pirfenidone (ESBRIET) 267 MG CAPS Take 3 capsules by mouth 3 (three) times daily.  Marland Kitchen tiZANidine (ZANAFLEX) 4 MG tablet Take 4 mg by mouth at bedtime.   . torsemide (DEMADEX) 10 MG tablet Take 1 tablet (10 mg total) by mouth daily. MAY TAKE ADDITIONAL AS NEEDED FOR SWELLING  . nitroGLYCERIN (NITROSTAT) 0.4 MG SL tablet Place 1 tablet (0.4 mg total) under the tongue every 5 (five) minutes as needed for chest pain.   No facility-administered encounter medications on file as of 08/16/2019.   Physical Exam: Blood pressure 126/80, pulse 80, temperature 97.9  F (36.6 C), temperature source Temporal, height 6' (1.829 m), weight 254 lb (115.2 kg), SpO2 99 %. Gen:      No acute distress HEENT:  EOMI, sclera anicteric Neck:     No masses; no thyromegaly Lungs:    Bibasal crackles CV:         Regular rate and rhythm; no murmurs Abd:      + bowel sounds; soft, non-tender; no palpable masses, no distension Ext:    No edema; adequate peripheral perfusion Skin:      Warm and dry; no rash Neuro: alert and oriented x  3 Psych: normal mood and affect  Data Reviewed: Imaging: CT high-resolution February 05, 2015 patchy areas of groundglass attenuation, septal thickening with mild basal gradient. CT chest high-resolution 05/09/2016-subpleural reticulation, mild traction bronchiolectasis, mild honeycombing.  Definite UIP pattern high-resolution  CT chest high-resolution 04/24/2019-progression of fibrotic lung lung disease and definite UIP pattern. I have reviewed the images personally.  PFTs: 04/29/2017 FVC 2.66 [55%], FEV1 2.35 [66%], F/F 88, TLC 4.10 [55%], DLCO 15.60 [44%] Moderate restriction with severe diffusion defect.  Worsening lung function compared to 2016.  05/28/2019 FVC 2.34 [49%], FEV1 1.96 [56%], F/F 84, TLC 3.50 [47%], DLCO 12.72 [46%] Severe restriction, diffusion defect.  Worsening lung volumes and diffusion capacity compared to 2008  Labs: CTD serologies 02/23/2017-negative ANA, Jo 1, centromere antibody, rheumatoid factor, Ro, La, SCL 70  Repeat CTD serologies-negative ANA, CCP CK, aldolase, myositis panel, SCL 70, Ro, La Rheumatoid factor 16  Hepatic panel 08/01/2019-within normal limits except for total bilirubin of 1.4  Assessment:  Idiopathic pulmonary fibrosis He has 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  Although there is some occupational exposures he has not had ongoing exposure since 2000.  He does not have significant exposure history or signs and symptoms of connective tissue disease.  CTD serologies significant for borderline rheumatoid factor which is nonsignificant  Discussed at multidisciplinary conference diagnosis of IPF Continue Esbriet.  Unfortunately he had an interruption due to mixup at the pharmacy and is due to resume Esbriet soon Follow-up in 1 month with hepatic panel  Plan/Recommendations: - Continue esbriet. - Follow-up in 1 month - Supplemental oxygen   Marshell Garfinkel MD [facility] 08/16/2019, 3:50 PM  CC: Redmond School, MD

## 2019-08-16 NOTE — Patient Instructions (Signed)
Glad you are doing well with your breathing Sorry for the mixup with your appointment and the pharmacy refill on esbriet.  Resume the Esbriet when you get it next week Follow-up in 1 month with either me or nurse practitioner

## 2019-08-19 DIAGNOSIS — I2510 Atherosclerotic heart disease of native coronary artery without angina pectoris: Secondary | ICD-10-CM | POA: Diagnosis not present

## 2019-08-19 DIAGNOSIS — I129 Hypertensive chronic kidney disease with stage 1 through stage 4 chronic kidney disease, or unspecified chronic kidney disease: Secondary | ICD-10-CM | POA: Diagnosis not present

## 2019-08-19 DIAGNOSIS — E7849 Other hyperlipidemia: Secondary | ICD-10-CM | POA: Diagnosis not present

## 2019-08-19 DIAGNOSIS — N1831 Chronic kidney disease, stage 3a: Secondary | ICD-10-CM | POA: Diagnosis not present

## 2019-08-31 DIAGNOSIS — Z23 Encounter for immunization: Secondary | ICD-10-CM | POA: Diagnosis not present

## 2019-09-13 DIAGNOSIS — E6609 Other obesity due to excess calories: Secondary | ICD-10-CM | POA: Diagnosis not present

## 2019-09-13 DIAGNOSIS — G894 Chronic pain syndrome: Secondary | ICD-10-CM | POA: Diagnosis not present

## 2019-09-13 DIAGNOSIS — Z1389 Encounter for screening for other disorder: Secondary | ICD-10-CM | POA: Diagnosis not present

## 2019-09-13 DIAGNOSIS — Z6834 Body mass index (BMI) 34.0-34.9, adult: Secondary | ICD-10-CM | POA: Diagnosis not present

## 2019-09-14 ENCOUNTER — Other Ambulatory Visit: Payer: Self-pay

## 2019-09-14 ENCOUNTER — Encounter: Payer: Self-pay | Admitting: Pulmonary Disease

## 2019-09-14 ENCOUNTER — Telehealth: Payer: Self-pay

## 2019-09-14 ENCOUNTER — Telehealth: Payer: Self-pay | Admitting: Pulmonary Disease

## 2019-09-14 ENCOUNTER — Ambulatory Visit (INDEPENDENT_AMBULATORY_CARE_PROVIDER_SITE_OTHER): Payer: Medicare Other | Admitting: Pulmonary Disease

## 2019-09-14 VITALS — BP 120/70 | HR 68 | Temp 97.9°F | Ht 72.0 in | Wt 248.0 lb

## 2019-09-14 DIAGNOSIS — J84112 Idiopathic pulmonary fibrosis: Secondary | ICD-10-CM

## 2019-09-14 LAB — HEPATIC FUNCTION PANEL
ALT: 20 U/L (ref 0–53)
AST: 24 U/L (ref 0–37)
Albumin: 4.2 g/dL (ref 3.5–5.2)
Alkaline Phosphatase: 103 U/L (ref 39–117)
Bilirubin, Direct: 0.3 mg/dL (ref 0.0–0.3)
Total Bilirubin: 1.1 mg/dL (ref 0.2–1.2)
Total Protein: 7.2 g/dL (ref 6.0–8.3)

## 2019-09-14 MED ORDER — ESBRIET 801 MG PO TABS: 1.0000 | ORAL_TABLET | Freq: Three times a day (TID) | ORAL | 2 refills | Status: DC

## 2019-09-14 NOTE — Patient Instructions (Signed)
Continue the Esbriet We will check hepatic panel today I will check with the pharmacist to make sure that there is uninterrupted supply of modification Follow-up in 1 month

## 2019-09-14 NOTE — Addendum Note (Signed)
Addended by: Hildred Alamin I on: 09/14/2019 11:55 AM   Modules accepted: Orders

## 2019-09-14 NOTE — Addendum Note (Signed)
Addended by: Suzzanne Cloud E on: 09/14/2019 11:59 AM   Modules accepted: Orders

## 2019-09-14 NOTE — Telephone Encounter (Signed)
Patient was seen in the office today. He is having trouble getting is Esbriet filled on time. He went a month without it. Can you guys please reach out to him and help him get this straighten out. Thank you.

## 2019-09-14 NOTE — Telephone Encounter (Signed)
Addressed in another encounter.  Nothing further needed.   Mariella Saa, PharmD, [place], [place] [facility]ty Pharmacist [phone]  09/14/2019 2:32 PM

## 2019-09-14 NOTE — Addendum Note (Signed)
Addended by: Mariella Saa C on: 09/14/2019 02:39 PM   Modules accepted: Orders

## 2019-09-14 NOTE — Progress Notes (Signed)
Maurice Munoz    [id number]    1946/01/14  Primary Care Physician:Fusco, Purcell Nails, MD  Referring Physician: Redmond School, MD [address] [place],  [place] [address]  Chief complaint: Follow-up for interstitial lung disease, idiopathic pulmonary fibrosis  HPI: 74 year old with history of coronary artery disease, pulmonary fibrosis.  Previously followed by [name].  Prior CT scan shows pulmonary fibrosis which is thought to be secondary to occupational exposure Referred back for worsening dyspnea.  He has had symptoms for the past several years but over few months he has noticed increasing dyspnea on exertion.  Unable to do minimal activities.  He was tested for Covid within the last month and was negative  Patient has established coronary artery disease status post multiple stenting in 2019.  He also has residual circumflex disease that was not intervened upon.  Discussed at multidisciplinary conference on 07/03/19 with diagnosis of IPF and started on Esbriet  Pets: Dog.  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  No ongoing exposures.  No mold, hot tub, Jacuzzi or down pillows or comforters Smoking history: 75-pack-year smoker.  Quit in 1990 Travel history: No significant travel history Relevant family history: Father died of lung cancer.  He was a smoker.  Interim history: Started on Esbriet in early January 2020.  He has tolerated up titration to 3 tablets 3 times daily Continues to have issues with regular delivery of medication from pharmacy.  He did not get his medications in February.  He got 1 month supply in March but is due to run out of it at the end of this month with no refills received yet. LFTs are normal Continues to have chronic dyspnea on exertion without change.  Outpatient Encounter Medications as of 09/14/2019    Medication Sig  . allopurinol (ZYLOPRIM) 300 MG tablet Take 300 mg by mouth every morning.   Marland Kitchen amLODipine (NORVASC) 5 MG tablet Take 1 tablet (5 mg total) by mouth daily.  Marland Kitchen aspirin EC 81 MG tablet Take 81 mg by mouth daily.  Marland Kitchen atorvastatin (LIPITOR) 80 MG tablet TAKE ONE TABLET (80MG  TOTAL) BY MOUTH AT6PM  . b complex vitamins tablet Take 1 tablet by mouth daily.  . benzonatate (TESSALON) 200 MG capsule TAKE ONE CAPSULE (200MG  TOTAL) BY MOUTH TWO TIMES DAILY AS NEEDED FOR COUGH  . cholecalciferol (VITAMIN D) 1000 units tablet Take 5,000 Units by mouth every morning.   . clopidogrel (PLAVIX) 75 MG tablet TAKE ONE (1) TABLET BY MOUTH EVERY DAY  . isosorbide mononitrate (IMDUR) 30 MG 24 hr tablet Take 1 tablet (30 mg total) by mouth daily.  . metoprolol tartrate (LOPRESSOR) 50 MG tablet Take 1 tablet (50 mg total) by mouth 2 (two) times daily.  . nitroGLYCERIN (NITROSTAT) 0.4 MG SL tablet Place 1 tablet (0.4 mg total) under the tongue every 5 (five) minutes as needed for chest pain.  Marland Kitchen oxyCODONE-acetaminophen (PERCOCET) 10-325 MG per tablet Take 1 tablet by mouth every 8 (eight) hours as needed for pain.   . Pirfenidone (ESBRIET) 267 MG CAPS Take 3 capsules by mouth 3 (three) times daily.  Marland Kitchen tiZANidine (ZANAFLEX) 4 MG tablet Take 4 mg by mouth at bedtime.   . torsemide (DEMADEX) 10 MG tablet Take 1 tablet (10 mg total) by mouth daily. MAY TAKE ADDITIONAL AS NEEDED FOR SWELLING   No facility-administered  encounter medications on file as of 09/14/2019.   Physical Exam: Blood pressure 120/70, pulse 68, temperature 97.9 F (36.6 C), temperature source Temporal, height 6' (1.829 m), weight 248 lb (112.5 kg), SpO2 95 %. Gen:      No acute distress HEENT:  EOMI, sclera anicteric Neck:     No masses; no thyromegaly Lungs:    Clear to auscultation bilaterally; normal respiratory effort CV:         Regular rate and rhythm; no murmurs Abd:      + bowel sounds; soft, non-tender; no palpable masses, no  distension Ext:    No edema; adequate peripheral perfusion Skin:      Warm and dry; no rash Neuro: alert and oriented x 3 Psych: normal mood and affect  Data Reviewed: Imaging: CT high-resolution February 05, 2015 patchy areas of groundglass attenuation, septal thickening with mild basal gradient. CT chest high-resolution 05/09/2016-subpleural reticulation, mild traction bronchiolectasis, mild honeycombing.  Definite UIP pattern high-resolution  CT chest high-resolution 04/24/2019-progression of fibrotic lung lung disease and definite UIP pattern. I have reviewed the images personally.  PFTs: 04/29/2017 FVC 2.66 [55%], FEV1 2.35 [66%], F/F 88, TLC 4.10 [55%], DLCO 15.60 [44%] Moderate restriction with severe diffusion defect.  Worsening lung function compared to 2016.  05/28/2019 FVC 2.34 [49%], FEV1 1.96 [56%], F/F 84, TLC 3.50 [47%], DLCO 12.72 [46%] Severe restriction, diffusion defect.  Worsening lung volumes and diffusion capacity compared to 2008  Labs: CTD serologies 02/23/2017-negative ANA, Jo 1, centromere antibody, rheumatoid factor, Ro, La, SCL 70  Repeat CTD serologies-negative ANA, CCP CK, aldolase, myositis panel, SCL 70, Ro, La Rheumatoid factor 16  Hepatic panel 08/01/2019-within normal limits except for total bilirubin of 1.4  Assessment:  Idiopathic pulmonary fibrosis He has 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  Although there is some occupational exposures he has not had ongoing exposure since 2000.  He does not have significant exposure history or signs and symptoms of connective tissue disease.  CTD serologies significant for borderline rheumatoid factor which is nonsignificant  Discussed at multidisciplinary conference diagnosis of IPF Continue Esbriet.  Unfortunately he is having interruption of delivery from pharmacy Will ask our pharmacist team to help him out Check LFTs today  Having some fatigue with Esbriet.  Continue monitoring.  If symptoms are  persistent then can consider switching to Ofev  Plan/Recommendations: - Continue esbriet. - Check hepatic panel - Pharmacy referral - Follow-up in 1 month - Supplemental oxygen   Marshell Garfinkel MD [facility] 09/14/2019, 11:39 AM  CC: Redmond School, MD

## 2019-09-14 NOTE — Progress Notes (Signed)
Maurice Munoz    PL:[id number]    Aug 25, 1945  Primary Care Physician:Fusco, Purcell Nails, MD  Referring Physician: Redmond School, MD [address] [place],  [place] [address]  Chief complaint: Follow-up for interstitial lung disease, idiopathic pulmonary fibrosis  HPI: 74 year old with history of coronary artery disease, pulmonary fibrosis.  Previously followed by [name].  Prior CT scan shows pulmonary fibrosis which is thought to be secondary to occupational exposure Referred back for worsening dyspnea.  He has had symptoms for the past several years but over few months he has noticed increasing dyspnea on exertion.  Unable to do minimal activities.  He was tested for Covid within the last month and was negative  Patient has established coronary artery disease status post multiple stenting in 2019.  He also has residual circumflex disease that was not intervened upon.  Discussed at multidisciplinary conference on 07/03/19 with diagnosis of IPF and started on Esbriet  Pets: Dog.  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  No ongoing exposures.  No mold, hot tub, Jacuzzi or down pillows or comforters Smoking history: 75-pack-year smoker.  Quit in 1990 Travel history: No significant travel history Relevant family history: Father died of lung cancer.  He was a smoker.  Interim history: Started on Esbriet in early January 2020.  He has tolerated up titration to 3 tablets 3 times daily He has not had any Esbriet this month due to problems with pharmacy and shipment of medication.  He is due to resume it in early March after recent treatment LFTs are normal Continues to have chronic dyspnea on exertion without change.  Outpatient Encounter Medications as of 09/14/2019  Medication Sig  . allopurinol (ZYLOPRIM) 300 MG tablet Take 300 mg by mouth  every morning.   Marland Kitchen amLODipine (NORVASC) 5 MG tablet Take 1 tablet (5 mg total) by mouth daily.  Marland Kitchen aspirin EC 81 MG tablet Take 81 mg by mouth daily.  Marland Kitchen atorvastatin (LIPITOR) 80 MG tablet TAKE ONE TABLET (80MG  TOTAL) BY MOUTH AT6PM  . b complex vitamins tablet Take 1 tablet by mouth daily.  . benzonatate (TESSALON) 200 MG capsule TAKE ONE CAPSULE (200MG  TOTAL) BY MOUTH TWO TIMES DAILY AS NEEDED FOR COUGH  . cholecalciferol (VITAMIN D) 1000 units tablet Take 5,000 Units by mouth every morning.   . clopidogrel (PLAVIX) 75 MG tablet TAKE ONE (1) TABLET BY MOUTH EVERY DAY  . isosorbide mononitrate (IMDUR) 30 MG 24 hr tablet Take 1 tablet (30 mg total) by mouth daily.  . metoprolol tartrate (LOPRESSOR) 50 MG tablet Take 1 tablet (50 mg total) by mouth 2 (two) times daily.  . nitroGLYCERIN (NITROSTAT) 0.4 MG SL tablet Place 1 tablet (0.4 mg total) under the tongue every 5 (five) minutes as needed for chest pain.  Marland Kitchen oxyCODONE-acetaminophen (PERCOCET) 10-325 MG per tablet Take 1 tablet by mouth every 8 (eight) hours as needed for pain.   . Pirfenidone (ESBRIET) 267 MG CAPS Take 3 capsules by mouth 3 (three) times daily.  Marland Kitchen tiZANidine (ZANAFLEX) 4 MG tablet Take 4 mg by mouth at bedtime.   . torsemide (DEMADEX) 10 MG tablet Take 1 tablet (10 mg total) by mouth daily. MAY TAKE ADDITIONAL AS NEEDED FOR SWELLING   No facility-administered encounter medications on file as of 09/14/2019.   Physical Exam: Blood pressure 126/80, pulse 80, temperature 97.9  F (36.6 C), temperature source Temporal, height 6' (1.829 m), weight 254 lb (115.2 kg), SpO2 99 %. Gen:      No acute distress HEENT:  EOMI, sclera anicteric Neck:     No masses; no thyromegaly Lungs:    Bibasal crackles CV:         Regular rate and rhythm; no murmurs Abd:      + bowel sounds; soft, non-tender; no palpable masses, no distension Ext:    No edema; adequate peripheral perfusion Skin:      Warm and dry; no rash Neuro: alert and oriented x  3 Psych: normal mood and affect  Data Reviewed: Imaging: CT high-resolution February 05, 2015 patchy areas of groundglass attenuation, septal thickening with mild basal gradient. CT chest high-resolution 05/09/2016-subpleural reticulation, mild traction bronchiolectasis, mild honeycombing.  Definite UIP pattern high-resolution  CT chest high-resolution 04/24/2019-progression of fibrotic lung lung disease and definite UIP pattern. I have reviewed the images personally.  PFTs: 04/29/2017 FVC 2.66 [55%], FEV1 2.35 [66%], F/F 88, TLC 4.10 [55%], DLCO 15.60 [44%] Moderate restriction with severe diffusion defect.  Worsening lung function compared to 2016.  05/28/2019 FVC 2.34 [49%], FEV1 1.96 [56%], F/F 84, TLC 3.50 [47%], DLCO 12.72 [46%] Severe restriction, diffusion defect.  Worsening lung volumes and diffusion capacity compared to 2008  Labs: CTD serologies 02/23/2017-negative ANA, Jo 1, centromere antibody, rheumatoid factor, Ro, La, SCL 70  Repeat CTD serologies-negative ANA, CCP CK, aldolase, myositis panel, SCL 70, Ro, La Rheumatoid factor 16  Hepatic panel 08/01/2019-within normal limits except for total bilirubin of 1.4  Assessment:  Idiopathic pulmonary fibrosis He has 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  Although there is some occupational exposures he has not had ongoing exposure since 2000.  He does not have significant exposure history or signs and symptoms of connective tissue disease.  CTD serologies significant for borderline rheumatoid factor which is nonsignificant  Discussed at multidisciplinary conference diagnosis of IPF Continue Esbriet.  Unfortunately he had an interruption due to mixup at the pharmacy and is due to resume Esbriet soon Follow-up in 1 month with hepatic panel  Plan/Recommendations: - Continue esbriet. - Follow-up in 1 month - Supplemental oxygen   Marshell Garfinkel MD [facility] 09/14/2019, 11:43 AM  CC: Redmond School, MD

## 2019-09-14 NOTE — Telephone Encounter (Addendum)
Called to discuss Esbriet prescription.  Patient states that he receives his medication from the pharmacy in [place].  He is referring to [facility] which is the pharmacy for [facility] patient assistance.  He states he has had issues refilling his prescription.  He was without medication for a month.  Patient states that the last time he tried to refill his medication he was instructed to contact his providers office as he was out of refills.  Advised that there have been shipping delays the past 2 months due to inclement weather.  Recommend calling the pharmacy for refill when he has 1 weeks worth of medication left.  Patient verbalized understanding.  Patient has been tolerating 3 capsules 3 times daily with food.  Advised that I will send in a prescription for 801 mg tablet take 1 tablet 3 times daily with food.  Patient verbalized understanding.  Stressed the importance of adherence to medication and to please reach out to the office if he is having any difficulty receiving his medication.  Patient verbalized understanding.  All questions encouraged and answered.  Instructed patient to reach out with any further questions or concerns.   Mariella Saa, PharmD, [place], [place] [facility] Pharmacist [phone]  09/14/2019 2:40 PM

## 2019-09-17 ENCOUNTER — Ambulatory Visit (INDEPENDENT_AMBULATORY_CARE_PROVIDER_SITE_OTHER): Payer: Medicare Other | Admitting: Orthopedic Surgery

## 2019-09-17 ENCOUNTER — Other Ambulatory Visit (HOSPITAL_COMMUNITY): Payer: Self-pay | Admitting: Physician Assistant

## 2019-09-17 ENCOUNTER — Other Ambulatory Visit: Payer: Self-pay

## 2019-09-17 VITALS — BP 131/65 | HR 68 | Ht 72.0 in | Wt 248.0 lb

## 2019-09-17 DIAGNOSIS — M7542 Impingement syndrome of left shoulder: Secondary | ICD-10-CM

## 2019-09-17 DIAGNOSIS — M25551 Pain in right hip: Secondary | ICD-10-CM | POA: Diagnosis not present

## 2019-09-17 NOTE — Patient Instructions (Signed)

## 2019-09-17 NOTE — Progress Notes (Signed)
Chief Complaint  Patient presents with  . Shoulder Pain    left/ wants injection   . Hip Problem    right hip feels better, not painful declines xray     Encounter Diagnosis  Name Primary?  . Pain in right hip Yes   Last office visit:  Imaging Pelvis and AP lateral right hip do not show a reason for the complaints noted. Plan:  (Rx., Inj., surg., Frx, MRI/CT, XR:2)   Recommend resume normal activities continue using a cane follow-up with seem to respond to prednisone Chief Complaint  Patient presents with  . Hip Pain      Right hip pain.    HPI The patient presents for evaluation of right hip pain which started over a month ago now has gotten better but it was severe enough to cause him not to walk he took some prednisone seem to get better currently not having pain   Pain was over the right greater trochanter and right groin and he noted pain with range of motion of the hip   Maurice Munoz has chronic rotator cuff disease left shoulder request left shoulder injection patient reports that his breathing started to get worse she is now on oxygen  Procedure note the subacromial injection shoulder left   Verbal consent was obtained to inject the  Left   Shoulder  Timeout was completed to confirm the injection site is a subacromial space of the  left  shoulder  Medication used Depo-Medrol 40 mg and lidocaine 1% 3 cc  Anesthesia was provided by ethyl chloride  The injection was performed in the left  posterior subacromial space. After pinning the skin with alcohol and anesthetized the skin with ethyl chloride the subacromial space was injected using a 20-gauge needle. There were no complications  Sterile dressing was applied.  Encounter Diagnoses  Name Primary?  . Pain in right hip   . Shoulder impingement, left Yes

## 2019-09-19 DIAGNOSIS — E7849 Other hyperlipidemia: Secondary | ICD-10-CM | POA: Diagnosis not present

## 2019-09-19 DIAGNOSIS — I2510 Atherosclerotic heart disease of native coronary artery without angina pectoris: Secondary | ICD-10-CM | POA: Diagnosis not present

## 2019-09-19 DIAGNOSIS — N1831 Chronic kidney disease, stage 3a: Secondary | ICD-10-CM | POA: Diagnosis not present

## 2019-09-19 DIAGNOSIS — I129 Hypertensive chronic kidney disease with stage 1 through stage 4 chronic kidney disease, or unspecified chronic kidney disease: Secondary | ICD-10-CM | POA: Diagnosis not present

## 2019-10-15 DIAGNOSIS — G894 Chronic pain syndrome: Secondary | ICD-10-CM | POA: Diagnosis not present

## 2019-10-15 DIAGNOSIS — M48061 Spinal stenosis, lumbar region without neurogenic claudication: Secondary | ICD-10-CM | POA: Diagnosis not present

## 2019-10-15 DIAGNOSIS — Z6835 Body mass index (BMI) 35.0-35.9, adult: Secondary | ICD-10-CM | POA: Diagnosis not present

## 2019-10-15 DIAGNOSIS — E6609 Other obesity due to excess calories: Secondary | ICD-10-CM | POA: Diagnosis not present

## 2019-10-16 ENCOUNTER — Other Ambulatory Visit: Payer: Self-pay

## 2019-10-16 ENCOUNTER — Other Ambulatory Visit (INDEPENDENT_AMBULATORY_CARE_PROVIDER_SITE_OTHER): Payer: Medicare Other

## 2019-10-16 ENCOUNTER — Ambulatory Visit (INDEPENDENT_AMBULATORY_CARE_PROVIDER_SITE_OTHER): Payer: Medicare Other | Admitting: Pulmonary Disease

## 2019-10-16 ENCOUNTER — Encounter: Payer: Self-pay | Admitting: Pulmonary Disease

## 2019-10-16 VITALS — BP 130/62 | HR 67 | Temp 98.0°F | Ht 72.0 in | Wt 249.0 lb

## 2019-10-16 DIAGNOSIS — J84112 Idiopathic pulmonary fibrosis: Secondary | ICD-10-CM | POA: Diagnosis not present

## 2019-10-16 DIAGNOSIS — Z5181 Encounter for therapeutic drug level monitoring: Secondary | ICD-10-CM

## 2019-10-16 LAB — HEPATIC FUNCTION PANEL
ALT: 18 U/L (ref 0–53)
AST: 26 U/L (ref 0–37)
Albumin: 4.1 g/dL (ref 3.5–5.2)
Alkaline Phosphatase: 109 U/L (ref 39–117)
Bilirubin, Direct: 0.3 mg/dL (ref 0.0–0.3)
Total Bilirubin: 1.1 mg/dL (ref 0.2–1.2)
Total Protein: 7.1 g/dL (ref 6.0–8.3)

## 2019-10-16 MED ORDER — ONDANSETRON HCL 4 MG PO TABS: 4.0000 mg | ORAL_TABLET | Freq: Three times a day (TID) | ORAL | 1 refills | Status: DC | PRN

## 2019-10-16 NOTE — Progress Notes (Signed)
Maurice Munoz    [id number]    07-23-1945  Primary Munoz Physician:Fusco, Purcell Nails, Munoz  Referring Physician: Redmond Munoz, Maurice Munoz,  Maurice Munoz  Chief complaint: Follow-up for Idiopathic pulmonary fibrosis Maurice Munoz January 6153  HPI: 74 year old with history of coronary artery disease, pulmonary fibrosis.  Previously followed by Maurice Munoz.  Prior CT scan shows pulmonary fibrosis which is thought to be secondary to occupational exposure Referred back for worsening dyspnea.  He has had symptoms for the past several years but over few months he has noticed increasing dyspnea on exertion.  Unable to do minimal activities.  He was tested for Covid within the last month and was negative  Patient has established coronary artery disease status post multiple stenting in 2019.  He also has residual circumflex disease that was not intervened upon.  Discussed at multidisciplinary conference on 07/03/19 with diagnosis of IPF and started on Esbriet  Pets: Dog.  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  No ongoing exposures.  No mold, hot tub, Jacuzzi or down pillows or comforters Smoking history: 75-pack-year smoker.  Quit in 1990 Travel history: No significant travel history Relevant family history: Father died of lung cancer.  He was a smoker.  Interim history: Started on Esbriet in early January 2020.  He has tolerated up titration to 3 tablets 3 times daily Has occasional nausea symptoms but not every day. Continues to have chronic dyspnea on exertion without change.  Outpatient Encounter Medications as of 10/16/2019  Medication Sig  . allopurinol (ZYLOPRIM) 300 MG tablet Take 300 mg by mouth every morning.   Marland Kitchen amLODipine (NORVASC) 5 MG tablet Take 1 tablet (5 mg total) by mouth daily.  Marland Kitchen aspirin EC 81 MG  tablet Take 81 mg by mouth daily.  Marland Kitchen atorvastatin (LIPITOR) 80 MG tablet TAKE ONE TABLET (80MG  TOTAL) BY MOUTH AT6PM  . b complex vitamins tablet Take 1 tablet by mouth daily.  . benzonatate (TESSALON) 200 MG capsule TAKE ONE CAPSULE (200MG  TOTAL) BY MOUTH TWO TIMES DAILY AS NEEDED FOR COUGH  . cholecalciferol (VITAMIN D) 1000 units tablet Take 5,000 Units by mouth every morning.   . clopidogrel (PLAVIX) 75 MG tablet TAKE ONE (1) TABLET BY MOUTH EVERY DAY  . metoprolol tartrate (LOPRESSOR) 50 MG tablet Take 1 tablet (50 mg total) by mouth 2 (two) times daily.  Marland Kitchen oxyCODONE-acetaminophen (PERCOCET) 10-325 MG per tablet Take 1 tablet by mouth every 8 (eight) hours as needed for pain.   . Pirfenidone (ESBRIET) 801 MG TABS Take 1 tablet by mouth 3 (three) times daily with meals.  Marland Kitchen tiZANidine (ZANAFLEX) 4 MG tablet Take 4 mg by mouth at bedtime.   . torsemide (DEMADEX) 10 MG tablet Take 1 tablet (10 mg total) by mouth daily. MAY TAKE ADDITIONAL AS NEEDED FOR SWELLING  . isosorbide mononitrate (IMDUR) 30 MG 24 hr tablet Take 1 tablet (30 mg total) by mouth daily.  . nitroGLYCERIN (NITROSTAT) 0.4 MG SL tablet Place 1 tablet (0.4 mg total) under the tongue every 5 (five) minutes as needed for chest pain.   No facility-administered encounter medications on file as of 10/16/2019.   Physical Exam: Blood pressure 130/62, pulse 67, temperature 98 F (36.7 C), temperature source Temporal, height 6' (1.829 m), weight 249 lb (112.9 kg), SpO2 93 %. Gen:  No acute distress HEENT:  EOMI, sclera anicteric Neck:     No masses; no thyromegaly Lungs:    Clear to auscultation bilaterally; normal respiratory effort CV:         Regular rate and rhythm; no murmurs Abd:      + bowel sounds; soft, non-tender; no palpable masses, no distension Ext:    No edema; adequate peripheral perfusion Skin:      Warm and dry; no rash Neuro: alert and oriented x 3 Psych: normal mood and affect  Data Reviewed: Imaging: CT  high-resolution February 05, 2015 patchy areas of groundglass attenuation, septal thickening with mild basal gradient. CT chest high-resolution 05/09/2016-subpleural reticulation, mild traction bronchiolectasis, mild honeycombing.  Definite UIP pattern high-resolution  CT chest high-resolution 04/24/2019-progression of fibrotic lung lung disease and definite UIP pattern. I have reviewed the images personally.  PFTs: 04/29/2017 FVC 2.66 [55%], FEV1 2.35 [66%], F/F 88, TLC 4.10 [55%], DLCO 15.60 [44%] Moderate restriction with severe diffusion defect.  Worsening lung function compared to 2016.  05/28/2019 FVC 2.34 [49%], FEV1 1.96 [56%], F/F 84, TLC 3.50 [47%], DLCO 12.72 [46%] Severe restriction, diffusion defect.  Worsening lung volumes and diffusion capacity compared to 2008  Labs: CTD serologies 02/23/2017-negative ANA, Jo 1, centromere antibody, rheumatoid factor, Ro, La, SCL 70  Repeat CTD serologies-negative ANA, CCP CK, aldolase, myositis panel, SCL 70, Ro, La Rheumatoid factor 16  Hepatic panel 08/01/2019-within normal limits except for total bilirubin of 1.4  Assessment:  Idiopathic pulmonary fibrosis He has 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  Although there is some occupational exposures he has not had ongoing exposure since 2000.  He does not have significant exposure history or signs and symptoms of connective tissue disease.  CTD serologies significant for borderline rheumatoid factor which is nonsignificant  Discussed at multidisciplinary conference diagnosis of IPF Continue Esbriet.  Has occasional nausea and fatigue with Esbriet. Continue monitoring.  If symptoms are persistent then can consider switching to Ofev Starting Zofran as needed Check LFTs today  Plan/Recommendations: - Continue esbriet. - Check hepatic panel - Follow-up in 1 month   Maurice Garfinkel Munoz Maurice Munoz 10/16/2019, 8:28 AM  CC: Maurice Munoz

## 2019-10-16 NOTE — Patient Instructions (Addendum)
Continue the Esbriet We will call in Zofran 4 mg as needed for nausea We will check hepatic panel today  Follow-up in 1 month.

## 2019-10-16 NOTE — Addendum Note (Signed)
Addended by: Elton Sin on: 10/16/2019 08:40 AM   Modules accepted: Orders

## 2019-10-19 DIAGNOSIS — I129 Hypertensive chronic kidney disease with stage 1 through stage 4 chronic kidney disease, or unspecified chronic kidney disease: Secondary | ICD-10-CM | POA: Diagnosis not present

## 2019-10-19 DIAGNOSIS — N1831 Chronic kidney disease, stage 3a: Secondary | ICD-10-CM | POA: Diagnosis not present

## 2019-10-19 DIAGNOSIS — E7849 Other hyperlipidemia: Secondary | ICD-10-CM | POA: Diagnosis not present

## 2019-10-19 DIAGNOSIS — I2510 Atherosclerotic heart disease of native coronary artery without angina pectoris: Secondary | ICD-10-CM | POA: Diagnosis not present

## 2019-11-05 ENCOUNTER — Other Ambulatory Visit: Payer: Self-pay | Admitting: Cardiovascular Disease

## 2019-11-13 DIAGNOSIS — G894 Chronic pain syndrome: Secondary | ICD-10-CM | POA: Diagnosis not present

## 2019-11-13 DIAGNOSIS — E6609 Other obesity due to excess calories: Secondary | ICD-10-CM | POA: Diagnosis not present

## 2019-11-13 DIAGNOSIS — M48061 Spinal stenosis, lumbar region without neurogenic claudication: Secondary | ICD-10-CM | POA: Diagnosis not present

## 2019-11-15 ENCOUNTER — Other Ambulatory Visit: Payer: Self-pay | Admitting: Pulmonary Disease

## 2019-11-15 ENCOUNTER — Other Ambulatory Visit: Payer: Self-pay

## 2019-11-15 ENCOUNTER — Encounter: Payer: Self-pay | Admitting: Pulmonary Disease

## 2019-11-15 ENCOUNTER — Ambulatory Visit (INDEPENDENT_AMBULATORY_CARE_PROVIDER_SITE_OTHER): Payer: Medicare Other | Admitting: Pulmonary Disease

## 2019-11-15 ENCOUNTER — Other Ambulatory Visit: Payer: Medicare Other

## 2019-11-15 VITALS — BP 124/62 | HR 64 | Temp 97.5°F | Ht 72.0 in | Wt 249.8 lb

## 2019-11-15 DIAGNOSIS — Z79899 Other long term (current) drug therapy: Secondary | ICD-10-CM

## 2019-11-15 DIAGNOSIS — J84112 Idiopathic pulmonary fibrosis: Secondary | ICD-10-CM | POA: Diagnosis not present

## 2019-11-15 DIAGNOSIS — Z5181 Encounter for therapeutic drug level monitoring: Secondary | ICD-10-CM

## 2019-11-15 LAB — COMPLETE METABOLIC PANEL WITH GFR
AG Ratio: 1.7 (calc) (ref 1.0–2.5)
ALT: 17 U/L (ref 9–46)
AST: 24 U/L (ref 10–35)
Albumin: 4.4 g/dL (ref 3.6–5.1)
Alkaline phosphatase (APISO): 117 U/L (ref 35–144)
BUN/Creatinine Ratio: 14 (calc) (ref 6–22)
BUN: 18 mg/dL (ref 7–25)
CO2: 31 mmol/L (ref 20–32)
Calcium: 9.8 mg/dL (ref 8.6–10.3)
Chloride: 102 mmol/L (ref 98–110)
Creat: 1.27 mg/dL — ABNORMAL HIGH (ref 0.70–1.18)
GFR, Est African American: 65 mL/min/{1.73_m2} (ref >=60)
GFR, Est Non African American: 56 mL/min/{1.73_m2} — ABNORMAL LOW (ref >=60)
Globulin: 2.6 g/dL (calc) (ref 1.9–3.7)
Glucose, Bld: 91 mg/dL (ref 65–99)
Potassium: 4.3 mmol/L (ref 3.5–5.3)
Sodium: 138 mmol/L (ref 135–146)
Total Bilirubin: 1.1 mg/dL (ref 0.2–1.2)
Total Protein: 7 g/dL (ref 6.1–8.1)

## 2019-11-15 MED ORDER — ONDANSETRON HCL 4 MG PO TABS: 4.0000 mg | ORAL_TABLET | Freq: Three times a day (TID) | ORAL | 5 refills | Status: DC | PRN

## 2019-11-15 MED ORDER — BENZONATATE 200 MG PO CAPS: ORAL_CAPSULE | ORAL | 2 refills | Status: DC

## 2019-11-15 NOTE — Addendum Note (Signed)
Addended by: Coralie Keens on: 11/15/2019 08:56 AM   Modules accepted: Orders

## 2019-11-15 NOTE — Progress Notes (Signed)
ESTEFANO CROMBIE    QN:[id number]    [birth year]  Primary Care Physician:Fusco, Purcell Nails, MD  Referring Physician: Redmond School, [place] [address],  [place] [address]  Chief complaint: Follow-up for Idiopathic pulmonary fibrosis [name] January 5339  HPI: 74 year old with history of coronary artery disease, pulmonary fibrosis.  Previously followed by [name].  Prior CT scan shows pulmonary fibrosis which is thought to be secondary to occupational exposure Referred back for worsening dyspnea.  He has had symptoms for the past several years but over few months he has noticed increasing dyspnea on exertion.  Unable to do minimal activities.  He was tested for Covid within the last month and was negative  Patient has established coronary artery disease status post multiple stenting in 2019.  He also has residual circumflex disease that was not intervened upon.  Discussed at multidisciplinary conference on 07/03/19 with diagnosis of IPF and started on Esbriet  Pets: Dog.  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  No ongoing exposures.  No mold, hot tub, Jacuzzi or down pillows or comforters Smoking history: 75-pack-year smoker.  Quit in 1990 Travel history: No significant travel history Relevant family history: Father died of lung cancer.  He was a smoker.  Interim history: Tolerating Esbriet well. He has nausea symptoms mostly with the first dose with rest.  He is taking Zofran with some relief No change in chronic dyspnea on exertion.  Outpatient Encounter Medications as of 11/15/2019  Medication Sig  . allopurinol (ZYLOPRIM) 300 MG tablet Take 300 mg by mouth every morning.   Marland Kitchen amLODipine (NORVASC) 5 MG tablet TAKE ONE TABLET (5MG  TOTAL) BY MOUTH DAILY  . aspirin EC 81 MG tablet Take 81 mg by mouth daily.  Marland Kitchen  atorvastatin (LIPITOR) 80 MG tablet TAKE ONE TABLET (80MG  TOTAL) BY MOUTH AT6PM  . b complex vitamins tablet Take 1 tablet by mouth daily.  . cholecalciferol (VITAMIN D) 1000 units tablet Take 5,000 Units by mouth every morning.   . clopidogrel (PLAVIX) 75 MG tablet TAKE ONE (1) TABLET BY MOUTH EVERY DAY  . isosorbide mononitrate (IMDUR) 30 MG 24 hr tablet TAKE ONE TABLET (30MG  TOTAL) BY MOUTH DAILY  . metoprolol tartrate (LOPRESSOR) 50 MG tablet Take 1 tablet (50 mg total) by mouth 2 (two) times daily.  . ondansetron (ZOFRAN) 4 MG tablet Take 1 tablet (4 mg total) by mouth every 8 (eight) hours as needed for nausea or vomiting.  Marland Kitchen oxyCODONE-acetaminophen (PERCOCET) 10-325 MG per tablet Take 1 tablet by mouth every 8 (eight) hours as needed for pain.   . Pirfenidone (ESBRIET) 801 MG TABS Take 1 tablet by mouth 3 (three) times daily with meals.  Marland Kitchen tiZANidine (ZANAFLEX) 4 MG tablet Take 4 mg by mouth at bedtime.   . torsemide (DEMADEX) 10 MG tablet Take 1 tablet (10 mg total) by mouth daily. MAY TAKE ADDITIONAL AS NEEDED FOR SWELLING  . benzonatate (TESSALON) 200 MG capsule TAKE ONE CAPSULE (200MG  TOTAL) BY MOUTH TWO TIMES DAILY AS NEEDED FOR COUGH (Patient not taking: Reported on 11/15/2019)  . nitroGLYCERIN (NITROSTAT) 0.4 MG SL tablet Place 1 tablet (0.4 mg total) under the tongue every 5 (five) minutes as needed for chest pain.   No facility-administered encounter medications on file as of 11/15/2019.   Physical Exam: Blood pressure 124/62, pulse 64, temperature (!)  97.5 F (36.4 C), temperature source Oral, height 6' (1.829 m), weight 249 lb 12.8 oz (113.3 kg), SpO2 97 %. Gen:      No acute distress HEENT:  EOMI, sclera anicteric Neck:     No masses; no thyromegaly Lungs:    Bibasal crackles CV:         Regular rate and rhythm; no murmurs Abd:      + bowel sounds; soft, non-tender; no palpable masses, no distension Ext:    No edema; adequate peripheral perfusion Skin:      Warm and dry; no  rash Neuro: alert and oriented x 3 Psych: normal mood and affect  Data Reviewed: Imaging: CT high-resolution February 05, 2015 patchy areas of groundglass attenuation, septal thickening with mild basal gradient. CT chest high-resolution 05/09/2016-subpleural reticulation, mild traction bronchiolectasis, mild honeycombing.  Definite UIP pattern high-resolution  CT chest high-resolution 04/24/2019-progression of fibrotic lung lung disease and definite UIP pattern. I have reviewed the images personally.  PFTs: 04/29/2017 FVC 2.66 [55%], FEV1 2.35 [66%], F/F 88, TLC 4.10 [55%], DLCO 15.60 [44%] Moderate restriction with severe diffusion defect.  Worsening lung function compared to 2016.  05/28/2019 FVC 2.34 [49%], FEV1 1.96 [56%], F/F 84, TLC 3.50 [47%], DLCO 12.72 [46%] Severe restriction, diffusion defect.  Worsening lung volumes and diffusion capacity compared to 2008  Labs: CTD serologies 02/23/2017-negative ANA, Jo 1, centromere antibody, rheumatoid factor, Ro, La, SCL 70  Repeat CTD serologies-negative ANA, CCP CK, aldolase, myositis panel, SCL 70, Ro, La Rheumatoid factor 16  Hepatic panel 08/01/2019-within normal limits except for total bilirubin of 1.4  Assessment:  Idiopathic pulmonary fibrosis He has 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  Although there is some occupational exposures he has not had ongoing exposure since 2000.  He does not have significant exposure history or signs and symptoms of connective tissue disease.  CTD serologies significant for borderline rheumatoid factor which is nonsignificant  Discussed at multidisciplinary conference diagnosis of IPF Continue Esbriet.  Has occasional nausea and fatigue with Esbriet. Continue monitoring.  If symptoms are persistent then can consider switching to Ofev Zofran as needed Check LFTs today and in June.  If stable then we can transition to every 20-month follow-up.  Plan/Recommendations: - Continue esbriet. - Check  hepatic panel - Follow-up in 1 month   Marshell Garfinkel MD [facility] 11/15/2019, 8:32 AM  CC: Redmond School, MD

## 2019-11-15 NOTE — Patient Instructions (Signed)
We will check a comprehensive metabolic panel today We will renew your Zofran with 90-day supply and refills Follow-up in 1 month with me or APP for LFT check.

## 2019-11-16 ENCOUNTER — Encounter: Payer: Self-pay | Admitting: Cardiovascular Disease

## 2019-11-16 ENCOUNTER — Ambulatory Visit (INDEPENDENT_AMBULATORY_CARE_PROVIDER_SITE_OTHER): Payer: Medicare Other | Admitting: Cardiovascular Disease

## 2019-11-16 VITALS — BP 113/71 | HR 75 | Ht 73.0 in | Wt 248.2 lb

## 2019-11-16 DIAGNOSIS — I10 Essential (primary) hypertension: Secondary | ICD-10-CM

## 2019-11-16 DIAGNOSIS — I25118 Atherosclerotic heart disease of native coronary artery with other forms of angina pectoris: Secondary | ICD-10-CM

## 2019-11-16 DIAGNOSIS — R600 Localized edema: Secondary | ICD-10-CM

## 2019-11-16 DIAGNOSIS — J8410 Pulmonary fibrosis, unspecified: Secondary | ICD-10-CM | POA: Diagnosis not present

## 2019-11-16 DIAGNOSIS — E785 Hyperlipidemia, unspecified: Secondary | ICD-10-CM | POA: Diagnosis not present

## 2019-11-16 MED ORDER — TORSEMIDE 20 MG PO TABS: 20.0000 mg | ORAL_TABLET | Freq: Every day | ORAL | 3 refills | Status: DC

## 2019-11-16 MED ORDER — ISOSORBIDE MONONITRATE ER 60 MG PO TB24: 60.0000 mg | ORAL_TABLET | Freq: Every day | ORAL | 3 refills | Status: DC

## 2019-11-16 NOTE — Progress Notes (Signed)
Patient ID: Maurice Munoz, male   DOB: April 18, 1946, 74 y.o.   MRN: [id number]      HPI: Maurice Munoz, is a 74 y.o. male who presents to the office today for a 6 month cardiology followup evaluation.     [name] has established CAD dating back to 1999 when he underwent intervention to his RCA and PDA. In April 2007 he underwent stenting to his LAD and distal RCA at which time Cypher DES stents were inserted. He has a history of hypertension, obesity, hyperlipidemia, gout, and  lower extremity edema.   A two-year followup nuclear perfusion study in March 2014 was essentially unchanged from his prior study and continued to show fairly normal perfusion and was low risk, without evidence for scar. There was no significant ischemia.  In 2015 he had of the Plavix for 3 months due to injury to his right leg.  He has noted some intermittent leg swelling.  He had carotids Doppler study in January 2016 which showed 50-60% left proximal ICA diameter reduction, which had slightly increased from his prior study one year previously.  Because of a history of coughing up blood with pleuritic chest pain he underwent a CT Ango of his chest on March 21 which was negative for PE but showed diffuse peripheral lung densities throughout both upper and lower zones without honeycombing raising the [name] concern for mild fibrotic changes but the pattern was nonspecific.    Over the last several years he has lost over 40 pounds from his peak weight.  He  was seen in the emergency room with lightheadedness and was hypotensive.  He was told to discontinue his isosorbide as well as lisinopril.   In the setting of his hypotension he was found to have an increased creatinine at 1.4 recently had been 1.1. He has been followed by Dr.Befakadu in [place] for his renal issues.  Over the past year, he has experienced occasional episodes of chest pain which he describes as tightness.  Typically, this can occur with  exertion.  At times he also notices a chest burning.  He admits to shortness of breath.  He has been evaluated by [name] for pulmonary fibrosis.  He has an extensive occupational history infrequently had worked in Lobbyist, Engineer, materials, and chrome plating for many years and was often exposed to significant dust inhalation.  He also had worked in Industrial/product designer.  There is remote tobacco history of 2-3 packs per day for 20-25 years, but unfortunately he quit in 1980s.  He is felt most likely to have interstitial lung disease and continues further evaluation.  He has had difficulty with his low back.  He has been on lisinopril 5 mg, metoprolol 50 mg twice a day.  He continues to take simvastatin 20 mg for hyperlipidemia.  He is been maintained on aspirin and Plavix.  There is remote history of gout, which is controlled with allopurinol.  When I saw him on February 2019, he had noticed episodes of chest burning as well as chest tightness and continues to experience exertional dyspnea.  I initiated amlodipine at 5 mg.  I strongly recommended support stockings for his potential edema.  He was given a prescription for sublabel nitroglycerin.  He underwent a follow-up [place] study of 08/12/2017 which was low risk and without ischemia.  EF was 57%.  There was minimal apical thinning most likely contributed by his body habitus.  He underwent an echo Doppler study on 10/14/2017 which showed  an EF of 60-65% and grade 2 diastolic dysfunction.  There was moderate pulmonary hypertension with PA pressure at 46 mm.    At evaluation in March 2019 he was feeling better.  He denied recurrent chest pain or shortness of breath.  He is lost 21 pounds in 3 weeks purposefully.   Since I last saw him in the office, he developed recurrent chest pain symptomatology and developed unstable angina symptoms leading to hospitalization with a non-ST segment elevation MI.  On April 12, 2018 he underwent diagnostic catheterization by Dr.  Illene Bolus due to multivessel CAD he underwent surgical consultation for consideration of CABG surgery but was turned down.  As result, on April 14, 2018, I performed successful multi lesion/multivessel PCI to the RCA and LAD vessels.  He underwent Cutting Balloon PTCA to the mid PDA vessel of the RCA with a 95% stenosis being reduced to 15% and Cutting Balloon PTCA DES stenting with a 2.5 x12 mm Resolute stent inserted into the distal RCA immediately proximal to the takeoff of the PDA vessel with stent overlap to the previously placed stent with a 99% stenosis reduced to 0%.  He also underwent 3 lesion intervention to the LAD with PTCA of the 90% apical LAD stenosis, PTCA DES stenting at 2 additional study sites in the LAD successfully.  He still had residual concomitant 80% AV groove circumflex and circumflex marginal disease which was treated medically.  Maurice Munoz has noted significant improvement following his multilesion multivessel intervention to his RCA and LAD.  He has been seen in the office since by [name], NP as well as [name], PA.  When last seen by [name], he reduced his amlodipine since his blood pressure was low and he also had 2+ pitting edema for which he recommended restarting his previous torsemide daily.  I last saw him in November 2020.  He underwent an echo Doppler study 24 2020 which showed normal systolic with EF at 60 to 65%, mild LVH diastolic parameters were indeterminate.  He had moderately elevated pulmonary artery systolic pressure at 42 mm.    He had done well since his last evaluation but over the past 2 weeks has again begun to notice some recurrent episodes of chest pain and shortness of breath.  He also has been diagnosed with pulmonary fibrosis.  He continues to experience mild leg swelling.  During that evaluation I recommended initiating isosorbide mononitrate 30 mg.  He had been on low-dose amlodipine at 2.5 mg and I recommended further titration of 5 mg.  He  continues to be on metoprolol tartrate 25 mg twice a day and remained on DAPT with aspirin/Plavix.   He was to follow-up with Dr. Marshell Garfinkel for his pulmonary fibrosis.  He has been started on Esbriet for his pulmonary fibrosis.  He has noticed leg swelling bilaterally left greater than right.  Approximately 2 weeks ago he took sublingual nitroglycerin with improvement of his mild chest pain.  He presents for reevaluation.   Past Medical History:  Diagnosis Date  . Adrenal adenoma, left    small  . Aortic atherosclerosis ([place])   . Back pain   . Bilateral carotid artery stenosis followed by dr Claiborne Billings   per last carotid duplex [id number]  -- proximal LICA 37-09% ,  RICA 6-43%  . CAD (coronary artery disease)    a. s/p prior intervention to RCA and PDA in 1999 b. DES to LAD and distal RCA in 2007  . Cardiomegaly  Stable  . Chronic constipation   . Chronic pain syndrome   . CKD (chronic kidney disease), stage III   . DDD (degenerative disc disease), cervical   . DDD (degenerative disc disease), lumbosacral   . Dyspnea   . Dyspnea on exertion   . Gout    per pt last inflared episode 2015 approx.  . Grade II diastolic dysfunction   . H/O epistaxis    per pt sees ENT dr Benjamine Mola for cauterization (pt takes plavix)  . History of acute pancreatitis 08/08/2014  . History of kidney stones   . HTN (hypertension)   . Hyperlipidemia   . Interstitial lung disease ([place])   . Left arm pain    s/p fall  . Nocturia   . Numbness of right foot    due to DDD lumbar  . Peripheral edema   . Pilonidal cyst   . Pleural effusion on right   . Pulmonary fibrosis ([place])    followed by pcp-- last chest CT 04-08-2016  . S/P drug eluting coronary stent placement 10/11/2005   mLAD and dRCA  . Wears dentures    upper  . Wears glasses     Past Surgical History:  Procedure Laterality Date  . ANTERIOR CERVICAL DECOMP/DISCECTOMY FUSION  12-25-2001     dr Orinda Kenner [facility]   C3 -- C7  . ANTERIOR CERVICAL  DECOMP/DISCECTOMY FUSION  12-11-2012   dr Carloyn Manner  [facility])  . CARDIOVASCULAR STRESS TEST  08-23-2012    dr Claiborne Billings   normal lexiscan nuclear study w/ no ischemia/  normal LV function and wall motion, ef 65%  . CARDIOVASCULAR STRESS TEST    . CORONARY ANGIOPLASTY  1999   PTCA to RCA & PDA  . CORONARY ANGIOPLASTY WITH STENT PLACEMENT  10-11-2005  dr Shelva Majestic   PCI and stenting to [place] (Cypher DES x2)  . CORONARY BALLOON ANGIOPLASTY N/A 04/14/2018   Procedure: CORONARY BALLOON ANGIOPLASTY;  Surgeon: Troy Sine, MD;  Location: [place] [facility];  Service: Cardiovascular;  Laterality: N/A;  . CORONARY STENT INTERVENTION N/A 04/14/2018   Procedure: CORONARY STENT INTERVENTION;  Surgeon: Troy Sine, MD;  Location: [place] [facility];  Service: Cardiovascular;  Laterality: N/A;  . CYSTOSCOPY W/ URETERAL STENT PLACEMENT Left 02-11-2010     [facility]  . LEFT HEART CATH N/A 04/14/2018   Procedure: Left Heart Cath;  Surgeon: Troy Sine, MD;  Location: [place] [facility];  Service: Cardiovascular;  Laterality: N/A;  . LEFT HEART CATH AND CORONARY ANGIOGRAPHY N/A 04/12/2018   Procedure: LEFT HEART CATH AND CORONARY ANGIOGRAPHY;  Surgeon: Jettie Booze, MD;  Location: [place] [facility];  Service: Cardiovascular;  Laterality: N/A;  . LUMBAR LAMINECTOMY  2015   L5 -- S1  . PILONIDAL CYST EXCISION  1980s  . PILONIDAL CYST EXCISION N/A 03/31/2017   Procedure: EXCISION OF PILONIDAL DISEASE with Flap Rotation;  Surgeon: Leighton Ruff, MD;  Location: [place];  Service: General;  Laterality: N/A;  . THROAT SURGERY  1996   per pt "removal piece of cryloid(?) that was pressing against throat"  states no issues since  . TRANSTHORACIC ECHOCARDIOGRAM  08-24-2010  dr Claiborne Billings   ef 50-55%/  mild MR/  trivial TR  . WOUND DEBRIDEMENT N/A 10/06/2017   Procedure: DEBRIDEMENT WOUND PLACEMENT OF WOUND HEALING MATRIX;  Surgeon: Leighton Ruff, MD;  Location: [place];  Service: General;  Laterality: N/A;    Allergies  Allergen Reactions  . Ibuprofen Itching    Motrin brand  . Neosporin [Neomycin-Bacitracin Zn-Polymyx] Swelling  . Penicillins Rash     Has patient had a PCN reaction causing immediate rash, facial/tongue/throat swelling, SOB or lightheadedness with hypotension: No Has patient had a PCN reaction causing severe rash involving mucus membranes or skin necrosis: No Has patient had a PCN reaction that required hospitalization: No Has patient had a PCN reaction occurring within the last 10 years: No If all of the above answers are "NO", then may proceed with Cephalosporin use.     Current Outpatient Medications  Medication Sig Dispense Refill  . allopurinol (ZYLOPRIM) 300 MG tablet Take 300 mg by mouth every morning.     Marland Kitchen amLODipine (NORVASC) 5 MG tablet TAKE ONE TABLET (5MG TOTAL) BY MOUTH DAILY 90 tablet 3  . aspirin EC 81 MG tablet Take 81 mg by mouth daily.    Marland Kitchen atorvastatin (LIPITOR) 80 MG tablet TAKE ONE TABLET (80MG TOTAL) BY MOUTH AT6PM 90 tablet 1  . b complex vitamins tablet Take 1 tablet by mouth daily.    . benzonatate (TESSALON) 200 MG capsule TAKE ONE CAPSULE (200MG TOTAL) BY MOUTH TWO TIMES DAILY AS NEEDED FOR COUGH 60 capsule 2  . cholecalciferol (VITAMIN D) 1000 units tablet Take 5,000 Units by mouth every morning.     . clopidogrel (PLAVIX) 75 MG tablet TAKE ONE (1) TABLET BY MOUTH EVERY DAY 90 tablet 0  . isosorbide mononitrate (IMDUR) 60 MG 24 hr tablet Take 1 tablet (60 mg total) by mouth daily. 90 tablet 3  . metoprolol tartrate (LOPRESSOR) 50 MG tablet Take 1 tablet (50 mg total) by mouth 2 (two) times daily. 180 tablet 2  . ondansetron (ZOFRAN) 4 MG tablet Take 1 tablet (4 mg total) by mouth every 8 (eight) hours as needed for nausea or vomiting. 90 tablet 5  . oxyCODONE-acetaminophen (PERCOCET) 10-325 MG per tablet Take 1 tablet by mouth every 8 (eight) hours as needed for pain.     . Pirfenidone  (ESBRIET) 801 MG TABS Take 1 tablet by mouth 3 (three) times daily with meals. 90 tablet 2  . tiZANidine (ZANAFLEX) 4 MG tablet Take 4 mg by mouth at bedtime.     . torsemide (DEMADEX) 20 MG tablet Take 1 tablet (20 mg total) by mouth daily. IF ADDITIONAL SWELLING, MAY TAKE AN ADDITIONAL DOSE IN THE AFTERNOON 90 tablet 3  . nitroGLYCERIN (NITROSTAT) 0.4 MG SL tablet Place 1 tablet (0.4 mg total) under the tongue every 5 (five) minutes as needed for chest pain. 25 tablet 3   No current facility-administered medications for this visit.    Socially he is married. There are no children. Has a remote tobacco history having quit approximately 16 years ago. He's not very active. He does not routinely exercise.  ROS General: Negative; No fevers, chills, or night sweats;  HEENT: Negative; No changes in vision or hearing, sinus congestion, difficulty swallowing Pulmonary: Positive for pulmonary fibrosis Cardiovascular: See history of present illness GI: Negative; No nausea, vomiting, diarrhea, or abdominal pain GU: Negative; No dysuria, hematuria, or difficulty voiding Musculoskeletal: Negative; no myalgias, joint pain, or weakness Hematologic/Oncology: Negative; no easy bruising, bleeding Endocrine: Negative; no heat/cold intolerance; no diabetes Neuro: Negative; no changes in balance, headaches Skin: Negative; No rashes or skin lesions Psychiatric: Negative; No behavioral problems, depression Sleep: Negative; No snoring, daytime sleepiness, hypersomnolence, bruxism, restless legs, hypnogognic hallucinations, no cataplexy Other comprehensive 14 point system review is negative.  PE BP 113/71   Pulse 75   Ht 6' 1"  (1.854 m)   Wt 248 lb 3.2 oz (112.6 kg)   SpO2 96%   BMI 32.75 kg/m    Repeat blood pressure by me was 140/70  Wt Readings from Last 3 Encounters:  11/16/19 248 lb 3.2 oz (112.6 kg)  11/15/19 249 lb 12.8 oz (113.3 kg)  10/16/19 249 lb (112.9 kg)   General: Alert, oriented,  no distress.  Skin: normal turgor, no rashes, warm and dry HEENT: Normocephalic, atraumatic. Pupils equal round and reactive to light; sclera anicteric; extraocular muscles intact; Nose without nasal septal hypertrophy Mouth/Parynx benign; Mallinpatti scale Neck: No JVD, no carotid bruits; normal carotid upstroke Lungs: Slightly decreased breath sounds, no wheezing Chest wall: without tenderness to palpitation Heart: PMI not displaced, RRR, s1 s2 normal, 1/6 systolic murmur, no diastolic murmur, no rubs, gallops, thrills, or heaves Abdomen: soft, nontender; no hepatosplenomehaly, BS+; abdominal aorta nontender and not dilated by palpation. Back: no CVA tenderness Pulses 2+ Musculoskeletal: full range of motion, normal strength, no joint deformities Extremities: 2+ leg edema left greater than right no clubbing cyanosis, Homan's sign negative  Neurologic: grossly nonfocal; Cranial nerves grossly wnl Psychologic: Normal mood and affect   ECG (independently read by me): NSR at 66; no ST changes; normal intervals  November 2020 ECG (independently read by me): Normal sinus rhythm at 75 bpm.  No ectopy.  Normal intervals  March 2019 ECG (independently read by me): Bradycardia 57 bpm.  No ectopy.  Normal intervals.  February 2018 ECG (independently read by me): Sinus bradycardia 57 bpm.  No ectopy.  Normal intervals.  January 2018 ECG (independently read by me): Sinus rhythm at 60 bpm.  No ectopy.  Normal intervals.  June 2017 ECG (independently read by me): Normal sinus rhythm at 61 bpm.  No ectopy.  Normal intervals.  April 2016 ECG (independently read by me): Normal sinus rhythm at 67 bpm.  No ectopy.  Normal intervals.  October 2014 ECG: Normal sinus rhythm at 63 beats per minute. No significant ST abnormalities. Normal intervals.  LABS:  BMP Latest Ref Rng & Units 11/15/2019 05/01/2019 05/16/2018  Glucose 65 - 99 mg/dL 91 112(H) 105(H)  BUN 7 - 25 mg/dL 18 19 17   Creatinine 0.70 -  1.18 mg/dL 1.27(H) 1.18 1.16  BUN/Creat Ratio 6 - 22 (calc) 14 - -  Sodium 135 - 146 mmol/L 138 139 139  Potassium 3.5 - 5.3 mmol/L 4.3 4.0 4.0  Chloride 98 - 110 mmol/L 102 102 107  CO2 20 - 32 mmol/L 31 29 26   Calcium 8.6 - 10.3 mg/dL 9.8 9.6 9.2    Hepatic Function Latest Ref Rng & Units 11/15/2019 10/16/2019 09/14/2019  Total Protein 6.1 - 8.1 g/dL 7.0 7.1 7.2  Albumin 3.5 - 5.2 g/dL - 4.1 4.2  AST 10 - 35 U/L 24 26 24   ALT 9 - 46 U/L 17 18 20   Alk Phosphatase 39 - 117 U/L - 109 103  Total Bilirubin 0.2 - 1.2 mg/dL 1.1 1.1 1.1  Bilirubin, Direct 0.0 - 0.3 mg/dL - 0.3 0.3   CBC Latest Ref Rng & Units 05/01/2019 05/16/2018 04/15/2018  WBC 4.0 - 10.5 K/uL 11.3(H) 8.1 10.5  Hemoglobin 13.0 - 17.0 g/dL 14.8 13.5 12.7(L)  Hematocrit 39.0 - 52.0 % 44.4 42.4 39.3  Platelets 150.0 - 400.0 K/uL 221.0 215 198   Lab Results  Component Value Date   MCV 88.4 05/01/2019   MCV 91.0 05/16/2018  MCV 90.8 04/15/2018    Lab Results  Component Value Date   TSH 1.150 11/24/2015   Lipid Panel     Component Value Date/Time   CHOL 146 04/13/2018 0513   CHOL 144 11/24/2015 1127   TRIG 133 04/13/2018 0513   HDL 33 (L) 04/13/2018 0513   HDL 39 (L) 11/24/2015 1127   CHOLHDL 4.4 04/13/2018 0513   VLDL 27 04/13/2018 0513   LDLCALC 86 04/13/2018 0513   LDLCALC 79 11/24/2015 1127     RADIOLOGY: No results found.   Other studies Reviewed: LHC 04/14/2018: Conclusion     Mid RCA lesion is 65% stenosed.  Previously placed Dist RCA-1 stent (unknown type) is widely patent.  Dist RCA-2 lesion is 99% stenosed.  Ost RPDA to RPDA lesion is 95% stenosed.  Ost 1st Mrg to 1st Mrg lesion is 80% stenosed.  Prox Cx lesion is 80% stenosed.  Prox LAD to Mid LAD lesion is 10% stenosed.  Mid LAD to Dist LAD lesion is 80% stenosed.  Dist LAD lesion is 90% stenosed.  Post intervention, there is a 20% residual stenosis.  Post intervention, there is a 15% residual stenosis.  Post  intervention, there is a 0% residual stenosis.  A stent was successfully placed.  Post intervention, there is a 0% residual stenosis.  Post intervention, there is a 0% residual stenosis.  Mid LAD lesion is 80% stenosed.  A stent was successfully placed.  Post intervention, there is a 0% residual stenosis.  Post intervention, there is a 0% residual stenosis.  A stent was successfully placed.  Successful multilesion/multivessel PCI to the RCA and LAD vessels.  Cutting Balloon/PTCA to the mid PDA vessel of the RCA with a 95% stenosis being reduced to 15%, and Cutting Balloon/PTCA/DES stenting with a 2.5 x 12 mm Resolute Onyx DES stent inserted in the distal RCA immediately proximal to the takeoff of the PDA vessel with stent overlap to the previously placed stent and postdilatation up to 2.75 mm with the 99% stenosis being reduced to 0%.  Three lesion intervention to the LAD with PTCA of the apical 90% LAD stenosis reduced to less than 30%, PTCA/DES stenting with a 3.0 x 22 mm Resolute Onyx DES stent postdilated to 3.25 mm with the 80% stenosis being reduced to 0% in the 80% stenosis just distal to the old LAD stent treated with PTCA/DES stenting with a 3.0 x 18 mm Resolute Onyx DES stent postdilated 3.3 mm with the 80% stenosis being reduced to 0%.  LVEDP preintervention was 15 mmHg.  RECOMMENDATION: The patient will be being maintained on dual antiplatelet therapy probably indefinitely due to significant multi-vessel disease and intervention. Increase medical therapy for concomitant CAD. Patient will be hydrated post procedure. Consider discharge over the weekend on increased medical therapy. If recurrent symptoms develop consider staged PCI to his circumflex marginal and AV groove circumflex vessels.  __________  LHC 04/12/2018: Conclusion     The left ventricular ejection fraction is 50-55% by visual estimate.  The left ventricular systolic function is normal.  LV  end diastolic pressure is mildly elevated. LVEDP 16 mm Hg.  There is no aortic valve stenosis.  Previously placed Dist RCA-1 stent (unknown type) is widely patent.  Dist RCA-2 lesion is 95% stenosed.  RPDA lesion is 80% stenosed.  Prox Cx lesion is 80% stenosed.  Ost 1st Mrg to 1st Mrg lesion is 80% stenosed.  Previously placed Mid LAD-1 stent (unknown type) is widely patent.  Mid LAD-2 lesion is  75% stenosed.  Ost 2nd Diag to 2nd Diag lesion is 75% stenosed.  Dist LAD lesion is 75% stenosed.  Ost 1st Diag lesion is 50% stenosed.  Prox LAD lesion is 25% stenosed.  Multivessel CAD. Plan for cardiac surgery consult. He does have diffuse distal LAD disease which is not optimal for LIMA to LAD. If he is not a candidate for CABG, would plan to bring back for PCI.      IMPRESSION:  1. Coronary artery disease involving native coronary artery of native heart with other form of angina pectoris ([place])   2. Essential hypertension   3. Pulmonary fibrosis ([place])   4. Hyperlipidemia LDL goal <70   5. Lower extremity edema      ASSESSMENT AND PLAN: Mr. Prieur is a 74 year old Caucasian male who is 21 years since his initial intervention to his RCA and PDA and 13 years since stenting to his LAD and distal RCA.  He has a history of hypertension.   Due to his significant occupational exposure, remote tobacco, and abnormal PFTs and CT imaging he is felt most likely to have interstitial lung disease. He developed recurrent unstable anginal symptomatology and was found to have progressive multivessel CAD.  He was turned down for CABG revascularization.  He underwent complex multi lesion multivessel PCI by me on April 14, 2018 at several sites in his RCA and LAD.  At the time he also had concomitant circumflex and circumflex and marginal disease which was not intervened upon.  He had felt significantly improved with reference to symptoms but over the last several weeks prior to his  November 2020 evaluation he had developed some mild chest pain and mild increasing shortness of breath he had been started on Esbriet for pulmonary fibrosis.  During that evaluation medications were titrated.  He did feel improved with increased medical therapy but did experience an episode of chest pain several weeks ago which was responsive to sublingual nitroglycerin.  He continues to be on amlodipine 5 mg I am recommending further titration of isosorbide to 60 mg daily and he will continue to be on metoprolol tartrate 50 mg twice a day.  He has been taking torsemide on a as needed basis.  On exam today he does have 2+ leg swelling bilaterally left greater than right I recommended titration to of torsemide to 20 mg daily.  I also recommended support stockings 20 to 30 cm.  He continues to be on atorvastatin 80 mg for hyperlipidemia.  He continues to be on DAPT with aspirin/Plavix.  He is on Esbriet for his pulmonary fibrosis.  He is mildly obese with BMI of 32.8.  Weight loss was recommended.  I will see him in 2 to 3 months for reevaluation or sooner if needed.   Troy Sine, MD, [facility]  11/19/2019 6:57 PM

## 2019-11-16 NOTE — Patient Instructions (Signed)
Medication Instructions:  INCREASE YOUR TORSEMIDE TO 20MG  DAILY. IF ADDITIONAL SWELLING MAY TAKE AN ADDITIONAL DOSE IN THE AFTERNOON.  INCREASE IMDUR TO 60MG  DAILY  *If you need a refill on your cardiac medications before your next appointment, please call your pharmacy*  Follow-Up: At [facility], you and your health needs are our priority.  As part of our continuing mission to provide you with exceptional heart care, we have created designated Provider Care Teams.  These Care Teams include your primary Cardiologist (physician) and Advanced Practice Providers (APPs -  Physician Assistants and Nurse Practitioners) who all work together to provide you with the care you need, when you need it.  We recommend signing up for the patient portal called "MyChart".  Sign up information is provided on this After Visit Summary.  MyChart is used to connect with patients for Virtual Visits (Telemedicine).  Patients are able to view lab/test results, encounter notes, upcoming appointments, etc.  Non-urgent messages can be sent to your provider as well.   To learn more about what you can do with MyChart, go to [email or web address].    Your next appointment:   3 month(s)  The format for your next appointment:   In Person  Provider:   Shelva Majestic, MD   Other Instructions [place] 20-30MM

## 2019-11-19 ENCOUNTER — Encounter: Payer: Self-pay | Admitting: Cardiovascular Disease

## 2019-11-19 DIAGNOSIS — I129 Hypertensive chronic kidney disease with stage 1 through stage 4 chronic kidney disease, or unspecified chronic kidney disease: Secondary | ICD-10-CM | POA: Diagnosis not present

## 2019-11-19 DIAGNOSIS — I2510 Atherosclerotic heart disease of native coronary artery without angina pectoris: Secondary | ICD-10-CM | POA: Diagnosis not present

## 2019-11-19 DIAGNOSIS — E7849 Other hyperlipidemia: Secondary | ICD-10-CM | POA: Diagnosis not present

## 2019-11-19 DIAGNOSIS — N1831 Chronic kidney disease, stage 3a: Secondary | ICD-10-CM | POA: Diagnosis not present

## 2019-11-27 NOTE — Addendum Note (Signed)
Addended by: Wonda Horner on: 11/27/2019 02:43 PM   Modules accepted: Orders

## 2019-12-06 ENCOUNTER — Other Ambulatory Visit: Payer: Self-pay | Admitting: Cardiovascular Disease

## 2019-12-11 ENCOUNTER — Telehealth: Payer: Self-pay | Admitting: Pulmonary Disease

## 2019-12-11 DIAGNOSIS — J84112 Idiopathic pulmonary fibrosis: Secondary | ICD-10-CM

## 2019-12-11 MED ORDER — ESBRIET 801 MG PO TABS: 1.0000 | ORAL_TABLET | Freq: Three times a day (TID) | ORAL | 5 refills | Status: DC

## 2019-12-11 NOTE — Telephone Encounter (Signed)
Called patient, refill for Esbriet sent to preferred pharmacy.

## 2019-12-16 NOTE — Progress Notes (Signed)
@Patient  ID: Burlene Arnt, male    DOB: [birth year], 73 y.o.   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  Although there is some occupational exposures he has not had ongoing exposure since 2000.  He does not have significant exposure history or signs and symptoms of connective tissue disease.  CTD serologies significant for borderline rheumatoid factor which is nonsignificant. Discussed at multidisciplinary conference diagnosis of IPF   PMH: Gout, nausea, CAD, chronic kidney disease, hypertension, history non-STEMI Smoker/ Smoking History: Former smoker.  Quit 1990.  75-pack-year smoking history. Maintenance: Esbriet Pt of: Dr. Vaughan Browner  Pets: Dog.  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  No ongoing exposures.  No mold, hot tub, Jacuzzi or down pillows or comforters Travel history: No significant travel history Relevant family history: Father died of lung cancer.  He was a smoker.   12/17/2019  - Visit   74 year old male former smoker followed in our office for interstitial lung disease.  Patient complaining follow-up with your office since last being seen in May/2021 by Dr. Hassell Done.  At that time was recommended that he remain on Esbriet.  They recheck hepatic function panel.  And that patient follow-up in 1 month.  Patient was having occasional nausea and fatigue with Esbriet.  If the symptoms were to persist we could consider switching to Ofev.  If labs/LFTs are stable today they can draw his incision to 62-month follow-ups with our office.  Patient presenting to our office today as a  1 month follow-up.  He reports that his fatigue and nausea persist.  He is using Zofran twice a day.  He continues to tolerate Esbriet.  He reports that occasionally after eating a large breakfast he is full at lunch she still eats a small meal so he can take is Esbriet.  His weights are clinically stable.  He is not weighing himself at home.  Overall patient feels that he is at his baseline.  Patient reports that he is fairly active and still goes into town for breakfast every day.  He is unsure when he should be using his oxygen.  Patient completed walk today in office.  He required 4 L continuous with physical exertion.  Patient presented to our office on room air.   Questionaires / Pulmonary Flowsheets:   ACT:  No flowsheet data found.  MMRC: mMRC Dyspnea Scale mMRC Score  12/17/2019 2    Epworth:  No flowsheet data found.  Tests:   Imaging: CT high-resolution February 05, 2015 patchy areas of groundglass attenuation, septal thickening with mild basal gradient. CT chest high-resolution 05/09/2016-subpleural reticulation, mild traction bronchiolectasis, mild honeycombing.  Definite UIP pattern high-resolution  CT chest high-resolution 04/24/2019-progression of fibrotic lung lung disease and definite UIP pattern.  PFTs: 04/29/2017 FVC 2.66 [55%], FEV1 2.35 [66%], F/F 88, TLC 4.10 [55%], DLCO 15.60 [44%] Moderate restriction with severe diffusion defect.  Worsening lung function compared to 2016.  05/28/2019 FVC 2.34 [49%], FEV1 1.96 [56%], F/F 84, TLC 3.50 [47%], DLCO 12.72 [46%] Severe restriction, diffusion defect.  Worsening lung volumes and diffusion capacity compared to 2008  Labs: CTD  serologies 02/23/2017-negative ANA, Jo 1, centromere antibody, rheumatoid factor, Ro, La, SCL 70  Repeat CTD serologies-negative ANA, CCP CK, aldolase, myositis panel, SCL 70, Ro, La Rheumatoid factor 16  Hepatic panel 08/01/2019-within normal limits except for total bilirubin of 1.4   FENO:   No results found for: NITRICOXIDE  PFT: PFT Results Latest Ref Rng & Units 06/07/2019 04/29/2017 10/07/2014  FVC-Pre L 2.49 2.71 2.91  FVC-Predicted Pre % 52 56 59  FVC-Post L 2.34 2.66 2.90  FVC-Predicted Post % 49 55 59  Pre FEV1/FVC % % 83 86 86  Post FEV1/FCV % % 84 88 89  FEV1-Pre L 2.07 2.32 2.50  FEV1-Predicted Pre % 59 65 68  FEV1-Post L 1.96 2.35 2.57  DLCO UNC% % 46 44 45  DLCO COR %Predicted % 93 91 83  TLC L 3.50 4.10 4.72  TLC % Predicted % 47 55 63  RV % Predicted % 48 81 73    WALK:  SIX MIN WALK 05/18/2017  Medications oxycodone, metoprolol, asa, allopurinol taken at 7:30 am  Supplimental Oxygen during Test? (L/min) No  Laps 3  Partial Lap (in Meters) 0  Baseline BP (sitting) 118/70  Baseline Heartrate 65  Baseline Dyspnea (Borg Scale) 1  Baseline Fatigue (Borg Scale) 3  Baseline SPO2 100  BP (sitting) 130/66  Heartrate 89  Dyspnea (Borg Scale) 4  Fatigue (Borg Scale) 4  SPO2 93  BP (sitting) 126/70  Heartrate 70  SPO2 99  Stopped or Paused before Six Minutes Yes  Other Symptoms at end of Exercise stopped with 2:30 min remaining due to SOB and leg pain  Distance Completed 144  Tech Comments: pt walked with a cane. He had a recent fall and also spine surgery approx 1 month ago//lmr    Imaging: No results found.  Lab Results:  CBC    Component Value Date/Time   WBC 11.3 (H) 05/01/2019 1000   RBC 5.03 05/01/2019 1000   HGB 14.8 05/01/2019 1000   HCT 44.4 05/01/2019 1000   PLT 221.0 05/01/2019 1000   MCV 88.4 05/01/2019 1000   MCH 29.0 05/16/2018 0814   MCHC 33.3 05/01/2019 1000   RDW 15.8 (H) 05/01/2019 1000   LYMPHSABS 1.8 05/01/2019 1000   MONOABS 1.0 05/01/2019 1000   EOSABS 0.2 05/01/2019 1000   BASOSABS 0.1 05/01/2019 1000    BMET    Component Value Date/Time   NA 138 11/15/2019 0919   NA 143 11/24/2015 1127   K 4.3 11/15/2019 0919   CL 102 11/15/2019 0919   CO2 31 11/15/2019 0919   GLUCOSE 91 11/15/2019 0919   BUN 18  11/15/2019 0919   BUN 21 11/24/2015 1127   CREATININE 1.27 (H) 11/15/2019 0919   CALCIUM 9.8 11/15/2019 0919   GFRNONAA 56 (L) 11/15/2019 0919   GFRAA 65 11/15/2019 0919    BNP    Component Value Date/Time   BNP 195.0 (H) 04/11/2018 2113    ProBNP    Component Value Date/Time   PROBNP 34.0 09/14/2009 1427    Specialty Problems      Pulmonary Problems   IPF (idiopathic pulmonary fibrosis) ([place])     interstitial lung disease, 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  Although there is some occupational exposures he has not had ongoing exposure since 2000.  He does not have significant exposure history or signs and symptoms of connective tissue disease.  CTD serologies significant for borderline rheumatoid factor which is nonsignificant. Discussed at multidisciplinary conference diagnosis of IPF  Chronic respiratory failure with hypoxia (HCC)      Allergies  Allergen Reactions   Ibuprofen Itching    Motrin brand   Neosporin [Neomycin-Bacitracin Zn-Polymyx] Swelling   Penicillins Rash     Has patient had a PCN reaction causing immediate rash, facial/tongue/throat swelling, SOB or lightheadedness with hypotension: No Has patient had a PCN reaction causing severe rash involving mucus membranes or skin necrosis: No Has patient had a PCN reaction that required hospitalization: No Has patient had a PCN reaction occurring within the last 10 years: No If all of the above answers are "NO", then may proceed with Cephalosporin use.     Immunization History  Administered Date(s) Administered   Influenza, High Dose Seasonal PF 04/04/2017, 03/01/2019   Moderna SARS-COVID-2 Vaccination 08/02/2019, 08/31/2019    Past Medical History:  Diagnosis Date   Adrenal adenoma, left    small   Aortic atherosclerosis (HCC)    Back pain    Bilateral carotid artery stenosis followed by dr Claiborne Billings   per last carotid duplex [id number]  -- proximal LICA 38-45% ,  RICA 3-64%    CAD (coronary artery disease)    a. s/p prior intervention to RCA and PDA in 1999 b. DES to LAD and distal RCA in 2007   Cardiomegaly    Stable   Chronic constipation    Chronic pain syndrome    CKD (chronic kidney disease), stage III    DDD (degenerative disc disease), cervical    DDD (degenerative disc disease), lumbosacral    Dyspnea    Dyspnea on exertion    Gout    per pt last inflared episode 2015 approx.   Grade II diastolic dysfunction    H/O epistaxis    per pt sees ENT dr Benjamine Mola for cauterization (pt takes plavix)   History of acute pancreatitis 08/08/2014   History of kidney stones    HTN (hypertension)    Hyperlipidemia    Interstitial lung disease (HCC)    Left arm pain    s/p fall   Nocturia    Numbness of right foot    due to DDD lumbar   Peripheral edema    Pilonidal cyst    Pleural effusion on right    Pulmonary fibrosis (HCC)    followed by pcp-- last chest CT 04-08-2016   S/P drug eluting coronary stent placement 10/11/2005   mLAD and dRCA   Wears dentures    upper   Wears glasses     Tobacco History: Social History   Tobacco Use  Smoking Status Former Smoker   Packs/day: 3.00   Years: 25.00   Pack years: 75.00   Types: Cigarettes   Quit date: 11/27/1988   Years since quitting: 31.0  Smokeless Tobacco Former Systems developer   Quit date: 03/28/1989   Counseling given: Yes   Continue to not smoke  Outpatient Encounter Medications as of 12/17/2019  Medication Sig   allopurinol (ZYLOPRIM) 300 MG tablet Take 300 mg by mouth every morning.    amLODipine (NORVASC) 5 MG tablet TAKE ONE TABLET (5MG  TOTAL) BY MOUTH DAILY   aspirin EC 81 MG tablet Take 81 mg by mouth daily.   atorvastatin (LIPITOR) 80 MG tablet TAKE ONE TABLET (80MG  TOTAL) BY MOUTH AT6PM   b complex vitamins tablet Take 1 tablet by mouth daily.   benzonatate (TESSALON) 200 MG capsule TAKE ONE CAPSULE (200MG  TOTAL) BY MOUTH TWO TIMES DAILY AS NEEDED FOR  COUGH   cholecalciferol (VITAMIN D) 1000 units tablet  Take 5,000 Units by mouth every morning.    clopidogrel (PLAVIX) 75 MG tablet TAKE ONE (1) TABLET BY MOUTH EVERY DAY   isosorbide mononitrate (IMDUR) 60 MG 24 hr tablet Take 1 tablet (60 mg total) by mouth daily.   metoprolol tartrate (LOPRESSOR) 50 MG tablet Take 1 tablet (50 mg total) by mouth 2 (two) times daily.   ondansetron (ZOFRAN) 4 MG tablet Take 1 tablet (4 mg total) by mouth every 8 (eight) hours as needed for nausea or vomiting.   oxyCODONE-acetaminophen (PERCOCET) 10-325 MG per tablet Take 1 tablet by mouth every 8 (eight) hours as needed for pain.    Pirfenidone (ESBRIET) 801 MG TABS Take 1 tablet by mouth 3 (three) times daily with meals.   tiZANidine (ZANAFLEX) 4 MG tablet Take 4 mg by mouth at bedtime.    torsemide (DEMADEX) 20 MG tablet Take 1 tablet (20 mg total) by mouth daily. IF ADDITIONAL SWELLING, MAY TAKE AN ADDITIONAL DOSE IN THE AFTERNOON   nitroGLYCERIN (NITROSTAT) 0.4 MG SL tablet Place 1 tablet (0.4 mg total) under the tongue every 5 (five) minutes as needed for chest pain.   No facility-administered encounter medications on file as of 12/17/2019.     Review of Systems  Review of Systems  Constitutional: Positive for fatigue. Negative for activity change, chills, fever and unexpected weight change.  HENT: Negative for postnasal drip, rhinorrhea, sinus pressure, sinus pain and sore throat.   Eyes: Negative.   Respiratory: Positive for cough. Negative for shortness of breath and wheezing.   Cardiovascular: Negative for chest pain and palpitations.  Gastrointestinal: Positive for nausea. Negative for constipation, diarrhea and vomiting.  Endocrine: Negative.   Genitourinary: Negative.   Musculoskeletal: Negative.   Skin: Negative.   Neurological: Negative for dizziness and headaches.  Psychiatric/Behavioral: Negative.  Negative for dysphoric mood. The patient is not nervous/anxious.   All other  systems reviewed and are negative.    Physical Exam  BP 124/78 (BP Location: Left Arm, Cuff Size: Normal)    Pulse 86    Temp 98 F (36.7 C) (Oral)    Ht 6' (1.829 m)    Wt 248 lb 6.4 oz (112.7 kg)    SpO2 92%    BMI 33.69 kg/m   Wt Readings from Last 5 Encounters:  12/17/19 248 lb 6.4 oz (112.7 kg)  11/16/19 248 lb 3.2 oz (112.6 kg)  11/15/19 249 lb 12.8 oz (113.3 kg)  10/16/19 249 lb (112.9 kg)  09/17/19 248 lb (112.5 kg)    BMI Readings from Last 5 Encounters:  12/17/19 33.69 kg/m  11/16/19 32.75 kg/m  11/15/19 33.88 kg/m  10/16/19 33.77 kg/m  09/17/19 33.63 kg/m     Physical Exam Vitals and nursing note reviewed.  Constitutional:      General: He is not in acute distress.    Appearance: Normal appearance. He is obese.  HENT:     Head: Normocephalic and atraumatic.     Right Ear: Hearing and external ear normal.     Left Ear: Hearing and external ear normal.     Nose: Nose normal. No mucosal edema or rhinorrhea.     Right Turbinates: Not enlarged.     Left Turbinates: Not enlarged.     Mouth/Throat:     Mouth: Mucous membranes are dry.     Pharynx: Oropharynx is clear. No oropharyngeal exudate.  Eyes:     Pupils: Pupils are equal, round, and reactive to light.  Cardiovascular:     Rate  and Rhythm: Normal rate and regular rhythm.     Pulses: Normal pulses.     Heart sounds: Normal heart sounds. No murmur heard.   Pulmonary:     Effort: Pulmonary effort is normal.     Breath sounds: Rales (Bibasilar crackles) present. No decreased breath sounds or wheezing.  Musculoskeletal:     Cervical back: Normal range of motion.     Right lower leg: No edema.     Left lower leg: No edema.  Lymphadenopathy:     Cervical: No cervical adenopathy.  Skin:    General: Skin is warm and dry.     Capillary Refill: Capillary refill takes less than 2 seconds.     Findings: No erythema or rash.  Neurological:     General: No focal deficit present.     Mental Status: He is  alert and oriented to person, place, and time.     Motor: No weakness.     Coordination: Coordination normal.     Gait: Gait abnormal (Tolerated walk in office, uses cane).  Psychiatric:        Mood and Affect: Mood normal.        Behavior: Behavior normal. Behavior is cooperative.        Thought Content: Thought content normal.        Judgment: Judgment normal.       Assessment & Plan:   IPF (idiopathic pulmonary fibrosis) (HCC) Plan: Continue Esbriet We will repeat spirometry with DLCO in 3 months Continue to monitor weight Okay to continue to use Zofran for management of nausea If weight starts to drop or nausea and fatigue worsen despite Zofran use please contact our office Could consider switch to Ofev Lab work today Can consider pulmonary rehab referral if the patient would like Patient to increase physical activity Follow-up with Dr. Vaughan Browner in 3 months  Chronic respiratory failure with hypoxia ([place]) Walk today in office Patient required 4 L of O2 continuous Patient presented to our office on room air  Plan: Offered oxygen at discharge from visit today, patient declined Patient reports that he is a tank in the car Patient has POC at home as well as portable tanks as well as a concentrator Emphasized need to wear oxygen to maintain oxygen saturations above 88% Emphasized importance the patient that if he is not wearing oxygen needs but himself at increased risk for acute complications    Therapeutic drug monitoring Plan: Lab work today    Return in about 3 months (around 03/18/2020), or if symptoms worsen or fail to improve, for Follow up for PFT - 86min, Spiro with DLCO, Follow up with Dr. Vaughan Browner.   Lauraine Rinne, NP 12/17/2019   This appointment required 34 minutes of patient care (this includes precharting, chart review, review of results, face-to-face care, etc.).

## 2019-12-17 ENCOUNTER — Other Ambulatory Visit (HOSPITAL_COMMUNITY): Payer: Self-pay | Admitting: Physician Assistant

## 2019-12-17 ENCOUNTER — Other Ambulatory Visit: Payer: Self-pay

## 2019-12-17 ENCOUNTER — Encounter: Payer: Self-pay | Admitting: Pulmonary Disease

## 2019-12-17 ENCOUNTER — Ambulatory Visit (INDEPENDENT_AMBULATORY_CARE_PROVIDER_SITE_OTHER): Payer: Medicare Other | Admitting: Pulmonary Disease

## 2019-12-17 VITALS — BP 124/78 | HR 86 | Temp 98.0°F | Ht 72.0 in | Wt 248.4 lb

## 2019-12-17 DIAGNOSIS — J84112 Idiopathic pulmonary fibrosis: Secondary | ICD-10-CM | POA: Diagnosis not present

## 2019-12-17 DIAGNOSIS — Z5181 Encounter for therapeutic drug level monitoring: Secondary | ICD-10-CM | POA: Diagnosis not present

## 2019-12-17 DIAGNOSIS — J9611 Chronic respiratory failure with hypoxia: Secondary | ICD-10-CM

## 2019-12-17 LAB — COMPREHENSIVE METABOLIC PANEL
ALT: 20 U/L (ref 0–53)
AST: 28 U/L (ref 0–37)
Albumin: 4.6 g/dL (ref 3.5–5.2)
Alkaline Phosphatase: 113 U/L (ref 39–117)
BUN: 28 mg/dL — ABNORMAL HIGH (ref 6–23)
CO2: 30 mEq/L (ref 19–32)
Calcium: 9.9 mg/dL (ref 8.4–10.5)
Chloride: 101 mEq/L (ref 96–112)
Creatinine, Ser: 1.43 mg/dL (ref 0.40–1.50)
GFR: 48.62 mL/min — ABNORMAL LOW (ref >60.00)
Glucose, Bld: 113 mg/dL — ABNORMAL HIGH (ref 70–99)
Potassium: 4.2 mEq/L (ref 3.5–5.1)
Sodium: 140 mEq/L (ref 135–145)
Total Bilirubin: 0.8 mg/dL (ref 0.2–1.2)
Total Protein: 7.7 g/dL (ref 6.0–8.3)

## 2019-12-17 LAB — HEPATIC FUNCTION PANEL
ALT: 20 U/L (ref 0–53)
AST: 28 U/L (ref 0–37)
Albumin: 4.6 g/dL (ref 3.5–5.2)
Alkaline Phosphatase: 113 U/L (ref 39–117)
Bilirubin, Direct: 0.2 mg/dL (ref 0.0–0.3)
Total Bilirubin: 0.8 mg/dL (ref 0.2–1.2)
Total Protein: 7.7 g/dL (ref 6.0–8.3)

## 2019-12-17 NOTE — Assessment & Plan Note (Signed)
Plan: Lab work today 

## 2019-12-17 NOTE — Assessment & Plan Note (Addendum)
Plan: Continue Esbriet We will repeat spirometry with DLCO in 3 months Continue to monitor weight Okay to continue to use Zofran for management of nausea If weight starts to drop or nausea and fatigue worsen despite Zofran use please contact our office Could consider switch to Ofev Lab work today Can consider pulmonary rehab referral if the patient would like Patient to increase physical activity Follow-up with Dr. Vaughan Browner in 3 months

## 2019-12-17 NOTE — Assessment & Plan Note (Signed)
Walk today in office Patient required 4 L of O2 continuous Patient presented to our office on room air  Plan: Offered oxygen at discharge from visit today, patient declined Patient reports that he is a tank in the car Patient has POC at home as well as portable tanks as well as a concentrator Emphasized need to wear oxygen to maintain oxygen saturations above 88% Emphasized importance the patient that if he is not wearing oxygen needs but himself at increased risk for acute complications

## 2019-12-17 NOTE — Patient Instructions (Addendum)
You were seen today by Lauraine Rinne, NP  for:   1. IPF (idiopathic pulmonary fibrosis) (HCC)  - Comp Met (CMET); Future - Hepatic function panel; Future - Pulmonary function test; Future  Walk today in office >>> With physical exertion you required 4 L continuous  Continue oxygen therapy as prescribed  >>>maintain oxygen saturations greater than 88 percent  >>>if unable to maintain oxygen saturations please contact the office  >>>do not smoke with oxygen  >>>can use nasal saline gel or nasal saline rinses to moisturize nose if oxygen causes dryness  Continue Esbriet  Notify our office if your weights are decreasing or if your nausea and fatigue are worsening >>> We recommend weighing yourself every day in the morning after going to the bathroom, prior to eating, and getting dressed  Okay to continue Zofran as needed for your nausea  Work on increasing your overall mobility  We can always refer you to pulmonary rehab which they can complete at [facility] if you become interested this to be a 16-week exercise course working on increasing your overall mobility  We will plan on repeating a breathing test on you in 3 months prior to seeing Dr. Vaughan Browner again this will be a 30-minute breathing test to monitor your lung functioning and compared to the December/2020 pulmonary function test  2. Therapeutic drug monitoring  - Comp Met (CMET); Future - Hepatic function panel; Future   We recommend today:  Orders Placed This Encounter  Procedures  . Comp Met (CMET)    Standing Status:   Future    Standing Expiration Date:   12/16/2020  . Hepatic function panel    Standing Status:   Future    Standing Expiration Date:   12/16/2020  . Pulmonary function test    Arlyce Harman with dlco    Standing Status:   Future    Standing Expiration Date:   12/16/2020    Order Specific Question:   Where should this test be performed?    Answer:   [facility]   Orders Placed This Encounter    Procedures  . Comp Met (CMET)  . Hepatic function panel  . Pulmonary function test   No orders of the defined types were placed in this encounter.   Follow Up:    Return in about 3 months (around 03/18/2020), or if symptoms worsen or fail to improve, for Follow up for PFT - 55mn, Spiro with DLCO, Follow up with Dr. MVaughan Browner   Please do your part to reduce the spread of COVID-19:      Reduce your risk of any infection  and COVID19 by using the similar precautions used for avoiding the common cold or flu:  .Marland KitchenWash your hands often with soap and warm water for at least 20 seconds.  If soap and water are not readily available, use an alcohol-based hand sanitizer with at least 60% alcohol.  . If coughing or sneezing, cover your mouth and nose by coughing or sneezing into the elbow areas of your shirt or coat, into a tissue or into your sleeve (not your hands). .Langley GaussA MASK when in public  . Avoid shaking hands with others and consider head nods or verbal greetings only. . Avoid touching your eyes, nose, or mouth with unwashed hands.  . Avoid close contact with people who are sick. . Avoid places or events with large numbers of people in one location, like concerts or sporting events. . If you have some  symptoms but not all symptoms, continue to monitor at home and seek medical attention if your symptoms worsen. . If you are having a medical emergency, call 911.   [place] / e-Visit: [email or web address]         [facility]: [facility]: [phone]                   [facility]: [phone]     It is flu season:   >>> Best ways to protect herself from the flu: Receive the yearly flu vaccine, practice good hand hygiene washing with soap and also using hand sanitizer when available, eat a nutritious meals, get adequate rest,  hydrate appropriately   Please contact the office if your symptoms worsen or you have concerns that you are not improving.   Thank you for choosing  [facility] for your healthcare, and for allowing [place] to partner with you on your healthcare journey. I am thankful to be able to provide care to you today.   Wyn Quaker FNP-C

## 2019-12-19 DIAGNOSIS — N1831 Chronic kidney disease, stage 3a: Secondary | ICD-10-CM | POA: Diagnosis not present

## 2019-12-19 DIAGNOSIS — I2510 Atherosclerotic heart disease of native coronary artery without angina pectoris: Secondary | ICD-10-CM | POA: Diagnosis not present

## 2019-12-19 DIAGNOSIS — E7849 Other hyperlipidemia: Secondary | ICD-10-CM | POA: Diagnosis not present

## 2019-12-19 DIAGNOSIS — I129 Hypertensive chronic kidney disease with stage 1 through stage 4 chronic kidney disease, or unspecified chronic kidney disease: Secondary | ICD-10-CM | POA: Diagnosis not present

## 2020-01-14 ENCOUNTER — Other Ambulatory Visit: Payer: Self-pay

## 2020-01-14 DIAGNOSIS — G894 Chronic pain syndrome: Secondary | ICD-10-CM | POA: Diagnosis not present

## 2020-01-14 DIAGNOSIS — Z130 Encounter for screening for diseases of the blood and blood-forming organs and certain disorders involving the immune mechanism: Secondary | ICD-10-CM | POA: Diagnosis not present

## 2020-01-14 DIAGNOSIS — R972 Elevated prostate specific antigen [PSA]: Secondary | ICD-10-CM | POA: Diagnosis not present

## 2020-01-14 DIAGNOSIS — E6609 Other obesity due to excess calories: Secondary | ICD-10-CM | POA: Diagnosis not present

## 2020-01-14 DIAGNOSIS — E7849 Other hyperlipidemia: Secondary | ICD-10-CM | POA: Diagnosis not present

## 2020-01-14 DIAGNOSIS — J449 Chronic obstructive pulmonary disease, unspecified: Secondary | ICD-10-CM | POA: Diagnosis not present

## 2020-01-14 DIAGNOSIS — Z6834 Body mass index (BMI) 34.0-34.9, adult: Secondary | ICD-10-CM | POA: Diagnosis not present

## 2020-01-14 DIAGNOSIS — E782 Mixed hyperlipidemia: Secondary | ICD-10-CM | POA: Diagnosis not present

## 2020-01-18 DIAGNOSIS — E7849 Other hyperlipidemia: Secondary | ICD-10-CM | POA: Diagnosis not present

## 2020-01-18 DIAGNOSIS — N1831 Chronic kidney disease, stage 3a: Secondary | ICD-10-CM | POA: Diagnosis not present

## 2020-01-18 DIAGNOSIS — I129 Hypertensive chronic kidney disease with stage 1 through stage 4 chronic kidney disease, or unspecified chronic kidney disease: Secondary | ICD-10-CM | POA: Diagnosis not present

## 2020-01-18 DIAGNOSIS — I2510 Atherosclerotic heart disease of native coronary artery without angina pectoris: Secondary | ICD-10-CM | POA: Diagnosis not present

## 2020-02-11 DIAGNOSIS — Z6835 Body mass index (BMI) 35.0-35.9, adult: Secondary | ICD-10-CM | POA: Diagnosis not present

## 2020-02-11 DIAGNOSIS — M791 Myalgia, unspecified site: Secondary | ICD-10-CM | POA: Diagnosis not present

## 2020-02-11 DIAGNOSIS — M5137 Other intervertebral disc degeneration, lumbosacral region: Secondary | ICD-10-CM | POA: Diagnosis not present

## 2020-02-11 DIAGNOSIS — G894 Chronic pain syndrome: Secondary | ICD-10-CM | POA: Diagnosis not present

## 2020-02-11 DIAGNOSIS — E6609 Other obesity due to excess calories: Secondary | ICD-10-CM | POA: Diagnosis not present

## 2020-02-19 DIAGNOSIS — E7849 Other hyperlipidemia: Secondary | ICD-10-CM | POA: Diagnosis not present

## 2020-02-19 DIAGNOSIS — N1831 Chronic kidney disease, stage 3a: Secondary | ICD-10-CM | POA: Diagnosis not present

## 2020-02-19 DIAGNOSIS — I129 Hypertensive chronic kidney disease with stage 1 through stage 4 chronic kidney disease, or unspecified chronic kidney disease: Secondary | ICD-10-CM | POA: Diagnosis not present

## 2020-02-19 DIAGNOSIS — I2510 Atherosclerotic heart disease of native coronary artery without angina pectoris: Secondary | ICD-10-CM | POA: Diagnosis not present

## 2020-02-20 ENCOUNTER — Encounter: Payer: Self-pay | Admitting: Cardiovascular Disease

## 2020-02-20 ENCOUNTER — Ambulatory Visit (INDEPENDENT_AMBULATORY_CARE_PROVIDER_SITE_OTHER): Payer: Medicare Other | Admitting: Cardiovascular Disease

## 2020-02-20 ENCOUNTER — Other Ambulatory Visit: Payer: Self-pay

## 2020-02-20 VITALS — BP 104/64 | HR 62 | Ht 72.0 in | Wt 252.0 lb

## 2020-02-20 DIAGNOSIS — E785 Hyperlipidemia, unspecified: Secondary | ICD-10-CM

## 2020-02-20 DIAGNOSIS — J8410 Pulmonary fibrosis, unspecified: Secondary | ICD-10-CM | POA: Diagnosis not present

## 2020-02-20 DIAGNOSIS — R600 Localized edema: Secondary | ICD-10-CM | POA: Diagnosis not present

## 2020-02-20 DIAGNOSIS — I25119 Atherosclerotic heart disease of native coronary artery with unspecified angina pectoris: Secondary | ICD-10-CM | POA: Diagnosis not present

## 2020-02-20 NOTE — Patient Instructions (Signed)

## 2020-02-20 NOTE — Progress Notes (Signed)
Patient ID: JOMARI BARTNIK, male   DOB: 12-30-1945, 74 y.o.   MRN: [id number]      HPI: RIVEN MABILE, is a 74 y.o. male who presents to the office today for a 3 month cardiology followup evaluation.     Mr. Timathy Newberry has established CAD dating back to 1999 when he underwent intervention to his RCA and PDA. In April 2007 he underwent stenting to his LAD and distal RCA at which time Cypher DES stents were inserted. He has a history of hypertension, obesity, hyperlipidemia, gout, and  lower extremity edema.   A two-year followup nuclear perfusion study in March 2014 was essentially unchanged from his prior study and continued to show fairly normal perfusion and was low risk, without evidence for scar. There was no significant ischemia.  In 2015 he had of the Plavix for 3 months due to injury to his right leg.  He has noted some intermittent leg swelling.  He had carotids Doppler study in January 2016 which showed 50-60% left proximal ICA diameter reduction, which had slightly increased from his prior study one year previously.  Because of a history of coughing up blood with pleuritic chest pain he underwent a CT [place] of his chest on March 21 which was negative for PE but showed diffuse peripheral lung densities throughout both upper and lower zones without honeycombing raising the [name] concern for mild fibrotic changes but the pattern was nonspecific.    Over the last several years he has lost over 40 pounds from his peak weight.  He  was seen in the emergency room with lightheadedness and was hypotensive.  He was told to discontinue his isosorbide as well as lisinopril.   In the setting of his hypotension he was found to have an increased creatinine at 1.4 recently had been 1.1. He has been followed by Dr.Befakadu in [place] for his renal issues.  Over the past year, he has experienced occasional episodes of chest pain which he describes as tightness.  Typically, this can occur with  exertion.  At times he also notices a chest burning.  He admits to shortness of breath.  He has been evaluated by [name] for pulmonary fibrosis.  He has an extensive occupational history infrequently had worked in Lobbyist, Engineer, materials, and chrome plating for many years and was often exposed to significant dust inhalation.  He also had worked in Industrial/product designer.  There is remote tobacco history of 2-3 packs per day for 20-25 years, but unfortunately he quit in 1980s.  He is felt most likely to have interstitial lung disease and continues further evaluation.  He has had difficulty with his low back.  He has been on lisinopril 5 mg, metoprolol 50 mg twice a day.  He continues to take simvastatin 20 mg for hyperlipidemia.  He is been maintained on aspirin and Plavix.  There is remote history of gout, which is controlled with allopurinol.  When I saw him on February 2019, he had noticed episodes of chest burning as well as chest tightness and continues to experience exertional dyspnea.  I initiated amlodipine at 5 mg.  I strongly recommended support stockings for his potential edema.  He was given a prescription for sublabel nitroglycerin.  He underwent a follow-up [place] study of 08/12/2017 which was low risk and without ischemia.  EF was 57%.  There was minimal apical thinning most likely contributed by his body habitus.  He underwent an echo Doppler study on 10/14/2017 which showed  an EF of 60-65% and grade 2 diastolic dysfunction.  There was moderate pulmonary hypertension with PA pressure at 46 mm.    At evaluation in March 2019 he was feeling better.  He denied recurrent chest pain or shortness of breath.  He is lost 21 pounds in 3 weeks purposefully.   He developed recurrent chest pain symptomatology and developed unstable angina symptoms leading to hospitalization with a non-ST segment elevation MI.  On April 12, 2018 he underwent diagnostic catheterization by Dr. Irish Lack due to multivessel CAD he  underwent surgical consultation for consideration of CABG surgery but was turned down.  As result, on April 14, 2018, I performed successful multi lesion/multivessel PCI to the RCA and LAD vessels.  He underwent Cutting Balloon PTCA to the mid PDA vessel of the RCA with a 95% stenosis being reduced to 15% and Cutting Balloon PTCA DES stenting with a 2.5 x12 mm Resolute stent inserted into the distal RCA immediately proximal to the takeoff of the PDA vessel with stent overlap to the previously placed stent with a 99% stenosis reduced to 0%.  He also underwent 3 lesion intervention to the LAD with PTCA of the 90% apical LAD stenosis, PTCA DES stenting at 2 additional study sites in the LAD successfully.  He still had residual concomitant 80% AV groove circumflex and circumflex marginal disease which was treated medically.  Mr. Hoecker has noted significant improvement following his multilesion multivessel intervention to his RCA and LAD.  He has been seen in the office since by [name], NP as well as [name], PA.  When last seen by [name], he reduced his amlodipine since his blood pressure was low and he also had 2+ pitting edema for which he recommended restarting his previous torsemide daily.  I  saw him in November 2020.  He underwent an echo Doppler study 24 2020 which showed normal systolic with EF at 60 to 65%, mild LVH diastolic parameters were indeterminate.  He had moderately elevated pulmonary artery systolic pressure at 42 mm.    He was last evaluated by me on Nov 16, 2019.  He had done well since his last evaluation but over the past 2 weeks has again begun to notice some recurrent episodes of chest pain and shortness of breath.  He also has been diagnosed with pulmonary fibrosis.  He continues to experience mild leg swelling.  During that evaluation I recommended initiating isosorbide mononitrate 30 mg.  He had been on low-dose amlodipine at 2.5 mg and I recommended further titration of 5  mg.  He continues to be on metoprolol tartrate 25 mg twice a day and remained on DAPT with aspirin/Plavix.   He was to follow-up with Dr. Marshell Garfinkel for his pulmonary fibrosis.  He has been started on Esbriet for his pulmonary fibrosis.  He has noticed leg swelling bilaterally left greater than right.  Approximately 2 weeks ago he took sublingual nitroglycerin with improvement of his mild chest pain.  During that evaluation, he was on amlodipine 5 mg and I recommended further titration of isosorbide to 60 mg daily and that he continue metoprolol tartrate 50 mg twice a day.  He had 2+ bilateral leg swelling and I recommended torsemide 20 mg daily and support stockings.  Over the past several months, he has been fairly stable.  He denies any recurrent episodes of chest pain on his increased medical regimen.  He continues to be on supplemental oxygen for his pulmonary fibrosis.  He continues to  experience some leg swelling right greater than left.  He has not been using support stockings.  He presents for evaluation   Past Medical History:  Diagnosis Date  . Adrenal adenoma, left    small  . Aortic atherosclerosis ([place])   . Back pain   . Bilateral carotid artery stenosis followed by dr Claiborne Billings   per last carotid duplex [id number]  -- proximal LICA 31-43% ,  RICA 8-88%  . CAD (coronary artery disease)    a. s/p prior intervention to RCA and PDA in 1999 b. DES to LAD and distal RCA in 2007  . Cardiomegaly    Stable  . Chronic constipation   . Chronic pain syndrome   . CKD (chronic kidney disease), stage III   . DDD (degenerative disc disease), cervical   . DDD (degenerative disc disease), lumbosacral   . Dyspnea   . Dyspnea on exertion   . Gout    per pt last inflared episode 2015 approx.  . Grade II diastolic dysfunction   . H/O epistaxis    per pt sees ENT dr Benjamine Mola for cauterization (pt takes plavix)  . History of acute pancreatitis 08/08/2014  . History of kidney stones   . HTN  (hypertension)   . Hyperlipidemia   . Interstitial lung disease ([place])   . Left arm pain    s/p fall  . Nocturia   . Numbness of right foot    due to DDD lumbar  . Peripheral edema   . Pilonidal cyst   . Pleural effusion on right   . Pulmonary fibrosis ([place])    followed by pcp-- last chest CT 04-08-2016  . S/P drug eluting coronary stent placement 10/11/2005   mLAD and dRCA  . Wears dentures    upper  . Wears glasses     Past Surgical History:  Procedure Laterality Date  . ANTERIOR CERVICAL DECOMP/DISCECTOMY FUSION  12-25-2001     dr Orinda Kenner [facility]   C3 -- C7  . ANTERIOR CERVICAL DECOMP/DISCECTOMY FUSION  12-11-2012   dr Carloyn Manner  [facility])  . CARDIOVASCULAR STRESS TEST  08-23-2012    dr Claiborne Billings   normal lexiscan nuclear study w/ no ischemia/  normal LV function and wall motion, ef 65%  . CARDIOVASCULAR STRESS TEST    . CORONARY ANGIOPLASTY  1999   PTCA to RCA & PDA  . CORONARY ANGIOPLASTY WITH STENT PLACEMENT  10-11-2005  dr Shelva Majestic   PCI and stenting to [place] (Cypher DES x2)  . CORONARY BALLOON ANGIOPLASTY N/A 04/14/2018   Procedure: CORONARY BALLOON ANGIOPLASTY;  Surgeon: Troy Sine, MD;  Location: [place] [facility];  Service: Cardiovascular;  Laterality: N/A;  . CORONARY STENT INTERVENTION N/A 04/14/2018   Procedure: CORONARY STENT INTERVENTION;  Surgeon: Troy Sine, MD;  Location: [place] [facility];  Service: Cardiovascular;  Laterality: N/A;  . CYSTOSCOPY W/ URETERAL STENT PLACEMENT Left 02-11-2010     [facility]  . LEFT HEART CATH N/A 04/14/2018   Procedure: Left Heart Cath;  Surgeon: Troy Sine, MD;  Location: [place] [facility];  Service: Cardiovascular;  Laterality: N/A;  . LEFT HEART CATH AND CORONARY ANGIOGRAPHY N/A 04/12/2018   Procedure: LEFT HEART CATH AND CORONARY ANGIOGRAPHY;  Surgeon: Jettie Booze, MD;  Location: [place] [facility];  Service: Cardiovascular;  Laterality: N/A;  . LUMBAR LAMINECTOMY  2015   L5 -- S1  .  PILONIDAL CYST EXCISION  1980s  . PILONIDAL CYST EXCISION  N/A 03/31/2017   Procedure: EXCISION OF PILONIDAL DISEASE with Flap Rotation;  Surgeon: Leighton Ruff, MD;  Location: [facility];  Service: General;  Laterality: N/A;  . THROAT SURGERY  1996   per pt "removal piece of cryloid(?) that was pressing against throat"  states no issues since  . TRANSTHORACIC ECHOCARDIOGRAM  08-24-2010  dr Claiborne Billings   ef 50-55%/  mild MR/  trivial TR  . WOUND DEBRIDEMENT N/A 10/06/2017   Procedure: DEBRIDEMENT WOUND PLACEMENT OF WOUND HEALING MATRIX;  Surgeon: Leighton Ruff, MD;  Location: [facility];  Service: General;  Laterality: N/A;    Allergies  Allergen Reactions  . Ibuprofen Itching    Motrin brand  . Neosporin [Neomycin-Bacitracin Zn-Polymyx] Swelling  . Penicillins Rash     Has patient had a PCN reaction causing immediate rash, facial/tongue/throat swelling, SOB or lightheadedness with hypotension: No Has patient had a PCN reaction causing severe rash involving mucus membranes or skin necrosis: No Has patient had a PCN reaction that required hospitalization: No Has patient had a PCN reaction occurring within the last 10 years: No If all of the above answers are "NO", then may proceed with Cephalosporin use.     Current Outpatient Medications  Medication Sig Dispense Refill  . allopurinol (ZYLOPRIM) 300 MG tablet Take 300 mg by mouth every morning.     Marland Kitchen amLODipine (NORVASC) 5 MG tablet TAKE ONE TABLET (5MG TOTAL) BY MOUTH DAILY 90 tablet 3  . aspirin EC 81 MG tablet Take 81 mg by mouth daily.    Marland Kitchen atorvastatin (LIPITOR) 80 MG tablet TAKE ONE TABLET (80MG TOTAL) BY MOUTH AT6PM 90 tablet 3  . b complex vitamins tablet Take 1 tablet by mouth daily.    . benzonatate (TESSALON) 200 MG capsule TAKE ONE CAPSULE (200MG TOTAL) BY MOUTH TWO TIMES DAILY AS NEEDED FOR COUGH 60 capsule 2  . cholecalciferol (VITAMIN D) 1000 units tablet Take 5,000 Units by mouth every  morning.     . clopidogrel (PLAVIX) 75 MG tablet TAKE ONE (1) TABLET BY MOUTH EVERY DAY 90 tablet 2  . isosorbide mononitrate (IMDUR) 60 MG 24 hr tablet Take 1 tablet (60 mg total) by mouth daily. 90 tablet 3  . metoprolol tartrate (LOPRESSOR) 50 MG tablet Take 1 tablet (50 mg total) by mouth 2 (two) times daily. 180 tablet 2  . nitroGLYCERIN (NITROSTAT) 0.4 MG SL tablet Place 1 tablet (0.4 mg total) under the tongue every 5 (five) minutes as needed for chest pain. 25 tablet 3  . ondansetron (ZOFRAN) 4 MG tablet Take 1 tablet (4 mg total) by mouth every 8 (eight) hours as needed for nausea or vomiting. 90 tablet 5  . oxyCODONE-acetaminophen (PERCOCET) 10-325 MG per tablet Take 1 tablet by mouth every 8 (eight) hours as needed for pain.     . Pirfenidone (ESBRIET) 801 MG TABS Take 1 tablet by mouth 3 (three) times daily with meals. 90 tablet 5  . tiZANidine (ZANAFLEX) 4 MG tablet Take 4 mg by mouth at bedtime.     . torsemide (DEMADEX) 20 MG tablet Take 1 tablet (20 mg total) by mouth daily. IF ADDITIONAL SWELLING, MAY TAKE AN ADDITIONAL DOSE IN THE AFTERNOON 90 tablet 3   No current facility-administered medications for this visit.    Socially he is married. There are no children. Has a remote tobacco history having quit approximately 16 years ago. He's not very active. He does not routinely exercise.  ROS General: Negative; No fevers,  chills, or night sweats;  HEENT: Negative; No changes in vision or hearing, sinus congestion, difficulty swallowing Pulmonary: Positive for pulmonary fibrosis Cardiovascular: See history of present illness GI: Negative; No nausea, vomiting, diarrhea, or abdominal pain GU: Negative; No dysuria, hematuria, or difficulty voiding Musculoskeletal: Negative; no myalgias, joint pain, or weakness Hematologic/Oncology: Negative; no easy bruising, bleeding Endocrine: Negative; no heat/cold intolerance; no diabetes Neuro: Negative; no changes in balance,  headaches Skin: Negative; No rashes or skin lesions Psychiatric: Negative; No behavioral problems, depression Sleep: Negative; No snoring, daytime sleepiness, hypersomnolence, bruxism, restless legs, hypnogognic hallucinations, no cataplexy Other comprehensive 14 point system review is negative.   PE BP 104/64   Pulse 62   Ht 6' (1.829 m)   Wt 252 lb (114.3 kg)   SpO2 96%   BMI 34.18 kg/m    Repeat blood pressure by me was 110/68  Wt Readings from Last 3 Encounters:  02/20/20 252 lb (114.3 kg)  12/17/19 248 lb 6.4 oz (112.7 kg)  11/16/19 248 lb 3.2 oz (112.6 kg)   General: Alert, oriented, no distress.  Skin: normal turgor, no rashes, warm and dry HEENT: Normocephalic, atraumatic. Pupils equal round and reactive to light; sclera anicteric; extraocular muscles intact;  Nose without nasal septal hypertrophy Mouth/Parynx benign; Mallinpatti scale 3 Neck: No JVD, no carotid bruits; normal carotid upstroke Lungs: clear to ausculatation and percussion; no wheezing or rales Chest wall: without tenderness to palpitation Heart: PMI not displaced, RRR, s1 s2 normal, 1/6 systolic murmur, no diastolic murmur, no rubs, gallops, thrills, or heaves Abdomen: soft, nontender; no hepatosplenomehaly, BS+; abdominal aorta nontender and not dilated by palpation. Back: no CVA tenderness Pulses 2+ Musculoskeletal: full range of motion, normal strength, no joint deformities Extremities: 2+ right lower extremity edema and 1+ left lower extremity edema no clubbing cyanosis, Homan's sign negative  Neurologic: grossly nonfocal; Cranial nerves grossly wnl Psychologic: Normal mood and affect      ECG (independently read by me): NSR 62; no STT changes; normal intervals  May 2021 ECG (independently read by me): NSR at 66; no ST changes; normal intervals  November 2020 ECG (independently read by me): Normal sinus rhythm at 75 bpm.  No ectopy.  Normal intervals  March 2019 ECG (independently read by  me): Bradycardia 57 bpm.  No ectopy.  Normal intervals.  February 2018 ECG (independently read by me): Sinus bradycardia 57 bpm.  No ectopy.  Normal intervals.  January 2018 ECG (independently read by me): Sinus rhythm at 60 bpm.  No ectopy.  Normal intervals.  June 2017 ECG (independently read by me): Normal sinus rhythm at 61 bpm.  No ectopy.  Normal intervals.  April 2016 ECG (independently read by me): Normal sinus rhythm at 67 bpm.  No ectopy.  Normal intervals.  October 2014 ECG: Normal sinus rhythm at 63 beats per minute. No significant ST abnormalities. Normal intervals.  LABS:  BMP Latest Ref Rng & Units 12/17/2019 11/15/2019 05/01/2019  Glucose 70 - 99 mg/dL 113(H) 91 112(H)  BUN 6 - 23 mg/dL 28(H) 18 19  Creatinine 0.40 - 1.50 mg/dL 1.43 1.27(H) 1.18  BUN/Creat Ratio 6 - 22 (calc) - 14 -  Sodium 135 - 145 mEq/L 140 138 139  Potassium 3.5 - 5.1 mEq/L 4.2 4.3 4.0  Chloride 96 - 112 mEq/L 101 102 102  CO2 19 - 32 mEq/L _0 Calcium 8.4 - 10.5 mg/dL 9.9 9.8 9.6    Hepatic Function Latest Ref Rng & Units 12/17/2019 12/17/2019 11/15/2019  Total Protein 6.0 - 8.3 g/dL 7.7 7.7 7.0  Albumin 3.5 - 5.2 g/dL 4.6 4.6 -  AST 0 - 37 U/L _0 ALT 0 - 53 U/L _1 Alk Phosphatase 39 - 117 U/L 113 113 -  Total Bilirubin 0.2 - 1.2 mg/dL 0.8 0.8 1.1  Bilirubin, Direct 0.0 - 0.3 mg/dL - 0.2 -   CBC Latest Ref Rng & Units 05/01/2019 05/16/2018 04/15/2018  WBC 4.0 - 10.5 K/uL 11.3(H) 8.1 10.5  Hemoglobin 13.0 - 17.0 g/dL 14.8 13.5 12.7(L)  Hematocrit 39 - 52 % 44.4 42.4 39.3  Platelets 150 - 400 K/uL 221.0 215 198   Lab Results  Component Value Date   MCV 88.4 05/01/2019   MCV 91.0 05/16/2018   MCV 90.8 04/15/2018    Lab Results  Component Value Date   TSH 1.150 11/24/2015   Lipid Panel     Component Value Date/Time   CHOL 146 04/13/2018 0513   CHOL 144 11/24/2015 1127   TRIG 133 04/13/2018 0513   HDL 33 (L) 04/13/2018 0513   HDL 39 (L) 11/24/2015 1127    CHOLHDL 4.4 04/13/2018 0513   VLDL 27 04/13/2018 0513   LDLCALC 86 04/13/2018 0513   LDLCALC 79 11/24/2015 1127     RADIOLOGY: No results found.   Other studies Reviewed: LHC 04/14/2018: Conclusion     Mid RCA lesion is 65% stenosed.  Previously placed Dist RCA-1 stent (unknown type) is widely patent.  Dist RCA-2 lesion is 99% stenosed.  Ost RPDA to RPDA lesion is 95% stenosed.  Ost 1st Mrg to 1st Mrg lesion is 80% stenosed.  Prox Cx lesion is 80% stenosed.  Prox LAD to Mid LAD lesion is 10% stenosed.  Mid LAD to Dist LAD lesion is 80% stenosed.  Dist LAD lesion is 90% stenosed.  Post intervention, there is a 20% residual stenosis.  Post intervention, there is a 15% residual stenosis.  Post intervention, there is a 0% residual stenosis.  A stent was successfully placed.  Post intervention, there is a 0% residual stenosis.  Post intervention, there is a 0% residual stenosis.  Mid LAD lesion is 80% stenosed.  A stent was successfully placed.  Post intervention, there is a 0% residual stenosis.  Post intervention, there is a 0% residual stenosis.  A stent was successfully placed.  Successful multilesion/multivessel PCI to the RCA and LAD vessels.  Cutting Balloon/PTCA to the mid PDA vessel of the RCA with a 95% stenosis being reduced to 15%, and Cutting Balloon/PTCA/DES stenting with a 2.5 x 12 mm Resolute Onyx DES stent inserted in the distal RCA immediately proximal to the takeoff of the PDA vessel with stent overlap to the previously placed stent and postdilatation up to 2.75 mm with the 99% stenosis being reduced to 0%.  Three lesion intervention to the LAD with PTCA of the apical 90% LAD stenosis reduced to less than 30%, PTCA/DES stenting with a 3.0 x 22 mm Resolute Onyx DES stent postdilated to 3.25 mm with the 80% stenosis being reduced to 0% in the 80% stenosis just distal to the old LAD stent treated with PTCA/DES stenting with a 3.0 x 18 mm  Resolute Onyx DES stent postdilated 3.3 mm with the 80% stenosis being reduced to 0%.  LVEDP preintervention was 15 mmHg.  RECOMMENDATION: The patient will be being maintained on dual antiplatelet therapy probably indefinitely due to significant multi-vessel disease and intervention. Increase medical therapy for concomitant CAD. Patient will be  hydrated post procedure. Consider discharge over the weekend on increased medical therapy. If recurrent symptoms develop consider staged PCI to his circumflex marginal and AV groove circumflex vessels.  __________  LHC 04/12/2018: Conclusion     The left ventricular ejection fraction is 50-55% by visual estimate.  The left ventricular systolic function is normal.  LV end diastolic pressure is mildly elevated. LVEDP 16 mm Hg.  There is no aortic valve stenosis.  Previously placed Dist RCA-1 stent (unknown type) is widely patent.  Dist RCA-2 lesion is 95% stenosed.  RPDA lesion is 80% stenosed.  Prox Cx lesion is 80% stenosed.  Ost 1st Mrg to 1st Mrg lesion is 80% stenosed.  Previously placed Mid LAD-1 stent (unknown type) is widely patent.  Mid LAD-2 lesion is 75% stenosed.  Ost 2nd Diag to 2nd Diag lesion is 75% stenosed.  Dist LAD lesion is 75% stenosed.  Ost 1st Diag lesion is 50% stenosed.  Prox LAD lesion is 25% stenosed.  Multivessel CAD. Plan for cardiac surgery consult. He does have diffuse distal LAD disease which is not optimal for LIMA to LAD. If he is not a candidate for CABG, would plan to bring back for PCI.      IMPRESSION:  1. Coronary artery disease involving native coronary artery of native heart with angina pectoris ([place])   2. Hyperlipidemia LDL goal <70   3. Lower extremity edema   4. Pulmonary fibrosis ([place])      ASSESSMENT AND PLAN: Mr. Mcfadden is a 74 year old Caucasian male who is 21 years since his initial intervention to his RCA and PDA and 13 years since stenting to his LAD and  distal RCA.  He has a history of hypertension.   Due to his significant occupational exposure, remote tobacco, and abnormal PFTs and CT imaging he is felt most likely to have interstitial lung disease. He developed recurrent unstable anginal symptomatology and was found to have progressive multivessel CAD.  He was turned down for CABG revascularization.  He underwent complex multi lesion multivessel PCI by me on April 14, 2018 at several sites in his RCA and LAD.  At the time he also had concomitant circumflex and circumflex and marginal disease which was not intervened upon.  He had felt significantly improved with reference to symptoms but over the last several weeks prior to his November 2020 evaluation he had developed some mild chest pain and mild increasing shortness of breath he had been started on Esbriet for pulmonary fibrosis.  During that evaluation medications were titrated.  At his last evaluation in May 2028 due to a recurrent episode of chest pain I recommended further titration of isosorbide to 60 mg.  He has done well on this and denies any recurrent symptomatology.  ECG is stable and he continues to be on metoprolol tartrate 50 mg twice a day in addition to amlodipine 5 mg.  He continues to have leg swelling and is now on torsemide daily.  I recommended against stockings to be used.  He continues to be on oxygen supplementation particularly when active for his pulmonary fibrosis.  He is on Esbriet followed by Dr. Vaughan Browner.  He is on atorvastatin 80 mg for hyperlipidemia.  LFTs in June were normal.  LDL cholesterol was outstanding at 34 in January 14, 2020.  He will continue current therapy.  I will see him in 6 months for reevaluation or sooner as needed.  Troy Sine, MD, [facility]  02/23/2020 3:01 PM

## 2020-02-23 ENCOUNTER — Encounter: Payer: Self-pay | Admitting: Cardiovascular Disease

## 2020-03-17 DIAGNOSIS — Z6835 Body mass index (BMI) 35.0-35.9, adult: Secondary | ICD-10-CM | POA: Diagnosis not present

## 2020-03-17 DIAGNOSIS — Z23 Encounter for immunization: Secondary | ICD-10-CM | POA: Diagnosis not present

## 2020-03-17 DIAGNOSIS — E6609 Other obesity due to excess calories: Secondary | ICD-10-CM | POA: Diagnosis not present

## 2020-03-17 DIAGNOSIS — I700 Atherosclerosis of aorta: Secondary | ICD-10-CM | POA: Diagnosis not present

## 2020-03-17 DIAGNOSIS — G894 Chronic pain syndrome: Secondary | ICD-10-CM | POA: Diagnosis not present

## 2020-03-18 ENCOUNTER — Ambulatory Visit (INDEPENDENT_AMBULATORY_CARE_PROVIDER_SITE_OTHER): Payer: Medicare Other | Admitting: Pulmonary Disease

## 2020-03-18 ENCOUNTER — Encounter: Payer: Self-pay | Admitting: Primary Care

## 2020-03-18 ENCOUNTER — Other Ambulatory Visit: Payer: Self-pay

## 2020-03-18 ENCOUNTER — Ambulatory Visit: Payer: Medicare Other | Admitting: Pulmonary Disease

## 2020-03-18 ENCOUNTER — Telehealth: Payer: Self-pay

## 2020-03-18 ENCOUNTER — Ambulatory Visit (INDEPENDENT_AMBULATORY_CARE_PROVIDER_SITE_OTHER): Payer: Medicare Other | Admitting: Primary Care

## 2020-03-18 VITALS — BP 126/64 | HR 65 | Temp 97.0°F | Ht 72.0 in | Wt 257.0 lb

## 2020-03-18 DIAGNOSIS — J84112 Idiopathic pulmonary fibrosis: Secondary | ICD-10-CM

## 2020-03-18 DIAGNOSIS — I25119 Atherosclerotic heart disease of native coronary artery with unspecified angina pectoris: Secondary | ICD-10-CM

## 2020-03-18 DIAGNOSIS — R600 Localized edema: Secondary | ICD-10-CM

## 2020-03-18 DIAGNOSIS — J9611 Chronic respiratory failure with hypoxia: Secondary | ICD-10-CM | POA: Diagnosis not present

## 2020-03-18 DIAGNOSIS — R0600 Dyspnea, unspecified: Secondary | ICD-10-CM | POA: Diagnosis not present

## 2020-03-18 LAB — PULMONARY FUNCTION TEST
DL/VA % pred: 101 %
DL/VA: 4.04 ml/min/mmHg/L
DLCO cor % pred: 50 %
DLCO cor: 13.76 ml/min/mmHg
DLCO unc % pred: 50 %
DLCO unc: 13.76 ml/min/mmHg
FEF 25-75 Pre: 2.12 L/sec
FEF2575-%Pred-Pre: 82 %
FEV1-%Pred-Pre: 56 %
FEV1-Pre: 1.94 L
FEV1FVC-%Pred-Pre: 113 %
FEV6-%Pred-Pre: 52 %
FEV6-Pre: 2.32 L
FEV6FVC-%Pred-Pre: 105 %
FVC-%Pred-Pre: 49 %
FVC-Pre: 2.33 L
Pre FEV1/FVC ratio: 83 %
Pre FEV6/FVC Ratio: 100 %

## 2020-03-18 LAB — COMPREHENSIVE METABOLIC PANEL
ALT: 21 U/L (ref 0–53)
AST: 24 U/L (ref 0–37)
Albumin: 4.1 g/dL (ref 3.5–5.2)
Alkaline Phosphatase: 98 U/L (ref 39–117)
BUN: 15 mg/dL (ref 6–23)
CO2: 34 mEq/L — ABNORMAL HIGH (ref 19–32)
Calcium: 9.5 mg/dL (ref 8.4–10.5)
Chloride: 102 mEq/L (ref 96–112)
Creatinine, Ser: 1.4 mg/dL (ref 0.40–1.50)
GFR: 49.79 mL/min — ABNORMAL LOW (ref >60.00)
Glucose, Bld: 90 mg/dL (ref 70–99)
Potassium: 4.2 mEq/L (ref 3.5–5.1)
Sodium: 138 mEq/L (ref 135–145)
Total Bilirubin: 1 mg/dL (ref 0.2–1.2)
Total Protein: 7.1 g/dL (ref 6.0–8.3)

## 2020-03-18 LAB — BRAIN NATRIURETIC PEPTIDE: Pro B Natriuretic peptide (BNP): 67 pg/mL (ref 0.0–100.0)

## 2020-03-18 NOTE — Telephone Encounter (Signed)
Pt was informed of labs and to continue the esbriet medication

## 2020-03-18 NOTE — Progress Notes (Signed)
Spirometry/DLCO performed today. 

## 2020-03-18 NOTE — Progress Notes (Signed)
Please let patient know liver panel was normal. Continue Esbriet. (he has mychart but does not use)

## 2020-03-18 NOTE — Patient Instructions (Addendum)
Recommendations: Continue Esbriet 801mg  three times a day Continue 4L continuous oxygen on exertion/ 5L when using POC   Orders: Labs today (liver panel and BNP) Spirometry with DLCO in 6 months   Follow-up: 3 months with Dr. Vaughan Browner

## 2020-03-18 NOTE — Progress Notes (Signed)
@Patient  ID: Maurice Munoz, male    DOB: December 05, 1945, 74 y.o.   MRN: [id number]  Chief Complaint  Patient presents with  . Follow-up    Pt states he has been doing okay since last visit. States he still becomes SOB with activities and has an occ cough. DME: [facility] for oxygen using 5 pulse via POC and 2-3L at home with concentrator    Referring provider: Redmond School, MD  HPI: 74 year old male, former smoker quit in [place] (75 pack year hx). 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 He does not have significant exposure or s/s connective tissue disease. CTD serologies borderline for RF which is not clinically significant. Discussed at multidisciplinary conference diagnosed with IPF.  PMH: Gout, nausea, CAD, chronic kidney disease, hypertension, history non-STEMI Smoker/ Smoking History: Former smoker.  Quit 1990.  75-pack-year smoking history. Maintenance: Esbriet Pt of: Dr. Vaughan Browner  Pets: Dog.  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  No ongoing exposures.  No mold, hot tub, Jacuzzi or down pillows or comforters Travel history: No significant travel history Relevant family history: Father died of lung cancer.  He was a smoker.  Previous [facility] pulmonary encounter: 12/17/2019  - Visit  74 year old male former smoker followed in our office for interstitial lung disease.  Patient complaining follow-up with your office since last being seen in May/2021 by Dr. Hassell Done.  At that time was recommended that he remain on Esbriet.  They recheck hepatic function panel.  And that patient follow-up in 1 month.  Patient was having occasional nausea and fatigue with Esbriet.  If the symptoms were to persist we could consider switching to Ofev.  If labs/LFTs are stable today they can draw his incision to 45-month follow-ups with our  office.  Patient presenting to our office today as a 1 month follow-up.  He reports that his fatigue and nausea persist.  He is using Zofran twice a day.  He continues to tolerate Esbriet.  He reports that occasionally after eating a large breakfast he is full at lunch she still eats a small meal so he can take is Esbriet.  His weights are clinically stable.  He is not weighing himself at home.  Overall patient feels that he is at his baseline.  Patient reports that he is fairly active and still goes into town for breakfast every day.  He is unsure when he should be using his oxygen.  Patient completed walk today in office.  He required 4 L continuous with physical exertion.  Patient presented to our office on room air.   03/18/2020- Interim hx Patient presents today for follow-up with Spirometry. He is doing well. Reports that side effect from antifibrotic medication are not as bad as they used to be. He has some mild nausea and decrease appetite around lunch time. He continues to wear oxygen on exertion. He uses 5L on POC.  He is prescribedTorsemide 20mg  daily but states that he can not take diuretic every day d/t muscle cramping. Cardiology is aware and told him to take diuretic every other day. He reports 7 lb weight gain. Denies weight loss, diarrhea or vomiting.    Spirometry 03/18/2020 - FVC 2.33 (49%), FEV1 1.94 (56%), ratio 83, DLCOcor 13.76 (50%)  Dec 2020- FVC 2.49 (52%), FEV1 2.07 (59%), ratio 83, DLCOunc 12.72 (46%)   Allergies  Allergen Reactions  . Ibuprofen Itching    Motrin brand  . Neosporin [Neomycin-Bacitracin Zn-Polymyx] Swelling  . Penicillins Rash     Has patient had a PCN reaction causing immediate rash, facial/tongue/throat swelling, SOB or lightheadedness with hypotension: No Has patient had a PCN reaction causing severe rash involving mucus membranes or skin necrosis: No Has patient had a PCN reaction that required hospitalization: No Has patient had a PCN  reaction occurring within the last 10 years: No If all of the above answers are "NO", then may proceed with Cephalosporin use.     Immunization History  Administered Date(s) Administered  . Influenza, High Dose Seasonal PF 04/04/2017, 03/01/2019  . Influenza-Unspecified 03/17/2020  . Moderna SARS-COVID-2 Vaccination 08/02/2019, 08/31/2019    Past Medical History:  Diagnosis Date  . Adrenal adenoma, left    small  . Aortic atherosclerosis ([place])   . Back pain   . Bilateral carotid artery stenosis followed by dr Claiborne Billings   per last carotid duplex [phone]  -- proximal LICA 93-79% ,  RICA 0-24%  . CAD (coronary artery disease)    a. s/p prior intervention to RCA and PDA in 1999 b. DES to LAD and distal RCA in 2007  . Cardiomegaly    Stable  . Chronic constipation   . Chronic pain syndrome   . CKD (chronic kidney disease), stage III   . DDD (degenerative disc disease), cervical   . DDD (degenerative disc disease), lumbosacral   . Dyspnea   . Dyspnea on exertion   . Gout    per pt last inflared episode 2015 approx.  . Grade II diastolic dysfunction   . H/O epistaxis    per pt sees ENT dr Benjamine Mola for cauterization (pt takes plavix)  . History of acute pancreatitis 08/08/2014  . History of kidney stones   . HTN (hypertension)   . Hyperlipidemia   . Interstitial lung disease ([place])   . Left arm pain    s/p fall  . Nocturia   . Numbness of right foot    due to DDD lumbar  . Peripheral edema   . Pilonidal cyst   . Pleural effusion on right   . Pulmonary fibrosis ([place])    followed by pcp-- last chest CT 04-08-2016  . S/P drug eluting coronary stent placement 10/11/2005   mLAD and dRCA  . Wears dentures    upper  . Wears glasses     Tobacco History: Social History   Tobacco Use  Smoking Status Former Smoker  . Packs/day: 3.00  . Years: 25.00  . Pack years: 75.00  . Types: Cigarettes  . Quit date: 11/27/1988  . Years since quitting: 31.3  Smokeless Tobacco Former Systems developer   . Quit date: 03/28/1989   Counseling given: Not Answered   Outpatient Medications Prior to Visit  Medication Sig Dispense Refill  . allopurinol (ZYLOPRIM) 300 MG tablet Take 300 mg by mouth every morning.     Marland Kitchen amLODipine (NORVASC) 5 MG tablet TAKE ONE TABLET (5MG  TOTAL) BY MOUTH DAILY 90 tablet 3  . aspirin EC 81 MG tablet Take 81 mg by mouth daily.    Marland Kitchen atorvastatin (LIPITOR) 80 MG tablet TAKE ONE TABLET (80MG  TOTAL) BY MOUTH AT6PM 90 tablet 3  . b complex vitamins tablet Take 1 tablet by mouth daily.    . benzonatate (TESSALON) 200 MG capsule TAKE ONE CAPSULE (200MG  TOTAL) BY MOUTH TWO TIMES DAILY AS NEEDED FOR COUGH 60 capsule 2  . cholecalciferol (VITAMIN D)  1000 units tablet Take 5,000 Units by mouth every morning.     . clopidogrel (PLAVIX) 75 MG tablet TAKE ONE (1) TABLET BY MOUTH EVERY DAY 90 tablet 2  . isosorbide mononitrate (IMDUR) 60 MG 24 hr tablet Take 1 tablet (60 mg total) by mouth daily. 90 tablet 3  . metoprolol tartrate (LOPRESSOR) 50 MG tablet Take 1 tablet (50 mg total) by mouth 2 (two) times daily. 180 tablet 2  . ondansetron (ZOFRAN) 4 MG tablet Take 1 tablet (4 mg total) by mouth every 8 (eight) hours as needed for nausea or vomiting. 90 tablet 5  . oxyCODONE-acetaminophen (PERCOCET) 10-325 MG per tablet Take 1 tablet by mouth every 8 (eight) hours as needed for pain.     . Pirfenidone (ESBRIET) 801 MG TABS Take 1 tablet by mouth 3 (three) times daily with meals. 90 tablet 5  . tiZANidine (ZANAFLEX) 4 MG tablet Take 4 mg by mouth at bedtime.     . torsemide (DEMADEX) 20 MG tablet Take 1 tablet (20 mg total) by mouth daily. IF ADDITIONAL SWELLING, MAY TAKE AN ADDITIONAL DOSE IN THE AFTERNOON 90 tablet 3  . nitroGLYCERIN (NITROSTAT) 0.4 MG SL tablet Place 1 tablet (0.4 mg total) under the tongue every 5 (five) minutes as needed for chest pain. 25 tablet 3   No facility-administered medications prior to visit.    Review of Systems  Review of Systems   Constitutional: Positive for appetite change and unexpected weight change.  Respiratory: Negative for cough, chest tightness and wheezing.        DOE  Cardiovascular: Positive for leg swelling.    Physical Exam  BP 126/64 (BP Location: Left Arm, Cuff Size: Large)   Pulse 65   Temp (!) 97 F (36.1 C) (Other (Comment)) Comment (Src): wrist  Ht 6' (1.829 m)   Wt 257 lb (116.6 kg)   SpO2 96%   BMI 34.86 kg/m  Physical Exam Constitutional:      Appearance: Normal appearance.  HENT:     Head: Normocephalic and atraumatic.     Mouth/Throat:     Comments: Deferred d/t masking Cardiovascular:     Rate and Rhythm: Normal rate and regular rhythm.     Comments: +3 BLE edema Pulmonary:     Effort: Pulmonary effort is normal.     Breath sounds: Rales present.     Comments: Fine crackles to bilateral lung bases Musculoskeletal:     Comments: Amb with cane  Neurological:     Mental Status: He is alert.  Psychiatric:        Mood and Affect: Mood normal.        Behavior: Behavior normal.        Thought Content: Thought content normal.        Judgment: Judgment normal.      Lab Results:  CBC    Component Value Date/Time   WBC 11.3 (H) 05/01/2019 1000   RBC 5.03 05/01/2019 1000   HGB 14.8 05/01/2019 1000   HCT 44.4 05/01/2019 1000   PLT 221.0 05/01/2019 1000   MCV 88.4 05/01/2019 1000   MCH 29.0 05/16/2018 0814   MCHC 33.3 05/01/2019 1000   RDW 15.8 (H) 05/01/2019 1000   LYMPHSABS 1.8 05/01/2019 1000   MONOABS 1.0 05/01/2019 1000   EOSABS 0.2 05/01/2019 1000   BASOSABS 0.1 05/01/2019 1000    BMET    Component Value Date/Time   NA 138 03/18/2020 1128   NA 143 11/24/2015 1127  K 4.2 03/18/2020 1128   CL 102 03/18/2020 1128   CO2 34 (H) 03/18/2020 1128   GLUCOSE 90 03/18/2020 1128   BUN 15 03/18/2020 1128   BUN 21 11/24/2015 1127   CREATININE 1.40 03/18/2020 1128   CREATININE 1.27 (H) 11/15/2019 0919   CALCIUM 9.5 03/18/2020 1128   GFRNONAA 56 (L) 11/15/2019  0919   GFRAA 65 11/15/2019 0919    BNP    Component Value Date/Time   BNP 195.0 (H) 04/11/2018 2113    ProBNP    Component Value Date/Time   PROBNP 67.0 03/18/2020 1128    Imaging: No results found.   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 He does not have significant exposure history. CTD serologies negative, borderline RF which is not of clinically significant.  - Spirometry today showed 3% decreased in lung function and diffusion capacity remains stable since December 2020.  - Continue Esbriet 801mg  TID, LFTs remain normal  - Repeat Spirometry with DLCO in 6 months  - FU in 3 months  Chronic respiratory failure with hypoxia (HCC) - Continue 4L continuous oxygen on exertion/ 5L when using [place], NP 03/21/2020

## 2020-03-20 DIAGNOSIS — E7849 Other hyperlipidemia: Secondary | ICD-10-CM | POA: Diagnosis not present

## 2020-03-20 DIAGNOSIS — I2510 Atherosclerotic heart disease of native coronary artery without angina pectoris: Secondary | ICD-10-CM | POA: Diagnosis not present

## 2020-03-20 DIAGNOSIS — N1831 Chronic kidney disease, stage 3a: Secondary | ICD-10-CM | POA: Diagnosis not present

## 2020-03-20 DIAGNOSIS — I129 Hypertensive chronic kidney disease with stage 1 through stage 4 chronic kidney disease, or unspecified chronic kidney disease: Secondary | ICD-10-CM | POA: Diagnosis not present

## 2020-03-21 ENCOUNTER — Encounter: Payer: Self-pay | Admitting: Primary Care

## 2020-03-21 NOTE — Assessment & Plan Note (Signed)
-   Continue 4L continuous oxygen on exertion/ 5L when using POC

## 2020-03-21 NOTE — Assessment & Plan Note (Addendum)
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 He does not have significant exposure history. CTD serologies negative, borderline RF which is not of clinically significant.  - Spirometry today showed 3% decreased in lung function and diffusion capacity remains stable since December 2020.  - Continue Esbriet 801mg  TID, LFTs remain normal  - Repeat Spirometry with DLCO in 6 months  - FU in 3 months

## 2020-03-25 ENCOUNTER — Other Ambulatory Visit: Payer: Self-pay | Admitting: Cardiovascular Disease

## 2020-04-16 DIAGNOSIS — M48061 Spinal stenosis, lumbar region without neurogenic claudication: Secondary | ICD-10-CM | POA: Diagnosis not present

## 2020-04-16 DIAGNOSIS — I700 Atherosclerosis of aorta: Secondary | ICD-10-CM | POA: Diagnosis not present

## 2020-04-16 DIAGNOSIS — Z6835 Body mass index (BMI) 35.0-35.9, adult: Secondary | ICD-10-CM | POA: Diagnosis not present

## 2020-04-16 DIAGNOSIS — S46002S Unspecified injury of muscle(s) and tendon(s) of the rotator cuff of left shoulder, sequela: Secondary | ICD-10-CM | POA: Diagnosis not present

## 2020-04-16 DIAGNOSIS — Z0001 Encounter for general adult medical examination with abnormal findings: Secondary | ICD-10-CM | POA: Diagnosis not present

## 2020-04-16 DIAGNOSIS — Z1331 Encounter for screening for depression: Secondary | ICD-10-CM | POA: Diagnosis not present

## 2020-04-16 DIAGNOSIS — G894 Chronic pain syndrome: Secondary | ICD-10-CM | POA: Diagnosis not present

## 2020-04-19 DIAGNOSIS — N1831 Chronic kidney disease, stage 3a: Secondary | ICD-10-CM | POA: Diagnosis not present

## 2020-04-19 DIAGNOSIS — E7849 Other hyperlipidemia: Secondary | ICD-10-CM | POA: Diagnosis not present

## 2020-04-19 DIAGNOSIS — I2510 Atherosclerotic heart disease of native coronary artery without angina pectoris: Secondary | ICD-10-CM | POA: Diagnosis not present

## 2020-04-19 DIAGNOSIS — I129 Hypertensive chronic kidney disease with stage 1 through stage 4 chronic kidney disease, or unspecified chronic kidney disease: Secondary | ICD-10-CM | POA: Diagnosis not present

## 2020-05-01 DIAGNOSIS — N410 Acute prostatitis: Secondary | ICD-10-CM | POA: Diagnosis not present

## 2020-05-01 DIAGNOSIS — I209 Angina pectoris, unspecified: Secondary | ICD-10-CM | POA: Diagnosis not present

## 2020-05-01 DIAGNOSIS — E7849 Other hyperlipidemia: Secondary | ICD-10-CM | POA: Diagnosis not present

## 2020-05-01 DIAGNOSIS — J449 Chronic obstructive pulmonary disease, unspecified: Secondary | ICD-10-CM | POA: Diagnosis not present

## 2020-05-01 DIAGNOSIS — Z6834 Body mass index (BMI) 34.0-34.9, adult: Secondary | ICD-10-CM | POA: Diagnosis not present

## 2020-05-01 DIAGNOSIS — I10 Essential (primary) hypertension: Secondary | ICD-10-CM | POA: Diagnosis not present

## 2020-05-08 ENCOUNTER — Other Ambulatory Visit: Payer: Self-pay | Admitting: *Deleted

## 2020-05-08 ENCOUNTER — Telehealth: Payer: Self-pay | Admitting: Pulmonary Disease

## 2020-05-08 DIAGNOSIS — J84112 Idiopathic pulmonary fibrosis: Secondary | ICD-10-CM

## 2020-05-08 DIAGNOSIS — Z23 Encounter for immunization: Secondary | ICD-10-CM | POA: Diagnosis not present

## 2020-05-08 MED ORDER — ESBRIET 801 MG PO TABS: 1.0000 | ORAL_TABLET | Freq: Three times a day (TID) | ORAL | 5 refills | Status: DC

## 2020-05-08 NOTE — Telephone Encounter (Signed)
Spoke to patient, who is requesting refill on Esbriet 801. Per our records, Rx was sent to [facility] today at 1345. Patient is aware and voiced his understanding.  Nothing further needed.

## 2020-05-19 DIAGNOSIS — Z6834 Body mass index (BMI) 34.0-34.9, adult: Secondary | ICD-10-CM | POA: Diagnosis not present

## 2020-05-19 DIAGNOSIS — M48061 Spinal stenosis, lumbar region without neurogenic claudication: Secondary | ICD-10-CM | POA: Diagnosis not present

## 2020-05-19 DIAGNOSIS — M1991 Primary osteoarthritis, unspecified site: Secondary | ICD-10-CM | POA: Diagnosis not present

## 2020-05-19 DIAGNOSIS — G894 Chronic pain syndrome: Secondary | ICD-10-CM | POA: Diagnosis not present

## 2020-05-20 DIAGNOSIS — I129 Hypertensive chronic kidney disease with stage 1 through stage 4 chronic kidney disease, or unspecified chronic kidney disease: Secondary | ICD-10-CM | POA: Diagnosis not present

## 2020-05-20 DIAGNOSIS — N1831 Chronic kidney disease, stage 3a: Secondary | ICD-10-CM | POA: Diagnosis not present

## 2020-05-20 DIAGNOSIS — E7849 Other hyperlipidemia: Secondary | ICD-10-CM | POA: Diagnosis not present

## 2020-05-20 DIAGNOSIS — I2510 Atherosclerotic heart disease of native coronary artery without angina pectoris: Secondary | ICD-10-CM | POA: Diagnosis not present

## 2020-05-30 ENCOUNTER — Telehealth: Payer: Self-pay | Admitting: Radiology

## 2020-05-30 NOTE — Telephone Encounter (Signed)
I need referral notes in my inbox for appointment on Monday

## 2020-06-02 ENCOUNTER — Encounter: Payer: Self-pay | Admitting: Orthopedic Surgery

## 2020-06-02 ENCOUNTER — Ambulatory Visit: Payer: Medicare Other

## 2020-06-02 ENCOUNTER — Ambulatory Visit (INDEPENDENT_AMBULATORY_CARE_PROVIDER_SITE_OTHER): Payer: Medicare Other | Admitting: Orthopedic Surgery

## 2020-06-02 ENCOUNTER — Other Ambulatory Visit: Payer: Self-pay

## 2020-06-02 VITALS — BP 163/66 | HR 76 | Ht 72.0 in | Wt 250.0 lb

## 2020-06-02 DIAGNOSIS — M25562 Pain in left knee: Secondary | ICD-10-CM

## 2020-06-02 DIAGNOSIS — M11262 Other chondrocalcinosis, left knee: Secondary | ICD-10-CM | POA: Diagnosis not present

## 2020-06-02 DIAGNOSIS — I25119 Atherosclerotic heart disease of native coronary artery with unspecified angina pectoris: Secondary | ICD-10-CM

## 2020-06-02 DIAGNOSIS — G8929 Other chronic pain: Secondary | ICD-10-CM | POA: Diagnosis not present

## 2020-06-02 DIAGNOSIS — M1120 Other chondrocalcinosis, unspecified site: Secondary | ICD-10-CM

## 2020-06-02 DIAGNOSIS — M1712 Unilateral primary osteoarthritis, left knee: Secondary | ICD-10-CM

## 2020-06-02 DIAGNOSIS — M1710 Unilateral primary osteoarthritis, unspecified knee: Secondary | ICD-10-CM

## 2020-06-02 NOTE — Progress Notes (Signed)
Chief Complaint  Patient presents with  . Knee Pain    Left knee give away 3 weeks ago.   74 year old male with pulmonary fibrosis presents after twisting his left knee and falling about 3 weeks ago.  He says things are getting better however initially had a giving way symptom has occasional pain that shoots through the knee appears to come from posterior to medial to anterior  He does use a cane and says if he does not use it when the pain hits the knee could and does give out  System review history of left shoulder pain chronic back pain shortness of breath  Past Medical History:  Diagnosis Date  . Adrenal adenoma, left    small  . Aortic atherosclerosis ([place])   . Back pain   . Bilateral carotid artery stenosis followed by dr Claiborne Billings   per last carotid duplex [phone]  -- proximal LICA 92-11% ,  RICA 9-41%  . CAD (coronary artery disease)    a. s/p prior intervention to RCA and PDA in 1999 b. DES to LAD and distal RCA in 2007  . Cardiomegaly    Stable  . Chronic constipation   . Chronic pain syndrome   . CKD (chronic kidney disease), stage III ([place])   . DDD (degenerative disc disease), cervical   . DDD (degenerative disc disease), lumbosacral   . Dyspnea   . Dyspnea on exertion   . Gout    per pt last inflared episode 2015 approx.  . Grade II diastolic dysfunction   . H/O epistaxis    per pt sees ENT dr Benjamine Mola for cauterization (pt takes plavix)  . History of acute pancreatitis 08/08/2014  . History of kidney stones   . HTN (hypertension)   . Hyperlipidemia   . Interstitial lung disease ([place])   . Left arm pain    s/p fall  . Nocturia   . Numbness of right foot    due to DDD lumbar  . Peripheral edema   . Pilonidal cyst   . Pleural effusion on right   . Pulmonary fibrosis ([place])    followed by pcp-- last chest CT 04-08-2016  . S/P drug eluting coronary stent placement 10/11/2005   mLAD and dRCA  . Wears dentures    upper  . Wears glasses     BP (!) 163/66    Pulse 76   Ht 6' (1.829 m)   Wt 250 lb (113.4 kg)   BMI 33.91 kg/m   He is awake alert and oriented x3 his mood and affect are normal he is very pleasant he has a cane which he uses for ambulation he does not have full extension of the knee but this is chronic he can flex it past 110 degrees it feels stable in all planes he has no tenderness to palpation no swelling skin looks good has some peripheral edema  X-rays show some osteoarthritis some bone spurs some chondrocalcinosis but nothing acute  Suspect small slight sprain of the joint could have a meniscal tear yes does not seem to be symptomatic recommend support economy hinged brace  Follow-up as needed  Encounter Diagnoses  Name Primary?  . Chronic pain of left knee Yes  . Primary localized osteoarthritis of knee - left knee   . Chondrocalcinosis

## 2020-06-02 NOTE — Telephone Encounter (Signed)
All notes were pulled out of file by back office staff I was told. Should be in back office.

## 2020-06-02 NOTE — Patient Instructions (Signed)
I SEE SOME ARTHRITIS AND A FEW BONE SPURS   THE KNEE FEELS GOOD   I D WEAR A KNEE SUPPORT BUT OTHER THAN THAT EVERYTHING LOOKS OK

## 2020-06-16 DIAGNOSIS — Z6834 Body mass index (BMI) 34.0-34.9, adult: Secondary | ICD-10-CM | POA: Diagnosis not present

## 2020-06-16 DIAGNOSIS — G894 Chronic pain syndrome: Secondary | ICD-10-CM | POA: Diagnosis not present

## 2020-06-16 DIAGNOSIS — M48061 Spinal stenosis, lumbar region without neurogenic claudication: Secondary | ICD-10-CM | POA: Diagnosis not present

## 2020-06-16 DIAGNOSIS — M1A00X0 Idiopathic chronic gout, unspecified site, without tophus (tophi): Secondary | ICD-10-CM | POA: Diagnosis not present

## 2020-06-16 DIAGNOSIS — M5137 Other intervertebral disc degeneration, lumbosacral region: Secondary | ICD-10-CM | POA: Diagnosis not present

## 2020-06-20 DIAGNOSIS — E7849 Other hyperlipidemia: Secondary | ICD-10-CM | POA: Diagnosis not present

## 2020-06-20 DIAGNOSIS — N1831 Chronic kidney disease, stage 3a: Secondary | ICD-10-CM | POA: Diagnosis not present

## 2020-06-20 DIAGNOSIS — I2510 Atherosclerotic heart disease of native coronary artery without angina pectoris: Secondary | ICD-10-CM | POA: Diagnosis not present

## 2020-06-20 DIAGNOSIS — I129 Hypertensive chronic kidney disease with stage 1 through stage 4 chronic kidney disease, or unspecified chronic kidney disease: Secondary | ICD-10-CM | POA: Diagnosis not present

## 2020-07-17 DIAGNOSIS — M5137 Other intervertebral disc degeneration, lumbosacral region: Secondary | ICD-10-CM | POA: Diagnosis not present

## 2020-07-17 DIAGNOSIS — Z6834 Body mass index (BMI) 34.0-34.9, adult: Secondary | ICD-10-CM | POA: Diagnosis not present

## 2020-07-17 DIAGNOSIS — G894 Chronic pain syndrome: Secondary | ICD-10-CM | POA: Diagnosis not present

## 2020-07-17 DIAGNOSIS — M1991 Primary osteoarthritis, unspecified site: Secondary | ICD-10-CM | POA: Diagnosis not present

## 2020-07-19 DIAGNOSIS — I129 Hypertensive chronic kidney disease with stage 1 through stage 4 chronic kidney disease, or unspecified chronic kidney disease: Secondary | ICD-10-CM | POA: Diagnosis not present

## 2020-07-19 DIAGNOSIS — N1831 Chronic kidney disease, stage 3a: Secondary | ICD-10-CM | POA: Diagnosis not present

## 2020-07-19 DIAGNOSIS — E7849 Other hyperlipidemia: Secondary | ICD-10-CM | POA: Diagnosis not present

## 2020-07-19 DIAGNOSIS — I2510 Atherosclerotic heart disease of native coronary artery without angina pectoris: Secondary | ICD-10-CM | POA: Diagnosis not present

## 2020-07-23 ENCOUNTER — Other Ambulatory Visit: Payer: Self-pay | Admitting: Pulmonary Disease

## 2020-08-08 ENCOUNTER — Telehealth: Payer: Self-pay | Admitting: Pulmonary Disease

## 2020-08-08 NOTE — Telephone Encounter (Signed)
Called and spoke with pt and he stated that the pharmacy that he gets the esbriet from called him about his medications.  He gave them information over the phone and they advised him that they would fax over the information to PM.  Will forward to LT to follow up on.

## 2020-08-15 NOTE — Telephone Encounter (Signed)
I have not seen anything for this Patient.  Not sure if someone else worked with Dr. Vaughan Browner may have given him anything, or if Pharmacy team received anything, since it is Esbriet.

## 2020-08-18 DIAGNOSIS — G894 Chronic pain syndrome: Secondary | ICD-10-CM | POA: Diagnosis not present

## 2020-08-18 DIAGNOSIS — M1991 Primary osteoarthritis, unspecified site: Secondary | ICD-10-CM | POA: Diagnosis not present

## 2020-08-18 DIAGNOSIS — M48061 Spinal stenosis, lumbar region without neurogenic claudication: Secondary | ICD-10-CM | POA: Diagnosis not present

## 2020-08-18 DIAGNOSIS — Z6834 Body mass index (BMI) 34.0-34.9, adult: Secondary | ICD-10-CM | POA: Diagnosis not present

## 2020-08-18 DIAGNOSIS — M5137 Other intervertebral disc degeneration, lumbosacral region: Secondary | ICD-10-CM | POA: Diagnosis not present

## 2020-08-19 ENCOUNTER — Other Ambulatory Visit: Payer: Self-pay

## 2020-08-19 ENCOUNTER — Ambulatory Visit (INDEPENDENT_AMBULATORY_CARE_PROVIDER_SITE_OTHER): Payer: Medicare Other | Admitting: Cardiovascular Disease

## 2020-08-19 ENCOUNTER — Encounter: Payer: Self-pay | Admitting: Cardiovascular Disease

## 2020-08-19 DIAGNOSIS — I25119 Atherosclerotic heart disease of native coronary artery with unspecified angina pectoris: Secondary | ICD-10-CM

## 2020-08-19 DIAGNOSIS — R600 Localized edema: Secondary | ICD-10-CM

## 2020-08-19 DIAGNOSIS — E785 Hyperlipidemia, unspecified: Secondary | ICD-10-CM | POA: Diagnosis not present

## 2020-08-19 DIAGNOSIS — J8410 Pulmonary fibrosis, unspecified: Secondary | ICD-10-CM

## 2020-08-19 DIAGNOSIS — I10 Essential (primary) hypertension: Secondary | ICD-10-CM | POA: Diagnosis not present

## 2020-08-19 MED ORDER — METOPROLOL TARTRATE 50 MG PO TABS: ORAL_TABLET | ORAL | 2 refills | Status: DC

## 2020-08-19 NOTE — Progress Notes (Signed)
Patient ID: Maurice Munoz, male   DOB: [birth year], 75 y.o.   MRN: [id number]      HPI: Maurice Munoz, is a 75 y.o. male who presents to the office today for a 6 month cardiology followup evaluation.     [name] has established CAD dating back to 1999 when he underwent intervention to his RCA and PDA. In April 2007 he underwent stenting to his LAD and distal RCA at which time Cypher DES stents were inserted. He has a history of hypertension, obesity, hyperlipidemia, gout, and  lower extremity edema.   A two-year followup nuclear perfusion study in March 2014 was essentially unchanged from his prior study and continued to show fairly normal perfusion and was low risk, without evidence for scar. There was no significant ischemia.  In 2015 he had an injury to his right leg.  He had noted some intermittent leg swelling.  He had carotids Doppler study in January 2016 which showed 50-60% left proximal ICA diameter reduction, which had slightly increased from his prior study one year previously.  Because of a history of coughing up blood with pleuritic chest pain he underwent a CT [place] of his chest on March 21 which was negative for PE but showed diffuse peripheral lung densities throughout both upper and lower zones without honeycombing raising the Maurice Munoz concern for mild fibrotic changes but the pattern was nonspecific.    Over the last several years he has lost over 40 pounds from his peak weight.  He  was seen in the emergency room with lightheadedness and was hypotensive.  He was told to discontinue his isosorbide as well as lisinopril.   In the setting of his hypotension he was found to have an increased creatinine at 1.4 recently had been 1.1. He has been followed by Maurice Munoz in [place] for his renal issues.  Over the past year, he has experienced occasional episodes of chest pain which he describes as tightness.  Typically, this can occur with exertion.  At times he also notices a  chest burning.  He admits to shortness of breath.  He has been evaluated by Maurice Munoz for pulmonary fibrosis.  He has an extensive occupational history infrequently had worked in Lobbyist, Engineer, materials, and chrome plating for many years and was often exposed to significant dust inhalation.  He also had worked in Industrial/product designer.  There is remote tobacco history of 2-3 packs per day for 20-25 years, but unfortunately he quit in 1980s.  He is felt most likely to have interstitial lung disease and continues further evaluation.  He has had difficulty with his low back.  He has been on lisinopril 5 mg, metoprolol 50 mg twice a day.  He continues to take simvastatin 20 mg for hyperlipidemia.  He is been maintained on aspirin and Plavix.  There is remote history of gout, which is controlled with allopurinol.  When I saw him on February 2019, he had noticed episodes of chest burning as well as chest tightness and continues to experience exertional dyspnea.  I initiated amlodipine at 5 mg.  I strongly recommended support stockings for his potential edema.  He was given a prescription for sublabel nitroglycerin.  He underwent a follow-up [place] study of 08/12/2017 which was low risk and without ischemia.  EF was 57%.  There was minimal apical thinning most likely contributed by his body habitus.  He underwent an echo Doppler study on 10/14/2017 which showed an EF of 60-65% and grade 2  diastolic dysfunction.  There was moderate pulmonary hypertension with PA pressure at 46 mm.    At evaluation in March 2019 he was feeling better.  He denied recurrent chest pain or shortness of breath.  He is lost 21 pounds in 3 weeks purposefully.   He developed recurrent chest pain symptomatology and developed unstable angina symptoms leading to hospitalization with a non-ST segment elevation MI.  On April 12, 2018 he underwent diagnostic catheterization by Dr. Irish Munoz due to multivessel CAD he underwent surgical consultation for  consideration of CABG surgery but was turned down.  As result, on April 14, 2018, I performed successful multi lesion/multivessel PCI to the RCA and LAD vessels.  He underwent Cutting Balloon PTCA to the mid PDA vessel of the RCA with a 95% stenosis being reduced to 15% and Cutting Balloon PTCA DES stenting with a 2.5 x12 mm Resolute stent inserted into the distal RCA immediately proximal to the takeoff of the PDA vessel with stent overlap to the previously placed stent with a 99% stenosis reduced to 0%.  He also underwent 3 lesion intervention to the LAD with PTCA of the 90% apical LAD stenosis, PTCA DES stenting at 2 additional study sites in the LAD successfully.  He still had residual concomitant 80% AV groove circumflex and circumflex marginal disease which was treated medically.  Maurice Munoz has noted significant improvement following his multilesion multivessel intervention to his RCA and LAD.  He has been seen in the office since by Maurice Sims, NP as well as Maurice Deforest, PA.  When last seen by Maurice Munoz, he reduced his amlodipine since his blood pressure was low and he also had 2+ pitting edema for which he recommended restarting his previous torsemide daily.  I  saw him in November 2020.  He underwent an echo Doppler study 24 2020 which showed normal systolic with EF at 60 to 65%, mild LVH diastolic parameters were indeterminate.  He had moderately elevated pulmonary artery systolic pressure at 42 mm.    He was  evaluated by me on Nov 16, 2019.  He had done well since his last evaluation but over the past 2 weeks has again begun to notice some recurrent episodes of chest pain and shortness of breath.  He also has been diagnosed with pulmonary fibrosis.  He continues to experience mild leg swelling.  During that evaluation I recommended initiating isosorbide mononitrate 30 mg.  He had been on low-dose amlodipine at 2.5 mg and I recommended further titration of 5 mg.  He continues to be on metoprolol  tartrate 25 mg twice a day and remained on DAPT with aspirin/Plavix.   He was to follow-up with Maurice Munoz for his pulmonary fibrosis.  He has been started on Esbriet for his pulmonary fibrosis.  He has noticed leg swelling bilaterally left greater than right.  Approximately 2 weeks ago he took sublingual nitroglycerin with improvement of his mild chest pain.  During that evaluation, he was on amlodipine 5 mg and I recommended further titration of isosorbide to 60 mg daily and that he continue metoprolol tartrate 50 mg twice a day.  He had 2+ bilateral leg swelling and I recommended torsemide 20 mg daily and support stockings.  I last saw him in September 2021 and over the prior several months he had remained fairly stable and denied recurrent chest pain on his increased medical regimen.  He continues to be on supplemental oxygen for his pulmonary fibrosis.  He was continuing to experience some  leg swelling right greater than left but had not been using support stockings.  During that evaluation I again recommended he use support stockings.  Since I last saw him, he has continued to experience leg swelling and has been taking torsemide 2 times per week.  He has noticed an increased heart rate with activity.  He has been on amlodipine 5 mg, isosorbide 60 mg, metoprolol 50 mg twice a day in addition to torsemide.  He continues to be on DAPT with aspirin/Plavix.  He is tolerating atorvastatin 80 mg.  Laboratory was checked at [facility] in July 2021 and lipids were excellent.  As recently, he has been sleeping in a recliner.  He has not had recent laboratory.  He presents for evaluation.  Past Medical History:  Diagnosis Date  . Adrenal adenoma, left    small  . Aortic atherosclerosis ([place])   . Back pain   . Bilateral carotid artery stenosis followed by dr Claiborne Munoz   per last carotid duplex [id number]  -- proximal LICA 22-29% ,  RICA 7-98%  . CAD (coronary artery disease)    a. s/p prior  intervention to RCA and PDA in 1999 b. DES to LAD and distal RCA in 2007  . Cardiomegaly    Stable  . Chronic constipation   . Chronic pain syndrome   . CKD (chronic kidney disease), stage III ([place])   . DDD (degenerative disc disease), cervical   . DDD (degenerative disc disease), lumbosacral   . Dyspnea   . Dyspnea on exertion   . Gout    per pt last inflared episode 2015 approx.  . Grade II diastolic dysfunction   . H/O epistaxis    per pt sees ENT dr Benjamine Mola for cauterization (pt takes plavix)  . History of acute pancreatitis 08/08/2014  . History of kidney stones   . HTN (hypertension)   . Hyperlipidemia   . Interstitial lung disease ([place])   . Left arm pain    s/p fall  . Nocturia   . Numbness of right foot    due to DDD lumbar  . Peripheral edema   . Pilonidal cyst   . Pleural effusion on right   . Pulmonary fibrosis ([place])    followed by pcp-- last chest CT 04-08-2016  . S/P drug eluting coronary stent placement 10/11/2005   mLAD and dRCA  . Wears dentures    upper  . Wears glasses     Past Surgical History:  Procedure Laterality Date  . ANTERIOR CERVICAL DECOMP/DISCECTOMY FUSION  12-25-2001     dr Orinda Kenner [facility]   C3 -- C7  . ANTERIOR CERVICAL DECOMP/DISCECTOMY FUSION  12-11-2012   dr Carloyn Manner  [facility])  . CARDIOVASCULAR STRESS TEST  08-23-2012    dr Claiborne Munoz   normal lexiscan nuclear study w/ no ischemia/  normal LV function and wall motion, ef 65%  . CARDIOVASCULAR STRESS TEST    . CORONARY ANGIOPLASTY  1999   PTCA to RCA & PDA  . CORONARY ANGIOPLASTY WITH STENT PLACEMENT  10-11-2005  dr Shelva Majestic   PCI and stenting to [place] (Cypher DES x2)  . CORONARY BALLOON ANGIOPLASTY N/A 04/14/2018   Procedure: CORONARY BALLOON ANGIOPLASTY;  Surgeon: Maurice Sine, Maurice Munoz;  Location: [place] [facility];  Service: Cardiovascular;  Laterality: N/A;  . CORONARY STENT INTERVENTION N/A 04/14/2018   Procedure: CORONARY STENT INTERVENTION;  Surgeon: Maurice Sine,  Maurice Munoz;  Location: [place] [facility];  Service: Cardiovascular;  Laterality: N/A;  . CYSTOSCOPY W/ URETERAL STENT PLACEMENT Left 02-11-2010     [facility]  . LEFT HEART CATH N/A 04/14/2018   Procedure: Left Heart Cath;  Surgeon: Maurice Sine, Maurice Munoz;  Location: [place] [facility];  Service: Cardiovascular;  Laterality: N/A;  . LEFT HEART CATH AND CORONARY ANGIOGRAPHY N/A 04/12/2018   Procedure: LEFT HEART CATH AND CORONARY ANGIOGRAPHY;  Surgeon: Maurice Booze, Maurice Munoz;  Location: [place] [facility];  Service: Cardiovascular;  Laterality: N/A;  . LUMBAR LAMINECTOMY  2015   L5 -- S1  . PILONIDAL CYST EXCISION  1980s  . PILONIDAL CYST EXCISION N/A 03/31/2017   Procedure: EXCISION OF PILONIDAL DISEASE with Flap Rotation;  Surgeon: Leighton Ruff, Maurice Munoz;  Location: [place];  Service: General;  Laterality: N/A;  . THROAT SURGERY  1996   per pt "removal piece of cryloid(?) that was pressing against throat"  states no issues since  . TRANSTHORACIC ECHOCARDIOGRAM  08-24-2010  dr Claiborne Munoz   ef 50-55%/  mild MR/  trivial TR  . WOUND DEBRIDEMENT N/A 10/06/2017   Procedure: DEBRIDEMENT WOUND PLACEMENT OF WOUND HEALING MATRIX;  Surgeon: Leighton Ruff, Maurice Munoz;  Location: [place];  Service: General;  Laterality: N/A;    Allergies  Allergen Reactions  . Ibuprofen Itching    Motrin brand  . Neosporin [Neomycin-Bacitracin Zn-Polymyx] Swelling  . Penicillins Rash     Has patient had a PCN reaction causing immediate rash, facial/tongue/throat swelling, SOB or lightheadedness with hypotension: No Has patient had a PCN reaction causing severe rash involving mucus membranes or skin necrosis: No Has patient had a PCN reaction that required hospitalization: No Has patient had a PCN reaction occurring within the last 10 years: No If all of the above answers are "NO", then may proceed with Cephalosporin use.     Current Outpatient Medications  Medication Sig Dispense Refill  . allopurinol  (ZYLOPRIM) 300 MG tablet Take 300 mg by mouth every morning.     Marland Kitchen amLODipine (NORVASC) 5 MG tablet TAKE ONE TABLET (5MG TOTAL) BY MOUTH DAILY 90 tablet 3  . aspirin EC 81 MG tablet Take 81 mg by mouth daily.    Marland Kitchen atorvastatin (LIPITOR) 80 MG tablet TAKE ONE TABLET (80MG TOTAL) BY MOUTH AT6PM 90 tablet 3  . b complex vitamins tablet Take 1 tablet by mouth daily.    . benzonatate (TESSALON) 200 MG capsule TAKE ONE CAPSULE (200MG TOTAL) BY MOUTH TWO TIMES DAILY AS NEEDED FOR COUGH 60 capsule 2  . cholecalciferol (VITAMIN D) 1000 units tablet Take 5,000 Units by mouth every morning.     . clopidogrel (PLAVIX) 75 MG tablet TAKE ONE (1) TABLET BY MOUTH EVERY DAY 90 tablet 2  . isosorbide mononitrate (IMDUR) 60 MG 24 hr tablet Take 1 tablet (60 mg total) by mouth daily. 90 tablet 3  . ondansetron (ZOFRAN) 4 MG tablet Take 1 tablet (4 mg total) by mouth every 8 (eight) hours as needed for nausea or vomiting. 90 tablet 5  . oxyCODONE-acetaminophen (PERCOCET) 10-325 MG per tablet Take 1 tablet by mouth every 8 (eight) hours as needed for pain.     . Pirfenidone (ESBRIET) 801 MG TABS Take 1 tablet by mouth 3 (three) times daily with meals. 90 tablet 5  . tiZANidine (ZANAFLEX) 4 MG tablet Take 4 mg by mouth at bedtime.     . torsemide (DEMADEX) 20 MG tablet Take 1 tablet (20 mg total) by mouth daily. IF ADDITIONAL SWELLING, MAY TAKE  AN ADDITIONAL DOSE IN THE AFTERNOON 90 tablet 3  . metoprolol tartrate (LOPRESSOR) 50 MG tablet Take 1.5 tablet (75 mg) in the AM, and 1 tablet (50 mg) in the PM. 180 tablet 2  . nitroGLYCERIN (NITROSTAT) 0.4 MG SL tablet Place 1 tablet (0.4 mg total) under the tongue every 5 (five) minutes as needed for chest pain. 25 tablet 3   No current facility-administered medications for this visit.    Socially he is married. There are no children. Has a remote tobacco history having quit approximately 16 years ago. He's not very active. He does not routinely exercise.  ROS General:  Negative; No fevers, chills, or night sweats;  HEENT: Negative; No changes in vision or hearing, sinus congestion, difficulty swallowing Pulmonary: Positive for pulmonary fibrosis Cardiovascular: See history of present illness GI: Negative; No nausea, vomiting, diarrhea, or abdominal pain GU: Negative; No dysuria, hematuria, or difficulty voiding Musculoskeletal: Negative; no myalgias, joint pain, or weakness Hematologic/Oncology: Negative; no easy bruising, bleeding Endocrine: Negative; no heat/cold intolerance; no diabetes Neuro: Negative; no changes in balance, headaches Skin: Negative; No rashes or skin lesions Psychiatric: Negative; No behavioral problems, depression Sleep: Negative; No snoring, daytime sleepiness, hypersomnolence, bruxism, restless legs, hypnogognic hallucinations, no cataplexy Other comprehensive 14 point system review is negative.   PE BP 126/80   Pulse 67   Ht 6' (1.829 m)   Wt 249 lb (112.9 kg)   BMI 33.77 kg/m    Repeat blood pressure by me was 130/80  Wt Readings from Last 3 Encounters:  08/19/20 249 lb (112.9 kg)  06/02/20 250 lb (113.4 kg)  03/18/20 257 lb (116.6 kg)   General: Alert, oriented, no distress.  Skin: normal turgor, no rashes, warm and dry HEENT: Normocephalic, atraumatic. Pupils equal round and reactive to light; sclera anicteric; extraocular muscles intact;  Nose without nasal septal hypertrophy Mouth/Parynx benign; Mallinpatti scale 3 Neck: No JVD, no carotid bruits; normal carotid upstroke Lungs: clear to ausculatation and percussion; no wheezing or rales Chest wall: without tenderness to palpitation Heart: PMI not displaced, RRR, s1 s2 normal, 1/6 systolic murmur, no diastolic murmur, no rubs, gallops, thrills, or heaves Abdomen: soft, nontender; no hepatosplenomehaly, BS+; abdominal aorta nontender and not dilated by palpation. Back: no CVA tenderness Pulses 2+ Musculoskeletal: full range of motion, normal strength, no joint  deformities Extremities: 2+ edema bilaterally right greater than left; no clubbing cyanosis,, Homan's sign negative  Neurologic: grossly nonfocal; Cranial nerves grossly wnl Psychologic: Normal mood and affect   ECG (independently read by me): Sinus rhythm at 67, sinus arrhythmia with low atrial atrial ectopy    September 2021 ECG (independently read by me): NSR 62; no STT changes; normal intervals  May 2021 ECG (independently read by me): NSR at 66; no ST changes; normal intervals  November 2020 ECG (independently read by me): Normal sinus rhythm at 75 bpm.  No ectopy.  Normal intervals  March 2019 ECG (independently read by me): Bradycardia 57 bpm.  No ectopy.  Normal intervals.  February 2018 ECG (independently read by me): Sinus bradycardia 57 bpm.  No ectopy.  Normal intervals.  January 2018 ECG (independently read by me): Sinus rhythm at 60 bpm.  No ectopy.  Normal intervals.  June 2017 ECG (independently read by me): Normal sinus rhythm at 61 bpm.  No ectopy.  Normal intervals.  April 2016 ECG (independently read by me): Normal sinus rhythm at 67 bpm.  No ectopy.  Normal intervals.  October 2014 ECG: Normal sinus rhythm at  63 beats per minute. No significant ST abnormalities. Normal intervals.  LABS:  BMP Latest Ref Rng & Units 08/25/2020 03/18/2020 12/17/2019  Glucose 65 - 99 mg/dL 94 90 113(H)  BUN 8 - 27 mg/dL 15 15 28(H)  Creatinine 0.76 - 1.27 mg/dL 1.32(H) 1.40 1.43  BUN/Creat Ratio 10 - 24 11 - -  Sodium 134 - 144 mmol/L 143 138 140  Potassium 3.5 - 5.2 mmol/L 4.4 4.2 4.2  Chloride 96 - 106 mmol/L 101 102 101  CO2 20 - 29 mmol/L 23 34(H) 30  Calcium 8.6 - 10.2 mg/dL 9.9 9.5 9.9    Hepatic Function Latest Ref Rng & Units 08/25/2020 03/18/2020 12/17/2019  Total Protein 6.0 - 8.5 g/dL 7.2 7.1 7.7  Albumin 3.7 - 4.7 g/dL 4.0 4.1 4.6  AST 0 - 40 IU/L _0 ALT 0 - 44 IU/L _1 Alk Phosphatase 44 - 121 IU/L 117 98 113  Total Bilirubin 0.0 - 1.2 mg/dL 0.8 1.0 0.8   Bilirubin, Direct 0.0 - 0.3 mg/dL - - -   CBC Latest Ref Rng & Units 08/25/2020 05/01/2019 05/16/2018  WBC 3.4 - 10.8 x10E3/uL 8.9 11.3(H) 8.1  Hemoglobin 13.0 - 17.7 g/dL 17.2 14.8 13.5  Hematocrit 37.5 - 51.0 % 51.7(H) 44.4 42.4  Platelets 150 - 450 x10E3/uL 255 221.0 215   Lab Results  Component Value Date   MCV 88 08/25/2020   MCV 88.4 05/01/2019   MCV 91.0 05/16/2018    Lab Results  Component Value Date   TSH 1.040 08/25/2020   Lipid Panel     Component Value Date/Time   CHOL 119 08/25/2020 0754   TRIG 142 08/25/2020 0754   HDL 44 08/25/2020 0754   CHOLHDL 2.7 08/25/2020 0754   CHOLHDL 4.4 04/13/2018 0513   VLDL 27 04/13/2018 0513   LDLCALC 50 08/25/2020 0754     RADIOLOGY: No results found.   Other studies Reviewed: [facility] 04/14/2018: Conclusion     Mid RCA lesion is 65% stenosed.  Previously placed Dist RCA-1 stent (unknown type) is widely patent.  Dist RCA-2 lesion is 99% stenosed.  Ost RPDA to RPDA lesion is 95% stenosed.  Ost 1st Mrg to 1st Mrg lesion is 80% stenosed.  Prox Cx lesion is 80% stenosed.  Prox LAD to Mid LAD lesion is 10% stenosed.  Mid LAD to Dist LAD lesion is 80% stenosed.  Dist LAD lesion is 90% stenosed.  Post intervention, there is a 20% residual stenosis.  Post intervention, there is a 15% residual stenosis.  Post intervention, there is a 0% residual stenosis.  A stent was successfully placed.  Post intervention, there is a 0% residual stenosis.  Post intervention, there is a 0% residual stenosis.  Mid LAD lesion is 80% stenosed.  A stent was successfully placed.  Post intervention, there is a 0% residual stenosis.  Post intervention, there is a 0% residual stenosis.  A stent was successfully placed.  Successful multilesion/multivessel PCI to the RCA and LAD vessels.  Cutting Balloon/PTCA to the mid PDA vessel of the RCA with a 95% stenosis being reduced to 15%, and Cutting Balloon/PTCA/DES stenting  with a 2.5 x 12 mm Resolute Onyx DES stent inserted in the distal RCA immediately proximal to the takeoff of the PDA vessel with stent overlap to the previously placed stent and postdilatation up to 2.75 mm with the 99% stenosis being reduced to 0%.  Three lesion intervention to the LAD with PTCA of the apical 90%  LAD stenosis reduced to less than 30%, PTCA/DES stenting with a 3.0 x 22 mm Resolute Onyx DES stent postdilated to 3.25 mm with the 80% stenosis being reduced to 0% in the 80% stenosis just distal to the old LAD stent treated with PTCA/DES stenting with a 3.0 x 18 mm Resolute Onyx DES stent postdilated 3.3 mm with the 80% stenosis being reduced to 0%.  LVEDP preintervention was 15 mmHg.  RECOMMENDATION: The patient will be being maintained on dual antiplatelet therapy probably indefinitely due to significant multi-vessel disease and intervention. Increase medical therapy for concomitant CAD. Patient will be hydrated post procedure. Consider discharge over the weekend on increased medical therapy. If recurrent symptoms develop consider staged PCI to his circumflex marginal and AV groove circumflex vessels.  __________  LHC 04/12/2018: Conclusion     The left ventricular ejection fraction is 50-55% by visual estimate.  The left ventricular systolic function is normal.  LV end diastolic pressure is mildly elevated. LVEDP 16 mm Hg.  There is no aortic valve stenosis.  Previously placed Dist RCA-1 stent (unknown type) is widely patent.  Dist RCA-2 lesion is 95% stenosed.  RPDA lesion is 80% stenosed.  Prox Cx lesion is 80% stenosed.  Ost 1st Mrg to 1st Mrg lesion is 80% stenosed.  Previously placed Mid LAD-1 stent (unknown type) is widely patent.  Mid LAD-2 lesion is 75% stenosed.  Ost 2nd Diag to 2nd Diag lesion is 75% stenosed.  Dist LAD lesion is 75% stenosed.  Ost 1st Diag lesion is 50% stenosed.  Prox LAD lesion is 25% stenosed.  Multivessel CAD.  Plan for cardiac surgery consult. He does have diffuse distal LAD disease which is not optimal for LIMA to LAD. If he is not a candidate for CABG, would plan to bring back for PCI.      IMPRESSION:  1. Coronary artery disease involving native coronary artery of native heart with angina pectoris ([place])   2. Essential hypertension   3. Bilateral lower extremity edema   4. Hyperlipidemia LDL goal <70   5. Pulmonary fibrosis ([place])      ASSESSMENT AND PLAN: Mr. Mickelson is a 75 year old Caucasian male who is 23 years since his initial intervention to his RCA and PDA and 15 years since stenting to his LAD and distal RCA.  He has a history of hypertension.   Due to his significant occupational exposure, remote tobacco, and abnormal PFTs on CT imaging he is felt most likely to have interstitial lung disease. He developed recurrent unstable anginal symptomatology and was found to have progressive multivessel CAD.  He was turned down for CABG revascularization.  He underwent complex multi lesion multivessel PCI by me on April 14, 2018 at several sites in his RCA and LAD.  At the time he also had concomitant circumflex and circumflex and marginal disease which was not intervened upon.  He had felt significantly improved with reference to symptoms but when evaluated in November 2020 he had recurrent chest pain and increased shortness of breath.  He had been started on Esbriet for pulmonary fibrosis.  I further titrated his anti-ischemic medication with improvement in his symptomatology.  Presently, he denies any recurrent anginal symptoms.  He notes his heart rate speeding up fairly quickly.  I have recommended slight titration of metoprolol tartrate from 50 mg twice a day to 75 mg in the morning and 50 mg at night.  He does have 2+ lower extremity edema.  I have suggested he take torsemide for  the next several days and perhaps may need to take this every other day or daily on an as-needed basis depending  upon edema.  He has not had recent laboratory.  I am checking a comprehensive metabolic panel TSH, CBC and lipid studies.  He continues to be on Esbriet for his interstitial fibrosis followed by Maurice Munoz.  Most recently he has been sleeping in a recliner and is on oxygen supplementation.   Maurice Sine, Maurice Munoz, [facility]  08/26/2020 3:13 PM

## 2020-08-19 NOTE — Patient Instructions (Signed)
Medication Instructions:  Increase Metoprolol to 1.5 tablet (75 mg) in the AM, and 1 tablet (50 mg) in the PM Consider taking Torsemide twice a week based on your swelling.   *If you need a refill on your cardiac medications before your next appointment, please call your pharmacy*   Lab Work: CBC, CMET, TSH, LIPID (you may have this done in [place])  If you have labs (blood work) drawn today and your tests are completely normal, you will receive your results only by: Marland Kitchen MyChart Message (if you have MyChart) OR . A paper copy in the mail If you have any lab test that is abnormal or we need to change your treatment, we will call you to review the results.   Follow-Up: At [facility], you and your health needs are our priority.  As part of our continuing mission to provide you with exceptional heart care, we have created designated Provider Care Teams.  These Care Teams include your primary Cardiologist (physician) and Advanced Practice Providers (APPs -  Physician Assistants and Nurse Practitioners) who all work together to provide you with the care you need, when you need it.  We recommend signing up for the patient portal called "MyChart".  Sign up information is provided on this After Visit Summary.  MyChart is used to connect with patients for Virtual Visits (Telemedicine).  Patients are able to view lab/test results, encounter notes, upcoming appointments, etc.  Non-urgent messages can be sent to your provider as well.   To learn more about what you can do with MyChart, go to [email or web address].    Your next appointment:   6 month(s)  The format for your next appointment:   In Person  Provider:   Shelva Majestic, MD

## 2020-08-22 NOTE — Telephone Encounter (Signed)
?   Did he every get his med? LMTCB for the pt to see if anything further needed

## 2020-08-25 DIAGNOSIS — I10 Essential (primary) hypertension: Secondary | ICD-10-CM | POA: Diagnosis not present

## 2020-08-25 DIAGNOSIS — E785 Hyperlipidemia, unspecified: Secondary | ICD-10-CM | POA: Diagnosis not present

## 2020-08-25 NOTE — Telephone Encounter (Signed)
ATC Patient. Left detailed message on VM to call office with any issues with Esbriet prescription.

## 2020-08-26 ENCOUNTER — Encounter: Payer: Self-pay | Admitting: Cardiovascular Disease

## 2020-08-26 LAB — COMPREHENSIVE METABOLIC PANEL
ALT: 17 IU/L (ref 0–44)
AST: 24 IU/L (ref 0–40)
Albumin/Globulin Ratio: 1.3 (ref 1.2–2.2)
Albumin: 4 g/dL (ref 3.7–4.7)
Alkaline Phosphatase: 117 IU/L (ref 44–121)
BUN/Creatinine Ratio: 11 (ref 10–24)
BUN: 15 mg/dL (ref 8–27)
Bilirubin Total: 0.8 mg/dL (ref 0.0–1.2)
CO2: 23 mmol/L (ref 20–29)
Calcium: 9.9 mg/dL (ref 8.6–10.2)
Chloride: 101 mmol/L (ref 96–106)
Creatinine, Ser: 1.32 mg/dL — ABNORMAL HIGH (ref 0.76–1.27)
Globulin, Total: 3.2 g/dL (ref 1.5–4.5)
Glucose: 94 mg/dL (ref 65–99)
Potassium: 4.4 mmol/L (ref 3.5–5.2)
Sodium: 143 mmol/L (ref 134–144)
Total Protein: 7.2 g/dL (ref 6.0–8.5)
eGFR: 57 mL/min/{1.73_m2} — ABNORMAL LOW (ref >59)

## 2020-08-26 LAB — LIPID PANEL
Chol/HDL Ratio: 2.7 ratio (ref 0.0–5.0)
Cholesterol, Total: 119 mg/dL (ref 100–199)
HDL: 44 mg/dL (ref >39)
LDL Chol Calc (NIH): 50 mg/dL (ref 0–99)
Triglycerides: 142 mg/dL (ref 0–149)
VLDL Cholesterol Cal: 25 mg/dL (ref 5–40)

## 2020-08-26 LAB — CBC
Hematocrit: 51.7 % — ABNORMAL HIGH (ref 37.5–51.0)
Hemoglobin: 17.2 g/dL (ref 13.0–17.7)
MCH: 29.3 pg (ref 26.6–33.0)
MCHC: 33.3 g/dL (ref 31.5–35.7)
MCV: 88 fL (ref 79–97)
Platelets: 255 10*3/uL (ref 150–450)
RBC: 5.87 x10E6/uL — ABNORMAL HIGH (ref 4.14–5.80)
RDW: 14.1 % (ref 11.6–15.4)
WBC: 8.9 10*3/uL (ref 3.4–10.8)

## 2020-08-26 LAB — TSH: TSH: 1.04 u[IU]/mL (ref 0.450–4.500)

## 2020-09-15 ENCOUNTER — Other Ambulatory Visit (HOSPITAL_COMMUNITY): Payer: Self-pay | Admitting: Physician Assistant

## 2020-09-15 DIAGNOSIS — M1A00X0 Idiopathic chronic gout, unspecified site, without tophus (tophi): Secondary | ICD-10-CM | POA: Diagnosis not present

## 2020-09-15 DIAGNOSIS — M48061 Spinal stenosis, lumbar region without neurogenic claudication: Secondary | ICD-10-CM | POA: Diagnosis not present

## 2020-09-15 DIAGNOSIS — G894 Chronic pain syndrome: Secondary | ICD-10-CM | POA: Diagnosis not present

## 2020-09-15 DIAGNOSIS — Z6834 Body mass index (BMI) 34.0-34.9, adult: Secondary | ICD-10-CM | POA: Diagnosis not present

## 2020-09-15 DIAGNOSIS — M5137 Other intervertebral disc degeneration, lumbosacral region: Secondary | ICD-10-CM | POA: Diagnosis not present

## 2020-09-16 ENCOUNTER — Other Ambulatory Visit (HOSPITAL_COMMUNITY)
Admission: RE | Admit: 2020-09-16 | Discharge: 2020-09-16 | Disposition: A | Discharge status: Home / Self Care | Payer: Medicare Other | Source: Ambulatory Visit | Attending: Pulmonary Disease | Admitting: Pulmonary Disease

## 2020-09-16 DIAGNOSIS — Z01812 Encounter for preprocedural laboratory examination: Secondary | ICD-10-CM | POA: Diagnosis not present

## 2020-09-16 DIAGNOSIS — Z20822 Contact with and (suspected) exposure to covid-19: Secondary | ICD-10-CM | POA: Insufficient documentation

## 2020-09-16 LAB — SARS CORONAVIRUS 2 (TAT 6-24 HRS): SARS Coronavirus 2: NEGATIVE

## 2020-09-17 ENCOUNTER — Telehealth: Payer: Self-pay | Admitting: Pulmonary Disease

## 2020-09-17 DIAGNOSIS — N1831 Chronic kidney disease, stage 3a: Secondary | ICD-10-CM | POA: Diagnosis not present

## 2020-09-17 DIAGNOSIS — I129 Hypertensive chronic kidney disease with stage 1 through stage 4 chronic kidney disease, or unspecified chronic kidney disease: Secondary | ICD-10-CM | POA: Diagnosis not present

## 2020-09-17 DIAGNOSIS — I2510 Atherosclerotic heart disease of native coronary artery without angina pectoris: Secondary | ICD-10-CM | POA: Diagnosis not present

## 2020-09-17 DIAGNOSIS — E7849 Other hyperlipidemia: Secondary | ICD-10-CM | POA: Diagnosis not present

## 2020-09-17 NOTE — Telephone Encounter (Signed)
Noted. Nothing further needed. 

## 2020-09-19 ENCOUNTER — Telehealth: Payer: Self-pay | Admitting: Pharmacy Technician

## 2020-09-19 ENCOUNTER — Ambulatory Visit (INDEPENDENT_AMBULATORY_CARE_PROVIDER_SITE_OTHER): Payer: Medicare Other | Admitting: Pulmonary Disease

## 2020-09-19 ENCOUNTER — Encounter: Payer: Self-pay | Admitting: Pulmonary Disease

## 2020-09-19 ENCOUNTER — Other Ambulatory Visit: Payer: Self-pay

## 2020-09-19 VITALS — BP 130/80 | HR 76 | Temp 97.3°F | Ht 72.0 in | Wt 244.0 lb

## 2020-09-19 DIAGNOSIS — J84112 Idiopathic pulmonary fibrosis: Secondary | ICD-10-CM | POA: Diagnosis not present

## 2020-09-19 DIAGNOSIS — I25119 Atherosclerotic heart disease of native coronary artery with unspecified angina pectoris: Secondary | ICD-10-CM | POA: Diagnosis not present

## 2020-09-19 DIAGNOSIS — Z5181 Encounter for therapeutic drug level monitoring: Secondary | ICD-10-CM

## 2020-09-19 LAB — PULMONARY FUNCTION TEST
DL/VA % pred: 86 %
DL/VA: 3.45 ml/min/mmHg/L
DLCO cor % pred: 41 %
DLCO cor: 11.3 ml/min/mmHg
DLCO unc % pred: 44 %
DLCO unc: 12.05 ml/min/mmHg
FEF 25-75 Post: 2.27 L/sec
FEF 25-75 Pre: 1.64 L/sec
FEF2575-%Change-Post: 38 %
FEF2575-%Pred-Post: 88 %
FEF2575-%Pred-Pre: 63 %
FEV1-%Change-Post: 9 %
FEV1-%Pred-Post: 54 %
FEV1-%Pred-Pre: 49 %
FEV1-Post: 1.87 L
FEV1-Pre: 1.7 L
FEV1FVC-%Change-Post: 7 %
FEV1FVC-%Pred-Pre: 108 %
FEV6-%Change-Post: 1 %
FEV6-%Pred-Post: 48 %
FEV6-%Pred-Pre: 47 %
FEV6-Post: 2.14 L
FEV6-Pre: 2.12 L
FEV6FVC-%Pred-Post: 105 %
FEV6FVC-%Pred-Pre: 105 %
FVC-%Change-Post: 1 %
FVC-%Pred-Post: 46 %
FVC-%Pred-Pre: 45 %
FVC-Post: 2.18 L
FVC-Pre: 2.14 L
Post FEV1/FVC ratio: 86 %
Post FEV6/FVC ratio: 100 %
Pre FEV1/FVC ratio: 80 %
Pre FEV6/FVC Ratio: 100 %
RV % pred: 45 %
RV: 1.19 L
TLC % pred: 44 %
TLC: 3.33 L

## 2020-09-19 MED ORDER — PROCHLORPERAZINE MALEATE 10 MG PO TABS: 10.0000 mg | ORAL_TABLET | Freq: Four times a day (QID) | ORAL | 0 refills | Status: DC | PRN

## 2020-09-19 NOTE — Progress Notes (Signed)
Maurice Munoz    [id number]    [birth year]  Primary Care Physician:Fusco, Purcell Nails, MD  Referring Physician: Redmond School, [place],  [place] [address]  Chief complaint: Follow-up for Idiopathic pulmonary fibrosis Maurice Munoz January 1952  HPI: 75 year old with history of coronary artery disease, pulmonary fibrosis.  Previously followed by [name].  Prior CT scan shows pulmonary fibrosis which is thought to be secondary to occupational exposure Referred back for worsening dyspnea Discussed at multidisciplinary conference on 07/03/19 with diagnosis of IPF and started on Esbriet  Patient has established coronary artery disease status post multiple stenting in 2019.  He also has residual circumflex disease that was not intervened upon.  Pets: Dog.  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  No ongoing exposures.  No mold, hot tub, Jacuzzi or down pillows or comforters Smoking history: 75-pack-year smoker.  Quit in 1990 Travel history: No significant travel history Relevant family history: Father died of lung cancer.  He was a smoker.  Interim history: Tolerating Esbriet well. He has nausea symptoms mostly with the first dose with rest.  He is taking Zofran with some relief No change in chronic dyspnea on exertion.  Patient tells me that he is having some issues getting the [place] covered with patient assistance  Outpatient Encounter Medications as of 09/19/2020  Medication Sig  . allopurinol (ZYLOPRIM) 300 MG tablet Take 300 mg by mouth every morning.   Marland Kitchen amLODipine (NORVASC) 5 MG tablet TAKE ONE TABLET (5MG  TOTAL) BY MOUTH DAILY  . aspirin EC 81 MG tablet Take 81 mg by mouth daily.  Marland Kitchen atorvastatin (LIPITOR) 80 MG tablet TAKE ONE TABLET (80MG  TOTAL) BY MOUTH AT6PM  . b complex vitamins tablet Take 1 tablet by  mouth daily.  . benzonatate (TESSALON) 200 MG capsule TAKE ONE CAPSULE (200MG  TOTAL) BY MOUTH TWO TIMES DAILY AS NEEDED FOR COUGH  . cholecalciferol (VITAMIN D) 1000 units tablet Take 5,000 Units by mouth every morning.   . clopidogrel (PLAVIX) 75 MG tablet TAKE ONE (1) TABLET BY MOUTH EVERY DAY  . isosorbide mononitrate (IMDUR) 60 MG 24 hr tablet Take 1 tablet (60 mg total) by mouth daily.  . metoprolol tartrate (LOPRESSOR) 50 MG tablet Take 1.5 tablet (75 mg) in the AM, and 1 tablet (50 mg) in the PM.  . ondansetron (ZOFRAN) 4 MG tablet Take 1 tablet (4 mg total) by mouth every 8 (eight) hours as needed for nausea or vomiting.  Marland Kitchen oxyCODONE-acetaminophen (PERCOCET) 10-325 MG per tablet Take 1 tablet by mouth every 8 (eight) hours as needed for pain.   . Pirfenidone (ESBRIET) 801 MG TABS Take 1 tablet by mouth 3 (three) times daily with meals.  Marland Kitchen tiZANidine (ZANAFLEX) 4 MG tablet Take 4 mg by mouth at bedtime.   . torsemide (DEMADEX) 20 MG tablet Take 1 tablet (20 mg total) by mouth daily. IF ADDITIONAL SWELLING, MAY TAKE AN ADDITIONAL DOSE IN THE AFTERNOON  . nitroGLYCERIN (NITROSTAT) 0.4 MG SL tablet Place 1 tablet (0.4 mg total) under the tongue every 5 (five) minutes as needed for chest pain.   No facility-administered encounter medications on file as of 09/19/2020.   Physical Exam: Blood pressure 130/80, pulse 76, temperature (!) 97.3 F (36.3 C), temperature source Temporal, height 6' (1.829 m), weight 244 lb (110.7 kg), SpO2 97 %. Gen:  No acute distress HEENT:  EOMI, sclera anicteric Neck:     No masses; no thyromegaly Lungs:    Bibasal crackles CV:         Regular rate and rhythm; no murmurs Abd:      + bowel sounds; soft, non-tender; no palpable masses, no distension Ext:    No edema; adequate peripheral perfusion Skin:      Warm and dry; no rash Neuro: alert and oriented x 3 Psych: normal mood and affectaffect  Data Reviewed: Imaging: CT high-resolution February 05, 2015  patchy areas of groundglass attenuation, septal thickening with mild basal gradient. CT chest high-resolution 05/09/2016-subpleural reticulation, mild traction bronchiolectasis, mild honeycombing.  Definite UIP pattern high-resolution  CT chest high-resolution 04/24/2019-progression of fibrotic lung lung disease and definite UIP pattern. I have reviewed the images personally.  PFTs: 04/29/2017 FVC 2.66 [55%], FEV1 2.35 [66%], F/F 88, TLC 4.10 [55%], DLCO 15.60 [44%] Moderate restriction with severe diffusion defect.  Worsening lung function compared to 2016.  05/28/2019 FVC 2.34 [49%], FEV1 1.96 [56%], F/F 84, TLC 3.50 [47%], DLCO 12.72 [46%] Severe restriction, diffusion defect.  Worsening lung volumes and diffusion capacity compared to 2008  09/19/2020 FVC 2.18 [46%), FEV1 1.87 (54%], F/F 86, TLC 3.33 [44%], DLCO 12.05 [44%] Severe restriction, diffusion defect with worsening  Labs: CTD serologies 02/23/2017-negative ANA, Jo 1, centromere antibody, rheumatoid factor, Ro, La, SCL 70  Repeat CTD serologies-negative ANA, CCP CK, aldolase, myositis panel, SCL 70, Ro, La Rheumatoid factor 16  Hepatic panel 08/25/2020-within normal limits  Assessment:  Idiopathic pulmonary fibrosis He has 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  Although there is some occupational exposures he has not had ongoing exposure since 2000.  He does not have significant exposure history or signs and symptoms of connective tissue disease.  CTD serologies significant for borderline rheumatoid factor which is nonsignificant  Discussed at multidisciplinary conference diagnosis of IPF Continue Esbriet.  Has occasional nausea and fatigue with Esbriet.  Will check with pharmacy about renewing patient assistance Zofran as needed Recent LFTs are normal.  Follow-up in 6 months  Plan/Recommendations: - Continue esbriet.  Review patient assistance with pharmacy - Follow-up in 6 months   Marshell Garfinkel MD [facility] 09/19/2020, 10:18 AM  CC: Redmond School, MD

## 2020-09-19 NOTE — Progress Notes (Signed)
Full PFT performed today. °

## 2020-09-19 NOTE — Patient Instructions (Signed)
Full PFT performed today. °

## 2020-09-19 NOTE — Patient Instructions (Signed)
I am glad you are stable with the medication Continues. I will check with pharmacy about continuing patient assistance Your recent liver test was normal  Follow-up in 6 months.

## 2020-09-19 NOTE — Telephone Encounter (Signed)
Called [employer] to check status on patient's eligibility. Rep advised that they recent initiated review of patient's eligibility for their Esbriet patient assistance and it was determined that he still qualifies. Patient will remain on patient assistance. Called patient and advised.  He will contact office with any questions or issues with shipments.

## 2020-09-19 NOTE — Addendum Note (Signed)
Addended by: Gavin Potters R on: 09/19/2020 11:00 AM   Modules accepted: Orders

## 2020-09-24 NOTE — Progress Notes (Signed)
Patient remains enrolled into [place] patient assistance for Esbriet. Patient has been advised of this and verbalized understanding. Nothing further needed.

## 2020-10-01 DIAGNOSIS — N200 Calculus of kidney: Secondary | ICD-10-CM | POA: Diagnosis not present

## 2020-10-01 DIAGNOSIS — N1831 Chronic kidney disease, stage 3a: Secondary | ICD-10-CM | POA: Diagnosis not present

## 2020-10-01 DIAGNOSIS — E6609 Other obesity due to excess calories: Secondary | ICD-10-CM | POA: Diagnosis not present

## 2020-10-01 DIAGNOSIS — I129 Hypertensive chronic kidney disease with stage 1 through stage 4 chronic kidney disease, or unspecified chronic kidney disease: Secondary | ICD-10-CM | POA: Diagnosis not present

## 2020-10-01 DIAGNOSIS — R809 Proteinuria, unspecified: Secondary | ICD-10-CM | POA: Diagnosis not present

## 2020-10-01 DIAGNOSIS — I5032 Chronic diastolic (congestive) heart failure: Secondary | ICD-10-CM | POA: Diagnosis not present

## 2020-10-01 DIAGNOSIS — Z5181 Encounter for therapeutic drug level monitoring: Secondary | ICD-10-CM | POA: Diagnosis not present

## 2020-10-01 DIAGNOSIS — E559 Vitamin D deficiency, unspecified: Secondary | ICD-10-CM | POA: Diagnosis not present

## 2020-10-13 DIAGNOSIS — Z6833 Body mass index (BMI) 33.0-33.9, adult: Secondary | ICD-10-CM | POA: Diagnosis not present

## 2020-10-13 DIAGNOSIS — G894 Chronic pain syndrome: Secondary | ICD-10-CM | POA: Diagnosis not present

## 2020-10-13 DIAGNOSIS — M5137 Other intervertebral disc degeneration, lumbosacral region: Secondary | ICD-10-CM | POA: Diagnosis not present

## 2020-10-13 DIAGNOSIS — M48061 Spinal stenosis, lumbar region without neurogenic claudication: Secondary | ICD-10-CM | POA: Diagnosis not present

## 2020-10-13 DIAGNOSIS — I700 Atherosclerosis of aorta: Secondary | ICD-10-CM | POA: Diagnosis not present

## 2020-10-13 DIAGNOSIS — M1991 Primary osteoarthritis, unspecified site: Secondary | ICD-10-CM | POA: Diagnosis not present

## 2020-10-18 DIAGNOSIS — N1831 Chronic kidney disease, stage 3a: Secondary | ICD-10-CM | POA: Diagnosis not present

## 2020-10-18 DIAGNOSIS — I2510 Atherosclerotic heart disease of native coronary artery without angina pectoris: Secondary | ICD-10-CM | POA: Diagnosis not present

## 2020-10-18 DIAGNOSIS — E7849 Other hyperlipidemia: Secondary | ICD-10-CM | POA: Diagnosis not present

## 2020-10-18 DIAGNOSIS — I129 Hypertensive chronic kidney disease with stage 1 through stage 4 chronic kidney disease, or unspecified chronic kidney disease: Secondary | ICD-10-CM | POA: Diagnosis not present

## 2020-11-10 DIAGNOSIS — M48061 Spinal stenosis, lumbar region without neurogenic claudication: Secondary | ICD-10-CM | POA: Diagnosis not present

## 2020-11-10 DIAGNOSIS — G894 Chronic pain syndrome: Secondary | ICD-10-CM | POA: Diagnosis not present

## 2020-11-10 DIAGNOSIS — Z6833 Body mass index (BMI) 33.0-33.9, adult: Secondary | ICD-10-CM | POA: Diagnosis not present

## 2020-11-10 DIAGNOSIS — M1991 Primary osteoarthritis, unspecified site: Secondary | ICD-10-CM | POA: Diagnosis not present

## 2020-11-10 DIAGNOSIS — I700 Atherosclerosis of aorta: Secondary | ICD-10-CM | POA: Diagnosis not present

## 2020-11-10 DIAGNOSIS — M5137 Other intervertebral disc degeneration, lumbosacral region: Secondary | ICD-10-CM | POA: Diagnosis not present

## 2020-11-10 DIAGNOSIS — E6609 Other obesity due to excess calories: Secondary | ICD-10-CM | POA: Diagnosis not present

## 2020-11-18 DIAGNOSIS — E7849 Other hyperlipidemia: Secondary | ICD-10-CM | POA: Diagnosis not present

## 2020-11-18 DIAGNOSIS — I129 Hypertensive chronic kidney disease with stage 1 through stage 4 chronic kidney disease, or unspecified chronic kidney disease: Secondary | ICD-10-CM | POA: Diagnosis not present

## 2020-11-18 DIAGNOSIS — I2510 Atherosclerotic heart disease of native coronary artery without angina pectoris: Secondary | ICD-10-CM | POA: Diagnosis not present

## 2020-11-18 DIAGNOSIS — N1831 Chronic kidney disease, stage 3a: Secondary | ICD-10-CM | POA: Diagnosis not present

## 2020-11-20 DIAGNOSIS — I2583 Coronary atherosclerosis due to lipid rich plaque: Secondary | ICD-10-CM | POA: Diagnosis not present

## 2020-11-20 DIAGNOSIS — Z79899 Other long term (current) drug therapy: Secondary | ICD-10-CM | POA: Diagnosis not present

## 2020-11-20 DIAGNOSIS — Z1379 Encounter for other screening for genetic and chromosomal anomalies: Secondary | ICD-10-CM | POA: Diagnosis not present

## 2020-11-20 DIAGNOSIS — F339 Major depressive disorder, recurrent, unspecified: Secondary | ICD-10-CM | POA: Diagnosis not present

## 2020-11-20 DIAGNOSIS — F419 Anxiety disorder, unspecified: Secondary | ICD-10-CM | POA: Diagnosis not present

## 2020-12-10 ENCOUNTER — Encounter (HOSPITAL_COMMUNITY): Payer: Self-pay

## 2020-12-10 ENCOUNTER — Other Ambulatory Visit: Payer: Self-pay

## 2020-12-10 ENCOUNTER — Emergency Department (HOSPITAL_COMMUNITY)
Admission: EM | Admit: 2020-12-10 | Discharge: 2020-12-10 | Disposition: A | Discharge status: Home / Self Care | Payer: Medicare Other | Attending: Emergency Medicine | Admitting: Emergency Medicine

## 2020-12-10 ENCOUNTER — Emergency Department (HOSPITAL_COMMUNITY): Payer: Medicare Other

## 2020-12-10 DIAGNOSIS — Z87891 Personal history of nicotine dependence: Secondary | ICD-10-CM | POA: Insufficient documentation

## 2020-12-10 DIAGNOSIS — Z955 Presence of coronary angioplasty implant and graft: Secondary | ICD-10-CM | POA: Diagnosis not present

## 2020-12-10 DIAGNOSIS — I2510 Atherosclerotic heart disease of native coronary artery without angina pectoris: Secondary | ICD-10-CM | POA: Diagnosis not present

## 2020-12-10 DIAGNOSIS — Z79899 Other long term (current) drug therapy: Secondary | ICD-10-CM | POA: Diagnosis not present

## 2020-12-10 DIAGNOSIS — U071 COVID-19: Secondary | ICD-10-CM | POA: Insufficient documentation

## 2020-12-10 DIAGNOSIS — R059 Cough, unspecified: Secondary | ICD-10-CM | POA: Diagnosis not present

## 2020-12-10 DIAGNOSIS — Z7982 Long term (current) use of aspirin: Secondary | ICD-10-CM | POA: Insufficient documentation

## 2020-12-10 DIAGNOSIS — N183 Chronic kidney disease, stage 3 unspecified: Secondary | ICD-10-CM | POA: Insufficient documentation

## 2020-12-10 DIAGNOSIS — I517 Cardiomegaly: Secondary | ICD-10-CM | POA: Diagnosis not present

## 2020-12-10 DIAGNOSIS — R0602 Shortness of breath: Secondary | ICD-10-CM | POA: Diagnosis not present

## 2020-12-10 DIAGNOSIS — I129 Hypertensive chronic kidney disease with stage 1 through stage 4 chronic kidney disease, or unspecified chronic kidney disease: Secondary | ICD-10-CM | POA: Diagnosis not present

## 2020-12-10 LAB — BASIC METABOLIC PANEL
Anion gap: 7 (ref 5–15)
BUN: 22 mg/dL (ref 8–23)
CO2: 29 mmol/L (ref 22–32)
Calcium: 8.8 mg/dL — ABNORMAL LOW (ref 8.9–10.3)
Chloride: 102 mmol/L (ref 98–111)
Creatinine, Ser: 1.17 mg/dL (ref 0.61–1.24)
GFR, Estimated: >60 mL/min (ref >60)
Glucose, Bld: 78 mg/dL (ref 70–99)
Potassium: 3.9 mmol/L (ref 3.5–5.1)
Sodium: 138 mmol/L (ref 135–145)

## 2020-12-10 LAB — CBC WITH DIFFERENTIAL/PLATELET
Abs Immature Granulocytes: 0.04 10*3/uL (ref 0.00–0.07)
Basophils Absolute: 0 10*3/uL (ref 0.0–0.1)
Basophils Relative: 0 %
Eosinophils Absolute: 0.2 10*3/uL (ref 0.0–0.5)
Eosinophils Relative: 2 %
HCT: 47.4 % (ref 39.0–52.0)
Hemoglobin: 15.7 g/dL (ref 13.0–17.0)
Immature Granulocytes: 0 %
Lymphocytes Relative: 10 %
Lymphs Abs: 0.9 10*3/uL (ref 0.7–4.0)
MCH: 30.2 pg (ref 26.0–34.0)
MCHC: 33.1 g/dL (ref 30.0–36.0)
MCV: 91.2 fL (ref 80.0–100.0)
Monocytes Absolute: 0.9 10*3/uL (ref 0.1–1.0)
Monocytes Relative: 10 %
Neutro Abs: 7.2 10*3/uL (ref 1.7–7.7)
Neutrophils Relative %: 78 %
Platelets: 204 10*3/uL (ref 150–400)
RBC: 5.2 MIL/uL (ref 4.22–5.81)
RDW: 14.7 % (ref 11.5–15.5)
WBC: 9.2 10*3/uL (ref 4.0–10.5)
nRBC: 0 % (ref 0.0–0.2)

## 2020-12-10 MED ORDER — ALBUTEROL SULFATE HFA 108 (90 BASE) MCG/ACT IN AERS
2.0000 | INHALATION_SPRAY | Freq: Once | RESPIRATORY_TRACT | Status: AC
  Administered 2020-12-10: 2 via RESPIRATORY_TRACT
  Filled 2020-12-10: qty 6.7

## 2020-12-10 MED ORDER — ALBUTEROL SULFATE HFA 108 (90 BASE) MCG/ACT IN AERS
INHALATION_SPRAY | RESPIRATORY_TRACT | Status: AC
  Filled 2020-12-10: qty 6.7

## 2020-12-10 MED ORDER — MOLNUPIRAVIR EUA 200MG CAPSULE: 4.0000 | ORAL_CAPSULE | Freq: Two times a day (BID) | ORAL | 0 refills | Status: AC

## 2020-12-10 NOTE — ED Notes (Signed)
o2 sat decreased to 85-87% on ra.  Placed on 2 lpm o2 via [place].  md informed

## 2020-12-10 NOTE — ED Triage Notes (Signed)
Pt reports testing positive for covid today, had a lot of coughing fits, so he wanted to get checked out.

## 2020-12-10 NOTE — ED Provider Notes (Signed)
[facility] EMERGENCY DEPARTMENT Provider Note   CSN: [id number] Arrival date & time: 12/10/20  1635     History Chief Complaint  Patient presents with   Covid Positive    Maurice Munoz is a 75 y.o. male.  HPI      Maurice Munoz is a 75 y.o. male with past medical history of pulmonary fibrosis, CAD, stage III CKD, HTN, and chronic respiratory failure on home oxygen PRN, who presents to the Emergency Department requesting evaluation for covid.  He states he developed cough and burning central chest pain yesterday.  Burning sensation of his chest associated with cough only.  Reports cough is mostly dry.  He took a home COVID test today that was positive and felt that due to his comorbidities, he needed to be evaluated in the emergency department.  He denies any increased shortness of breath, abdominal pain, vomiting or diarrhea.  He has been vaccinated for COVID x2 and had 1 booster.    Past Medical History:  Diagnosis Date   Adrenal adenoma, left    small   Aortic atherosclerosis (HCC)    Back pain    Bilateral carotid artery stenosis followed by dr Claiborne Billings   per last carotid duplex [id number]  -- proximal LICA 82-50% ,  RICA 5-39%   CAD (coronary artery disease)    a. s/p prior intervention to RCA and PDA in 1999 b. DES to LAD and distal RCA in 2007   Cardiomegaly    Stable   Chronic constipation    Chronic pain syndrome    CKD (chronic kidney disease), stage III (HCC)    DDD (degenerative disc disease), cervical    DDD (degenerative disc disease), lumbosacral    Dyspnea    Dyspnea on exertion    Gout    per pt last inflared episode 2015 approx.   Grade II diastolic dysfunction    H/O epistaxis    per pt sees ENT dr Benjamine Mola for cauterization (pt takes plavix)   History of acute pancreatitis 08/08/2014   History of kidney stones    HTN (hypertension)    Hyperlipidemia    Interstitial lung disease (HCC)    Left arm pain    s/p fall   Nocturia    Numbness of  right foot    due to DDD lumbar   Peripheral edema    Pilonidal cyst    Pleural effusion on right    Pulmonary fibrosis ([place])    followed by pcp-- last chest CT 04-08-2016   S/P drug eluting coronary stent placement 10/11/2005   mLAD and dRCA   Wears dentures    upper   Wears glasses     Patient Active Problem List   Diagnosis Date Noted   Chronic respiratory failure with hypoxia ([place]) 12/17/2019   Therapeutic drug monitoring 12/17/2019   NSTEMI (non-ST elevated myocardial infarction) ([place]) 04/13/2018   CKD (chronic kidney disease) stage 3, GFR 30-59 ml/min (HCC) 04/12/2018   IPF (idiopathic pulmonary fibrosis) ([place]) 04/12/2018   Essential hypertension    CAD in native artery 04/11/2018   Arthralgia of right temporomandibular joint 02/03/2016   Hyperlipidemia LDL goal <70 09/25/2014   Lower extremity edema 09/25/2014   Nausea    Upper abdominal pain    Pancreatitis-Idiopathy acute 2.18.16 admitted [facility] 08/08/2014   Gout 08/08/2014   CAD s/p stenting 2007, prior LAD h/o 199, last myoview 3/15 Low risk 11/27/2012   Edema 11/27/2012   Obesity (BMI 30-39.9) 11/27/2012  Cervical disc disease s/p diskecktomy 2003  11/27/2012   GOUT 10/13/2009   KNEE PAIN 09/08/2009   PATELLAR TENDINITIS 09/08/2009    Past Surgical History:  Procedure Laterality Date   ANTERIOR CERVICAL DECOMP/DISCECTOMY FUSION  12-25-2001     dr Orinda [facility]   C3 -- C7   ANTERIOR CERVICAL DECOMP/DISCECTOMY FUSION  12-11-2012   dr Carloyn Manner  [facility])   CARDIOVASCULAR STRESS TEST  08-23-2012    dr Claiborne Billings   normal lexiscan nuclear study w/ no ischemia/  normal LV function and wall motion, ef 65%   CARDIOVASCULAR STRESS TEST     CORONARY ANGIOPLASTY  1999   PTCA to RCA & PDA   CORONARY ANGIOPLASTY WITH STENT PLACEMENT  10-11-2005  dr Shelva Majestic   PCI and stenting to [place] ([place]ypher DES x2)   CORONARY BALLOON ANGIOPLASTY N/A 04/14/2018   Procedure: CORONARY BALLOON ANGIOPLASTY;  Surgeon: Troy Sine, MD;  Location: [place] CV LAB;  Service: Cardiovascular;  Laterality: N/A;   CORONARY STENT INTERVENTION N/A 04/14/2018   Procedure: CORONARY STENT INTERVENTION;  Surgeon: Troy Sine, MD;  Location: [place] [facility];  Service: Cardiovascular;  Laterality: N/A;   CYSTOSCOPY W/ URETERAL STENT PLACEMENT Left 02-11-2010     [facility]   LEFT HEART CATH N/A 04/14/2018   Procedure: Left Heart Cath;  Surgeon: Troy Sine, MD;  Location: [place] [facility];  Service: Cardiovascular;  Laterality: N/A;   LEFT HEART CATH AND CORONARY ANGIOGRAPHY N/A 04/12/2018   Procedure: LEFT HEART CATH AND CORONARY ANGIOGRAPHY;  Surgeon: Jettie Booze, MD;  Location: [place] CV [facility];  Service: Cardiovascular;  Laterality: N/A;   LUMBAR LAMINECTOMY  2015   L5 -- S1   PILONIDAL CYST EXCISION  1980s   PILONIDAL CYST EXCISION N/A 03/31/2017   Procedure: EXCISION OF PILONIDAL DISEASE with Flap Rotation;  Surgeon: Leighton Ruff, MD;  Location: [place];  Service: General;  Laterality: N/A;   [place]   per pt "removal piece of cryloid(?) that was pressing against throat"  states no issues since   TRANSTHORACIC ECHOCARDIOGRAM  08-24-2010  dr Claiborne Billings   ef 50-55%/  mild MR/  trivial TR   WOUND DEBRIDEMENT N/A 10/06/2017   Procedure: DEBRIDEMENT WOUND PLACEMENT OF WOUND HEALING MATRIX;  Surgeon: Leighton Ruff, MD;  Location: [place];  Service: General;  Laterality: N/A;       Family History  Problem Relation Age of Onset   Cancer Father    Heart attack Brother    Diabetes Brother    Colon cancer Neg Hx     Social History   Tobacco Use   Smoking status: Former    Packs/day: 3.00    Years: 25.00    Pack years: 75.00    Types: Cigarettes    Quit date: 11/27/1988    Years since quitting: 32.0   Smokeless tobacco: Former    Quit date: 03/28/1989  Vaping Use   Vaping Use: Never used  Substance Use Topics   Alcohol use: No   Drug use: No     Home Medications Prior to Admission medications   Medication Sig Start Date End Date Taking? Authorizing Provider  allopurinol (ZYLOPRIM) 300 MG tablet Take 300 mg by mouth every morning.  11/06/12   [provider]  amLODipine (NORVASC) 5 MG tablet TAKE ONE TABLET (5MG  TOTAL) BY MOUTH DAILY 11/06/19   Troy Sine, MD  aspirin EC  81 MG tablet Take 81 mg by mouth daily.    [provider]  atorvastatin (LIPITOR) 80 MG tablet TAKE ONE TABLET (80MG  TOTAL) BY MOUTH AT6PM 12/06/19   Troy Sine, MD  b complex vitamins tablet Take 1 tablet by mouth daily.    [provider]  benzonatate (TESSALON) 200 MG capsule TAKE ONE CAPSULE (200MG  TOTAL) BY MOUTH TWO TIMES DAILY AS NEEDED FOR COUGH 07/23/20   Mannam, Praveen, MD  cholecalciferol (VITAMIN D) 1000 units tablet Take 5,000 Units by mouth every morning.     [provider]  clopidogrel (PLAVIX) 75 MG tablet TAKE ONE (1) TABLET BY MOUTH EVERY DAY 09/15/20   Troy Sine, MD  isosorbide mononitrate (IMDUR) 60 MG 24 hr tablet Take 1 tablet (60 mg total) by mouth daily. 11/16/19   Troy Sine, MD  metoprolol tartrate (LOPRESSOR) 50 MG tablet Take 1.5 tablet (75 mg) in the AM, and 1 tablet (50 mg) in the PM. 08/19/20   Troy Sine, MD  nitroGLYCERIN (NITROSTAT) 0.4 MG SL tablet Place 1 tablet (0.4 mg total) under the tongue every 5 (five) minutes as needed for chest pain. 08/03/17 02/20/20  Troy Sine, MD  oxyCODONE-acetaminophen (PERCOCET) 10-325 MG per tablet Take 1 tablet by mouth every 8 (eight) hours as needed for pain.  10/11/12   [provider]  Pirfenidone (ESBRIET) 801 MG TABS Take 1 tablet by mouth 3 (three) times daily with meals. 05/08/20   Mannam, Hart Robinsons, MD  prochlorperazine (COMPAZINE) 10 MG tablet Take 1 tablet (10 mg total) by mouth every 6 (six) hours as needed for nausea or vomiting. 09/19/20   Mannam, Hart Robinsons, MD  tiZANidine (ZANAFLEX) 4 MG tablet Take 4 mg by mouth at bedtime.   11/08/12   [provider]  torsemide (DEMADEX) 20 MG tablet Take 1 tablet (20 mg total) by mouth daily. IF ADDITIONAL SWELLING, MAY TAKE AN ADDITIONAL DOSE IN THE AFTERNOON 11/16/19   Troy Sine, MD    Allergies    Ibuprofen, Neosporin [neomycin-bacitracin zn-polymyx], and Penicillins  Review of Systems   Review of Systems  Constitutional:  Negative for appetite change, chills and fever.  HENT:  Negative for congestion.   Respiratory:  Positive for cough.   Cardiovascular:  Positive for chest pain (burning mid chest pain).  Gastrointestinal:  Negative for diarrhea, nausea and vomiting.  Genitourinary:  Negative for difficulty urinating and dysuria.  Musculoskeletal:  Negative for arthralgias, back pain and neck pain.  Skin:  Negative for rash.  Neurological:  Negative for dizziness, syncope, weakness, numbness and headaches.   Physical Exam Updated Vital Signs Ht 6' (1.829 m)   Wt 109.3 kg   BMI 32.69 kg/m   Physical Exam Vitals and nursing note reviewed.  Constitutional:      Appearance: Normal appearance. He is not ill-appearing or toxic-appearing.  Eyes:     Conjunctiva/sclera: Conjunctivae normal.  Cardiovascular:     Rate and Rhythm: Normal rate and regular rhythm.     Pulses: Normal pulses.  Pulmonary:     Effort: Pulmonary effort is normal. No respiratory distress.     Breath sounds: Normal breath sounds. No stridor. No wheezing.     Comments: Lungs clear to auscultation bilaterally.  No increased work of breathing. Abdominal:     General: There is no distension.     Palpations: Abdomen is soft.     Tenderness: There is no abdominal tenderness.  Musculoskeletal:  General: No tenderness.     Right lower leg: No edema.     Left lower leg: No edema.  Lymphadenopathy:     Cervical: No cervical adenopathy.  Skin:    General: Skin is warm.     Capillary Refill: Capillary refill takes less than 2 seconds.     Findings: No erythema or rash.   Neurological:     General: No focal deficit present.     Mental Status: He is alert.     Sensory: No sensory deficit.     Motor: No weakness.    ED Results / Procedures / Treatments   Labs (all labs ordered are listed, but only abnormal results are displayed) Labs Reviewed  BASIC METABOLIC PANEL - Abnormal; Notable for the following components:      Result Value   Calcium 8.8 (*)    All other components within normal limits  CBC WITH DIFFERENTIAL/PLATELET    EKG None  Radiology DG Chest Portable 1 View  Result Date: 12/10/2020 CLINICAL DATA:  Cough short of breath EXAM: PORTABLE CHEST 1 VIEW COMPARISON:  Radiograph 04/11/2018, CT 11 320 FINDINGS: Large cardiac silhouette. LEFT post hypertrophy of the mediastinum on comparison CT. There is streaky peripheral opacities in LEFT RIGHT lung which compared to prior CT. IMPRESSION: 1. No clear acute process. 2. Peripheral interstitial lung disease at seen on comparison CT. Cannot exclude superimposed peripheral pneumonia. 3. Cardiomegaly Electronically Signed   By: Suzy Bouchard M.D.   On: 12/10/2020 18:31     Procedures Procedures   Medications Ordered in ED Medications - No data to display  ED Course  I have reviewed the triage vital signs and the nursing notes.  Pertinent labs & imaging results that were available during my care of the patient were reviewed by me and considered in my medical decision making (see chart for details).    MDM Rules/Calculators/A&P                          Patient here with history of pulmonary fibrosis, stage III CKD, history of coronary artery disease and hypertension.  Had a positive home COVID test earlier today.  Current symptoms are burning chest pain associated with cough only.  No increase shortness of breath and uses home oxygen at 2L [place] prn.    Labs show no leukocytosis, and kidney function is wnml.  CXR shows no clear acute process.  Sats here mostly in the mid to low 90's although  he had some brief episodes of sats in the upper 80's.  He was placed on O2 at 2L's and quickly improved.  No respiratory distress on recheck.  I have discussed treatment options with him.  Given his current medications, he is not a candidate for Paxlovid, but molnupiravir may be of benefit given his many comorbities. Pt agreeable.  He will also wear his oxygen at 2L during his illness course.  Return precautions also discussed.   Maurice Munoz was evaluated in Emergency Department on 12/10/2020 for the symptoms described in the history of present illness. He was evaluated in the context of the global COVID-19 pandemic, which necessitated consideration that the patient might be at risk for infection with the SARS-CoV-2 virus that causes COVID-19. Institutional protocols and algorithms that pertain to the evaluation of patients at risk for COVID-19 are in a state of rapid change based on information released by regulatory bodies including the CDC and federal and state organizations.  These policies and algorithms were followed during the patient's care in the ED.  Final Clinical Impression(s) / ED Diagnoses Final diagnoses:  COVID-19 virus infection    Rx / DC Orders ED Discharge Orders     None        Kem Parkinson, PA-C 12/12/20 1452    Luna Fuse, MD 12/26/20 [phone]

## 2020-12-10 NOTE — Discharge Instructions (Addendum)
Use the albuterol inhaler, 1 to 2 puffs every 4-6 hours as needed.  You have been prescribed an antiviral medication that may help shorten the duration of your COVID symptoms.  I recommend that you use your home oxygen at 2 L continuously while you are sick.  Follow-up with your primary care provider for recheck.  Return to emergency department if you develop any new or worsening symptoms.

## 2020-12-18 DIAGNOSIS — I129 Hypertensive chronic kidney disease with stage 1 through stage 4 chronic kidney disease, or unspecified chronic kidney disease: Secondary | ICD-10-CM | POA: Diagnosis not present

## 2020-12-18 DIAGNOSIS — N1831 Chronic kidney disease, stage 3a: Secondary | ICD-10-CM | POA: Diagnosis not present

## 2020-12-18 DIAGNOSIS — E7849 Other hyperlipidemia: Secondary | ICD-10-CM | POA: Diagnosis not present

## 2020-12-18 DIAGNOSIS — I2510 Atherosclerotic heart disease of native coronary artery without angina pectoris: Secondary | ICD-10-CM | POA: Diagnosis not present

## 2020-12-24 DIAGNOSIS — I129 Hypertensive chronic kidney disease with stage 1 through stage 4 chronic kidney disease, or unspecified chronic kidney disease: Secondary | ICD-10-CM | POA: Diagnosis not present

## 2020-12-24 DIAGNOSIS — Z79899 Other long term (current) drug therapy: Secondary | ICD-10-CM | POA: Diagnosis not present

## 2020-12-24 DIAGNOSIS — D631 Anemia in chronic kidney disease: Secondary | ICD-10-CM | POA: Diagnosis not present

## 2020-12-24 DIAGNOSIS — N1831 Chronic kidney disease, stage 3a: Secondary | ICD-10-CM | POA: Diagnosis not present

## 2020-12-24 DIAGNOSIS — Z5181 Encounter for therapeutic drug level monitoring: Secondary | ICD-10-CM | POA: Diagnosis not present

## 2020-12-24 DIAGNOSIS — I5032 Chronic diastolic (congestive) heart failure: Secondary | ICD-10-CM | POA: Diagnosis not present

## 2020-12-24 DIAGNOSIS — N200 Calculus of kidney: Secondary | ICD-10-CM | POA: Diagnosis not present

## 2020-12-24 DIAGNOSIS — E559 Vitamin D deficiency, unspecified: Secondary | ICD-10-CM | POA: Diagnosis not present

## 2020-12-28 DIAGNOSIS — Z20822 Contact with and (suspected) exposure to covid-19: Secondary | ICD-10-CM | POA: Diagnosis not present

## 2020-12-29 DIAGNOSIS — U071 COVID-19: Secondary | ICD-10-CM | POA: Diagnosis not present

## 2020-12-29 DIAGNOSIS — G894 Chronic pain syndrome: Secondary | ICD-10-CM | POA: Diagnosis not present

## 2020-12-29 DIAGNOSIS — Z6832 Body mass index (BMI) 32.0-32.9, adult: Secondary | ICD-10-CM | POA: Diagnosis not present

## 2020-12-29 DIAGNOSIS — M48061 Spinal stenosis, lumbar region without neurogenic claudication: Secondary | ICD-10-CM | POA: Diagnosis not present

## 2020-12-29 DIAGNOSIS — M5137 Other intervertebral disc degeneration, lumbosacral region: Secondary | ICD-10-CM | POA: Diagnosis not present

## 2020-12-31 DIAGNOSIS — I5032 Chronic diastolic (congestive) heart failure: Secondary | ICD-10-CM | POA: Diagnosis not present

## 2020-12-31 DIAGNOSIS — R809 Proteinuria, unspecified: Secondary | ICD-10-CM | POA: Diagnosis not present

## 2020-12-31 DIAGNOSIS — E6609 Other obesity due to excess calories: Secondary | ICD-10-CM | POA: Diagnosis not present

## 2020-12-31 DIAGNOSIS — N200 Calculus of kidney: Secondary | ICD-10-CM | POA: Diagnosis not present

## 2020-12-31 DIAGNOSIS — N182 Chronic kidney disease, stage 2 (mild): Secondary | ICD-10-CM | POA: Diagnosis not present

## 2020-12-31 DIAGNOSIS — I129 Hypertensive chronic kidney disease with stage 1 through stage 4 chronic kidney disease, or unspecified chronic kidney disease: Secondary | ICD-10-CM | POA: Diagnosis not present

## 2021-01-06 ENCOUNTER — Other Ambulatory Visit: Payer: Self-pay | Admitting: Pulmonary Disease

## 2021-01-06 ENCOUNTER — Other Ambulatory Visit: Payer: Self-pay | Admitting: Cardiovascular Disease

## 2021-01-12 DIAGNOSIS — C4441 Basal cell carcinoma of skin of scalp and neck: Secondary | ICD-10-CM | POA: Diagnosis not present

## 2021-01-13 ENCOUNTER — Other Ambulatory Visit: Payer: Self-pay

## 2021-01-13 ENCOUNTER — Encounter (HOSPITAL_COMMUNITY): Payer: Self-pay | Admitting: Emergency Medicine

## 2021-01-13 DIAGNOSIS — I2510 Atherosclerotic heart disease of native coronary artery without angina pectoris: Secondary | ICD-10-CM | POA: Diagnosis not present

## 2021-01-13 DIAGNOSIS — I129 Hypertensive chronic kidney disease with stage 1 through stage 4 chronic kidney disease, or unspecified chronic kidney disease: Secondary | ICD-10-CM | POA: Insufficient documentation

## 2021-01-13 DIAGNOSIS — Z79899 Other long term (current) drug therapy: Secondary | ICD-10-CM | POA: Insufficient documentation

## 2021-01-13 DIAGNOSIS — Z87891 Personal history of nicotine dependence: Secondary | ICD-10-CM | POA: Insufficient documentation

## 2021-01-13 DIAGNOSIS — N183 Chronic kidney disease, stage 3 unspecified: Secondary | ICD-10-CM | POA: Insufficient documentation

## 2021-01-13 DIAGNOSIS — Z7982 Long term (current) use of aspirin: Secondary | ICD-10-CM | POA: Diagnosis not present

## 2021-01-13 DIAGNOSIS — Z7902 Long term (current) use of antithrombotics/antiplatelets: Secondary | ICD-10-CM | POA: Diagnosis not present

## 2021-01-13 DIAGNOSIS — L7622 Postprocedural hemorrhage and hematoma of skin and subcutaneous tissue following other procedure: Secondary | ICD-10-CM | POA: Insufficient documentation

## 2021-01-13 NOTE — ED Triage Notes (Signed)
Pt seen at Dr Juel Burrow office this morning and had a biopsy of skin behind L ear. Pt changed bandage as instructed this evening and could not get it to stop bleeding. Pt is currently on blood thinners. Bleeding uncontrolled at this time. Site covered with non-stick dsg, gauze, and wrapped with Kerlex and Coban to help control bleeding in triage.

## 2021-01-14 ENCOUNTER — Emergency Department (HOSPITAL_COMMUNITY)
Admission: EM | Admit: 2021-01-14 | Discharge: 2021-01-14 | Disposition: A | Discharge status: Home / Self Care | Payer: Medicare Other | Attending: Emergency Medicine | Admitting: Emergency Medicine

## 2021-01-14 DIAGNOSIS — I129 Hypertensive chronic kidney disease with stage 1 through stage 4 chronic kidney disease, or unspecified chronic kidney disease: Secondary | ICD-10-CM | POA: Diagnosis not present

## 2021-01-14 DIAGNOSIS — L7622 Postprocedural hemorrhage and hematoma of skin and subcutaneous tissue following other procedure: Secondary | ICD-10-CM | POA: Diagnosis not present

## 2021-01-14 DIAGNOSIS — N200 Calculus of kidney: Secondary | ICD-10-CM | POA: Diagnosis not present

## 2021-01-14 DIAGNOSIS — R809 Proteinuria, unspecified: Secondary | ICD-10-CM | POA: Diagnosis not present

## 2021-01-14 DIAGNOSIS — I5032 Chronic diastolic (congestive) heart failure: Secondary | ICD-10-CM | POA: Diagnosis not present

## 2021-01-14 DIAGNOSIS — E6609 Other obesity due to excess calories: Secondary | ICD-10-CM | POA: Diagnosis not present

## 2021-01-14 DIAGNOSIS — T148XXA Other injury of unspecified body region, initial encounter: Secondary | ICD-10-CM

## 2021-01-14 DIAGNOSIS — N182 Chronic kidney disease, stage 2 (mild): Secondary | ICD-10-CM | POA: Diagnosis not present

## 2021-01-14 LAB — CBC WITH DIFFERENTIAL/PLATELET
Abs Immature Granulocytes: 0.07 10*3/uL (ref 0.00–0.07)
Basophils Absolute: 0.1 10*3/uL (ref 0.0–0.1)
Basophils Relative: 1 %
Eosinophils Absolute: 0.1 10*3/uL (ref 0.0–0.5)
Eosinophils Relative: 0 %
HCT: 48.8 % (ref 39.0–52.0)
Hemoglobin: 16 g/dL (ref 13.0–17.0)
Immature Granulocytes: 1 %
Lymphocytes Relative: 12 %
Lymphs Abs: 1.7 10*3/uL (ref 0.7–4.0)
MCH: 30 pg (ref 26.0–34.0)
MCHC: 32.8 g/dL (ref 30.0–36.0)
MCV: 91.6 fL (ref 80.0–100.0)
Monocytes Absolute: 1.1 10*3/uL — ABNORMAL HIGH (ref 0.1–1.0)
Monocytes Relative: 8 %
Neutro Abs: 10.9 10*3/uL — ABNORMAL HIGH (ref 1.7–7.7)
Neutrophils Relative %: 78 %
Platelets: 231 10*3/uL (ref 150–400)
RBC: 5.33 MIL/uL (ref 4.22–5.81)
RDW: 14.7 % (ref 11.5–15.5)
WBC: 13.9 10*3/uL — ABNORMAL HIGH (ref 4.0–10.5)
nRBC: 0 % (ref 0.0–0.2)

## 2021-01-14 LAB — BASIC METABOLIC PANEL
Anion gap: 7 (ref 5–15)
BUN: 13 mg/dL (ref 8–23)
CO2: 27 mmol/L (ref 22–32)
Calcium: 9.2 mg/dL (ref 8.9–10.3)
Chloride: 101 mmol/L (ref 98–111)
Creatinine, Ser: 1.17 mg/dL (ref 0.61–1.24)
GFR, Estimated: >60 mL/min (ref >60)
Glucose, Bld: 113 mg/dL — ABNORMAL HIGH (ref 70–99)
Potassium: 4.4 mmol/L (ref 3.5–5.1)
Sodium: 135 mmol/L (ref 135–145)

## 2021-01-14 LAB — PROTIME-INR
INR: 1 (ref 0.8–1.2)
Prothrombin Time: 13 seconds (ref 11.4–15.2)

## 2021-01-14 MED ORDER — TRANEXAMIC ACID FOR EPISTAXIS
500.0000 mg | Freq: Once | TOPICAL | Status: AC
  Administered 2021-01-14: 500 mg via TOPICAL
  Filled 2021-01-14: qty 10

## 2021-01-14 NOTE — ED Notes (Signed)
Bleeding controlled. Wound wrapped

## 2021-01-14 NOTE — ED Notes (Signed)
Pt has a 3.5cm x 2.5cm excision below the base of left ear. Pt came in stating that when he changed the bandage per MDs directions with Vaseline gauze the wound started bleeding. Pt and family was unable to get the wound to stop.  Upon assessment pt's site is still bleeding. TXA applied and wound re-dressed

## 2021-01-14 NOTE — Discharge Instructions (Addendum)
Do not change the dressing until you see Dr. Nevada Crane on Thursday.  If bleeding does recur hold pressure for 30 minutes without peeking.  Return to the ED with worsening bleeding, chest pain, shortness of breath, dizziness, lightheadedness, any other concerns.

## 2021-01-14 NOTE — ED Provider Notes (Signed)
[facility] EMERGENCY DEPARTMENT Provider Note   CSN: [id number] Arrival date & time: 01/13/21  2306     History Chief Complaint  Patient presents with   Bleeding  behind ear    Maurice Munoz is a 75 y.o. male.  Patient states he had a skin biopsy done by Dr. Nevada Crane on July 25.  Bleeding started tonight when he change the bandage per MDs directions.  Family unable to get the wound to stop bleeding at home.  He does take aspirin and Plavix. These medications were not held for the procedure.  He denies any difficulty breathing or chest pain.  No abdominal pain, nausea or vomiting.  No dizziness or lightheadedness.  The history is provided by the patient.      Past Medical History:  Diagnosis Date   Adrenal adenoma, left    small   Aortic atherosclerosis (HCC)    Back pain    Bilateral carotid artery stenosis followed by dr Claiborne Billings   per last carotid duplex 0000000  -- proximal LICA 0000000 ,  RICA [id number]   CAD (coronary artery disease)    a. s/p prior intervention to RCA and PDA in 1999 b. DES to LAD and distal RCA in 2007   Cardiomegaly    Stable   Chronic constipation    Chronic pain syndrome    CKD (chronic kidney disease), stage III (HCC)    DDD (degenerative disc disease), cervical    DDD (degenerative disc disease), lumbosacral    Dyspnea    Dyspnea on exertion    Gout    per pt last inflared episode 2015 approx.   Grade II diastolic dysfunction    H/O epistaxis    per pt sees ENT dr Benjamine Mola for cauterization (pt takes plavix)   History of acute pancreatitis 08/08/2014   History of kidney stones    HTN (hypertension)    Hyperlipidemia    Interstitial lung disease (HCC)    Left arm pain    s/p fall   Nocturia    Numbness of right foot    due to DDD lumbar   Peripheral edema    Pilonidal cyst    Pleural effusion on right    Pulmonary fibrosis ([place])    followed by pcp-- last chest CT 04-08-2016   S/P drug eluting coronary stent placement 10/11/2005   mLAD  and dRCA   Wears dentures    upper   Wears glasses     Patient Active Problem List   Diagnosis Date Noted   Chronic respiratory failure with hypoxia ([place]) 12/17/2019   Therapeutic drug monitoring 12/17/2019   NSTEMI (non-ST elevated myocardial infarction) ([place]) 04/13/2018   CKD (chronic kidney disease) stage 3, GFR 30-59 ml/min (HCC) 04/12/2018   IPF (idiopathic pulmonary fibrosis) ([place]) 04/12/2018   Essential hypertension    CAD in native artery 04/11/2018   Arthralgia of right temporomandibular joint 02/03/2016   Hyperlipidemia LDL goal <70 09/25/2014   Lower extremity edema 09/25/2014   Nausea    Upper abdominal pain    Pancreatitis-Idiopathy acute 2.18.16 admitted [facility] 08/08/2014   Gout 08/08/2014   CAD s/p stenting 2007, prior LAD h/o 199, last myoview 3/15 Low risk 11/27/2012   Edema 11/27/2012   Obesity (BMI 30-39.9) 11/27/2012   Cervical disc disease s/p diskecktomy 2003  11/27/2012   GOUT 10/13/2009   KNEE PAIN 09/08/2009   PATELLAR TENDINITIS 09/08/2009    Past Surgical History:  Procedure Laterality Date   ANTERIOR CERVICAL DECOMP/DISCECTOMY FUSION  12-25-2001     dr Orinda Kenner [facility]   C3 -- C7   ANTERIOR CERVICAL DECOMP/DISCECTOMY FUSION  12-11-2012   dr Carloyn Manner  [facility])   CARDIOVASCULAR STRESS TEST  08-23-2012    dr Claiborne Billings   normal lexiscan nuclear study w/ no ischemia/  normal LV function and wall motion, ef 65%   CARDIOVASCULAR STRESS TEST     CORONARY ANGIOPLASTY  1999   PTCA to RCA & PDA   CORONARY ANGIOPLASTY WITH STENT PLACEMENT  10-11-2005  dr Shelva Majestic   PCI and stenting to [place] (Cypher DES x2)   CORONARY BALLOON ANGIOPLASTY N/A 04/14/2018   Procedure: CORONARY BALLOON ANGIOPLASTY;  Surgeon: Troy Sine, MD;  Location: [place] [facility];  Service: Cardiovascular;  Laterality: N/A;   CORONARY STENT INTERVENTION N/A 04/14/2018   Procedure: CORONARY STENT INTERVENTION;  Surgeon: Troy Sine, MD;  Location: [place] [facility];   Service: Cardiovascular;  Laterality: N/A;   CYSTOSCOPY W/ URETERAL STENT PLACEMENT Left 02-11-2010     [facility]   LEFT HEART CATH N/A 04/14/2018   Procedure: Left Heart Cath;  Surgeon: Troy Sine, MD;  Location: [place] [facility];  Service: Cardiovascular;  Laterality: N/A;   LEFT HEART CATH AND CORONARY ANGIOGRAPHY N/A 04/12/2018   Procedure: LEFT HEART CATH AND CORONARY ANGIOGRAPHY;  Surgeon: Jettie Booze, MD;  Location: [place] [facility];  Service: Cardiovascular;  Laterality: N/A;   LUMBAR LAMINECTOMY  2015   L5 -- S1   PILONIDAL CYST EXCISION  1980s   PILONIDAL CYST EXCISION N/A 03/31/2017   Procedure: EXCISION OF PILONIDAL DISEASE with Flap Rotation;  Surgeon: Leighton Ruff, MD;  Location: [place];  Service: General;  Laterality: N/A;   [place]   per pt "removal piece of cryloid(?) that was pressing against throat"  states no issues since   TRANSTHORACIC ECHOCARDIOGRAM  08-24-2010  dr Claiborne Billings   ef 50-55%/  mild MR/  trivial TR   WOUND DEBRIDEMENT N/A 10/06/2017   Procedure: DEBRIDEMENT WOUND PLACEMENT OF WOUND HEALING MATRIX;  Surgeon: Leighton Ruff, MD;  Location: [place];  Service: General;  Laterality: N/A;       Family History  Problem Relation Age of Onset   Cancer Father    Heart attack Brother    Diabetes Brother    Colon cancer Neg Hx     Social History   Tobacco Use   Smoking status: Former    Packs/day: 3.00    Years: 25.00    Pack years: 75.00    Types: Cigarettes    Quit date: 11/27/1988    Years since quitting: 32.1   Smokeless tobacco: Former    Quit date: 03/28/1989  Vaping Use   Vaping Use: Never used  Substance Use Topics   Alcohol use: No   Drug use: No    Home Medications Prior to Admission medications   Medication Sig Start Date End Date Taking? Authorizing Provider  allopurinol (ZYLOPRIM) 300 MG tablet Take 300 mg by mouth every morning.  11/06/12   [provider]   amLODipine (NORVASC) 5 MG tablet TAKE ONE TABLET ('5MG'$  TOTAL) BY MOUTH DAILY 11/06/19   Troy Sine, MD  aspirin EC 81 MG tablet Take 81 mg by mouth daily.    [provider]  atorvastatin (LIPITOR) 80 MG tablet TAKE ONE TABLET ('80MG'$  TOTAL) BY MOUTH AT6PM 12/06/19   Troy Sine, MD  b complex  vitamins tablet Take 1 tablet by mouth daily.    [provider]  benzonatate (TESSALON) 200 MG capsule TAKE ONE CAPSULE ('200MG'$  TOTAL) BY MOUTH TWO TIMES DAILY AS NEEDED FOR COUGH 07/23/20   Mannam, Praveen, MD  cholecalciferol (VITAMIN D) 1000 units tablet Take 5,000 Units by mouth every morning.     [provider]  clopidogrel (PLAVIX) 75 MG tablet TAKE ONE (1) TABLET BY MOUTH EVERY DAY 09/15/20   Troy Sine, MD  isosorbide mononitrate (IMDUR) 60 MG 24 hr tablet TAKE ONE TABLET ('60MG'$  TOTAL) BY MOUTH DAILY 01/06/21   Troy Sine, MD  metoprolol tartrate (LOPRESSOR) 50 MG tablet Take 1.5 tablet (75 mg) in the AM, and 1 tablet (50 mg) in the PM. Patient taking differently: 50 mg 2 (two) times daily. 08/19/20   Troy Sine, MD  nitroGLYCERIN (NITROSTAT) 0.4 MG SL tablet Place 1 tablet (0.4 mg total) under the tongue every 5 (five) minutes as needed for chest pain. 08/03/17 02/20/20  Troy Sine, MD  oxyCODONE-acetaminophen (PERCOCET) 10-325 MG per tablet Take 1 tablet by mouth every 8 (eight) hours as needed for pain.  10/11/12   [provider]  Pirfenidone (ESBRIET) 801 MG TABS Take 1 tablet by mouth 3 (three) times daily with meals. 05/08/20   Mannam, Hart Robinsons, MD  prochlorperazine (COMPAZINE) 10 MG tablet TAKE ONE TABLET ('10MG'$  TOTAL) BY MOUTH EVERY SIX HOURS AS NEEDED FOR NAUSEA OR VOMITING 01/07/21   Mannam, Praveen, MD  tiZANidine (ZANAFLEX) 4 MG tablet Take 4 mg by mouth at bedtime.  11/08/12   [provider]  torsemide (DEMADEX) 20 MG tablet Take 1 tablet (20 mg total) by mouth daily. IF ADDITIONAL SWELLING, MAY TAKE AN ADDITIONAL DOSE IN THE  AFTERNOON 11/16/19   Troy Sine, MD    Allergies    Ibuprofen, Neosporin [neomycin-bacitracin zn-polymyx], and Penicillins  Review of Systems   Review of Systems  Constitutional:  Negative for activity change, appetite change and fever.  HENT:  Negative for congestion.   Respiratory:  Negative for cough, chest tightness and shortness of breath.   Cardiovascular:  Negative for chest pain.  Gastrointestinal:  Negative for abdominal pain, nausea and vomiting.  Genitourinary:  Negative for dysuria and hematuria.  Musculoskeletal:  Negative for arthralgias and myalgias.  Skin:  Positive for wound.  Neurological:  Negative for dizziness, weakness, light-headedness and headaches.   all other systems are negative except as noted in the HPI and PMH.   Physical Exam Updated Vital Signs BP 128/77 (BP Location: Right Arm)   Pulse 77   Temp 97.7 F (36.5 C) (Oral)   Resp 18   Ht 6' (1.829 m)   Wt 105.2 kg   SpO2 94%   BMI 31.46 kg/m   Physical Exam Vitals and nursing note reviewed.  Constitutional:      General: He is not in acute distress.    Appearance: He is well-developed.  HENT:     Head: Normocephalic.     Comments: 3 cm circular area behind left ear that is oozing blood.  Not pulsatile.    Mouth/Throat:     Pharynx: No oropharyngeal exudate.  Eyes:     Conjunctiva/sclera: Conjunctivae normal.     Pupils: Pupils are equal, round, and reactive to light.  Neck:     Comments: No meningismus. Cardiovascular:     Rate and Rhythm: Normal rate and regular rhythm.     Heart sounds: Normal heart sounds. No murmur heard.  Pulmonary:     Effort: Pulmonary effort is normal. No respiratory distress.     Breath sounds: Normal breath sounds.  Abdominal:     Palpations: Abdomen is soft.     Tenderness: There is no abdominal tenderness. There is no guarding or rebound.  Musculoskeletal:        General: No tenderness. Normal range of motion.     Cervical back: Normal range of  motion and neck supple.  Skin:    General: Skin is warm.  Neurological:     Mental Status: He is alert and oriented to person, place, and time.     Cranial Nerves: No cranial nerve deficit.     Motor: No abnormal muscle tone.     Coordination: Coordination normal.     Comments:  5/5 strength throughout. CN 2-12 intact.Equal grip strength.   Psychiatric:        Behavior: Behavior normal.    ED Results / Procedures / Treatments   Labs (all labs ordered are listed, but only abnormal results are displayed) Labs Reviewed  CBC WITH DIFFERENTIAL/PLATELET - Abnormal; Notable for the following components:      Result Value   WBC 13.9 (*)    Neutro Abs 10.9 (*)    Monocytes Absolute 1.1 (*)    All other components within normal limits  BASIC METABOLIC PANEL - Abnormal; Notable for the following components:   Glucose, Bld 113 (*)    All other components within normal limits  PROTIME-INR    EKG None  Radiology No results found.  Procedures Procedures   Medications Ordered in ED Medications  tranexamic acid (CYKLOKAPRON) 1000 MG/10ML topical solution 500 mg (500 mg Topical Given 01/14/21 0146)    ED Course  I have reviewed the triage vital signs and the nursing notes.  Pertinent labs & imaging results that were available during my care of the patient were reviewed by me and considered in my medical decision making (see chart for details).    MDM Rules/Calculators/A&P                           Bleeding wound from skin biopsy site.  Vitals are stable.  Patient given topical TXA as well as silver nitrate and pressure held.  Hemoglobin Stable.  Bleeding has resolved after TXA, silver nitrate and pressure.  Patient has follow-up with Dr. Nevada Crane in 2 days.  Instructed not to change the dressing until then.  Bleeding recurs hold pressure with single finger for 30 minutes. Return precautions discussed.   Final Clinical Impression(s) / ED Diagnoses Final diagnoses:  Bleeding  from wound    Rx / DC Orders ED Discharge Orders     None        Shizuo Biskup, Annie Main, MD 01/14/21 1020

## 2021-01-18 DIAGNOSIS — I2510 Atherosclerotic heart disease of native coronary artery without angina pectoris: Secondary | ICD-10-CM | POA: Diagnosis not present

## 2021-01-18 DIAGNOSIS — I129 Hypertensive chronic kidney disease with stage 1 through stage 4 chronic kidney disease, or unspecified chronic kidney disease: Secondary | ICD-10-CM | POA: Diagnosis not present

## 2021-01-18 DIAGNOSIS — E782 Mixed hyperlipidemia: Secondary | ICD-10-CM | POA: Diagnosis not present

## 2021-01-18 DIAGNOSIS — N1831 Chronic kidney disease, stage 3a: Secondary | ICD-10-CM | POA: Diagnosis not present

## 2021-01-26 DIAGNOSIS — G894 Chronic pain syndrome: Secondary | ICD-10-CM | POA: Diagnosis not present

## 2021-01-26 DIAGNOSIS — E6609 Other obesity due to excess calories: Secondary | ICD-10-CM | POA: Diagnosis not present

## 2021-01-26 DIAGNOSIS — Z6832 Body mass index (BMI) 32.0-32.9, adult: Secondary | ICD-10-CM | POA: Diagnosis not present

## 2021-01-27 ENCOUNTER — Telehealth: Payer: Self-pay | Admitting: Cardiovascular Disease

## 2021-01-27 NOTE — Telephone Encounter (Signed)
Pt c/o medication issue:  1. Name of Medication: keflex 500 mg   2. How are you currently taking this medication (dosage and times per day)? Patient has not started taking the medication yet   3. Are you having a reaction (difficulty breathing--STAT)?   4. What is your medication issue? Patient wanted to know if this antibiotic would interact with the medications he is currently on.  The patient had a patch of skin cancer removed, and his dermatologist wants him to start taking the antibiotic.

## 2021-01-27 NOTE — Telephone Encounter (Signed)
Yes, ok to take Keflex

## 2021-01-27 NOTE — Telephone Encounter (Signed)
Spoke with the patient and advised him that our pharmacist reviewed his medications and it is okay for him to take Keflex. Patient verbalized understanding.

## 2021-02-02 ENCOUNTER — Other Ambulatory Visit: Payer: Self-pay | Admitting: Cardiovascular Disease

## 2021-02-03 ENCOUNTER — Other Ambulatory Visit: Payer: Self-pay | Admitting: Cardiovascular Disease

## 2021-02-09 DIAGNOSIS — L928 Other granulomatous disorders of the skin and subcutaneous tissue: Secondary | ICD-10-CM | POA: Diagnosis not present

## 2021-02-16 ENCOUNTER — Other Ambulatory Visit: Payer: Self-pay | Admitting: Cardiovascular Disease

## 2021-02-16 DIAGNOSIS — S70361A Insect bite (nonvenomous), right thigh, initial encounter: Secondary | ICD-10-CM | POA: Diagnosis not present

## 2021-02-16 DIAGNOSIS — L928 Other granulomatous disorders of the skin and subcutaneous tissue: Secondary | ICD-10-CM | POA: Diagnosis not present

## 2021-02-16 DIAGNOSIS — Z85828 Personal history of other malignant neoplasm of skin: Secondary | ICD-10-CM | POA: Diagnosis not present

## 2021-02-16 DIAGNOSIS — Z08 Encounter for follow-up examination after completed treatment for malignant neoplasm: Secondary | ICD-10-CM | POA: Diagnosis not present

## 2021-02-24 DIAGNOSIS — G894 Chronic pain syndrome: Secondary | ICD-10-CM | POA: Diagnosis not present

## 2021-02-24 DIAGNOSIS — J449 Chronic obstructive pulmonary disease, unspecified: Secondary | ICD-10-CM | POA: Diagnosis not present

## 2021-02-24 DIAGNOSIS — E6609 Other obesity due to excess calories: Secondary | ICD-10-CM | POA: Diagnosis not present

## 2021-02-24 DIAGNOSIS — J8410 Pulmonary fibrosis, unspecified: Secondary | ICD-10-CM | POA: Diagnosis not present

## 2021-02-24 DIAGNOSIS — Z6832 Body mass index (BMI) 32.0-32.9, adult: Secondary | ICD-10-CM | POA: Diagnosis not present

## 2021-02-26 ENCOUNTER — Emergency Department (HOSPITAL_COMMUNITY)
Admission: EM | Admit: 2021-02-26 | Discharge: 2021-02-26 | Disposition: A | Discharge status: Home / Self Care | Payer: Medicare Other | Attending: Emergency Medicine | Admitting: Emergency Medicine

## 2021-02-26 ENCOUNTER — Encounter (HOSPITAL_COMMUNITY): Payer: Self-pay

## 2021-02-26 ENCOUNTER — Emergency Department (HOSPITAL_COMMUNITY): Payer: Medicare Other

## 2021-02-26 DIAGNOSIS — N183 Chronic kidney disease, stage 3 unspecified: Secondary | ICD-10-CM | POA: Diagnosis not present

## 2021-02-26 DIAGNOSIS — Y92000 Kitchen of unspecified non-institutional (private) residence as  the place of occurrence of the external cause: Secondary | ICD-10-CM | POA: Diagnosis not present

## 2021-02-26 DIAGNOSIS — S51812A Laceration without foreign body of left forearm, initial encounter: Secondary | ICD-10-CM | POA: Diagnosis not present

## 2021-02-26 DIAGNOSIS — I2510 Atherosclerotic heart disease of native coronary artery without angina pectoris: Secondary | ICD-10-CM | POA: Insufficient documentation

## 2021-02-26 DIAGNOSIS — I10 Essential (primary) hypertension: Secondary | ICD-10-CM | POA: Diagnosis not present

## 2021-02-26 DIAGNOSIS — M7989 Other specified soft tissue disorders: Secondary | ICD-10-CM | POA: Diagnosis not present

## 2021-02-26 DIAGNOSIS — Y9301 Activity, walking, marching and hiking: Secondary | ICD-10-CM | POA: Insufficient documentation

## 2021-02-26 DIAGNOSIS — Z79899 Other long term (current) drug therapy: Secondary | ICD-10-CM | POA: Diagnosis not present

## 2021-02-26 DIAGNOSIS — W010XXA Fall on same level from slipping, tripping and stumbling without subsequent striking against object, initial encounter: Secondary | ICD-10-CM | POA: Diagnosis not present

## 2021-02-26 DIAGNOSIS — Z7982 Long term (current) use of aspirin: Secondary | ICD-10-CM | POA: Diagnosis not present

## 2021-02-26 DIAGNOSIS — Z87891 Personal history of nicotine dependence: Secondary | ICD-10-CM | POA: Diagnosis not present

## 2021-02-26 DIAGNOSIS — S51811A Laceration without foreign body of right forearm, initial encounter: Secondary | ICD-10-CM

## 2021-02-26 DIAGNOSIS — I5030 Unspecified diastolic (congestive) heart failure: Secondary | ICD-10-CM | POA: Diagnosis not present

## 2021-02-26 DIAGNOSIS — S8001XA Contusion of right knee, initial encounter: Secondary | ICD-10-CM | POA: Diagnosis not present

## 2021-02-26 DIAGNOSIS — I130 Hypertensive heart and chronic kidney disease with heart failure and stage 1 through stage 4 chronic kidney disease, or unspecified chronic kidney disease: Secondary | ICD-10-CM | POA: Diagnosis not present

## 2021-02-26 DIAGNOSIS — S20212A Contusion of left front wall of thorax, initial encounter: Secondary | ICD-10-CM | POA: Insufficient documentation

## 2021-02-26 DIAGNOSIS — J849 Interstitial pulmonary disease, unspecified: Secondary | ICD-10-CM | POA: Diagnosis not present

## 2021-02-26 DIAGNOSIS — E882 Lipomatosis, not elsewhere classified: Secondary | ICD-10-CM | POA: Diagnosis not present

## 2021-02-26 DIAGNOSIS — S59912A Unspecified injury of left forearm, initial encounter: Secondary | ICD-10-CM | POA: Diagnosis present

## 2021-02-26 DIAGNOSIS — Z7902 Long term (current) use of antithrombotics/antiplatelets: Secondary | ICD-10-CM | POA: Insufficient documentation

## 2021-02-26 DIAGNOSIS — I700 Atherosclerosis of aorta: Secondary | ICD-10-CM | POA: Diagnosis not present

## 2021-02-26 DIAGNOSIS — R109 Unspecified abdominal pain: Secondary | ICD-10-CM | POA: Diagnosis not present

## 2021-02-26 DIAGNOSIS — W19XXXA Unspecified fall, initial encounter: Secondary | ICD-10-CM

## 2021-02-26 LAB — BASIC METABOLIC PANEL
Anion gap: 6 (ref 5–15)
BUN: 19 mg/dL (ref 8–23)
CO2: 29 mmol/L (ref 22–32)
Calcium: 8.9 mg/dL (ref 8.9–10.3)
Chloride: 103 mmol/L (ref 98–111)
Creatinine, Ser: 1.22 mg/dL (ref 0.61–1.24)
GFR, Estimated: >60 mL/min (ref >60)
Glucose, Bld: 93 mg/dL (ref 70–99)
Potassium: 4 mmol/L (ref 3.5–5.1)
Sodium: 138 mmol/L (ref 135–145)

## 2021-02-26 LAB — URINALYSIS, ROUTINE W REFLEX MICROSCOPIC
Bilirubin Urine: NEGATIVE
Glucose, UA: NEGATIVE mg/dL
Hgb urine dipstick: NEGATIVE
Ketones, ur: NEGATIVE mg/dL
Leukocytes,Ua: NEGATIVE
Nitrite: NEGATIVE
Protein, ur: 30 mg/dL — AB
Specific Gravity, Urine: 1.02 (ref 1.005–1.030)
pH: 6 (ref 5.0–8.0)

## 2021-02-26 LAB — CBC WITH DIFFERENTIAL/PLATELET
Abs Immature Granulocytes: 0.06 10*3/uL (ref 0.00–0.07)
Basophils Absolute: 0.1 10*3/uL (ref 0.0–0.1)
Basophils Relative: 1 %
Eosinophils Absolute: 0.1 10*3/uL (ref 0.0–0.5)
Eosinophils Relative: 2 %
HCT: 49.4 % (ref 39.0–52.0)
Hemoglobin: 16.1 g/dL (ref 13.0–17.0)
Immature Granulocytes: 1 %
Lymphocytes Relative: 14 %
Lymphs Abs: 1.3 10*3/uL (ref 0.7–4.0)
MCH: 29.9 pg (ref 26.0–34.0)
MCHC: 32.6 g/dL (ref 30.0–36.0)
MCV: 91.8 fL (ref 80.0–100.0)
Monocytes Absolute: 0.8 10*3/uL (ref 0.1–1.0)
Monocytes Relative: 8 %
Neutro Abs: 7.1 10*3/uL (ref 1.7–7.7)
Neutrophils Relative %: 74 %
Platelets: 219 10*3/uL (ref 150–400)
RBC: 5.38 MIL/uL (ref 4.22–5.81)
RDW: 14.6 % (ref 11.5–15.5)
WBC: 9.5 10*3/uL (ref 4.0–10.5)
nRBC: 0 % (ref 0.0–0.2)

## 2021-02-26 LAB — URINALYSIS, MICROSCOPIC (REFLEX): RBC / HPF: NONE SEEN RBC/hpf (ref 0–5)

## 2021-02-26 LAB — PROTIME-INR
INR: 1 (ref 0.8–1.2)
Prothrombin Time: 12.8 seconds (ref 11.4–15.2)

## 2021-02-26 MED ORDER — FENTANYL CITRATE PF 50 MCG/ML IJ SOSY
50.0000 ug | PREFILLED_SYRINGE | Freq: Once | INTRAMUSCULAR | Status: AC
  Administered 2021-02-26: 50 ug via INTRAVENOUS
  Filled 2021-02-26: qty 1

## 2021-02-26 NOTE — ED Notes (Signed)
ED Provider at bedside. 

## 2021-02-26 NOTE — Discharge Instructions (Signed)
You are seen in the emergency department for evaluation of injuries from a fall.  You had x-rays of your chest and right knee that did not show any acute findings.  These areas are likely bruised.  You also had a skin tear on your right forearm.  Please apply ice to your affected areas and use Tylenol as needed for pain.  Follow-up with your primary care doctor and orthopedics.  Return to the emergency department if any worsening or concerning symptoms

## 2021-02-26 NOTE — ED Provider Notes (Signed)
[facility] EMERGENCY DEPARTMENT Provider Note   CSN: [id number] Arrival date & time: 02/26/21  1641     History Chief Complaint  Patient presents with   Maurice Munoz is a 75 y.o. male.  He is here for evaluation of injuries from a fall.  He said he was walking in the kitchen when he slipped on the floor and fell into the refrigerator striking his left flank along with landing on his right knee.  He called a friend for help.  No loss of consciousness.  He was able to ambulate to the stretcher.  He denies any head or neck pain.  He has some left flank pain worse with bending and twisting.  Skin tear to his right forearm and pain in his right knee.  No numbness or weakness.  He is on Plavix.  The history is provided by the patient.  Fall This is a new problem. The current episode started 1 to 2 hours ago. The problem has not changed since onset.Associated symptoms include chest pain and abdominal pain. Pertinent negatives include no headaches and no shortness of breath. The symptoms are aggravated by bending and twisting. Nothing relieves the symptoms. He has tried rest for the symptoms. The treatment provided no relief.      Past Medical History:  Diagnosis Date   Adrenal adenoma, left    small   Aortic atherosclerosis (HCC)    Back pain    Bilateral carotid artery stenosis followed by dr Claiborne Billings   per last carotid duplex 0000000  -- proximal LICA 0000000 ,  RICA [id number]   CAD (coronary artery disease)    a. s/p prior intervention to RCA and PDA in 1999 b. DES to LAD and distal RCA in 2007   Cardiomegaly    Stable   Chronic constipation    Chronic pain syndrome    CKD (chronic kidney disease), stage III (HCC)    DDD (degenerative disc disease), cervical    DDD (degenerative disc disease), lumbosacral    Dyspnea    Dyspnea on exertion    Gout    per pt last inflared episode 2015 approx.   Grade II diastolic dysfunction    H/O epistaxis    per pt sees ENT dr Benjamine Mola  for cauterization (pt takes plavix)   History of acute pancreatitis 08/08/2014   History of kidney stones    HTN (hypertension)    Hyperlipidemia    Interstitial lung disease (HCC)    Left arm pain    s/p fall   Nocturia    Numbness of right foot    due to DDD lumbar   Peripheral edema    Pilonidal cyst    Pleural effusion on right    Pulmonary fibrosis ([place])    followed by pcp-- last chest CT 04-08-2016   S/P drug eluting coronary stent placement 10/11/2005   mLAD and dRCA   Wears dentures    upper   Wears glasses     Patient Active Problem List   Diagnosis Date Noted   Chronic respiratory failure with hypoxia ([place]) 12/17/2019   Therapeutic drug monitoring 12/17/2019   NSTEMI (non-ST elevated myocardial infarction) ([place]) 04/13/2018   CKD (chronic kidney disease) stage 3, GFR 30-59 ml/min (HCC) 04/12/2018   IPF (idiopathic pulmonary fibrosis) ([place]) 04/12/2018   Essential hypertension    CAD in native artery 04/11/2018   Arthralgia of right temporomandibular joint 02/03/2016   Hyperlipidemia LDL goal <70 09/25/2014  Lower extremity edema 09/25/2014   Nausea    Upper abdominal pain    Pancreatitis-Idiopathy acute 2.18.16 admitted [facility] 08/08/2014   Gout 08/08/2014   CAD s/p stenting 2007, prior LAD h/o 199, last myoview 3/15 Low risk 11/27/2012   Edema 11/27/2012   Obesity (BMI 30-39.9) 11/27/2012   Cervical disc disease s/p diskecktomy 2003  11/27/2012   GOUT 10/13/2009   KNEE PAIN 09/08/2009   PATELLAR TENDINITIS 09/08/2009    Past Surgical History:  Procedure Laterality Date   ANTERIOR CERVICAL DECOMP/DISCECTOMY FUSION  12-25-2001     dr Orinda Kenner [facility]   C3 -- C7   ANTERIOR CERVICAL DECOMP/DISCECTOMY FUSION  12-11-2012   dr Carloyn Manner  [facility])   CARDIOVASCULAR STRESS TEST  08-23-2012    dr Claiborne Billings   normal lexiscan nuclear study w/ no ischemia/  normal LV function and wall motion, ef 65%   CARDIOVASCULAR STRESS TEST     CORONARY ANGIOPLASTY  1999   PTCA to  RCA & PDA   CORONARY ANGIOPLASTY WITH STENT PLACEMENT  10-11-2005  dr Shelva Majestic   PCI and stenting to [place] (Cypher DES x2)   CORONARY BALLOON ANGIOPLASTY N/A 04/14/2018   Procedure: CORONARY BALLOON ANGIOPLASTY;  Surgeon: Troy Sine, MD;  Location: [place] CV LAB;  Service: Cardiovascular;  Laterality: N/A;   CORONARY STENT INTERVENTION N/A 04/14/2018   Procedure: CORONARY STENT INTERVENTION;  Surgeon: Troy Sine, MD;  Location: [place] CV LAB;  Service: Cardiovascular;  Laterality: N/A;   CYSTOSCOPY W/ URETERAL STENT PLACEMENT Left 02-11-2010     [facility]   LEFT HEART CATH N/A 04/14/2018   Procedure: Left Heart Cath;  Surgeon: Troy Sine, MD;  Location: [place] CV LAB;  Service: Cardiovascular;  Laterality: N/A;   LEFT HEART CATH AND CORONARY ANGIOGRAPHY N/A 04/12/2018   Procedure: LEFT HEART CATH AND CORONARY ANGIOGRAPHY;  Surgeon: Jettie Booze, MD;  Location: [place] CV LAB;  Service: Cardiovascular;  Laterality: N/A;   LUMBAR LAMINECTOMY  2015   L5 -- S1   PILONIDAL CYST EXCISION  1980s   PILONIDAL CYST EXCISION N/A 03/31/2017   Procedure: EXCISION OF PILONIDAL DISEASE with Flap Rotation;  Surgeon: Leighton Ruff, MD;  Location: [place];  Service: General;  Laterality: N/A;   [place]   per pt "removal piece of cryloid(?) that was pressing against throat"  states no issues since   TRANSTHORACIC ECHOCARDIOGRAM  08-24-2010  dr Claiborne Billings   ef 50-55%/  mild MR/  trivial TR   WOUND DEBRIDEMENT N/A 10/06/2017   Procedure: DEBRIDEMENT WOUND PLACEMENT OF WOUND HEALING MATRIX;  Surgeon: Leighton Ruff, MD;  Location: [place];  Service: General;  Laterality: N/A;       Family History  Problem Relation Age of Onset   Cancer Father    Heart attack Brother    Diabetes Brother    Colon cancer Neg Hx     Social History   Tobacco Use   Smoking status: Former    Packs/day: 3.00    Years: 25.00     Pack years: 75.00    Types: Cigarettes    Quit date: 11/27/1988    Years since quitting: 32.2   Smokeless tobacco: Former    Quit date: 03/28/1989  Vaping Use   Vaping Use: Never used  Substance Use Topics   Alcohol use: No   Drug use: No    Home Medications Prior to Admission medications  Medication Sig Start Date End Date Taking? Authorizing Provider  allopurinol (ZYLOPRIM) 300 MG tablet Take 300 mg by mouth every morning.  11/06/12   [provider]  amLODipine (NORVASC) 5 MG tablet TAKE ONE TABLET ('5MG'$  TOTAL) BY MOUTH DAILY. 02/03/21   Troy Sine, MD  aspirin EC 81 MG tablet Take 81 mg by mouth daily.    [provider]  atorvastatin (LIPITOR) 80 MG tablet TAKE ONE TABLET ('80MG'$  TOTAL) BY MOUTH AT6PM 02/03/21   Troy Sine, MD  b complex vitamins tablet Take 1 tablet by mouth daily.    [provider]  benzonatate (TESSALON) 200 MG capsule TAKE ONE CAPSULE ('200MG'$  TOTAL) BY MOUTH TWO TIMES DAILY AS NEEDED FOR COUGH 07/23/20   Mannam, Praveen, MD  cholecalciferol (VITAMIN D) 1000 units tablet Take 5,000 Units by mouth every morning.     [provider]  clopidogrel (PLAVIX) 75 MG tablet TAKE ONE (1) TABLET BY MOUTH EVERY DAY 09/15/20   Troy Sine, MD  isosorbide mononitrate (IMDUR) 60 MG 24 hr tablet TAKE ONE TABLET ('60MG'$  TOTAL) BY MOUTH DAILY 01/06/21   Troy Sine, MD  metoprolol tartrate (LOPRESSOR) 50 MG tablet Take 1.5 tablet (75 mg) in the AM, and 1 tablet (50 mg) in the PM. Patient taking differently: 50 mg 2 (two) times daily. 08/19/20   Troy Sine, MD  nitroGLYCERIN (NITROSTAT) 0.4 MG SL tablet Place 1 tablet (0.4 mg total) under the tongue every 5 (five) minutes as needed for chest pain. 08/03/17 02/20/20  Troy Sine, MD  oxyCODONE-acetaminophen (PERCOCET) 10-325 MG per tablet Take 1 tablet by mouth every 8 (eight) hours as needed for pain.  10/11/12   [provider]  Pirfenidone (ESBRIET) 801 MG TABS Take 1 tablet by  mouth 3 (three) times daily with meals. 05/08/20   Mannam, Hart Robinsons, MD  prochlorperazine (COMPAZINE) 10 MG tablet TAKE ONE TABLET ('10MG'$  TOTAL) BY MOUTH EVERY SIX HOURS AS NEEDED FOR NAUSEA OR VOMITING 01/07/21   Mannam, Praveen, MD  tiZANidine (ZANAFLEX) 4 MG tablet Take 4 mg by mouth at bedtime.  11/08/12   [provider]  torsemide (DEMADEX) 20 MG tablet TAKE ONE TABLET ('20MG'$  TOTAL) BY MOUTH DAILY. IF ADDITIONAL SWELLING, MAY TAKE AN ADDITIONAL DOSE IN THE AFTERNOON 02/16/21   Troy Sine, MD    Allergies    Ibuprofen, Neosporin [neomycin-bacitracin zn-polymyx], and Penicillins  Review of Systems   Review of Systems  Constitutional:  Negative for fever.  HENT:  Negative for sore throat.   Eyes:  Negative for visual disturbance.  Respiratory:  Negative for shortness of breath.   Cardiovascular:  Positive for chest pain.  Gastrointestinal:  Positive for abdominal pain. Negative for nausea and vomiting.  Genitourinary:  Negative for dysuria.  Musculoskeletal:  Positive for back pain. Negative for neck pain.  Skin:  Positive for wound. Negative for rash.  Neurological:  Negative for headaches.   Physical Exam Updated Vital Signs BP (!) 148/92   Pulse 64   Temp 97.8 F (36.6 C) (Oral)   Resp 19   Ht 6' (1.829 m)   Wt 104.3 kg   SpO2 96%   BMI 31.19 kg/m   Physical Exam Vitals and nursing note reviewed.  Constitutional:      Appearance: Normal appearance. He is well-developed.  HENT:     Head: Normocephalic and atraumatic.  Eyes:     Conjunctiva/sclera: Conjunctivae normal.  Cardiovascular:     Rate and Rhythm: Normal  rate and regular rhythm.     Heart sounds: No murmur heard. Pulmonary:     Effort: Pulmonary effort is normal. No respiratory distress.     Breath sounds: Normal breath sounds.  Chest:     Chest wall: Tenderness present.     Comments: He has some tenderness to his left lateral chest and into his left flank.  No crepitus. Abdominal:      Palpations: Abdomen is soft.     Tenderness: There is no abdominal tenderness. There is no guarding.  Musculoskeletal:        General: Tenderness and signs of injury present. Normal range of motion.     Cervical back: Neck supple.     Comments: He has approximately 6 cm skin tear to his proximal right forearm.  Full range of motion without any bony tenderness.  He has diffuse tenderness and swelling to his right knee.  Extensor mechanism intact.  No ligamentous laxity.  Hip and ankle nontender.  No open wounds.  Left Upper and lower extremity nontender without any pain or limitations  Skin:    General: Skin is warm and dry.     Capillary Refill: Capillary refill takes less than 2 seconds.  Neurological:     General: No focal deficit present.     Mental Status: He is alert and oriented to person, place, and time.     Sensory: No sensory deficit.     Motor: No weakness.    ED Results / Procedures / Treatments   Labs (all labs ordered are listed, but only abnormal results are displayed) Labs Reviewed  URINALYSIS, ROUTINE W REFLEX MICROSCOPIC - Abnormal; Notable for the following components:      Result Value   Protein, ur 30 (*)    All other components within normal limits  URINALYSIS, MICROSCOPIC (REFLEX) - Abnormal; Notable for the following components:   Bacteria, UA RARE (*)    All other components within normal limits  BASIC METABOLIC PANEL  CBC WITH DIFFERENTIAL/PLATELET  PROTIME-INR    EKG None  Radiology DG Ribs Unilateral W/Chest Left  Result Date: 02/26/2021 CLINICAL DATA:  Pain, fall. Slip on wet floor. Left posterior rib pain. EXAM: LEFT RIBS AND CHEST - 3+ VIEW COMPARISON:  Chest radiograph 12/10/2020, CT 04/24/2019 FINDINGS: No fracture or other bone lesions are seen involving the ribs. A BB was placed at site of maximal pain overlying the lower ribs, no evidence of fractures subjacent to the BB. There is no evidence of pneumothorax or pleural effusion. Extensive  chronic interstitial lung disease. Stable heart size, accentuated by mediastinal lipomatosis. Unchanged mediastinal contours with aortic atherosclerosis IMPRESSION: 1. No evidence of left rib fracture or acute chest finding. 2. Extensive chronic interstitial lung disease. Electronically Signed   By: Keith Rake M.D.   On: 02/26/2021 18:20   DG Knee Complete 4 Views Right  Result Date: 02/26/2021 CLINICAL DATA:  Pain, fall. Slipped on wet floor. Right knee swelling. EXAM: RIGHT KNEE - COMPLETE 4+ VIEW COMPARISON:  Knee radiograph 05/11/2014 FINDINGS: No evidence of fracture, dislocation, or joint effusion. Bones are diffusely under mineralized. Prominent patellar tendon enthesopathic change at both the patellar and tibial insertions. Smaller quadriceps tendon enthesophyte. Similar findings were seen on prior exam. There is mild medial and anterior soft tissue edema. Soft tissues are unremarkable. IMPRESSION: 1. No fracture or subluxation of the right knee. 2. Soft tissue edema. 3. Prominent patellar tendon enthesopathic change, chronic. Electronically Signed   By: Aurther Loft.D.  On: 02/26/2021 18:21    Procedures Procedures   Medications Ordered in ED Medications  fentaNYL (SUBLIMAZE) injection 50 mcg (has no administration in time range)    ED Course  I have reviewed the triage vital signs and the nursing notes.  Pertinent labs & imaging results that were available during my care of the patient were reviewed by me and considered in my medical decision making (see chart for details).  Clinical Course as of 02/27/21 0945  Thu Feb 26, 2021  1836 Patient status pain is improved.  He has been able to ambulate a little bit.  His x-rays of his chest and knee show some chronic changes but no acute changes.  We will try him with a knee sleeve and see if he is appropriate for discharge. [MB]    Clinical Course User Index [MB] Hayden Rasmussen, MD   MDM Rules/Calculators/A&P                           This patient complains of left chest wall pain and right knee pain after a fall; this involves an extensive number of treatment Options and is a complaint that carries with it a high risk of complications and Morbidity. The differential includes fracture, contusion, pneumothorax, ligamentous injury  I ordered, reviewed and interpreted labs, which included CBC with normal white count normal hemoglobin, urinalysis without any hematuria I ordered medication IV pain medication with improvement in his symptoms I ordered imaging studies which included chest x-ray and left rib series, right knee x-ray and I independently    visualized and interpreted imaging which showed no acute traumatic findings.  Patient has significant bony growth throughout his patellar mechanism Previous records obtained and reviewed in epic no recent admissions  After the interventions stated above, I reevaluated the patient and found patient felt clinically improved.  He is applied ice to affected areas with improvement in his pain.  He has been ambulatory in the department.  Recommended close follow-up with his primary care doctor.  Return instructions discussed   Final Clinical Impression(s) / ED Diagnoses Final diagnoses:  Fall, initial encounter  Contusion of left chest wall, initial encounter  Skin tear of right forearm without complication, initial encounter  Contusion of right knee, initial encounter    Rx / DC Orders ED Discharge Orders     None        Hayden Rasmussen, MD 02/27/21 [phone]

## 2021-02-26 NOTE — ED Notes (Signed)
Pt transported to XR.  

## 2021-02-26 NOTE — ED Notes (Signed)
Pt. Ambulatory at discharge.

## 2021-02-26 NOTE — ED Triage Notes (Addendum)
Pt. Arrived via [facility]. Pt. Slipped on their wet floor. Pt. Has a swollen right knee and a skin tear to their right arm. Pt. States they hit their back on the refrigerator. Pt. States they hit their head on the floor. Pt. Is on a blood thinner.

## 2021-03-19 DIAGNOSIS — Z08 Encounter for follow-up examination after completed treatment for malignant neoplasm: Secondary | ICD-10-CM | POA: Diagnosis not present

## 2021-03-19 DIAGNOSIS — Z85828 Personal history of other malignant neoplasm of skin: Secondary | ICD-10-CM | POA: Diagnosis not present

## 2021-03-26 DIAGNOSIS — Z6831 Body mass index (BMI) 31.0-31.9, adult: Secondary | ICD-10-CM | POA: Diagnosis not present

## 2021-03-26 DIAGNOSIS — M1991 Primary osteoarthritis, unspecified site: Secondary | ICD-10-CM | POA: Diagnosis not present

## 2021-03-26 DIAGNOSIS — G894 Chronic pain syndrome: Secondary | ICD-10-CM | POA: Diagnosis not present

## 2021-03-26 DIAGNOSIS — Z23 Encounter for immunization: Secondary | ICD-10-CM | POA: Diagnosis not present

## 2021-03-26 DIAGNOSIS — M5137 Other intervertebral disc degeneration, lumbosacral region: Secondary | ICD-10-CM | POA: Diagnosis not present

## 2021-04-10 ENCOUNTER — Ambulatory Visit: Payer: Medicare Other | Admitting: Cardiovascular Disease

## 2021-04-17 DIAGNOSIS — I5032 Chronic diastolic (congestive) heart failure: Secondary | ICD-10-CM | POA: Diagnosis not present

## 2021-04-17 DIAGNOSIS — I129 Hypertensive chronic kidney disease with stage 1 through stage 4 chronic kidney disease, or unspecified chronic kidney disease: Secondary | ICD-10-CM | POA: Diagnosis not present

## 2021-04-17 DIAGNOSIS — N182 Chronic kidney disease, stage 2 (mild): Secondary | ICD-10-CM | POA: Diagnosis not present

## 2021-04-17 DIAGNOSIS — R809 Proteinuria, unspecified: Secondary | ICD-10-CM | POA: Diagnosis not present

## 2021-04-17 DIAGNOSIS — E6609 Other obesity due to excess calories: Secondary | ICD-10-CM | POA: Diagnosis not present

## 2021-04-27 DIAGNOSIS — G894 Chronic pain syndrome: Secondary | ICD-10-CM | POA: Diagnosis not present

## 2021-04-27 DIAGNOSIS — E6609 Other obesity due to excess calories: Secondary | ICD-10-CM | POA: Diagnosis not present

## 2021-04-27 DIAGNOSIS — M5137 Other intervertebral disc degeneration, lumbosacral region: Secondary | ICD-10-CM | POA: Diagnosis not present

## 2021-04-27 DIAGNOSIS — Z6831 Body mass index (BMI) 31.0-31.9, adult: Secondary | ICD-10-CM | POA: Diagnosis not present

## 2021-05-05 ENCOUNTER — Other Ambulatory Visit: Payer: Self-pay | Admitting: Cardiovascular Disease

## 2021-05-06 DIAGNOSIS — Z20822 Contact with and (suspected) exposure to covid-19: Secondary | ICD-10-CM | POA: Diagnosis not present

## 2021-05-07 DIAGNOSIS — Z08 Encounter for follow-up examination after completed treatment for malignant neoplasm: Secondary | ICD-10-CM | POA: Diagnosis not present

## 2021-05-07 DIAGNOSIS — X32XXXD Exposure to sunlight, subsequent encounter: Secondary | ICD-10-CM | POA: Diagnosis not present

## 2021-05-07 DIAGNOSIS — Z85828 Personal history of other malignant neoplasm of skin: Secondary | ICD-10-CM | POA: Diagnosis not present

## 2021-05-07 DIAGNOSIS — L570 Actinic keratosis: Secondary | ICD-10-CM | POA: Diagnosis not present

## 2021-05-08 DIAGNOSIS — G894 Chronic pain syndrome: Secondary | ICD-10-CM | POA: Diagnosis not present

## 2021-05-08 DIAGNOSIS — Z6831 Body mass index (BMI) 31.0-31.9, adult: Secondary | ICD-10-CM | POA: Diagnosis not present

## 2021-05-08 DIAGNOSIS — N200 Calculus of kidney: Secondary | ICD-10-CM | POA: Diagnosis not present

## 2021-05-08 DIAGNOSIS — E6609 Other obesity due to excess calories: Secondary | ICD-10-CM | POA: Diagnosis not present

## 2021-05-14 DIAGNOSIS — Z20828 Contact with and (suspected) exposure to other viral communicable diseases: Secondary | ICD-10-CM | POA: Diagnosis not present

## 2021-05-18 ENCOUNTER — Ambulatory Visit: Payer: Medicare Other | Admitting: Pulmonary Disease

## 2021-05-19 ENCOUNTER — Other Ambulatory Visit: Payer: Self-pay

## 2021-05-19 ENCOUNTER — Encounter: Payer: Self-pay | Admitting: Cardiovascular Disease

## 2021-05-19 ENCOUNTER — Ambulatory Visit (INDEPENDENT_AMBULATORY_CARE_PROVIDER_SITE_OTHER): Payer: Medicare Other | Admitting: Cardiovascular Disease

## 2021-05-19 DIAGNOSIS — E785 Hyperlipidemia, unspecified: Secondary | ICD-10-CM

## 2021-05-19 DIAGNOSIS — I2510 Atherosclerotic heart disease of native coronary artery without angina pectoris: Secondary | ICD-10-CM

## 2021-05-19 DIAGNOSIS — I10 Essential (primary) hypertension: Secondary | ICD-10-CM | POA: Diagnosis not present

## 2021-05-19 DIAGNOSIS — I25118 Atherosclerotic heart disease of native coronary artery with other forms of angina pectoris: Secondary | ICD-10-CM

## 2021-05-19 DIAGNOSIS — R42 Dizziness and giddiness: Secondary | ICD-10-CM

## 2021-05-19 DIAGNOSIS — J8410 Pulmonary fibrosis, unspecified: Secondary | ICD-10-CM | POA: Diagnosis not present

## 2021-05-19 MED ORDER — LISINOPRIL 2.5 MG PO TABS: 2.5000 mg | ORAL_TABLET | Freq: Every day | ORAL | 3 refills | Status: DC

## 2021-05-19 NOTE — Patient Instructions (Signed)
Medication Instructions:  Decrease Lisinopril to 2.5 mg daily. If systolic (top number) stays less than 100, please contact our office.   *If you need a refill on your cardiac medications before your next appointment, please call your pharmacy*   Lab Work: None ordered today   Testing/Procedures: None ordered today    Follow-Up: At [facility], you and your health needs are our priority.  As part of our continuing mission to provide you with exceptional heart care, we have created designated Provider Care Teams.  These Care Teams include your primary Cardiologist (physician) and Advanced Practice Providers (APPs -  Physician Assistants and Nurse Practitioners) who all work together to provide you with the care you need, when you need it.  We recommend signing up for the patient portal called "MyChart".  Sign up information is provided on this After Visit Summary.  MyChart is used to connect with patients for Virtual Visits (Telemedicine).  Patients are able to view lab/test results, encounter notes, upcoming appointments, etc.  Non-urgent messages can be sent to your provider as well.   To learn more about what you can do with MyChart, go to [email or web address].    Your next appointment:   4 month(s)  The format for your next appointment:   In Person  Provider:   Shelva Majestic, MD

## 2021-05-19 NOTE — Progress Notes (Signed)
Patient ID: Maurice Munoz, male   DOB: 02/20/1946, 75 y.o.   MRN: [id number]      HPI: Maurice Munoz, is a 75 y.o. male who presents to the office today for a 9 month cardiology followup evaluation.     Maurice Munoz has established CAD dating back to 1999 when he underwent intervention to his RCA and PDA. In April 2007 he underwent stenting to his LAD and distal RCA at which time Cypher DES stents were inserted. He has a history of hypertension, obesity, hyperlipidemia, gout, and  lower extremity edema.   A two-year followup nuclear perfusion study in March 2014 was essentially unchanged from his prior study and continued to show fairly normal perfusion and was low risk, without evidence for scar. There was no significant ischemia.  In 2015 he had an injury to his right leg.  He had noted some intermittent leg swelling.  He had carotids Doppler study in January 2016 which showed 50-60% left proximal ICA diameter reduction, which had slightly increased from his prior study one year previously.  Because of a history of coughing up blood with pleuritic chest pain he underwent a CT [place] of his chest on March 21 which was negative for PE but showed diffuse peripheral lung densities throughout both upper and lower zones without honeycombing raising the Maurice Munoz concern for mild fibrotic changes but the pattern was nonspecific.    Over the last several years he has lost over 40 pounds from his peak weight.  He  was seen in the emergency room with lightheadedness and was hypotensive.  He was told to discontinue his isosorbide as well as lisinopril.   In the setting of his hypotension he was found to have an increased creatinine at 1.4 recently had been 1.1. He has been followed by Maurice Munoz in [place] for his renal issues.  Over the past year, he has experienced occasional episodes of chest pain which he describes as tightness.  Typically, this can occur with exertion.  At times he also notices a  chest burning.  He admits to shortness of breath.  He has been evaluated by Maurice Munoz for pulmonary fibrosis.  He has an extensive occupational history infrequently had worked in Lobbyist, Engineer, materials, and chrome plating for many years and was often exposed to significant dust inhalation.  He also had worked in Industrial/product designer.  There is remote tobacco history of 2-3 packs per day for 20-25 years, but unfortunately he quit in 1980s.  He is felt most likely to have interstitial lung disease and continues further evaluation.  He has had difficulty with his low back.  He has been on lisinopril 5 mg, metoprolol 50 mg twice a day.  He continues to take simvastatin 20 mg for hyperlipidemia.  He is been maintained on aspirin and Plavix.  There is remote history of gout, which is controlled with allopurinol.  When I saw him on February 2019, he had noticed episodes of chest burning as well as chest tightness and continues to experience exertional dyspnea.  I initiated amlodipine at 5 mg.  I strongly recommended support stockings for his potential edema.  He was given a prescription for sublabel nitroglycerin.  He underwent a follow-up [place] study of 08/12/2017 which was low risk and without ischemia.  EF was 57%.  There was minimal apical thinning most likely contributed by his body habitus.  He underwent an echo Doppler study on 10/14/2017 which showed an EF of 60-65% and grade 2  diastolic dysfunction.  There was moderate pulmonary hypertension with PA pressure at 46 mm.    At evaluation in March 2019 he was feeling better.  He denied recurrent chest pain or shortness of breath.  He is lost 21 pounds in 3 weeks purposefully.   He developed recurrent chest pain symptomatology and developed unstable angina symptoms leading to hospitalization with a non-ST segment elevation MI.  On April 12, 2018 he underwent diagnostic catheterization by Dr. Irish Lack due to multivessel CAD he underwent surgical consultation for  consideration of CABG surgery but was turned down.  As result, on April 14, 2018, I performed successful multi lesion/multivessel PCI to the RCA and LAD vessels.  He underwent Cutting Balloon PTCA to the mid PDA vessel of the RCA with a 95% stenosis being reduced to 15% and Cutting Balloon PTCA DES stenting with a 2.5 x12 mm Resolute stent inserted into the distal RCA immediately proximal to the takeoff of the PDA vessel with stent overlap to the previously placed stent with a 99% stenosis reduced to 0%.  He also underwent 3 lesion intervention to the LAD with PTCA of the 90% apical LAD stenosis, PTCA DES stenting at 2 additional study sites in the LAD successfully.  He still had residual concomitant 80% AV groove circumflex and circumflex marginal disease which was treated medically.  Maurice Munoz has noted significant improvement following his multilesion multivessel intervention to his RCA and LAD.  He has been seen in the office since by Maurice Sims, NP as well as Maurice Deforest, PA.  When last seen by Maurice Munoz, he reduced his amlodipine since his blood pressure was low and he also had 2+ pitting edema for which he recommended restarting his previous torsemide daily.  I saw him in November 2020.  He underwent an echo Doppler study 24 2020 which showed normal systolic with EF at 60 to 65%, mild LVH diastolic parameters were indeterminate.  He had moderately elevated pulmonary artery systolic pressure at 42 mm.    He was  evaluated by me on Nov 16, 2019.  He had done well since his last evaluation but over the past 2 weeks has again begun to notice some recurrent episodes of chest pain and shortness of breath.  He also has been diagnosed with pulmonary fibrosis.  He continues to experience mild leg swelling.  During that evaluation I recommended initiating isosorbide mononitrate 30 mg.  He had been on low-dose amlodipine at 2.5 mg and I recommended further titration of 5 mg.  He continues to be on metoprolol  tartrate 25 mg twice a day and remained on DAPT with aspirin/Plavix.   He was to follow-up with Maurice Munoz for his pulmonary fibrosis.  He has been started on Esbriet for his pulmonary fibrosis.  He has noticed leg swelling bilaterally left greater than right.  Approximately 2 weeks ago he took sublingual nitroglycerin with improvement of his mild chest pain.  During that evaluation, he was on amlodipine 5 mg and I recommended further titration of isosorbide to 60 mg daily and that he continue metoprolol tartrate 50 mg twice a day.  He had 2+ bilateral leg swelling and I recommended torsemide 20 mg daily and support stockings.  I saw him in September 2021 and over the prior several months he had remained fairly stable and denied recurrent chest pain on his increased medical regimen.  He continues to be on supplemental oxygen for his pulmonary fibrosis.  He was continuing to experience some leg swelling  right greater than left but had not been using support stockings.  During that evaluation I again recommended he use support stockings.  When I saw him on August 19, 2020 he was continuing to experience leg swelling and was taking torsemide 2 times per week.  He has noticed an increased heart rate with activity.  He has been on amlodipine 5 mg, isosorbide 60 mg, metoprolol 50 mg twice a day in addition to torsemide.  He continues to be on DAPT with aspirin/Plavix.  He is tolerating atorvastatin 80 mg.  Laboratory was checked at [facility] in July 2021 and lipids were excellent.  Recently he was sleeping in a recliner.  He has not had recent laboratory.  During that evaluation, he denied recurrent anginal symptoms but he noticed very quick increase in his heart rate.  I suggested slight titration of metoprolol to tartrate from 50 mg twice a day to 75 mg in the morning and 50 mg at night.  He had 2+ lower extremity edema and I suggested he take torsemide daily for the next several days and then perhaps  every other day.  He continued to be followed by Maurice Munoz for his interstitial fibrosis and was on Esbriet.  Since I last saw him, he has continued to see Maurice Munoz.  He also sees Maurice Munoz for nephrology in [place].  Unfortunately, his wife died in 12/22/2022 from metastatic cancer.  He now lives by himself.  He had recently been started on lisinopril at 2.5 mg by Maurice Munoz and at his last evaluation this was increased to 5 mg.  He has noticed fatigability and some lightheadedness since this medication was increased.  He denies chest pain or palpitations.  He presents for evaluation.  Past Medical History:  Diagnosis Date   Adrenal adenoma, left    small   Aortic atherosclerosis (HCC)    Back pain    Bilateral carotid artery stenosis followed by dr Claiborne Billings   per last carotid duplex [id number]  -- proximal LICA 38-88% ,  RICA 2-80%   CAD (coronary artery disease)    a. s/p prior intervention to RCA and PDA in 1999 b. DES to LAD and distal RCA in 2007   Cardiomegaly    Stable   Chronic constipation    Chronic pain syndrome    CKD (chronic kidney disease), stage III (HCC)    DDD (degenerative disc disease), cervical    DDD (degenerative disc disease), lumbosacral    Dyspnea    Dyspnea on exertion    Gout    per pt last inflared episode 2015 approx.   Grade II diastolic dysfunction    H/O epistaxis    per pt sees ENT dr Benjamine Mola for cauterization (pt takes plavix)   History of acute pancreatitis 08/08/2014   History of kidney stones    HTN (hypertension)    Hyperlipidemia    Interstitial lung disease (HCC)    Left arm pain    s/p fall   Nocturia    Numbness of right foot    due to DDD lumbar   Peripheral edema    Pilonidal cyst    Pleural effusion on right    Pulmonary fibrosis ([place])    followed by pcp-- last chest CT 04-08-2016   S/P drug eluting coronary stent placement 10/11/2005   mLAD and dRCA   Wears dentures    upper   Wears glasses     Past Surgical History:   Procedure Laterality Date  ANTERIOR CERVICAL DECOMP/DISCECTOMY FUSION  12-25-2001     dr Orinda Kenner [facility]   C3 -- C7   ANTERIOR CERVICAL DECOMP/DISCECTOMY FUSION  12-11-2012   dr Carloyn Manner  [facility])   CARDIOVASCULAR STRESS TEST  08-23-2012    dr Claiborne Billings   normal lexiscan nuclear study w/ no ischemia/  normal LV function and wall motion, ef 65%   CARDIOVASCULAR STRESS TEST     CORONARY ANGIOPLASTY  1999   PTCA to RCA & PDA   CORONARY ANGIOPLASTY WITH STENT PLACEMENT  10-11-2005  dr Shelva Majestic   PCI and stenting to [place] (Cypher DES x2)   CORONARY BALLOON ANGIOPLASTY N/A 04/14/2018   Procedure: CORONARY BALLOON ANGIOPLASTY;  Surgeon: Troy Sine, MD;  Location: [facility];  Service: Cardiovascular;  Laterality: N/A;   CORONARY STENT INTERVENTION N/A 04/14/2018   Procedure: CORONARY STENT INTERVENTION;  Surgeon: Troy Sine, MD;  Location: [facility];  Service: Cardiovascular;  Laterality: N/A;   CYSTOSCOPY W/ URETERAL STENT PLACEMENT Left 02-11-2010     [facility]   LEFT HEART CATH N/A 04/14/2018   Procedure: Left Heart Cath;  Surgeon: Troy Sine, MD;  Location: [facility];  Service: Cardiovascular;  Laterality: N/A;   LEFT HEART CATH AND CORONARY ANGIOGRAPHY N/A 04/12/2018   Procedure: LEFT HEART CATH AND CORONARY ANGIOGRAPHY;  Surgeon: Jettie Booze, MD;  Location: [facility];  Service: Cardiovascular;  Laterality: N/A;   LUMBAR LAMINECTOMY  2015   L5 -- S1   PILONIDAL CYST EXCISION  1980s   PILONIDAL CYST EXCISION N/A 03/31/2017   Procedure: EXCISION OF PILONIDAL DISEASE with Flap Rotation;  Surgeon: Leighton Ruff, MD;  Location: [facility];  Service: General;  Laterality: N/A;   [place]   per pt "removal piece of cryloid(?) that was pressing against throat"  states no issues since   TRANSTHORACIC ECHOCARDIOGRAM  08-24-2010  dr Claiborne Billings   ef 50-55%/  mild MR/  trivial TR   WOUND DEBRIDEMENT N/A  10/06/2017   Procedure: DEBRIDEMENT WOUND PLACEMENT OF WOUND HEALING MATRIX;  Surgeon: Leighton Ruff, MD;  Location: [facility];  Service: General;  Laterality: N/A;    Allergies  Allergen Reactions   Ibuprofen Itching    Motrin brand   Neosporin [Neomycin-Bacitracin Zn-Polymyx] Swelling   Penicillins Rash     Has patient had a PCN reaction causing immediate rash, facial/tongue/throat swelling, SOB or lightheadedness with hypotension: No Has patient had a PCN reaction causing severe rash involving mucus membranes or skin necrosis: No Has patient had a PCN reaction that required hospitalization: No Has patient had a PCN reaction occurring within the last 10 years: No If all of the above answers are "NO", then may proceed with Cephalosporin use.     Current Outpatient Medications  Medication Sig Dispense Refill   acetaminophen (TYLENOL) 500 MG tablet Take 1,000 mg by mouth every 6 (six) hours as needed.     allopurinol (ZYLOPRIM) 300 MG tablet Take 300 mg by mouth every morning.      amLODipine (NORVASC) 5 MG tablet TAKE ONE TABLET (5MG TOTAL) BY MOUTH DAILY. (Patient taking differently: Take 5 mg by mouth daily.) 90 tablet 3   aspirin EC 81 MG tablet Take 81 mg by mouth daily.     atorvastatin (LIPITOR) 80 MG tablet TAKE ONE TABLET (80MG TOTAL) BY MOUTH AT6PM (Patient taking differently: Take 80 mg by mouth daily.) 90 tablet 3  b complex vitamins tablet Take 1 tablet by mouth daily.     benzonatate (TESSALON) 200 MG capsule TAKE ONE CAPSULE (200MG TOTAL) BY MOUTH TWO TIMES DAILY AS NEEDED FOR COUGH (Patient taking differently: Take 200 mg by mouth 2 (two) times daily as needed for cough.) 60 capsule 2   cholecalciferol (VITAMIN D) 1000 units tablet Take 5,000 Units by mouth every morning.      clopidogrel (PLAVIX) 75 MG tablet TAKE ONE (1) TABLET BY MOUTH EVERY DAY 90 tablet 2   isosorbide mononitrate (IMDUR) 60 MG 24 hr tablet TAKE ONE TABLET (60MG TOTAL) BY MOUTH DAILY  (Patient taking differently: Take 60 mg by mouth daily.) 90 tablet 3   metoprolol tartrate (LOPRESSOR) 50 MG tablet TAKE 1.5 TABLET (75MG) IN THE MORNING AND 1 TABLET (50MG) IN THE EVNING 180 tablet 2   naproxen sodium (ALEVE) 220 MG tablet Take 440 mg by mouth.     nitroGLYCERIN (NITROSTAT) 0.4 MG SL tablet Place 1 tablet (0.4 mg total) under the tongue every 5 (five) minutes as needed for chest pain. 25 tablet 3   oxyCODONE-acetaminophen (PERCOCET) 10-325 MG per tablet Take 1 tablet by mouth every 8 (eight) hours as needed for pain.      prochlorperazine (COMPAZINE) 10 MG tablet TAKE ONE TABLET (10MG TOTAL) BY MOUTH EVERY SIX HOURS AS NEEDED FOR NAUSEA OR VOMITING (Patient taking differently: Take 10 mg by mouth every 6 (six) hours as needed for nausea or vomiting.) 30 tablet 1   tiZANidine (ZANAFLEX) 4 MG tablet Take 4 mg by mouth at bedtime.      torsemide (DEMADEX) 20 MG tablet TAKE ONE TABLET (20MG TOTAL) BY MOUTH DAILY. IF ADDITIONAL SWELLING, MAY TAKE AN ADDITIONAL DOSE IN THE AFTERNOON (Patient taking differently: Take 20 mg by mouth daily. May take an additional dose the afternoon) 180 tablet 1   lisinopril (ZESTRIL) 2.5 MG tablet Take 1 tablet (2.5 mg total) by mouth daily. 90 tablet 3   Pirfenidone (ESBRIET) 801 MG TABS Take 1 tablet by mouth 3 (three) times daily with meals. 90 tablet 11   No current facility-administered medications for this visit.    Socially he is married. There are no children. Has a remote tobacco history having quit approximately 16 years ago. He's not very active. He does not routinely exercise.  ROS General: Negative; No fevers, chills, or night sweats;  HEENT: Negative; No changes in vision or hearing, sinus congestion, difficulty swallowing Pulmonary: Positive for pulmonary fibrosis Cardiovascular: See history of present illness GI: Negative; No nausea, vomiting, diarrhea, or abdominal pain GU: Negative; No dysuria, hematuria, or difficulty  voiding Musculoskeletal: Negative; no myalgias, joint pain, or weakness Hematologic/Oncology: Negative; no easy bruising, bleeding Endocrine: Negative; no heat/cold intolerance; no diabetes Neuro: Negative; no changes in balance, headaches Skin: Negative; No rashes or skin lesions Psychiatric: Negative; No behavioral problems, depression Sleep: Negative; No snoring, daytime sleepiness, hypersomnolence, bruxism, restless legs, hypnogognic hallucinations, no cataplexy Other comprehensive 14 point system review is negative.   PE BP (!) 88/57   Pulse 71   Ht 6' (1.829 m)   Wt 215 lb 6.4 oz (97.7 kg)   SpO2 94%   BMI 29.21 kg/m    Repeat blood pressure by me was 90/60.  Wt Readings from Last 3 Encounters:  05/21/21 223 lb (101.2 kg)  05/19/21 215 lb 6.4 oz (97.7 kg)  02/26/21 230 lb (104.3 kg)   General: Alert, oriented, no distress.  Skin: normal turgor, no rashes, warm and dry  HEENT: Normocephalic, atraumatic. Pupils equal round and reactive to light; sclera anicteric; extraocular muscles intact;  Nose without nasal septal hypertrophy Mouth/Parynx benign; Mallinpatti scale 3 Neck: No JVD, no carotid bruits; normal carotid upstroke Lungs: clear to ausculatation and percussion; no wheezing or rales Chest wall: without tenderness to palpitation Heart: PMI not displaced, RRR, s1 s2 normal, 1/6 systolic murmur, no diastolic murmur, no rubs, gallops, thrills, or heaves Abdomen: soft, nontender; no hepatosplenomehaly, BS+; abdominal aorta nontender and not dilated by palpation. Back: no CVA tenderness Pulses 2+ Musculoskeletal: full range of motion, normal strength, no joint deformities Extremities: Prior lower extremity edema improved no clubbing cyanosis, Homan's sign negative  Neurologic: grossly nonfocal; Cranial nerves grossly wnl Psychologic: Normal mood and affect   November 29, 2022ECG (independently read by me):  NSR at 53, no ectopy  August 19, 2020 ECG (independently  read by me): Sinus rhythm at 67, sinus arrhythmia with low atrial atrial ectopy    September 2021 ECG (independently read by me): NSR 62; no STT changes; normal intervals  May 2021 ECG (independently read by me): NSR at 66; no ST changes; normal intervals  November 2020 ECG (independently read by me): Normal sinus rhythm at 75 bpm.  No ectopy.  Normal intervals  March 2019 ECG (independently read by me): Bradycardia 57 bpm.  No ectopy.  Normal intervals.  February 2018 ECG (independently read by me): Sinus bradycardia 57 bpm.  No ectopy.  Normal intervals.  January 2018 ECG (independently read by me): Sinus rhythm at 60 bpm.  No ectopy.  Normal intervals.  June 2017 ECG (independently read by me): Normal sinus rhythm at 61 bpm.  No ectopy.  Normal intervals.  April 2016 ECG (independently read by me): Normal sinus rhythm at 67 bpm.  No ectopy.  Normal intervals.  October 2014 ECG: Normal sinus rhythm at 63 beats per minute. No significant ST abnormalities. Normal intervals.  LABS:  BMP Latest Ref Rng & Units 05/21/2021 02/26/2021 01/14/2021  Glucose 70 - 99 mg/dL 109(H) 93 113(H)  BUN 6 - 23 mg/dL _0 Creatinine 0.40 - 1.50 mg/dL 1.16 1.22 1.17  BUN/Creat Ratio 10 - 24 - - -  Sodium 135 - 145 mEq/L 138 138 135  Potassium 3.5 - 5.1 mEq/L 3.8 4.0 4.4  Chloride 96 - 112 mEq/L 102 103 101  CO2 19 - 32 mEq/L _1 Calcium 8.4 - 10.5 mg/dL 9.3 8.9 9.2    Hepatic Function Latest Ref Rng & Units 05/21/2021 08/25/2020 03/18/2020  Total Protein 6.0 - 8.3 g/dL 7.2 7.2 7.1  Albumin 3.5 - 5.2 g/dL 3.9 4.0 4.1  AST 0 - 37 U/L _2 ALT 0 - 53 U/L _3 Alk Phosphatase 39 - 117 U/L 95 117 98  Total Bilirubin 0.2 - 1.2 mg/dL 0.8 0.8 1.0  Bilirubin, Direct 0.0 - 0.3 mg/dL - - -   CBC Latest Ref Rng & Units 05/21/2021 02/26/2021 01/14/2021  WBC 4.0 - 10.5 K/uL 10.2 9.5 13.9(H)  Hemoglobin 13.0 - 17.0 g/dL 15.5 16.1 16.0  Hematocrit 39.0 - 52.0 % 45.8 49.4 48.8  Platelets 150.0 -  400.0 K/uL 294.0 219 231   Lab Results  Component Value Date   MCV 88.5 05/21/2021   MCV 91.8 02/26/2021   MCV 91.6 01/14/2021    Lab Results  Component Value Date   TSH 1.040 08/25/2020   Lipid Panel     Component Value Date/Time   CHOL 119  08/25/2020 0754   TRIG 142 08/25/2020 0754   HDL 44 08/25/2020 0754   CHOLHDL 2.7 08/25/2020 0754   CHOLHDL 4.4 04/13/2018 0513   VLDL 27 04/13/2018 0513   LDLCALC 50 08/25/2020 0754     RADIOLOGY: No results found.    Other studies Reviewed: [facility] 04/14/2018: Conclusion      Mid RCA lesion is 65% stenosed. Previously placed Dist RCA-1 stent (unknown type) is widely patent. Dist RCA-2 lesion is 99% stenosed. Ost RPDA to RPDA lesion is 95% stenosed. Ost 1st Mrg to 1st Mrg lesion is 80% stenosed. Prox Cx lesion is 80% stenosed. Prox LAD to Mid LAD lesion is 10% stenosed. Mid LAD to Dist LAD lesion is 80% stenosed. Dist LAD lesion is 90% stenosed. Post intervention, there is a 20% residual stenosis. Post intervention, there is a 15% residual stenosis. Post intervention, there is a 0% residual stenosis. A stent was successfully placed. Post intervention, there is a 0% residual stenosis. Post intervention, there is a 0% residual stenosis. Mid LAD lesion is 80% stenosed. A stent was successfully placed. Post intervention, there is a 0% residual stenosis. Post intervention, there is a 0% residual stenosis. A stent was successfully placed.   Successful multilesion/multivessel PCI to the RCA and LAD vessels.   Cutting Balloon/PTCA to the mid PDA vessel of the RCA with a 95% stenosis being reduced to 15%, and Cutting Balloon/PTCA/DES stenting with a 2.5 x 12 mm Resolute Onyx DES stent inserted in the distal RCA immediately proximal to the takeoff of the PDA vessel with stent overlap to the previously placed stent and postdilatation up to 2.75 mm with the 99% stenosis being reduced to 0%.   Three lesion intervention to the LAD with  PTCA of the apical 90% LAD stenosis reduced to less than 30%, PTCA/DES stenting with a 3.0 x 22 mm Resolute Onyx DES stent postdilated to 3.25 mm with the 80% stenosis being reduced to 0% in the 80% stenosis just distal to the old LAD stent treated with PTCA/DES stenting with a 3.0 x 18 mm Resolute Onyx DES stent postdilated 3.3 mm with the 80% stenosis being reduced to 0%.   LVEDP preintervention was 15 mmHg.   RECOMMENDATION: The patient will be being maintained on dual antiplatelet therapy probably indefinitely due to significant multi-vessel disease and  intervention.  Increase medical therapy for concomitant CAD.  Patient will be hydrated post procedure.  Consider discharge over the weekend on increased medical therapy.  If recurrent symptoms develop consider staged PCI to his circumflex marginal and AV groove circumflex vessels.  __________   [facility] 04/12/2018: Conclusion      The left ventricular ejection fraction is 50-55% by visual estimate. The left ventricular systolic function is normal. LV end diastolic pressure is mildly elevated. LVEDP 16 mm Hg. There is no aortic valve stenosis. Previously placed Dist RCA-1 stent (unknown type) is widely patent. Dist RCA-2 lesion is 95% stenosed. RPDA lesion is 80% stenosed. Prox Cx lesion is 80% stenosed. Ost 1st Mrg to 1st Mrg lesion is 80% stenosed. Previously placed Mid LAD-1 stent (unknown type) is widely patent. Mid LAD-2 lesion is 75% stenosed. Ost 2nd Diag to 2nd Diag lesion is 75% stenosed. Dist LAD lesion is 75% stenosed. Ost 1st Diag lesion is 50% stenosed. Prox LAD lesion is 25% stenosed.   Multivessel CAD.  Plan for cardiac surgery consult.  He does have diffuse distal LAD disease which is not optimal for LIMA to LAD.  If he is not  a candidate for CABG, would plan to bring back for PCI.          IMPRESSION:  1. Coronary artery disease involving native coronary artery of native heart with other form of angina pectoris ([place])    2. s/p percutaneous coronary interventions: 1999, 2007, 2019   3. Dizziness secondary to low blood pressure   4. Essential hypertension   5. Pulmonary fibrosis ([place])   6. Hyperlipidemia LDL goal <70      ASSESSMENT AND PLAN: Mr. Aguayo is a 75 year old Caucasian male who is 23 years since his initial intervention to his RCA and PDA and 15 years since stenting to his LAD and distal RCA.  He has a history of hypertension.   Due to his significant occupational exposure, remote tobacco, and abnormal PFTs on CT imaging he is felt most likely to have interstitial lung disease.  In 2019 he developed recurrent unstable anginal symptomatology and was found to have progressive multivessel CAD.  He was turned down for CABG revascularization.  He underwent complex multi lesion multivessel PCI by me on April 14, 2018 at several sites in his RCA and LAD.  At the time he also had concomitant circumflex and circumflex and marginal disease which was not intervened upon.  He had felt significantly improved with reference to symptoms but when evaluated in November 2020 he had recurrent chest pain and increased shortness of breath.  He had been started on Esbriet for pulmonary fibrosis.  I further titrated his anti-ischemic medication with improvement in his symptomatology.  He also has had issues with lower extremity edema and has been on furosemide.  He is followed by Dr. Vernon Prey in [place] for his kidney disease.  Creatinine on February 26, 2021 was 1.22.  Currently he tells me he was started on lisinopril 2.5 mg and this was recently titrated to 5 mg.  His blood pressure today is low and he has experienced episodic dizziness.  I have recommended he reduce his lisinopril back down to 2.5 mg and monitor his blood pressure.  He continues to be on metoprolol tartrate 75 mg in the morning and 50 mg in the evening as well as isosorbide 60 mg.  If blood pressure continues to be low isosorbide can be reduced to 30 mg as  long as he is not having any anginal symptomatology.  He continues to be on amlodipine 5 mg.  He has been on chronic DAPT with aspirin/Plavix.  His lipids are aggressively treated with atorvastatin 80 mg.  LDL cholesterol in March 2022 was 50.  He will be following up at [facility] for his primary care and he will be seen by Maurice Munoz with reference to his interstitial fibrosis.  I will see him in 4 months for reevaluation or sooner as needed.   Troy Sine, MD, [facility]  05/26/2021 4:15 PM

## 2021-05-20 ENCOUNTER — Other Ambulatory Visit: Payer: Self-pay | Admitting: *Deleted

## 2021-05-20 DIAGNOSIS — J84112 Idiopathic pulmonary fibrosis: Secondary | ICD-10-CM

## 2021-05-20 MED ORDER — PIRFENIDONE 801 MG PO TABS: 1.0000 | ORAL_TABLET | Freq: Three times a day (TID) | ORAL | 11 refills | Status: DC

## 2021-05-21 ENCOUNTER — Ambulatory Visit (INDEPENDENT_AMBULATORY_CARE_PROVIDER_SITE_OTHER): Payer: Medicare Other | Admitting: Pulmonary Disease

## 2021-05-21 ENCOUNTER — Other Ambulatory Visit: Payer: Self-pay

## 2021-05-21 ENCOUNTER — Encounter: Payer: Self-pay | Admitting: Pulmonary Disease

## 2021-05-21 VITALS — BP 122/68 | HR 78 | Temp 97.7°F | Ht 72.0 in | Wt 223.0 lb

## 2021-05-21 DIAGNOSIS — R0602 Shortness of breath: Secondary | ICD-10-CM

## 2021-05-21 DIAGNOSIS — J84112 Idiopathic pulmonary fibrosis: Secondary | ICD-10-CM

## 2021-05-21 DIAGNOSIS — Z5181 Encounter for therapeutic drug level monitoring: Secondary | ICD-10-CM | POA: Diagnosis not present

## 2021-05-21 DIAGNOSIS — I2510 Atherosclerotic heart disease of native coronary artery without angina pectoris: Secondary | ICD-10-CM | POA: Diagnosis not present

## 2021-05-21 DIAGNOSIS — I272 Pulmonary hypertension, unspecified: Secondary | ICD-10-CM | POA: Diagnosis not present

## 2021-05-21 LAB — COMPREHENSIVE METABOLIC PANEL WITH GFR
ALT: 21 U/L (ref 0–53)
AST: 26 U/L (ref 0–37)
Albumin: 3.9 g/dL (ref 3.5–5.2)
Alkaline Phosphatase: 95 U/L (ref 39–117)
BUN: 22 mg/dL (ref 6–23)
CO2: 29 meq/L (ref 19–32)
Calcium: 9.3 mg/dL (ref 8.4–10.5)
Chloride: 102 meq/L (ref 96–112)
Creatinine, Ser: 1.16 mg/dL (ref 0.40–1.50)
GFR: 62.54 mL/min
Glucose, Bld: 109 mg/dL — ABNORMAL HIGH (ref 70–99)
Potassium: 3.8 meq/L (ref 3.5–5.1)
Sodium: 138 meq/L (ref 135–145)
Total Bilirubin: 0.8 mg/dL (ref 0.2–1.2)
Total Protein: 7.2 g/dL (ref 6.0–8.3)

## 2021-05-21 LAB — CBC WITH DIFFERENTIAL/PLATELET
Basophils Absolute: 0.1 10*3/uL (ref 0.0–0.1)
Basophils Relative: 0.7 % (ref 0.0–3.0)
Eosinophils Absolute: 0.2 10*3/uL (ref 0.0–0.7)
Eosinophils Relative: 2.3 % (ref 0.0–5.0)
HCT: 45.8 % (ref 39.0–52.0)
Hemoglobin: 15.5 g/dL (ref 13.0–17.0)
Lymphocytes Relative: 12.8 % (ref 12.0–46.0)
Lymphs Abs: 1.3 10*3/uL (ref 0.7–4.0)
MCHC: 33.8 g/dL (ref 30.0–36.0)
MCV: 88.5 fl (ref 78.0–100.0)
Monocytes Absolute: 0.9 10*3/uL (ref 0.1–1.0)
Monocytes Relative: 8.3 % (ref 3.0–12.0)
Neutro Abs: 7.8 10*3/uL — ABNORMAL HIGH (ref 1.4–7.7)
Neutrophils Relative %: 75.9 % (ref 43.0–77.0)
Platelets: 294 10*3/uL (ref 150.0–400.0)
RBC: 5.18 Mil/uL (ref 4.22–5.81)
RDW: 14.6 % (ref 11.5–15.5)
WBC: 10.2 10*3/uL (ref 4.0–10.5)

## 2021-05-21 NOTE — Addendum Note (Signed)
Addended by: Elton Sin on: 05/21/2021 02:08 PM   Modules accepted: Orders

## 2021-05-21 NOTE — Progress Notes (Addendum)
Maurice Munoz    [id number]    [birth year]  Primary Care Physician:Fusco, Purcell Nails, MD  Referring Physician: [facility] [employer], [address],  [place] [address]  Chief complaint: Follow-up for Idiopathic pulmonary fibrosis [name] January 6740  HPI: 75 year old with history of coronary artery disease, pulmonary fibrosis.  Previously followed by [name].  Prior CT scan shows pulmonary fibrosis which is thought to be secondary to occupational exposure Referred back for worsening dyspnea Discussed at multidisciplinary conference on 07/03/19 with diagnosis of IPF and started on Esbriet  Patient has established coronary artery disease status post multiple stenting in 2019.  He also has residual circumflex disease that was not intervened upon.  Pets: Dog.  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  No ongoing exposures.  No mold, hot tub, Jacuzzi or down pillows or comforters Smoking history: 75-pack-year smoker.  Quit in 1990 Travel history: No significant travel history Relevant family history: Father died of lung cancer.  He was a smoker.  Interim history: Is tolerating Esbriet well without issues He recently had his blood pressure medications increased by nephrology but was feeling dizzy.  This was reduced again at recent cardiology visit  Has stable dyspnea on exertion.  Continues on supplemental oxygen.  He lost his wife recently to cancer  Outpatient Encounter Medications as of 05/21/2021  Medication Sig   acetaminophen (TYLENOL) 500 MG tablet Take 1,000 mg by mouth every 6 (six) hours as needed.   allopurinol (ZYLOPRIM) 300 MG tablet Take 300 mg by mouth every morning.    amLODipine (NORVASC) 5 MG tablet TAKE ONE TABLET (5MG  TOTAL) BY MOUTH DAILY. (Patient taking differently: Take 5 mg by mouth daily.)    aspirin EC 81 MG tablet Take 81 mg by mouth daily.   atorvastatin (LIPITOR) 80 MG tablet TAKE ONE TABLET (80MG  TOTAL) BY MOUTH AT6PM (Patient taking differently: Take 80 mg by mouth daily.)   b complex vitamins tablet Take 1 tablet by mouth daily.   benzonatate (TESSALON) 200 MG capsule TAKE ONE CAPSULE (200MG  TOTAL) BY MOUTH TWO TIMES DAILY AS NEEDED FOR COUGH (Patient taking differently: Take 200 mg by mouth 2 (two) times daily as needed for cough.)   cholecalciferol (VITAMIN D) 1000 units tablet Take 5,000 Units by mouth every morning.    clopidogrel (PLAVIX) 75 MG tablet TAKE ONE (1) TABLET BY MOUTH EVERY DAY   isosorbide mononitrate (IMDUR) 60 MG 24 hr tablet TAKE ONE TABLET (60MG  TOTAL) BY MOUTH DAILY (Patient taking differently: Take 60 mg by mouth daily.)   lisinopril (ZESTRIL) 2.5 MG tablet Take 1 tablet (2.5 mg total) by mouth daily.   metoprolol tartrate (LOPRESSOR) 50 MG tablet TAKE 1.5 TABLET (75MG ) IN THE MORNING AND 1 TABLET (50MG ) IN THE EVNING   naproxen sodium (ALEVE) 220 MG tablet Take 440 mg by mouth.   oxyCODONE-acetaminophen (PERCOCET) 10-325 MG per tablet Take 1 tablet by mouth every 8 (eight) hours as needed for pain.    Pirfenidone (ESBRIET) 801 MG TABS Take 1 tablet by mouth 3 (three) times daily with meals.   prochlorperazine (COMPAZINE) 10 MG tablet TAKE ONE TABLET (10MG  TOTAL) BY MOUTH EVERY SIX HOURS AS NEEDED FOR NAUSEA OR VOMITING (Patient taking differently: Take 10 mg by mouth every 6 (six) hours as needed for nausea or vomiting.)   tiZANidine (ZANAFLEX) 4 MG  tablet Take 4 mg by mouth at bedtime.    torsemide (DEMADEX) 20 MG tablet TAKE ONE TABLET (20MG  TOTAL) BY MOUTH DAILY. IF ADDITIONAL SWELLING, MAY TAKE AN ADDITIONAL DOSE IN THE AFTERNOON (Patient taking differently: Take 20 mg by mouth daily. May take an additional dose the afternoon)   nitroGLYCERIN (NITROSTAT) 0.4 MG SL tablet Place 1 tablet (0.4 mg total) under the tongue every 5 (five) minutes as needed for  chest pain.   No facility-administered encounter medications on file as of 05/21/2021.   Physical Exam: Blood pressure 122/68, pulse 78, temperature 97.7 F (36.5 C), temperature source Oral, height 6' (1.829 m), weight 223 lb (101.2 kg), SpO2 98 %. Gen:      No acute distress HEENT:  EOMI, sclera anicteric Neck:     No masses; no thyromegaly Lungs:    Bibasal crackles CV:         Regular rate and rhythm; no murmurs Abd:      + bowel sounds; soft, non-tender; no palpable masses, no distension Ext:    No edema; adequate peripheral perfusion Skin:      Warm and dry; no rash Neuro: alert and oriented x 3 Psych: normal mood and affect   Data Reviewed: Imaging: CT high-resolution February 05, 2015 patchy areas of groundglass attenuation, septal thickening with mild basal gradient. CT chest high-resolution 05/09/2016-subpleural reticulation, mild traction bronchiolectasis, mild honeycombing.  Definite UIP pattern high-resolution  CT chest high-resolution 04/24/2019-progression of fibrotic lung lung disease and definite UIP pattern. I have reviewed the images personally.  PFTs: 04/29/2017 FVC 2.66 [55%], FEV1 2.35 [66%], F/F 88, TLC 4.10 [55%], DLCO 15.60 [44%] Moderate restriction with severe diffusion defect.  Worsening lung function compared to 2016.  05/28/2019 FVC 2.34 [49%], FEV1 1.96 [56%], F/F 84, TLC 3.50 [47%], DLCO 12.72 [46%] Severe restriction, diffusion defect.  Worsening lung volumes and diffusion capacity compared to 2008  09/19/2020 FVC 2.18 [46%), FEV1 1.87 (54%], F/F 86, TLC 3.33 [44%], DLCO 12.05 [44%] Severe restriction, diffusion defect with worsening  Labs: CTD serologies 02/23/2017-negative ANA, Jo 1, centromere antibody, rheumatoid factor, Ro, La, SCL 70  Repeat CTD serologies-negative ANA, CCP CK, aldolase, myositis panel, SCL 70, Ro, La Rheumatoid factor 16  Hepatic panel 08/25/2020-within normal limits  Assessment:  Idiopathic pulmonary fibrosis He has  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  Although there is some occupational exposures he has not had ongoing exposure since 2000.  He does not have signs and symptoms of connective tissue disease.  CTD serologies significant for borderline rheumatoid factor which is nonsignificant  Discussed at multidisciplinary conference with a diagnosis of IPF Continue Esbriet.  Has occasional nausea and fatigue with the med Zofran as needed  Check labs for monitoring Follow-up CT and PFTs in 3 months  Pulmonary hypertension Order repeat echocardiogram.  Consider right heart cath if PA pressures continue to be elevated  Follow-up in 6 months  Plan/Recommendations: - Continue esbriet.   -Labs - CT, PFTs - Echocardiogram   Marshell Garfinkel MD [facility] 05/21/2021, 1:45 PM  CC: Redmond School, MD

## 2021-05-21 NOTE — Patient Instructions (Signed)
I am glad you are doing well with the Esbriet We will check labs for monitoring today including CMP, CBC and proBNP for dyspnea Will order an echocardiogram for evaluation of pulmonary hypertension Will need high-res CT and PFTs in 3 months Follow-up in clinic after 3 months

## 2021-05-22 LAB — PRO B NATRIURETIC PEPTIDE: NT-Pro BNP: 572 pg/mL — ABNORMAL HIGH (ref 0–376)

## 2021-05-25 DIAGNOSIS — B029 Zoster without complications: Secondary | ICD-10-CM | POA: Diagnosis not present

## 2021-05-25 DIAGNOSIS — E669 Obesity, unspecified: Secondary | ICD-10-CM | POA: Diagnosis not present

## 2021-05-25 DIAGNOSIS — G894 Chronic pain syndrome: Secondary | ICD-10-CM | POA: Diagnosis not present

## 2021-05-25 DIAGNOSIS — M1991 Primary osteoarthritis, unspecified site: Secondary | ICD-10-CM | POA: Diagnosis not present

## 2021-05-25 DIAGNOSIS — M5137 Other intervertebral disc degeneration, lumbosacral region: Secondary | ICD-10-CM | POA: Diagnosis not present

## 2021-05-25 DIAGNOSIS — L989 Disorder of the skin and subcutaneous tissue, unspecified: Secondary | ICD-10-CM | POA: Diagnosis not present

## 2021-05-25 DIAGNOSIS — Z6830 Body mass index (BMI) 30.0-30.9, adult: Secondary | ICD-10-CM | POA: Diagnosis not present

## 2021-05-26 ENCOUNTER — Encounter: Payer: Self-pay | Admitting: Cardiovascular Disease

## 2021-05-28 DIAGNOSIS — B029 Zoster without complications: Secondary | ICD-10-CM | POA: Diagnosis not present

## 2021-06-10 ENCOUNTER — Other Ambulatory Visit: Payer: Self-pay

## 2021-06-10 ENCOUNTER — Ambulatory Visit (HOSPITAL_COMMUNITY): Payer: Medicare Other | Attending: Cardiology

## 2021-06-10 DIAGNOSIS — I272 Pulmonary hypertension, unspecified: Secondary | ICD-10-CM | POA: Diagnosis not present

## 2021-06-10 LAB — ECHOCARDIOGRAM COMPLETE
Area-P 1/2: 3.67 cm2
S' Lateral: 2.8 cm

## 2021-06-11 DIAGNOSIS — Z20822 Contact with and (suspected) exposure to covid-19: Secondary | ICD-10-CM | POA: Diagnosis not present

## 2021-06-16 ENCOUNTER — Other Ambulatory Visit: Payer: Self-pay | Admitting: Cardiovascular Disease

## 2021-06-23 ENCOUNTER — Other Ambulatory Visit: Payer: Self-pay

## 2021-06-23 ENCOUNTER — Ambulatory Visit (INDEPENDENT_AMBULATORY_CARE_PROVIDER_SITE_OTHER): Payer: Medicare Other | Admitting: Pulmonary Disease

## 2021-06-23 DIAGNOSIS — J84112 Idiopathic pulmonary fibrosis: Secondary | ICD-10-CM

## 2021-06-23 LAB — PULMONARY FUNCTION TEST
DL/VA % pred: 51 %
DL/VA: 2.05 ml/min/mmHg/L
DLCO cor % pred: 21 %
DLCO cor: 5.9 ml/min/mmHg
DLCO unc % pred: 22 %
DLCO unc: 6.05 ml/min/mmHg
FEF 25-75 Post: 0.92 L/sec
FEF 25-75 Pre: 1.38 L/sec
FEF2575-%Change-Post: -33 %
FEF2575-%Pred-Post: 36 %
FEF2575-%Pred-Pre: 54 %
FEV1-%Change-Post: -10 %
FEV1-%Pred-Post: 45 %
FEV1-%Pred-Pre: 50 %
FEV1-Post: 1.54 L
FEV1-Pre: 1.73 L
FEV1FVC-%Change-Post: -8 %
FEV1FVC-%Pred-Pre: 105 %
FEV6-%Change-Post: -2 %
FEV6-%Pred-Post: 49 %
FEV6-%Pred-Pre: 51 %
FEV6-Post: 2.18 L
FEV6-Pre: 2.24 L
FEV6FVC-%Pred-Post: 106 %
FEV6FVC-%Pred-Pre: 106 %
FVC-%Change-Post: -2 %
FVC-%Pred-Post: 46 %
FVC-%Pred-Pre: 48 %
FVC-Post: 2.18 L
FVC-Pre: 2.24 L
Post FEV1/FVC ratio: 71 %
Post FEV6/FVC ratio: 100 %
Pre FEV1/FVC ratio: 77 %
Pre FEV6/FVC Ratio: 100 %
RV % pred: 62 %
RV: 1.63 L
TLC % pred: 47 %
TLC: 3.53 L

## 2021-06-23 NOTE — Progress Notes (Signed)
PFT done today. 

## 2021-06-24 ENCOUNTER — Telehealth: Payer: Self-pay | Admitting: Pulmonary Disease

## 2021-06-24 DIAGNOSIS — E669 Obesity, unspecified: Secondary | ICD-10-CM | POA: Diagnosis not present

## 2021-06-24 DIAGNOSIS — I10 Essential (primary) hypertension: Secondary | ICD-10-CM | POA: Diagnosis not present

## 2021-06-24 DIAGNOSIS — G894 Chronic pain syndrome: Secondary | ICD-10-CM | POA: Diagnosis not present

## 2021-06-24 DIAGNOSIS — Z6830 Body mass index (BMI) 30.0-30.9, adult: Secondary | ICD-10-CM | POA: Diagnosis not present

## 2021-06-24 NOTE — Telephone Encounter (Signed)
See echo result note 06/24/21.

## 2021-07-12 DIAGNOSIS — Z20822 Contact with and (suspected) exposure to covid-19: Secondary | ICD-10-CM | POA: Diagnosis not present

## 2021-07-20 DIAGNOSIS — L0501 Pilonidal cyst with abscess: Secondary | ICD-10-CM | POA: Diagnosis not present

## 2021-07-23 DIAGNOSIS — M1991 Primary osteoarthritis, unspecified site: Secondary | ICD-10-CM | POA: Diagnosis not present

## 2021-07-23 DIAGNOSIS — E669 Obesity, unspecified: Secondary | ICD-10-CM | POA: Diagnosis not present

## 2021-07-23 DIAGNOSIS — Z6830 Body mass index (BMI) 30.0-30.9, adult: Secondary | ICD-10-CM | POA: Diagnosis not present

## 2021-07-23 DIAGNOSIS — G894 Chronic pain syndrome: Secondary | ICD-10-CM | POA: Diagnosis not present

## 2021-07-23 DIAGNOSIS — M5137 Other intervertebral disc degeneration, lumbosacral region: Secondary | ICD-10-CM | POA: Diagnosis not present

## 2021-07-31 DIAGNOSIS — Z20822 Contact with and (suspected) exposure to covid-19: Secondary | ICD-10-CM | POA: Diagnosis not present

## 2021-08-19 DIAGNOSIS — R809 Proteinuria, unspecified: Secondary | ICD-10-CM | POA: Diagnosis not present

## 2021-08-19 DIAGNOSIS — Z20822 Contact with and (suspected) exposure to covid-19: Secondary | ICD-10-CM | POA: Diagnosis not present

## 2021-08-19 DIAGNOSIS — I5032 Chronic diastolic (congestive) heart failure: Secondary | ICD-10-CM | POA: Diagnosis not present

## 2021-08-19 DIAGNOSIS — I129 Hypertensive chronic kidney disease with stage 1 through stage 4 chronic kidney disease, or unspecified chronic kidney disease: Secondary | ICD-10-CM | POA: Diagnosis not present

## 2021-08-19 DIAGNOSIS — E6609 Other obesity due to excess calories: Secondary | ICD-10-CM | POA: Diagnosis not present

## 2021-08-19 DIAGNOSIS — N182 Chronic kidney disease, stage 2 (mild): Secondary | ICD-10-CM | POA: Diagnosis not present

## 2021-08-20 ENCOUNTER — Ambulatory Visit
Admission: RE | Admit: 2021-08-20 | Discharge: 2021-08-20 | Disposition: A | Discharge status: Home / Self Care | Payer: Medicare Other | Source: Ambulatory Visit | Attending: Pulmonary Disease | Admitting: Pulmonary Disease

## 2021-08-20 DIAGNOSIS — J84112 Idiopathic pulmonary fibrosis: Secondary | ICD-10-CM

## 2021-08-20 DIAGNOSIS — Z1331 Encounter for screening for depression: Secondary | ICD-10-CM | POA: Diagnosis not present

## 2021-08-20 DIAGNOSIS — E6609 Other obesity due to excess calories: Secondary | ICD-10-CM | POA: Diagnosis not present

## 2021-08-20 DIAGNOSIS — Z0001 Encounter for general adult medical examination with abnormal findings: Secondary | ICD-10-CM | POA: Diagnosis not present

## 2021-08-20 DIAGNOSIS — I2510 Atherosclerotic heart disease of native coronary artery without angina pectoris: Secondary | ICD-10-CM | POA: Diagnosis not present

## 2021-08-20 DIAGNOSIS — I7789 Other specified disorders of arteries and arterioles: Secondary | ICD-10-CM | POA: Diagnosis not present

## 2021-08-20 DIAGNOSIS — J479 Bronchiectasis, uncomplicated: Secondary | ICD-10-CM | POA: Diagnosis not present

## 2021-08-20 DIAGNOSIS — Z6830 Body mass index (BMI) 30.0-30.9, adult: Secondary | ICD-10-CM | POA: Diagnosis not present

## 2021-08-20 DIAGNOSIS — G894 Chronic pain syndrome: Secondary | ICD-10-CM | POA: Diagnosis not present

## 2021-08-20 DIAGNOSIS — M5137 Other intervertebral disc degeneration, lumbosacral region: Secondary | ICD-10-CM | POA: Diagnosis not present

## 2021-08-20 DIAGNOSIS — M1991 Primary osteoarthritis, unspecified site: Secondary | ICD-10-CM | POA: Diagnosis not present

## 2021-08-26 DIAGNOSIS — Z6829 Body mass index (BMI) 29.0-29.9, adult: Secondary | ICD-10-CM | POA: Diagnosis not present

## 2021-08-26 DIAGNOSIS — N200 Calculus of kidney: Secondary | ICD-10-CM | POA: Diagnosis not present

## 2021-08-26 DIAGNOSIS — I129 Hypertensive chronic kidney disease with stage 1 through stage 4 chronic kidney disease, or unspecified chronic kidney disease: Secondary | ICD-10-CM | POA: Diagnosis not present

## 2021-08-26 DIAGNOSIS — N182 Chronic kidney disease, stage 2 (mild): Secondary | ICD-10-CM | POA: Diagnosis not present

## 2021-08-26 DIAGNOSIS — R809 Proteinuria, unspecified: Secondary | ICD-10-CM | POA: Diagnosis not present

## 2021-08-26 DIAGNOSIS — I5032 Chronic diastolic (congestive) heart failure: Secondary | ICD-10-CM | POA: Diagnosis not present

## 2021-09-11 ENCOUNTER — Ambulatory Visit (INDEPENDENT_AMBULATORY_CARE_PROVIDER_SITE_OTHER): Payer: Medicare Other | Admitting: Cardiovascular Disease

## 2021-09-11 ENCOUNTER — Encounter: Payer: Self-pay | Admitting: Cardiovascular Disease

## 2021-09-11 ENCOUNTER — Other Ambulatory Visit: Payer: Self-pay

## 2021-09-11 DIAGNOSIS — E785 Hyperlipidemia, unspecified: Secondary | ICD-10-CM

## 2021-09-11 DIAGNOSIS — I2510 Atherosclerotic heart disease of native coronary artery without angina pectoris: Secondary | ICD-10-CM | POA: Diagnosis not present

## 2021-09-11 DIAGNOSIS — I10 Essential (primary) hypertension: Secondary | ICD-10-CM | POA: Diagnosis not present

## 2021-09-11 DIAGNOSIS — I25119 Atherosclerotic heart disease of native coronary artery with unspecified angina pectoris: Secondary | ICD-10-CM

## 2021-09-11 DIAGNOSIS — J8410 Pulmonary fibrosis, unspecified: Secondary | ICD-10-CM

## 2021-09-11 NOTE — Patient Instructions (Signed)
Medication Instructions:  °The current medical regimen is effective;  continue present plan and medications as directed. Please refer to the Current Medication list given to you today.  ° °*If you need a refill on your cardiac medications before your next appointment, please call your pharmacy* ° °Lab Work:   Testing/Procedures:  °NONE    NONE ° °Follow-Up: °Your next appointment:  6 month(s) In Person with Thomas Kelly, MD  ° °Please call our office 2 months in advance to schedule this appointment  ° °At [facility], you and your health needs are our priority.  As part of our continuing mission to provide you with exceptional heart care, we have created designated Provider Care Teams.  These Care Teams include your primary Cardiologist (physician) and Advanced Practice Providers (APPs -  Physician Assistants and Nurse Practitioners) who all work together to provide you with the care you need, when you need it. ° ° °

## 2021-09-11 NOTE — Progress Notes (Signed)
Maurice Munoz, male   DOB: 03-19-1946, 76 y.o.   MRN: [id number] ? ? ? ? ? ?HPI: Maurice Munoz, is a 76 y.o. male who presents to the office today for a 4 month cardiology followup evaluation.    ? ?[name] has established CAD dating back to 1999 when he underwent intervention to his RCA and PDA. In April 2007 he underwent stenting to his LAD and distal RCA at which time Cypher DES stents were inserted. He has a history of hypertension, obesity, hyperlipidemia, gout, and  lower extremity edema.  ? ?A two-year followup nuclear perfusion study in March 2014 was essentially unchanged from his prior study and continued to show fairly normal perfusion and was low risk, without evidence for scar. There was no significant ischemia. ? ?In 2015 he had an injury to his right leg.  He had noted some intermittent leg swelling.  He had carotids Doppler study in January 2016 which showed 50-60% left proximal ICA diameter reduction, which had slightly increased from his prior study one year previously.  Because of a history of coughing up blood with pleuritic chest pain he underwent a CT [place] of his chest on March 21 which was negative for PE but showed diffuse peripheral lung densities throughout both upper and lower zones without honeycombing raising the [name] concern for mild fibrotic changes but the pattern was nonspecific.   ? ?Over the last several years he has lost over 40 pounds from his peak weight.  He  was seen in the emergency room with lightheadedness and was hypotensive.  He was told to discontinue his isosorbide as well as lisinopril.   In the setting of his hypotension he was found to have an increased creatinine at 1.4 recently had been 1.1. He has been followed by Dr.Befakadu in [place] for his renal issues. ? ?Over the past year, he has experienced occasional episodes of chest pain which he describes as tightness.  Typically, this can occur with exertion.  At times he also notices a  chest burning.  He admits to shortness of breath.  He has been evaluated by [name] for pulmonary fibrosis.  He has an extensive occupational history infrequently had worked in Lobbyist, Engineer, materials, and chrome plating for many years and was often exposed to significant dust inhalation.  He also had worked in Industrial/product designer.  There is remote tobacco history of 2-3 packs per day for 20-25 years, but unfortunately he quit in 1980s.  He is felt most likely to have interstitial lung disease and continues further evaluation.  He has had difficulty with his low back.  He has been on lisinopril 5 mg, metoprolol 50 mg twice a day.  He continues to take simvastatin 20 mg for hyperlipidemia.  He is been maintained on aspirin and Plavix.  There is remote history of gout, which is controlled with allopurinol. ? ?When I saw him on February 2019, he had noticed episodes of chest burning as well as chest tightness and continues to experience exertional dyspnea.  I initiated amlodipine at 5 mg.  I strongly recommended support stockings for his potential edema.  He was given a prescription for sublabel nitroglycerin.  He underwent a follow-up [place] study of 08/12/2017 which was low risk and without ischemia.  EF was 57%.  There was minimal apical thinning most likely contributed by his body habitus.  He underwent an echo Doppler study on 10/14/2017 which showed an EF of 60-65% and grade 2  diastolic dysfunction.  There was moderate pulmonary hypertension with PA pressure at 46 mm.   ? ?At evaluation in March 2019 he was feeling better.  He denied recurrent chest pain or shortness of breath.  He is lost 21 pounds in 3 weeks purposefully.  ? ?He developed recurrent chest pain symptomatology and developed unstable angina symptoms leading to hospitalization with a non-ST segment elevation MI.  On April 12, 2018 he underwent diagnostic catheterization by Dr. Irish Lack due to multivessel CAD he underwent surgical consultation for  consideration of CABG surgery but was turned down.  As result, on April 14, 2018, I performed successful multi lesion/multivessel PCI to the RCA and LAD vessels.  He underwent Cutting Balloon PTCA to the mid PDA vessel of the RCA with a 95% stenosis being reduced to 15% and Cutting Balloon PTCA DES stenting with a 2.5 x12 mm Resolute stent inserted into the distal RCA immediately proximal to the takeoff of the PDA vessel with stent overlap to the previously placed stent with a 99% stenosis reduced to 0%.  He also underwent 3 lesion intervention to the LAD with PTCA of the 90% apical LAD stenosis, PTCA DES stenting at 2 additional study sites in the LAD successfully.  He still had residual concomitant 80% AV groove circumflex and circumflex marginal disease which was treated medically. ? ?Mr. [name] has noted significant improvement following his multilesion multivessel intervention to his RCA and LAD.  He has been seen in the office since by [name], NP as well as [name], PA.  When last seen by [name], he reduced his amlodipine since his blood pressure was low and he also had 2+ pitting edema for which he recommended restarting his previous torsemide daily. ? ?I saw him in November 2020.  He underwent an echo Doppler study 24 2020 which showed normal systolic with EF at 60 to 65%, mild LVH diastolic parameters were indeterminate.  He had moderately elevated pulmonary artery systolic pressure at 42 mm.   ? ?He was  evaluated by me on Nov 16, 2019.  He had done well since his last evaluation but over the past 2 weeks has again begun to notice some recurrent episodes of chest pain and shortness of breath.  He also has been diagnosed with pulmonary fibrosis.  He continues to experience mild leg swelling.  During that evaluation I recommended initiating isosorbide mononitrate 30 mg.  He had been on low-dose amlodipine at 2.5 mg and I recommended further titration of 5 mg.  He continues to be on metoprolol  tartrate 25 mg twice a day and remained on DAPT with aspirin/Plavix.   He was to follow-up with Dr. Marshell Garfinkel for his pulmonary fibrosis.  He has been started on Esbriet for his pulmonary fibrosis.  He has noticed leg swelling bilaterally left greater than right.  Approximately 2 weeks ago he took sublingual nitroglycerin with improvement of his mild chest pain.  During that evaluation, he was on amlodipine 5 mg and I recommended further titration of isosorbide to 60 mg daily and that he continue metoprolol tartrate 50 mg twice a day.  He had 2+ bilateral leg swelling and I recommended torsemide 20 mg daily and support stockings. ? ?I saw him in September 2021 and over the prior several months he had remained fairly stable and denied recurrent chest pain on his increased medical regimen.  He continues to be on supplemental oxygen for his pulmonary fibrosis.  He was continuing to experience some leg swelling  right greater than left but had not been using support stockings.  During that evaluation I again recommended he use support stockings. ? ?When I saw him on August 19, 2020 he was continuing to experience leg swelling and was taking torsemide 2 times per week.  He has noticed an increased heart rate with activity.  He has been on amlodipine 5 mg, isosorbide 60 mg, metoprolol 50 mg twice a day in addition to torsemide.  He continues to be on DAPT with aspirin/Plavix.  He is tolerating atorvastatin 80 mg.  Laboratory was checked at [facility] in July 2021 and lipids were excellent.  Recently he was sleeping in a recliner.  He has not had recent laboratory.  During that evaluation, he denied recurrent anginal symptoms but he noticed very quick increase in his heart rate.  I suggested slight titration of metoprolol to tartrate from 50 mg twice a day to 75 mg in the morning and 50 mg at night.  He had 2+ lower extremity edema and I suggested he take torsemide daily for the next several days and then perhaps  every other day.  He continued to be followed by Dr. Vaughan Browner for his interstitial fibrosis and was on Esbriet. ? ?I last saw him on May 19, 2021.  He was continuing to see Dr. Vaughan Browner for his probable UIP a

## 2021-09-21 DIAGNOSIS — M1991 Primary osteoarthritis, unspecified site: Secondary | ICD-10-CM | POA: Diagnosis not present

## 2021-09-21 DIAGNOSIS — M5137 Other intervertebral disc degeneration, lumbosacral region: Secondary | ICD-10-CM | POA: Diagnosis not present

## 2021-09-21 DIAGNOSIS — G894 Chronic pain syndrome: Secondary | ICD-10-CM | POA: Diagnosis not present

## 2021-09-21 DIAGNOSIS — E663 Overweight: Secondary | ICD-10-CM | POA: Diagnosis not present

## 2021-09-21 DIAGNOSIS — Z6829 Body mass index (BMI) 29.0-29.9, adult: Secondary | ICD-10-CM | POA: Diagnosis not present

## 2021-10-05 DIAGNOSIS — Z20822 Contact with and (suspected) exposure to covid-19: Secondary | ICD-10-CM | POA: Diagnosis not present

## 2021-10-22 DIAGNOSIS — Z6830 Body mass index (BMI) 30.0-30.9, adult: Secondary | ICD-10-CM | POA: Diagnosis not present

## 2021-10-22 DIAGNOSIS — G894 Chronic pain syndrome: Secondary | ICD-10-CM | POA: Diagnosis not present

## 2021-10-22 DIAGNOSIS — E6609 Other obesity due to excess calories: Secondary | ICD-10-CM | POA: Diagnosis not present

## 2021-10-27 DIAGNOSIS — Z20822 Contact with and (suspected) exposure to covid-19: Secondary | ICD-10-CM | POA: Diagnosis not present

## 2021-11-09 ENCOUNTER — Other Ambulatory Visit: Payer: Self-pay | Admitting: Pulmonary Disease

## 2021-11-09 ENCOUNTER — Encounter: Payer: Self-pay | Admitting: Pulmonary Disease

## 2021-11-09 ENCOUNTER — Ambulatory Visit (INDEPENDENT_AMBULATORY_CARE_PROVIDER_SITE_OTHER): Payer: Medicare Other | Admitting: Pulmonary Disease

## 2021-11-09 VITALS — BP 132/78 | HR 74 | Temp 97.7°F | Ht 72.0 in | Wt 215.0 lb

## 2021-11-09 DIAGNOSIS — Z5181 Encounter for therapeutic drug level monitoring: Secondary | ICD-10-CM

## 2021-11-09 DIAGNOSIS — I2510 Atherosclerotic heart disease of native coronary artery without angina pectoris: Secondary | ICD-10-CM | POA: Diagnosis not present

## 2021-11-09 DIAGNOSIS — J84112 Idiopathic pulmonary fibrosis: Secondary | ICD-10-CM | POA: Diagnosis not present

## 2021-11-09 DIAGNOSIS — R748 Abnormal levels of other serum enzymes: Secondary | ICD-10-CM

## 2021-11-09 DIAGNOSIS — R0600 Dyspnea, unspecified: Secondary | ICD-10-CM

## 2021-11-09 LAB — COMPREHENSIVE METABOLIC PANEL
ALT: 16 U/L (ref 0–53)
AST: 22 U/L (ref 0–37)
Albumin: 4 g/dL (ref 3.5–5.2)
Alkaline Phosphatase: 92 U/L (ref 39–117)
BUN: 20 mg/dL (ref 6–23)
CO2: 33 mEq/L — ABNORMAL HIGH (ref 19–32)
Calcium: 9.6 mg/dL (ref 8.4–10.5)
Chloride: 103 mEq/L (ref 96–112)
Creatinine, Ser: 1.21 mg/dL (ref 0.40–1.50)
GFR: 59.26 mL/min — ABNORMAL LOW (ref >60.00)
Glucose, Bld: 109 mg/dL — ABNORMAL HIGH (ref 70–99)
Potassium: 4.2 mEq/L (ref 3.5–5.1)
Sodium: 141 mEq/L (ref 135–145)
Total Bilirubin: 1.4 mg/dL — ABNORMAL HIGH (ref 0.2–1.2)
Total Protein: 6.9 g/dL (ref 6.0–8.3)

## 2021-11-09 NOTE — Progress Notes (Signed)
Spoke with pt and voices understanding. Pt did not have any further questions.

## 2021-11-09 NOTE — Patient Instructions (Addendum)
Continue current treatment of this. We will check comprehensive metabolic panel and proBNP for dyspnea today Follow-up in 6 mont

## 2021-11-09 NOTE — Progress Notes (Signed)
I called the patient to give him labs for repeat blood work. Order placed for 1 month repeat.

## 2021-11-09 NOTE — Progress Notes (Signed)
Maurice Munoz    [id number]    01/06/1946  Primary Care Physician:Fusco, Purcell Nails, MD  Referring Physician: [employer], [address] [place],  [place] [address]  Chief complaint: Follow-up for Idiopathic pulmonary fibrosis [name] January 6435  HPI: 76 year old with history of coronary artery disease, pulmonary fibrosis.  Previously followed by [name].  Prior CT scan shows pulmonary fibrosis which is thought to be secondary to occupational exposure Referred back for worsening dyspnea Discussed at multidisciplinary conference on 07/03/19 with diagnosis of IPF and started on Esbriet  Patient has established coronary artery disease status post multiple stenting in 2019.  He also has residual circumflex disease that was not intervened upon.  Pets: Dog.  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  No ongoing exposures.  No mold, hot tub, Jacuzzi or down pillows or comforters Smoking history: 75-pack-year smoker.  Quit in 1990 Travel history: No significant travel history Relevant family history: Father died of lung cancer.  He was a smoker.  Interim history: Is tolerating Esbriet well without issues States that breathing is slightly worse.  Continues on supplemental oxygen  Here for review of CT and PFTs.  Outpatient Encounter Medications as of 11/09/2021  Medication Sig   acetaminophen (TYLENOL) 500 MG tablet Take 1,000 mg by mouth every 6 (six) hours as needed.   allopurinol (ZYLOPRIM) 300 MG tablet Take 300 mg by mouth every morning.    amLODipine (NORVASC) 5 MG tablet TAKE ONE TABLET ('5MG'$  TOTAL) BY MOUTH DAILY. (Patient taking differently: Take 5 mg by mouth daily.)   aspirin EC 81 MG tablet Take 81 mg by mouth daily.   atorvastatin (LIPITOR) 80 MG tablet TAKE ONE TABLET ('80MG'$  TOTAL) BY MOUTH AT6PM (Patient  taking differently: Take 80 mg by mouth daily.)   b complex vitamins tablet Take 1 tablet by mouth daily.   benzonatate (TESSALON) 200 MG capsule TAKE ONE CAPSULE ('200MG'$  TOTAL) BY MOUTH TWO TIMES DAILY AS NEEDED FOR COUGH (Patient taking differently: Take 200 mg by mouth 2 (two) times daily as needed for cough.)   cholecalciferol (VITAMIN D) 1000 units tablet Take 5,000 Units by mouth every morning.    clopidogrel (PLAVIX) 75 MG tablet TAKE ONE (1) TABLET BY MOUTH EVERY DAY   isosorbide mononitrate (IMDUR) 60 MG 24 hr tablet TAKE ONE TABLET ('60MG'$  TOTAL) BY MOUTH DAILY (Patient taking differently: Take 60 mg by mouth daily.)   lisinopril (ZESTRIL) 5 MG tablet Take 2.5 mg by mouth daily.   metoprolol tartrate (LOPRESSOR) 50 MG tablet TAKE 1.5 TABLET ('75MG'$ ) IN THE MORNING AND 1 TABLET ('50MG'$ ) IN THE EVNING (Patient taking differently: 50 mg 2 (two) times daily. TAKE 1 (50 MG) IN THE MORNING AND 1 TABLET ('50MG'$ ) IN THE EVNING)   naproxen sodium (ALEVE) 220 MG tablet Take 440 mg by mouth.   oxyCODONE-acetaminophen (PERCOCET) 10-325 MG per tablet Take 1 tablet by mouth every 8 (eight) hours as needed for pain.    Pirfenidone (ESBRIET) 801 MG TABS Take 1 tablet by mouth 3 (three) times daily with meals.   prochlorperazine (COMPAZINE) 10 MG tablet TAKE ONE TABLET ('10MG'$  TOTAL) BY MOUTH EVERY SIX HOURS AS NEEDED FOR NAUSEA OR VOMITING (Patient taking differently: Take 10 mg by mouth every 6 (six) hours as needed for nausea or vomiting.)   tiZANidine (ZANAFLEX) 4 MG tablet Take 4  mg by mouth at bedtime.    torsemide (DEMADEX) 20 MG tablet TAKE ONE TABLET ('20MG'$  TOTAL) BY MOUTH DAILY. IF ADDITIONAL SWELLING, MAY TAKE AN ADDITIONAL DOSE IN THE AFTERNOON (Patient taking differently: Take 20 mg by mouth daily. May take an additional dose the afternoon)   nitroGLYCERIN (NITROSTAT) 0.4 MG SL tablet Place 1 tablet (0.4 mg total) under the tongue every 5 (five) minutes as needed for chest pain.   No facility-administered  encounter medications on file as of 11/09/2021.   Physical Exam: Blood pressure 132/78, pulse 74, temperature 97.7 F (36.5 C), temperature source Oral, height 6' (1.829 m), weight 215 lb (97.5 kg), SpO2 93 %. Gen:      No acute distress HEENT:  EOMI, sclera anicteric Neck:     No masses; no thyromegaly Lungs:    Bibasal crackles CV:         Regular rate and rhythm; no murmurs Abd:      + bowel sounds; soft, non-tender; no palpable masses, no distension Ext:    No edema; adequate peripheral perfusion Skin:      Warm and dry; no rash Neuro: alert and oriented x 3 Psych: normal mood and affect   Data Reviewed: Imaging: CT high-resolution February 05, 2015 patchy areas of groundglass attenuation, septal thickening with mild basal gradient. CT chest high-resolution 05/09/2016-subpleural reticulation, mild traction bronchiolectasis, mild honeycombing.  Definite UIP pattern high-resolution  CT chest high-resolution 04/24/2019-progression of fibrotic lung lung disease and definite UIP pattern. CT high-resolution 08/20/2021-progression of UIP pattern pulmonary fibrosis. I have reviewed the images personally.  PFTs: 04/29/2017 FVC 2.66 [55%], FEV1 2.35 [66%], F/F 88, TLC 4.10 [55%], DLCO 15.60 [44%] Moderate restriction with severe diffusion defect.  Worsening lung function compared to 2016.  05/28/2019 FVC 2.34 [49%], FEV1 1.96 [56%], F/F 84, TLC 3.50 [47%], DLCO 12.72 [46%] Severe restriction, diffusion defect.  Worsening lung volumes and diffusion capacity compared to 2008  09/19/2020 FVC 2.18 [46%), FEV1 1.87 (54%], F/F 86, TLC 3.33 [44%], DLCO 12.05 [44%] Severe restriction, diffusion defect with worsening  06/23/2021 FVC 2.18 [46%], FEV1 1.54 [45%], F/F 71, TLC 3.53 [47%], DLCO 6.05 [22%] Severe restriction, severe diffusion defect  Labs: CTD serologies 02/23/2017-negative ANA, Jo 1, centromere antibody, rheumatoid factor, Ro, La, SCL 70  Repeat CTD serologies-negative ANA, CCP CK,  aldolase, myositis panel, SCL 70, Ro, La Rheumatoid factor 16  Hepatic panel 08/25/2020-within normal limits  Cardiac: Echocardiogram 06/10/2021 LVEF 60 to 65%, mild concentric LVH, normal PA systolic pressure.  RVSP 31.4  Assessment:  Idiopathic pulmonary fibrosis He has 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  Although there is some occupational exposures he has not had ongoing exposure since 2000.  He does not have signs and symptoms of connective tissue disease.  CTD serologies significant for borderline rheumatoid factor which is nonsignificant  Discussed at multidisciplinary conference with a diagnosis of IPF Continue Esbriet.  Has occasional nausea and fatigue with the med Zofran as needed  Check labs for monitoring  Evaluation for pulmonary hypertension Enlarged PA noted on CT scan.  However no evidence of pulmonary hypertension on echocardiogram.  Check proBNP for monitoring  Plan/Recommendations: - Continue esbriet.   - Labs   Marshell Garfinkel MD [facility] 11/09/2021, 9:10 AM  CC: [employer], MD

## 2021-11-10 LAB — PRO B NATRIURETIC PEPTIDE: NT-Pro BNP: 754 pg/mL — ABNORMAL HIGH (ref 0–376)

## 2021-11-20 DIAGNOSIS — E663 Overweight: Secondary | ICD-10-CM | POA: Diagnosis not present

## 2021-11-20 DIAGNOSIS — J9611 Chronic respiratory failure with hypoxia: Secondary | ICD-10-CM | POA: Diagnosis not present

## 2021-11-20 DIAGNOSIS — M5137 Other intervertebral disc degeneration, lumbosacral region: Secondary | ICD-10-CM | POA: Diagnosis not present

## 2021-11-20 DIAGNOSIS — M48061 Spinal stenosis, lumbar region without neurogenic claudication: Secondary | ICD-10-CM | POA: Diagnosis not present

## 2021-11-20 DIAGNOSIS — I10 Essential (primary) hypertension: Secondary | ICD-10-CM | POA: Diagnosis not present

## 2021-11-20 DIAGNOSIS — Z6829 Body mass index (BMI) 29.0-29.9, adult: Secondary | ICD-10-CM | POA: Diagnosis not present

## 2021-11-20 DIAGNOSIS — I2510 Atherosclerotic heart disease of native coronary artery without angina pectoris: Secondary | ICD-10-CM | POA: Diagnosis not present

## 2021-11-20 DIAGNOSIS — G894 Chronic pain syndrome: Secondary | ICD-10-CM | POA: Diagnosis not present

## 2021-11-20 DIAGNOSIS — M1991 Primary osteoarthritis, unspecified site: Secondary | ICD-10-CM | POA: Diagnosis not present

## 2021-11-20 DIAGNOSIS — J449 Chronic obstructive pulmonary disease, unspecified: Secondary | ICD-10-CM | POA: Diagnosis not present

## 2021-11-20 DIAGNOSIS — J8410 Pulmonary fibrosis, unspecified: Secondary | ICD-10-CM | POA: Diagnosis not present

## 2021-12-10 ENCOUNTER — Other Ambulatory Visit (INDEPENDENT_AMBULATORY_CARE_PROVIDER_SITE_OTHER): Payer: Medicare Other

## 2021-12-10 DIAGNOSIS — Z5181 Encounter for therapeutic drug level monitoring: Secondary | ICD-10-CM

## 2021-12-10 DIAGNOSIS — R748 Abnormal levels of other serum enzymes: Secondary | ICD-10-CM

## 2021-12-10 DIAGNOSIS — J84112 Idiopathic pulmonary fibrosis: Secondary | ICD-10-CM | POA: Diagnosis not present

## 2021-12-10 LAB — COMPREHENSIVE METABOLIC PANEL
ALT: 20 U/L (ref 0–53)
AST: 29 U/L (ref 0–37)
Albumin: 4.1 g/dL (ref 3.5–5.2)
Alkaline Phosphatase: 98 U/L (ref 39–117)
BUN: 23 mg/dL (ref 6–23)
CO2: 31 mEq/L (ref 19–32)
Calcium: 10 mg/dL (ref 8.4–10.5)
Chloride: 104 mEq/L (ref 96–112)
Creatinine, Ser: 1.21 mg/dL (ref 0.40–1.50)
GFR: 59.22 mL/min — ABNORMAL LOW (ref >60.00)
Glucose, Bld: 85 mg/dL (ref 70–99)
Potassium: 4.2 mEq/L (ref 3.5–5.1)
Sodium: 141 mEq/L (ref 135–145)
Total Bilirubin: 1.3 mg/dL — ABNORMAL HIGH (ref 0.2–1.2)
Total Protein: 7.3 g/dL (ref 6.0–8.3)

## 2021-12-29 ENCOUNTER — Other Ambulatory Visit: Payer: Self-pay | Admitting: Cardiovascular Disease

## 2021-12-30 DIAGNOSIS — R809 Proteinuria, unspecified: Secondary | ICD-10-CM | POA: Diagnosis not present

## 2021-12-30 DIAGNOSIS — I5032 Chronic diastolic (congestive) heart failure: Secondary | ICD-10-CM | POA: Diagnosis not present

## 2021-12-30 DIAGNOSIS — N1831 Chronic kidney disease, stage 3a: Secondary | ICD-10-CM | POA: Diagnosis not present

## 2021-12-30 DIAGNOSIS — I9589 Other hypotension: Secondary | ICD-10-CM | POA: Diagnosis not present

## 2021-12-30 DIAGNOSIS — Z8679 Personal history of other diseases of the circulatory system: Secondary | ICD-10-CM | POA: Diagnosis not present

## 2022-01-20 ENCOUNTER — Other Ambulatory Visit: Payer: Self-pay | Admitting: Cardiovascular Disease

## 2022-01-20 NOTE — Telephone Encounter (Signed)
Rx(s) sent to pharmacy electronically.  

## 2022-02-01 ENCOUNTER — Other Ambulatory Visit: Payer: Self-pay | Admitting: Cardiovascular Disease

## 2022-02-17 ENCOUNTER — Other Ambulatory Visit: Payer: Self-pay | Admitting: Cardiovascular Disease

## 2022-02-19 DIAGNOSIS — E663 Overweight: Secondary | ICD-10-CM | POA: Diagnosis not present

## 2022-02-19 DIAGNOSIS — M48061 Spinal stenosis, lumbar region without neurogenic claudication: Secondary | ICD-10-CM | POA: Diagnosis not present

## 2022-02-19 DIAGNOSIS — Z6829 Body mass index (BMI) 29.0-29.9, adult: Secondary | ICD-10-CM | POA: Diagnosis not present

## 2022-02-19 DIAGNOSIS — J449 Chronic obstructive pulmonary disease, unspecified: Secondary | ICD-10-CM | POA: Diagnosis not present

## 2022-02-19 DIAGNOSIS — G894 Chronic pain syndrome: Secondary | ICD-10-CM | POA: Diagnosis not present

## 2022-02-19 DIAGNOSIS — I10 Essential (primary) hypertension: Secondary | ICD-10-CM | POA: Diagnosis not present

## 2022-03-08 ENCOUNTER — Other Ambulatory Visit: Payer: Self-pay | Admitting: Cardiovascular Disease

## 2022-03-22 ENCOUNTER — Other Ambulatory Visit: Payer: Self-pay | Admitting: Cardiovascular Disease

## 2022-04-14 DIAGNOSIS — Z23 Encounter for immunization: Secondary | ICD-10-CM | POA: Diagnosis not present

## 2022-05-17 ENCOUNTER — Telehealth: Payer: Self-pay

## 2022-05-17 NOTE — Telephone Encounter (Signed)
Received fax from [facility] stating that pt's consent form was expiring on 05/22/2022.  Contacted pt who stated that [place] had recently called him directly regarding this as well. Collected household size and annual income (pt stated that his wife had passed away, he is still listed as "married" under the demographics tab--changed to "widowed" during this encounter) and collected consent to sign on pt's behalf.   Completed form and updated directly to [facility] portal, also sent to scan center for retention. Nothing further should be required at this time.

## 2022-05-21 ENCOUNTER — Telehealth: Payer: Self-pay | Admitting: Pharmacist

## 2022-05-21 DIAGNOSIS — J449 Chronic obstructive pulmonary disease, unspecified: Secondary | ICD-10-CM | POA: Diagnosis not present

## 2022-05-21 DIAGNOSIS — I9589 Other hypotension: Secondary | ICD-10-CM | POA: Diagnosis not present

## 2022-05-21 DIAGNOSIS — J8410 Pulmonary fibrosis, unspecified: Secondary | ICD-10-CM | POA: Diagnosis not present

## 2022-05-21 DIAGNOSIS — Z6828 Body mass index (BMI) 28.0-28.9, adult: Secondary | ICD-10-CM | POA: Diagnosis not present

## 2022-05-21 DIAGNOSIS — G894 Chronic pain syndrome: Secondary | ICD-10-CM | POA: Diagnosis not present

## 2022-05-21 DIAGNOSIS — N1831 Chronic kidney disease, stage 3a: Secondary | ICD-10-CM | POA: Diagnosis not present

## 2022-05-21 DIAGNOSIS — Z8679 Personal history of other diseases of the circulatory system: Secondary | ICD-10-CM | POA: Diagnosis not present

## 2022-05-21 DIAGNOSIS — J84112 Idiopathic pulmonary fibrosis: Secondary | ICD-10-CM

## 2022-05-21 DIAGNOSIS — I10 Essential (primary) hypertension: Secondary | ICD-10-CM | POA: Diagnosis not present

## 2022-05-21 DIAGNOSIS — R809 Proteinuria, unspecified: Secondary | ICD-10-CM | POA: Diagnosis not present

## 2022-05-21 DIAGNOSIS — I5032 Chronic diastolic (congestive) heart failure: Secondary | ICD-10-CM | POA: Diagnosis not present

## 2022-05-21 DIAGNOSIS — J9611 Chronic respiratory failure with hypoxia: Secondary | ICD-10-CM | POA: Diagnosis not present

## 2022-05-21 DIAGNOSIS — E663 Overweight: Secondary | ICD-10-CM | POA: Diagnosis not present

## 2022-05-21 DIAGNOSIS — M1991 Primary osteoarthritis, unspecified site: Secondary | ICD-10-CM | POA: Diagnosis not present

## 2022-05-21 DIAGNOSIS — M48061 Spinal stenosis, lumbar region without neurogenic claudication: Secondary | ICD-10-CM | POA: Diagnosis not present

## 2022-05-21 MED ORDER — PIRFENIDONE 801 MG PO TABS: 1.0000 | ORAL_TABLET | Freq: Three times a day (TID) | ORAL | 0 refills | Status: DC

## 2022-05-21 NOTE — Telephone Encounter (Signed)
Patient is scheduled for follow up on 12/5 at 11am with PM- patient informed Esbriet refill was sent in.

## 2022-05-21 NOTE — Telephone Encounter (Signed)
Refill sent for ESBRIET to [facility] ([facility]) for Esbriet: [phone]  Dose: 801 mg three times daily  Last OV: 11/09/21 Provider: Dr. Vaughan Browner  Next OV: patient OVERDUE for f/u appt  LFTs on 12/10/21 wnl  Knox Saliva, PharmD, MPH, BCPS Clinical Pharmacist (Rheumatology and Pulmonology)

## 2022-05-25 ENCOUNTER — Ambulatory Visit (INDEPENDENT_AMBULATORY_CARE_PROVIDER_SITE_OTHER): Payer: Medicare Other | Admitting: Pulmonary Disease

## 2022-05-25 ENCOUNTER — Encounter: Payer: Self-pay | Admitting: Pulmonary Disease

## 2022-05-25 VITALS — BP 142/74 | HR 97 | Temp 97.5°F | Ht 72.0 in | Wt 202.0 lb

## 2022-05-25 DIAGNOSIS — Z5181 Encounter for therapeutic drug level monitoring: Secondary | ICD-10-CM | POA: Diagnosis not present

## 2022-05-25 DIAGNOSIS — R0602 Shortness of breath: Secondary | ICD-10-CM

## 2022-05-25 DIAGNOSIS — J84112 Idiopathic pulmonary fibrosis: Secondary | ICD-10-CM | POA: Diagnosis not present

## 2022-05-25 DIAGNOSIS — I2510 Atherosclerotic heart disease of native coronary artery without angina pectoris: Secondary | ICD-10-CM

## 2022-05-25 LAB — HEPATIC FUNCTION PANEL
ALT: 11 U/L (ref 0–53)
AST: 18 U/L (ref 0–37)
Albumin: 3.9 g/dL (ref 3.5–5.2)
Alkaline Phosphatase: 113 U/L (ref 39–117)
Bilirubin, Direct: 0.2 mg/dL (ref 0.0–0.3)
Total Bilirubin: 0.9 mg/dL (ref 0.2–1.2)
Total Protein: 6.8 g/dL (ref 6.0–8.3)

## 2022-05-25 NOTE — Telephone Encounter (Signed)
Refill for Esbriet is pending updated labs from 05/25/22 OV  Knox Saliva, PharmD, MPH, BCPS, CPP Clinical Pharmacist (Rheumatology and Pulmonology)

## 2022-05-25 NOTE — Progress Notes (Signed)
Maurice Munoz    [id number]    01/07/1946  Primary Care Physician:Fusco, Purcell Nails, MD  Referring Physician: [employer], [place] [address] [place],  [place] [address]  Chief complaint: Follow-up for Idiopathic pulmonary fibrosis [name] January 2021  HPI: 76 y.o.  with history of coronary artery disease, pulmonary fibrosis.  Previously followed by [name].  Prior CT scan shows pulmonary fibrosis which is thought to be secondary to occupational exposure Referred back for worsening dyspnea Discussed at multidisciplinary conference on 07/03/19 with diagnosis of IPF and started on Esbriet  Patient has established coronary artery disease status post multiple stenting in 2019.  He also has residual circumflex disease that was not intervened upon.  Pets: Dog.  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  No ongoing exposures.  No mold, hot tub, Jacuzzi or down pillows or comforters Smoking history: 75-pack-year smoker.  Quit in 1990 Travel history: No significant travel history Relevant family history: Father died of lung cancer.  He was a smoker.  Interim history: Continues on Esbriet.  He takes just 2 pills a day on most days since he skips lunch States that breathing is slightly worse.  Continues on supplemental oxygen   Outpatient Encounter Medications as of 05/25/2022  Medication Sig   acetaminophen (TYLENOL) 500 MG tablet Take 1,000 mg by mouth every 6 (six) hours as needed.   allopurinol (ZYLOPRIM) 300 MG tablet Take 300 mg by mouth every morning.    amLODipine (NORVASC) 5 MG tablet Take 1 tablet (5 mg total) by mouth daily.   aspirin EC 81 MG tablet Take 81 mg by mouth daily.   atorvastatin (LIPITOR) 80 MG tablet TAKE ONE TABLET ('80MG'$  TOTAL) BY MOUTH AT6PM   b complex vitamins tablet Take 1 tablet by mouth daily.    benzonatate (TESSALON) 200 MG capsule TAKE ONE CAPSULE ('200MG'$  TOTAL) BY MOUTH TWO TIMES DAILY AS NEEDED FOR COUGH (Patient taking differently: Take 200 mg by mouth 2 (two) times daily as needed for cough.)   cholecalciferol (VITAMIN D) 1000 units tablet Take 5,000 Units by mouth every morning.    clopidogrel (PLAVIX) 75 MG tablet TAKE ONE (1) TABLET BY MOUTH EVERY DAY   isosorbide mononitrate (IMDUR) 60 MG 24 hr tablet TAKE ONE TABLET ('60MG'$  TOTAL) BY MOUTH DAILY   lisinopril (ZESTRIL) 5 MG tablet Take 2.5 mg by mouth daily.   metoprolol tartrate (LOPRESSOR) 50 MG tablet Take 1 tablet (50 mg total) by mouth 2 (two) times daily.   naproxen sodium (ALEVE) 220 MG tablet Take 440 mg by mouth.   oxyCODONE-acetaminophen (PERCOCET) 10-325 MG per tablet Take 1 tablet by mouth every 8 (eight) hours as needed for pain.    Pirfenidone (ESBRIET) 801 MG TABS Take 1 tablet by mouth 3 (three) times daily with meals.   prochlorperazine (COMPAZINE) 10 MG tablet TAKE ONE TABLET ('10MG'$  TOTAL) BY MOUTH EVERY SIX HOURS AS NEEDED FOR NAUSEA OR VOMITING (Patient taking differently: Take 10 mg by mouth every 6 (six) hours as needed for nausea or vomiting.)   tiZANidine (ZANAFLEX) 4 MG tablet Take 4 mg by mouth at bedtime.    torsemide (DEMADEX) 20 MG tablet TAKE ONE TABLET ('20MG'$  TOTAL) BY MOUTH DAILY. IF ADDITIONAL SWELLING, MAY TAKE AN ADDITIONAL DOSE IN THE AFTERNOON (Patient taking differently: Take 20 mg by mouth daily. May take an  additional dose the afternoon)   nitroGLYCERIN (NITROSTAT) 0.4 MG SL tablet Place 1 tablet (0.4 mg total) under the tongue every 5 (five) minutes as needed for chest pain.   No facility-administered encounter medications on file as of 05/25/2022.   Physical Exam: Blood pressure (!) 142/74, pulse 97, temperature (!) 97.5 F (36.4 C), temperature source Oral, height 6' (1.829 m), weight 202 lb (91.6 kg), SpO2 95 %. Gen:      No acute distress HEENT:  EOMI, sclera anicteric Neck:     No  masses; no thyromegaly Lungs:    Bibasal crackles CV:         Regular rate and rhythm; no murmurs Abd:      + bowel sounds; soft, non-tender; no palpable masses, no distension Ext:    No edema; adequate peripheral perfusion Skin:      Warm and dry; no rash Neuro: alert and oriented x 3 Psych: normal mood and affect   Data Reviewed: Imaging: CT high-resolution February 05, 2015 patchy areas of groundglass attenuation, septal thickening with mild basal gradient. CT chest high-resolution 05/09/2016-subpleural reticulation, mild traction bronchiolectasis, mild honeycombing.  Definite UIP pattern high-resolution  CT chest high-resolution 04/24/2019-progression of fibrotic lung lung disease and definite UIP pattern. CT high-resolution 08/20/2021-progression of UIP pattern pulmonary fibrosis. I have reviewed the images personally.  PFTs: 04/29/2017 FVC 2.66 [55%], FEV1 2.35 [66%], F/F 88, TLC 4.10 [55%], DLCO 15.60 [44%] Moderate restriction with severe diffusion defect.  Worsening lung function compared to 2016.  05/28/2019 FVC 2.34 [49%], FEV1 1.96 [56%], F/F 84, TLC 3.50 [47%], DLCO 12.72 [46%] Severe restriction, diffusion defect.  Worsening lung volumes and diffusion capacity compared to 2008  09/19/2020 FVC 2.18 [46%), FEV1 1.87 (54%], F/F 86, TLC 3.33 [44%], DLCO 12.05 [44%] Severe restriction, diffusion defect with worsening  06/23/2021 FVC 2.18 [46%], FEV1 1.54 [45%], F/F 71, TLC 3.53 [47%], DLCO 6.05 [22%] Severe restriction, severe diffusion defect  Labs: CTD serologies 02/23/2017-negative ANA, Jo 1, centromere antibody, rheumatoid factor, Ro, La, SCL 70  Repeat CTD serologies-negative ANA, CCP CK, aldolase, myositis panel, SCL 70, Ro, La Rheumatoid factor 16  Hepatic panel 08/25/2020-within normal limits  Cardiac: Echocardiogram 06/10/2021 LVEF 60 to 65%, mild concentric LVH, normal PA systolic pressure.  RVSP 31.4  Assessment:  Idiopathic pulmonary fibrosis He has progressive  lung fibrosis in UIP pattern.  Although there is some occupational exposures he has not had ongoing exposure since 2000.  He does not have signs and symptoms of connective tissue disease.  CTD serologies significant for borderline rheumatoid factor which is nonsignificant  Discussed at multidisciplinary conference with a diagnosis of IPF Continue Esbriet at a reduced dose of twice daily.  Has occasional nausea and fatigue with the med Zofran as needed Check labs for monitoring  His worsening dyspnea may be progression of disease.  We discussed changing to Ofev but he does not want to try it due to side effects of diarrhea Get high-res CT and PFTs follow-up in 4 months.  Evaluation for pulmonary hypertension Enlarged PA noted on CT scan.  However no evidence of pulmonary hypertension on echocardiogram 1 year ago.  Check proBNP for monitoring and reorder echo as he has worsening dyspnea.  Plan/Recommendations: - Continue esbriet.   - Labs - Echocardiogram - Follow-up high-res CT, PFTs   Marshell Garfinkel MD [facility] 05/25/2022, 11:00 AM  CC: [employer], MD

## 2022-05-25 NOTE — Telephone Encounter (Signed)
-----   Message from Elton Sin, LPN sent at 46/01/320 11:46 AM EST ----- Regarding: Esbriet refill Patient came in for OV this morning with Dr. Vaughan Browner.  Patient is requesting Esbriet prescription be sent to pharmacy with refills.   Thank you, Leander Rams

## 2022-05-25 NOTE — Patient Instructions (Addendum)
Recheck hepatic panel, probnp today Continue Esbriet Will order echocardiogram for evaluation of pulmonary hypertension Order high-res CT, PFTs in 4 months and return to clinic

## 2022-05-26 ENCOUNTER — Encounter: Payer: Self-pay | Admitting: Cardiovascular Disease

## 2022-05-26 ENCOUNTER — Ambulatory Visit: Payer: Medicare Other | Attending: Cardiovascular Disease | Admitting: Cardiovascular Disease

## 2022-05-26 DIAGNOSIS — J8410 Pulmonary fibrosis, unspecified: Secondary | ICD-10-CM

## 2022-05-26 DIAGNOSIS — I10 Essential (primary) hypertension: Secondary | ICD-10-CM | POA: Diagnosis not present

## 2022-05-26 DIAGNOSIS — E785 Hyperlipidemia, unspecified: Secondary | ICD-10-CM | POA: Diagnosis not present

## 2022-05-26 DIAGNOSIS — I2510 Atherosclerotic heart disease of native coronary artery without angina pectoris: Secondary | ICD-10-CM | POA: Diagnosis not present

## 2022-05-26 DIAGNOSIS — R600 Localized edema: Secondary | ICD-10-CM

## 2022-05-26 DIAGNOSIS — N1831 Chronic kidney disease, stage 3a: Secondary | ICD-10-CM

## 2022-05-26 DIAGNOSIS — I25119 Atherosclerotic heart disease of native coronary artery with unspecified angina pectoris: Secondary | ICD-10-CM

## 2022-05-26 LAB — PRO B NATRIURETIC PEPTIDE: NT-Pro BNP: 145 pg/mL (ref 0–376)

## 2022-05-26 MED ORDER — ESBRIET 801 MG PO TABS: 1.0000 | ORAL_TABLET | Freq: Three times a day (TID) | ORAL | 1 refills | Status: DC

## 2022-05-26 NOTE — Progress Notes (Signed)
Patient ID: Maurice Munoz, male   DOB: Apr 14, 1946, 76 y.o.   MRN: [id number]        HPI: Maurice Munoz, is a 76 y.o. male who presents to the office today for a 9 month cardiology followup evaluation.     [name] has established CAD dating back to 1999 when he underwent intervention to his RCA and PDA. In April 2007 he underwent stenting to his LAD and distal RCA at which time Cypher DES stents were inserted. He has a history of hypertension, obesity, hyperlipidemia, gout, and  lower extremity edema.   A two-year followup nuclear perfusion study in March 2014 was essentially unchanged from his prior study and continued to show fairly normal perfusion and was low risk, without evidence for scar. There was no significant ischemia.  In 2015 he had an injury to his right leg.  He had noted some intermittent leg swelling.  He had carotids Doppler study in January 2016 which showed 50-60% left proximal ICA diameter reduction, which had slightly increased from his prior study one year previously.  Because of a history of coughing up blood with pleuritic chest pain he underwent a CT [place] of his chest on March 21 which was negative for PE but showed diffuse peripheral lung densities throughout both upper and lower zones without honeycombing raising the [name] concern for mild fibrotic changes but the pattern was nonspecific.    Over the last several years he has lost over 40 pounds from his peak weight.  He  was seen in the emergency room with lightheadedness and was hypotensive.  He was told to discontinue his isosorbide as well as lisinopril.   In the setting of his hypotension he was found to have an increased creatinine at 1.4 recently had been 1.1. He has been followed by Dr.Befakadu in [place] for his renal issues.  Over the past year, he has experienced occasional episodes of chest pain which he describes as tightness.  Typically, this can occur with exertion.  At times he also notices  a chest burning.  He admits to shortness of breath.  He has been evaluated by [name] for pulmonary fibrosis.  He has an extensive occupational history infrequently had worked in Lobbyist, Engineer, materials, and chrome plating for many years and was often exposed to significant dust inhalation.  He also had worked in Industrial/product designer.  There is remote tobacco history of 2-3 packs per day for 20-25 years, but unfortunately he quit in 1980s.  He is felt most likely to have interstitial lung disease and continues further evaluation.  He has had difficulty with his low back.  He has been on lisinopril 5 mg, metoprolol 50 mg twice a day.  He continues to take simvastatin 20 mg for hyperlipidemia.  He is been maintained on aspirin and Plavix.  There is remote history of gout, which is controlled with allopurinol.  When I saw him on February 2019, he had noticed episodes of chest burning as well as chest tightness and continues to experience exertional dyspnea.  I initiated amlodipine at 5 mg.  I strongly recommended support stockings for his potential edema.  He was given a prescription for sublabel nitroglycerin.  He underwent a follow-up [place] study of 08/12/2017 which was low risk and without ischemia.  EF was 57%.  There was minimal apical thinning most likely contributed by his body habitus.  He underwent an echo Doppler study on 10/14/2017 which showed an EF of 60-65% and  grade 2 diastolic dysfunction.  There was moderate pulmonary hypertension with PA pressure at 46 mm.    At evaluation in March 2019 he was feeling better.  He denied recurrent chest pain or shortness of breath.  He is lost 21 pounds in 3 weeks purposefully.   He developed recurrent chest pain symptomatology and developed unstable angina symptoms leading to hospitalization with a non-ST segment elevation MI.  On April 12, 2018 he underwent diagnostic catheterization by Dr. Irish Lack due to multivessel CAD he underwent surgical consultation for  consideration of CABG surgery but was turned down.  As result, on April 14, 2018, I performed successful multi lesion/multivessel PCI to the RCA and LAD vessels.  He underwent Cutting Balloon PTCA to the mid PDA vessel of the RCA with a 95% stenosis being reduced to 15% and Cutting Balloon PTCA DES stenting with a 2.5 x12 mm Resolute stent inserted into the distal RCA immediately proximal to the takeoff of the PDA vessel with stent overlap to the previously placed stent with a 99% stenosis reduced to 0%.  He also underwent 3 lesion intervention to the LAD with PTCA of the 90% apical LAD stenosis, PTCA DES stenting at 2 additional study sites in the LAD successfully.  He still had residual concomitant 80% AV groove circumflex and circumflex marginal disease which was treated medically.  Maurice Munoz has noted significant improvement following his multilesion multivessel intervention to his RCA and LAD.  He has been seen in the office since by [name], NP as well as [name], PA.  When last seen by Maurice Munoz, he reduced his amlodipine since his blood pressure was low and he also had 2+ pitting edema for which he recommended restarting his previous torsemide daily.  I saw him in November 2020.  He underwent an echo Doppler study 24 2020 which showed normal systolic with EF at 60 to 65%, mild LVH diastolic parameters were indeterminate.  He had moderately elevated pulmonary artery systolic pressure at 42 mm.    He was  evaluated by me on Nov 16, 2019.  He had done well since his last evaluation but over the past 2 weeks has again begun to notice some recurrent episodes of chest pain and shortness of breath.  He also has been diagnosed with pulmonary fibrosis.  He continues to experience mild leg swelling.  During that evaluation I recommended initiating isosorbide mononitrate 30 mg.  He had been on low-dose amlodipine at 2.5 mg and I recommended further titration of 5 mg.  He continues to be on metoprolol  tartrate 25 mg twice a day and remained on DAPT with aspirin/Plavix.   He was to follow-up with Dr. Marshell Garfinkel for his pulmonary fibrosis.  He has been started on Esbriet for his pulmonary fibrosis.  He has noticed leg swelling bilaterally left greater than right.  Approximately 2 weeks ago he took sublingual nitroglycerin with improvement of his mild chest pain.  During that evaluation, he was on amlodipine 5 mg and I recommended further titration of isosorbide to 60 mg daily and that he continue metoprolol tartrate 50 mg twice a day.  He had 2+ bilateral leg swelling and I recommended torsemide 20 mg daily and support stockings.  I saw him in September 2021 and over the prior several months he had remained fairly stable and denied recurrent chest pain on his increased medical regimen.  He continues to be on supplemental oxygen for his pulmonary fibrosis.  He was continuing to experience some  leg swelling right greater than left but had not been using support stockings.  During that evaluation I again recommended he use support stockings.  When I saw him on August 19, 2020 he was continuing to experience leg swelling and was taking torsemide 2 times per week.  He has noticed an increased heart rate with activity.  He has been on amlodipine 5 mg, isosorbide 60 mg, metoprolol 50 mg twice a day in addition to torsemide.  He continues to be on DAPT with aspirin/Plavix.  He is tolerating atorvastatin 80 mg.  Laboratory was checked at [facility] in July 2021 and lipids were excellent.  Recently he was sleeping in a recliner.  He has not had recent laboratory.  During that evaluation, he denied recurrent anginal symptoms but he noticed very quick increase in his heart rate.  I suggested slight titration of metoprolol to tartrate from 50 mg twice a day to 75 mg in the morning and 50 mg at night.  He had 2+ lower extremity edema and I suggested he take torsemide daily for the next several days and then perhaps  every other day.  He continued to be followed by Dr. Vaughan Browner for his interstitial fibrosis and was on Esbriet.  I saw him on May 19, 2021.  He was continuing to see Dr. 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 Unfortunately, his wife died in 12-19-22 from metastatic cancer.  He now lives by himself.  He had recently been started on lisinopril at 2.5 mg by Dr. Theador Hawthorne and at his last evaluation this was increased to 5 mg.  He has noticed fatigability and some lightheadedness since this medication was increased.  He denies chest pain or palpitations.  During that evaluation, blood pressure was low knowing his titration of lisinopril by Dr. Theador Hawthorne to 5 mg and at that time recommended reducing back down to 2.5 mg with follow-up with Dr. Theador Hawthorne.  I last saw him on September 11, 2021.  Since his prior evaluation he had lost approximately 60 pounds since he was first started on Esbriet for his pulmonary fibrosis mainly due to appetite suppression and nausea.  Presently, he is on amlodipine 5 mg, isosorbide 60 mg, lisinopril 2.5 mg, metoprolol tartrate 50 mg in the morning and evening, torsemide 20 mg for blood pressure and his CAD.  He continues to be on DAPT with aspirin/clopidogrel.  He is on Esbriet 801 mg tablets but instead of taking this 3 times a day has only been taking it 2 times per day.    Presently, he continues to be stable.  He does experience shortness of breath with walking.  Continues to be on supplemental oxygen typically 3 L/day.  He denies any significant episodes of chest pain but at times has had a rare short-lived episode with prompt relief.  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  He does experience some lower extremity edema.  Apparently he has not been taking torsemide 20 mg for the last 3 to 4 weeks.  He continues to be followed by Dr. Vaughan Browner for his interstitial fibrosis and also sees Dr. Theador Hawthorne for his stage IIIa CKD.  He presents for 58-month evaluation.  Past Medical History:  Diagnosis Date   Adrenal adenoma, left    small   Aortic atherosclerosis (HCC)    Back pain    Bilateral carotid artery stenosis followed by dr kClaiborne Billings  per last carotid duplex [id number] -- proximal LICA  50-69% ,  RICA 0-49%   CAD (coronary artery disease)    a. s/p prior intervention to RCA and PDA in 1999 b. DES to LAD and distal RCA in 2007   Cardiomegaly    Stable   Chronic constipation    Chronic pain syndrome    CKD (chronic kidney disease), stage III (HCC)    DDD (degenerative disc disease), cervical    DDD (degenerative disc disease), lumbosacral    Dyspnea    Dyspnea on exertion    Gout    per pt last inflared episode 2015 approx.   Grade II diastolic dysfunction    H/O epistaxis    per pt sees ENT dr Benjamine Mola for cauterization (pt takes plavix)   History of acute pancreatitis 08/08/2014   History of kidney stones    HTN (hypertension)    Hyperlipidemia    Interstitial lung disease (HCC)    Left arm pain    s/p fall   Nocturia    Numbness of right foot    due to DDD lumbar   Peripheral edema    Pilonidal cyst    Pleural effusion on right    Pulmonary fibrosis ([place])    followed by pcp-- last chest CT 04-08-2016   S/P drug eluting coronary stent placement 10/11/2005   mLAD and dRCA   Wears dentures    upper   Wears glasses     Past Surgical History:  Procedure Laterality Date   ANTERIOR CERVICAL DECOMP/DISCECTOMY FUSION  12-25-2001     dr Orinda Kenner [facility]   C3 -- C7   ANTERIOR CERVICAL DECOMP/DISCECTOMY FUSION  12-11-2012   dr Carloyn Manner  [facility]   CARDIOVASCULAR STRESS TEST  08-23-2012    dr Claiborne Billings   normal lexiscan nuclear study w/ no ischemia/  normal LV function and wall motion, ef 65%   CARDIOVASCULAR STRESS TEST     CORONARY ANGIOPLASTY  1999   PTCA to RCA & PDA   CORONARY ANGIOPLASTY WITH STENT PLACEMENT  10-11-2005  dr Shelva Majestic   PCI and stenting to [place] (Cypher DES x2)   CORONARY BALLOON  ANGIOPLASTY N/A 04/14/2018   Procedure: CORONARY BALLOON ANGIOPLASTY;  Surgeon: Troy Sine, MD;  Location: [place] [facility];  Service: Cardiovascular;  Laterality: N/A;   CORONARY STENT INTERVENTION N/A 04/14/2018   Procedure: CORONARY STENT INTERVENTION;  Surgeon: Troy Sine, MD;  Location: [place] [facility];  Service: Cardiovascular;  Laterality: N/A;   CYSTOSCOPY W/ URETERAL STENT PLACEMENT Left 02-11-2010     [facility]   LEFT HEART CATH N/A 04/14/2018   Procedure: Left Heart Cath;  Surgeon: Troy Sine, MD;  Location: [place] [facility];  Service: Cardiovascular;  Laterality: N/A;   LEFT HEART CATH AND CORONARY ANGIOGRAPHY N/A 04/12/2018   Procedure: LEFT HEART CATH AND CORONARY ANGIOGRAPHY;  Surgeon: Jettie Booze, MD;  Location: [place] [facility];  Service: Cardiovascular;  Laterality: N/A;   LUMBAR LAMINECTOMY  2015   L5 -- S1   PILONIDAL CYST EXCISION  1980s   PILONIDAL CYST EXCISION N/A 03/31/2017   Procedure: EXCISION OF PILONIDAL DISEASE with Flap Rotation;  Surgeon: Leighton Ruff, MD;  Location: [place];  Service: General;  Laterality: N/A;   [place]   per pt "removal piece of cryloid(?) that was pressing against throat"  states no issues since   TRANSTHORACIC ECHOCARDIOGRAM  08-24-2010  dr Claiborne Billings   ef 50-55%/  mild MR/  trivial TR   WOUND DEBRIDEMENT N/A 10/06/2017   Procedure: DEBRIDEMENT WOUND PLACEMENT OF WOUND HEALING MATRIX;  Surgeon: Leighton Ruff, MD;  Location: [place];  Service: General;  Laterality: N/A;    Allergies  Allergen Reactions   Ibuprofen Itching    Motrin brand   Neosporin [Neomycin-Bacitracin Zn-Polymyx] Swelling   Penicillins Rash     Has patient had a PCN reaction causing immediate rash, facial/tongue/throat swelling, SOB or lightheadedness with hypotension: No Has patient had a PCN reaction causing severe rash involving mucus membranes or skin necrosis: No Has patient had a PCN  reaction that required hospitalization: No Has patient had a PCN reaction occurring within the last 10 years: No If all of the above answers are "NO", then may proceed with Cephalosporin use.     Current Outpatient Medications  Medication Sig Dispense Refill   acetaminophen (TYLENOL) 500 MG tablet Take 1,000 mg by mouth every 6 (six) hours as needed.     allopurinol (ZYLOPRIM) 300 MG tablet Take 300 mg by mouth every morning.      amLODipine (NORVASC) 5 MG tablet Take 1 tablet (5 mg total) by mouth daily. 90 tablet 1   aspirin EC 81 MG tablet Take 81 mg by mouth daily.     atorvastatin (LIPITOR) 80 MG tablet TAKE ONE TABLET (80MG TOTAL) BY MOUTH AT6PM 90 tablet 3   b complex vitamins tablet Take 1 tablet by mouth daily.     benzonatate (TESSALON) 200 MG capsule TAKE ONE CAPSULE (200MG TOTAL) BY MOUTH TWO TIMES DAILY AS NEEDED FOR COUGH (Patient taking differently: Take 200 mg by mouth 2 (two) times daily as needed for cough.) 60 capsule 2   cholecalciferol (VITAMIN D) 1000 units tablet Take 5,000 Units by mouth every morning.      clopidogrel (PLAVIX) 75 MG tablet TAKE ONE (1) TABLET BY MOUTH EVERY DAY 90 tablet 2   ESBRIET 801 MG TABS Take 1 tablet by mouth 3 (three) times daily with meals. 270 tablet 1   isosorbide mononitrate (IMDUR) 60 MG 24 hr tablet TAKE ONE TABLET (60MG TOTAL) BY MOUTH DAILY 90 tablet 0   lisinopril (ZESTRIL) 2.5 MG tablet Take 2.5 mg by mouth daily.     metoprolol tartrate (LOPRESSOR) 50 MG tablet Take 1 tablet (50 mg total) by mouth 2 (two) times daily. 180 tablet 2   naproxen sodium (ALEVE) 220 MG tablet Take 440 mg by mouth.     nitroGLYCERIN (NITROSTAT) 0.4 MG SL tablet Place 1 tablet (0.4 mg total) under the tongue every 5 (five) minutes as needed for chest pain. 25 tablet 3   oxyCODONE-acetaminophen (PERCOCET) 10-325 MG per tablet Take 1 tablet by mouth every 8 (eight) hours as needed for pain.      prochlorperazine (COMPAZINE) 10 MG tablet TAKE ONE TABLET (10MG  TOTAL) BY MOUTH EVERY SIX HOURS AS NEEDED FOR NAUSEA OR VOMITING (Patient taking differently: Take 10 mg by mouth every 6 (six) hours as needed for nausea or vomiting.) 30 tablet 1   tiZANidine (ZANAFLEX) 4 MG tablet Take 4 mg by mouth at bedtime.      torsemide (DEMADEX) 20 MG tablet TAKE ONE TABLET (20MG TOTAL) BY MOUTH DAILY. IF ADDITIONAL SWELLING, MAY TAKE AN ADDITIONAL DOSE IN THE AFTERNOON (Patient taking differently: Take 20 mg by mouth daily. May take an additional dose the afternoon) 180 tablet 1   No current facility-administered medications for this visit.    Socially he is married. There are no  children. Has a remote tobacco history having quit approximately 16 years ago. He's not very active. He does not routinely exercise.  ROS General: Negative; No fevers, chills, or night sweats; weight loss since initiating Esbriet HEENT: Negative; No changes in vision or hearing, sinus congestion, difficulty swallowing Pulmonary: Positive for pulmonary fibrosis Cardiovascular: See history of present illness GI: Negative; No nausea, vomiting, diarrhea, or abdominal pain GU: Negative; No dysuria, hematuria, or difficulty voiding Musculoskeletal: Negative; no myalgias, joint pain, or weakness Hematologic/Oncology: Negative; no easy bruising, bleeding Endocrine: Negative; no heat/cold intolerance; no diabetes Neuro: Negative; no changes in balance, headaches Skin: Negative; No rashes or skin lesions Psychiatric: Negative; No behavioral problems, depression Sleep: Negative; No snoring, daytime sleepiness, hypersomnolence, bruxism, restless legs, hypnogognic hallucinations, no cataplexy Other comprehensive 14 point system review is negative.   PE BP 106/60   Pulse 63   Ht 6' (1.829 m)   Wt 202 lb (91.6 kg)   SpO2 95%   BMI 27.40 kg/m    Blood pressure by me was 104/64.  Wt Readings from Last 3 Encounters:  05/26/22 202 lb (91.6 kg)  05/25/22 202 lb (91.6 kg)  11/09/21 215 lb (97.5  kg)   General: Alert, oriented, no distress.  Skin: normal turgor, no rashes, warm and dry HEENT: Normocephalic, atraumatic. Pupils equal round and reactive to light; sclera anicteric; extraocular muscles intact;  Nose without nasal septal hypertrophy Mouth/Parynx benign; Mallinpatti scale 3 Neck: No JVD, no carotid bruits; normal carotid upstroke Lungs: clear to ausculatation and percussion; no wheezing or rales Chest wall: without tenderness to palpitation Heart: PMI not displaced, RRR, s1 s2 normal, 1/6 systolic murmur, no diastolic murmur, no rubs, gallops, thrills, or heaves Abdomen: soft, nontender; no hepatosplenomehaly, BS+; abdominal aorta nontender and not dilated by palpation. Back: no CVA tenderness Pulses 2+ Musculoskeletal: full range of motion, normal strength, no joint deformities Extremities: 1+ bilateral lower extremity edema; no clubbing cyanosis, Homan's sign negative  Neurologic: grossly nonfocal; Cranial nerves grossly wnl Psychologic: Normal mood and affect    May 26, 2022 ECG (independently read by me):  NSR at 63, NSST changes  September 11, 2021 ECG (independently read by me):  NSR at 67, no ectopy, normal intervas  May 19, 2021 ECG (independently read by me):  NSR at 71, no ectopy  August 19, 2020 ECG (independently read by me): Sinus rhythm at 67, sinus arrhythmia with low atrial atrial ectopy    September 2021 ECG (independently read by me): NSR 62; no STT changes; normal intervals  May 2021 ECG (independently read by me): NSR at 66; no ST changes; normal intervals  November 2020 ECG (independently read by me): Normal sinus rhythm at 75 bpm.  No ectopy.  Normal intervals  March 2019 ECG (independently read by me): Bradycardia 57 bpm.  No ectopy.  Normal intervals.  February 2018 ECG (independently read by me): Sinus bradycardia 57 bpm.  No ectopy.  Normal intervals.  January 2018 ECG (independently read by me): Sinus rhythm at 60 bpm.  No  ectopy.  Normal intervals.  June 2017 ECG (independently read by me): Normal sinus rhythm at 61 bpm.  No ectopy.  Normal intervals.  April 2016 ECG (independently read by me): Normal sinus rhythm at 67 bpm.  No ectopy.  Normal intervals.  October 2014 ECG: Normal sinus rhythm at 63 beats per minute. No significant ST abnormalities. Normal intervals.  LABS:     Latest Ref Rng & Units 12/10/2021    8:58 AM 11/09/2021  9:40 AM 05/21/2021    2:11 PM  BMP  Glucose 70 - 99 mg/dL 85  109  109   BUN 6 - 23 mg/dL _0 Creatinine 0.40 - 1.50 mg/dL 1.21  1.21  1.16   Sodium 135 - 145 mEq/L 141  141  138   Potassium 3.5 - 5.1 mEq/L 4.2  4.2  3.8   Chloride 96 - 112 mEq/L 104  103  102   CO2 19 - 32 mEq/L 31  33  29   Calcium 8.4 - 10.5 mg/dL 10.0  9.6  9.3        Latest Ref Rng & Units 05/25/2022   11:21 AM 12/10/2021    8:58 AM 11/09/2021    9:40 AM  Hepatic Function  Total Protein 6.0 - 8.3 g/dL 6.8  7.3  6.9   Albumin 3.5 - 5.2 g/dL 3.9  4.1  4.0   AST 0 - 37 U/L _1 ALT 0 - 53 U/L _2 Alk Phosphatase 39 - 117 U/L 113  98  92   Total Bilirubin 0.2 - 1.2 mg/dL 0.9  1.3  1.4   Bilirubin, Direct 0.0 - 0.3 mg/dL 0.2         Latest Ref Rng & Units 05/21/2021    2:11 PM 02/26/2021    5:22 PM 01/14/2021    2:19 AM  CBC  WBC 4.0 - 10.5 K/uL 10.2  9.5  13.9   Hemoglobin 13.0 - 17.0 g/dL 15.5  16.1  16.0   Hematocrit 39.0 - 52.0 % 45.8  49.4  48.8   Platelets 150.0 - 400.0 K/uL 294.0  219  231    Lab Results  Component Value Date   MCV 88.5 05/21/2021   MCV 91.8 02/26/2021   MCV 91.6 01/14/2021    Lab Results  Component Value Date   TSH 1.040 08/25/2020   Lipid Panel     Component Value Date/Time   CHOL 119 08/25/2020 0754   TRIG 142 08/25/2020 0754   HDL 44 08/25/2020 0754   CHOLHDL 2.7 08/25/2020 0754   CHOLHDL 4.4 04/13/2018 0513   VLDL 27 04/13/2018 0513   LDLCALC 50 08/25/2020 0754     RADIOLOGY: No results found.    Other studies  Reviewed: [facility] 04/14/2018: Conclusion      Mid RCA lesion is 65% stenosed. Previously placed Dist RCA-1 stent (unknown type) is widely patent. Dist RCA-2 lesion is 99% stenosed. Ost RPDA to RPDA lesion is 95% stenosed. Ost 1st Mrg to 1st Mrg lesion is 80% stenosed. Prox Cx lesion is 80% stenosed. Prox LAD to Mid LAD lesion is 10% stenosed. Mid LAD to Dist LAD lesion is 80% stenosed. Dist LAD lesion is 90% stenosed. Post intervention, there is a 20% residual stenosis. Post intervention, there is a 15% residual stenosis. Post intervention, there is a 0% residual stenosis. A stent was successfully placed. Post intervention, there is a 0% residual stenosis. Post intervention, there is a 0% residual stenosis. Mid LAD lesion is 80% stenosed. A stent was successfully placed. Post intervention, there is a 0% residual stenosis. Post intervention, there is a 0% residual stenosis. A stent was successfully placed.   Successful multilesion/multivessel PCI to the RCA and LAD vessels.   Cutting Balloon/PTCA to the mid PDA vessel of the RCA with a 95% stenosis being reduced to 15%, and Cutting Balloon/PTCA/DES stenting with a 2.5 x  12 mm Resolute Onyx DES stent inserted in the distal RCA immediately proximal to the takeoff of the PDA vessel with stent overlap to the previously placed stent and postdilatation up to 2.75 mm with the 99% stenosis being reduced to 0%.   Three lesion intervention to the LAD with PTCA of the apical 90% LAD stenosis reduced to less than 30%, PTCA/DES stenting with a 3.0 x 22 mm Resolute Onyx DES stent postdilated to 3.25 mm with the 80% stenosis being reduced to 0% in the 80% stenosis just distal to the old LAD stent treated with PTCA/DES stenting with a 3.0 x 18 mm Resolute Onyx DES stent postdilated 3.3 mm with the 80% stenosis being reduced to 0%.   LVEDP preintervention was 15 mmHg.   RECOMMENDATION: The patient will be being maintained on dual antiplatelet therapy  probably indefinitely due to significant multi-vessel disease and  intervention.  Increase medical therapy for concomitant CAD.  Patient will be hydrated post procedure.  Consider discharge over the weekend on increased medical therapy.  If recurrent symptoms develop consider staged PCI to his circumflex marginal and AV groove circumflex vessels.  __________   [facility] 04/12/2018: Conclusion      The left ventricular ejection fraction is 50-55% by visual estimate. The left ventricular systolic function is normal. LV end diastolic pressure is mildly elevated. LVEDP 16 mm Hg. There is no aortic valve stenosis. Previously placed Dist RCA-1 stent (unknown type) is widely patent. Dist RCA-2 lesion is 95% stenosed. RPDA lesion is 80% stenosed. Prox Cx lesion is 80% stenosed. Ost 1st Mrg to 1st Mrg lesion is 80% stenosed. Previously placed Mid LAD-1 stent (unknown type) is widely patent. Mid LAD-2 lesion is 75% stenosed. Ost 2nd Diag to 2nd Diag lesion is 75% stenosed. Dist LAD lesion is 75% stenosed. Ost 1st Diag lesion is 50% stenosed. Prox LAD lesion is 25% stenosed.   Multivessel CAD.  Plan for cardiac surgery consult.  He does have diffuse distal LAD disease which is not optimal for LIMA to LAD.  If he is not a candidate for CABG, would plan to bring back for PCI.          IMPRESSION:  1. Essential hypertension   2. Coronary artery disease involving native coronary artery of native heart with angina pectoris ([place])   3. Pulmonary fibrosis ([place])   4. Bilateral lower extremity edema   5. Hyperlipidemia LDL goal <70   6. Stage 3a chronic kidney disease ([place])      ASSESSMENT AND PLAN: Mr. Chenier is a 76 year-old Caucasian male who is almost 25 years since his initial intervention to his RCA and PDA and 17 years since stenting to his LAD and distal RCA.  He has a history of hypertension.   Due to his significant occupational exposure, remote tobacco, and abnormal PFTs on CT imaging he was   felt most likely to have interstitial lung disease .  In 2019 he developed recurrent unstable anginal symptomatology and was found to have progressive multivessel CAD.  He was turned down for CABG revascularization.  He underwent complex multi lesion multivessel PCI by me on April 14, 2018 at several sites in his RCA and LAD.  At the time he also had concomitant circumflex and circumflex and marginal disease which was not intervened upon.  He had felt significantly improved with reference to symptoms but when evaluated in November 2020 he had recurrent chest pain and increased shortness of breath.  He is now followed by  Dr. Vaughan Browner of pulmonary for pulmonary fibrosis and is on Esbriet.  He has lost approximately 60 pounds of weight since being on Esbriet with appetite suppression and periods of nausea and fatigability  .A follow-up echo Doppler study in December 2022 showed  EF was 60 to 65% with  improvement in his pulmonary hypertension with normal pulmonary artery systolic pressure.  BP today is stable on current therapy consisting of amlodipine 5 mg, isosorbide 60 mg, lisinopril 2.5 mg, and metoprolol tartrate 50 mg twice a day.  He continues to be on DAPT with aspirin/Plavix.  He is on Esbriet 801 mg tablets 3 times daily.  He takes allopurinol 300 mg daily with history of gout.  He continues to be on atorvastatin 80 mg for hyperlipidemia.  Recent laboratory showed normal LFTs.  NT proBNP was normal at 145 on May 25, 2022 improved from 11/09/2021 at 754.  Most recent lipid studies showed LDL cholesterol at 50.  He is scheduled to undergo a follow-up 2D echo Doppler study by Dr. Vaughan Browner on June 25, 2022.  I have suggested he with his lower extremity edema and try taking 20 mg twice a week to see if this can prevent recurrence once resolved.  I will see him in 6 months for reevaluation or sooner as needed.     Troy Sine, MD, [facility]  05/26/2022 5:55 PM

## 2022-05-26 NOTE — Telephone Encounter (Signed)
Latest Ref Rng & Units 05/25/2022   11:21 AM 12/10/2021    8:58 AM 11/09/2021    9:40 AM  Hepatic Function  Total Protein 6.0 - 8.3 g/dL 6.8  7.3  6.9   Albumin 3.5 - 5.2 g/dL 3.9  4.1  4.0   AST 0 - 37 U/L _0 ALT 0 - 53 U/L _1 Alk Phosphatase 39 - 117 U/L 113  98  92   Total Bilirubin 0.2 - 1.2 mg/dL 0.9  1.3  1.4   Bilirubin, Direct 0.0 - 0.3 mg/dL 0.2      LFTs from 05/25/2022 wnl  Rx for Esbriet sent to [place], PharmD, MPH, BCPS, CPP Clinical Pharmacist (Rheumatology and Pulmonology)

## 2022-05-26 NOTE — Patient Instructions (Signed)
Medication Instructions:  Your physician recommends that you continue on your current medications as directed. Please refer to the Current Medication list given to you today.  Please try to take your torsemide twice a week  *If you need a refill on your cardiac medications before your next appointment, please call your pharmacy*   Lab Work: NONE If you have labs (blood work) drawn today and your tests are completely normal, you will receive your results only by: Why (if you have MyChart) OR A paper copy in the mail If you have any lab test that is abnormal or we need to change your treatment, we will call you to review the results.   Testing/Procedures: NONE   Follow-Up: At [facility], you and your health needs are our priority.  As part of our continuing mission to provide you with exceptional heart care, we have created designated Provider Care Teams.  These Care Teams include your primary Cardiologist (physician) and Advanced Practice Providers (APPs -  Physician Assistants and Nurse Practitioners) who all work together to provide you with the care you need, when you need it.  We recommend signing up for the patient portal called "MyChart".  Sign up information is provided on this After Visit Summary.  MyChart is used to connect with patients for Virtual Visits (Telemedicine).  Patients are able to view lab/test results, encounter notes, upcoming appointments, etc.  Non-urgent messages can be sent to your provider as well.   To learn more about what you can do with MyChart, go to [email or web address].    Your next appointment:   6 month(s)  The format for your next appointment:   In Person  Provider:   Shelva Majestic, MD

## 2022-05-31 ENCOUNTER — Other Ambulatory Visit: Payer: Self-pay | Admitting: Cardiovascular Disease

## 2022-05-31 DIAGNOSIS — N1831 Chronic kidney disease, stage 3a: Secondary | ICD-10-CM | POA: Diagnosis not present

## 2022-05-31 DIAGNOSIS — I5032 Chronic diastolic (congestive) heart failure: Secondary | ICD-10-CM | POA: Diagnosis not present

## 2022-05-31 DIAGNOSIS — I129 Hypertensive chronic kidney disease with stage 1 through stage 4 chronic kidney disease, or unspecified chronic kidney disease: Secondary | ICD-10-CM | POA: Diagnosis not present

## 2022-05-31 DIAGNOSIS — R809 Proteinuria, unspecified: Secondary | ICD-10-CM | POA: Diagnosis not present

## 2022-06-25 ENCOUNTER — Other Ambulatory Visit (HOSPITAL_COMMUNITY): Payer: Medicare Other

## 2022-06-30 ENCOUNTER — Ambulatory Visit (HOSPITAL_COMMUNITY): Payer: Medicare Other | Attending: Pulmonary Disease

## 2022-06-30 DIAGNOSIS — R0602 Shortness of breath: Secondary | ICD-10-CM | POA: Diagnosis not present

## 2022-06-30 LAB — ECHOCARDIOGRAM COMPLETE
Area-P 1/2: 3.27 cm2
S' Lateral: 2.6 cm

## 2022-07-26 ENCOUNTER — Other Ambulatory Visit: Payer: Self-pay | Admitting: Cardiovascular Disease

## 2022-08-03 ENCOUNTER — Telehealth: Payer: Self-pay | Admitting: Pulmonary Disease

## 2022-08-03 DIAGNOSIS — J84112 Idiopathic pulmonary fibrosis: Secondary | ICD-10-CM

## 2022-08-06 ENCOUNTER — Other Ambulatory Visit (HOSPITAL_COMMUNITY): Payer: Self-pay

## 2022-08-06 MED ORDER — ESBRIET 801 MG PO TABS: 1.0000 | ORAL_TABLET | Freq: Three times a day (TID) | ORAL | 1 refills | Status: DC

## 2022-08-06 NOTE — Telephone Encounter (Signed)
Returned call to [place] and confirmed that this is simply in regards to renewing his ongoing eligibility for 2024. Per rep, pt has also not requested a refill since early December.  During OV on 05/25/22 Dr. Vaughan Browner discussed lowering pt's dose down to twice daily in order to better manage nausea and fatigue side effects, however a refill was sent in on 05/26/22 for the full 90 tablets and therefore delaying the need for a refill. Rep confirmed that we would need to send in a new Rx with the updated dosing information.  Additionally advised that there has been no change in pt's insurance and the rep verified that they had the pt's financial info already on file from where I renewed the pt's consent form back in November.  Pt retains eligibility for PAP during 2024, will route to [facility] for assistance having an updated Rx sent in to reflect the twice daily dosing.

## 2022-08-06 NOTE — Telephone Encounter (Signed)
Refill sent for ESBRIET to [facility] ([facility]) for Esbriet: [phone]  Dose: 801 mg three times daily (heis sometimes taking three times daily but infrequently, mostly taking twice daily)  Last OV: 05/25/2022 Provider: Dr. Vaughan Browner  Next OV: 4 months (not yet scheduled), HRCT scheduled for 08/31/2022  LFTs on 05/25/2022 wnl  Knox Saliva, PharmD, MPH, BCPS Clinical Pharmacist (Rheumatology and Pulmonology)

## 2022-08-19 ENCOUNTER — Encounter: Payer: Self-pay | Admitting: Radiology

## 2022-08-26 ENCOUNTER — Other Ambulatory Visit: Payer: Self-pay | Admitting: *Deleted

## 2022-08-26 MED ORDER — ISOSORBIDE MONONITRATE ER 60 MG PO TB24: ORAL_TABLET | ORAL | 3 refills | Status: DC

## 2022-08-31 ENCOUNTER — Ambulatory Visit (HOSPITAL_COMMUNITY)
Admission: RE | Admit: 2022-08-31 | Discharge: 2022-08-31 | Disposition: A | Discharge status: Home / Self Care | Payer: Medicare Other | Source: Ambulatory Visit | Attending: Internal Medicine | Admitting: Internal Medicine

## 2022-08-31 DIAGNOSIS — J84112 Idiopathic pulmonary fibrosis: Secondary | ICD-10-CM | POA: Insufficient documentation

## 2022-09-08 ENCOUNTER — Ambulatory Visit (INDEPENDENT_AMBULATORY_CARE_PROVIDER_SITE_OTHER): Payer: Medicare Other | Admitting: Pulmonary Disease

## 2022-09-08 ENCOUNTER — Encounter: Payer: Self-pay | Admitting: Pulmonary Disease

## 2022-09-08 VITALS — BP 138/76 | HR 64 | Temp 97.9°F | Ht 73.0 in | Wt 193.0 lb

## 2022-09-08 DIAGNOSIS — Z5181 Encounter for therapeutic drug level monitoring: Secondary | ICD-10-CM

## 2022-09-08 DIAGNOSIS — J84112 Idiopathic pulmonary fibrosis: Secondary | ICD-10-CM

## 2022-09-08 DIAGNOSIS — I272 Pulmonary hypertension, unspecified: Secondary | ICD-10-CM | POA: Diagnosis not present

## 2022-09-08 NOTE — Patient Instructions (Signed)
I am glad you are stable with your breathing Continue the Esbriet  The next time you labs drawn by your kidney doctor can you make sure that we also get a hepatic panel or liver panel Your echocardiogram shows a condition called pulmonary hypertension.  I will discuss with Dr. Claiborne Billings if he can perform a procedure called right heart catheterization  Follow-up in 3 months.

## 2022-09-08 NOTE — Progress Notes (Signed)
Maurice Munoz    QN:[id number]    1946-03-10  Primary Care Physician:Fusco, Purcell Nails, MD  Referring Physician: [employer] [employer], [address] [place],  [place] [address]  Chief complaint: Follow-up for Idiopathic pulmonary fibrosis [name] January 2021  HPI: 77 y.o.  with history of coronary artery disease, pulmonary fibrosis.  Previously followed by [name].  Prior CT scan shows pulmonary fibrosis which is thought to be secondary to occupational exposure Referred back for worsening dyspnea Discussed at multidisciplinary conference on 07/03/19 with diagnosis of IPF and started on Esbriet  Patient has established coronary artery disease status post multiple stenting in 2019.  He also has residual circumflex disease that was not intervened upon.  Pets: Dog.  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  No ongoing exposures.  No mold, hot tub, Jacuzzi or down pillows or comforters Smoking history: 75-pack-year smoker.  Quit in 1990 Travel history: No significant travel history Relevant family history: Father died of lung cancer.  He was a smoker.  Interim history: Continues on Esbriet.  He takes just 2 pills a day on most days since he skips lunch Continues on supplemental oxygen.  Breathing is stable  Here for review of CT and echocardiogram.  Outpatient Encounter Medications as of 09/08/2022  Medication Sig   acetaminophen (TYLENOL) 500 MG tablet Take 1,000 mg by mouth every 6 (six) hours as needed.   allopurinol (ZYLOPRIM) 300 MG tablet Take 300 mg by mouth every morning.    amLODipine (NORVASC) 5 MG tablet TAKE ONE TABLET (5MG  TOTAL) BY MOUTH DAILY.   aspirin EC 81 MG tablet Take 81 mg by mouth daily.   atorvastatin (LIPITOR) 80 MG tablet TAKE ONE TABLET (80MG  TOTAL) BY MOUTH AT6PM   b complex vitamins tablet Take 1  tablet by mouth daily.   cholecalciferol (VITAMIN D) 1000 units tablet Take 5,000 Units by mouth every morning.    clopidogrel (PLAVIX) 75 MG tablet TAKE ONE (1) TABLET BY MOUTH EVERY DAY   ESBRIET 801 MG TABS Take 1 tablet (801 mg total) by mouth 3 (three) times daily with meals.   isosorbide mononitrate (IMDUR) 60 MG 24 hr tablet TAKE ONE TABLET (60MG  TOTAL) BY MOUTH DAILY   lisinopril (ZESTRIL) 2.5 MG tablet TAKE ONE TABLET (2.5MG  TOTAL) BY MOUTH DAILY   metoprolol tartrate (LOPRESSOR) 50 MG tablet Take 1 tablet (50 mg total) by mouth 2 (two) times daily.   naproxen sodium (ALEVE) 220 MG tablet Take 440 mg by mouth.   oxyCODONE-acetaminophen (PERCOCET) 10-325 MG per tablet Take 1 tablet by mouth every 8 (eight) hours as needed for pain.    prochlorperazine (COMPAZINE) 10 MG tablet TAKE ONE TABLET (10MG  TOTAL) BY MOUTH EVERY SIX HOURS AS NEEDED FOR NAUSEA OR VOMITING (Patient taking differently: Take 10 mg by mouth every 6 (six) hours as needed for nausea or vomiting.)   tiZANidine (ZANAFLEX) 4 MG tablet Take 4 mg by mouth at bedtime.    torsemide (DEMADEX) 20 MG tablet TAKE ONE TABLET (20MG  TOTAL) BY MOUTH DAILY. IF ADDITIONAL SWELLING, MAY TAKE AN ADDITIONAL DOSE IN THE AFTERNOON (Patient taking differently: Take 20 mg by mouth daily. May take an additional dose the afternoon)   benzonatate (TESSALON) 200 MG capsule TAKE ONE CAPSULE (200MG  TOTAL) BY MOUTH TWO TIMES DAILY AS NEEDED FOR COUGH (Patient not taking: Reported on  09/08/2022)   nitroGLYCERIN (NITROSTAT) 0.4 MG SL tablet Place 1 tablet (0.4 mg total) under the tongue every 5 (five) minutes as needed for chest pain.   No facility-administered encounter medications on file as of 09/08/2022.   Physical Exam: Blood pressure 138/76, pulse 64, temperature 97.9 F (36.6 C), temperature source Oral, height 6\' 1"  (1.854 m), weight 193 lb (87.5 kg), SpO2 95 %. Gen:      No acute distress HEENT:  EOMI, sclera anicteric Neck:     No masses; no  thyromegaly Lungs:    Bibasal crackles CV:         Regular rate and rhythm; no murmurs Abd:      + bowel sounds; soft, non-tender; no palpable masses, no distension Ext:    No edema; adequate peripheral perfusion Skin:      Warm and dry; no rash Neuro: alert and oriented x 3 Psych: normal mood and affect   Data Reviewed: Imaging: CT high-resolution February 05, 2015 patchy areas of groundglass attenuation, septal thickening with mild basal gradient. CT chest high-resolution 05/09/2016-subpleural reticulation, mild traction bronchiolectasis, mild honeycombing.  Definite UIP pattern high-resolution  CT chest high-resolution 04/24/2019-progression of fibrotic lung lung disease and definite UIP pattern. CT high-resolution 08/20/2021-progression of UIP pattern pulmonary fibrosis. CT high-resolution 08/31/2022-possible mild progression of UIP pattern interstitial lung disease, enlarged PA I have reviewed the images personally.  PFTs: 04/29/2017 FVC 2.66 [55%], FEV1 2.35 [66%], F/F 88, TLC 4.10 [55%], DLCO 15.60 [44%] Moderate restriction with severe diffusion defect.  Worsening lung function compared to 2016.  05/28/2019 FVC 2.34 [49%], FEV1 1.96 [56%], F/F 84, TLC 3.50 [47%], DLCO 12.72 [46%] Severe restriction, diffusion defect.  Worsening lung volumes and diffusion capacity compared to 2008  09/19/2020 FVC 2.18 [46%), FEV1 1.87 (54%], F/F 86, TLC 3.33 [44%], DLCO 12.05 [44%] Severe restriction, diffusion defect with worsening  06/23/2021 FVC 2.18 [46%], FEV1 1.54 [45%], F/F 71, TLC 3.53 [47%], DLCO 6.05 [22%] Severe restriction, severe diffusion defect  Labs: CTD serologies 02/23/2017-negative ANA, Jo 1, centromere antibody, rheumatoid factor, Ro, La, SCL 70  Repeat CTD serologies-negative ANA, CCP CK, aldolase, myositis panel, SCL 70, Ro, La Rheumatoid factor 16  Hepatic panel 08/25/2020-within normal limits  Cardiac: Echocardiogram 06/10/2021 LVEF 60 to 65%, mild concentric LVH, normal PA  systolic pressure.  RVSP 31.4  Echocardiogram 06/30/2022 LVEF 60 to [id number], grade 1 diastolic dysfunction, mildly reduced RV systolic function.  Moderate pulmonary hypertension, RVSP 58.4  Assessment:  Idiopathic pulmonary fibrosis He has 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  Although there is some occupational exposures he has not had ongoing exposure since 2000.  He does not have signs and symptoms of connective tissue disease.  CTD serologies significant for borderline rheumatoid factor which is nonsignificant  Discussed at multidisciplinary conference with a diagnosis of IPF Continue Esbriet at a reduced dose of twice daily.  Has occasional nausea and fatigue with the med We discussed changing to Ofev but he does not want to try it due to side effects of diarrhea Zofran as needed Check labs for monitoring.  He will let us nephrologist know to get CMP the next time that his blood draws  Evaluation for pulmonary hypertension Enlarged PA noted on CT scan.  However no evidence of pulmonary hypertension on echocardiogram 1 year ago.   Repeat echocardiogram shows moderate pulmonary hypertension now We will discuss with Dr. Claiborne Billings his cardiologist and arrange a right heart cath  Plan/Recommendations: - Continue esbriet.   - Labs - Right  heart catheterization   Marshell Garfinkel MD [facility] 09/08/2022, 11:13 AM  CC: [employer] [employer], MD

## 2022-09-27 ENCOUNTER — Telehealth: Payer: Self-pay | Admitting: Cardiovascular Disease

## 2022-09-27 NOTE — Telephone Encounter (Signed)
Pt c/o of Chest Pain: STAT if CP now or developed within 24 hours  1. Are you having CP right now? Not right at this moment   2. Are you experiencing any other symptoms (ex. SOB, nausea, vomiting, sweating)? Pt is on oxygen but has been having sob - can't hardly walk to the bathroom. States he uses a walker to get around. Pt states that 3/20, he had a bad spell with burning in his chest as he was leaving church. He states that when it was burning, he thought he was having a heart attack but by the time he got home and settled, it went away. He states it got better about 30 mins later. He has been having nausea as well. States the nausea comes from the medication he takes for his lungs. Pt states that hes had little episodes of cp, but nothing as bad as the other week. His last one was a couple days ago. It also comes and goes. Not a real sharp pain, but enough to think something might happen.   3. How long have you been experiencing CP? Off and on. States he has had chest pain before, when he had his stents put in.   4. Is your CP continuous or coming and going? Comes and goes.  5. Have you taken Nitroglycerin? Did not take any nitro during his episode from a week ago when he was burning, nor his little episodes of cp.   Pt feels like he needs to be seen. Pt is scheduled for his 6 mo f/u on 5/2 with Gavin Pound PA. Added to the waitlist for NL APP's. ?

## 2022-09-27 NOTE — Telephone Encounter (Signed)
Pt states that he has been sob for a long time; he is on oxygen at home and has seen Pulmonologist 09/08/22.  Reports that he has been having some chest pains on and off, the worst episode was 09/08/22. Since then, he has noticed some chest pain on and off- seems that the chest pain is during "activity," but he doesn't even do much activity- only walks short distances/like to the bathroom and around the house.   Says 09/08/22 it was burning, has not been that bad since then.  Had 7 stents placed 2019. He says the pain is not as bad as what it was in 2019. He did not take NTG on 09/08/22, but feels he probably should have.   Found an opening for tomorrow 09/28/22 at 1000 with Dr Tresa Endo. Gave pt NTG information and ER precautions. He verbalized understanding.

## 2022-09-28 ENCOUNTER — Ambulatory Visit: Payer: Medicare Other | Attending: Cardiovascular Disease | Admitting: Cardiovascular Disease

## 2022-09-28 ENCOUNTER — Encounter: Payer: Self-pay | Admitting: Cardiovascular Disease

## 2022-09-28 ENCOUNTER — Other Ambulatory Visit: Payer: Self-pay | Admitting: *Deleted

## 2022-09-28 VITALS — BP 116/72 | HR 101 | Ht 73.0 in | Wt 183.0 lb

## 2022-09-28 DIAGNOSIS — I25119 Atherosclerotic heart disease of native coronary artery with unspecified angina pectoris: Secondary | ICD-10-CM | POA: Diagnosis present

## 2022-09-28 DIAGNOSIS — I272 Pulmonary hypertension, unspecified: Secondary | ICD-10-CM

## 2022-09-28 DIAGNOSIS — I10 Essential (primary) hypertension: Secondary | ICD-10-CM | POA: Diagnosis present

## 2022-09-28 DIAGNOSIS — N1831 Chronic kidney disease, stage 3a: Secondary | ICD-10-CM | POA: Insufficient documentation

## 2022-09-28 DIAGNOSIS — J8410 Pulmonary fibrosis, unspecified: Secondary | ICD-10-CM | POA: Diagnosis present

## 2022-09-28 DIAGNOSIS — E785 Hyperlipidemia, unspecified: Secondary | ICD-10-CM | POA: Diagnosis present

## 2022-09-28 MED ORDER — METOPROLOL TARTRATE 50 MG PO TABS: 75.0000 mg | ORAL_TABLET | Freq: Two times a day (BID) | ORAL | 3 refills | Status: DC

## 2022-09-28 MED ORDER — ISOSORBIDE MONONITRATE ER 60 MG PO TB24: ORAL_TABLET | ORAL | 3 refills | Status: DC

## 2022-09-28 MED ORDER — SODIUM CHLORIDE 0.9% FLUSH: 3.0000 mL | Freq: Two times a day (BID) | INTRAVENOUS | Status: DC

## 2022-09-28 NOTE — Progress Notes (Signed)
Order placed for cardiac cath -- right and left

## 2022-09-28 NOTE — Progress Notes (Addendum)
Patient ID: Maurice Munoz, male   DOB: [birth year], 77 y.o.   MRN: [id number]        HPI: Maurice Munoz, is a 77 y.o. male who presents to the office today for a 5 month cardiology followup evaluation.     [name] has established CAD dating back to 1999 when he underwent intervention to his RCA and PDA. In April 2007 he underwent stenting to his LAD and distal RCA at which time Cypher DES stents were inserted. He has a history of hypertension, obesity, hyperlipidemia, gout, and  lower extremity edema.   A two-year followup nuclear perfusion study in March 2014 was essentially unchanged from his prior study and continued to show fairly normal perfusion and was low risk, without evidence for scar. There was no significant ischemia.  In 2015 he had an injury to his right leg.  He had noted some intermittent leg swelling.  He had carotids Doppler study in January 2016 which showed 50-60% left proximal ICA diameter reduction, which had slightly increased from his prior study one year previously.  Because of a history of coughing up blood with pleuritic chest pain he underwent a CT Ango of his chest on March 21 which was negative for PE but showed diffuse peripheral lung densities throughout both upper and lower zones without honeycombing raising the [place] Ana concern for mild fibrotic changes but the pattern was nonspecific.    Over the last several years he has lost over 40 pounds from his peak weight.  He  was seen in the emergency room with lightheadedness and was hypotensive.  He was told to discontinue his isosorbide as well as lisinopril.   In the setting of his hypotension he was found to have an increased creatinine at 1.4 recently had been 1.1. He has been followed by Dr.Befakadu in [place] for his renal issues.  Over the past year, he has experienced occasional episodes of chest pain which he describes as tightness.  Typically, this can occur with exertion.  At times he also notices  a chest burning.  He admits to shortness of breath.  He has been evaluated by Dr. Kendrick Fries for pulmonary fibrosis.  He has an extensive occupational history infrequently had worked in Administrator, sports, Chartered certified accountant, and chrome plating for many years and was often exposed to significant dust inhalation.  He also had worked in Pensions consultant.  There is remote tobacco history of 2-3 packs per day for 20-25 years, but unfortunately he quit in 1980s.  He is felt most likely to have interstitial lung disease and continues further evaluation.  He has had difficulty with his low back.  He has been on lisinopril 5 mg, metoprolol 50 mg twice a day.  He continues to take simvastatin 20 mg for hyperlipidemia.  He is been maintained on aspirin and Plavix.  There is remote history of gout, which is controlled with allopurinol.  When I saw him on February 2019, he had noticed episodes of chest burning as well as chest tightness and continues to experience exertional dyspnea.  I initiated amlodipine at 5 mg.  I strongly recommended support stockings for his potential edema.  He was given a prescription for sublabel nitroglycerin.  He underwent a follow-up Lexiscan Myoview study of 08/12/2017 which was low risk and without ischemia.  EF was 57%.  There was minimal apical thinning most likely contributed by his body habitus.  He underwent an echo Doppler study on 10/14/2017 which showed an EF of 60-65% and  grade 2 diastolic dysfunction.  There was moderate pulmonary hypertension with PA pressure at 46 mm.    At evaluation in March 2019 he was feeling better.  He denied recurrent chest pain or shortness of breath.  He is lost 21 pounds in 3 weeks purposefully.   He developed recurrent chest pain symptomatology and developed unstable angina symptoms leading to hospitalization with a non-ST segment elevation MI.  On April 12, 2018 he underwent diagnostic catheterization by Dr. Irish Lack due to multivessel CAD he underwent surgical consultation for  consideration of CABG surgery but was turned down.  As result, on April 14, 2018, I performed successful multi lesion/multivessel PCI to the RCA and LAD vessels.  He underwent Cutting Balloon PTCA to the mid PDA vessel of the RCA with a 95% stenosis being reduced to 15% and Cutting Balloon PTCA DES stenting with a 2.5 x12 mm Resolute stent inserted into the distal RCA immediately proximal to the takeoff of the PDA vessel with stent overlap to the previously placed stent with a 99% stenosis reduced to 0%.  He also underwent 3 lesion intervention to the LAD with PTCA of the 90% apical LAD stenosis, PTCA DES stenting at 2 additional study sites in the LAD successfully.  He still had residual concomitant 80% AV groove circumflex and circumflex marginal disease which was treated medically.  Maurice Munoz has noted significant improvement following his multilesion multivessel intervention to his RCA and LAD.  He has been seen in the office since by Maurice Sims, NP as well as Maurice Deforest, PA.  When last seen by Maurice Munoz, he reduced his amlodipine since his blood pressure was low and he also had 2+ pitting edema for which he recommended restarting his previous torsemide daily.  I saw him in November 2020.  He underwent an echo Doppler study 24 2020 which showed normal systolic with EF at 60 to 65%, mild LVH diastolic parameters were indeterminate.  He had moderately elevated pulmonary artery systolic pressure at 42 mm.    He was  evaluated by me on Nov 16, 2019.  He had done well since his last evaluation but over the past 2 weeks has again begun to notice some recurrent episodes of chest pain and shortness of breath.  He also has been diagnosed with pulmonary fibrosis.  He continues to experience mild leg swelling.  During that evaluation I recommended initiating isosorbide mononitrate 30 mg.  He had been on low-dose amlodipine at 2.5 mg and I recommended further titration of 5 mg.  He continues to be on metoprolol  tartrate 25 mg twice a day and remained on DAPT with aspirin/Plavix.   He was to follow-up with Dr. Marshell Garfinkel for his pulmonary fibrosis.  He has been started on Esbriet for his pulmonary fibrosis.  He has noticed leg swelling bilaterally left greater than right.  Approximately 2 weeks ago he took sublingual nitroglycerin with improvement of his mild chest pain.  During that evaluation, he was on amlodipine 5 mg and I recommended further titration of isosorbide to 60 mg daily and that he continue metoprolol tartrate 50 mg twice a day.  He had 2+ bilateral leg swelling and I recommended torsemide 20 mg daily and support stockings.  I saw him in September 2021 and over the prior several months he had remained fairly stable and denied recurrent chest pain on his increased medical regimen.  He continues to be on supplemental oxygen for his pulmonary fibrosis.  He was continuing to experience some  leg swelling right greater than left but had not been using support stockings.  During that evaluation I again recommended he use support stockings.  When I saw him on August 19, 2020 he was continuing to experience leg swelling and was taking torsemide 2 times per week.  He has noticed an increased heart rate with activity.  He has been on amlodipine 5 mg, isosorbide 60 mg, metoprolol 50 mg twice a day in addition to torsemide.  He continues to be on DAPT with aspirin/Plavix.  He is tolerating atorvastatin 80 mg.  Laboratory was checked at [facility] in July 2021 and lipids were excellent.  Recently he was sleeping in a recliner.  He has not had recent laboratory.  During that evaluation, he denied recurrent anginal symptoms but he noticed very quick increase in his heart rate.  I suggested slight titration of metoprolol to tartrate from 50 mg twice a day to 75 mg in the morning and 50 mg at night.  He had 2+ lower extremity edema and I suggested he take torsemide daily for the next several days and then perhaps  every other day.  He continued to be followed by Dr. Isaiah Serge for his interstitial fibrosis and was on Esbriet.  I saw him on May 19, 2021.  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 Unfortunately, his wife died in 2022-12-16 from metastatic cancer.  He now lives by himself.  He had recently been started on lisinopril at 2.5 mg by Dr. Wolfgang Phoenix and at his last evaluation this was increased to 5 mg.  He has noticed fatigability and some lightheadedness since this medication was increased.  He denies chest pain or palpitations.  During that evaluation, blood pressure was low knowing his titration of lisinopril by Dr. Wolfgang Phoenix to 5 mg and at that time recommended reducing back down to 2.5 mg with follow-up with Dr. Wolfgang Phoenix.  I saw him on September 11, 2021.  Since his prior evaluation he had lost approximately 60 pounds since he was first started on Esbriet for his pulmonary fibrosis mainly due to appetite suppression and nausea.  Presently, he is on amlodipine 5 mg, isosorbide 60 mg, lisinopril 2.5 mg, metoprolol tartrate 50 mg in the morning and evening, torsemide 20 mg for blood pressure and his CAD.  He continues to be on DAPT with aspirin/clopidogrel.  He is on Esbriet 801 mg tablets but instead of taking this 3 times a day has only been taking it 2 times per day.    I last saw him on May 26, 2022 at which time he continued to be stable.  He was experiencing some shortness of breath with walking.  He was on supplemental oxygen typically 3 L/day.  He denies any significant episodes of chest pain but at times has had a rare short-lived episode with prompt relief.  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  He does experience some lower extremity edema.  Apparently he has not been taking torsemide 20 mg for the last 3 to 4 weeks.  He continues to be followed by Dr. Isaiah Serge for his interstitial fibrosis and also sees Dr. Wolfgang Phoenix for his stage IIIa CKD.     Since I last saw him, he underwent a 2D echo Doppler study on June 30, 2022.  This revealed EF at 60 to 65%.  There was mildly reduced RV systolic function with mild dilatation.  Estimated RV systolic pressure was increased  at 58.4 mm.  Mitral and aortic valves were grossly normal.  He has been seen by Dr. Isaiah Serge and had undergone CT chest high-resolution on August 31, 2022.  This showed fibrotic interstitial lung disease with honeycomb change.  There was possible mild progression when compared to a prior study in November 2020.  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  He was also noted to have enlarged mediastinal and hilar lymph nodes unchanged felt to be reactive.  His pulmonary artery was enlarged.  He had severe coronary artery calcification and aortic atherosclerosis.  When evaluated by Dr. De Hollingshead on September 08, 2022 was on Esbriet and with his moderate pulmonary hypertension it was recommended that he have a right heart catheterization.  Presently, Maurice Munoz has continued to use supplemental oxygen at 4 to 5 L.  He tells me he experienced an episode of chest burning on March 20 associated with increasing shortness of breath.  He has been on an antianginal regimen of amlodipine 5 mg, isosorbide 60 mg in the morning, lisinopril 2.5 mg and metoprolol tartrate 50 mg twice a day.  He has continued to be on DAPT with aspirin/Plavix and is on atorvastatin 80 mg for hyperlipidemia.  He is on Esbriet 801 mg tablet 3 times a day in addition to as needed prednisone for gout.  He takes torsemide 20 mg daily.  He presents for evaluation.   Past Medical History:  Diagnosis Date   Adrenal adenoma, left    small   Aortic atherosclerosis    Back pain    Bilateral carotid artery stenosis followed by dr Tresa Endo   per last carotid duplex 07-17-2014  -- proximal LICA 50-69% ,  RICA 0-49%   CAD (coronary artery disease)    a. s/p prior intervention to RCA and PDA in 1999 b. DES to LAD and distal RCA in 2007    Cardiomegaly    Stable   Chronic constipation    Chronic pain syndrome    CKD (chronic kidney disease), stage III    DDD (degenerative disc disease), cervical    DDD (degenerative disc disease), lumbosacral    Dyspnea    Dyspnea on exertion    Gout    per pt last inflared episode 2015 approx.   Grade II diastolic dysfunction    H/O epistaxis    per pt sees ENT dr Suszanne Conners for cauterization (pt takes plavix)   History of acute pancreatitis 08/08/2014   History of kidney stones    HTN (hypertension)    Hyperlipidemia    Interstitial lung disease    Left arm pain    s/p fall   Nocturia    Numbness of right foot    due to DDD lumbar   Peripheral edema    Pilonidal cyst    Pleural effusion on right    Pulmonary fibrosis    followed by pcp-- last chest CT 04-08-2016   S/P drug eluting coronary stent placement 10/11/2005   mLAD and dRCA   Wears dentures    upper   Wears glasses     Past Surgical History:  Procedure Laterality Date   ANTERIOR CERVICAL DECOMP/DISCECTOMY FUSION  12-25-2001     dr Terrilee Files [facility]   C3 -- C7   ANTERIOR CERVICAL DECOMP/DISCECTOMY FUSION  12-11-2012   dr Channing Mutters  [facility])   CARDIOVASCULAR STRESS TEST  08-23-2012    dr Tresa Endo   normal lexiscan nuclear study w/ no ischemia/  normal LV function and wall motion, ef 65%  CARDIOVASCULAR STRESS TEST     CORONARY ANGIOPLASTY  1999   PTCA to RCA & PDA   CORONARY ANGIOPLASTY WITH STENT PLACEMENT  10-11-2005  dr [name]   PCI and stenting to midLAD & dRCA (Cypher DES x2)   CORONARY BALLOON ANGIOPLASTY N/A 04/14/2018   Procedure: CORONARY BALLOON ANGIOPLASTY;  Surgeon: Lennette Bihari, MD;  Location: [facility] INVASIVE CV LAB;  Service: Cardiovascular;  Laterality: N/A;   CORONARY STENT INTERVENTION N/A 04/14/2018   Procedure: CORONARY STENT INTERVENTION;  Surgeon: Lennette Bihari, MD;  Location: [facility] INVASIVE CV LAB;  Service: Cardiovascular;  Laterality: N/A;   CYSTOSCOPY W/ URETERAL STENT PLACEMENT Left  02-11-2010     [facility]   LEFT HEART CATH N/A 04/14/2018   Procedure: Left Heart Cath;  Surgeon: Lennette Bihari, MD;  Location: [facility] INVASIVE CV LAB;  Service: Cardiovascular;  Laterality: N/A;   LEFT HEART CATH AND CORONARY ANGIOGRAPHY N/A 04/12/2018   Procedure: LEFT HEART CATH AND CORONARY ANGIOGRAPHY;  Surgeon: Corky Crafts, MD;  Location: [facility] INVASIVE CV LAB;  Service: Cardiovascular;  Laterality: N/A;   LUMBAR LAMINECTOMY  2015   L5 -- S1   PILONIDAL CYST EXCISION  1980s   PILONIDAL CYST EXCISION N/A 03/31/2017   Procedure: EXCISION OF PILONIDAL DISEASE with Flap Rotation;  Surgeon: Romie Levee, MD;  Location: [facility];  Service: General;  Laterality: N/A;   THROAT SURGERY  1996   per pt "removal piece of cryloid(?) that was pressing against throat"  states no issues since   TRANSTHORACIC ECHOCARDIOGRAM  08-24-2010  dr Tresa Endo   ef 50-55%/  mild MR/  trivial TR   WOUND DEBRIDEMENT N/A 10/06/2017   Procedure: DEBRIDEMENT WOUND PLACEMENT OF WOUND HEALING MATRIX;  Surgeon: Romie Levee, MD;  Location: [facility];  Service: General;  Laterality: N/A;    Allergies  Allergen Reactions   Ibuprofen Itching    Motrin brand   Neosporin [Neomycin-Bacitracin Zn-Polymyx] Swelling   Penicillins Rash     Has patient had a PCN reaction causing immediate rash, facial/tongue/throat swelling, SOB or lightheadedness with hypotension: No Has patient had a PCN reaction causing severe rash involving mucus membranes or skin necrosis: No Has patient had a PCN reaction that required hospitalization: No Has patient had a PCN reaction occurring within the last 10 years: No If all of the above answers are "NO", then may proceed with Cephalosporin use.     Current Outpatient Medications  Medication Sig Dispense Refill   acetaminophen (TYLENOL) 500 MG tablet Take 1,000 mg by mouth every 6 (six) hours as needed.     allopurinol (ZYLOPRIM) 300 MG tablet Take 300 mg by  mouth every morning.      amLODipine (NORVASC) 5 MG tablet TAKE ONE TABLET (  TOTAL) BY MOUTH DAILY. 90 tablet 2   aspirin EC 81 MG tablet Take 81 mg by mouth daily.     atorvastatin (LIPITOR) 80 MG tablet TAKE ONE TABLET (  TOTAL) BY MOUTH AT6PM 90 tablet 3   b complex vitamins tablet Take 1 tablet by mouth daily.     benzonatate (TESSALON) 200 MG capsule TAKE ONE CAPSULE (  TOTAL) BY MOUTH TWO TIMES DAILY AS NEEDED FOR COUGH 60 capsule 2   cholecalciferol (VITAMIN D) 1000 units tablet Take 5,000 Units by mouth every morning.      clopidogrel (PLAVIX) 75 MG tablet TAKE ONE (1) TABLET BY MOUTH EVERY DAY 90 tablet 2   ESBRIET 801 MG TABS Take 1 tablet (  801 mg total) by mouth 3 (three) times daily with meals. 270 tablet 1   lisinopril (ZESTRIL) 2.5 MG tablet TAKE ONE TABLET (2.5MG  TOTAL) BY MOUTH DAILY 90 tablet 1   nitroGLYCERIN (NITROSTAT) 0.4 MG SL tablet Place 1 tablet (0.4 mg total) under the tongue every 5 (five) minutes as needed for chest pain. 25 tablet 3   oxyCODONE-acetaminophen (PERCOCET) 10-325 MG per tablet Take 1 tablet by mouth every 8 (eight) hours as needed for pain.      predniSONE (STERAPRED UNI-PAK 21 TAB) 10 MG (21) TBPK tablet Take 10 mg by mouth daily. PRN FOR GOUT     prochlorperazine (COMPAZINE) 10 MG tablet TAKE ONE TABLET (10MG  TOTAL) BY MOUTH EVERY SIX HOURS AS NEEDED FOR NAUSEA OR VOMITING (Patient taking differently: Take 10 mg by mouth every 6 (six) hours as needed for nausea or vomiting.) 30 tablet 1   tiZANidine (ZANAFLEX) 4 MG tablet Take 4 mg by mouth at bedtime.      torsemide (DEMADEX) 20 MG tablet TAKE ONE TABLET (20MG  TOTAL) BY MOUTH DAILY. IF ADDITIONAL SWELLING, MAY TAKE AN ADDITIONAL DOSE IN THE AFTERNOON (Patient taking differently: Take 20 mg by mouth daily. May take an additional dose the afternoon) 180 tablet 1   isosorbide mononitrate (IMDUR) 60 MG 24 hr tablet Take 60 mg  ( 1 tablet)  in the morning and  30 mg ( 1/2 tablet )  in the evening 135  tablet 3   metoprolol tartrate (LOPRESSOR) 50 MG tablet Take 1.5 tablets (75 mg total) by mouth 2 (two) times daily. 270 tablet 3   Current Facility-Administered Medications  Medication Dose Route Frequency Provider Last Rate Last Admin   sodium chloride flush (NS) 0.9 % injection 3 mL  3 mL Intravenous Q12H Lennette Bihari, MD        Socially he is married. There are no children. Has a remote tobacco history having quit approximately 16 years ago. He's not very active. He does not routinely exercise.  ROS General: Negative; No fevers, chills, or night sweats; weight loss since initiating Esbriet HEENT: Negative; No changes in vision or hearing, sinus congestion, difficulty swallowing Pulmonary: Positive for pulmonary fibrosis Cardiovascular: See history of present illness GI: Negative; No nausea, vomiting, diarrhea, or abdominal pain GU: Negative; No dysuria, hematuria, or difficulty voiding Musculoskeletal: Negative; no myalgias, joint pain, or weakness Hematologic/Oncology: Negative; no easy bruising, bleeding Endocrine: Negative; no heat/cold intolerance; no diabetes Neuro: Negative; no changes in balance, headaches Skin: Negative; No rashes or skin lesions Psychiatric: Negative; No behavioral problems, depression Sleep: Negative; No snoring, daytime sleepiness, hypersomnolence, bruxism, restless legs, hypnogognic hallucinations, no cataplexy Other comprehensive 14 point system review is negative.   PE BP 116/72 (BP Location: Left Arm, Patient Position: Sitting, Cuff Size: Normal)   Pulse (!) 101   Ht 6\' 1"  (1.854 m)   Wt 183 lb (83 kg)   SpO2 92%   BMI 24.14 kg/m    Blood pressure by me was 110/70   Wt Readings from Last 3 Encounters:  09/28/22 183 lb (83 kg)  09/08/22 193 lb (87.5 kg)  05/26/22 202 lb (91.6 kg)   General: Alert, oriented, no distress.  Skin: normal turgor, no rashes, warm and dry HEENT: Normocephalic, atraumatic. Pupils equal round and reactive to  light; sclera anicteric; extraocular muscles intact;  Nose without nasal septal hypertrophy Mouth/Parynx benign; Mallinpatti scale 3 Neck: No JVD, no carotid bruits; normal carotid upstroke Lungs: clear to ausculatation and percussion; no wheezing  or rales Chest wall: without tenderness to palpitation Heart: PMI not displaced, RRR, s1 s2 normal, 1/6 systolic murmur, no diastolic murmur, no rubs, gallops, thrills, or heaves Abdomen: soft, nontender; no hepatosplenomehaly, BS+; abdominal aorta nontender and not dilated by palpation. Back: no CVA tenderness Pulses 2+ Musculoskeletal: full range of motion, normal strength, no joint deformities Extremities: 1+ bilateral lower extremity edema; no clubbing cyanosis, Homan's sign negative  Neurologic: grossly nonfocal; Cranial nerves grossly wnl Psychologic: Normal mood and affect  September 28, 2022 ECG (independently read by me): Sinus tachycardia at 101, Right axis, NSST changes  May 26, 2022 ECG (independently read by me):  NSR at 63, NSST changes  September 11, 2021 ECG (independently read by me):  NSR at 67, no ectopy, normal intervas  May 19, 2021 ECG (independently read by me):  NSR at 71, no ectopy  August 19, 2020 ECG (independently read by me): Sinus rhythm at 67, sinus arrhythmia with low atrial atrial ectopy    September 2021 ECG (independently read by me): NSR 62; no STT changes; normal intervals  May 2021 ECG (independently read by me): NSR at 66; no ST changes; normal intervals  November 2020 ECG (independently read by me): Normal sinus rhythm at 75 bpm.  No ectopy.  Normal intervals  March 2019 ECG (independently read by me): Bradycardia 57 bpm.  No ectopy.  Normal intervals.  February 2018 ECG (independently read by me): Sinus bradycardia 57 bpm.  No ectopy.  Normal intervals.  January 2018 ECG (independently read by me): Sinus rhythm at 60 bpm.  No ectopy.  Normal intervals.  June 2017 ECG (independently read by me):  Normal sinus rhythm at 61 bpm.  No ectopy.  Normal intervals.  April 2016 ECG (independently read by me): Normal sinus rhythm at 67 bpm.  No ectopy.  Normal intervals.  October 2014 ECG: Normal sinus rhythm at 63 beats per minute. No significant ST abnormalities. Normal intervals.  LABS:     Latest Ref Rng & Units 12/10/2021    8:58 AM 11/09/2021    9:40 AM 05/21/2021    2:11 PM  BMP  Glucose 70 - 99 mg/dL 85  169  678   BUN 6 - 23 mg/dL 23  20  22    Creatinine 0.40 - 1.50 mg/dL 9.38  1.01  7.51   Sodium 135 - 145 mEq/L 141  141  138   Potassium 3.5 - 5.1 mEq/L 4.2  4.2  3.8   Chloride 96 - 112 mEq/L 104  103  102   CO2 19 - 32 mEq/L 31  33  29   Calcium 8.4 - 10.5 mg/dL 02.5  9.6  9.3        Latest Ref Rng & Units 05/25/2022   11:21 AM 12/10/2021    8:58 AM 11/09/2021    9:40 AM  Hepatic Function  Total Protein 6.0 - 8.3 g/dL 6.8  7.3  6.9   Albumin 3.5 - 5.2 g/dL 3.9  4.1  4.0   AST 0 - 37 U/L 18  29  22    ALT 0 - 53 U/L 11  20  16    Alk Phosphatase 39 - 117 U/L 113  98  92   Total Bilirubin 0.2 - 1.2 mg/dL 0.9  1.3  1.4   Bilirubin, Direct 0.0 - 0.3 mg/dL 0.2         Latest Ref Rng & Units 05/21/2021    2:11 PM 02/26/2021    5:22 PM 01/14/2021  2:19 AM  CBC  WBC 4.0 - 10.5 K/uL 10.2  9.5  13.9   Hemoglobin 13.0 - 17.0 g/dL 96.0  45.4  09.8   Hematocrit 39.0 - 52.0 % 45.8  49.4  48.8   Platelets 150.0 - 400.0 K/uL 294.0  219  231    Lab Results  Component Value Date   MCV 88.5 05/21/2021   MCV 91.8 02/26/2021   MCV 91.6 01/14/2021    Lab Results  Component Value Date   TSH 1.040 08/25/2020   Lipid Panel     Component Value Date/Time   CHOL 119 08/25/2020 0754   TRIG 142 08/25/2020 0754   HDL 44 08/25/2020 0754   CHOLHDL 2.7 08/25/2020 0754   CHOLHDL 4.4 04/13/2018 0513   VLDL 27 04/13/2018 0513   LDLCALC 50 08/25/2020 0754     RADIOLOGY: No results found.    Other studies Reviewed:    LHC 04/12/2018: Conclusion      The left ventricular  ejection fraction is 50-55% by visual estimate. The left ventricular systolic function is normal. LV end diastolic pressure is mildly elevated. LVEDP 16 mm Hg. There is no aortic valve stenosis. Previously placed Dist RCA-1 stent (unknown type) is widely patent. Dist RCA-2 lesion is 95% stenosed. RPDA lesion is 80% stenosed. Prox Cx lesion is 80% stenosed. Ost 1st Mrg to 1st Mrg lesion is 80% stenosed. Previously placed Mid LAD-1 stent (unknown type) is widely patent. Mid LAD-2 lesion is 75% stenosed. Ost 2nd Diag to 2nd Diag lesion is 75% stenosed. Dist LAD lesion is 75% stenosed. Ost 1st Diag lesion is 50% stenosed. Prox LAD lesion is 25% stenosed.   Multivessel CAD.  Plan for cardiac surgery consult.  He does have diffuse distal LAD disease which is not optimal for LIMA to LAD.  If he is not a candidate for CABG, would plan to bring back for PCI.          LHC 04/14/2018: Conclusion      Mid RCA lesion is 65% stenosed. Previously placed Dist RCA-1 stent (unknown type) is widely patent. Dist RCA-2 lesion is 99% stenosed. Ost RPDA to RPDA lesion is 95% stenosed. Ost 1st Mrg to 1st Mrg lesion is 80% stenosed. Prox Cx lesion is 80% stenosed. Prox LAD to Mid LAD lesion is 10% stenosed. Mid LAD to Dist LAD lesion is 80% stenosed. Dist LAD lesion is 90% stenosed. Post intervention, there is a 20% residual stenosis. Post intervention, there is a 15% residual stenosis. Post intervention, there is a 0% residual stenosis. A stent was successfully placed. Post intervention, there is a 0% residual stenosis. Post intervention, there is a 0% residual stenosis. Mid LAD lesion is 80% stenosed. A stent was successfully placed. Post intervention, there is a 0% residual stenosis. Post intervention, there is a 0% residual stenosis. A stent was successfully placed.   Successful multilesion/multivessel PCI to the RCA and LAD vessels.   Cutting Balloon/PTCA to the mid PDA vessel of the RCA  with a 95% stenosis being reduced to 15%, and Cutting Balloon/PTCA/DES stenting with a 2.5 x 12 mm Resolute Onyx DES stent inserted in the distal RCA immediately proximal to the takeoff of the PDA vessel with stent overlap to the previously placed stent and postdilatation up to 2.75 mm with the 99% stenosis being reduced to 0%.   Three lesion intervention to the LAD with PTCA of the apical 90% LAD stenosis reduced to less than 30%, PTCA/DES stenting with a 3.0 x 22  mm Resolute Onyx DES stent postdilated to 3.25 mm with the 80% stenosis being reduced to 0% in the 80% stenosis just distal to the old LAD stent treated with PTCA/DES stenting with a 3.0 x 18 mm Resolute Onyx DES stent postdilated 3.3 mm with the 80% stenosis being reduced to 0%.   LVEDP preintervention was 15 mmHg.   RECOMMENDATION: The patient will be being maintained on dual antiplatelet therapy probably indefinitely due to significant multi-vessel disease and  intervention.  Increase medical therapy for concomitant CAD.  Patient will be hydrated post procedure.  Consider discharge over the weekend on increased medical therapy.  If recurrent symptoms develop consider staged PCI to his circumflex marginal and AV groove circumflex vessels.  __________    IMPRESSION:  1. Coronary artery disease involving native coronary artery of native heart with angina pectoris   2. Essential hypertension   3. Pulmonary fibrosis   4. Hyperlipidemia LDL goal <70   5. Stage 3a chronic kidney disease      ASSESSMENT AND PLAN: Maurice Munoz is a 77 year-old Caucasian male who is almost 26 years since his initial intervention to his RCA and PDA and 18 years since stenting to his LAD and distal RCA.  He has a history of hypertension.   Due to his significant occupational exposure, remote tobacco, and abnormal PFTs on CT imaging he was  felt most likely to have interstitial lung disease .  In 2019 he developed recurrent unstable anginal symptomatology and  was found to have progressive multivessel CAD.  He was turned down for CABG revascularization.  He underwent complex multi lesion multivessel PCI by me on April 14, 2018 at several sites in his RCA and LAD.  At the time he also had concomitant circumflex and circumflex and marginal disease which was not intervened upon.  He had felt significantly improved with reference to symptoms but when evaluated in November 2022 he had recurrent chest pain and increased shortness of breath.  He is now followed by Dr. Isaiah Serge of pulmonary for pulmonary fibrosis and is on Esbriet.  He has lost approximately 60 pounds of weight since being on Esbriet with appetite suppression and periods of nausea and fatigability  A follow-up echo Doppler study in December 2022 showed  EF was 60 to 65% with  improvement in his pulmonary hypertension with normal pulmonary artery systolic pressure.  His most recent echo Doppler study from June 30, 2022 shows normal LV function with EF 60 to 65% with mild LVH and grade 1 diastolic dysfunction.  Pulmonary pressure has significantly increased with estimated systolic pressure at 58.4 mm.  When seen by Dr. Isaiah Serge, it was recommended he undergo right heart catheterization.  The patient also has experienced an episode of chest burning on September 08, 2022.  He has noticed some increasing shortness of breath.  He is on supplemental oxygen at 4 to 5 L.  Has continued to be on atorvastatin 80 mg for hyperlipidemia with most recent lipid studies showing LDL cholesterol at 56.  With his concomitant CAD and recent chest pain rather than just perform right heart catheterization I have recommended definitive right and left heart cardiac catheterization.  He continues to be on amlodipine 5 mg, isosorbide 60 mg, lisinopril 2.5 mg, metoprolol tartrate 50 mg twice a day.  Resting pulse today is elevated at 101.  I have recommended further titration of metoprolol to tartrate to 75 mg twice a day.  I also have  recommended that he take isosorbide  60 mg in the morning and at 30 mg at night.  I discussed the cardiac catheterization procedure in detail.  We had shared decision making.  The risks and benefits of a cardiac catheterization including, but not limited to, death, stroke, MI, kidney damage and bleeding were discussed with the patient who indicates understanding and agrees to proceed.  Will tentatively set this catheterization date for October 18, 2022 and he will have follow-up laboratory prior to the catheterization study.   Lennette Biharihomas A. Gizelle Whetsel, MD, [facility] Center[facility]  10/04/2022 6:23 PM

## 2022-09-28 NOTE — Patient Instructions (Addendum)
Medication Instructions:   Increase metoprolol tartrate 75 mg  ( 1.5 tablets)  in the morning and 75 mg  in the evening   Increase Isosorbide  mononitrate 60 mg in the  morning and 30 mg  in the evening.   *If you need a refill on your cardiac medications before your next appointment, please call your pharmacy*   Lab Work: please do on October 11, 2022 CBC  BMP  If you have labs (blood work) drawn today and your tests are completely normal, you will receive your results only by: MyChart Message (if you have MyChart) OR A paper copy in the mail If you have any lab test that is abnormal or we need to change your treatment, we will call you to review the results.   Testing/Procedures:   Will be schedule at [facility] - October 18, 2022  at Cardiac cath Lab Your physician has requested that you have a  right and left cardiac catheterization. Cardiac catheterization is used to diagnose and/or treat various heart conditions. Doctors may recommend this procedure for a number of different reasons. The most common reason is to evaluate chest pain. Chest pain can be a symptom of coronary artery disease (CAD), and cardiac catheterization can show whether plaque is narrowing or blocking your heart's arteries. This procedure is also used to evaluate the valves, as well as measure the blood flow and oxygen levels in different parts of your heart. For further information please visit [email or web address] Please follow instruction sheet, as given.   Follow-Up: At [facility], you and your health needs are our priority.  As part of our continuing mission to provide you with exceptional heart care, we have created designated Provider Care Teams.  These Care Teams include your primary Cardiologist (physician) and Advanced Practice Providers (APPs -  Physician Assistants and Nurse Practitioners) who all work together to provide you with the care you need, when you need it.     Your next appointment:    Will follow up with Dr Tresa Endo after cath  The format for your next appointment:   In Person  Provider:   [name], MD    Other Instructions    [facility] A DEPT OF [place]. [facility] AT [facility] [id number] [place] [address] Dept: [phone] Loc: [phone]  Maurice Munoz  09/28/2022  You are scheduled for a Cardiac Catheterization on Monday, April 29 with Dr. [name].  1. Please arrive at the [facility] (Main Entrance A) at [facility]: [address] [place], [place] [address] at 5:30 AM (This time is two hours before your procedure to ensure your preparation). Free valet parking service is available.   Special note: Every effort is made to have your procedure done on time. Please understand that emergencies sometimes delay scheduled procedures.  2. Diet: Do not eat solid foods after midnight.  The patient may have clear liquids until 5am upon the day of the procedure.  3. Labs: You will need to have blood drawn on  CBC , BMP , April 22 at [facility] [address], [place]  Open: 8am - 5pm (Lunch 12:30 - 1:30)   Phone: [phone]. You do not need to be fasting.  4. Medication instructions in preparation for your procedure:   Contrast Allergy: No    Do Not  take Lisinopril (Zestril or Prinivil) , and Demadex  Monday, April 29,   On  the morning of your procedure, take your Aspirin 81 mg and Plavix/Clopidogrel and any morning medicines NOT listed above.  You may use sips of water.  5. Plan to go home the same day, you will only stay overnight if medically necessary. 6. Bring a current list of your medications and current insurance cards. 7. You MUST have a responsible person to drive you home. 8. Someone MUST be with you the first 24 hours after you arrive home or your discharge will be delayed. 9. Please wear clothes that are easy to get on and  off and wear slip-on shoes.  Thank you for allowing [place] to care for you!   -- [facility]

## 2022-09-28 NOTE — H&P (View-Only) (Signed)
Patient ID: Maurice Munoz, male   DOB: 03/17/1946, 76 y.o.   MRN: [id number]        HPI: Maurice Munoz, is a 76 y.o. male who presents to the office today for a 5 month cardiology followup evaluation.     [name] has established CAD dating back to 1999 when he underwent intervention to his RCA and PDA. In April 2007 he underwent stenting to his LAD and distal RCA at which time Cypher DES stents were inserted. He has a history of hypertension, obesity, hyperlipidemia, gout, and  lower extremity edema.   A two-year followup nuclear perfusion study in March 2014 was essentially unchanged from his prior study and continued to show fairly normal perfusion and was low risk, without evidence for scar. There was no significant ischemia.  In 2015 he had an injury to his right leg.  He had noted some intermittent leg swelling.  He had carotids Doppler study in January 2016 which showed 50-60% left proximal ICA diameter reduction, which had slightly increased from his prior study one year previously.  Because of a history of coughing up blood with pleuritic chest pain he underwent a CT Ango of his chest on March 21 which was negative for PE but showed diffuse peripheral lung densities throughout both upper and lower zones without honeycombing raising the Kurt concern for mild fibrotic changes but the pattern was nonspecific.    Over the last several years he has lost over 40 pounds from his peak weight.  He  was seen in the emergency room with lightheadedness and was hypotensive.  He was told to discontinue his isosorbide as well as lisinopril.   In the setting of his hypotension he was found to have an increased creatinine at 1.4 recently had been 1.1. He has been followed by Dr.Befakadu in [place] for his renal issues.  Over the past year, he has experienced occasional episodes of chest pain which he describes as tightness.  Typically, this can occur with exertion.  At times he also notices  a chest burning.  He admits to shortness of breath.  He has been evaluated by Dr. McQuaid for pulmonary fibrosis.  He has an extensive occupational history infrequently had worked in welding, steel, and chrome plating for many years and was often exposed to significant dust inhalation.  He also had worked in knife making.  There is remote tobacco history of 2-3 packs per day for 20-25 years, but unfortunately he quit in 1980s.  He is felt most likely to have interstitial lung disease and continues further evaluation.  He has had difficulty with his low back.  He has been on lisinopril 5 mg, metoprolol 50 mg twice a day.  He continues to take simvastatin 20 mg for hyperlipidemia.  He is been maintained on aspirin and Plavix.  There is remote history of gout, which is controlled with allopurinol.  When I saw him on February 2019, he had noticed episodes of chest burning as well as chest tightness and continues to experience exertional dyspnea.  I initiated amlodipine at 5 mg.  I strongly recommended support stockings for his potential edema.  He was given a prescription for sublabel nitroglycerin.  He underwent a follow-up Lexiscan Myoview study of 08/12/2017 which was low risk and without ischemia.  EF was 57%.  There was minimal apical thinning most likely contributed by his body habitus.  He underwent an echo Doppler study on 10/14/2017 which showed an EF of 60-65% and   grade 2 diastolic dysfunction.  There was moderate pulmonary hypertension with PA pressure at 46 mm.    At evaluation in March 2019 he was feeling better.  He denied recurrent chest pain or shortness of breath.  He is lost 21 pounds in 3 weeks purposefully.   He developed recurrent chest pain symptomatology and developed unstable angina symptoms leading to hospitalization with a non-ST segment elevation MI.  On April 12, 2018 he underwent diagnostic catheterization by Dr. Varanasi due to multivessel CAD he underwent surgical consultation for  consideration of CABG surgery but was turned down.  As result, on April 14, 2018, I performed successful multi lesion/multivessel PCI to the RCA and LAD vessels.  He underwent Cutting Balloon PTCA to the mid PDA vessel of the RCA with a 95% stenosis being reduced to 15% and Cutting Balloon PTCA DES stenting with a 2.5 x12 mm Resolute stent inserted into the distal RCA immediately proximal to the takeoff of the PDA vessel with stent overlap to the previously placed stent with a 99% stenosis reduced to 0%.  He also underwent 3 lesion intervention to the LAD with PTCA of the 90% apical LAD stenosis, PTCA DES stenting at 2 additional study sites in the LAD successfully.  He still had residual concomitant 80% AV groove circumflex and circumflex marginal disease which was treated medically.  [name] has noted significant improvement following his multilesion multivessel intervention to his RCA and LAD.  He has been seen in the office since by Kathryn Lawrence, NP as well as Hao Meng, PA.  When last seen by Hao, he reduced his amlodipine since his blood pressure was low and he also had 2+ pitting edema for which he recommended restarting his previous torsemide daily.  I saw him in November 2020.  He underwent an echo Doppler study 24 2020 which showed normal systolic with EF at 60 to 65%, mild LVH diastolic parameters were indeterminate.  He had moderately elevated pulmonary artery systolic pressure at 42 mm.    He was  evaluated by me on Nov 16, 2019.  He had done well since his last evaluation but over the past 2 weeks has again begun to notice some recurrent episodes of chest pain and shortness of breath.  He also has been diagnosed with pulmonary fibrosis.  He continues to experience mild leg swelling.  During that evaluation I recommended initiating isosorbide mononitrate 30 mg.  He had been on low-dose amlodipine at 2.5 mg and I recommended further titration of 5 mg.  He continues to be on metoprolol  tartrate 25 mg twice a day and remained on DAPT with aspirin/Plavix.   He was to follow-up with Dr. Praveen Mannam for his pulmonary fibrosis.  He has been started on Esbriet for his pulmonary fibrosis.  He has noticed leg swelling bilaterally left greater than right.  Approximately 2 weeks ago he took sublingual nitroglycerin with improvement of his mild chest pain.  During that evaluation, he was on amlodipine 5 mg and I recommended further titration of isosorbide to 60 mg daily and that he continue metoprolol tartrate 50 mg twice a day.  He had 2+ bilateral leg swelling and I recommended torsemide 20 mg daily and support stockings.  I saw him in September 2021 and over the prior several months he had remained fairly stable and denied recurrent chest pain on his increased medical regimen.  He continues to be on supplemental oxygen for his pulmonary fibrosis.  He was continuing to experience some   leg swelling right greater than left but had not been using support stockings.  During that evaluation I again recommended he use support stockings.  When I saw him on August 19, 2020 he was continuing to experience leg swelling and was taking torsemide 2 times per week.  He has noticed an increased heart rate with activity.  He has been on amlodipine 5 mg, isosorbide 60 mg, metoprolol 50 mg twice a day in addition to torsemide.  He continues to be on DAPT with aspirin/Plavix.  He is tolerating atorvastatin 80 mg.  Laboratory was checked at [facility] in July 2021 and lipids were excellent.  Recently he was sleeping in a recliner.  He has not had recent laboratory.  During that evaluation, he denied recurrent anginal symptoms but he noticed very quick increase in his heart rate.  I suggested slight titration of metoprolol to tartrate from 50 mg twice a day to 75 mg in the morning and 50 mg at night.  He had 2+ lower extremity edema and I suggested he take torsemide daily for the next several days and then perhaps  every other day.  He continued to be followed by Dr. Mannam for his interstitial fibrosis and was on Esbriet.  I saw him on May 19, 2021.  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 Unfortunately, his wife died in June from metastatic cancer.  He now lives by himself.  He had recently been started on lisinopril at 2.5 mg by Dr. Bhutani and at his last evaluation this was increased to 5 mg.  He has noticed fatigability and some lightheadedness since this medication was increased.  He denies chest pain or palpitations.  During that evaluation, blood pressure was low knowing his titration of lisinopril by Dr. Bhutani to 5 mg and at that time recommended reducing back down to 2.5 mg with follow-up with Dr. Bhutani.  I saw him on September 11, 2021.  Since his prior evaluation he had lost approximately 60 pounds since he was first started on Esbriet for his pulmonary fibrosis mainly due to appetite suppression and nausea.  Presently, he is on amlodipine 5 mg, isosorbide 60 mg, lisinopril 2.5 mg, metoprolol tartrate 50 mg in the morning and evening, torsemide 20 mg for blood pressure and his CAD.  He continues to be on DAPT with aspirin/clopidogrel.  He is on Esbriet 801 mg tablets but instead of taking this 3 times a day has only been taking it 2 times per day.    I last saw him on May 26, 2022 at which time he continued to be stable.  He was experiencing some shortness of breath with walking.  He was on supplemental oxygen typically 3 L/day.  He denies any significant episodes of chest pain but at times has had a rare short-lived episode with prompt relief.  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  He does experience some lower extremity edema.  Apparently he has not been taking torsemide 20 mg for the last 3 to 4 weeks.  He continues to be followed by Dr. Mannam for his interstitial fibrosis and also sees Dr. Bhutani for his stage IIIa CKD.     Since I last saw him, he underwent a 2D echo Doppler study on June 30, 2022.  This revealed EF at 60 to 65%.  There was mildly reduced RV systolic function with mild dilatation.  Estimated RV systolic pressure was increased   at 58.4 mm.  Mitral and aortic valves were grossly normal.  He has been seen by Dr. Mannam and had undergone CT chest high-resolution on August 31, 2022.  This showed fibrotic interstitial lung disease with honeycomb change.  There was possible mild progression when compared to a prior study in November 2020.  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  He was also noted to have enlarged mediastinal and hilar lymph nodes unchanged felt to be reactive.  His pulmonary artery was enlarged.  He had severe coronary artery calcification and aortic atherosclerosis.  When evaluated by Dr. Manam on September 08, 2022 was on Esbriet and with his moderate pulmonary hypertension it was recommended that he have a right heart catheterization.  Presently, [name] has continued to use supplemental oxygen at 4 to 5 L.  He tells me he experienced an episode of chest burning on March 20 associated with increasing shortness of breath.  He has been on an antianginal regimen of amlodipine 5 mg, isosorbide 60 mg in the morning, lisinopril 2.5 mg and metoprolol tartrate 50 mg twice a day.  He has continued to be on DAPT with aspirin/Plavix and is on atorvastatin 80 mg for hyperlipidemia.  He is on Esbriet 801 mg tablet 3 times a day in addition to as needed prednisone for gout.  He takes torsemide 20 mg daily.  He presents for evaluation.   Past Medical History:  Diagnosis Date   Adrenal adenoma, left    small   Aortic atherosclerosis    Back pain    Bilateral carotid artery stenosis followed by dr Justan Gaede   per last carotid duplex 07-17-2014  -- proximal LICA 50-69% ,  RICA 0-49%   CAD (coronary artery disease)    a. s/p prior intervention to RCA and PDA in 1999 b. DES to LAD and distal RCA in 2007    Cardiomegaly    Stable   Chronic constipation    Chronic pain syndrome    CKD (chronic kidney disease), stage III    DDD (degenerative disc disease), cervical    DDD (degenerative disc disease), lumbosacral    Dyspnea    Dyspnea on exertion    Gout    per pt last inflared episode 2015 approx.   Grade II diastolic dysfunction    H/O epistaxis    per pt sees ENT dr teoh for cauterization (pt takes plavix)   History of acute pancreatitis 08/08/2014   History of kidney stones    HTN (hypertension)    Hyperlipidemia    Interstitial lung disease    Left arm pain    s/p fall   Nocturia    Numbness of right foot    due to DDD lumbar   Peripheral edema    Pilonidal cyst    Pleural effusion on right    Pulmonary fibrosis    followed by pcp-- last chest CT 04-08-2016   S/P drug eluting coronary stent placement 10/11/2005   mLAD and dRCA   Wears dentures    upper   Wears glasses     Past Surgical History:  Procedure Laterality Date   ANTERIOR CERVICAL DECOMP/DISCECTOMY FUSION  12-25-2001     dr j. jenkins [facility]   C3 -- C7   ANTERIOR CERVICAL DECOMP/DISCECTOMY FUSION  12-11-2012   dr roy  ([facility])   CARDIOVASCULAR STRESS TEST  08-23-2012    dr Adelae Yodice   normal lexiscan nuclear study w/ no ischemia/  normal LV function and wall motion, ef 65%     CARDIOVASCULAR STRESS TEST     CORONARY ANGIOPLASTY  1999   PTCA to RCA & PDA   CORONARY ANGIOPLASTY WITH STENT PLACEMENT  10-11-2005  dr Ryn Peine   PCI and stenting to midLAD & dRCA (Cypher DES x2)   CORONARY BALLOON ANGIOPLASTY N/A 04/14/2018   Procedure: CORONARY BALLOON ANGIOPLASTY;  Surgeon: Rossie Scarfone A, MD;  Location: [facility];  Service: Cardiovascular;  Laterality: N/A;   CORONARY STENT INTERVENTION N/A 04/14/2018   Procedure: CORONARY STENT INTERVENTION;  Surgeon: Adis Sturgill A, MD;  Location: [facility];  Service: Cardiovascular;  Laterality: N/A;   CYSTOSCOPY W/ URETERAL STENT PLACEMENT Left  02-11-2010     [facility]   LEFT HEART CATH N/A 04/14/2018   Procedure: Left Heart Cath;  Surgeon: Dunia Pringle A, MD;  Location: [facility];  Service: Cardiovascular;  Laterality: N/A;   LEFT HEART CATH AND CORONARY ANGIOGRAPHY N/A 04/12/2018   Procedure: LEFT HEART CATH AND CORONARY ANGIOGRAPHY;  Surgeon: Varanasi, Jayadeep S, MD;  Location: [facility];  Service: Cardiovascular;  Laterality: N/A;   LUMBAR LAMINECTOMY  2015   L5 -- S1   PILONIDAL CYST EXCISION  1980s   PILONIDAL CYST EXCISION N/A 03/31/2017   Procedure: EXCISION OF PILONIDAL DISEASE with Flap Rotation;  Surgeon: Yadir Zentner, Alicia, MD;  Location: [facility];  Service: General;  Laterality: N/A;   THROAT SURGERY  1996   per pt "removal piece of cryloid(?) that was pressing against throat"  states no issues since   TRANSTHORACIC ECHOCARDIOGRAM  08-24-2010  dr Aleisha Paone   ef 50-55%/  mild MR/  trivial TR   WOUND DEBRIDEMENT N/A 10/06/2017   Procedure: DEBRIDEMENT WOUND PLACEMENT OF WOUND HEALING MATRIX;  Surgeon: Kohle Winner, Alicia, MD;  Location: [facility];  Service: General;  Laterality: N/A;    Allergies  Allergen Reactions   Ibuprofen Itching    Motrin brand   Neosporin [Neomycin-Bacitracin Zn-Polymyx] Swelling   Penicillins Rash     Has patient had a PCN reaction causing immediate rash, facial/tongue/throat swelling, SOB or lightheadedness with hypotension: No Has patient had a PCN reaction causing severe rash involving mucus membranes or skin necrosis: No Has patient had a PCN reaction that required hospitalization: No Has patient had a PCN reaction occurring within the last 10 years: No If all of the above answers are "NO", then may proceed with Cephalosporin use.     Current Outpatient Medications  Medication Sig Dispense Refill   acetaminophen (TYLENOL) 500 MG tablet Take 1,000 mg by mouth every 6 (six) hours as needed.     allopurinol (ZYLOPRIM) 300 MG tablet Take 300 mg by  mouth every morning.      amLODipine (NORVASC) 5 MG tablet TAKE ONE TABLET (5MG TOTAL) BY MOUTH DAILY. 90 tablet 2   aspirin EC 81 MG tablet Take 81 mg by mouth daily.     atorvastatin (LIPITOR) 80 MG tablet TAKE ONE TABLET (80MG TOTAL) BY MOUTH AT6PM 90 tablet 3   b complex vitamins tablet Take 1 tablet by mouth daily.     benzonatate (TESSALON) 200 MG capsule TAKE ONE CAPSULE (200MG TOTAL) BY MOUTH TWO TIMES DAILY AS NEEDED FOR COUGH 60 capsule 2   cholecalciferol (VITAMIN D) 1000 units tablet Take 5,000 Units by mouth every morning.      clopidogrel (PLAVIX) 75 MG tablet TAKE ONE (1) TABLET BY MOUTH EVERY DAY 90 tablet 2   ESBRIET 801 MG TABS Take 1 tablet (  801 mg total) by mouth 3 (three) times daily with meals. 270 tablet 1   lisinopril (ZESTRIL) 2.5 MG tablet TAKE ONE TABLET (2.5MG TOTAL) BY MOUTH DAILY 90 tablet 1   nitroGLYCERIN (NITROSTAT) 0.4 MG SL tablet Place 1 tablet (0.4 mg total) under the tongue every 5 (five) minutes as needed for chest pain. 25 tablet 3   oxyCODONE-acetaminophen (PERCOCET) 10-325 MG per tablet Take 1 tablet by mouth every 8 (eight) hours as needed for pain.      predniSONE (STERAPRED UNI-PAK 21 TAB) 10 MG (21) TBPK tablet Take 10 mg by mouth daily. PRN FOR GOUT     prochlorperazine (COMPAZINE) 10 MG tablet TAKE ONE TABLET (10MG TOTAL) BY MOUTH EVERY SIX HOURS AS NEEDED FOR NAUSEA OR VOMITING (Patient taking differently: Take 10 mg by mouth every 6 (six) hours as needed for nausea or vomiting.) 30 tablet 1   tiZANidine (ZANAFLEX) 4 MG tablet Take 4 mg by mouth at bedtime.      torsemide (DEMADEX) 20 MG tablet TAKE ONE TABLET (20MG TOTAL) BY MOUTH DAILY. IF ADDITIONAL SWELLING, MAY TAKE AN ADDITIONAL DOSE IN THE AFTERNOON (Patient taking differently: Take 20 mg by mouth daily. May take an additional dose the afternoon) 180 tablet 1   isosorbide mononitrate (IMDUR) 60 MG 24 hr tablet Take 60 mg  ( 1 tablet)  in the morning and  30 mg ( 1/2 tablet )  in the evening 135  tablet 3   metoprolol tartrate (LOPRESSOR) 50 MG tablet Take 1.5 tablets (75 mg total) by mouth 2 (two) times daily. 270 tablet 3   Current Facility-Administered Medications  Medication Dose Route Frequency Provider Last Rate Last Admin   sodium chloride flush (NS) 0.9 % injection 3 mL  3 mL Intravenous Q12H Demi Trieu A, MD        Socially he is married. There are no children. Has a remote tobacco history having quit approximately 16 years ago. He's not very active. He does not routinely exercise.  ROS General: Negative; No fevers, chills, or night sweats; weight loss since initiating Esbriet HEENT: Negative; No changes in vision or hearing, sinus congestion, difficulty swallowing Pulmonary: Positive for pulmonary fibrosis Cardiovascular: See history of present illness GI: Negative; No nausea, vomiting, diarrhea, or abdominal pain GU: Negative; No dysuria, hematuria, or difficulty voiding Musculoskeletal: Negative; no myalgias, joint pain, or weakness Hematologic/Oncology: Negative; no easy bruising, bleeding Endocrine: Negative; no heat/cold intolerance; no diabetes Neuro: Negative; no changes in balance, headaches Skin: Negative; No rashes or skin lesions Psychiatric: Negative; No behavioral problems, depression Sleep: Negative; No snoring, daytime sleepiness, hypersomnolence, bruxism, restless legs, hypnogognic hallucinations, no cataplexy Other comprehensive 14 point system review is negative.   PE BP 116/72 (BP Location: Left Arm, Patient Position: Sitting, Cuff Size: Normal)   Pulse (!) 101   Ht 6' 1" (1.854 m)   Wt 183 lb (83 kg)   SpO2 92%   BMI 24.14 kg/m    Blood pressure by me was 110/70   Wt Readings from Last 3 Encounters:  09/28/22 183 lb (83 kg)  09/08/22 193 lb (87.5 kg)  05/26/22 202 lb (91.6 kg)   General: Alert, oriented, no distress.  Skin: normal turgor, no rashes, warm and dry HEENT: Normocephalic, atraumatic. Pupils equal round and reactive to  light; sclera anicteric; extraocular muscles intact;  Nose without nasal septal hypertrophy Mouth/Parynx benign; Mallinpatti scale 3 Neck: No JVD, no carotid bruits; normal carotid upstroke Lungs: clear to ausculatation and percussion; no wheezing   or rales Chest wall: without tenderness to palpitation Heart: PMI not displaced, RRR, s1 s2 normal, 1/6 systolic murmur, no diastolic murmur, no rubs, gallops, thrills, or heaves Abdomen: soft, nontender; no hepatosplenomehaly, BS+; abdominal aorta nontender and not dilated by palpation. Back: no CVA tenderness Pulses 2+ Musculoskeletal: full range of motion, normal strength, no joint deformities Extremities: 1+ bilateral lower extremity edema; no clubbing cyanosis, Homan's sign negative  Neurologic: grossly nonfocal; Cranial nerves grossly wnl Psychologic: Normal mood and affect  September 28, 2022 ECG (independently read by me): Sinus tachycardia at 101, Right axis, NSST changes  May 26, 2022 ECG (independently read by me):  NSR at 63, NSST changes  September 11, 2021 ECG (independently read by me):  NSR at 67, no ectopy, normal intervas  May 19, 2021 ECG (independently read by me):  NSR at 71, no ectopy  August 19, 2020 ECG (independently read by me): Sinus rhythm at 67, sinus arrhythmia with low atrial atrial ectopy    September 2021 ECG (independently read by me): NSR 62; no STT changes; normal intervals  May 2021 ECG (independently read by me): NSR at 66; no ST changes; normal intervals  November 2020 ECG (independently read by me): Normal sinus rhythm at 75 bpm.  No ectopy.  Normal intervals  March 2019 ECG (independently read by me): Bradycardia 57 bpm.  No ectopy.  Normal intervals.  February 2018 ECG (independently read by me): Sinus bradycardia 57 bpm.  No ectopy.  Normal intervals.  January 2018 ECG (independently read by me): Sinus rhythm at 60 bpm.  No ectopy.  Normal intervals.  June 2017 ECG (independently read by me):  Normal sinus rhythm at 61 bpm.  No ectopy.  Normal intervals.  April 2016 ECG (independently read by me): Normal sinus rhythm at 67 bpm.  No ectopy.  Normal intervals.  October 2014 ECG: Normal sinus rhythm at 63 beats per minute. No significant ST abnormalities. Normal intervals.  LABS:     Latest Ref Rng & Units 12/10/2021    8:58 AM 11/09/2021    9:40 AM 05/21/2021    2:11 PM  BMP  Glucose 70 - 99 mg/dL 85  109  109   BUN 6 - 23 mg/dL 23  20  22   Creatinine 0.40 - 1.50 mg/dL 1.21  1.21  1.16   Sodium 135 - 145 mEq/L 141  141  138   Potassium 3.5 - 5.1 mEq/L 4.2  4.2  3.8   Chloride 96 - 112 mEq/L 104  103  102   CO2 19 - 32 mEq/L 31  33  29   Calcium 8.4 - 10.5 mg/dL 10.0  9.6  9.3        Latest Ref Rng & Units 05/25/2022   11:21 AM 12/10/2021    8:58 AM 11/09/2021    9:40 AM  Hepatic Function  Total Protein 6.0 - 8.3 g/dL 6.8  7.3  6.9   Albumin 3.5 - 5.2 g/dL 3.9  4.1  4.0   AST 0 - 37 U/L 18  29  22   ALT 0 - 53 U/L 11  20  16   Alk Phosphatase 39 - 117 U/L 113  98  92   Total Bilirubin 0.2 - 1.2 mg/dL 0.9  1.3  1.4   Bilirubin, Direct 0.0 - 0.3 mg/dL 0.2         Latest Ref Rng & Units 05/21/2021    2:11 PM 02/26/2021    5:22 PM 01/14/2021      2:19 AM  CBC  WBC 4.0 - 10.5 K/uL 10.2  9.5  13.9   Hemoglobin 13.0 - 17.0 g/dL 15.5  16.1  16.0   Hematocrit 39.0 - 52.0 % 45.8  49.4  48.8   Platelets 150.0 - 400.0 K/uL 294.0  219  231    Lab Results  Component Value Date   MCV 88.5 05/21/2021   MCV 91.8 02/26/2021   MCV 91.6 01/14/2021    Lab Results  Component Value Date   TSH 1.040 08/25/2020   Lipid Panel     Component Value Date/Time   CHOL 119 08/25/2020 0754   TRIG 142 08/25/2020 0754   HDL 44 08/25/2020 0754   CHOLHDL 2.7 08/25/2020 0754   CHOLHDL 4.4 04/13/2018 0513   VLDL 27 04/13/2018 0513   LDLCALC 50 08/25/2020 0754     RADIOLOGY: No results found.    Other studies Reviewed:    [facility] 04/12/2018: Conclusion      The left ventricular  ejection fraction is 50-55% by visual estimate. The left ventricular systolic function is normal. LV end diastolic pressure is mildly elevated. LVEDP 16 mm Hg. There is no aortic valve stenosis. Previously placed Dist RCA-1 stent (unknown type) is widely patent. Dist RCA-2 lesion is 95% stenosed. RPDA lesion is 80% stenosed. Prox Cx lesion is 80% stenosed. Ost 1st Mrg to 1st Mrg lesion is 80% stenosed. Previously placed Mid LAD-1 stent (unknown type) is widely patent. Mid LAD-2 lesion is 75% stenosed. Ost 2nd Diag to 2nd Diag lesion is 75% stenosed. Dist LAD lesion is 75% stenosed. Ost 1st Diag lesion is 50% stenosed. Prox LAD lesion is 25% stenosed.   Multivessel CAD.  Plan for cardiac surgery consult.  He does have diffuse distal LAD disease which is not optimal for LIMA to LAD.  If he is not a candidate for CABG, would plan to bring back for PCI.          [facility] 04/14/2018: Conclusion      Mid RCA lesion is 65% stenosed. Previously placed Dist RCA-1 stent (unknown type) is widely patent. Dist RCA-2 lesion is 99% stenosed. Ost RPDA to RPDA lesion is 95% stenosed. Ost 1st Mrg to 1st Mrg lesion is 80% stenosed. Prox Cx lesion is 80% stenosed. Prox LAD to Mid LAD lesion is 10% stenosed. Mid LAD to Dist LAD lesion is 80% stenosed. Dist LAD lesion is 90% stenosed. Post intervention, there is a 20% residual stenosis. Post intervention, there is a 15% residual stenosis. Post intervention, there is a 0% residual stenosis. A stent was successfully placed. Post intervention, there is a 0% residual stenosis. Post intervention, there is a 0% residual stenosis. Mid LAD lesion is 80% stenosed. A stent was successfully placed. Post intervention, there is a 0% residual stenosis. Post intervention, there is a 0% residual stenosis. A stent was successfully placed.   Successful multilesion/multivessel PCI to the RCA and LAD vessels.   Cutting Balloon/PTCA to the mid PDA vessel of the RCA  with a 95% stenosis being reduced to 15%, and Cutting Balloon/PTCA/DES stenting with a 2.5 x 12 mm Resolute Onyx DES stent inserted in the distal RCA immediately proximal to the takeoff of the PDA vessel with stent overlap to the previously placed stent and postdilatation up to 2.75 mm with the 99% stenosis being reduced to 0%.   Three lesion intervention to the LAD with PTCA of the apical 90% LAD stenosis reduced to less than 30%, PTCA/DES stenting with a 3.0 x 22   mm Resolute Onyx DES stent postdilated to 3.25 mm with the 80% stenosis being reduced to 0% in the 80% stenosis just distal to the old LAD stent treated with PTCA/DES stenting with a 3.0 x 18 mm Resolute Onyx DES stent postdilated 3.3 mm with the 80% stenosis being reduced to 0%.   LVEDP preintervention was 15 mmHg.   RECOMMENDATION: The patient will be being maintained on dual antiplatelet therapy probably indefinitely due to significant multi-vessel disease and  intervention.  Increase medical therapy for concomitant CAD.  Patient will be hydrated post procedure.  Consider discharge over the weekend on increased medical therapy.  If recurrent symptoms develop consider staged PCI to his circumflex marginal and AV groove circumflex vessels.  __________    IMPRESSION:  1. Coronary artery disease involving native coronary artery of native heart with angina pectoris   2. Essential hypertension   3. Pulmonary fibrosis   4. Hyperlipidemia LDL goal <70   5. Stage 3a chronic kidney disease      ASSESSMENT AND PLAN: [name] is a 76 year-old Caucasian male who is almost 26 years since his initial intervention to his RCA and PDA and 18 years since stenting to his LAD and distal RCA.  He has a history of hypertension.   Due to his significant occupational exposure, remote tobacco, and abnormal PFTs on CT imaging he was  felt most likely to have interstitial lung disease .  In 2019 he developed recurrent unstable anginal symptomatology and  was found to have progressive multivessel CAD.  He was turned down for CABG revascularization.  He underwent complex multi lesion multivessel PCI by me on April 14, 2018 at several sites in his RCA and LAD.  At the time he also had concomitant circumflex and circumflex and marginal disease which was not intervened upon.  He had felt significantly improved with reference to symptoms but when evaluated in November 2022 he had recurrent chest pain and increased shortness of breath.  He is now followed by Dr. Mannam of pulmonary for pulmonary fibrosis and is on Esbriet.  He has lost approximately 60 pounds of weight since being on Esbriet with appetite suppression and periods of nausea and fatigability  A follow-up echo Doppler study in December 2022 showed  EF was 60 to 65% with  improvement in his pulmonary hypertension with normal pulmonary artery systolic pressure.  His most recent echo Doppler study from June 30, 2022 shows normal LV function with EF 60 to 65% with mild LVH and grade 1 diastolic dysfunction.  Pulmonary pressure has significantly increased with estimated systolic pressure at 58.4 mm.  When seen by Dr. Mannam, it was recommended he undergo right heart catheterization.  The patient also has experienced an episode of chest burning on September 08, 2022.  He has noticed some increasing shortness of breath.  He is on supplemental oxygen at 4 to 5 L.  Has continued to be on atorvastatin 80 mg for hyperlipidemia with most recent lipid studies showing LDL cholesterol at 56.  With his concomitant CAD and recent chest pain rather than just perform right heart catheterization I have recommended definitive right and left heart cardiac catheterization.  He continues to be on amlodipine 5 mg, isosorbide 60 mg, lisinopril 2.5 mg, metoprolol tartrate 50 mg twice a day.  Resting pulse today is elevated at 101.  I have recommended further titration of metoprolol to tartrate to 75 mg twice a day.  I also have  recommended that he take isosorbide   60 mg in the morning and at 30 mg at night.  I discussed the cardiac catheterization procedure in detail.  We had shared decision making.  The risks and benefits of a cardiac catheterization including, but not limited to, death, stroke, MI, kidney damage and bleeding were discussed with the patient who indicates understanding and agrees to proceed.  Will tentatively set this catheterization date for October 18, 2022 and he will have follow-up laboratory prior to the catheterization study.   Ardeth Repetto A. Gulianna Hornsby, MD, FACC  10/04/2022 6:23 PM  

## 2022-10-04 ENCOUNTER — Encounter: Payer: Self-pay | Admitting: Cardiovascular Disease

## 2022-10-12 LAB — BASIC METABOLIC PANEL
BUN/Creatinine Ratio: 12 (ref 10–24)
BUN: 16 mg/dL (ref 8–27)
CO2: 23 mmol/L (ref 20–29)
Calcium: 9.6 mg/dL (ref 8.6–10.2)
Chloride: 100 mmol/L (ref 96–106)
Creatinine, Ser: 1.34 mg/dL — ABNORMAL HIGH (ref 0.76–1.27)
Glucose: 78 mg/dL (ref 70–99)
Potassium: 4.3 mmol/L (ref 3.5–5.2)
Sodium: 139 mmol/L (ref 134–144)
eGFR: 56 mL/min/{1.73_m2} — ABNORMAL LOW (ref >59)

## 2022-10-12 LAB — CBC
Hematocrit: 46 % (ref 37.5–51.0)
Hemoglobin: 15.6 g/dL (ref 13.0–17.7)
MCH: 31 pg (ref 26.6–33.0)
MCHC: 33.9 g/dL (ref 31.5–35.7)
MCV: 92 fL (ref 79–97)
Platelets: 204 10*3/uL (ref 150–450)
RBC: 5.03 x10E6/uL (ref 4.14–5.80)
RDW: 13.4 % (ref 11.6–15.4)
WBC: 10.3 10*3/uL (ref 3.4–10.8)

## 2022-10-14 ENCOUNTER — Telehealth: Payer: Self-pay | Admitting: *Deleted

## 2022-10-14 NOTE — Telephone Encounter (Signed)
Cardiac Catheterization scheduled at [facility] for: Monday October 18, 2022 7:30 AM Arrival time [facility] Main Entrance A at: 5:30 AM  Nothing to eat after midnight prior to procedure, clear liquids until 5 AM day of procedure.  Medication instructions: -Hold:  Lisinopril/Torsemide (takes prn)-day before and day of procedure per protocol GFR 56 -Other usual morning medications can be taken with sips of water including aspirin 81 mg and Plavix 75 mg.  Confirmed patient has responsible adult to drive home post procedure and be with patient first 24 hours after arriving home.  Plan to go home the same day, you will only stay overnight if medically necessary.  Reviewed procedure instructions with patient.

## 2022-10-18 ENCOUNTER — Encounter (HOSPITAL_COMMUNITY)
Admission: RE | Disposition: A | Discharge status: Home / Self Care | Payer: Self-pay | Source: Home / Self Care | Attending: Cardiovascular Disease

## 2022-10-18 ENCOUNTER — Other Ambulatory Visit: Payer: Self-pay

## 2022-10-18 ENCOUNTER — Ambulatory Visit (HOSPITAL_COMMUNITY)
Admission: RE | Admit: 2022-10-18 | Discharge: 2022-10-18 | Disposition: A | Discharge status: Home / Self Care | Payer: Medicare Other | Attending: Cardiovascular Disease | Admitting: Cardiovascular Disease

## 2022-10-18 DIAGNOSIS — Z7982 Long term (current) use of aspirin: Secondary | ICD-10-CM | POA: Insufficient documentation

## 2022-10-18 DIAGNOSIS — J84112 Idiopathic pulmonary fibrosis: Secondary | ICD-10-CM | POA: Insufficient documentation

## 2022-10-18 DIAGNOSIS — I2584 Coronary atherosclerosis due to calcified coronary lesion: Secondary | ICD-10-CM | POA: Diagnosis not present

## 2022-10-18 DIAGNOSIS — M109 Gout, unspecified: Secondary | ICD-10-CM | POA: Diagnosis not present

## 2022-10-18 DIAGNOSIS — I129 Hypertensive chronic kidney disease with stage 1 through stage 4 chronic kidney disease, or unspecified chronic kidney disease: Secondary | ICD-10-CM | POA: Insufficient documentation

## 2022-10-18 DIAGNOSIS — I272 Pulmonary hypertension, unspecified: Secondary | ICD-10-CM | POA: Diagnosis not present

## 2022-10-18 DIAGNOSIS — I25118 Atherosclerotic heart disease of native coronary artery with other forms of angina pectoris: Secondary | ICD-10-CM | POA: Insufficient documentation

## 2022-10-18 DIAGNOSIS — Z7902 Long term (current) use of antithrombotics/antiplatelets: Secondary | ICD-10-CM | POA: Insufficient documentation

## 2022-10-18 DIAGNOSIS — E785 Hyperlipidemia, unspecified: Secondary | ICD-10-CM | POA: Diagnosis not present

## 2022-10-18 DIAGNOSIS — N1831 Chronic kidney disease, stage 3a: Secondary | ICD-10-CM | POA: Insufficient documentation

## 2022-10-18 DIAGNOSIS — Z79899 Other long term (current) drug therapy: Secondary | ICD-10-CM | POA: Insufficient documentation

## 2022-10-18 DIAGNOSIS — Z955 Presence of coronary angioplasty implant and graft: Secondary | ICD-10-CM | POA: Diagnosis not present

## 2022-10-18 DIAGNOSIS — I25119 Atherosclerotic heart disease of native coronary artery with unspecified angina pectoris: Secondary | ICD-10-CM

## 2022-10-18 HISTORY — PX: RIGHT/LEFT HEART CATH AND CORONARY ANGIOGRAPHY: CATH118266

## 2022-10-18 LAB — POCT I-STAT EG7
Acid-Base Excess: 1 mmol/L (ref 0.0–2.0)
Acid-Base Excess: 2 mmol/L (ref 0.0–2.0)
Bicarbonate: 28.8 mmol/L — ABNORMAL HIGH (ref 20.0–28.0)
Bicarbonate: 29.5 mmol/L — ABNORMAL HIGH (ref 20.0–28.0)
Calcium, Ion: 1.26 mmol/L (ref 1.15–1.40)
Calcium, Ion: 1.26 mmol/L (ref 1.15–1.40)
HCT: 41 % (ref 39.0–52.0)
HCT: 43 % (ref 39.0–52.0)
Hemoglobin: 13.9 g/dL (ref 13.0–17.0)
Hemoglobin: 14.6 g/dL (ref 13.0–17.0)
O2 Saturation: 69 %
O2 Saturation: 70 %
Potassium: 4.2 mmol/L (ref 3.5–5.1)
Potassium: 4.3 mmol/L (ref 3.5–5.1)
Sodium: 140 mmol/L (ref 135–145)
Sodium: 140 mmol/L (ref 135–145)
TCO2: 30 mmol/L (ref 22–32)
TCO2: 31 mmol/L (ref 22–32)
pCO2, Ven: 55.3 mmHg (ref 44–60)
pCO2, Ven: 55.8 mmHg (ref 44–60)
pH, Ven: 7.325 (ref 7.25–7.43)
pH, Ven: 7.331 (ref 7.25–7.43)
pO2, Ven: 40 mmHg (ref 32–45)
pO2, Ven: 40 mmHg (ref 32–45)

## 2022-10-18 LAB — POCT I-STAT 7, (LYTES, BLD GAS, ICA,H+H)
Acid-Base Excess: 1 mmol/L (ref 0.0–2.0)
Bicarbonate: 27 mmol/L (ref 20.0–28.0)
Calcium, Ion: 1.26 mmol/L (ref 1.15–1.40)
HCT: 42 % (ref 39.0–52.0)
Hemoglobin: 14.3 g/dL (ref 13.0–17.0)
O2 Saturation: 99 %
Potassium: 4.2 mmol/L (ref 3.5–5.1)
Sodium: 140 mmol/L (ref 135–145)
TCO2: 28 mmol/L (ref 22–32)
pCO2 arterial: 49.2 mmHg — ABNORMAL HIGH (ref 32–48)
pH, Arterial: 7.347 — ABNORMAL LOW (ref 7.35–7.45)
pO2, Arterial: 128 mmHg — ABNORMAL HIGH (ref 83–108)

## 2022-10-18 SURGERY — RIGHT/LEFT HEART CATH AND CORONARY ANGIOGRAPHY
Anesthesia: LOCAL

## 2022-10-18 MED ORDER — SODIUM CHLORIDE 0.9 % WEIGHT BASED INFUSION
1.0000 mL/kg/h | INTRAVENOUS | Status: DC
  Administered 2022-10-18: 1 mL/kg/h via INTRAVENOUS

## 2022-10-18 MED ORDER — HYDRALAZINE HCL 20 MG/ML IJ SOLN: 10.0000 mg | INTRAMUSCULAR | Status: DC | PRN

## 2022-10-18 MED ORDER — DIAZEPAM 5 MG PO TABS: 5.0000 mg | ORAL_TABLET | Freq: Four times a day (QID) | ORAL | Status: DC | PRN

## 2022-10-18 MED ORDER — HEPARIN SODIUM (PORCINE) 1000 UNIT/ML IJ SOLN
INTRAMUSCULAR | Status: DC | PRN
  Administered 2022-10-18: 4000 [IU] via INTRAVENOUS

## 2022-10-18 MED ORDER — ACETAMINOPHEN 325 MG PO TABS: 650.0000 mg | ORAL_TABLET | ORAL | Status: DC | PRN

## 2022-10-18 MED ORDER — VERAPAMIL HCL 2.5 MG/ML IV SOLN
INTRAVENOUS | Status: DC | PRN
  Administered 2022-10-18: 10 mL via INTRA_ARTERIAL

## 2022-10-18 MED ORDER — SODIUM CHLORIDE 0.9 % IV SOLN: INTRAVENOUS | Status: DC

## 2022-10-18 MED ORDER — HEPARIN (PORCINE) IN NACL 1000-0.9 UT/500ML-% IV SOLN
INTRAVENOUS | Status: DC | PRN
  Administered 2022-10-18 (×2): 500 mL

## 2022-10-18 MED ORDER — MIDAZOLAM HCL 2 MG/2ML IJ SOLN
INTRAMUSCULAR | Status: AC
  Filled 2022-10-18: qty 2

## 2022-10-18 MED ORDER — ONDANSETRON HCL 4 MG/2ML IJ SOLN: 4.0000 mg | Freq: Four times a day (QID) | INTRAMUSCULAR | Status: DC | PRN

## 2022-10-18 MED ORDER — SODIUM CHLORIDE 0.9% FLUSH: 3.0000 mL | Freq: Two times a day (BID) | INTRAVENOUS | Status: DC

## 2022-10-18 MED ORDER — IOHEXOL 350 MG/ML SOLN
INTRAVENOUS | Status: DC | PRN
  Administered 2022-10-18: 55 mL

## 2022-10-18 MED ORDER — VERAPAMIL HCL 2.5 MG/ML IV SOLN
INTRAVENOUS | Status: AC
  Filled 2022-10-18: qty 2

## 2022-10-18 MED ORDER — FENTANYL CITRATE (PF) 100 MCG/2ML IJ SOLN
INTRAMUSCULAR | Status: DC | PRN
  Administered 2022-10-18 (×2): 25 ug via INTRAVENOUS

## 2022-10-18 MED ORDER — AMLODIPINE BESYLATE 5 MG PO TABS: 7.5000 mg | ORAL_TABLET | Freq: Every day | ORAL | 2 refills | Status: DC

## 2022-10-18 MED ORDER — HEPARIN SODIUM (PORCINE) 1000 UNIT/ML IJ SOLN
INTRAMUSCULAR | Status: AC
  Filled 2022-10-18: qty 10

## 2022-10-18 MED ORDER — SODIUM CHLORIDE 0.9% FLUSH: 3.0000 mL | INTRAVENOUS | Status: DC | PRN

## 2022-10-18 MED ORDER — ASPIRIN 81 MG PO CHEW: 81.0000 mg | CHEWABLE_TABLET | Freq: Every day | ORAL | Status: DC

## 2022-10-18 MED ORDER — SODIUM CHLORIDE 0.9 % IV SOLN: 250.0000 mL | INTRAVENOUS | Status: DC | PRN

## 2022-10-18 MED ORDER — CLOPIDOGREL BISULFATE 75 MG PO TABS: 75.0000 mg | ORAL_TABLET | Freq: Every day | ORAL | Status: DC

## 2022-10-18 MED ORDER — METOPROLOL TARTRATE 50 MG PO TABS: ORAL_TABLET | ORAL | 3 refills | Status: DC

## 2022-10-18 MED ORDER — LABETALOL HCL 5 MG/ML IV SOLN: 10.0000 mg | INTRAVENOUS | Status: DC | PRN

## 2022-10-18 MED ORDER — SODIUM CHLORIDE 0.9 % WEIGHT BASED INFUSION
3.0000 mL/kg/h | INTRAVENOUS | Status: AC
  Administered 2022-10-18: 3 mL/kg/h via INTRAVENOUS

## 2022-10-18 MED ORDER — MIDAZOLAM HCL 2 MG/2ML IJ SOLN
INTRAMUSCULAR | Status: DC | PRN
  Administered 2022-10-18: 1 mg via INTRAVENOUS
  Administered 2022-10-18: 2 mg via INTRAVENOUS

## 2022-10-18 MED ORDER — LIDOCAINE HCL (PF) 1 % IJ SOLN
INTRAMUSCULAR | Status: AC
  Filled 2022-10-18: qty 30

## 2022-10-18 MED ORDER — FENTANYL CITRATE (PF) 100 MCG/2ML IJ SOLN
INTRAMUSCULAR | Status: AC
  Filled 2022-10-18: qty 2

## 2022-10-18 MED ORDER — LIDOCAINE HCL (PF) 1 % IJ SOLN
INTRAMUSCULAR | Status: DC | PRN
  Administered 2022-10-18 (×2): 5 mL via INTRADERMAL

## 2022-10-18 SURGICAL SUPPLY — 13 items
CATH BALLN WEDGE 5F 110CM (CATHETERS) IMPLANT
CATH OPTITORQUE TIG 4.0 5F (CATHETERS) IMPLANT
DEVICE RAD COMP TR BAND LRG (VASCULAR PRODUCTS) IMPLANT
GLIDESHEATH SLEND SS 6F .021 (SHEATH) IMPLANT
GUIDEWIRE .025 260CM (WIRE) IMPLANT
GUIDEWIRE INQWIRE 1.5J.035X260 (WIRE) IMPLANT
INQWIRE 1.5J .035X260CM (WIRE) ×1
KIT HEART LEFT (KITS) ×1 IMPLANT
PACK CARDIAC CATHETERIZATION (CUSTOM PROCEDURE TRAY) ×1 IMPLANT
SHEATH GLIDE SLENDER 4/5FR (SHEATH) IMPLANT
SHEATH PROBE COVER 6X72 (BAG) IMPLANT
TRANSDUCER W/STOPCOCK (MISCELLANEOUS) ×1 IMPLANT
TUBING CIL FLEX 10 FLL-RA (TUBING) ×1 IMPLANT

## 2022-10-18 NOTE — Interval H&P Note (Signed)
Cath Lab Visit (complete for each Cath Lab visit)  Clinical Evaluation Leading to the Procedure:   ACS: No.  Non-ACS:    Anginal Classification: CCS II  Anti-ischemic medical therapy: Maximal Therapy (2 or more classes of medications)  Non-Invasive Test Results: No non-invasive testing performed  Prior CABG: No previous CABG      History and Physical Interval Note:  10/18/2022 7:50 AM  Maurice Munoz  has presented today for surgery, with the diagnosis of hp - cad - chest pain.  The various methods of treatment have been discussed with the patient and family. After consideration of risks, benefits and other options for treatment, the patient has consented to  Procedure(s): RIGHT/LEFT HEART CATH AND CORONARY ANGIOGRAPHY (N/A) as a surgical intervention.  The patient's history has been reviewed, patient examined, no change in status, stable for surgery.  I have reviewed the patient's chart and labs.  Questions were answered to the patient's satisfaction.     Maurice Munoz

## 2022-10-18 NOTE — Discharge Instructions (Signed)

## 2022-10-19 ENCOUNTER — Encounter (HOSPITAL_COMMUNITY): Payer: Self-pay | Admitting: Cardiovascular Disease

## 2022-10-21 ENCOUNTER — Ambulatory Visit: Payer: Medicare Other | Admitting: Student

## 2022-11-26 ENCOUNTER — Emergency Department (HOSPITAL_COMMUNITY): Payer: Medicare Other

## 2022-11-26 ENCOUNTER — Inpatient Hospital Stay (HOSPITAL_COMMUNITY)
Admission: EM | Admit: 2022-11-26 | Discharge: 2022-11-30 | DRG: 189 | Disposition: A | Discharge status: Rehabilitation Facility | Payer: Medicare Other | Attending: Internal Medicine | Admitting: Internal Medicine

## 2022-11-26 ENCOUNTER — Other Ambulatory Visit: Payer: Self-pay

## 2022-11-26 DIAGNOSIS — S0081XD Abrasion of other part of head, subsequent encounter: Secondary | ICD-10-CM | POA: Diagnosis not present

## 2022-11-26 DIAGNOSIS — Z23 Encounter for immunization: Secondary | ICD-10-CM | POA: Diagnosis present

## 2022-11-26 DIAGNOSIS — Z955 Presence of coronary angioplasty implant and graft: Secondary | ICD-10-CM | POA: Diagnosis not present

## 2022-11-26 DIAGNOSIS — G894 Chronic pain syndrome: Secondary | ICD-10-CM | POA: Diagnosis present

## 2022-11-26 DIAGNOSIS — Z7902 Long term (current) use of antithrombotics/antiplatelets: Secondary | ICD-10-CM

## 2022-11-26 DIAGNOSIS — N179 Acute kidney failure, unspecified: Secondary | ICD-10-CM | POA: Diagnosis present

## 2022-11-26 DIAGNOSIS — Z87891 Personal history of nicotine dependence: Secondary | ICD-10-CM | POA: Diagnosis not present

## 2022-11-26 DIAGNOSIS — Z66 Do not resuscitate: Secondary | ICD-10-CM | POA: Diagnosis present

## 2022-11-26 DIAGNOSIS — Z7982 Long term (current) use of aspirin: Secondary | ICD-10-CM

## 2022-11-26 DIAGNOSIS — I50810 Right heart failure, unspecified: Secondary | ICD-10-CM | POA: Diagnosis not present

## 2022-11-26 DIAGNOSIS — Z880 Allergy status to penicillin: Secondary | ICD-10-CM

## 2022-11-26 DIAGNOSIS — E8809 Other disorders of plasma-protein metabolism, not elsewhere classified: Secondary | ICD-10-CM | POA: Diagnosis not present

## 2022-11-26 DIAGNOSIS — Z833 Family history of diabetes mellitus: Secondary | ICD-10-CM | POA: Diagnosis not present

## 2022-11-26 DIAGNOSIS — Z79899 Other long term (current) drug therapy: Secondary | ICD-10-CM

## 2022-11-26 DIAGNOSIS — W19XXXD Unspecified fall, subsequent encounter: Secondary | ICD-10-CM | POA: Diagnosis present

## 2022-11-26 DIAGNOSIS — S0031XD Abrasion of nose, subsequent encounter: Secondary | ICD-10-CM | POA: Diagnosis not present

## 2022-11-26 DIAGNOSIS — Y92009 Unspecified place in unspecified non-institutional (private) residence as the place of occurrence of the external cause: Secondary | ICD-10-CM | POA: Diagnosis not present

## 2022-11-26 DIAGNOSIS — J8410 Pulmonary fibrosis, unspecified: Secondary | ICD-10-CM | POA: Diagnosis not present

## 2022-11-26 DIAGNOSIS — Z515 Encounter for palliative care: Secondary | ICD-10-CM | POA: Diagnosis not present

## 2022-11-26 DIAGNOSIS — E785 Hyperlipidemia, unspecified: Secondary | ICD-10-CM | POA: Diagnosis present

## 2022-11-26 DIAGNOSIS — M109 Gout, unspecified: Secondary | ICD-10-CM | POA: Diagnosis present

## 2022-11-26 DIAGNOSIS — I5082 Biventricular heart failure: Secondary | ICD-10-CM | POA: Diagnosis present

## 2022-11-26 DIAGNOSIS — I700 Atherosclerosis of aorta: Secondary | ICD-10-CM | POA: Diagnosis not present

## 2022-11-26 DIAGNOSIS — R0902 Hypoxemia: Secondary | ICD-10-CM | POA: Diagnosis not present

## 2022-11-26 DIAGNOSIS — K859 Acute pancreatitis without necrosis or infection, unspecified: Secondary | ICD-10-CM | POA: Diagnosis present

## 2022-11-26 DIAGNOSIS — I10 Essential (primary) hypertension: Secondary | ICD-10-CM | POA: Diagnosis not present

## 2022-11-26 DIAGNOSIS — J9621 Acute and chronic respiratory failure with hypoxia: Principal | ICD-10-CM | POA: Diagnosis present

## 2022-11-26 DIAGNOSIS — E669 Obesity, unspecified: Secondary | ICD-10-CM | POA: Diagnosis present

## 2022-11-26 DIAGNOSIS — I2510 Atherosclerotic heart disease of native coronary artery without angina pectoris: Secondary | ICD-10-CM | POA: Diagnosis present

## 2022-11-26 DIAGNOSIS — Z8249 Family history of ischemic heart disease and other diseases of the circulatory system: Secondary | ICD-10-CM

## 2022-11-26 DIAGNOSIS — W19XXXA Unspecified fall, initial encounter: Secondary | ICD-10-CM | POA: Diagnosis not present

## 2022-11-26 DIAGNOSIS — G8929 Other chronic pain: Secondary | ICD-10-CM | POA: Diagnosis not present

## 2022-11-26 DIAGNOSIS — D696 Thrombocytopenia, unspecified: Secondary | ICD-10-CM | POA: Diagnosis present

## 2022-11-26 DIAGNOSIS — R739 Hyperglycemia, unspecified: Secondary | ICD-10-CM | POA: Diagnosis not present

## 2022-11-26 DIAGNOSIS — R5381 Other malaise: Secondary | ICD-10-CM | POA: Diagnosis not present

## 2022-11-26 DIAGNOSIS — J849 Interstitial pulmonary disease, unspecified: Secondary | ICD-10-CM | POA: Diagnosis not present

## 2022-11-26 DIAGNOSIS — I130 Hypertensive heart and chronic kidney disease with heart failure and stage 1 through stage 4 chronic kidney disease, or unspecified chronic kidney disease: Secondary | ICD-10-CM | POA: Diagnosis not present

## 2022-11-26 DIAGNOSIS — J84112 Idiopathic pulmonary fibrosis: Secondary | ICD-10-CM | POA: Diagnosis present

## 2022-11-26 DIAGNOSIS — Z981 Arthrodesis status: Secondary | ICD-10-CM

## 2022-11-26 DIAGNOSIS — T447X5A Adverse effect of beta-adrenoreceptor antagonists, initial encounter: Secondary | ICD-10-CM | POA: Diagnosis not present

## 2022-11-26 DIAGNOSIS — Z7189 Other specified counseling: Secondary | ICD-10-CM | POA: Diagnosis not present

## 2022-11-26 DIAGNOSIS — N1832 Chronic kidney disease, stage 3b: Secondary | ICD-10-CM | POA: Diagnosis not present

## 2022-11-26 DIAGNOSIS — Z888 Allergy status to other drugs, medicaments and biological substances status: Secondary | ICD-10-CM

## 2022-11-26 DIAGNOSIS — I5033 Acute on chronic diastolic (congestive) heart failure: Secondary | ICD-10-CM | POA: Diagnosis not present

## 2022-11-26 DIAGNOSIS — T07XXXA Unspecified multiple injuries, initial encounter: Secondary | ICD-10-CM

## 2022-11-26 DIAGNOSIS — J9601 Acute respiratory failure with hypoxia: Secondary | ICD-10-CM | POA: Diagnosis not present

## 2022-11-26 DIAGNOSIS — M25561 Pain in right knee: Secondary | ICD-10-CM | POA: Diagnosis present

## 2022-11-26 DIAGNOSIS — I2729 Other secondary pulmonary hypertension: Secondary | ICD-10-CM | POA: Diagnosis present

## 2022-11-26 DIAGNOSIS — M1711 Unilateral primary osteoarthritis, right knee: Secondary | ICD-10-CM | POA: Diagnosis not present

## 2022-11-26 DIAGNOSIS — I6522 Occlusion and stenosis of left carotid artery: Secondary | ICD-10-CM | POA: Diagnosis present

## 2022-11-26 DIAGNOSIS — R55 Syncope and collapse: Principal | ICD-10-CM

## 2022-11-26 DIAGNOSIS — Z6824 Body mass index (BMI) 24.0-24.9, adult: Secondary | ICD-10-CM

## 2022-11-26 DIAGNOSIS — S0081XA Abrasion of other part of head, initial encounter: Secondary | ICD-10-CM | POA: Diagnosis present

## 2022-11-26 DIAGNOSIS — J9611 Chronic respiratory failure with hypoxia: Secondary | ICD-10-CM | POA: Diagnosis not present

## 2022-11-26 DIAGNOSIS — I25810 Atherosclerosis of coronary artery bypass graft(s) without angina pectoris: Secondary | ICD-10-CM | POA: Diagnosis not present

## 2022-11-26 DIAGNOSIS — I272 Pulmonary hypertension, unspecified: Secondary | ICD-10-CM | POA: Diagnosis not present

## 2022-11-26 DIAGNOSIS — N183 Chronic kidney disease, stage 3 unspecified: Secondary | ICD-10-CM | POA: Diagnosis present

## 2022-11-26 DIAGNOSIS — I959 Hypotension, unspecified: Secondary | ICD-10-CM | POA: Diagnosis not present

## 2022-11-26 LAB — CBC WITH DIFFERENTIAL/PLATELET
Abs Immature Granulocytes: 0.04 10*3/uL (ref 0.00–0.07)
Basophils Absolute: 0 10*3/uL (ref 0.0–0.1)
Basophils Relative: 0 %
Eosinophils Absolute: 0.1 10*3/uL (ref 0.0–0.5)
Eosinophils Relative: 1 %
HCT: 45.1 % (ref 39.0–52.0)
Hemoglobin: 14.4 g/dL (ref 13.0–17.0)
Immature Granulocytes: 1 %
Lymphocytes Relative: 13 %
Lymphs Abs: 0.9 10*3/uL (ref 0.7–4.0)
MCH: 30.7 pg (ref 26.0–34.0)
MCHC: 31.9 g/dL (ref 30.0–36.0)
MCV: 96.2 fL (ref 80.0–100.0)
Monocytes Absolute: 0.4 10*3/uL (ref 0.1–1.0)
Monocytes Relative: 6 %
Neutro Abs: 5.5 10*3/uL (ref 1.7–7.7)
Neutrophils Relative %: 79 %
Platelets: 197 10*3/uL (ref 150–400)
RBC: 4.69 MIL/uL (ref 4.22–5.81)
RDW: 14.8 % (ref 11.5–15.5)
WBC: 7 10*3/uL (ref 4.0–10.5)
nRBC: 0 % (ref 0.0–0.2)

## 2022-11-26 LAB — BASIC METABOLIC PANEL
Anion gap: 9 (ref 5–15)
BUN: 30 mg/dL — ABNORMAL HIGH (ref 8–23)
CO2: 26 mmol/L (ref 22–32)
Calcium: 8.7 mg/dL — ABNORMAL LOW (ref 8.9–10.3)
Chloride: 103 mmol/L (ref 98–111)
Creatinine, Ser: 1.44 mg/dL — ABNORMAL HIGH (ref 0.61–1.24)
GFR, Estimated: 51 mL/min — ABNORMAL LOW (ref >60)
Glucose, Bld: 117 mg/dL — ABNORMAL HIGH (ref 70–99)
Potassium: 4.3 mmol/L (ref 3.5–5.1)
Sodium: 138 mmol/L (ref 135–145)

## 2022-11-26 LAB — CBG MONITORING, ED: Glucose-Capillary: 96 mg/dL (ref 70–99)

## 2022-11-26 LAB — TROPONIN I (HIGH SENSITIVITY)
Troponin I (High Sensitivity): 22 ng/L — ABNORMAL HIGH (ref <18)
Troponin I (High Sensitivity): 22 ng/L — ABNORMAL HIGH (ref <18)

## 2022-11-26 LAB — BRAIN NATRIURETIC PEPTIDE: B Natriuretic Peptide: 975 pg/mL — ABNORMAL HIGH (ref 0.0–100.0)

## 2022-11-26 MED ORDER — IOHEXOL 350 MG/ML SOLN
120.0000 mL | Freq: Once | INTRAVENOUS | Status: AC | PRN
  Administered 2022-11-26: 120 mL via INTRAVENOUS

## 2022-11-26 MED ORDER — PANTOPRAZOLE SODIUM 40 MG PO TBEC
40.0000 mg | DELAYED_RELEASE_TABLET | Freq: Every day | ORAL | Status: DC
  Administered 2022-11-26 – 2022-11-30 (×5): 40 mg via ORAL
  Filled 2022-11-26 (×5): qty 1

## 2022-11-26 MED ORDER — SODIUM CHLORIDE 0.9 % IV SOLN
2.0000 g | INTRAVENOUS | Status: DC
  Administered 2022-11-26 – 2022-11-28 (×3): 2 g via INTRAVENOUS
  Filled 2022-11-26 (×3): qty 20

## 2022-11-26 MED ORDER — FUROSEMIDE 10 MG/ML IJ SOLN
40.0000 mg | Freq: Once | INTRAMUSCULAR | Status: AC
  Administered 2022-11-26: 40 mg via INTRAVENOUS
  Filled 2022-11-26: qty 4

## 2022-11-26 MED ORDER — MAGNESIUM SULFATE 2 GM/50ML IV SOLN
2.0000 g | Freq: Once | INTRAVENOUS | Status: AC
  Administered 2022-11-26: 2 g via INTRAVENOUS
  Filled 2022-11-26: qty 50

## 2022-11-26 MED ORDER — METHYLPREDNISOLONE SODIUM SUCC 125 MG IJ SOLR
125.0000 mg | Freq: Once | INTRAMUSCULAR | Status: AC
  Administered 2022-11-26: 125 mg via INTRAVENOUS
  Filled 2022-11-26: qty 2

## 2022-11-26 MED ORDER — SODIUM CHLORIDE 0.9 % IV SOLN: 500.0000 mg | INTRAVENOUS | Status: DC

## 2022-11-26 MED ORDER — SODIUM CHLORIDE 0.9 % IV BOLUS
500.0000 mL | Freq: Once | INTRAVENOUS | Status: AC
  Administered 2022-11-26: 500 mL via INTRAVENOUS

## 2022-11-26 MED ORDER — TETANUS-DIPHTH-ACELL PERTUSSIS 5-2.5-18.5 LF-MCG/0.5 IM SUSY
0.5000 mL | PREFILLED_SYRINGE | Freq: Once | INTRAMUSCULAR | Status: AC
  Administered 2022-11-26: 0.5 mL via INTRAMUSCULAR
  Filled 2022-11-26: qty 0.5

## 2022-11-26 MED ORDER — METHYLPREDNISOLONE SODIUM SUCC 125 MG IJ SOLR
125.0000 mg | Freq: Every day | INTRAMUSCULAR | Status: DC
  Administered 2022-11-27: 125 mg via INTRAVENOUS
  Filled 2022-11-26: qty 2

## 2022-11-26 MED ORDER — IPRATROPIUM-ALBUTEROL 0.5-2.5 (3) MG/3ML IN SOLN
3.0000 mL | Freq: Once | RESPIRATORY_TRACT | Status: AC
  Administered 2022-11-26: 3 mL via RESPIRATORY_TRACT
  Filled 2022-11-26: qty 3

## 2022-11-26 MED ORDER — SODIUM CHLORIDE 0.9 % IV SOLN
500.0000 mg | INTRAVENOUS | Status: DC
  Administered 2022-11-26 – 2022-11-28 (×3): 500 mg via INTRAVENOUS
  Filled 2022-11-26 (×3): qty 5

## 2022-11-26 NOTE — ED Notes (Signed)
Patient transported to MRI 

## 2022-11-26 NOTE — ED Notes (Signed)
Graham crackers, peanut butter and drink given to pt.

## 2022-11-26 NOTE — Discharge Instructions (Signed)
Follow-up with your doctor.  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  Try and keep yourself hydrated over the next couple days.

## 2022-11-26 NOTE — ED Notes (Signed)
RT Paged to bedside

## 2022-11-26 NOTE — ED Notes (Signed)
Pt was ready for discharge, when pt went to sit up on the side of the bed pt O2 drop to the low 80s, and he felt like he was getting ready to pass out. EDP aware and at bedside.

## 2022-11-26 NOTE — ED Notes (Signed)
EDP aware of decrease saturation on 4lpm . RT aware for assessment.

## 2022-11-26 NOTE — ED Provider Notes (Addendum)
  Physical Exam  BP 132/72   Pulse 78   Temp 97.8 F (36.6 C) (Oral)   Resp (!) 28   Ht 6\' 1"  (1.854 m)   Wt 83.9 kg   SpO2 95%   BMI 24.41 kg/m   Physical Exam  Procedures  Procedures  ED Course / MDM   Clinical Course as of 11/26/22 1747  Fri Nov 26, 2022  1434 Reviewed MRI finding with Dr. Selina Cooley neurology.  She is recommending the patient get a CTA head and neck.  She said if there is critical stenosis this may need intervention but if it is already completely stenosed then no emergent need. [MB]    Clinical Course User Index [MB] Terrilee Files, MD   Medical Decision Making Amount and/or Complexity of Data Reviewed Labs: ordered. Radiology: ordered.  Risk Prescription drug management.  Received patient in signout.  Syncope.  Had carotid disease.  CTA done and showed occlusion of the left and 55% on right.  Not significant disease.  Had messaged with Dr. Selina Cooley.  Follow-up as an outpatient.  Not hypoxic.  Creatinine to slightly above normal.  Follow-up with PCP.  Creatinine mildly increased but when patient tried to walk desatted down into the 80s.  Required fair amount of increase of the oxygen.  CT scan did show potentially some volume overload.  Potentially the hypoxia as a cause of the syncope.  Will discuss with hospitalist for admission.  History of pulmonary fibrosis and pulmonary hypertension.      Benjiman Core, MD 11/26/22 Evette Doffing    Benjiman Core, MD 11/26/22 [phone]

## 2022-11-26 NOTE — ED Triage Notes (Signed)
Pt arrived [facility] with c/o syncope and fell. Pt was walking to his truck and felt dizzy and then sat down on his gator . Pt then woke up on the asphalt called his family and they helped him up. Pt then passed out again for roughly 10 minutes. Pt stated he has been passing out while walking for months.

## 2022-11-26 NOTE — ED Notes (Signed)
EDP aware of oxygen decrease episodes x 2.

## 2022-11-26 NOTE — ED Notes (Signed)
While pt was in MRI pt oxygen declined during transfer from stretcher to MRI table. Nurse assessed pt and pt oxygen returned to normal. Pt oxygen noted to decrease while talking with nurse. Pt oxygen was at 4lpm . Nurse redirected pt to stop taling and breathe through his nose.Oxygen increased.

## 2022-11-26 NOTE — ED Provider Notes (Signed)
[place] EMERGENCY DEPARTMENT AT [facility] Provider Note   CSN: [id number] Arrival date & time: 11/26/22  6213     History  Chief Complaint  Patient presents with   Loss of Consciousness   Fall    Maurice Munoz is a 77 y.o. male.  Patient brought in by EMS for evaluation of syncopal event x 2.  He said he was outside getting ready to go to a doctor's appointment when he began to feel lightheaded.  He sat down on something but then had an unwitnessed syncopal event.  Family found him on the ground.  They tried to sit him up where he had another syncopal event.  EMS states that family said he was unconscious for 15 minutes.  They found patient initially hypotensive in the 70s and gave him IV fluids.  Blood sugar was 131.  Patient states he feels sore all over but otherwise well.  He said he has had multiple syncopal events in the past.  He is on Plavix.  He states he has been eating and drinking well.  He has baseline shortness of breath and is on 3 L nasal cannula.  He states he was wearing it when he had his event.  The history is provided by the patient and the EMS personnel.  Loss of Consciousness Episode history:  Multiple Most recent episode:  Today Progression:  Resolved Chronicity:  Recurrent Context: normal activity   Witnessed: yes   Relieved by:  Lying down Ineffective treatments:  None tried Associated symptoms: shortness of breath (baseline per patient)   Associated symptoms: no chest pain, no fever, no focal weakness, no headaches, no nausea, no visual change and no vomiting   Risk factors: coronary artery disease   Fall Associated symptoms include shortness of breath (baseline per patient). Pertinent negatives include no chest pain and no headaches.       Home Medications Prior to Admission medications   Medication Sig Start Date End Date Taking? Authorizing Provider  acetaminophen (TYLENOL) 500 MG tablet Take 1,000 mg by mouth every 6 (six) hours  as needed (pain.).    [provider]  allopurinol (ZYLOPRIM) 300 MG tablet Take 300 mg by mouth in the morning. 11/06/12   [provider]  amLODipine (NORVASC) 5 MG tablet Take 1.5 tablets (7.5 mg total) by mouth daily. 10/18/22   Lennette Bihari, MD  aspirin EC 81 MG tablet Take 81 mg by mouth in the morning.    [provider]  atorvastatin (LIPITOR) 80 MG tablet TAKE ONE TABLET (80MG  TOTAL) BY MOUTH AT6PM 02/17/22   Lennette Bihari, MD  benzonatate (TESSALON) 200 MG capsule TAKE ONE CAPSULE (200MG  TOTAL) BY MOUTH TWO TIMES DAILY AS NEEDED FOR COUGH 07/23/20   Mannam, Praveen, MD  clopidogrel (PLAVIX) 75 MG tablet TAKE ONE (1) TABLET BY MOUTH EVERY DAY 03/22/22   Lennette Bihari, MD  cyanocobalamin (VITAMIN B12) 1000 MCG tablet Take 1,000 mcg by mouth in the morning.    [provider]  ESBRIET 801 MG TABS Take 1 tablet (801 mg total) by mouth 3 (three) times daily with meals. Patient taking differently: Take 1 tablet by mouth See admin instructions. Take 1 tablet (801 mg) by mouth up to 3 times daily with meals. 08/06/22   Mannam, Colbert Coyer, MD  isosorbide mononitrate (IMDUR) 60 MG 24 hr tablet Take 60 mg  ( 1 tablet)  in the morning and  30 mg ( 1/2 tablet )  in  the evening 09/28/22   Lennette Bihari, MD  lisinopril (ZESTRIL) 2.5 MG tablet TAKE ONE TABLET (2.5MG  TOTAL) BY MOUTH DAILY 05/31/22   Lennette Bihari, MD  metoprolol tartrate (LOPRESSOR) 50 MG tablet Take 2 tablets (100 mg total) by mouth every morning AND 1.5 tablets (75 mg total) every evening. 10/18/22   Lennette Bihari, MD  nitroGLYCERIN (NITROSTAT) 0.4 MG SL tablet Place 1 tablet (0.4 mg total) under the tongue every 5 (five) minutes as needed for chest pain. 08/03/17 10/14/23  Lennette Bihari, MD  oxyCODONE-acetaminophen (PERCOCET) 10-325 MG per tablet Take 1 tablet by mouth every 6 (six) hours as needed for pain. 10/11/12   [provider]  OXYGEN Inhale 4-5 L into the lungs as needed (respiratory  issues/with activity).    [provider]  predniSONE (DELTASONE) 10 MG tablet Take 10-60 mg by mouth daily as needed (gout flares).    [provider]  prochlorperazine (COMPAZINE) 10 MG tablet TAKE ONE TABLET (10MG  TOTAL) BY MOUTH EVERY SIX HOURS AS NEEDED FOR NAUSEA OR VOMITING Patient taking differently: Take 10 mg by mouth every 6 (six) hours as needed for nausea or vomiting. 01/07/21   Mannam, Praveen, MD  tiZANidine (ZANAFLEX) 4 MG tablet Take 4 mg by mouth at bedtime.  11/08/12   [provider]  torsemide (DEMADEX) 20 MG tablet TAKE ONE TABLET (20MG  TOTAL) BY MOUTH DAILY. IF ADDITIONAL SWELLING, MAY TAKE AN ADDITIONAL DOSE IN THE AFTERNOON Patient taking differently: Take 20 mg by mouth 2 (two) times a week. May take an additional dose the afternoon 02/16/21   Lennette Bihari, MD  VITAMIN D PO Take 5,000 Units by mouth in the morning.    [provider]      Allergies    Ibuprofen, Neosporin [neomycin-bacitracin zn-polymyx], and Penicillins    Review of Systems   Review of Systems  Constitutional:  Negative for fever.  Eyes:  Negative for visual disturbance.  Respiratory:  Positive for shortness of breath (baseline per patient).   Cardiovascular:  Positive for syncope. Negative for chest pain.  Gastrointestinal:  Negative for nausea and vomiting.  Musculoskeletal:  Negative for neck pain.  Skin:  Positive for wound.  Neurological:  Negative for focal weakness and headaches.    Physical Exam Updated Vital Signs BP 122/72   Pulse 90   Temp (!) 97.4 F (36.3 C) (Oral)   Resp (!) 25   Ht 6\' 1"  (1.854 m)   Wt 83.9 kg   SpO2 91%   BMI 24.41 kg/m  Physical Exam Vitals and nursing note reviewed.  Constitutional:      General: He is not in acute distress.    Appearance: Normal appearance. He is well-developed.  HENT:     Head: Normocephalic.     Comments: He has some abrasions over his forehead and the bridge of his nose Eyes:      Conjunctiva/sclera: Conjunctivae normal.  Cardiovascular:     Rate and Rhythm: Normal rate and regular rhythm.     Heart sounds: No murmur heard. Pulmonary:     Effort: Pulmonary effort is normal. No respiratory distress.     Breath sounds: Normal breath sounds.  Abdominal:     Palpations: Abdomen is soft.     Tenderness: There is no abdominal tenderness.  Musculoskeletal:        General: Tenderness present. No deformity. Normal range of motion.     Cervical back: Neck supple.     Comments:  He has abrasions of his right upper arm right elbow right hand left hand bilateral knees.  Full range of motion without any significant pain or limitations.  No gross deformities.  Skin:    General: Skin is warm and dry.     Capillary Refill: Capillary refill takes less than 2 seconds.  Neurological:     General: No focal deficit present.     Mental Status: He is alert and oriented to person, place, and time.     Cranial Nerves: No cranial nerve deficit.     Sensory: No sensory deficit.     Motor: No weakness.     ED Results / Procedures / Treatments   Labs (all labs ordered are listed, but only abnormal results are displayed) Labs Reviewed  BASIC METABOLIC PANEL - Abnormal; Notable for the following components:      Result Value   Glucose, Bld 117 (*)    BUN 30 (*)    Creatinine, Ser 1.44 (*)    Calcium 8.7 (*)    GFR, Estimated 51 (*)    All other components within normal limits  TROPONIN I (HIGH SENSITIVITY) - Abnormal; Notable for the following components:   Troponin I (High Sensitivity) 22 (*)    All other components within normal limits  TROPONIN I (HIGH SENSITIVITY) - Abnormal; Notable for the following components:   Troponin I (High Sensitivity) 22 (*)    All other components within normal limits  CBC WITH DIFFERENTIAL/PLATELET  CBG MONITORING, ED    EKG EKG Interpretation  Date/Time:  Friday November 26 2022 08:47:21 EDT Ventricular Rate:  90 PR Interval:  184 QRS  Duration: 127 QT Interval:  392 QTC Calculation: 483 R Axis:   89 Text Interpretation: Sinus or ectopic atrial rhythm Nonspecific intraventricular conduction delay Nonspecific repol abnormality, diffuse leads Minimal ST elevation, lateral leads Artifact in lead(s) I II aVR aVL aVF V1 new T wave inversions from prior 10/19 Confirmed by Meridee Score [phone]) on 11/26/2022 9:12:12 AM  Radiology CT Angio Chest PE W/Cm &/Or Wo Cm  Result Date: 11/26/2022 CLINICAL DATA:  Pulmonary embolus suspected with high probability. Syncope and fall. Dizziness. EXAM: CT ANGIOGRAPHY CHEST WITH CONTRAST TECHNIQUE: Multidetector CT imaging of the chest was performed using the standard protocol during bolus administration of intravenous contrast. Multiplanar CT image reconstructions and MIPs were obtained to evaluate the vascular anatomy. RADIATION DOSE REDUCTION: This exam was performed according to the departmental dose-optimization program which includes automated exposure control, adjustment of the mA and/or kV according to patient size and/or use of iterative reconstruction technique. CONTRAST:  OMNIPAQUE IOHEXOL 350 MG/ML SOLN COMPARISON:  08/31/2022 FINDINGS: Cardiovascular: Technically adequate study with good opacification of the central and segmental pulmonary arteries. No focal filling defects. No evidence of significant pulmonary embolus. Cardiac enlargement with right heart predominance. No pericardial effusions. Normal caliber thoracic aorta. No aortic dissection. Calcification in the aorta and coronary arteries. Mediastinum/Nodes: Thyroid gland is unremarkable. Esophagus is decompressed. Mediastinal lymphadenopathy and right hilar lymphadenopathy. Largest left aortopulmonic window nodes measure 1.9 cm short axis dimension. Lymph nodes are enlarged since the prior study. Lungs/Pleura: Severe interstitial infiltrates throughout the lungs with a mostly peripheral and basilar distribution. Appearances are most  consistent with usual interstitial pneumonitis. Severe honeycomb changes particularly in the lung bases. These findings are similar to the prior study. However there is interval development of small bilateral pleural effusions as well as diffuse airspace and interstitial infiltration in the aerated portions of the lungs. Changes most  likely represent developing pulmonary edema although multifocal pneumonia could also have this appearance. Bulla in the right apex. Upper Abdomen: Cholelithiasis with single stone in the gallbladder neck. Gallbladder wall appears mildly thickened, possibly indicating acute cholecystitis if in the appropriate clinical setting. Consider ultrasound for further evaluation if clinically indicated. Otherwise no acute changes demonstrated in the upper abdomen. Musculoskeletal: Degenerative changes in the spine. Postoperative changes in the cervical spine. Review of the MIP images confirms the above findings. IMPRESSION: 1. No evidence of significant pulmonary embolus. 2. Cardiac enlargement with right heart prominence suggesting right heart failure. 3. New finding of bilateral pleural effusions with airspace and interstitial disease throughout the lungs likely indicating pulmonary edema. Multifocal pneumonia would be a less likely possibility. 4. Chronic underlying interstitial lung disease with diffuse peripheral distribution of pulmonary fibrosis and severe honeycomb changes. 5. Mediastinal and right hilar lymphadenopathy is increasing since prior study. Etiology is nonspecific, possibly reactive, neoplastic, or lymphoma. 6. Cholelithiasis with suggestion of gallbladder wall thickening. Consider ultrasound if there is clinical concern for acute cholecystitis. 7. Aortic atherosclerosis. Electronically Signed   By: Burman Nieves M.D.   On: 11/26/2022 16:14   CT ANGIO HEAD NECK W WO CM  Result Date: 11/26/2022 CLINICAL DATA:  Neuro deficit, acute, stroke suspected. EXAM: CT ANGIOGRAPHY  HEAD AND NECK WITH AND WITHOUT CONTRAST TECHNIQUE: Multidetector CT imaging of the head and neck was performed using the standard protocol during bolus administration of intravenous contrast. Multiplanar CT image reconstructions and MIPs were obtained to evaluate the vascular anatomy. Carotid stenosis measurements (when applicable) are obtained utilizing NASCET criteria, using the distal internal carotid diameter as the denominator. RADIATION DOSE REDUCTION: This exam was performed according to the departmental dose-optimization program which includes automated exposure control, adjustment of the mA and/or kV according to patient size and/or use of iterative reconstruction technique. CONTRAST:  OMNIPAQUE IOHEXOL 350 MG/ML SOLN COMPARISON:  MRI brain 11/26/2022. FINDINGS: CTA NECK FINDINGS Aortic arch: Three-vessel arch configuration. Atherosclerotic calcifications of the aortic arch and arch vessel origins. Arch vessel origins are patent. Right carotid system: Predominantly calcified plaque results in 55% stenosis of the proximal right cervical ICA. 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 Reconstitution at the communicating segment. Vertebral arteries: Codominant. No evidence of dissection, stenosis (50% or greater), or occlusion. Skeleton: Prior C3-C7 ACDF and C2-3 posterior spinal fusion. No suspicious bone lesions. Other neck: Unremarkable. Upper chest: Fibrosis and edema with apical bullae. Attention on same day CTA chest report. Review of the MIP images confirms the above findings CTA HEAD FINDINGS Anterior circulation: Reconstitution of the left ICA at the communicating segment. Right ICA is patent. The proximal ACAs and MCAs are patent without stenosis or aneurysm. Distal branches are symmetric. Posterior circulation: Normal basilar artery. The SCAs, AICAs and PICAs are patent proximally. The PCAs are patent proximally without stenosis or  aneurysm. Distal branches are symmetric. Venous sinuses: Patent. Anatomic variants: None. Review of the MIP images confirms the above findings IMPRESSION: 1. 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 2. 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 3. No intracranial large vessel occlusion or high-grade stenosis. Aortic Atherosclerosis (ICD10-I70.0). Electronically Signed   By: Orvan Falconer M.D.   On: 11/26/2022 16:10   MR BRAIN WO CONTRAST  Result Date: 11/26/2022 CLINICAL DATA:  Mental status change, unknown cause. EXAM: MRI HEAD WITHOUT CONTRAST TECHNIQUE: Multiplanar, multiecho pulse  sequences of the brain and surrounding structures were obtained without intravenous contrast. COMPARISON:  Head CT 11/26/2022. FINDINGS: Brain: No acute infarct or hemorrhage. 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 No hydrocephalus or extra-axial collection. No mass or midline shift. No foci of abnormal susceptibility. Vascular: Loss of the left ICA flow void. Skull and upper cervical spine: Partially visualized susceptibility artifact from prior cervical spinal fusion. Otherwise normal marrow signal. Sinuses/Orbits: Unremarkable. Other: None. IMPRESSION: 1. No acute infarct or hemorrhage. Loss of the left ICA flow void. Recommend further characterization with CTA or contrast-enhanced MRA of the head and neck. 2. 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 Electronically Signed   By: Orvan Falconer M.D.   On: 11/26/2022 13:05   DG Hand Complete Left  Result Date: 11/26/2022 CLINICAL DATA:  Fall with left hand pain. EXAM: LEFT HAND - COMPLETE 3+ VIEW COMPARISON:  None Available. FINDINGS: Bone mineralization is normal. Alignment is normal. No acute fracture or dislocation. Punctate radiodensity over the soft tissues adjacent the fourth D IP joint which may represent a tiny foreign body. Mild degenerate changes over the radiocarpal joint, radial  side of the carpal bones, interphalangeal joints as well as the first through third MCP joints. Old ulnar styloid fracture. IMPRESSION: 1. No acute findings. 2. Mild degenerative changes. 3. Possible tiny foreign body over the soft tissues adjacent the fourth DIP joint. Electronically Signed   By: Elberta Fortis M.D.   On: 11/26/2022 10:37   CT HEAD WO CONTRAST  Result Date: 11/26/2022 CLINICAL DATA:  Neck trauma EXAM: CT HEAD WITHOUT CONTRAST CT CERVICAL SPINE WITHOUT CONTRAST TECHNIQUE: Multidetector CT imaging of the head and cervical spine was performed following the standard protocol without intravenous contrast. Multiplanar CT image reconstructions of the cervical spine were also generated. RADIATION DOSE REDUCTION: This exam was performed according to the departmental dose-optimization program which includes automated exposure control, adjustment of the mA and/or kV according to patient size and/or use of iterative reconstruction technique. COMPARISON:  CT C Spine 02/24/10 FINDINGS: CT HEAD FINDINGS Brain: Subtle hypodensity in the anterior left frontal lobe (series 2, image 19) could represent an acute infarct or post-traumatic change. No evidence of hemorrhage, hydrocephalus, extra-axial collection or mass lesion/mass effect. Vascular: No hyperdense vessel or unexpected calcification. Skull: Soft tissue hematoma along the frontal scalp. No evidence calvarial fracture. Sinuses/Orbits: No acute finding. Other: None. CT CERVICAL SPINE FINDINGS Alignment: Straightening of the normal cervical lordosis. Skull base and vertebrae: Status post C3-C7 ACDF and C2-C3 posterior fusion. No acute fracture. No primary bone lesion or focal pathologic process. Soft tissues and spinal canal: No prevertebral fluid or swelling. No visible canal hematoma. Disc levels: Multilevel moderate spinal canal narrowing at C3-C7. Upper chest: Bullous change at the right lung apex. Other: None IMPRESSION: 1. Subtle cortical hypodensity in  the anterior left frontal lobe could represent an acute infarct or post-traumatic change. Brain MRI could be considered if this is a clinical concern. 2. Soft tissue hematoma along the frontal scalp. No evidence of calvarial fracture. 3. No acute cervical spine fracture. Electronically Signed   By: Lorenza Cambridge M.D.   On: 11/26/2022 10:29   CT CERVICAL SPINE WO CONTRAST  Result Date: 11/26/2022 CLINICAL DATA:  Neck trauma EXAM: CT HEAD WITHOUT CONTRAST CT CERVICAL SPINE WITHOUT CONTRAST TECHNIQUE: Multidetector CT imaging of the head and cervical spine was performed following the standard protocol without intravenous contrast. Multiplanar CT image reconstructions of the cervical spine were also generated. RADIATION DOSE REDUCTION: This  exam was performed according to the departmental dose-optimization program which includes automated exposure control, adjustment of the mA and/or kV according to patient size and/or use of iterative reconstruction technique. COMPARISON:  CT C Spine 02/24/10 FINDINGS: CT HEAD FINDINGS Brain: Subtle hypodensity in the anterior left frontal lobe (series 2, image 19) could represent an acute infarct or post-traumatic change. No evidence of hemorrhage, hydrocephalus, extra-axial collection or mass lesion/mass effect. Vascular: No hyperdense vessel or unexpected calcification. Skull: Soft tissue hematoma along the frontal scalp. No evidence calvarial fracture. Sinuses/Orbits: No acute finding. Other: None. CT CERVICAL SPINE FINDINGS Alignment: Straightening of the normal cervical lordosis. Skull base and vertebrae: Status post C3-C7 ACDF and C2-C3 posterior fusion. No acute fracture. No primary bone lesion or focal pathologic process. Soft tissues and spinal canal: No prevertebral fluid or swelling. No visible canal hematoma. Disc levels: Multilevel moderate spinal canal narrowing at C3-C7. Upper chest: Bullous change at the right lung apex. Other: None IMPRESSION: 1. Subtle cortical  hypodensity in the anterior left frontal lobe could represent an acute infarct or post-traumatic change. Brain MRI could be considered if this is a clinical concern. 2. Soft tissue hematoma along the frontal scalp. No evidence of calvarial fracture. 3. No acute cervical spine fracture. Electronically Signed   By: Lorenza Cambridge M.D.   On: 11/26/2022 10:29   DG Elbow Complete Right  Result Date: 11/26/2022 CLINICAL DATA:  Fall with right elbow pain. EXAM: RIGHT ELBOW - COMPLETE 3+ VIEW COMPARISON:  None Available. 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 No significant joint effusion. No evidence of acute fracture or dislocation. IMPRESSION: 1. No acute findings. 2. 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 Electronically Signed   By: Elberta Fortis M.D.   On: 11/26/2022 10:19   DG Hand Complete Right  Result Date: 11/26/2022 CLINICAL DATA:  Fall with left hand pain. EXAM: RIGHT HAND - COMPLETE 3+ VIEW COMPARISON:  None Available. FINDINGS: Alignment and bone mineralization is normal. There are mild degenerative changes over the radiocarpal joint, carpal bones and interphalangeal joints as well as first carpometacarpal joint. No bony erosions. No acute fracture or dislocation. Punctate radiopaque focus over the soft tissues adjacent the volar/ulnar aspect of the base of the second middle phalanx possibly a small foreign body. IMPRESSION: 1. No acute findings. 2. Mild degenerative changes. 3. Possible punctate foreign body over the soft tissues adjacent the base of the second middle phalanx. Electronically Signed   By: Elberta Fortis M.D.   On: 11/26/2022 10:17    Procedures Procedures    Medications Ordered in ED Medications  sodium chloride 0.9 % bolus 500 mL (500 mLs Intravenous Bolus 11/26/22 0924)  Tdap (BOOSTRIX) injection 0.5 mL (0.5 mLs Intramuscular Given 11/26/22 0925)  ipratropium-albuterol (DUONEB) 0.5-2.5 (3) MG/3ML nebulizer solution 3 mL (3 mLs Nebulization Given  11/26/22 1433)  magnesium sulfate IVPB 2 g 50 mL (0 g Intravenous Stopped 11/26/22 1548)  methylPREDNISolone sodium succinate (SOLU-MEDROL) 125 mg/2 mL injection 125 mg (125 mg Intravenous Given 11/26/22 1337)  iohexol (OMNIPAQUE) 350 MG/ML injection 120 mL (120 mLs Intravenous Contrast Given 11/26/22 1524)    ED Course/ Medical Decision Making/ A&P Clinical Course as of 11/26/22 1549  Fri Nov 26, 2022  1434 Reviewed MRI finding with Dr. Selina Cooley neurology.  She is recommending the patient get a CTA head and neck.  She said if there is critical stenosis this may need intervention but if it is already completely stenosed then no emergent need. [MB]    Clinical Course  User Index [MB] Terrilee Files, MD                             Medical Decision Making Amount and/or Complexity of Data Reviewed Labs: ordered. Radiology: ordered.  Risk Prescription drug management.   This patient complains of dizziness syncope shortness of breath; this involves an extensive number of treatment Options and is a complaint that carries with it a high risk of complications and morbidity. The differential includes dehydration, vasovagal syncope, arrhythmia, stroke, ACS, dehydration  I ordered, reviewed and interpreted labs, which included CBC with normal white count normal hemoglobin, chemistries with mild AKI, troponins elevated but flat I ordered medication IV fluids IV steroids IV magnesium and reviewed PMP when indicated. I ordered imaging studies which included CT head and neck, x-rays of extremities, CT angio head and neck, CT angio chest, MRI brain and I independently    visualized and interpreted imaging which showed old stroke and intracerebral atherosclerosis, no definite PE Additional history obtained from EMS and patient's companion's Previous records obtained and reviewed in epic including outpatient cardiology notes I consulted neurology Dr. Selina Cooley curbside.  And discussed lab and imaging findings and  discussed disposition.  Cardiac monitoring reviewed, normal sinus rhythm Social determinants considered, no significant barriers Critical Interventions: Multiple complex imaging to workup patient's symptoms.  After the interventions stated above, I reevaluated the patient and found patient to be fairly asymptomatic Admission and further testing considered, his care is signed out to Dr. Rubin Payor to follow-up on response to treatment.  If he is still requiring excessive oxygen or shortness of breath he potentially would benefit from mission for further management of his COPD.  Otherwise it sounds like his syncope and neurologic symptoms could be followed up outpatient.         Final Clinical Impression(s) / ED Diagnoses Final diagnoses:  Syncope, unspecified syncope type  Multiple abrasions    Rx / DC Orders ED Discharge Orders     None         Terrilee Files, MD 11/26/22 1755

## 2022-11-26 NOTE — Progress Notes (Signed)
   PCCM transfer request    Sending physician: [place]  Sending facility: [facility]  Reason for transfer: Pulmonary fibrosis  Brief case summary: To be seen as pulmonary consultation. Patient will remain on hospitalist service. 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 No PE on CT.   Recommendations made prior to transfer:  Ddx includes ILD flair and HFpEF. Agree with steroids. If BNP elevated, consider diuresis,   Transfer accepted: Will see in consultation on arrival at [facility]     Lynnell Catalan 11/26/22 7:23 PM [facility]  For contact information, see Amion. If no response to pager, please call PCCM consult pager. After hours, 7PM- 7AM, please call Elink.

## 2022-11-26 NOTE — H&P (Signed)
TRH H&P   Patient Demographics:    Maurice Munoz, is a 77 y.o. male  MRN: [id number]   DOB - July 11, 1945  Admit Date - 11/26/2022  Outpatient Primary MD for the patient is [name], MD  Referring MD/NP/PA: Dr Rubin Payor  Outpatient Specialists: Pulm Dr Colbert Coyer, Cards D[facility] Endo    Patient coming from: home   Chief Complaint  Patient presents with   Loss of Consciousness   Fall      HPI:    Maurice Munoz  is a 77 y.o. male, with past medical history of coronary artery disease, status post PCI, interstitial pulmonary fibrosis, with his right heart failure, hypertension, hyperlipidemia. -Patient presents today secondary to syncope x2, patient reports he lives at home by himself, reports he was outside getting ready to go to the doctor's appointment, he felt lightheaded, he sat down, where he had unwitnessed syncopal event, reports when he woke up he was laying on the as felt, where he had multiple durations and bruises, family found him on the ground, they tried to set him up, where he had another syncopal events, reports he was out for 15 minutes, per EMS he was initially hypotensive in the 70s, where he responded to fluid bolus, blood sugar was 131, he is on home oxygen at baseline, but he was noted to be significantly hypoxic on his home oxygen requirement. -In ED patient with profound hypoxia on home oxygen 3 L, currently requiring nonrebreather and salter together, MRI obtained as part of syncope workup, there was a concern for carotid artery stenosis, discussed with neurology who recommended CTA head and neck, it was significant for complete occlusion of left cervical ICA, and 55% stenosis of the proximal right cervical ICA (Dr. Darlyn Read reported given his total occlusion, workup is as outpatient), CTA chest  negative for PE, but with possible volume overload, [facility] hospitalist  consulted to admit    Review of systems:     A full 10 point Review of Systems was done, except as stated above, all other Review of Systems were negative.   With Past History of the following :    Past Medical History:  Diagnosis Date   Adrenal adenoma, left    small   Aortic atherosclerosis (HCC)    Back pain    Bilateral carotid artery stenosis followed by dr Tresa Endo   per last carotid duplex 07-17-2014  -- proximal LICA 50-69% ,  RICA 0-49%   CAD (coronary artery disease)    a. s/p prior intervention to RCA and PDA in 1999 b. DES to LAD and distal RCA in 2007   Cardiomegaly    Stable   Chronic constipation    Chronic pain syndrome    CKD (chronic kidney disease), stage III (HCC)    DDD (degenerative disc disease), cervical    DDD (degenerative disc disease), lumbosacral    Dyspnea  Dyspnea on exertion    Gout    per pt last inflared episode 2015 approx.   Grade II diastolic dysfunction    H/O epistaxis    per pt sees ENT dr Suszanne Conners for cauterization (pt takes plavix)   History of acute pancreatitis 08/08/2014   History of kidney stones    HTN (hypertension)    Hyperlipidemia    Interstitial lung disease (HCC)    Left arm pain    s/p fall   Nocturia    Numbness of right foot    due to DDD lumbar   Peripheral edema    Pilonidal cyst    Pleural effusion on right    Pulmonary fibrosis (HCC)    followed by pcp-- last chest CT 04-08-2016   S/P drug eluting coronary stent placement 10/11/2005   mLAD and dRCA   Wears dentures    upper   Wears glasses       Past Surgical History:  Procedure Laterality Date   ANTERIOR CERVICAL DECOMP/DISCECTOMY FUSION  12-25-2001     dr Terrilee Files [facility]   C3 -- C7   ANTERIOR CERVICAL DECOMP/DISCECTOMY FUSION  12-11-2012   dr Channing Mutters  [facility])   CARDIOVASCULAR STRESS TEST  08-23-2012    dr Tresa Endo   normal lexiscan nuclear study w/ no ischemia/  normal LV function and wall motion, ef 65%   CARDIOVASCULAR STRESS TEST      CORONARY ANGIOPLASTY  1999   PTCA to RCA & PDA   CORONARY ANGIOPLASTY WITH STENT PLACEMENT  10-11-2005  dr [name]   PCI and stenting to midLAD & dRCA (Cypher DES x2)   CORONARY BALLOON ANGIOPLASTY N/A 04/14/2018   Procedure: CORONARY BALLOON ANGIOPLASTY;  Surgeon: Lennette Bihari, MD;  Location: [facility] INVASIVE CV LAB;  Service: Cardiovascular;  Laterality: N/A;   CORONARY STENT INTERVENTION N/A 04/14/2018   Procedure: CORONARY STENT INTERVENTION;  Surgeon: Lennette Bihari, MD;  Location: [facility] INVASIVE CV LAB;  Service: Cardiovascular;  Laterality: N/A;   CYSTOSCOPY W/ URETERAL STENT PLACEMENT Left 02-11-2010     [facility]   LEFT HEART CATH N/A 04/14/2018   Procedure: Left Heart Cath;  Surgeon: Lennette Bihari, MD;  Location: [facility] INVASIVE CV LAB;  Service: Cardiovascular;  Laterality: N/A;   LEFT HEART CATH AND CORONARY ANGIOGRAPHY N/A 04/12/2018   Procedure: LEFT HEART CATH AND CORONARY ANGIOGRAPHY;  Surgeon: Corky Crafts, MD;  Location: [facility] INVASIVE CV LAB;  Service: Cardiovascular;  Laterality: N/A;   LUMBAR LAMINECTOMY  2015   L5 -- S1   PILONIDAL CYST EXCISION  1980s   PILONIDAL CYST EXCISION N/A 03/31/2017   Procedure: EXCISION OF PILONIDAL DISEASE with Flap Rotation;  Surgeon: Romie Levee, MD;  Location: [facility];  Service: General;  Laterality: N/A;   RIGHT/LEFT HEART CATH AND CORONARY ANGIOGRAPHY N/A 10/18/2022   Procedure: RIGHT/LEFT HEART CATH AND CORONARY ANGIOGRAPHY;  Surgeon: Lennette Bihari, MD;  Location: [facility] INVASIVE CV LAB;  Service: Cardiovascular;  Laterality: N/A;   THROAT SURGERY  1996   per pt "removal piece of cryloid(?) that was pressing against throat"  states no issues since   TRANSTHORACIC ECHOCARDIOGRAM  08-24-2010  dr Tresa Endo   ef 50-55%/  mild MR/  trivial TR   WOUND DEBRIDEMENT N/A 10/06/2017   Procedure: DEBRIDEMENT WOUND PLACEMENT OF WOUND HEALING MATRIX;  Surgeon: Romie Levee, MD;  Location: [facility] [place];  Service: General;   Laterality: N/A;      Social History:  Social History   Tobacco Use   Smoking status: Former    Packs/day: 3.00    Years: 25.00    Additional pack years: 0.00    Total pack years: 75.00    Types: Cigarettes    Quit date: 11/27/1988    Years since quitting: 34.0   Smokeless tobacco: Former    Quit date: 03/28/1989  Substance Use Topics   Alcohol use: No       Family History :     Family History  Problem Relation Age of Onset   Cancer Father    Heart attack Brother    Diabetes Brother    Colon cancer Neg Hx     Home Medications:   Prior to Admission medications   Medication Sig Start Date End Date Taking? Authorizing Provider  allopurinol (ZYLOPRIM) 300 MG tablet Take 300 mg by mouth in the morning. 11/06/12  Yes [provider]  amLODipine (NORVASC) 5 MG tablet Take 1.5 tablets (7.5 mg total) by mouth daily. Patient taking differently: Take 5 mg by mouth daily. 10/18/22  Yes Lennette Bihari, MD  aspirin EC 81 MG tablet Take 81 mg by mouth in the morning.   Yes [provider]  atorvastatin (LIPITOR) 80 MG tablet TAKE ONE TABLET (80MG  TOTAL) BY MOUTH AT6PM Patient taking differently: Take 80 mg by mouth daily. 02/17/22  Yes Lennette Bihari, MD  clopidogrel (PLAVIX) 75 MG tablet TAKE ONE (1) TABLET BY MOUTH EVERY DAY 03/22/22  Yes Lennette Bihari, MD  cyanocobalamin (VITAMIN B12) 1000 MCG tablet Take 1,000 mcg by mouth in the morning.   Yes [provider]  ESBRIET 801 MG TABS Take 1 tablet (801 mg total) by mouth 3 (three) times daily with meals. Patient taking differently: Take 1 tablet by mouth See admin instructions. Take 1 tablet (801 mg) by mouth up to 3 times daily with meals. 08/06/22  Yes Mannam, Praveen, MD  isosorbide mononitrate (IMDUR) 60 MG 24 hr tablet Take 60 mg  ( 1 tablet)  in the morning and  30 mg ( 1/2 tablet )  in the evening Patient taking differently: Take 30-60 mg by mouth See admin instructions. Take 60 mg  ( 1 tablet)  in  the morning and  30 mg ( 1/2 tablet )  in the evening 09/28/22  Yes Lennette Bihari, MD  lisinopril (ZESTRIL) 2.5 MG tablet TAKE ONE TABLET (2.5MG  TOTAL) BY MOUTH DAILY Patient taking differently: Take 2.5 mg by mouth daily. 05/31/22  Yes Lennette Bihari, MD  metoprolol tartrate (LOPRESSOR) 50 MG tablet Take 2 tablets (100 mg total) by mouth every morning AND 1.5 tablets (75 mg total) every evening. 10/18/22  Yes Lennette Bihari, MD  nitroGLYCERIN (NITROSTAT) 0.4 MG SL tablet Place 1 tablet (0.4 mg total) under the tongue every 5 (five) minutes as needed for chest pain. 08/03/17 10/14/23 Yes Lennette Bihari, MD  oxyCODONE-acetaminophen (PERCOCET) 10-325 MG per tablet Take 1 tablet by mouth every 6 (six) hours as needed for pain. 10/11/12  Yes [provider]  predniSONE (DELTASONE) 10 MG tablet Take 10-60 mg by mouth daily as needed (gout flares).   Yes [provider]  tiZANidine (ZANAFLEX) 4 MG tablet Take 4 mg by mouth at bedtime.  11/08/12  Yes [provider]  torsemide (DEMADEX) 20 MG tablet TAKE ONE TABLET (20MG  TOTAL) BY MOUTH DAILY. IF ADDITIONAL SWELLING, MAY TAKE AN ADDITIONAL DOSE IN THE AFTERNOON Patient taking differently: Take 20 mg by  mouth 2 (two) times a week. May take an additional dose the afternoon 02/16/21  Yes Lennette Bihari, MD  VITAMIN D PO Take 5,000 Units by mouth in the morning.   Yes [provider]  acetaminophen (TYLENOL) 500 MG tablet Take 1,000 mg by mouth every 6 (six) hours as needed (pain.).    [provider]  benzonatate (TESSALON) 200 MG capsule TAKE ONE CAPSULE (200MG  TOTAL) BY MOUTH TWO TIMES DAILY AS NEEDED FOR COUGH Patient not taking: Reported on 11/26/2022 07/23/20   Chilton Greathouse, MD  doxycycline (VIBRAMYCIN) 100 MG capsule Take 100 mg by mouth 2 (two) times daily. 11/25/22   [provider]  OXYGEN Inhale 4-5 L into the lungs as needed (respiratory issues/with activity).    [provider]      Allergies:     Allergies  Allergen Reactions   Ibuprofen Itching    Motrin brand   Neosporin [Neomycin-Bacitracin Zn-Polymyx] Swelling   Penicillins Rash     Has patient had a PCN reaction causing immediate rash, facial/tongue/throat swelling, SOB or lightheadedness with hypotension: No Has patient had a PCN reaction causing severe rash involving mucus membranes or skin necrosis: No Has patient had a PCN reaction that required hospitalization: No Has patient had a PCN reaction occurring within the last 10 years: No If all of the above answers are "NO", then may proceed with Cephalosporin use.      Physical Exam:   Vitals  Blood pressure 127/74, pulse 84, temperature 97.8 F (36.6 C), temperature source Oral, resp. rate 16, height 6\' 1"  (1.854 m), weight 83.9 kg, SpO2 91 %.   1. General frail elderly male, in discomfort secondary to dyspnea  2. Normal affect and insight, Not Suicidal or Homicidal, Awake Alert, Oriented X 3.  3. No F.N deficits, ALL C.Nerves Intact, Strength 5/5 all 4 extremities, Sensation intact all 4 extremities, Plantars down going.  4. Ears and Eyes appear Normal, Conjunctivae clear, PERRLA. Moist Oral Mucosa.  5. Supple Neck, No JVD, No cervical lymphadenopathy appriciated, No Carotid Bruits.  6. Symmetrical Chest wall movement, decreased air movement bilaterally, scattered rhonchi  7. RRR, No  Rubs or Murmurs.  Trace edema  8. Positive Bowel Sounds, Abdomen Soft, No tenderness, No organomegaly appriciated,No rebound -guarding or rigidity.  9.  No Cyanosis, Normal Skin Turgor, with multiple skin lacerations from fall right upper extremity, left hand, forehead and nasal bridge , as well bilateral knees, please see pictures below  10. Good muscle tone,  joints appear normal , no effusions, Normal ROM.  11. No Palpable Lymph Nodes in Neck or Axillae                Data Review:    CBC Recent Labs  Lab 11/26/22 0907  WBC 7.0  HGB 14.4   HCT 45.1  PLT 197  MCV 96.2  MCH 30.7  MCHC 31.9  RDW 14.8  LYMPHSABS 0.9  MONOABS 0.4  EOSABS 0.1  BASOSABS 0.0   ------------------------------------------------------------------------------------------------------------------  Chemistries  Recent Labs  Lab 11/26/22 0907  NA 138  K 4.3  CL 103  CO2 26  GLUCOSE 117*  BUN 30*  CREATININE 1.44*  CALCIUM 8.7*   ------------------------------------------------------------------------------------------------------------------ estimated creatinine clearance is 50.9 mL/min (A) (by C-G formula based on SCr of 1.44 mg/dL (H)). ------------------------------------------------------------------------------------------------------------------ No results for input(s): "TSH", "T4TOTAL", "T3FREE", "THYROIDAB" in the last 72 hours.  Invalid input(s): "FREET3"  Coagulation profile No results for input(s): "INR", "PROTIME" in the last 168 hours. -------------------------------------------------------------------------------------------------------------------  No results for input(s): "DDIMER" in the last 72 hours. -------------------------------------------------------------------------------------------------------------------  Cardiac Enzymes No results for input(s): "CKMB", "TROPONINI", "MYOGLOBIN" in the last 168 hours.  Invalid input(s): "CK" ------------------------------------------------------------------------------------------------------------------    Component Value Date/Time   BNP 195.0 (H) 04/11/2018 2113     ---------------------------------------------------------------------------------------------------------------  Urinalysis    Component Value Date/Time   COLORURINE YELLOW 02/26/2021 1757   APPEARANCEUR CLEAR 02/26/2021 1757   LABSPEC 1.020 02/26/2021 1757   PHURINE 6.0 02/26/2021 1757   GLUCOSEU NEGATIVE 02/26/2021 1757   HGBUR NEGATIVE 02/26/2021 1757   BILIRUBINUR NEGATIVE 02/26/2021 1757    KETONESUR NEGATIVE 02/26/2021 1757   PROTEINUR 30 (A) 02/26/2021 1757   UROBILINOGEN 0.2 03/08/2010 1403   NITRITE NEGATIVE 02/26/2021 1757   LEUKOCYTESUR NEGATIVE 02/26/2021 1757    ----------------------------------------------------------------------------------------------------------------   Imaging Results:    CT Angio Chest PE W/Cm &/Or Wo Cm  Result Date: 11/26/2022 CLINICAL DATA:  Pulmonary embolus suspected with high probability. Syncope and fall. Dizziness. EXAM: CT ANGIOGRAPHY CHEST WITH CONTRAST TECHNIQUE: Multidetector CT imaging of the chest was performed using the standard protocol during bolus administration of intravenous contrast. Multiplanar CT image reconstructions and MIPs were obtained to evaluate the vascular anatomy. RADIATION DOSE REDUCTION: This exam was performed according to the departmental dose-optimization program which includes automated exposure control, adjustment of the mA and/or kV according to patient size and/or use of iterative reconstruction technique. CONTRAST:  OMNIPAQUE IOHEXOL 350 MG/ML SOLN COMPARISON:  08/31/2022 FINDINGS: Cardiovascular: Technically adequate study with good opacification of the central and segmental pulmonary arteries. No focal filling defects. No evidence of significant pulmonary embolus. Cardiac enlargement with right heart predominance. No pericardial effusions. Normal caliber thoracic aorta. No aortic dissection. Calcification in the aorta and coronary arteries. Mediastinum/Nodes: Thyroid gland is unremarkable. Esophagus is decompressed. Mediastinal lymphadenopathy and right hilar lymphadenopathy. Largest left aortopulmonic window nodes measure 1.9 cm short axis dimension. Lymph nodes are enlarged since the prior study. Lungs/Pleura: Severe interstitial infiltrates throughout the lungs with a mostly peripheral and basilar distribution. Appearances are most consistent with usual interstitial pneumonitis. Severe honeycomb changes  particularly in the lung bases. These findings are similar to the prior study. However there is interval development of small bilateral pleural effusions as well as diffuse airspace and interstitial infiltration in the aerated portions of the lungs. Changes most likely represent developing pulmonary edema although multifocal pneumonia could also have this appearance. Bulla in the right apex. Upper Abdomen: Cholelithiasis with single stone in the gallbladder neck. Gallbladder wall appears mildly thickened, possibly indicating acute cholecystitis if in the appropriate clinical setting. Consider ultrasound for further evaluation if clinically indicated. Otherwise no acute changes demonstrated in the upper abdomen. Musculoskeletal: Degenerative changes in the spine. Postoperative changes in the cervical spine. Review of the MIP images confirms the above findings. IMPRESSION: 1. No evidence of significant pulmonary embolus. 2. Cardiac enlargement with right heart prominence suggesting right heart failure. 3. New finding of bilateral pleural effusions with airspace and interstitial disease throughout the lungs likely indicating pulmonary edema. Multifocal pneumonia would be a less likely possibility. 4. Chronic underlying interstitial lung disease with diffuse peripheral distribution of pulmonary fibrosis and severe honeycomb changes. 5. Mediastinal and right hilar lymphadenopathy is increasing since prior study. Etiology is nonspecific, possibly reactive, neoplastic, or lymphoma. 6. Cholelithiasis with suggestion of gallbladder wall thickening. Consider ultrasound if there is clinical concern for acute cholecystitis. 7. Aortic atherosclerosis. Electronically Signed   By: Burman Nieves M.D.   On: 11/26/2022 16:14   CT ANGIO HEAD  NECK W WO CM  Result Date: 11/26/2022 CLINICAL DATA:  Neuro deficit, acute, stroke suspected. EXAM: CT ANGIOGRAPHY HEAD AND NECK WITH AND WITHOUT CONTRAST TECHNIQUE: Multidetector CT imaging  of the head and neck was performed using the standard protocol during bolus administration of intravenous contrast. Multiplanar CT image reconstructions and MIPs were obtained to evaluate the vascular anatomy. Carotid stenosis measurements (when applicable) are obtained utilizing NASCET criteria, using the distal internal carotid diameter as the denominator. RADIATION DOSE REDUCTION: This exam was performed according to the departmental dose-optimization program which includes automated exposure control, adjustment of the mA and/or kV according to patient size and/or use of iterative reconstruction technique. CONTRAST:  OMNIPAQUE IOHEXOL 350 MG/ML SOLN COMPARISON:  MRI brain 11/26/2022. FINDINGS: CTA NECK FINDINGS Aortic arch: Three-vessel arch configuration. Atherosclerotic calcifications of the aortic arch and arch vessel origins. Arch vessel origins are patent. Right carotid system: Predominantly calcified plaque results in 55% stenosis of the proximal right cervical ICA. 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 Reconstitution at the communicating segment. Vertebral arteries: Codominant. No evidence of dissection, stenosis (50% or greater), or occlusion. Skeleton: Prior C3-C7 ACDF and C2-3 posterior spinal fusion. No suspicious bone lesions. Other neck: Unremarkable. Upper chest: Fibrosis and edema with apical bullae. Attention on same day CTA chest report. Review of the MIP images confirms the above findings CTA HEAD FINDINGS Anterior circulation: Reconstitution of the left ICA at the communicating segment. Right ICA is patent. The proximal ACAs and MCAs are patent without stenosis or aneurysm. Distal branches are symmetric. Posterior circulation: Normal basilar artery. The SCAs, AICAs and PICAs are patent proximally. The PCAs are patent proximally without stenosis or aneurysm. Distal branches are symmetric. Venous sinuses: Patent. Anatomic  variants: None. Review of the MIP images confirms the above findings IMPRESSION: 1. 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 2. 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 3. No intracranial large vessel occlusion or high-grade stenosis. Aortic Atherosclerosis (ICD10-I70.0). Electronically Signed   By: Orvan Falconer M.D.   On: 11/26/2022 16:10   MR BRAIN WO CONTRAST  Result Date: 11/26/2022 CLINICAL DATA:  Mental status change, unknown cause. EXAM: MRI HEAD WITHOUT CONTRAST TECHNIQUE: Multiplanar, multiecho pulse sequences of the brain and surrounding structures were obtained without intravenous contrast. COMPARISON:  Head CT 11/26/2022. FINDINGS: Brain: No acute infarct or hemorrhage. 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 No hydrocephalus or extra-axial collection. No mass or midline shift. No foci of abnormal susceptibility. Vascular: Loss of the left ICA flow void. Skull and upper cervical spine: Partially visualized susceptibility artifact from prior cervical spinal fusion. Otherwise normal marrow signal. Sinuses/Orbits: Unremarkable. Other: None. IMPRESSION: 1. No acute infarct or hemorrhage. Loss of the left ICA flow void. Recommend further characterization with CTA or contrast-enhanced MRA of the head and neck. 2. 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 Electronically Signed   By: Orvan Falconer M.D.   On: 11/26/2022 13:05   DG Hand Complete Left  Result Date: 11/26/2022 CLINICAL DATA:  Fall with left hand pain. EXAM: LEFT HAND - COMPLETE 3+ VIEW COMPARISON:  None Available. FINDINGS: Bone mineralization is normal. Alignment is normal. No acute fracture or dislocation. Punctate radiodensity over the soft tissues adjacent the fourth D IP joint which may represent a tiny foreign body. Mild degenerate changes over the radiocarpal joint, radial side of the carpal bones, interphalangeal joints as well  as the first  through third MCP joints. Old ulnar styloid fracture. IMPRESSION: 1. No acute findings. 2. Mild degenerative changes. 3. Possible tiny foreign body over the soft tissues adjacent the fourth DIP joint. Electronically Signed   By: Elberta Fortis M.D.   On: 11/26/2022 10:37   CT HEAD WO CONTRAST  Result Date: 11/26/2022 CLINICAL DATA:  Neck trauma EXAM: CT HEAD WITHOUT CONTRAST CT CERVICAL SPINE WITHOUT CONTRAST TECHNIQUE: Multidetector CT imaging of the head and cervical spine was performed following the standard protocol without intravenous contrast. Multiplanar CT image reconstructions of the cervical spine were also generated. RADIATION DOSE REDUCTION: This exam was performed according to the departmental dose-optimization program which includes automated exposure control, adjustment of the mA and/or kV according to patient size and/or use of iterative reconstruction technique. COMPARISON:  CT C Spine 02/24/10 FINDINGS: CT HEAD FINDINGS Brain: Subtle hypodensity in the anterior left frontal lobe (series 2, image 19) could represent an acute infarct or post-traumatic change. No evidence of hemorrhage, hydrocephalus, extra-axial collection or mass lesion/mass effect. Vascular: No hyperdense vessel or unexpected calcification. Skull: Soft tissue hematoma along the frontal scalp. No evidence calvarial fracture. Sinuses/Orbits: No acute finding. Other: None. CT CERVICAL SPINE FINDINGS Alignment: Straightening of the normal cervical lordosis. Skull base and vertebrae: Status post C3-C7 ACDF and C2-C3 posterior fusion. No acute fracture. No primary bone lesion or focal pathologic process. Soft tissues and spinal canal: No prevertebral fluid or swelling. No visible canal hematoma. Disc levels: Multilevel moderate spinal canal narrowing at C3-C7. Upper chest: Bullous change at the right lung apex. Other: None IMPRESSION: 1. Subtle cortical hypodensity in the anterior left frontal lobe could represent an acute infarct or  post-traumatic change. Brain MRI could be considered if this is a clinical concern. 2. Soft tissue hematoma along the frontal scalp. No evidence of calvarial fracture. 3. No acute cervical spine fracture. Electronically Signed   By: Lorenza Cambridge M.D.   On: 11/26/2022 10:29   CT CERVICAL SPINE WO CONTRAST  Result Date: 11/26/2022 CLINICAL DATA:  Neck trauma EXAM: CT HEAD WITHOUT CONTRAST CT CERVICAL SPINE WITHOUT CONTRAST TECHNIQUE: Multidetector CT imaging of the head and cervical spine was performed following the standard protocol without intravenous contrast. Multiplanar CT image reconstructions of the cervical spine were also generated. RADIATION DOSE REDUCTION: This exam was performed according to the departmental dose-optimization program which includes automated exposure control, adjustment of the mA and/or kV according to patient size and/or use of iterative reconstruction technique. COMPARISON:  CT C Spine 02/24/10 FINDINGS: CT HEAD FINDINGS Brain: Subtle hypodensity in the anterior left frontal lobe (series 2, image 19) could represent an acute infarct or post-traumatic change. No evidence of hemorrhage, hydrocephalus, extra-axial collection or mass lesion/mass effect. Vascular: No hyperdense vessel or unexpected calcification. Skull: Soft tissue hematoma along the frontal scalp. No evidence calvarial fracture. Sinuses/Orbits: No acute finding. Other: None. CT CERVICAL SPINE FINDINGS Alignment: Straightening of the normal cervical lordosis. Skull base and vertebrae: Status post C3-C7 ACDF and C2-C3 posterior fusion. No acute fracture. No primary bone lesion or focal pathologic process. Soft tissues and spinal canal: No prevertebral fluid or swelling. No visible canal hematoma. Disc levels: Multilevel moderate spinal canal narrowing at C3-C7. Upper chest: Bullous change at the right lung apex. Other: None IMPRESSION: 1. Subtle cortical hypodensity in the anterior left frontal lobe could represent an acute  infarct or post-traumatic change. Brain MRI could be considered if this is a clinical concern. 2. Soft tissue hematoma along the frontal  scalp. No evidence of calvarial fracture. 3. No acute cervical spine fracture. Electronically Signed   By: Lorenza Cambridge M.D.   On: 11/26/2022 10:29   DG Elbow Complete Right  Result Date: 11/26/2022 CLINICAL DATA:  Fall with right elbow pain. EXAM: RIGHT ELBOW - COMPLETE 3+ VIEW COMPARISON:  None Available. 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 No significant joint effusion. No evidence of acute fracture or dislocation. IMPRESSION: 1. No acute findings. 2. 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 Electronically Signed   By: Elberta Fortis M.D.   On: 11/26/2022 10:19   DG Hand Complete Right  Result Date: 11/26/2022 CLINICAL DATA:  Fall with left hand pain. EXAM: RIGHT HAND - COMPLETE 3+ VIEW COMPARISON:  None Available. FINDINGS: Alignment and bone mineralization is normal. There are mild degenerative changes over the radiocarpal joint, carpal bones and interphalangeal joints as well as first carpometacarpal joint. No bony erosions. No acute fracture or dislocation. Punctate radiopaque focus over the soft tissues adjacent the volar/ulnar aspect of the base of the second middle phalanx possibly a small foreign body. IMPRESSION: 1. No acute findings. 2. Mild degenerative changes. 3. Possible punctate foreign body over the soft tissues adjacent the base of the second middle phalanx. Electronically Signed   By: Elberta Fortis M.D.   On: 11/26/2022 10:17    EKG:  Vent. rate 90 BPM PR interval 184 ms QRS duration 127 ms QT/QTcB 392/483 ms P-R-T axes -84 89 -53 Sinus or ectopic atrial rhythm Nonspecific intraventricular conduction delay Nonspecific repol abnormality, diffuse leads Minimal ST elevation, lateral leads Artifact in lead(s) I II aVR aVL aVF V1 new T wave inversions from prior 10/19   Assessment & Plan:    Principal  Problem:   Acute on chronic respiratory failure with hypoxia (HCC) Active Problems:   Obesity (BMI 30-39.9)   Pancreatitis-Idiopathy acute 2.18.16 admitted [facility]   Hyperlipidemia LDL goal <70   CAD in native artery   CKD (chronic kidney disease) stage 3, GFR 30-59 ml/min (HCC)   IPF (idiopathic pulmonary fibrosis) (HCC)   Essential hypertension   Acute on chronic hypoxic respiratory failure IPF exacerbation Right heart failure unspecified Pulmonary hypertension -Patient with known history of fibrosis, and right heart failure, worsening hypoxia, he is currently requiring both 15 L salter and nonrebreather. -Most likely in the setting of IPF exacerbation, will start on IV Solu-Medrol, keep as needed albuterol, will keep empirically on IV Rocephin and azithromycin till infectious etiology is ruled out. -Very difficult to assess volume status currently, he has some trace edema, recent right heart cath with no significant evidence of volume overload, especially with soft blood pressure currently I will hold on IV diuresis, will follow-up with BNP as discussed with pulmonary consult, and if significantly elevated will proceed with IV Lasix if blood pressure allows. -Patient will be admitted to [facility] for evaluation by pulmonary service, and possible need for cardiology input as well if it was felt right heart failure is contributing to his worsening hypoxia. -I will continue Esbriet for now, but will defer final recommendation up to pulmonary MAC the patient  Syncope -Syncope most likely in the setting of significant hypoxia due to above, he will be monitored on telemetry -First repeat 2D echo to further evaluate right heart   CAD Carotid artery stenosis -Continue with aspirin, Plavix and statin  -  CTA head and neck, it was significant for complete occlusion of left cervical ICA, and 55% stenosis of the proximal right cervical ICA (Dr.  Stacks reported given his total occlusion,  workup is as outpatient),  CKD stage III -Renal function at baseline, continue to monitor closely, as he may need IV diuresis pending further evaluation  Essential hypertension -Blood pressure has been soft currently, will hold home medications, especially may need diuresis  Hyperlipidemia -Continue with home dose statin  DVT Prophylaxis Heparin   AM Labs Ordered, also please review Full Orders  Family Communication: Admission, patients condition and plan of care including tests being ordered have been discussed with the patient and brother and granddaughter who indicate understanding and agree with the plan and Code Status.  Code Status full  Likely DC to  home  Condition GUARDED    Consults called: Pulm at [facility], palliative consult requested in epic to address goals of care, this has discussed with granddaughter  Admission status: inpatient    Time spent in minutes : 75 minutes   Huey Bienenstock M.D on 11/26/2022 at 7:45 PM   [facility] - Office  [phone]

## 2022-11-26 NOTE — ED Notes (Signed)
Patient transported to CT 

## 2022-11-26 NOTE — ED Triage Notes (Signed)
Pt is on plavix. [facility] upon arrival bp was 78/57. They gave pt of NS with the 18 G in LAC. Bp increased to 127/99 . CBG 131. Pt has his glasses and cell phone holder but family has wallet and phone.

## 2022-11-27 ENCOUNTER — Other Ambulatory Visit (HOSPITAL_COMMUNITY): Payer: Medicare Other

## 2022-11-27 ENCOUNTER — Inpatient Hospital Stay (HOSPITAL_COMMUNITY): Payer: Medicare Other

## 2022-11-27 ENCOUNTER — Encounter (HOSPITAL_COMMUNITY): Payer: Self-pay | Admitting: Internal Medicine

## 2022-11-27 DIAGNOSIS — Z7189 Other specified counseling: Secondary | ICD-10-CM

## 2022-11-27 DIAGNOSIS — Z515 Encounter for palliative care: Secondary | ICD-10-CM | POA: Diagnosis not present

## 2022-11-27 DIAGNOSIS — J9621 Acute and chronic respiratory failure with hypoxia: Secondary | ICD-10-CM | POA: Diagnosis not present

## 2022-11-27 LAB — RESPIRATORY PANEL BY PCR

## 2022-11-27 LAB — CBC
HCT: 49.1 % (ref 39.0–52.0)
Hemoglobin: 15.7 g/dL (ref 13.0–17.0)
MCH: 29.9 pg (ref 26.0–34.0)
MCHC: 32 g/dL (ref 30.0–36.0)
MCV: 93.5 fL (ref 80.0–100.0)
Platelets: 188 10*3/uL (ref 150–400)
RBC: 5.25 MIL/uL (ref 4.22–5.81)
RDW: 14.7 % (ref 11.5–15.5)
WBC: 17.7 10*3/uL — ABNORMAL HIGH (ref 4.0–10.5)
nRBC: 0 % (ref 0.0–0.2)

## 2022-11-27 LAB — PROCALCITONIN: Procalcitonin: <0.1 ng/mL

## 2022-11-27 LAB — BASIC METABOLIC PANEL WITH GFR
Anion gap: 11 (ref 5–15)
BUN: 32 mg/dL — ABNORMAL HIGH (ref 8–23)
CO2: 26 mmol/L (ref 22–32)
Calcium: 9.1 mg/dL (ref 8.9–10.3)
Chloride: 100 mmol/L (ref 98–111)
Creatinine, Ser: 1.69 mg/dL — ABNORMAL HIGH (ref 0.61–1.24)
GFR, Estimated: 42 mL/min — ABNORMAL LOW
Glucose, Bld: 123 mg/dL — ABNORMAL HIGH (ref 70–99)
Potassium: 5 mmol/L (ref 3.5–5.1)
Sodium: 137 mmol/L (ref 135–145)

## 2022-11-27 LAB — BRAIN NATRIURETIC PEPTIDE: B Natriuretic Peptide: 2446 pg/mL — ABNORMAL HIGH (ref 0.0–100.0)

## 2022-11-27 LAB — MAGNESIUM: Magnesium: 2.1 mg/dL (ref 1.7–2.4)

## 2022-11-27 LAB — C-REACTIVE PROTEIN: CRP: 2.2 mg/dL — ABNORMAL HIGH (ref <1.0)

## 2022-11-27 MED ORDER — ISOSORBIDE MONONITRATE ER 30 MG PO TB24: 30.0000 mg | ORAL_TABLET | Freq: Every evening | ORAL | Status: DC

## 2022-11-27 MED ORDER — ACETAMINOPHEN 325 MG PO TABS
650.0000 mg | ORAL_TABLET | Freq: Four times a day (QID) | ORAL | Status: DC | PRN
  Administered 2022-11-27 – 2022-11-29 (×4): 650 mg via ORAL
  Filled 2022-11-27 (×4): qty 2

## 2022-11-27 MED ORDER — ASPIRIN 81 MG PO TBEC
81.0000 mg | DELAYED_RELEASE_TABLET | Freq: Every morning | ORAL | Status: DC
  Administered 2022-11-27 – 2022-11-30 (×4): 81 mg via ORAL
  Filled 2022-11-27 (×4): qty 1

## 2022-11-27 MED ORDER — ATORVASTATIN CALCIUM 80 MG PO TABS
80.0000 mg | ORAL_TABLET | Freq: Every day | ORAL | Status: DC
  Administered 2022-11-27 – 2022-11-30 (×4): 80 mg via ORAL
  Filled 2022-11-27 (×4): qty 1

## 2022-11-27 MED ORDER — BENZONATATE 100 MG PO CAPS
100.0000 mg | ORAL_CAPSULE | ORAL | Status: DC | PRN
  Administered 2022-11-28 – 2022-11-29 (×2): 100 mg via ORAL
  Filled 2022-11-27 (×2): qty 1

## 2022-11-27 MED ORDER — HYDRALAZINE HCL 20 MG/ML IJ SOLN: 5.0000 mg | INTRAMUSCULAR | Status: DC | PRN

## 2022-11-27 MED ORDER — PIRFENIDONE 801 MG PO TABS
1.0000 | ORAL_TABLET | Freq: Once | ORAL | Status: AC
  Administered 2022-11-27: 801 mg via ORAL

## 2022-11-27 MED ORDER — TIZANIDINE HCL 4 MG PO TABS
4.0000 mg | ORAL_TABLET | Freq: Every day | ORAL | Status: DC
  Administered 2022-11-27 – 2022-11-29 (×3): 4 mg via ORAL
  Filled 2022-11-27 (×3): qty 1

## 2022-11-27 MED ORDER — PIRFENIDONE 801 MG PO TABS
1.0000 | ORAL_TABLET | Freq: Three times a day (TID) | ORAL | Status: DC
  Administered 2022-11-27 – 2022-11-30 (×9): 801 mg via ORAL
  Filled 2022-11-27 (×13): qty 1

## 2022-11-27 MED ORDER — CLOPIDOGREL BISULFATE 75 MG PO TABS
75.0000 mg | ORAL_TABLET | Freq: Every day | ORAL | Status: DC
  Administered 2022-11-27 – 2022-11-30 (×4): 75 mg via ORAL
  Filled 2022-11-27 (×4): qty 1

## 2022-11-27 MED ORDER — HEPARIN SODIUM (PORCINE) 5000 UNIT/ML IJ SOLN
5000.0000 [IU] | Freq: Three times a day (TID) | INTRAMUSCULAR | Status: DC
  Administered 2022-11-27 – 2022-11-30 (×10): 5000 [IU] via SUBCUTANEOUS
  Filled 2022-11-27 (×10): qty 1

## 2022-11-27 MED ORDER — ALLOPURINOL 300 MG PO TABS
300.0000 mg | ORAL_TABLET | Freq: Every morning | ORAL | Status: DC
  Administered 2022-11-27 – 2022-11-30 (×4): 300 mg via ORAL
  Filled 2022-11-27 (×4): qty 1

## 2022-11-27 MED ORDER — VITAMIN B-12 1000 MCG PO TABS
1000.0000 ug | ORAL_TABLET | Freq: Every morning | ORAL | Status: DC
  Administered 2022-11-27 – 2022-11-30 (×4): 1000 ug via ORAL
  Filled 2022-11-27 (×4): qty 1

## 2022-11-27 MED ORDER — NITROGLYCERIN 0.4 MG SL SUBL: 0.4000 mg | SUBLINGUAL_TABLET | SUBLINGUAL | Status: DC | PRN

## 2022-11-27 MED ORDER — LACTATED RINGERS IV BOLUS: 250.0000 mL | Freq: Once | INTRAVENOUS | Status: DC

## 2022-11-27 MED ORDER — ISOSORBIDE MONONITRATE ER 30 MG PO TB24
60.0000 mg | ORAL_TABLET | Freq: Every morning | ORAL | Status: DC
  Administered 2022-11-27: 60 mg via ORAL
  Filled 2022-11-27: qty 2

## 2022-11-27 MED ORDER — FUROSEMIDE 10 MG/ML IJ SOLN
40.0000 mg | Freq: Once | INTRAMUSCULAR | Status: AC
  Administered 2022-11-27: 40 mg via INTRAVENOUS
  Filled 2022-11-27: qty 4

## 2022-11-27 MED ORDER — ALBUTEROL SULFATE (2.5 MG/3ML) 0.083% IN NEBU: 2.5000 mg | INHALATION_SOLUTION | RESPIRATORY_TRACT | Status: DC | PRN

## 2022-11-27 MED ORDER — DILTIAZEM HCL 25 MG/5ML IV SOLN
10.0000 mg | Freq: Four times a day (QID) | INTRAVENOUS | Status: DC | PRN
  Administered 2022-11-27: 10 mg via INTRAVENOUS
  Filled 2022-11-27 (×2): qty 5

## 2022-11-27 MED ORDER — VITAMIN D 25 MCG (1000 UNIT) PO TABS
5000.0000 [IU] | ORAL_TABLET | Freq: Every morning | ORAL | Status: DC
  Administered 2022-11-27 – 2022-11-30 (×4): 5000 [IU] via ORAL
  Filled 2022-11-27 (×4): qty 5

## 2022-11-27 MED ORDER — METOPROLOL TARTRATE 25 MG PO TABS
25.0000 mg | ORAL_TABLET | Freq: Two times a day (BID) | ORAL | Status: DC
  Administered 2022-11-27 – 2022-11-30 (×7): 25 mg via ORAL
  Filled 2022-11-27 (×7): qty 1

## 2022-11-27 NOTE — Progress Notes (Signed)
RT instructed patient on the use of a flutter valve. Patient able to demonstrate back good technique followed by a non-productive cough.

## 2022-11-27 NOTE — Evaluation (Signed)
Physical Therapy Evaluation Patient Details Name: Maurice Munoz MRN: [id number] DOB: [birth year] Today's Date: 11/27/2022  History of Present Illness  77 y/o M admitted to [facility] on 6/7 after a fall and loss of consciousness. CTA showed complete occlusion of left cervical ICA, CTA chest negative for PE but with possible volume overload. X-rays of R elbow and B hands negative for acute injury. PMHx: CAD s/p PCI, interstitial pulmonary fibrosis, right heart failure, HTN, HLD.  Clinical Impression  Pt presents today with impaired functional mobility, limited by activity tolerance, balance, strength, and pain in B knees. Pt reports being ambulatory around his home and utilizing an electric scooter in the community, however pt requiring min-maxA for bed mobility and transfers today, unable to ambulate due to fatigue with standing and soreness in B knees after his fall. Pt's SPO2 stable during the session, however noted increased RR with mobility, cues provided for pursed lip breathing, pt using acapella device during session. Pt will continue to benefit from skilled acute PT at this time to progress mobility, balance, and endurance, recommend high intensity PT upon discharge to progress pt back to his baseline and maximize independence with mobility. Acute PT will continue to follow as appropriate.      Recommendations for follow up therapy are one component of a multi-disciplinary discharge planning process, led by the attending physician.  Recommendations may be updated based on patient status, additional functional criteria and insurance authorization.  Follow Up Recommendations       Assistance Recommended at Discharge Frequent or constant Supervision/Assistance  Patient can return home with the following  Two people to help with walking and/or transfers;Assistance with cooking/housework;Assist for transportation;Help with stairs or ramp for entrance    Equipment Recommendations None recommended by  PT  Recommendations for Other Services  Rehab consult    Functional Status Assessment Patient has had a recent decline in their functional status and demonstrates the ability to make significant improvements in function in a reasonable and predictable amount of time.     Precautions / Restrictions Precautions Precautions: Fall;Other (comment) Precaution Comments: watch O2 and BP Restrictions Weight Bearing Restrictions: No      Mobility  Bed Mobility Overal bed mobility: Needs Assistance Bed Mobility: Supine to Sit     Supine to sit: Min assist, HOB elevated     General bed mobility comments: minA for trunk support and pivoting hips to EOB, use of bed rail    Transfers Overall transfer level: Needs assistance Equipment used: 1 person hand held assist Transfers: Sit to/from Stand, Bed to chair/wheelchair/BSC Sit to Stand: Max assist, From elevated surface Stand pivot transfers: Max assist, From elevated surface         General transfer comment: maxA to stand from elevated bed, attempted BP in standing but machine not taking x2 attempts, seated rest break before second stand and pivot to chair, maxA required for power up and steady with cueing for sequencing of pivot to chair. B knee flexion during standing, cueing for full knee extension but pt reporting pain    Ambulation/Gait               General Gait Details: not attempted today  Stairs            Wheelchair Mobility    Modified Rankin (Stroke Patients Only)       Balance Overall balance assessment: Needs assistance Sitting-balance support: Bilateral upper extremity supported, Feet supported Sitting balance-Leahy Scale: Fair     Standing balance  support: Bilateral upper extremity supported, During functional activity Standing balance-Leahy Scale: Poor Standing balance comment: reliant on UE support with static standing and pivoting                             Pertinent  Vitals/Pain Pain Assessment Pain Assessment: Faces Faces Pain Scale: Hurts little more Pain Location: B knees Pain Descriptors / Indicators: Sore Pain Intervention(s): Limited activity within patient's tolerance, Monitored during session, Repositioned    Home Living Family/patient expects to be discharged to:: Private residence Living Arrangements: Alone Available Help at Discharge: Family;Friend(s);Available PRN/intermittently Type of Home: House Home Access: Stairs to enter Entrance Stairs-Rails: None Entrance Stairs-Number of Steps: 1 threshold   Home Layout: One level Home Equipment: Insurance risk surveyor (2 wheels);Cane - single point;Shower seat - built in;Grab bars - tub/shower;Hand held shower head      Prior Function Prior Level of Function : Independent/Modified Independent;History of Falls (last six months);Driving             Mobility Comments: RW in the house, has electric scooter he uses for community distances, 1-2 falls over the past 6 months besides the ones causing admission ADLs Comments: independent     Hand Dominance        Extremity/Trunk Assessment   Upper Extremity Assessment Upper Extremity Assessment: Defer to OT evaluation    Lower Extremity Assessment Lower Extremity Assessment: Generalized weakness (BLE sore, limited resistance applied, reports mild tingling/numbness in B feet that has been ongoing)    Cervical / Trunk Assessment Cervical / Trunk Assessment: Normal  Communication   Communication: No difficulties  Cognition Arousal/Alertness: Awake/alert Behavior During Therapy: WFL for tasks assessed/performed Overall Cognitive Status: Within Functional Limits for tasks assessed                                 General Comments: A&Ox4, pleasant throughout session, family and friends present throughout session and pt conversing with everyone appropriately, retelling what he remembers from his LOC         General Comments General comments (skin integrity, edema, etc.): BP in sitting 99/60, unable to take in standing but once transferred to chair BP 99/69. SPO2 90-93% on 13L O2 HFNC, friends and family present throughout session    Exercises     Assessment/Plan    PT Assessment Patient needs continued PT services  PT Problem List Decreased strength;Decreased activity tolerance;Decreased balance;Decreased mobility;Decreased knowledge of use of DME;Decreased safety awareness;Decreased knowledge of precautions;Cardiopulmonary status limiting activity;Impaired sensation;Pain       PT Treatment Interventions DME instruction;Gait training;Stair training;Functional mobility training;Therapeutic activities;Therapeutic exercise;Balance training;Neuromuscular re-education;Patient/family education    PT Goals (Current goals can be found in the Care Plan section)  Acute Rehab PT Goals Patient Stated Goal: get oxygen levels better and go home PT Goal Formulation: With patient Time For Goal Achievement: 12/11/22 Potential to Achieve Goals: Fair    Frequency Min 4X/week     Co-evaluation               AM-PAC PT "6 Clicks" Mobility  Outcome Measure Help needed turning from your back to your side while in a flat bed without using bedrails?: A Little Help needed moving from lying on your back to sitting on the side of a flat bed without using bedrails?: A Little Help needed moving to and from a bed to a chair (including a  wheelchair)?: A Lot Help needed standing up from a chair using your arms (e.g., wheelchair or bedside chair)?: A Lot Help needed to walk in hospital room?: Total Help needed climbing 3-5 steps with a railing? : Total 6 Click Score: 12    End of Session Equipment Utilized During Treatment: Gait belt;Oxygen Activity Tolerance: Patient limited by fatigue;Patient limited by pain Patient left: in chair;with call bell/phone within reach;with chair alarm set;with family/visitor  present Nurse Communication: Mobility status PT Visit Diagnosis: Unsteadiness on feet (R26.81);History of falling (Z91.81);Muscle weakness (generalized) (M62.81);Other abnormalities of gait and mobility (R26.89);Difficulty in walking, not elsewhere classified (R26.2)    Time: 9562-1308 PT Time Calculation (min) (ACUTE ONLY): 29 min   Charges:   PT Evaluation $PT Eval Moderate Complexity: 1 Mod          Lindalou Hose, PT DPT Acute Rehabilitation Services Office [phone]   Leonie Man 11/27/2022, 3:35 PM

## 2022-11-27 NOTE — Progress Notes (Signed)
RT called to room because patient's O2 saturation was 90% on 15 L HFNC.  Patient O2 saturation was 95% while at the bedside.  RT advised RN that the O2 saturation goal >90 %.  Patient did not have increased WOB at this time.  RT will continue to monitor.

## 2022-11-27 NOTE — Consult Note (Signed)
Palliative Medicine Inpatient Consult Note  Consulting Provider:  Starleen Arms, MD   Reason for consult:   Palliative Care Consult Services Palliative Medicine Consult  Reason for Consult? goals of care   11/27/2022  HPI:  Per intake H&P -->  Maurice Munoz  is a 77 y.o. male, with past medical history of coronary artery disease, status post PCI, interstitial pulmonary fibrosis, with his right heart failure, hypertension, hyperlipidemia. Transferred from [facility] in the setting of increasing O2 needs. The Palliative care team has been asked to get involved for further goals of care conversations.   Clinical Assessment/Goals of Care:  *Please note that this is a verbal dictation therefore any spelling or grammatical errors are due to the "Dragon Medical One" system interpretation.  I have reviewed medical records including EPIC notes, labs and imaging, received report from bedside RN, assessed the patient who was sitting at the side of his bed eating breakfast joined by his stepson and best friend.    I met with Qadir this morning to further discuss diagnosis prognosis, GOC, EOL wishes, disposition and options.   I introduced Palliative Medicine as specialized medical care for people living with serious illness. It focuses on providing relief from the symptoms and stress of a serious illness. The goal is to improve quality of life for both the patient and the family.  Medical History Review and Understanding:  A review of Maurice Munoz's PMH inclusive of coronary artery disease, interstitial pulmonary fibrosis, heart failure, hypertension, and degenerative disc disease.  Social History:  Lamontae shares with me that he is from [facility] originally.  He shares that he has lived in [place] throughout the duration of his life.  He is a widower.  He does not have any children though has several stepchildren from prior marriage.  He shares that he has 6 brothers and 5  sisters some of whom are deceased.  He enjoys fishing and was just doing this within the last week.  He is a man of faith and practices within the [employer] denomination.  Functional and Nutritional State:  Preceding hospitalization, Maurice Munoz was living in a single-family home.  He was able to perform basic activities of daily.  His granddaughter [name] would come on occasion to assist him as well as spend time with him.  Aldred does feel that his appetite was fairly decent preceding his admission.  Advance Directives:  A detailed discussion was had today regarding advanced directives.  Maurice Munoz shares that his granddaughter [name] is his Runner, broadcasting/film/video and he has previously done paperwork to indicate this.  Code Status:  Concepts specific to code status, artifical feeding and hydration, continued IV antibiotics and rehospitalization was had.  The difference between a aggressive medical intervention path  and a palliative comfort care path for this patient at this time was had.   Encouraged patient/family to consider DNR/DNI status understanding evidenced based poor outcomes in similar hospitalized patient, as the cause of arrest is likely associated with advanced chronic/terminal illness rather than an easily reversible acute cardio-pulmonary event. I explained that DNR/DNI does not change the medical plan and it only comes into effect after a person has arrested (died).  It is a protective measure to keep [place] from harming the patient in their last moments of life. [name] was agreeable to DNR/DNI with understanding that patient would not receive CPR, defibrillation, ACLS medications, or intubation.   Discussion:  I was able to discuss with Maurice Munoz his underlying interstitial lung  disease.  He is aware that this is advanced and at home was receiving 5 L of oxygen.  He does share with me that he was able to function at home though did feel dyspneic when he overexerted himself.  He  expresses the knowledge that his disease process is progressive and incurable.  We reviewed at this time that his goals are to continue present measures to hopefully improve to the point whereby he can go fishing again.  Maurice Munoz and I did discuss best case and worst-case scenarios-best case that he continues to improve over time we are able to identify the underlying reason why he is decompensated.  Worst-case scenarios are those whereby he continues to worsen and we are left with little options in terms of treatment.  I shared if that would be the case we would further review the ideals associated with hospice care.  [name] is familiar with hospice and does recognize he is not going to live forever.  It appears that he vocalizes acceptance in terms of his health either improving or declining.  Discussed the importance of continued conversation with family and their  medical providers regarding overall plan of care and treatment options, ensuring decisions are within the context of the patients values and GOCs. _________________________________________ Addendum:  I received a message from patients RN in regards to his code status. She shares that he no longer desires to be a DNAR/DNI.  I met at bedside with Maurice Munoz, we reviewed the reality of a resuscitation event. We discussed his likely outcomes. He shares he doesn't want to "give up". I tried to re-frame this thought for he and his family as in all honesty we would be preserving his body from additional pain and possibly suffering if such an event occurred. He requests more time to consider this in the oncoming days. _________________________________________ Addendum #2:  I was able to speak to Maurice Munoz's granddaughter, Maurice Munoz. We had an open and honest conversation in the setting of patients multiple chronic co-mobidities. She is aware of the conversations regarding rescucitation status and will help support patients decisions regarding this. We  also discussed hospice care should he continue to decline.  We plan to meet on Monday at 4PM for further conversations.  Ongoing PMT support.  Decision MakerPrentiss Bells Granddaughter [phone]  [phone]   SUMMARY OF RECOMMENDATIONS   Full Code --> Patient having a hard time with this decision and feels a DNAR/DNI is "giving up" will provide ongoing education  Allow time for outcomes  Goals at this time are for improvement though Wendelin is aware of the possibility he could acutely decline  Discussed the idea of hospice care if Avontay does not improve  Ongoing PMT support  Code Status/Advance Care Planning: DNAR/DNI   Palliative Prophylaxis:  Aspiration, Bowel Regimen, Delirium Protocol, Frequent Pain Assessment, Oral Care, Palliative Wound Care, and Turn Reposition  Additional Recommendations (Limitations, Scope, Preferences): Continue present measures to improve health state  Psycho-social/Spiritual:  Desire for further Chaplaincy support: Patient is [place] Additional Recommendations: Education on progressive nature of IPF   Prognosis: Increased mortality risk d/t progression of IPF  Discharge Planning: Discharge plan is not certain at this time  Vitals:   11/27/22 0322 11/27/22 0420  BP: (!) 143/92 139/78  Pulse: (!) 110 95  Resp: (!) 23 (!) 24  Temp: 98.2 F (36.8 C) 98.1 F (36.7 C)  SpO2: 100% 91%    Intake/Output Summary (Last 24 hours) at 11/27/2022 0655 Last data filed at 11/27/2022 0035 Gross  per 24 hour  Intake 400 ml  Output --  Net 400 ml   Last Weight  Most recent update: 11/26/2022  9:00 AM    Weight  83.9 kg (185 lb)            Gen: Elderly Caucasian M in NAD HEENT: moist mucous membranes CV: Irregular rate and regular rhythm  PULM:  On 13LPM of HFNC, breathing is slightly rapid ABD: soft/nontender  EXT: No edema  Neuro: Alert and oriented x3   PPS:    This conversation/these recommendations were discussed with patient  primary care team, Dr. Thedore Mins  Total Time: 58 Billing based on MDM: High  Problems Addressed: One acute or chronic illness or injury that poses a threat to life or bodily function  Amount and/or Complexity of Data: Category 3:Discussion of management or test interpretation with external physician/other qualified health care professional/appropriate source (not separately reported)  Risks: Decision not to resuscitate or to de-escalate care because of poor prognosis ______________________________________________________ Lamarr Lulas Wallace Palliative Medicine Team Team Cell Phone: [phone] Please utilize secure chat with additional questions, if there is no response within 30 minutes please call the above phone number  Palliative Medicine Team providers are available by phone from 7am to 7pm daily and can be reached through the team cell phone.  Should this patient require assistance outside of these hours, please call the patient's attending physician.

## 2022-11-27 NOTE — Consult Note (Signed)
NAME:  Maurice Munoz, MRN:  [id number], DOB:  1945-09-20, LOS: 1 ADMISSION DATE:  11/26/2022, CONSULTATION DATE:  11/27/22 REFERRING MD:  TRH, CHIEF COMPLAINT:  DOE   History of Present Illness:  77 year old with signs of ILD dating back to at least 2016 presents with recurrent syncope and worsening dyspnea on exertion.  Notes passing out several times over the last few days.  With worsening dyspnea on exertion.  He can stop more frequently.  Does not check his oxygen very much at home.  Sounds like is not wearing oxygen as prescribed or recommended.  Workup in the emergency room revealed complete right ICA occlusion.  Also, CTA PE protocol was obtained that showed no PE but did demonstrate on my review interpretation severe IPF and honeycombing changes overall progressive over time, interlobular septal thickening, diffuse central groundglass opacities and bilateral pleural effusions concerning for pulmonary edema and cardiogenic volume overload.  He was transferred to [facility] for further evaluation.  Dyspnea been bad for the last 3 months or so.  Gradually worsening.  Followed by cardiology, Dr. Tresa Endo.  He states is not taking his Lasix as prescribed.  He recently had a left heart catheterization (history of PCI x 5) demonstrated nonobstructive CAD but did demonstrate pulmonary hypertension with PVR 6.89, LVEDP of 15.  Extensive pulmonary notes reviewed.  Extensive cardiology notes reviewed.  Extensive images reviewed.  Pertinent  Medical History  ILD/IPF, chronic hypoxemic respiratory failure 4 to 6 L at home baseline  Significant Hospital Events: Including procedures, antibiotic start and stop dates in addition to other pertinent events   6/7 repeat episodes of what sounds like syncope at home and worsening shortness of breath so presented to the ED  Interim History / Subjective:    Objective   Blood pressure 108/82, pulse (!) 111, temperature 99.5 F (37.5 C), temperature source  Oral, resp. rate 20, height 6\' 1"  (1.854 m), weight 83.9 kg, SpO2 92 %.    FiO2 (%):  [15 %-100 %] 15 %   Intake/Output Summary (Last 24 hours) at 11/27/2022 1209 Last data filed at 11/27/2022 1000 Gross per 24 hour  Intake 400 ml  Output 350 ml  Net 50 ml   Filed Weights   11/26/22 0859  Weight: 83.9 kg    Examination: General: Frail elderly gentleman lying in bed HENT: Multiple abrasions noted on his face and head Neck: Supple, no JVP appreciated Lungs: Diminished in the bases, normal work of breathing on 15 L nasal cannula Cardiovascular: Warm, trace edema Abdomen: Nondistended, bowel sounds present MSK no synovitis, no joint effusion Neuro: No focal deficits, sensation appears intact Psych: Normal mood, full affect  Resolved Hospital Problem list     Assessment & Plan:  Acute on chronic hypoxemic respiratory failure: With CT images most consistent with volume overload.  He reports nonadherence to home diuretic therapy.  Diastolic dysfunction in the past on TTE.  BNP is very elevated.  He has underlying IPF which is his initial contributor to hypoxemia.  CRP mildly elevated.  However, do not feel like IPF or ILD exacerbation is primary driver of symptoms.  Procalcitonin negative.  He denies infectious symptoms.  Infection felt unlikely. Goal O2 sat 88%, wean as able -- Recommend aggressive diuresis, if not responding to initial dose of Lasix I would recommend doubling -- Goal net negative at least 1 L daily -- Continue diuresis as blood pressure and kidney function allows -- Solu-Medrol 125 mg daily x 3 days, prednisone taper  thereafter -- Agree with repeat TTE -- Will need to establish new baseline oxygen requirement at rest and with exertion as he at admits to not using oxygen as recommended   Acute on chronic diastolic congestive heart failure: With acute exacerbation. -- Lasix as above -- Repeat TTE as above  Pulmonary hypertension: Likely multifactorial Group 1 disease  given elevated PVR, group 2 disease given diastolic function, large contribution group 3 disease given significant parenchymal disease and hypoxemia related to IPF. -- Discussed can see for therapies as an outpatient  IPF: Progressive over time.  First sign of ILD in 2016 on my review of serial CT scans. -- If not progressing in the coming days, encouraged to resume pirfenidone, will need to bring home supply can readdress during the week  Best Practice (right click and "Reselect all SmartList Selections" daily)   Per primary  Labs   CBC: Recent Labs  Lab 11/26/22 0907 11/27/22 0608  WBC 7.0 17.7*  NEUTROABS 5.5  --   HGB 14.4 15.7  HCT 45.1 49.1  MCV 96.2 93.5  PLT 197 188    Basic Metabolic Panel: Recent Labs  Lab 11/26/22 0907 11/27/22 0608  NA 138 137  K 4.3 5.0  CL 103 100  CO2 26 26  GLUCOSE 117* 123*  BUN 30* 32*  CREATININE 1.44* 1.69*  CALCIUM 8.7* 9.1  MG  --  2.1   GFR: Estimated Creatinine Clearance: 43.3 mL/min (A) (by C-G formula based on SCr of 1.69 mg/dL (H)). Recent Labs  Lab 11/26/22 0907 11/27/22 0608  PROCALCITON  --  <0.10  WBC 7.0 17.7*    Liver Function Tests: No results for input(s): "AST", "ALT", "ALKPHOS", "BILITOT", "PROT", "ALBUMIN" in the last 168 hours. No results for input(s): "LIPASE", "AMYLASE" in the last 168 hours. No results for input(s): "AMMONIA" in the last 168 hours.  ABG    Component Value Date/Time   PHART 7.347 (L) 10/18/2022 0827   PCO2ART 49.2 (H) 10/18/2022 0827   PO2ART 128 (H) 10/18/2022 0827   HCO3 27.0 10/18/2022 0827   TCO2 28 10/18/2022 0827   O2SAT 99 10/18/2022 0827     Coagulation Profile: No results for input(s): "INR", "PROTIME" in the last 168 hours.  Cardiac Enzymes: No results for input(s): "CKTOTAL", "CKMB", "CKMBINDEX", "TROPONINI" in the last 168 hours.  HbA1C: No results found for: "HGBA1C"  CBG: Recent Labs  Lab 11/26/22 0922  GLUCAP 96    Review of Systems:   No chest  pain.  No orthopnea or PND.  Comprehensive review of systems otherwise negative.  Past Medical History:  He,  has a past medical history of Adrenal adenoma, left, Aortic atherosclerosis (HCC), Back pain, Bilateral carotid artery stenosis (followed by dr Tresa Endo), CAD (coronary artery disease), Cardiomegaly, Chronic constipation, Chronic pain syndrome, CKD (chronic kidney disease), stage III (HCC), DDD (degenerative disc disease), cervical, DDD (degenerative disc disease), lumbosacral, Dyspnea, Dyspnea on exertion, Gout, Grade II diastolic dysfunction, H/O epistaxis, History of acute pancreatitis (08/08/2014), History of kidney stones, HTN (hypertension), Hyperlipidemia, Interstitial lung disease (HCC), Left arm pain, Nocturia, Numbness of right foot, Peripheral edema, Pilonidal cyst, Pleural effusion on right, Pulmonary fibrosis (HCC), S/P drug eluting coronary stent placement (10/11/2005), Wears dentures, and Wears glasses.   Surgical History:   Past Surgical History:  Procedure Laterality Date   ANTERIOR CERVICAL DECOMP/DISCECTOMY FUSION  12-25-2001     dr Terrilee Files [facility]   C3 -- C7   ANTERIOR CERVICAL DECOMP/DISCECTOMY FUSION  12-11-2012   dr  roy  [facility])   CARDIOVASCULAR STRESS TEST  08-23-2012    dr Tresa Endo   normal lexiscan nuclear study w/ no ischemia/  normal LV function and wall motion, ef 65%   CARDIOVASCULAR STRESS TEST     CORONARY ANGIOPLASTY  1999   PTCA to RCA & PDA   CORONARY ANGIOPLASTY WITH STENT PLACEMENT  10-11-2005  dr [name]   PCI and stenting to midLAD & dRCA (Cypher DES x2)   CORONARY BALLOON ANGIOPLASTY N/A 04/14/2018   Procedure: CORONARY BALLOON ANGIOPLASTY;  Surgeon: Lennette Bihari, MD;  Location: [facility] INVASIVE CV LAB;  Service: Cardiovascular;  Laterality: N/A;   CORONARY STENT INTERVENTION N/A 04/14/2018   Procedure: CORONARY STENT INTERVENTION;  Surgeon: Lennette Bihari, MD;  Location: [facility] INVASIVE CV LAB;  Service: Cardiovascular;  Laterality: N/A;    CYSTOSCOPY W/ URETERAL STENT PLACEMENT Left 02-11-2010     [facility]   LEFT HEART CATH N/A 04/14/2018   Procedure: Left Heart Cath;  Surgeon: Lennette Bihari, MD;  Location: [facility] INVASIVE CV LAB;  Service: Cardiovascular;  Laterality: N/A;   LEFT HEART CATH AND CORONARY ANGIOGRAPHY N/A 04/12/2018   Procedure: LEFT HEART CATH AND CORONARY ANGIOGRAPHY;  Surgeon: Corky Crafts, MD;  Location: [facility] INVASIVE CV LAB;  Service: Cardiovascular;  Laterality: N/A;   LUMBAR LAMINECTOMY  2015   L5 -- S1   PILONIDAL CYST EXCISION  1980s   PILONIDAL CYST EXCISION N/A 03/31/2017   Procedure: EXCISION OF PILONIDAL DISEASE with Flap Rotation;  Surgeon: Romie Levee, MD;  Location: [facility];  Service: General;  Laterality: N/A;   RIGHT/LEFT HEART CATH AND CORONARY ANGIOGRAPHY N/A 10/18/2022   Procedure: RIGHT/LEFT HEART CATH AND CORONARY ANGIOGRAPHY;  Surgeon: Lennette Bihari, MD;  Location: [facility] INVASIVE CV LAB;  Service: Cardiovascular;  Laterality: N/A;   THROAT SURGERY  1996   per pt "removal piece of cryloid(?) that was pressing against throat"  states no issues since   TRANSTHORACIC ECHOCARDIOGRAM  08-24-2010  dr Tresa Endo   ef 50-55%/  mild MR/  trivial TR   WOUND DEBRIDEMENT N/A 10/06/2017   Procedure: DEBRIDEMENT WOUND PLACEMENT OF WOUND HEALING MATRIX;  Surgeon: Romie Levee, MD;  Location: [facility];  Service: General;  Laterality: N/A;     Social History:   reports that he quit smoking about 34 years ago. His smoking use included cigarettes. He has a 75.00 pack-year smoking history. He quit smokeless tobacco use about 33 years ago. He reports that he does not drink alcohol and does not use drugs.   Family History:  His family history includes Cancer in his father; Diabetes in his brother; Heart attack in his brother. There is no history of Colon cancer.   Allergies Allergies  Allergen Reactions   Ibuprofen Itching    Motrin brand   Neosporin [Neomycin-Bacitracin  Zn-Polymyx] Swelling   Penicillins Rash     Has patient had a PCN reaction causing immediate rash, facial/tongue/throat swelling, SOB or lightheadedness with hypotension: No Has patient had a PCN reaction causing severe rash involving mucus membranes or skin necrosis: No Has patient had a PCN reaction that required hospitalization: No Has patient had a PCN reaction occurring within the last 10 years: No If all of the above answers are "NO", then may proceed with Cephalosporin use.      Home Medications  Prior to Admission medications   Medication Sig Start Date End Date Taking? Authorizing Provider  allopurinol (ZYLOPRIM) 300 MG tablet Take  300 mg by mouth in the morning. 11/06/12  Yes [provider]  amLODipine (NORVASC) 5 MG tablet Take 1.5 tablets (7.5 mg total) by mouth daily. Patient taking differently: Take 5 mg by mouth daily. 10/18/22  Yes Lennette Bihari, MD  aspirin EC 81 MG tablet Take 81 mg by mouth in the morning.   Yes [provider]  atorvastatin (LIPITOR) 80 MG tablet TAKE ONE TABLET (80MG  TOTAL) BY MOUTH AT6PM Patient taking differently: Take 80 mg by mouth daily. 02/17/22  Yes Lennette Bihari, MD  clopidogrel (PLAVIX) 75 MG tablet TAKE ONE (1) TABLET BY MOUTH EVERY DAY 03/22/22  Yes Lennette Bihari, MD  cyanocobalamin (VITAMIN B12) 1000 MCG tablet Take 1,000 mcg by mouth in the morning.   Yes [provider]  ESBRIET 801 MG TABS Take 1 tablet (801 mg total) by mouth 3 (three) times daily with meals. Patient taking differently: Take 1 tablet by mouth See admin instructions. Take 1 tablet (801 mg) by mouth up to 3 times daily with meals. 08/06/22  Yes Mannam, Praveen, MD  isosorbide mononitrate (IMDUR) 60 MG 24 hr tablet Take 60 mg  ( 1 tablet)  in the morning and  30 mg ( 1/2 tablet )  in the evening Patient taking differently: Take 30-60 mg by mouth See admin instructions. Take 60 mg  ( 1 tablet)  in the morning and  30 mg ( 1/2 tablet )  in the  evening 09/28/22  Yes Lennette Bihari, MD  lisinopril (ZESTRIL) 2.5 MG tablet TAKE ONE TABLET (2.5MG  TOTAL) BY MOUTH DAILY Patient taking differently: Take 2.5 mg by mouth daily. 05/31/22  Yes Lennette Bihari, MD  metoprolol tartrate (LOPRESSOR) 50 MG tablet Take 2 tablets (100 mg total) by mouth every morning AND 1.5 tablets (75 mg total) every evening. 10/18/22  Yes Lennette Bihari, MD  nitroGLYCERIN (NITROSTAT) 0.4 MG SL tablet Place 1 tablet (0.4 mg total) under the tongue every 5 (five) minutes as needed for chest pain. 08/03/17 10/14/23 Yes Lennette Bihari, MD  oxyCODONE-acetaminophen (PERCOCET) 10-325 MG per tablet Take 1 tablet by mouth every 6 (six) hours as needed for pain. 10/11/12  Yes [provider]  predniSONE (DELTASONE) 10 MG tablet Take 10-60 mg by mouth daily as needed (gout flares).   Yes [provider]  tiZANidine (ZANAFLEX) 4 MG tablet Take 4 mg by mouth at bedtime.  11/08/12  Yes [provider]  torsemide (DEMADEX) 20 MG tablet TAKE ONE TABLET (20MG  TOTAL) BY MOUTH DAILY. IF ADDITIONAL SWELLING, MAY TAKE AN ADDITIONAL DOSE IN THE AFTERNOON Patient taking differently: Take 20 mg by mouth 2 (two) times a week. May take an additional dose the afternoon 02/16/21  Yes Lennette Bihari, MD  VITAMIN D PO Take 5,000 Units by mouth in the morning.   Yes [provider]  acetaminophen (TYLENOL) 500 MG tablet Take 1,000 mg by mouth every 6 (six) hours as needed (pain.).    [provider]  benzonatate (TESSALON) 200 MG capsule TAKE ONE CAPSULE (200MG  TOTAL) BY MOUTH TWO TIMES DAILY AS NEEDED FOR COUGH Patient not taking: Reported on 11/26/2022 07/23/20   Chilton Greathouse, MD  doxycycline (VIBRAMYCIN) 100 MG capsule Take 100 mg by mouth 2 (two) times daily. 11/25/22   [provider]  OXYGEN Inhale 4-5 L into the lungs as needed (respiratory issues/with activity).    [provider]     Critical care time: n/a    I  spent 80 minutes in  the care of the patient including review of records, face-to-face visit, coordination of care.  Karren Burly, MD

## 2022-11-27 NOTE — Progress Notes (Signed)
PROGRESS NOTE                                                                                                                                                                                                             Patient Demographics:    Maurice Munoz, is a 77 y.o. male, DOB - 1945/12/01, YTK:[id number]  Outpatient Primary MD for the patient is [name], MD    LOS - 1  Admit date - 11/26/2022    Chief Complaint  Patient presents with   Loss of Consciousness   Fall       Brief Narrative (HPI from H&P)   77 y.o. male, with past medical history of coronary artery disease, status post PCI, interstitial pulmonary fibrosis, with his right heart failure, hypertension, hyperlipidemia.  -Patient presents today secondary to syncope x2, patient reports he lives at home by himself, reports he was outside getting ready to go to the doctor's appointment, he felt lightheaded, he sat down, where he had unwitnessed syncopal event, reports when he woke up he was laying on the as felt, where he had multiple durations and bruises, family found him on the ground, they tried to set him up, where he had another syncopal events, reports he was out for 15 minutes, per EMS he was initially hypotensive in the 70s, where he responded to fluid bolus, blood sugar was 131, he is on home oxygen at baseline, but he was noted to be significantly hypoxic on his home oxygen requirement.  In ED patient with profound hypoxia on home oxygen 3 L, currently requiring nonrebreather and salter together, MRI obtained as part of syncope workup, there was a concern for carotid artery stenosis, discussed with neurology who recommended CTA head and neck, it was significant for complete occlusion of left cervical ICA, and 55% stenosis of the proximal right cervical ICA (Dr. Darlyn Read reported given his total occlusion, workup is as outpatient), CTA chest  negative for PE,  but with possible volume overload, [facility] hospitalist consulted to admit at [facility], ER, he was subsequently transferred to [facility] for further care.   Subjective:    [name] today has, No headache, No chest pain, No abdominal pain - No Nausea, No new weakness tingling or numbness, +ve cough and SOB   Assessment  & Plan :    Acute  on chronic hypoxic respiratory failure in a patient with advanced underlying IPF on 5 L nasal cannula oxygen with history of pulmonary hypertension and known right-sided heart failure.   Pulmonary is following the patient, he has been placed on IV steroids along with supportive care with nebulizer treatments and additional oxygen, he uses 5 L at baseline, case discussed with pulmonary on 11/27/2022 they think patient has volume overload and want IV Lasix which will be added and continued.  Added I-S and flutter valve for pulmonary toiletry, will also check a respiratory viral panel.  Continue to monitor.  Syncope.  Likely due to hypoxia.  MRI brain nonacute.  PT OT, monitor orthostatics.  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  Per neurology Case was discussed upon admission outpatient follow-up.  Continue aspirin and statin for secondary prevention also on Plavix.  Avoid hypotension as cerebral perfusion can be compromised easily.  Dyslipidemia.  On statin.  CKDstage IIIb.  Mild AKI.  Currently on diuretics monitor cautiously.  Avoid nephrotoxins.  CAD.  No acute issues.  On DAPT and statin for secondary prevention.  Monitor.  Hypertension.  Blood pressure currently soft.  Blood pressure medications on hold.       Condition - Extremely Guarded  Family Communication  :  None  Code Status :  Full  Consults  :  PCCM  PUD Prophylaxis :  PPI   Procedures  :     TTE -   MRI - 1. No acute infarct or hemorrhage. Loss of the left ICA flow void. Recommend further characterization with CTA or contrast-enhanced MRA of the head and neck.  2. 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 2. 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 3. No intracranial large vessel occlusion or high-grade stenosis. Aortic Atherosclerosis   CTA Chest - 1. No evidence of significant pulmonary embolus. 2. Cardiac enlargement with right heart prominence suggesting right heart failure. 3. New finding of bilateral pleural effusions with airspace and interstitial disease throughout the lungs likely indicating pulmonary edema. Multifocal pneumonia would be a less likely possibility. 4. Chronic underlying interstitial lung disease with diffuse peripheral distribution of pulmonary fibrosis and severe honeycomb changes. 5. Mediastinal and right hilar lymphadenopathy is increasing since prior study. Etiology is nonspecific, possibly reactive, neoplastic, or lymphoma. 6. Cholelithiasis with suggestion of gallbladder wall thickening. Consider ultrasound if there is clinical concern for acute cholecystitis. 7. Aortic atherosclerosis      Disposition Plan  :    Status is: Inpatient  DVT Prophylaxis  :    heparin injection 5,000 Units Start: 11/27/22 1400    Lab Results  Component Value Date   PLT 188 11/27/2022    Diet :  Diet Order             Diet Heart Room service appropriate? Yes; Fluid consistency: Thin  Diet effective now                    Inpatient Medications  Scheduled Meds:  allopurinol  300 mg Oral q AM   aspirin EC  81 mg Oral q AM   atorvastatin  80 mg Oral Daily   cholecalciferol  5,000 Units Oral q AM   clopidogrel  75 mg Oral Daily   cyanocobalamin  1,000 mcg Oral q AM   furosemide  40 mg Intravenous  Once   heparin  5,000 Units Subcutaneous Q8H   isosorbide mononitrate  30 mg Oral QPM   isosorbide mononitrate  60 mg Oral q morning   methylPREDNISolone (SOLU-MEDROL) injection  125 mg  Intravenous Daily   metoprolol tartrate  25 mg Oral BID   pantoprazole  40 mg Oral Daily   Pirfenidone  1 tablet Oral TID WC   tiZANidine  4 mg Oral QHS   Continuous Infusions:  azithromycin Stopped (11/27/22 0035)   cefTRIAXone (ROCEPHIN)  IV Stopped (11/26/22 2048)   PRN Meds:.acetaminophen, albuterol, benzonatate, hydrALAZINE, nitroGLYCERIN  Antibiotics  :    Anti-infectives (From admission, onward)    Start     Dose/Rate Route Frequency Ordered Stop   11/27/22 1600  azithromycin (ZITHROMAX) 500 mg in sodium chloride 0.9 % 250 mL IVPB  Status:  Discontinued        500 mg 250 mL/hr over 60 Minutes Intravenous Every 24 hours 11/26/22 1942 11/26/22 2007   11/26/22 2007  azithromycin (ZITHROMAX) 500 mg in sodium chloride 0.9 % 250 mL IVPB        500 mg 250 mL/hr over 60 Minutes Intravenous Every 24 hours 11/26/22 2007     11/26/22 2006  cefTRIAXone (ROCEPHIN) 2 g in sodium chloride 0.9 % 100 mL IVPB        2 g 200 mL/hr over 30 Minutes Intravenous Every 24 hours 11/26/22 1942           Objective:   Vitals:   11/27/22 0322 11/27/22 0420 11/27/22 0800 11/27/22 0919  BP: (!) 143/92 139/78 128/80   Pulse: (!) 110 95 (!) 110   Resp: (!) 23 (!) 24 16   Temp: 98.2 F (36.8 C) 98.1 F (36.7 C) 97.8 F (36.6 C)   TempSrc: Oral Oral Oral   SpO2: 100% 91% 92% 90%  Weight:      Height:        Wt Readings from Last 3 Encounters:  11/26/22 83.9 kg  10/18/22 83 kg  09/28/22 83 kg     Intake/Output Summary (Last 24 hours) at 11/27/2022 0927 Last data filed at 11/27/2022 0035 Gross per 24 hour  Intake 400 ml  Output --  Net 400 ml     Physical Exam  Awake Alert, No new F.N deficits, Normal affect [place].AT,PERRAL Supple Neck, No JVD,   Symmetrical Chest wall movement, Good air movement bilaterally, CTAB RRR,No Gallops,Rubs or new Murmurs,  +ve B.Sounds, Abd Soft, No tenderness,   No Cyanosis, Clubbing or edema       Data Review:    Recent Labs  Lab 11/26/22 0907  11/27/22 0608  WBC 7.0 17.7*  HGB 14.4 15.7  HCT 45.1 49.1  PLT 197 188  MCV 96.2 93.5  MCH 30.7 29.9  MCHC 31.9 32.0  RDW 14.8 14.7  LYMPHSABS 0.9  --   MONOABS 0.4  --   EOSABS 0.1  --   BASOSABS 0.0  --     Recent Labs  Lab 11/26/22 0907 11/27/22 0430 11/27/22 0608  NA 138  --  137  K 4.3  --  5.0  CL 103  --  100  CO2 26  --  26  ANIONGAP 9  --  11  GLUCOSE 117*  --  123*  BUN 30*  --  32*  CREATININE 1.44*  --  1.69*  CRP  --   --  2.2*  PROCALCITON  --   --  <0.10  BNP 975.0* 2,446.0*  --  MG  --   --  2.1  CALCIUM 8.7*  --  9.1      Recent Labs  Lab 11/26/22 0907 11/27/22 0430 11/27/22 0608  CRP  --   --  2.2*  PROCALCITON  --   --  <0.10  BNP 975.0* 2,446.0*  --   MG  --   --  2.1  CALCIUM 8.7*  --  9.1      Radiology Reports DG Chest Port 1 View  Result Date: 11/27/2022 CLINICAL DATA:  Shortness of breath. EXAM: PORTABLE CHEST 1 VIEW COMPARISON:  CT 11/26/2022 FINDINGS: Stable cardiomediastinal contours. Unchanged small bilateral pleural effusions. Severe changes of chronic interstitial lung disease is again seen throughout both lungs with diffuse interstitial reticulation, architectural distortion and scarring. Diffuse ground-glass attenuation and patchy airspace opacification are unchanged from the previous CT. IMPRESSION: 1. No change in aeration to the lungs compared with previous CT. 2. Severe changes of chronic interstitial lung disease. Electronically Signed   By: Signa Kell M.D.   On: 11/27/2022 07:58   CT Angio Chest PE W/Cm &/Or Wo Cm  Result Date: 11/26/2022 CLINICAL DATA:  Pulmonary embolus suspected with high probability. Syncope and fall. Dizziness. EXAM: CT ANGIOGRAPHY CHEST WITH CONTRAST TECHNIQUE: Multidetector CT imaging of the chest was performed using the standard protocol during bolus administration of intravenous contrast. Multiplanar CT image reconstructions and MIPs were obtained to evaluate the vascular anatomy. RADIATION  DOSE REDUCTION: This exam was performed according to the departmental dose-optimization program which includes automated exposure control, adjustment of the mA and/or kV according to patient size and/or use of iterative reconstruction technique. CONTRAST:  OMNIPAQUE IOHEXOL 350 MG/ML SOLN COMPARISON:  08/31/2022 FINDINGS: Cardiovascular: Technically adequate study with good opacification of the central and segmental pulmonary arteries. No focal filling defects. No evidence of significant pulmonary embolus. Cardiac enlargement with right heart predominance. No pericardial effusions. Normal caliber thoracic aorta. No aortic dissection. Calcification in the aorta and coronary arteries. Mediastinum/Nodes: Thyroid gland is unremarkable. Esophagus is decompressed. Mediastinal lymphadenopathy and right hilar lymphadenopathy. Largest left aortopulmonic window nodes measure 1.9 cm short axis dimension. Lymph nodes are enlarged since the prior study. Lungs/Pleura: Severe interstitial infiltrates throughout the lungs with a mostly peripheral and basilar distribution. Appearances are most consistent with usual interstitial pneumonitis. Severe honeycomb changes particularly in the lung bases. These findings are similar to the prior study. However there is interval development of small bilateral pleural effusions as well as diffuse airspace and interstitial infiltration in the aerated portions of the lungs. Changes most likely represent developing pulmonary edema although multifocal pneumonia could also have this appearance. Bulla in the right apex. Upper Abdomen: Cholelithiasis with single stone in the gallbladder neck. Gallbladder wall appears mildly thickened, possibly indicating acute cholecystitis if in the appropriate clinical setting. Consider ultrasound for further evaluation if clinically indicated. Otherwise no acute changes demonstrated in the upper abdomen. Musculoskeletal: Degenerative changes in the spine.  Postoperative changes in the cervical spine. Review of the MIP images confirms the above findings. IMPRESSION: 1. No evidence of significant pulmonary embolus. 2. Cardiac enlargement with right heart prominence suggesting right heart failure. 3. New finding of bilateral pleural effusions with airspace and interstitial disease throughout the lungs likely indicating pulmonary edema. Multifocal pneumonia would be a less likely possibility. 4. Chronic underlying interstitial lung disease with diffuse peripheral distribution of pulmonary fibrosis and severe honeycomb changes. 5. Mediastinal and right hilar lymphadenopathy is increasing since prior study. Etiology is nonspecific, possibly reactive, neoplastic, or  lymphoma. 6. Cholelithiasis with suggestion of gallbladder wall thickening. Consider ultrasound if there is clinical concern for acute cholecystitis. 7. Aortic atherosclerosis. Electronically Signed   By: Burman Nieves M.D.   On: 11/26/2022 16:14   CT ANGIO HEAD NECK W WO CM  Result Date: 11/26/2022 CLINICAL DATA:  Neuro deficit, acute, stroke suspected. EXAM: CT ANGIOGRAPHY HEAD AND NECK WITH AND WITHOUT CONTRAST TECHNIQUE: Multidetector CT imaging of the head and neck was performed using the standard protocol during bolus administration of intravenous contrast. Multiplanar CT image reconstructions and MIPs were obtained to evaluate the vascular anatomy. Carotid stenosis measurements (when applicable) are obtained utilizing NASCET criteria, using the distal internal carotid diameter as the denominator. RADIATION DOSE REDUCTION: This exam was performed according to the departmental dose-optimization program which includes automated exposure control, adjustment of the mA and/or kV according to patient size and/or use of iterative reconstruction technique. CONTRAST:  OMNIPAQUE IOHEXOL 350 MG/ML SOLN COMPARISON:  MRI brain 11/26/2022. FINDINGS: CTA NECK FINDINGS Aortic arch: Three-vessel arch  configuration. Atherosclerotic calcifications of the aortic arch and arch vessel origins. Arch vessel origins are patent. Right carotid system: Predominantly calcified plaque results in 55% stenosis of the proximal right cervical ICA. 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 Reconstitution at the communicating segment. Vertebral arteries: Codominant. No evidence of dissection, stenosis (50% or greater), or occlusion. Skeleton: Prior C3-C7 ACDF and C2-3 posterior spinal fusion. No suspicious bone lesions. Other neck: Unremarkable. Upper chest: Fibrosis and edema with apical bullae. Attention on same day CTA chest report. Review of the MIP images confirms the above findings CTA HEAD FINDINGS Anterior circulation: Reconstitution of the left ICA at the communicating segment. Right ICA is patent. The proximal ACAs and MCAs are patent without stenosis or aneurysm. Distal branches are symmetric. Posterior circulation: Normal basilar artery. The SCAs, AICAs and PICAs are patent proximally. The PCAs are patent proximally without stenosis or aneurysm. Distal branches are symmetric. Venous sinuses: Patent. Anatomic variants: None. Review of the MIP images confirms the above findings IMPRESSION: 1. 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 2. 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 3. No intracranial large vessel occlusion or high-grade stenosis. Aortic Atherosclerosis (ICD10-I70.0). Electronically Signed   By: Orvan Falconer M.D.   On: 11/26/2022 16:10   MR BRAIN WO CONTRAST  Result Date: 11/26/2022 CLINICAL DATA:  Mental status change, unknown cause. EXAM: MRI HEAD WITHOUT CONTRAST TECHNIQUE: Multiplanar, multiecho pulse sequences of the brain and surrounding structures were obtained without intravenous contrast. COMPARISON:  Head CT 11/26/2022. FINDINGS: Brain: No acute infarct or  hemorrhage. 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 No hydrocephalus or extra-axial collection. No mass or midline shift. No foci of abnormal susceptibility. Vascular: Loss of the left ICA flow void. Skull and upper cervical spine: Partially visualized susceptibility artifact from prior cervical spinal fusion. Otherwise normal marrow signal. Sinuses/Orbits: Unremarkable. Other: None. IMPRESSION: 1. No acute infarct or hemorrhage. Loss of the left ICA flow void. Recommend further characterization with CTA or contrast-enhanced MRA of the head and neck. 2. 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 Electronically Signed   By: Orvan Falconer M.D.   On: 11/26/2022 13:05   DG Hand Complete Left  Result Date: 11/26/2022 CLINICAL DATA:  Fall with left hand pain. EXAM: LEFT HAND - COMPLETE 3+ VIEW COMPARISON:  None Available. FINDINGS: Bone mineralization is normal. Alignment is normal.  No acute fracture or dislocation. Punctate radiodensity over the soft tissues adjacent the fourth D IP joint which may represent a tiny foreign body. Mild degenerate changes over the radiocarpal joint, radial side of the carpal bones, interphalangeal joints as well as the first through third MCP joints. Old ulnar styloid fracture. IMPRESSION: 1. No acute findings. 2. Mild degenerative changes. 3. Possible tiny foreign body over the soft tissues adjacent the fourth DIP joint. Electronically Signed   By: Elberta Fortis M.D.   On: 11/26/2022 10:37   CT HEAD WO CONTRAST  Result Date: 11/26/2022 CLINICAL DATA:  Neck trauma EXAM: CT HEAD WITHOUT CONTRAST CT CERVICAL SPINE WITHOUT CONTRAST TECHNIQUE: Multidetector CT imaging of the head and cervical spine was performed following the standard protocol without intravenous contrast. Multiplanar CT image reconstructions of the cervical spine were also generated. RADIATION DOSE REDUCTION: This exam was performed according to the departmental dose-optimization program which includes  automated exposure control, adjustment of the mA and/or kV according to patient size and/or use of iterative reconstruction technique. COMPARISON:  CT C Spine 02/24/10 FINDINGS: CT HEAD FINDINGS Brain: Subtle hypodensity in the anterior left frontal lobe (series 2, image 19) could represent an acute infarct or post-traumatic change. No evidence of hemorrhage, hydrocephalus, extra-axial collection or mass lesion/mass effect. Vascular: No hyperdense vessel or unexpected calcification. Skull: Soft tissue hematoma along the frontal scalp. No evidence calvarial fracture. Sinuses/Orbits: No acute finding. Other: None. CT CERVICAL SPINE FINDINGS Alignment: Straightening of the normal cervical lordosis. Skull base and vertebrae: Status post C3-C7 ACDF and C2-C3 posterior fusion. No acute fracture. No primary bone lesion or focal pathologic process. Soft tissues and spinal canal: No prevertebral fluid or swelling. No visible canal hematoma. Disc levels: Multilevel moderate spinal canal narrowing at C3-C7. Upper chest: Bullous change at the right lung apex. Other: None IMPRESSION: 1. Subtle cortical hypodensity in the anterior left frontal lobe could represent an acute infarct or post-traumatic change. Brain MRI could be considered if this is a clinical concern. 2. Soft tissue hematoma along the frontal scalp. No evidence of calvarial fracture. 3. No acute cervical spine fracture. Electronically Signed   By: Lorenza Cambridge M.D.   On: 11/26/2022 10:29   CT CERVICAL SPINE WO CONTRAST  Result Date: 11/26/2022 CLINICAL DATA:  Neck trauma EXAM: CT HEAD WITHOUT CONTRAST CT CERVICAL SPINE WITHOUT CONTRAST TECHNIQUE: Multidetector CT imaging of the head and cervical spine was performed following the standard protocol without intravenous contrast. Multiplanar CT image reconstructions of the cervical spine were also generated. RADIATION DOSE REDUCTION: This exam was performed according to the departmental dose-optimization program  which includes automated exposure control, adjustment of the mA and/or kV according to patient size and/or use of iterative reconstruction technique. COMPARISON:  CT C Spine 02/24/10 FINDINGS: CT HEAD FINDINGS Brain: Subtle hypodensity in the anterior left frontal lobe (series 2, image 19) could represent an acute infarct or post-traumatic change. No evidence of hemorrhage, hydrocephalus, extra-axial collection or mass lesion/mass effect. Vascular: No hyperdense vessel or unexpected calcification. Skull: Soft tissue hematoma along the frontal scalp. No evidence calvarial fracture. Sinuses/Orbits: No acute finding. Other: None. CT CERVICAL SPINE FINDINGS Alignment: Straightening of the normal cervical lordosis. Skull base and vertebrae: Status post C3-C7 ACDF and C2-C3 posterior fusion. No acute fracture. No primary bone lesion or focal pathologic process. Soft tissues and spinal canal: No prevertebral fluid or swelling. No visible canal hematoma. Disc levels: Multilevel moderate spinal canal narrowing at C3-C7. Upper chest: Bullous change at the right lung  apex. Other: None IMPRESSION: 1. Subtle cortical hypodensity in the anterior left frontal lobe could represent an acute infarct or post-traumatic change. Brain MRI could be considered if this is a clinical concern. 2. Soft tissue hematoma along the frontal scalp. No evidence of calvarial fracture. 3. No acute cervical spine fracture. Electronically Signed   By: Lorenza Cambridge M.D.   On: 11/26/2022 10:29   DG Elbow Complete Right  Result Date: 11/26/2022 CLINICAL DATA:  Fall with right elbow pain. EXAM: RIGHT ELBOW - COMPLETE 3+ VIEW COMPARISON:  None Available. 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 No significant joint effusion. No evidence of acute fracture or dislocation. IMPRESSION: 1. No acute findings. 2. 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 Electronically Signed   By: Elberta Fortis M.D.   On: 11/26/2022 10:19   DG Hand  Complete Right  Result Date: 11/26/2022 CLINICAL DATA:  Fall with left hand pain. EXAM: RIGHT HAND - COMPLETE 3+ VIEW COMPARISON:  None Available. FINDINGS: Alignment and bone mineralization is normal. There are mild degenerative changes over the radiocarpal joint, carpal bones and interphalangeal joints as well as first carpometacarpal joint. No bony erosions. No acute fracture or dislocation. Punctate radiopaque focus over the soft tissues adjacent the volar/ulnar aspect of the base of the second middle phalanx possibly a small foreign body. IMPRESSION: 1. No acute findings. 2. Mild degenerative changes. 3. Possible punctate foreign body over the soft tissues adjacent the base of the second middle phalanx. Electronically Signed   By: Elberta Fortis M.D.   On: 11/26/2022 10:17      Signature  -   Susa Raring M.D on 11/27/2022 at 9:27 AM   -  To page go to [email or web address]

## 2022-11-28 ENCOUNTER — Inpatient Hospital Stay (HOSPITAL_COMMUNITY): Payer: Medicare Other

## 2022-11-28 DIAGNOSIS — I272 Pulmonary hypertension, unspecified: Secondary | ICD-10-CM

## 2022-11-28 DIAGNOSIS — J9621 Acute and chronic respiratory failure with hypoxia: Secondary | ICD-10-CM | POA: Diagnosis not present

## 2022-11-28 LAB — PROCALCITONIN: Procalcitonin: <0.1 ng/mL

## 2022-11-28 LAB — CBC WITH DIFFERENTIAL/PLATELET
Abs Immature Granulocytes: 0.06 10*3/uL (ref 0.00–0.07)
Basophils Absolute: 0 10*3/uL (ref 0.0–0.1)
Basophils Relative: 0 %
Eosinophils Absolute: 0 10*3/uL (ref 0.0–0.5)
Eosinophils Relative: 0 %
HCT: 38.9 % — ABNORMAL LOW (ref 39.0–52.0)
Hemoglobin: 12.6 g/dL — ABNORMAL LOW (ref 13.0–17.0)
Immature Granulocytes: 1 %
Lymphocytes Relative: 8 %
Lymphs Abs: 1 10*3/uL (ref 0.7–4.0)
MCH: 30.4 pg (ref 26.0–34.0)
MCHC: 32.4 g/dL (ref 30.0–36.0)
MCV: 94 fL (ref 80.0–100.0)
Monocytes Absolute: 1.2 10*3/uL — ABNORMAL HIGH (ref 0.1–1.0)
Monocytes Relative: 9 %
Neutro Abs: 10.8 10*3/uL — ABNORMAL HIGH (ref 1.7–7.7)
Neutrophils Relative %: 82 %
Platelets: 119 10*3/uL — ABNORMAL LOW (ref 150–400)
RBC: 4.14 MIL/uL — ABNORMAL LOW (ref 4.22–5.81)
RDW: 14.5 % (ref 11.5–15.5)
WBC: 13 10*3/uL — ABNORMAL HIGH (ref 4.0–10.5)
nRBC: 0 % (ref 0.0–0.2)

## 2022-11-28 LAB — BASIC METABOLIC PANEL
Anion gap: 6 (ref 5–15)
BUN: 39 mg/dL — ABNORMAL HIGH (ref 8–23)
CO2: 27 mmol/L (ref 22–32)
Calcium: 8.5 mg/dL — ABNORMAL LOW (ref 8.9–10.3)
Chloride: 103 mmol/L (ref 98–111)
Creatinine, Ser: 1.29 mg/dL — ABNORMAL HIGH (ref 0.61–1.24)
GFR, Estimated: 58 mL/min — ABNORMAL LOW (ref >60)
Glucose, Bld: 125 mg/dL — ABNORMAL HIGH (ref 70–99)
Potassium: 4.6 mmol/L (ref 3.5–5.1)
Sodium: 136 mmol/L (ref 135–145)

## 2022-11-28 LAB — MAGNESIUM: Magnesium: 2.1 mg/dL (ref 1.7–2.4)

## 2022-11-28 LAB — ECHOCARDIOGRAM COMPLETE
AV Mean grad: 2 mmHg
AV Peak grad: 3.6 mmHg
Ao pk vel: 0.94 m/s
Area-P 1/2: 3.76 cm2
Height: 73 in
S' Lateral: 2.8 cm
Weight: 3065.28 oz

## 2022-11-28 LAB — BRAIN NATRIURETIC PEPTIDE: B Natriuretic Peptide: 1318.6 pg/mL — ABNORMAL HIGH (ref 0.0–100.0)

## 2022-11-28 LAB — C-REACTIVE PROTEIN: CRP: 2.5 mg/dL — ABNORMAL HIGH (ref <1.0)

## 2022-11-28 MED ORDER — FUROSEMIDE 10 MG/ML IJ SOLN
40.0000 mg | Freq: Once | INTRAMUSCULAR | Status: AC
  Administered 2022-11-28: 40 mg via INTRAVENOUS
  Filled 2022-11-28: qty 4

## 2022-11-28 MED ORDER — ISOSORBIDE MONONITRATE ER 30 MG PO TB24
30.0000 mg | ORAL_TABLET | Freq: Every morning | ORAL | Status: DC
  Administered 2022-11-28 – 2022-11-30 (×3): 30 mg via ORAL
  Filled 2022-11-28 (×3): qty 1

## 2022-11-28 MED ORDER — METHYLPREDNISOLONE SODIUM SUCC 40 MG IJ SOLR
40.0000 mg | Freq: Two times a day (BID) | INTRAMUSCULAR | Status: DC
  Administered 2022-11-28 – 2022-11-30 (×5): 40 mg via INTRAVENOUS
  Filled 2022-11-28 (×5): qty 1

## 2022-11-28 NOTE — Progress Notes (Signed)
NAME:  Maurice Munoz, MRN:  [id number], DOB:  1945/07/15, LOS: 2 ADMISSION DATE:  11/26/2022, CONSULTATION DATE:  11/28/22 REFERRING MD:  TRH, CHIEF COMPLAINT:  DOE   History of Present Illness:  77 year old with signs of ILD dating back to at least 2016 presents with recurrent syncope and worsening dyspnea on exertion.  Notes passing out several times over the last few days.  With worsening dyspnea on exertion.  He can stop more frequently.  Does not check his oxygen very much at home.  Sounds like is not wearing oxygen as prescribed or recommended.  Workup in the emergency room revealed complete right ICA occlusion.  Also, CTA PE protocol was obtained that showed no PE but did demonstrate on my review interpretation severe IPF and honeycombing changes overall progressive over time, interlobular septal thickening, diffuse central groundglass opacities and bilateral pleural effusions concerning for pulmonary edema and cardiogenic volume overload.  He was transferred to [facility] for further evaluation.  Dyspnea been bad for the last 3 months or so.  Gradually worsening.  Followed by cardiology, Dr. Tresa Endo.  He states is not taking his Lasix as prescribed.  He recently had a left heart catheterization (history of PCI x 5) demonstrated nonobstructive CAD but did demonstrate pulmonary hypertension with PVR 6.89, LVEDP of 15.  Extensive pulmonary notes reviewed.  Extensive cardiology notes reviewed.  Extensive images reviewed.  Pertinent  Medical History  ILD/IPF, chronic hypoxemic respiratory failure 4 to 6 L at home baseline  Significant Hospital Events: Including procedures, antibiotic start and stop dates in addition to other pertinent events   6/7 repeat episodes of what sounds like syncope at home and worsening shortness of breath so presented to the ED  Interim History / Subjective:  O2 weaning  Objective   Blood pressure 125/64, pulse 69, temperature 97.9 F (36.6 C), temperature  source Oral, resp. rate 15, height 6\' 1"  (1.854 m), weight 86.9 kg, SpO2 96 %.        Intake/Output Summary (Last 24 hours) at 11/28/2022 0848 Last data filed at 11/28/2022 4034 Gross per 24 hour  Intake --  Output 1600 ml  Net -1600 ml    Filed Weights   11/26/22 0859 11/28/22 0433  Weight: 83.9 kg 86.9 kg    Examination: General: Frail elderly gentleman lying in bed HENT: Multiple abrasions noted on his face and head Neck: Supple, no JVP appreciated Lungs: Diminished in the bases, normal work of breathing on 7 L nasal cannula Cardiovascular: Warm, trace edema Abdomen: Nondistended, bowel sounds present MSK no synovitis, no joint effusion Neuro: No focal deficits, sensation appears intact Psych: Normal mood, full affect  Resolved Hospital Problem list     Assessment & Plan:  Acute on chronic hypoxemic respiratory failure: With CT images most consistent with volume overload.  He reports nonadherence to home diuretic therapy.  Diastolic dysfunction in the past on TTE.  BNP is very elevated.  He has underlying IPF which is his initial contributor to hypoxemia.  CRP mildly elevated.  However, do not feel like IPF or ILD exacerbation is primary driver of symptoms.  Procalcitonin negative.  He denies infectious symptoms.  Infection felt unlikely. -- Goal O2 sat 88%, wean as able -- Recommend aggressive diuresis -- Goal net negative at least 1 L daily -- Continue diuresis as blood pressure and kidney function allows -- IV Solu-Medrol  x 3 days, prednisone taper thereafter 40 mg for 5 days, 20 mg for 5 days, 10 mg for  5 days then stop -- Agree with repeat TTE -- Will need to establish new baseline oxygen requirement at rest and with exertion as he at admits to not using oxygen as recommended   Acute on chronic diastolic congestive heart failure: With acute exacerbation. -- Lasix as above -- Repeat TTE as above  Pulmonary hypertension: Likely multifactorial Group 1 disease given  elevated PVR, group 2 disease given diastolic function, large contribution group 3 disease given significant parenchymal disease and hypoxemia related to IPF. -- Discussion regarding therapies as an outpatient  IPF: Progressive over time.  First sign of ILD in 2016 on my review of serial CT scans. -- If not able to discharge in the coming days, encouraged to resume pirfenidone, will need to bring home supply can readdress during the week  PCCM will sign off. He has outpatient f/u in pulmonary clinic July 17 - will message to see if we can arrange shorter term follow up.  Best Practice (right click and "Reselect all SmartList Selections" daily)   Per primary  Labs   CBC: Recent Labs  Lab 11/26/22 0907 11/27/22 0608 11/28/22 0410  WBC 7.0 17.7* 13.0*  NEUTROABS 5.5  --  10.8*  HGB 14.4 15.7 12.6*  HCT 45.1 49.1 38.9*  MCV 96.2 93.5 94.0  PLT 197 188 119*     Basic Metabolic Panel: Recent Labs  Lab 11/26/22 0907 11/27/22 0608 11/28/22 0410  NA 138 137 136  K 4.3 5.0 4.6  CL 103 100 103  CO2 26 26 27   GLUCOSE 117* 123* 125*  BUN 30* 32* 39*  CREATININE 1.44* 1.69* 1.29*  CALCIUM 8.7* 9.1 8.5*  MG  --  2.1 2.1    GFR: Estimated Creatinine Clearance: 56.8 mL/min (A) (by C-G formula based on SCr of 1.29 mg/dL (H)). Recent Labs  Lab 11/26/22 0907 11/27/22 0608 11/28/22 0410  PROCALCITON  --  <0.10 <0.10  WBC 7.0 17.7* 13.0*     Liver Function Tests: No results for input(s): "AST", "ALT", "ALKPHOS", "BILITOT", "PROT", "ALBUMIN" in the last 168 hours. No results for input(s): "LIPASE", "AMYLASE" in the last 168 hours. No results for input(s): "AMMONIA" in the last 168 hours.  ABG    Component Value Date/Time   PHART 7.347 (L) 10/18/2022 0827   PCO2ART 49.2 (H) 10/18/2022 0827   PO2ART 128 (H) 10/18/2022 0827   HCO3 27.0 10/18/2022 0827   TCO2 28 10/18/2022 0827   O2SAT 99 10/18/2022 0827     Coagulation Profile: No results for input(s): "INR", "PROTIME"  in the last 168 hours.  Cardiac Enzymes: No results for input(s): "CKTOTAL", "CKMB", "CKMBINDEX", "TROPONINI" in the last 168 hours.  HbA1C: No results found for: "HGBA1C"  CBG: Recent Labs  Lab 11/26/22 0922  GLUCAP 96     Review of Systems:   No chest pain.  No orthopnea or PND.  Comprehensive review of systems otherwise negative.  Past Medical History:  He,  has a past medical history of Adrenal adenoma, left, Aortic atherosclerosis (HCC), Back pain, Bilateral carotid artery stenosis (followed by dr Tresa Endo), CAD (coronary artery disease), Cardiomegaly, Chronic constipation, Chronic pain syndrome, CKD (chronic kidney disease), stage III (HCC), DDD (degenerative disc disease), cervical, DDD (degenerative disc disease), lumbosacral, Dyspnea, Dyspnea on exertion, Gout, Grade II diastolic dysfunction, H/O epistaxis, History of acute pancreatitis (08/08/2014), History of kidney stones, HTN (hypertension), Hyperlipidemia, Interstitial lung disease (HCC), Left arm pain, Nocturia, Numbness of right foot, Peripheral edema, Pilonidal cyst, Pleural effusion on right, Pulmonary fibrosis (HCC),  S/P drug eluting coronary stent placement (10/11/2005), Wears dentures, and Wears glasses.   Surgical History:   Past Surgical History:  Procedure Laterality Date   ANTERIOR CERVICAL DECOMP/DISCECTOMY FUSION  12-25-2001     dr Terrilee Files [facility]   C3 -- C7   ANTERIOR CERVICAL DECOMP/DISCECTOMY FUSION  12-11-2012   dr Channing Mutters  [facility])   CARDIOVASCULAR STRESS TEST  08-23-2012    dr Tresa Endo   normal lexiscan nuclear study w/ no ischemia/  normal LV function and wall motion, ef 65%   CARDIOVASCULAR STRESS TEST     CORONARY ANGIOPLASTY  1999   PTCA to RCA & PDA   CORONARY ANGIOPLASTY WITH STENT PLACEMENT  10-11-2005  dr [name]   PCI and stenting to midLAD & dRCA (Cypher DES x2)   CORONARY BALLOON ANGIOPLASTY N/A 04/14/2018   Procedure: CORONARY BALLOON ANGIOPLASTY;  Surgeon: Lennette Bihari, MD;   Location: [facility] INVASIVE CV LAB;  Service: Cardiovascular;  Laterality: N/A;   CORONARY STENT INTERVENTION N/A 04/14/2018   Procedure: CORONARY STENT INTERVENTION;  Surgeon: Lennette Bihari, MD;  Location: [facility] INVASIVE CV LAB;  Service: Cardiovascular;  Laterality: N/A;   CYSTOSCOPY W/ URETERAL STENT PLACEMENT Left 02-11-2010     [facility]   LEFT HEART CATH N/A 04/14/2018   Procedure: Left Heart Cath;  Surgeon: Lennette Bihari, MD;  Location: [facility] INVASIVE CV LAB;  Service: Cardiovascular;  Laterality: N/A;   LEFT HEART CATH AND CORONARY ANGIOGRAPHY N/A 04/12/2018   Procedure: LEFT HEART CATH AND CORONARY ANGIOGRAPHY;  Surgeon: Corky Crafts, MD;  Location[facility] CV LAB;  Service: Cardiovascular;  Laterality: N/A;   LUMBAR LAMINECTOMY  2015   L5 -- S1   PILONIDAL CYST EXCISION  1980s   PILONIDAL CYST EXCISION N/A 03/31/2017   Procedure: EXCISION OF PILONIDAL DISEASE with Flap Rotation;  Surgeon: Romie Levee, MD;  Location: [facility];  Service: General;  Laterality: N/A;   RIGHT/LEFT HEART CATH AND CORONARY ANGIOGRAPHY N/A 10/18/2022   Procedure: RIGHT/LEFT HEART CATH AND CORONARY ANGIOGRAPHY;  Surgeon: Lennette Bihari, MD;  Location: [facility] INVASIVE CV LAB;  Service: Cardiovascular;  Laterality: N/A;   THROAT SURGERY  1996   per pt "removal piece of cryloid(?) that was pressing against throat"  states no issues since   TRANSTHORACIC ECHOCARDIOGRAM  08-24-2010  dr Tresa Endo   ef 50-55%/  mild MR/  trivial TR   WOUND DEBRIDEMENT N/A 10/06/2017   Procedure: DEBRIDEMENT WOUND PLACEMENT OF WOUND HEALING MATRIX;  Surgeon: Romie Levee, MD;  Location[facility] ;  Service: General;  Laterality: N/A;     Social History:   reports that he quit smoking about 34 years ago. His smoking use included cigarettes. He has a 75.00 pack-year smoking history. He quit smokeless tobacco use about 33 years ago. He reports that he does not drink alcohol and does not use drugs.   Family  History:  His family history includes Cancer in his father; Diabetes in his brother; Heart attack in his brother. There is no history of Colon cancer.   Allergies Allergies  Allergen Reactions   Ibuprofen Itching    Motrin brand   Neosporin [Neomycin-Bacitracin Zn-Polymyx] Swelling   Penicillins Rash     Has patient had a PCN reaction causing immediate rash, facial/tongue/throat swelling, SOB or lightheadedness with hypotension: No Has patient had a PCN reaction causing severe rash involving mucus membranes or skin necrosis: No Has patient had a PCN reaction that required hospitalization: No  Has patient had a PCN reaction occurring within the last 10 years: No If all of the above answers are "NO", then may proceed with Cephalosporin use.      Home Medications  Prior to Admission medications   Medication Sig Start Date End Date Taking? Authorizing Provider  allopurinol (ZYLOPRIM) 300 MG tablet Take 300 mg by mouth in the morning. 11/06/12  Yes [provider]  amLODipine (NORVASC) 5 MG tablet Take 1.5 tablets (7.5 mg total) by mouth daily. Patient taking differently: Take 5 mg by mouth daily. 10/18/22  Yes Lennette Bihari, MD  aspirin EC 81 MG tablet Take 81 mg by mouth in the morning.   Yes [provider]  atorvastatin (LIPITOR) 80 MG tablet TAKE ONE TABLET (80MG  TOTAL) BY MOUTH AT6PM Patient taking differently: Take 80 mg by mouth daily. 02/17/22  Yes Lennette Bihari, MD  clopidogrel (PLAVIX) 75 MG tablet TAKE ONE (1) TABLET BY MOUTH EVERY DAY 03/22/22  Yes Lennette Bihari, MD  cyanocobalamin (VITAMIN B12) 1000 MCG tablet Take 1,000 mcg by mouth in the morning.   Yes [provider]  ESBRIET 801 MG TABS Take 1 tablet (801 mg total) by mouth 3 (three) times daily with meals. Patient taking differently: Take 1 tablet by mouth See admin instructions. Take 1 tablet (801 mg) by mouth up to 3 times daily with meals. 08/06/22  Yes Mannam, Praveen, MD  isosorbide  mononitrate (IMDUR) 60 MG 24 hr tablet Take 60 mg  ( 1 tablet)  in the morning and  30 mg ( 1/2 tablet )  in the evening Patient taking differently: Take 30-60 mg by mouth See admin instructions. Take 60 mg  ( 1 tablet)  in the morning and  30 mg ( 1/2 tablet )  in the evening 09/28/22  Yes Lennette Bihari, MD  lisinopril (ZESTRIL) 2.5 MG tablet TAKE ONE TABLET (2.5MG  TOTAL) BY MOUTH DAILY Patient taking differently: Take 2.5 mg by mouth daily. 05/31/22  Yes Lennette Bihari, MD  metoprolol tartrate (LOPRESSOR) 50 MG tablet Take 2 tablets (100 mg total) by mouth every morning AND 1.5 tablets (75 mg total) every evening. 10/18/22  Yes Lennette Bihari, MD  nitroGLYCERIN (NITROSTAT) 0.4 MG SL tablet Place 1 tablet (0.4 mg total) under the tongue every 5 (five) minutes as needed for chest pain. 08/03/17 10/14/23 Yes Lennette Bihari, MD  oxyCODONE-acetaminophen (PERCOCET) 10-325 MG per tablet Take 1 tablet by mouth every 6 (six) hours as needed for pain. 10/11/12  Yes [provider]  predniSONE (DELTASONE) 10 MG tablet Take 10-60 mg by mouth daily as needed (gout flares).   Yes [provider]  tiZANidine (ZANAFLEX) 4 MG tablet Take 4 mg by mouth at bedtime.  11/08/12  Yes [provider]  torsemide (DEMADEX) 20 MG tablet TAKE ONE TABLET (20MG  TOTAL) BY MOUTH DAILY. IF ADDITIONAL SWELLING, MAY TAKE AN ADDITIONAL DOSE IN THE AFTERNOON Patient taking differently: Take 20 mg by mouth 2 (two) times a week. May take an additional dose the afternoon 02/16/21  Yes Lennette Bihari, MD  VITAMIN D PO Take 5,000 Units by mouth in the morning.   Yes [provider]  acetaminophen (TYLENOL) 500 MG tablet Take 1,000 mg by mouth every 6 (six) hours as needed (pain.).    [provider]  benzonatate (TESSALON) 200 MG capsule TAKE ONE CAPSULE (200MG  TOTAL) BY MOUTH TWO TIMES DAILY AS NEEDED FOR COUGH Patient not taking: Reported on 11/26/2022 07/23/20  Mannam, Praveen, MD  doxycycline  (VIBRAMYCIN) 100 MG capsule Take 100 mg by mouth 2 (two) times daily. 11/25/22   [provider]  OXYGEN Inhale 4-5 L into the lungs as needed (respiratory issues/with activity).    [provider]     Critical care time: n/a     Karren Burly, MD

## 2022-11-28 NOTE — Progress Notes (Signed)
PROGRESS NOTE                                                                                                                                                                                                             Patient Demographics:    Maurice Munoz, is a 77 y.o. male, DOB - 1946/05/12, ZOX:[id number]  Outpatient Primary MD for the patient is Maurice Nevins, MD    LOS - 2  Admit date - 11/26/2022    Chief Complaint  Patient presents with   Loss of Consciousness   Fall       Brief Narrative (HPI from H&P)   77 y.o. male, with past medical history of coronary artery disease, status post PCI, interstitial pulmonary fibrosis, with his right heart failure, hypertension, hyperlipidemia.  -Patient presents today secondary to syncope x2, patient reports he lives at home by himself, reports he was outside getting ready to go to the doctor's appointment, he felt lightheaded, he sat down, where he had unwitnessed syncopal event, reports when he woke up he was laying on the as felt, where he had multiple durations and bruises, family found him on the ground, they tried to set him up, where he had another syncopal events, reports he was out for 15 minutes, per EMS he was initially hypotensive in the 70s, where he responded to fluid bolus, blood sugar was 131, he is on home oxygen at baseline, but he was noted to be significantly hypoxic on his home oxygen requirement.  In ED patient with profound hypoxia on home oxygen 3 L, currently requiring nonrebreather and salter together, MRI obtained as part of syncope workup, there was a concern for carotid artery stenosis, discussed with neurology who recommended CTA head and neck, it was significant for complete occlusion of left cervical ICA, and 55% stenosis of the proximal right cervical ICA (Dr. Darlyn Munoz reported given his total occlusion, workup is as outpatient), CTA chest  negative for PE,  but with possible volume overload, [facility] hospitalist consulted to admit at [facility], ER, he was subsequently transferred to [facility] for further care.   Subjective:   Patient in bed, appears comfortable, denies any headache, no fever, no chest pain or pressure, improvement in shortness of breath , no abdominal pain. No focal weakness.   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   Pulmonary is following the patient, he has been placed on IV steroids along with supportive care with nebulizer treatments and additional oxygen, he uses 5 L at baseline, case discussed with pulmonary on 11/27/2022, some evidence of fluid overload as well responding well to diuretics, continue IV Lasix on 11/28/2022 as tolerated by blood pressure, echo pending, respiratory viral panel negative. Added I-S and flutter valve for pulmonary toiletry.   Syncope.  Likely due to hypoxia.  MRI brain nonacute.  PT OT, monitor orthostatics.  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  Per neurology Case was discussed upon admission outpatient follow-up.  Continue aspirin and statin for secondary prevention also on Plavix.  Avoid hypotension as cerebral perfusion can be compromised easily.  Dyslipidemia.  On statin.  CKDstage IIIb.  Mild AKI.  Currently on diuretics monitor cautiously.  Avoid nephrotoxins.  CAD.  No acute issues.  On DAPT and statin for secondary prevention.  Monitor.  Hypertension.  Blood pressure currently soft.  Blood pressure medications on hold.       Condition - Extremely Guarded  Family Communication  :  None  Code Status :  Full  Consults  :  PCCM  PUD Prophylaxis :  PPI   Procedures  :     TTE -   MRI - 1. No acute infarct or hemorrhage. Loss of the left ICA flow void. Recommend further characterization with  CTA or contrast-enhanced MRA of the head and neck. 2. 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 2. 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 3. No intracranial large vessel occlusion or high-grade stenosis. Aortic Atherosclerosis   CTA Chest - 1. No evidence of significant pulmonary embolus. 2. Cardiac enlargement with right heart prominence suggesting right heart failure. 3. New finding of bilateral pleural effusions with airspace and interstitial disease throughout the lungs likely indicating pulmonary edema. Multifocal pneumonia would be a less likely possibility. 4. Chronic underlying interstitial lung disease with diffuse peripheral distribution of pulmonary fibrosis and severe honeycomb changes. 5. Mediastinal and right hilar lymphadenopathy is increasing since prior study. Etiology is nonspecific, possibly reactive, neoplastic, or lymphoma. 6. Cholelithiasis with suggestion of gallbladder wall thickening. Consider ultrasound if there is clinical concern for acute cholecystitis. 7. Aortic atherosclerosis      Disposition Plan  :    Status is: Inpatient  DVT Prophylaxis  :    heparin injection 5,000 Units Start: 11/27/22 1400    Lab Results  Component Value Date   PLT 119 (L) 11/28/2022    Diet :  Diet Order             Diet Heart Room service appropriate? Yes; Fluid consistency: Thin; Fluid restriction: 1200 mL Fluid  Diet effective now                    Inpatient Medications  Scheduled Meds:  allopurinol  300 mg Oral q AM   aspirin EC  81 mg Oral q AM   atorvastatin  80 mg Oral Daily   cholecalciferol  5,000 Units Oral q AM   clopidogrel  75 mg Oral Daily   cyanocobalamin  1,000 mcg Oral q AM  heparin  5,000 Units Subcutaneous Q8H   isosorbide mononitrate  30 mg Oral q morning   methylPREDNISolone (SOLU-MEDROL)  injection  40 mg Intravenous Q12H   metoprolol tartrate  25 mg Oral BID   pantoprazole  40 mg Oral Daily   Pirfenidone  1 tablet Oral TID WC   tiZANidine  4 mg Oral QHS   Continuous Infusions:  azithromycin 500 mg (11/27/22 2128)   cefTRIAXone (ROCEPHIN)  IV 2 g (11/27/22 2022)   PRN Meds:.acetaminophen, albuterol, benzonatate, diltiazem, hydrALAZINE, nitroGLYCERIN  Antibiotics  :    Anti-infectives (From admission, onward)    Start     Dose/Rate Route Frequency Ordered Stop   11/27/22 1600  azithromycin (ZITHROMAX) 500 mg in sodium chloride 0.9 % 250 mL IVPB  Status:  Discontinued        500 mg 250 mL/hr over 60 Minutes Intravenous Every 24 hours 11/26/22 1942 11/26/22 2007   11/26/22 2007  azithromycin (ZITHROMAX) 500 mg in sodium chloride 0.9 % 250 mL IVPB        500 mg 250 mL/hr over 60 Minutes Intravenous Every 24 hours 11/26/22 2007     11/26/22 2006  cefTRIAXone (ROCEPHIN) 2 g in sodium chloride 0.9 % 100 mL IVPB        2 g 200 mL/hr over 30 Minutes Intravenous Every 24 hours 11/26/22 1942           Objective:   Vitals:   11/28/22 0005 11/28/22 0432 11/28/22 0433 11/28/22 0839  BP: 102/73 (!) 105/58  125/64  Pulse: 100 60  69  Resp: 19 16  15   Temp: 97.6 F (36.4 C) 97.9 F (36.6 C)  97.9 F (36.6 C)  TempSrc: Axillary Axillary  Oral  SpO2:    96%  Weight:   86.9 kg   Height:        Wt Readings from Last 3 Encounters:  11/28/22 86.9 kg  10/18/22 83 kg  09/28/22 83 kg     Intake/Output Summary (Last 24 hours) at 11/28/2022 1009 Last data filed at 11/28/2022 0907 Gross per 24 hour  Intake --  Output 1750 ml  Net -1750 ml     Physical Exam  Awake Alert, No new F.N deficits, Normal affect [place].AT,PERRAL Supple Neck, No JVD,   Symmetrical Chest wall movement, Good air movement bilaterally, fine rales RRR,No Gallops,Rubs or new Murmurs,  +ve B.Sounds, Abd Soft, No tenderness,   No Cyanosis, Clubbing or edema       Data Review:    Recent Labs   Lab 11/26/22 0907 11/27/22 0608 11/28/22 0410  WBC 7.0 17.7* 13.0*  HGB 14.4 15.7 12.6*  HCT 45.1 49.1 38.9*  PLT 197 188 119*  MCV 96.2 93.5 94.0  MCH 30.7 29.9 30.4  MCHC 31.9 32.0 32.4  RDW 14.8 14.7 14.5  LYMPHSABS 0.9  --  1.0  MONOABS 0.4  --  1.2*  EOSABS 0.1  --  0.0  BASOSABS 0.0  --  0.0    Recent Labs  Lab 11/26/22 0907 11/27/22 0430 11/27/22 0608 11/28/22 0410  NA 138  --  137 136  K 4.3  --  5.0 4.6  CL 103  --  100 103  CO2 26  --  26 27  ANIONGAP 9  --  11 6  GLUCOSE 117*  --  123* 125*  BUN 30*  --  32* 39*  CREATININE 1.44*  --  1.69* 1.29*  CRP  --   --  2.2* 2.5*  PROCALCITON  --   --  <0.10 <0.10  BNP 975.0* 2,446.0*  --  1,318.6*  MG  --   --  2.1 2.1  CALCIUM 8.7*  --  9.1 8.5*      Recent Labs  Lab 11/26/22 0907 11/27/22 0430 11/27/22 0608 11/28/22 0410  CRP  --   --  2.2* 2.5*  PROCALCITON  --   --  <0.10 <0.10  BNP 975.0* 2,446.0*  --  1,318.6*  MG  --   --  2.1 2.1  CALCIUM 8.7*  --  9.1 8.5*      Radiology Reports DG Chest Port 1 View  Result Date: 11/28/2022 CLINICAL DATA:  Shortness of breath EXAM: PORTABLE CHEST 1 VIEW COMPARISON:  11/27/2022 FINDINGS: Cardiac shadow is stable. Lungs are well aerated bilaterally. Diffuse chronic interstitial changes are again seen bilaterally. No new focal infiltrate is seen. Postsurgical changes in the cervical spine are noted. IMPRESSION: Chronic interstitial changes without acute abnormality. Electronically Signed   By: Alcide Clever M.D.   On: 11/28/2022 10:04   DG Chest Port 1 View  Result Date: 11/27/2022 CLINICAL DATA:  Shortness of breath. EXAM: PORTABLE CHEST 1 VIEW COMPARISON:  CT 11/26/2022 FINDINGS: Stable cardiomediastinal contours. Unchanged small bilateral pleural effusions. Severe changes of chronic interstitial lung disease is again seen throughout both lungs with diffuse interstitial reticulation, architectural distortion and scarring. Diffuse ground-glass attenuation and  patchy airspace opacification are unchanged from the previous CT. IMPRESSION: 1. No change in aeration to the lungs compared with previous CT. 2. Severe changes of chronic interstitial lung disease. Electronically Signed   By: Signa Kell M.D.   On: 11/27/2022 07:58   CT Angio Chest PE W/Cm &/Or Wo Cm  Result Date: 11/26/2022 CLINICAL DATA:  Pulmonary embolus suspected with high probability. Syncope and fall. Dizziness. EXAM: CT ANGIOGRAPHY CHEST WITH CONTRAST TECHNIQUE: Multidetector CT imaging of the chest was performed using the standard protocol during bolus administration of intravenous contrast. Multiplanar CT image reconstructions and MIPs were obtained to evaluate the vascular anatomy. RADIATION DOSE REDUCTION: This exam was performed according to the departmental dose-optimization program which includes automated exposure control, adjustment of the mA and/or kV according to patient size and/or use of iterative reconstruction technique. CONTRAST:  OMNIPAQUE IOHEXOL 350 MG/ML SOLN COMPARISON:  08/31/2022 FINDINGS: Cardiovascular: Technically adequate study with good opacification of the central and segmental pulmonary arteries. No focal filling defects. No evidence of significant pulmonary embolus. Cardiac enlargement with right heart predominance. No pericardial effusions. Normal caliber thoracic aorta. No aortic dissection. Calcification in the aorta and coronary arteries. Mediastinum/Nodes: Thyroid gland is unremarkable. Esophagus is decompressed. Mediastinal lymphadenopathy and right hilar lymphadenopathy. Largest left aortopulmonic window nodes measure 1.9 cm short axis dimension. Lymph nodes are enlarged since the prior study. Lungs/Pleura: Severe interstitial infiltrates throughout the lungs with a mostly peripheral and basilar distribution. Appearances are most consistent with usual interstitial pneumonitis. Severe honeycomb changes particularly in the lung bases. These findings are  similar to the prior study. However there is interval development of small bilateral pleural effusions as well as diffuse airspace and interstitial infiltration in the aerated portions of the lungs. Changes most likely represent developing pulmonary edema although multifocal pneumonia could also have this appearance. Bulla in the right apex. Upper Abdomen: Cholelithiasis with single stone in the gallbladder neck. Gallbladder wall appears mildly thickened, possibly indicating acute cholecystitis if in the appropriate clinical setting. Consider ultrasound for further evaluation if clinically indicated. Otherwise no acute changes demonstrated in  the upper abdomen. Musculoskeletal: Degenerative changes in the spine. Postoperative changes in the cervical spine. Review of the MIP images confirms the above findings. IMPRESSION: 1. No evidence of significant pulmonary embolus. 2. Cardiac enlargement with right heart prominence suggesting right heart failure. 3. New finding of bilateral pleural effusions with airspace and interstitial disease throughout the lungs likely indicating pulmonary edema. Multifocal pneumonia would be a less likely possibility. 4. Chronic underlying interstitial lung disease with diffuse peripheral distribution of pulmonary fibrosis and severe honeycomb changes. 5. Mediastinal and right hilar lymphadenopathy is increasing since prior study. Etiology is nonspecific, possibly reactive, neoplastic, or lymphoma. 6. Cholelithiasis with suggestion of gallbladder wall thickening. Consider ultrasound if there is clinical concern for acute cholecystitis. 7. Aortic atherosclerosis. Electronically Signed   By: Burman Nieves M.D.   On: 11/26/2022 16:14   CT ANGIO HEAD NECK W WO CM  Result Date: 11/26/2022 CLINICAL DATA:  Neuro deficit, acute, stroke suspected. EXAM: CT ANGIOGRAPHY HEAD AND NECK WITH AND WITHOUT CONTRAST TECHNIQUE: Multidetector CT imaging of the head and neck was performed using the  standard protocol during bolus administration of intravenous contrast. Multiplanar CT image reconstructions and MIPs were obtained to evaluate the vascular anatomy. Carotid stenosis measurements (when applicable) are obtained utilizing NASCET criteria, using the distal internal carotid diameter as the denominator. RADIATION DOSE REDUCTION: This exam was performed according to the departmental dose-optimization program which includes automated exposure control, adjustment of the mA and/or kV according to patient size and/or use of iterative reconstruction technique. CONTRAST:  OMNIPAQUE IOHEXOL 350 MG/ML SOLN COMPARISON:  MRI brain 11/26/2022. FINDINGS: CTA NECK FINDINGS Aortic arch: Three-vessel arch configuration. Atherosclerotic calcifications of the aortic arch and arch vessel origins. Arch vessel origins are patent. Right carotid system: Predominantly calcified plaque results in 55% stenosis of the proximal right cervical ICA. 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 Reconstitution at the communicating segment. Vertebral arteries: Codominant. No evidence of dissection, stenosis (50% or greater), or occlusion. Skeleton: Prior C3-C7 ACDF and C2-3 posterior spinal fusion. No suspicious bone lesions. Other neck: Unremarkable. Upper chest: Fibrosis and edema with apical bullae. Attention on same day CTA chest report. Review of the MIP images confirms the above findings CTA HEAD FINDINGS Anterior circulation: Reconstitution of the left ICA at the communicating segment. Right ICA is patent. The proximal ACAs and MCAs are patent without stenosis or aneurysm. Distal branches are symmetric. Posterior circulation: Normal basilar artery. The SCAs, AICAs and PICAs are patent proximally. The PCAs are patent proximally without stenosis or aneurysm. Distal branches are symmetric. Venous sinuses: Patent. Anatomic variants: None. Review of the MIP images confirms  the above findings IMPRESSION: 1. 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 2. 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 3. No intracranial large vessel occlusion or high-grade stenosis. Aortic Atherosclerosis (ICD10-I70.0). Electronically Signed   By: Orvan Falconer M.D.   On: 11/26/2022 16:10   MR BRAIN WO CONTRAST  Result Date: 11/26/2022 CLINICAL DATA:  Mental status change, unknown cause. EXAM: MRI HEAD WITHOUT CONTRAST TECHNIQUE: Multiplanar, multiecho pulse sequences of the brain and surrounding structures were obtained without intravenous contrast. COMPARISON:  Head CT 11/26/2022. FINDINGS: Brain: No acute infarct or hemorrhage. 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 No hydrocephalus or extra-axial collection. No mass or midline shift. No foci of abnormal susceptibility. Vascular: Loss of the left ICA flow void. Skull and upper  cervical spine: Partially visualized susceptibility artifact from prior cervical spinal fusion. Otherwise normal marrow signal. Sinuses/Orbits: Unremarkable. Other: None. IMPRESSION: 1. No acute infarct or hemorrhage. Loss of the left ICA flow void. Recommend further characterization with CTA or contrast-enhanced MRA of the head and neck. 2. 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 Electronically Signed   By: Orvan Falconer M.D.   On: 11/26/2022 13:05   DG Hand Complete Left  Result Date: 11/26/2022 CLINICAL DATA:  Fall with left hand pain. EXAM: LEFT HAND - COMPLETE 3+ VIEW COMPARISON:  None Available. FINDINGS: Bone mineralization is normal. Alignment is normal. No acute fracture or dislocation. Punctate radiodensity over the soft tissues adjacent the fourth D IP joint which may represent a tiny foreign body. Mild degenerate changes over the radiocarpal joint, radial side of the carpal bones, interphalangeal joints as well as the first through third MCP joints. Old ulnar styloid fracture.  IMPRESSION: 1. No acute findings. 2. Mild degenerative changes. 3. Possible tiny foreign body over the soft tissues adjacent the fourth DIP joint. Electronically Signed   By: Elberta Fortis M.D.   On: 11/26/2022 10:37   CT HEAD WO CONTRAST  Result Date: 11/26/2022 CLINICAL DATA:  Neck trauma EXAM: CT HEAD WITHOUT CONTRAST CT CERVICAL SPINE WITHOUT CONTRAST TECHNIQUE: Multidetector CT imaging of the head and cervical spine was performed following the standard protocol without intravenous contrast. Multiplanar CT image reconstructions of the cervical spine were also generated. RADIATION DOSE REDUCTION: This exam was performed according to the departmental dose-optimization program which includes automated exposure control, adjustment of the mA and/or kV according to patient size and/or use of iterative reconstruction technique. COMPARISON:  CT C Spine 02/24/10 FINDINGS: CT HEAD FINDINGS Brain: Subtle hypodensity in the anterior left frontal lobe (series 2, image 19) could represent an acute infarct or post-traumatic change. No evidence of hemorrhage, hydrocephalus, extra-axial collection or mass lesion/mass effect. Vascular: No hyperdense vessel or unexpected calcification. Skull: Soft tissue hematoma along the frontal scalp. No evidence calvarial fracture. Sinuses/Orbits: No acute finding. Other: None. CT CERVICAL SPINE FINDINGS Alignment: Straightening of the normal cervical lordosis. Skull base and vertebrae: Status post C3-C7 ACDF and C2-C3 posterior fusion. No acute fracture. No primary bone lesion or focal pathologic process. Soft tissues and spinal canal: No prevertebral fluid or swelling. No visible canal hematoma. Disc levels: Multilevel moderate spinal canal narrowing at C3-C7. Upper chest: Bullous change at the right lung apex. Other: None IMPRESSION: 1. Subtle cortical hypodensity in the anterior left frontal lobe could represent an acute infarct or post-traumatic change. Brain MRI could be considered if  this is a clinical concern. 2. Soft tissue hematoma along the frontal scalp. No evidence of calvarial fracture. 3. No acute cervical spine fracture. Electronically Signed   By: Lorenza Cambridge M.D.   On: 11/26/2022 10:29   CT CERVICAL SPINE WO CONTRAST  Result Date: 11/26/2022 CLINICAL DATA:  Neck trauma EXAM: CT HEAD WITHOUT CONTRAST CT CERVICAL SPINE WITHOUT CONTRAST TECHNIQUE: Multidetector CT imaging of the head and cervical spine was performed following the standard protocol without intravenous contrast. Multiplanar CT image reconstructions of the cervical spine were also generated. RADIATION DOSE REDUCTION: This exam was performed according to the departmental dose-optimization program which includes automated exposure control, adjustment of the mA and/or kV according to patient size and/or use of iterative reconstruction technique. COMPARISON:  CT C Spine 02/24/10 FINDINGS: CT HEAD FINDINGS Brain: Subtle hypodensity in the anterior left frontal lobe (series 2, image 19) could represent an acute  infarct or post-traumatic change. No evidence of hemorrhage, hydrocephalus, extra-axial collection or mass lesion/mass effect. Vascular: No hyperdense vessel or unexpected calcification. Skull: Soft tissue hematoma along the frontal scalp. No evidence calvarial fracture. Sinuses/Orbits: No acute finding. Other: None. CT CERVICAL SPINE FINDINGS Alignment: Straightening of the normal cervical lordosis. Skull base and vertebrae: Status post C3-C7 ACDF and C2-C3 posterior fusion. No acute fracture. No primary bone lesion or focal pathologic process. Soft tissues and spinal canal: No prevertebral fluid or swelling. No visible canal hematoma. Disc levels: Multilevel moderate spinal canal narrowing at C3-C7. Upper chest: Bullous change at the right lung apex. Other: None IMPRESSION: 1. Subtle cortical hypodensity in the anterior left frontal lobe could represent an acute infarct or post-traumatic change. Brain MRI could be  considered if this is a clinical concern. 2. Soft tissue hematoma along the frontal scalp. No evidence of calvarial fracture. 3. No acute cervical spine fracture. Electronically Signed   By: Lorenza Cambridge M.D.   On: 11/26/2022 10:29   DG Elbow Complete Right  Result Date: 11/26/2022 CLINICAL DATA:  Fall with right elbow pain. EXAM: RIGHT ELBOW - COMPLETE 3+ VIEW COMPARISON:  None Available. 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 No significant joint effusion. No evidence of acute fracture or dislocation. IMPRESSION: 1. No acute findings. 2. 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 Electronically Signed   By: Elberta Fortis M.D.   On: 11/26/2022 10:19   DG Hand Complete Right  Result Date: 11/26/2022 CLINICAL DATA:  Fall with left hand pain. EXAM: RIGHT HAND - COMPLETE 3+ VIEW COMPARISON:  None Available. FINDINGS: Alignment and bone mineralization is normal. There are mild degenerative changes over the radiocarpal joint, carpal bones and interphalangeal joints as well as first carpometacarpal joint. No bony erosions. No acute fracture or dislocation. Punctate radiopaque focus over the soft tissues adjacent the volar/ulnar aspect of the base of the second middle phalanx possibly a small foreign body. IMPRESSION: 1. No acute findings. 2. Mild degenerative changes. 3. Possible punctate foreign body over the soft tissues adjacent the base of the second middle phalanx. Electronically Signed   By: Elberta Fortis M.D.   On: 11/26/2022 10:17      Signature  -   Susa Raring M.D on 11/28/2022 at 10:09 AM   -  To page go to [email or web address]

## 2022-11-28 NOTE — Progress Notes (Signed)
Inpatient Rehab Admissions Coordinator:  ? ?Per therapy recommendations,  patient was screened for CIR candidacy by Keiasha Diep, MS, CCC-SLP. At this time, Pt. Appears to be a a potential candidate for CIR. I will place   order for rehab consult per protocol for full assessment. Please contact me any with questions. ? ?Salli Bodin, MS, CCC-SLP ?Rehab Admissions Coordinator  ?[phone] (celll) ?[phone] (office) ? ?

## 2022-11-29 DIAGNOSIS — J9621 Acute and chronic respiratory failure with hypoxia: Secondary | ICD-10-CM | POA: Diagnosis not present

## 2022-11-29 DIAGNOSIS — Z7189 Other specified counseling: Secondary | ICD-10-CM | POA: Diagnosis not present

## 2022-11-29 DIAGNOSIS — Z515 Encounter for palliative care: Secondary | ICD-10-CM | POA: Diagnosis not present

## 2022-11-29 LAB — BASIC METABOLIC PANEL
Anion gap: 8 (ref 5–15)
BUN: 40 mg/dL — ABNORMAL HIGH (ref 8–23)
CO2: 31 mmol/L (ref 22–32)
Calcium: 8.6 mg/dL — ABNORMAL LOW (ref 8.9–10.3)
Chloride: 97 mmol/L — ABNORMAL LOW (ref 98–111)
Creatinine, Ser: 1.33 mg/dL — ABNORMAL HIGH (ref 0.61–1.24)
GFR, Estimated: 56 mL/min — ABNORMAL LOW (ref >60)
Glucose, Bld: 131 mg/dL — ABNORMAL HIGH (ref 70–99)
Potassium: 4.3 mmol/L (ref 3.5–5.1)
Sodium: 136 mmol/L (ref 135–145)

## 2022-11-29 LAB — CBC WITH DIFFERENTIAL/PLATELET
Abs Immature Granulocytes: 0.11 10*3/uL — ABNORMAL HIGH (ref 0.00–0.07)
Basophils Absolute: 0 10*3/uL (ref 0.0–0.1)
Basophils Relative: 0 %
Eosinophils Absolute: 0 10*3/uL (ref 0.0–0.5)
Eosinophils Relative: 0 %
HCT: 39.5 % (ref 39.0–52.0)
Hemoglobin: 13 g/dL (ref 13.0–17.0)
Immature Granulocytes: 1 %
Lymphocytes Relative: 6 %
Lymphs Abs: 0.8 10*3/uL (ref 0.7–4.0)
MCH: 31.3 pg (ref 26.0–34.0)
MCHC: 32.9 g/dL (ref 30.0–36.0)
MCV: 95.2 fL (ref 80.0–100.0)
Monocytes Absolute: 0.7 10*3/uL (ref 0.1–1.0)
Monocytes Relative: 5 %
Neutro Abs: 11.2 10*3/uL — ABNORMAL HIGH (ref 1.7–7.7)
Neutrophils Relative %: 88 %
Platelets: 127 10*3/uL — ABNORMAL LOW (ref 150–400)
RBC: 4.15 MIL/uL — ABNORMAL LOW (ref 4.22–5.81)
RDW: 14.6 % (ref 11.5–15.5)
WBC: 12.8 10*3/uL — ABNORMAL HIGH (ref 4.0–10.5)
nRBC: 0 % (ref 0.0–0.2)

## 2022-11-29 LAB — MAGNESIUM: Magnesium: 2 mg/dL (ref 1.7–2.4)

## 2022-11-29 LAB — C-REACTIVE PROTEIN: CRP: 1.3 mg/dL — ABNORMAL HIGH (ref <1.0)

## 2022-11-29 LAB — BRAIN NATRIURETIC PEPTIDE: B Natriuretic Peptide: 1203.5 pg/mL — ABNORMAL HIGH (ref 0.0–100.0)

## 2022-11-29 LAB — PROCALCITONIN: Procalcitonin: <0.1 ng/mL

## 2022-11-29 MED ORDER — FUROSEMIDE 10 MG/ML IJ SOLN
40.0000 mg | Freq: Once | INTRAMUSCULAR | Status: AC
  Administered 2022-11-29: 40 mg via INTRAVENOUS
  Filled 2022-11-29: qty 4

## 2022-11-29 MED ORDER — MORPHINE SULFATE (PF) 2 MG/ML IV SOLN: 1.0000 mg | Freq: Two times a day (BID) | INTRAVENOUS | Status: DC | PRN

## 2022-11-29 NOTE — Progress Notes (Signed)
Palliative Medicine Inpatient Follow Up Note HPI: Maurice Munoz  is a 77 y.o. male, with past medical history of coronary artery disease, status post PCI, interstitial pulmonary fibrosis, with his right heart failure, hypertension, hyperlipidemia. Transferred from [facility] in the setting of increasing O2 needs. The Palliative care team has been asked to get involved for further goals of care conversations.   Today's Discussion 11/29/2022  *Please note that this is a verbal dictation therefore any spelling or grammatical errors are due to the "Dragon Medical One" system interpretation.  Chart reviewed inclusive of vital signs, progress notes, laboratory results, and diagnostic images.   I met with Maurice Munoz this morning in the presence of his best friend(s) and discussed the plan for his granddaughter to come in this afternoon to complete both a MOST form as well as HCPOA documents. He vocalizes understanding.   Created space and opportunity for patient to explore thoughts feelings and fears regarding current medical situation. He shares that he has improved greatly with "removing the fluid". We discussed that he has underlying heart failure and the diuresis has helped his work of breathing significantly. He shares, "I've never had a heart attack." I was able to then educate Maurice Munoz on heart failure, it's causes, it's progression, treatment, and ongoing need for health maintenance.  ____________________________________ Addendum:  Family meeting held this afternoon with Maurice Munoz and his granddaughter, Maurice Munoz and step son Maurice Munoz.   We identified that Maurice Munoz would like his granddaughter to be his surrogate decision maker and HCPOA If he were ever incapacitated. He was able to complete these documents in my presence.  Discussed cardiopulmonary resuscitation status. Maurice Munoz is aware of the possible poor outcomes as he went through this with his wife though he shares he would very much like an effort  make to sustain his life. If he were in a debilitated state he shares that his granddaughter, Maurice Munoz is aware of his wishes. He was also able to complete a living will in my presence. He would not want to be dependent on machines to live nor would he desire being in a bed for the rest of his life.   Reviewed goals at this time to treat what is treatable.   Discussed that living will completion will be formalized by our chaplain team tomorrow.   Questions and concerns addressed/Palliative Support Provided.   Objective Assessment: Vital Signs Vitals:   11/29/22 0413 11/29/22 0827  BP: 118/62 (!) 100/57  Pulse: 60 61  Resp: 13 20  Temp: 97.9 F (36.6 C) 97.8 F (36.6 C)  SpO2:  98%    Intake/Output Summary (Last 24 hours) at 11/29/2022 1228 Last data filed at 11/29/2022 8295 Gross per 24 hour  Intake --  Output 1600 ml  Net -1600 ml   Last Weight  Most recent update: 11/29/2022  4:14 AM    Weight  86.5 kg (190 lb 11.2 oz)            Gen: Elderly Caucasian M in NAD HEENT: forehead abrasion, moist mucous membranes CV: Irregular rate and regular rhythm  PULM:  On 3LPM of [place], breathing is even and nonlabored ABD: soft/nontender  EXT: muliple abrasions  Neuro: Alert and oriented x3   SUMMARY OF RECOMMENDATIONS   Full Code   Allow time for outcomes  Appreciate chaplain helping with AD completion   Goals at this time are for continued improvement   TOC - OP Palliative support on discharge  Possible CIR placement If accepted  Ongoing incremental PMT support  Time Spent: 65 Billing based on MDM: High ______________________________________________________________________________________ Maurice Munoz [place] Palliative Medicine Team Team Cell Phone: [phone] Please utilize secure chat with additional questions, if there is no response within 30 minutes please call the above phone number  Palliative Medicine Team providers are available by phone from  7am to 7pm daily and can be reached through the team cell phone.  Should this patient require assistance outside of these hours, please call the patient's attending physician.

## 2022-11-29 NOTE — Care Management Important Message (Signed)
Important Message  Patient Details  Name: NAZARETH KIRK MRN: [id number] Date of Birth: [birth year]   Medicare Important Message Given:  Yes     Dorena Bodo 11/29/2022, 4:01 PM

## 2022-11-29 NOTE — Evaluation (Signed)
Occupational Therapy Evaluation Patient Details Name: JACQUELYN ANTONY MRN: [id number] DOB: 1946-03-22 Today's Date: 11/29/2022   History of Present Illness 77 y/o M admitted to [facility] on 6/7 after a fall and loss of consciousness. CTA showed complete occlusion of left cervical ICA, CTA chest negative for PE but with possible volume overload. X-rays of R elbow and B hands negative for acute injury. PMHx: CAD s/p PCI, interstitial pulmonary fibrosis, right heart failure, HTN, HLD.   Clinical Impression   Patient admitted for the diagnosis above.  PTA he lives alone, has assist as needed from his brother who lives locally.  Patient is O2 dependent, able to complete his own ADL at baseline, completes his own meal prep and light iADL.  His brother assists with heavy iADL and groceries.  Patient continues to drive locally.  Currently he presents with R knee pain, and soreness to R arm.  The patient is needing Min A for very basic transfers, and as of yet has not been able to walk any household distances.  He is needing close Min A for ADL completion from a sit to stand level.  Patient did a little better this afternoon than he did with PT earlier, he did not experience any dizziness or drops in BP.  OT will continue efforts in the acute setting to address deficits, and Patient will benefit from intensive inpatient follow up therapy, >3 hours/day .      Recommendations for follow up therapy are one component of a multi-disciplinary discharge planning process, led by the attending physician.  Recommendations may be updated based on patient status, additional functional criteria and insurance authorization.   Assistance Recommended at Discharge Frequent or constant Supervision/Assistance  Patient can return home with the following Help with stairs or ramp for entrance;Assist for transportation;Assistance with cooking/housework;A little help with bathing/dressing/bathroom;A lot of help with walking and/or  transfers    Functional Status Assessment  Patient has had a recent decline in their functional status and demonstrates the ability to make significant improvements in function in a reasonable and predictable amount of time.  Equipment Recommendations  None recommended by OT    Recommendations for Other Services       Precautions / Restrictions Precautions Precautions: Fall Precaution Comments: watch O2 and BP Restrictions Weight Bearing Restrictions: No      Mobility Bed Mobility Overal bed mobility: Needs Assistance Bed Mobility: Sit to Supine       Sit to supine: Min guard        Transfers Overall transfer level: Needs assistance Equipment used: Rolling walker (2 wheels) Transfers: Sit to/from Stand, Bed to chair/wheelchair/BSC Sit to Stand: Min assist Stand pivot transfers: Min assist                Balance Overall balance assessment: Needs assistance Sitting-balance support: Feet supported Sitting balance-Leahy Scale: Good     Standing balance support: Reliant on assistive device for balance Standing balance-Leahy Scale: Poor                             ADL either performed or assessed with clinical judgement   ADL Overall ADL's : Needs assistance/impaired Eating/Feeding: Independent;Sitting   Grooming: Wash/dry hands;Wash/dry face;Set up;Sitting   Upper Body Bathing: Minimal assistance;Sitting   Lower Body Bathing: Minimal assistance;Sit to/from stand   Upper Body Dressing : Minimal assistance;Moderate assistance;Sitting   Lower Body Dressing: Minimal assistance;Sit to/from stand   Toilet Transfer: Minimal assistance;Stand-pivot;BSC/3in1  Vision Patient Visual Report: No change from baseline       Perception     Praxis      Pertinent Vitals/Pain Pain Assessment Pain Assessment: Faces Faces Pain Scale: Hurts little more Pain Location: B knees, R elbow Pain Descriptors / Indicators:  Tender Pain Intervention(s): Monitored during session     Hand Dominance Right   Extremity/Trunk Assessment Upper Extremity Assessment Upper Extremity Assessment: Overall WFL for tasks assessed   Lower Extremity Assessment Lower Extremity Assessment: Defer to PT evaluation   Cervical / Trunk Assessment Cervical / Trunk Assessment: Normal   Communication Communication Communication: No difficulties   Cognition Arousal/Alertness: Awake/alert Behavior During Therapy: WFL for tasks assessed/performed Overall Cognitive Status: Within Functional Limits for tasks assessed                                       General Comments       Exercises     Shoulder Instructions      Home Living Family/patient expects to be discharged to:: Private residence Living Arrangements: Alone Available Help at Discharge: Family;Friend(s);Available 24 hours/day Type of Home: House Home Access: Stairs to enter [employer] of Steps: 1 threshold Entrance Stairs-Rails: None Home Layout: One level     Bathroom Shower/Tub: Producer, television/film/video: Handicapped height Bathroom Accessibility: Yes How Accessible: Accessible via walker Home Equipment: Insurance risk surveyor (2 wheels);Cane - single point;Shower seat - built in;Grab bars - tub/shower;Hand held shower head          Prior Functioning/Environment Prior Level of Function : Independent/Modified Independent;History of Falls (last six months);Driving             Mobility Comments: RW in the house, has electric scooter he uses for community distances, 1-2 falls over the past 6 months besides the ones causing admission ADLs Comments: independent.  Brother assist with iADL and brings him his groceries.        OT Problem List: Decreased strength;Decreased range of motion;Decreased activity tolerance;Impaired balance (sitting and/or standing);Decreased safety awareness;Pain      OT  Treatment/Interventions: Self-care/ADL training;Therapeutic activities;Patient/family education;Balance training;DME and/or AE instruction    OT Goals(Current goals can be found in the care plan section) Acute Rehab OT Goals Patient Stated Goal: Get back to walking OT Goal Formulation: With patient Time For Goal Achievement: 12/13/22 Potential to Achieve Goals: Good ADL Goals Pt Will Perform Grooming: with modified independence;standing Pt Will Perform Lower Body Dressing: with modified independence;sit to/from stand Pt Will Transfer to Toilet: with modified independence;ambulating;regular height toilet  OT Frequency: Min 2X/week    Co-evaluation              AM-PAC OT "6 Clicks" Daily Activity     Outcome Measure Help from another person eating meals?: None Help from another person taking care of personal grooming?: None Help from another person toileting, which includes using toliet, bedpan, or urinal?: A Little Help from another person bathing (including washing, rinsing, drying)?: A Little Help from another person to put on and taking off regular upper body clothing?: A Little Help from another person to put on and taking off regular lower body clothing?: A Little 6 Click Score: 20   End of Session Equipment Utilized During Treatment: Rolling walker (2 wheels);Oxygen Nurse Communication: Mobility status  Activity Tolerance: Patient tolerated treatment well Patient left: in bed;with call bell/phone within reach;with family/visitor present  OT Visit Diagnosis: Unsteadiness on feet (R26.81);History of falling (Z91.81);Pain Pain - Right/Left: Right Pain - part of body: Arm                Time: 1400-1421 OT Time Calculation (min): 21 min Charges:  OT General Charges $OT Visit: 1 Visit OT Evaluation $OT Eval Moderate Complexity: 1 Mod  11/29/2022  RP, OTR/L  Acute Rehabilitation Services  Office:  [phone]   Suzanna Obey 11/29/2022, 4:08 PM

## 2022-11-29 NOTE — Progress Notes (Signed)
This chaplain responded to PMT NP-Michelle referral for creating the Pt. Advance Directive.   The Pt. is sitting up in the bedside recliner talking to friends of 30 years at the time of the visit. The chaplain understands the Pt. prefers his granddaughter-Melanie be present to discuss HCPOA. The chaplain understands [name] will arrive at 3:30pm today. The chaplain will attempt a revisit.  Chaplain Stephanie Acre [phone]

## 2022-11-29 NOTE — TOC Progression Note (Signed)
Transition of Care [facility]) - Initial/Assessment Note    Patient Details  Name: Maurice Munoz MRN: [id number] Date of Birth: 10/04/1945  [facility]) CM/SW Contact:    Ralene Bathe, LCSW Phone Number: 11/29/2022, 9:56 AM  Clinical Narrative:                 TOC following patient for  d/c planning needs once medically stable.  CIR is following and will complete full assessment a patient is a potential candidate.    Patient Goals and CMS Choice            Expected Discharge Plan and Services                                              Prior Living Arrangements/Services                       Activities of Daily Living Home Assistive Devices/Equipment: None ADL Screening (condition at time of admission) Patient's cognitive ability adequate to safely complete daily activities?: Yes Is the patient deaf or have difficulty hearing?: No Does the patient have difficulty seeing, even when wearing glasses/contacts?: No Does the patient have difficulty concentrating, remembering, or making decisions?: No Patient able to express need for assistance with ADLs?: Yes Does the patient have difficulty dressing or bathing?: No Independently performs ADLs?: Yes (appropriate for developmental age) Does the patient have difficulty walking or climbing stairs?: No Weakness of Legs: Both Weakness of Arms/Hands: Both  Permission Sought/Granted                  Emotional Assessment              Admission diagnosis:  Hypoxia [R09.02] Multiple abrasions [T07.XXXA] Acute on chronic respiratory failure with hypoxia (HCC) [J96.21] Syncope, unspecified syncope type [R55] Patient Active Problem List   Diagnosis Date Noted   Acute on chronic respiratory failure with hypoxia (HCC) 11/26/2022   Chronic respiratory failure with hypoxia (HCC) 12/17/2019   Therapeutic drug monitoring 12/17/2019   NSTEMI (non-ST elevated myocardial infarction) (HCC)  04/13/2018   CKD (chronic kidney disease) stage 3, GFR 30-59 ml/min (HCC) 04/12/2018   IPF (idiopathic pulmonary fibrosis) (HCC) 04/12/2018   Essential hypertension    CAD in native artery 04/11/2018   Arthralgia of right temporomandibular joint 02/03/2016   Hyperlipidemia LDL goal <70 09/25/2014   Lower extremity edema 09/25/2014   Nausea    Upper abdominal pain    Pancreatitis-Idiopathy acute 2.18.16 admitted [facility] 08/08/2014   Gout 08/08/2014   CAD s/p stenting 2007, prior LAD h/o 199, last myoview 3/15 Low risk 11/27/2012   Edema 11/27/2012   Obesity (BMI 30-39.9) 11/27/2012   Cervical disc disease s/p diskecktomy 2003  11/27/2012   GOUT 10/13/2009   KNEE PAIN 09/08/2009   PATELLAR TENDINITIS 09/08/2009   PCP:  [name], MD Pharmacy:   [facility] - [place], [place] - [address] [place] [address] Phone: [phone] Fax: [phone]  [facility] - [place], [place] - [address] [place] [address] Phone: [phone] Fax: [phone]     Social Determinants of Health (SDOH) Social History: SDOH Screenings   Food Insecurity: No Food Insecurity (11/27/2022)  Housing: Low Risk  (11/27/2022)  Transportation Needs: No Transportation Needs (11/27/2022)  Utilities: Not At Risk (11/27/2022)  Tobacco Use: Medium Risk (11/27/2022)   SDOH Interventions:     Readmission Risk Interventions     No data to display

## 2022-11-29 NOTE — Progress Notes (Addendum)
Inpatient Rehab Coordinator Note:  I met with patient at bedside, granddaughter, [name] by phone to discuss CIR recommendations and goals/expectations of CIR stay.  We reviewed 3 hrs/day of therapy, physician follow up, and average length of stay 2 weeks (dependent upon progress) with goals of Mod I. Patient and granddaughter in agreement with pursuing CIR admission when patient ready for discharge from hospital. She and patient's brother will be able to provide 24/7 supervision/assistance. Will continue to follow and plan to admit in next 1-2 days when patient ready.   Rehab Admissons Coordinator [place], [place], [place] [phone]

## 2022-11-29 NOTE — Progress Notes (Signed)
Physical Therapy Treatment Patient Details Name: Maurice Munoz MRN: [id number] DOB: 1945/07/11 Today's Date: 11/29/2022   History of Present Illness 77 y/o M admitted to [facility] on 6/7 after a fall and loss of consciousness. CTA showed complete occlusion of left cervical ICA, CTA chest negative for PE but with possible volume overload. X-rays of R elbow and B hands negative for acute injury. PMHx: CAD s/p PCI, interstitial pulmonary fibrosis, right heart failure, HTN, HLD.    PT Comments    Pt greeted up in chair on arrival and agreeable to session with progress towards acute goals. Pt able to come to standing with min A to power up from low recliner wit pt demonstrating good hand placement on rise and descent. Pt unable to progress gait this session due to continued pain in R knee and BP drop (see general comments below) with positional changes. Pt able to march in place in standing with min A to maintain dynamic standing balance and Rw for support. Current plan remains appropriate to address deficits and maximize functional independence and decrease caregiver burden. Pt continues to benefit from skilled PT services to progress toward functional mobility goals.    Recommendations for follow up therapy are one component of a multi-disciplinary discharge planning process, led by the attending physician.  Recommendations may be updated based on patient status, additional functional criteria and insurance authorization.  Follow Up Recommendations       Assistance Recommended at Discharge Frequent or constant Supervision/Assistance  Patient can return home with the following Two people to help with walking and/or transfers;Assistance with cooking/housework;Assist for transportation;Help with stairs or ramp for entrance   Equipment Recommendations  None recommended by PT    Recommendations for Other Services       Precautions / Restrictions Precautions Precautions: Fall;Other  (comment) Precaution Comments: watch O2 and BP Restrictions Weight Bearing Restrictions: No     Mobility  Bed Mobility Overal bed mobility: Needs Assistance             General bed mobility comments: pt up in chair on arrival    Transfers Overall transfer level: Needs assistance Equipment used: Rolling walker (2 wheels) Transfers: Sit to/from Stand Sit to Stand: Min assist           General transfer comment: min A to power up from recliner with pt needing increased time to eleavte trunk to full upright standing    Ambulation/Gait             Pre-gait activities: standing marching x10 General Gait Details: not attempted today due to BP   Stairs             Wheelchair Mobility    Modified Rankin (Stroke Patients Only)       Balance Overall balance assessment: Needs assistance Sitting-balance support: Bilateral upper extremity supported, Feet supported Sitting balance-Leahy Scale: Fair     Standing balance support: Bilateral upper extremity supported, During functional activity Standing balance-Leahy Scale: Poor Standing balance comment: reliant on UE support with static standing                            Cognition Arousal/Alertness: Awake/alert Behavior During Therapy: WFL for tasks assessed/performed Overall Cognitive Status: Within Functional Limits for tasks assessed                                 General Comments:  A&Ox4, pleasant throughout session        Exercises Other Exercises Other Exercises: seated marching with arm pumps x40    General Comments General comments (skin integrity, edema, etc.): BP sitting: 100/57, in standing 89/58, with feet elevated and chair reclined 135/72, standing after marching in sitting 93/49 pt asymptomatic throughout, SpO2 >90% on 3L O2      Pertinent Vitals/Pain Pain Assessment Pain Assessment: Faces Faces Pain Scale: Hurts little more Pain Location: B knees Pain  Descriptors / Indicators: Sore Pain Intervention(s): Monitored during session, Limited activity within patient's tolerance    Home Living                          Prior Function            PT Goals (current goals can now be found in the care plan section) Acute Rehab PT Goals Patient Stated Goal: get oxygen levels better and go home PT Goal Formulation: With patient Time For Goal Achievement: 12/11/22 Progress towards PT goals: Progressing toward goals    Frequency    Min 4X/week      PT Plan      Co-evaluation              AM-PAC PT "6 Clicks" Mobility   Outcome Measure  Help needed turning from your back to your side while in a flat bed without using bedrails?: A Little Help needed moving from lying on your back to sitting on the side of a flat bed without using bedrails?: A Little Help needed moving to and from a bed to a chair (including a wheelchair)?: A Lot Help needed standing up from a chair using your arms (e.g., wheelchair or bedside chair)?: A Little Help needed to walk in hospital room?: A Lot Help needed climbing 3-5 steps with a railing? : Total 6 Click Score: 14    End of Session Equipment Utilized During Treatment: Oxygen Activity Tolerance: Patient limited by fatigue;Patient limited by pain Patient left: in chair;with call bell/phone within reach;with chair alarm set;with family/visitor present Nurse Communication: Mobility status PT Visit Diagnosis: Unsteadiness on feet (R26.81);History of falling (Z91.81);Muscle weakness (generalized) (M62.81);Other abnormalities of gait and mobility (R26.89);Difficulty in walking, not elsewhere classified (R26.2)     Time: 1610-9604 PT Time Calculation (min) (ACUTE ONLY): 25 min  Charges:  $Therapeutic Activity: 23-37 mins                     [name] R. PTA Acute Rehabilitation Services Office: [phone]   [name] 11/29/2022, 12:32 PM

## 2022-11-29 NOTE — Progress Notes (Signed)
PROGRESS NOTE                                                                                                                                                                                                             Patient Demographics:    Maurice Munoz, is a 77 y.o. male, DOB - [birth year], ZOX:[id number]  Outpatient Primary MD for the patient is [name], MD    LOS - 3  Admit date - 11/26/2022    Chief Complaint  Patient presents with   Loss of Consciousness   Fall       Brief Narrative (HPI from H&P)   77 y.o. male, with past medical history of coronary artery disease, status post PCI, interstitial pulmonary fibrosis, with his right heart failure, hypertension, hyperlipidemia.  -Patient presents today secondary to syncope x2, patient reports he lives at home by himself, reports he was outside getting ready to go to the doctor's appointment, he felt lightheaded, he sat down, where he had unwitnessed syncopal event, reports when he woke up he was laying on the as felt, where he had multiple durations and bruises, family found him on the ground, they tried to set him up, where he had another syncopal events, reports he was out for 15 minutes, per EMS he was initially hypotensive in the 70s, where he responded to fluid bolus, blood sugar was 131, he is on home oxygen at baseline, but he was noted to be significantly hypoxic on his home oxygen requirement.  In ED patient with profound hypoxia on home oxygen 3 L, currently requiring nonrebreather and salter together, MRI obtained as part of syncope workup, there was a concern for carotid artery stenosis, discussed with neurology who recommended CTA head and neck, it was significant for complete occlusion of left cervical ICA, and 55% stenosis of the proximal right cervical ICA (Dr. Darlyn Read reported given his total occlusion, workup is as outpatient), CTA chest  negative for PE,  but with possible volume overload, [facility] hospitalist consulted to admit at [facility], ER, he was subsequently transferred to [facility] for further care.   Subjective:   Patient in bed, appears comfortable, denies any headache, no fever, no chest pain or pressure, improvement in shortness of breath , no abdominal pain. No focal weakness.   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  EF 60% with echo evidence of increased RV pressure and reduced RV systolic function all expected with advanced underlying IPF, no PE on CTA.   Pulmonary is following the patient, he has been placed on IV steroids along with supportive care with nebulizer treatments and additional oxygen, he uses 5 L at baseline, case discussed with pulmonary on 11/27/2022, some evidence of fluid overload as well responding well to diuretics, continue IV Lasix on 11/29/2022 as tolerated by blood pressure, echocardiogram stable as showing as expected increased right-sided RV overload with reduced RV function, respiratory viral panel negative. Added I-S and flutter valve for pulmonary toiletry.   Syncope.  Likely due to hypoxia.  MRI brain nonacute. Fall at home with multiple skin tears and soft tissue injuries, PT OT to continue, may require placement.  Although he himself is eager to go home.  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  Per neurology Case was discussed upon admission outpatient follow-up.  Continue aspirin and statin for secondary prevention also on Plavix.  Avoid hypotension as cerebral perfusion can be compromised easily.  Dyslipidemia.  On statin.  CKDstage IIIb.  Mild AKI.  Currently on diuretics monitor cautiously.  Avoid nephrotoxins.  CAD.  No acute issues.  On DAPT and statin for secondary prevention.  Monitor.  Hypertension.  Blood  pressure currently soft.  Blood pressure medications on hold.       Condition - Extremely Guarded  Family Communication  :  None  Code Status :  Full  Consults  :  PCCM  PUD Prophylaxis :  PPI   Procedures  :     TTE - 1. LV poorly visualized. Grossly LV function appears to be normal, roughly 55-60%. Left ventricular ejection fraction, by estimation, is 55 to 60%. The left ventricle has normal function. Left ventricular endocardial border not optimally defined to evaluate regional wall motion. Left ventricular diastolic parameters are indeterminate.  2. Ventricular septum is flattened in diastole suggesting RV pressure overload. . Right ventricular systolic function moderately to severely reduced. The right ventricular size is moderately to severely dilated. Tricuspid regurgitation signal is inadequate for assessing PA pressure.  3. Right atrial size was severely dilated.  4. The mitral valve was not well visualized. No evidence of mitral valve regurgitation. No evidence of mitral stenosis.  5. The aortic valve was not well visualized. Aortic valve regurgitation is not visualized. No aortic stenosis is present.  6. The inferior vena cava is normal in size with greater than 50% respiratory variability, suggesting right atrial pressure of 3 mmHg.  MRI - 1. No acute infarct or hemorrhage. Loss of the left ICA flow void. Recommend further characterization with CTA or contrast-enhanced MRA of the head and neck. 2. 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 2. 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 3. No intracranial large vessel occlusion or high-grade stenosis. Aortic Atherosclerosis   CTA Chest - 1. No evidence of significant pulmonary embolus. 2. Cardiac enlargement with right heart prominence suggesting right heart failure. 3. New finding of  bilateral pleural effusions with airspace and interstitial disease throughout the lungs likely indicating pulmonary edema. Multifocal pneumonia would be a less likely possibility.  4. Chronic underlying interstitial lung disease with diffuse peripheral distribution of pulmonary fibrosis and severe honeycomb changes. 5. Mediastinal and right hilar lymphadenopathy is increasing since prior study. Etiology is nonspecific, possibly reactive, neoplastic, or lymphoma. 6. Cholelithiasis with suggestion of gallbladder wall thickening. Consider ultrasound if there is clinical concern for acute cholecystitis. 7. Aortic atherosclerosis      Disposition Plan  :    Status is: Inpatient  DVT Prophylaxis  :    heparin injection 5,000 Units Start: 11/27/22 1400    Lab Results  Component Value Date   PLT 127 (L) 11/29/2022    Diet :  Diet Order             Diet Heart Room service appropriate? Yes; Fluid consistency: Thin; Fluid restriction: 1200 mL Fluid  Diet effective now                    Inpatient Medications  Scheduled Meds:  allopurinol  300 mg Oral q AM   aspirin EC  81 mg Oral q AM   atorvastatin  80 mg Oral Daily   cholecalciferol  5,000 Units Oral q AM   clopidogrel  75 mg Oral Daily   cyanocobalamin  1,000 mcg Oral q AM   heparin  5,000 Units Subcutaneous Q8H   isosorbide mononitrate  30 mg Oral q morning   methylPREDNISolone (SOLU-MEDROL) injection  40 mg Intravenous Q12H   metoprolol tartrate  25 mg Oral BID   pantoprazole  40 mg Oral Daily   Pirfenidone  1 tablet Oral TID WC   tiZANidine  4 mg Oral QHS   Continuous Infusions:  azithromycin 500 mg (11/28/22 2104)   cefTRIAXone (ROCEPHIN)  IV 2 g (11/28/22 2016)   PRN Meds:.acetaminophen, albuterol, benzonatate, diltiazem, hydrALAZINE, nitroGLYCERIN    Objective:   Vitals:   11/28/22 2107 11/29/22 0011 11/29/22 0413 11/29/22 0414  BP: 124/63 112/66 118/62   Pulse: 77 63 60   Resp:  13 13   Temp:  97.9 F (36.6  C) 97.9 F (36.6 C)   TempSrc:  Oral Oral   SpO2:      Weight:    86.5 kg  Height:        Wt Readings from Last 3 Encounters:  11/29/22 86.5 kg  10/18/22 83 kg  09/28/22 83 kg     Intake/Output Summary (Last 24 hours) at 11/29/2022 0827 Last data filed at 11/29/2022 0824 Gross per 24 hour  Intake --  Output 2600 ml  Net -2600 ml     Physical Exam  Awake Alert, No new F.N deficits, Normal affect [place].AT,PERRAL Supple Neck, No JVD,   Symmetrical Chest wall movement, Good air movement bilaterally, fine rales RRR,No Gallops,Rubs or new Murmurs,  +ve B.Sounds, Abd Soft, No tenderness,   No Cyanosis, Clubbing or edema       Data Review:    Recent Labs  Lab 11/26/22 0907 11/27/22 0608 11/28/22 0410 11/29/22 0420  WBC 7.0 17.7* 13.0* 12.8*  HGB 14.4 15.7 12.6* 13.0  HCT 45.1 49.1 38.9* 39.5  PLT 197 188 119* 127*  MCV 96.2 93.5 94.0 95.2  MCH 30.7 29.9 30.4 31.3  MCHC 31.9 32.0 32.4 32.9  RDW 14.8 14.7 14.5 14.6  LYMPHSABS 0.9  --  1.0 0.8  MONOABS 0.4  --  1.2* 0.7  EOSABS 0.1  --  0.0 0.0  BASOSABS 0.0  --  0.0 0.0    Recent Labs  Lab 11/26/22 0907 11/27/22 0430 11/27/22  1610 11/28/22 0410 11/29/22 0420  NA 138  --  137 136 136  K 4.3  --  5.0 4.6 4.3  CL 103  --  100 103 97*  CO2 26  --  26 27 31   ANIONGAP 9  --  11 6 8   GLUCOSE 117*  --  123* 125* 131*  BUN 30*  --  32* 39* 40*  CREATININE 1.44*  --  1.69* 1.29* 1.33*  CRP  --   --  2.2* 2.5* 1.3*  PROCALCITON  --   --  <0.10 <0.10 <0.10  BNP 975.0* 2,446.0*  --  1,318.6* 1,203.5*  MG  --   --  2.1 2.1 2.0  CALCIUM 8.7*  --  9.1 8.5* 8.6*      Recent Labs  Lab 11/26/22 0907 11/27/22 0430 11/27/22 0608 11/28/22 0410 11/29/22 0420  CRP  --   --  2.2* 2.5* 1.3*  PROCALCITON  --   --  <0.10 <0.10 <0.10  BNP 975.0* 2,446.0*  --  1,318.6* 1,203.5*  MG  --   --  2.1 2.1 2.0  CALCIUM 8.7*  --  9.1 8.5* 8.6*      Radiology Reports ECHOCARDIOGRAM COMPLETE  Result Date: 11/28/2022     ECHOCARDIOGRAM REPORT   Patient Name:   Maurice Munoz Date of Exam: 11/28/2022 Medical Rec #:  [id number]        Height:       73.0 in Accession #:    [id number]       Weight:       191.6 lb Date of Birth:  Oct 23, 1945        BSA:          2.113 m Patient Age:    76 years         BP:           125/74 mmHg Patient Gender: M                HR:           67 bpm. Exam Location:  Inpatient Procedure: 2D Echo, Color Doppler and Cardiac Doppler Indications:    Pulmonary hypertension I27.2  History:        Patient has prior history of Echocardiogram examinations, most                 recent 06/30/2022. CAD and Previous Myocardial Infarction, Prior                 Cardiac Surgery, CKD and Pulmonary HTN; Risk                 Factors:Hypertension, Former Smoker and Dyslipidemia.  Sonographer:    Dondra Prader RVT RCS Referring Phys: [id number] DAWOOD S ELGERGAWY  Sonographer Comments: Technically challenging study due to limited acoustic windows, Technically difficult study due to poor echo windows, suboptimal parasternal window, suboptimal apical window and suboptimal subcostal window. Definity deemed inappropriate due to limited to no windows. Patient unable to reposition IMPRESSIONS  1. LV poorly visualized. Grossly LV function appears to be normal, roughly 55-60%. . Left ventricular ejection fraction, by estimation, is 55 to 60%. The left ventricle has normal function. Left ventricular endocardial border not optimally defined to evaluate regional wall motion. Left ventricular diastolic parameters are indeterminate.  2. Ventricular septum is flattened in diastole suggesting RV pressure overload. . Right ventricular systolic function moderately to severely reduced. The right ventricular size is moderately to severely dilated. Tricuspid regurgitation signal is  inadequate for assessing PA pressure.  3. Right atrial size was severely dilated.  4. The mitral valve was not well visualized. No evidence of mitral valve regurgitation. No  evidence of mitral stenosis.  5. The aortic valve was not well visualized. Aortic valve regurgitation is not visualized. No aortic stenosis is present.  6. The inferior vena cava is normal in size with greater than 50% respiratory variability, suggesting right atrial pressure of 3 mmHg. FINDINGS  Left Ventricle: LV poorly visualized. Grossly LV function appears to be normal, roughly 55-60%. Left ventricular ejection fraction, by estimation, is 55 to 60%. The left ventricle has normal function. Left ventricular endocardial border not optimally defined to evaluate regional wall motion. The left ventricular internal cavity size was normal in size. There is no left ventricular hypertrophy. Left ventricular diastolic parameters are indeterminate. Right Ventricle: Ventricular septum is flattened in diastole suggesting RV pressure overload. The right ventricular size is moderately to severely dilated. Right vetricular wall thickness was not well visualized. Right ventricular systolic function moderately to severely reduced. Tricuspid regurgitation signal is inadequate for assessing PA pressure. Left Atrium: Left atrial size was not well visualized. Right Atrium: Right atrial size was severely dilated. Pericardium: There is no evidence of pericardial effusion. Mitral Valve: The mitral valve was not well visualized. No evidence of mitral valve regurgitation. No evidence of mitral valve stenosis. Tricuspid Valve: The tricuspid valve is not well visualized. Tricuspid valve regurgitation is trivial. No evidence of tricuspid stenosis. Aortic Valve: The aortic valve was not well visualized. Aortic valve regurgitation is not visualized. No aortic stenosis is present. Aortic valve mean gradient measures 2.0 mmHg. Aortic valve peak gradient measures 3.6 mmHg. Pulmonic Valve: The pulmonic valve was not well visualized. Pulmonic valve regurgitation is not visualized. No evidence of pulmonic stenosis. Aorta: The aortic root is normal  in size and structure. Venous: The inferior vena cava is normal in size with greater than 50% respiratory variability, suggesting right atrial pressure of 3 mmHg. IAS/Shunts: No atrial level shunt detected by color flow Doppler.  LEFT VENTRICLE PLAX 2D LVIDd:         3.80 cm LVIDs:         2.80 cm LV PW:         1.00 cm LV IVS:        0.80 cm  RIGHT VENTRICLE            IVC RV Basal diam:  4.90 cm    IVC diam: 1.60 cm RV S prime:     6.18 cm/s LEFT ATRIUM           Index        RIGHT ATRIUM           Index LA diam:      3.40 cm 1.61 cm/m   RA Area:     27.10 cm LA Vol (A4C): 43.4 ml 20.54 ml/m  RA Volume:   97.90 ml  46.34 ml/m  AORTIC VALVE                   PULMONIC VALVE AV Vmax:           94.30 cm/s  PV Vmax:       0.71 m/s AV Vmean:          62.900 cm/s PV Peak grad:  2.0 mmHg AV VTI:            0.202 m AV Peak Grad:      3.6  mmHg AV Mean Grad:      2.0 mmHg LVOT Vmax:         72.20 cm/s LVOT Vmean:        44.700 cm/s LVOT VTI:          0.154 m LVOT/AV VTI ratio: 0.76  AORTA Ao Root diam: 3.00 cm MITRAL VALVE MV Area (PHT): 3.76 cm    SHUNTS MV Decel Time: 202 msec    Systemic VTI: 0.15 m MV E velocity: 50.50 cm/s MV A velocity: 48.40 cm/s MV E/A ratio:  1.04 Dina Rich MD Electronically signed by Dina Rich MD Signature Date/Time: 11/28/2022/12:58:19 PM    Final    DG Chest Port 1 View  Result Date: 11/28/2022 CLINICAL DATA:  Shortness of breath EXAM: PORTABLE CHEST 1 VIEW COMPARISON:  11/27/2022 FINDINGS: Cardiac shadow is stable. Lungs are well aerated bilaterally. Diffuse chronic interstitial changes are again seen bilaterally. No new focal infiltrate is seen. Postsurgical changes in the cervical spine are noted. IMPRESSION: Chronic interstitial changes without acute abnormality. Electronically Signed   By: Alcide Clever M.D.   On: 11/28/2022 10:04   DG Chest Port 1 View  Result Date: 11/27/2022 CLINICAL DATA:  Shortness of breath. EXAM: PORTABLE CHEST 1 VIEW COMPARISON:  CT 11/26/2022  FINDINGS: Stable cardiomediastinal contours. Unchanged small bilateral pleural effusions. Severe changes of chronic interstitial lung disease is again seen throughout both lungs with diffuse interstitial reticulation, architectural distortion and scarring. Diffuse ground-glass attenuation and patchy airspace opacification are unchanged from the previous CT. IMPRESSION: 1. No change in aeration to the lungs compared with previous CT. 2. Severe changes of chronic interstitial lung disease. Electronically Signed   By: Signa Kell M.D.   On: 11/27/2022 07:58   CT Angio Chest PE W/Cm &/Or Wo Cm  Result Date: 11/26/2022 CLINICAL DATA:  Pulmonary embolus suspected with high probability. Syncope and fall. Dizziness. EXAM: CT ANGIOGRAPHY CHEST WITH CONTRAST TECHNIQUE: Multidetector CT imaging of the chest was performed using the standard protocol during bolus administration of intravenous contrast. Multiplanar CT image reconstructions and MIPs were obtained to evaluate the vascular anatomy. RADIATION DOSE REDUCTION: This exam was performed according to the departmental dose-optimization program which includes automated exposure control, adjustment of the mA and/or kV according to patient size and/or use of iterative reconstruction technique. CONTRAST:  OMNIPAQUE IOHEXOL 350 MG/ML SOLN COMPARISON:  08/31/2022 FINDINGS: Cardiovascular: Technically adequate study with good opacification of the central and segmental pulmonary arteries. No focal filling defects. No evidence of significant pulmonary embolus. Cardiac enlargement with right heart predominance. No pericardial effusions. Normal caliber thoracic aorta. No aortic dissection. Calcification in the aorta and coronary arteries. Mediastinum/Nodes: Thyroid gland is unremarkable. Esophagus is decompressed. Mediastinal lymphadenopathy and right hilar lymphadenopathy. Largest left aortopulmonic window nodes measure 1.9 cm short axis dimension. Lymph nodes are  enlarged since the prior study. Lungs/Pleura: Severe interstitial infiltrates throughout the lungs with a mostly peripheral and basilar distribution. Appearances are most consistent with usual interstitial pneumonitis. Severe honeycomb changes particularly in the lung bases. These findings are similar to the prior study. However there is interval development of small bilateral pleural effusions as well as diffuse airspace and interstitial infiltration in the aerated portions of the lungs. Changes most likely represent developing pulmonary edema although multifocal pneumonia could also have this appearance. [place] in the right apex. Upper Abdomen: Cholelithiasis with single stone in the gallbladder neck. Gallbladder wall appears mildly thickened, possibly indicating acute cholecystitis if in the appropriate clinical setting. Consider ultrasound for  further evaluation if clinically indicated. Otherwise no acute changes demonstrated in the upper abdomen. Musculoskeletal: Degenerative changes in the spine. Postoperative changes in the cervical spine. Review of the MIP images confirms the above findings. IMPRESSION: 1. No evidence of significant pulmonary embolus. 2. Cardiac enlargement with right heart prominence suggesting right heart failure. 3. New finding of bilateral pleural effusions with airspace and interstitial disease throughout the lungs likely indicating pulmonary edema. Multifocal pneumonia would be a less likely possibility. 4. Chronic underlying interstitial lung disease with diffuse peripheral distribution of pulmonary fibrosis and severe honeycomb changes. 5. Mediastinal and right hilar lymphadenopathy is increasing since prior study. Etiology is nonspecific, possibly reactive, neoplastic, or lymphoma. 6. Cholelithiasis with suggestion of gallbladder wall thickening. Consider ultrasound if there is clinical concern for acute cholecystitis. 7. Aortic atherosclerosis. Electronically Signed   By: Burman Nieves M.D.   On: 11/26/2022 16:14   CT ANGIO HEAD NECK W WO CM  Result Date: 11/26/2022 CLINICAL DATA:  Neuro deficit, acute, stroke suspected. EXAM: CT ANGIOGRAPHY HEAD AND NECK WITH AND WITHOUT CONTRAST TECHNIQUE: Multidetector CT imaging of the head and neck was performed using the standard protocol during bolus administration of intravenous contrast. Multiplanar CT image reconstructions and MIPs were obtained to evaluate the vascular anatomy. Carotid stenosis measurements (when applicable) are obtained utilizing NASCET criteria, using the distal internal carotid diameter as the denominator. RADIATION DOSE REDUCTION: This exam was performed according to the departmental dose-optimization program which includes automated exposure control, adjustment of the mA and/or kV according to patient size and/or use of iterative reconstruction technique. CONTRAST:  OMNIPAQUE IOHEXOL 350 MG/ML SOLN COMPARISON:  MRI brain 11/26/2022. FINDINGS: CTA NECK FINDINGS Aortic arch: Three-vessel arch configuration. Atherosclerotic calcifications of the aortic arch and arch vessel origins. Arch vessel origins are patent. Right carotid system: Predominantly calcified plaque results in 55% stenosis of the proximal right cervical ICA. 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 Reconstitution at the communicating segment. Vertebral arteries: Codominant. No evidence of dissection, stenosis (50% or greater), or occlusion. Skeleton: Prior C3-C7 ACDF and C2-3 posterior spinal fusion. No suspicious bone lesions. Other neck: Unremarkable. Upper chest: Fibrosis and edema with apical bullae. Attention on same day CTA chest report. Review of the MIP images confirms the above findings CTA HEAD FINDINGS Anterior circulation: Reconstitution of the left ICA at the communicating segment. Right ICA is patent. The proximal ACAs and MCAs are patent without stenosis or aneurysm. Distal  branches are symmetric. Posterior circulation: Normal basilar artery. The SCAs, AICAs and PICAs are patent proximally. The PCAs are patent proximally without stenosis or aneurysm. Distal branches are symmetric. Venous sinuses: Patent. Anatomic variants: None. Review of the MIP images confirms the above findings IMPRESSION: 1. 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 2. 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 3. No intracranial large vessel occlusion or high-grade stenosis. Aortic Atherosclerosis (ICD10-I70.0). Electronically Signed   By: Orvan Falconer M.D.   On: 11/26/2022 16:10   MR BRAIN WO CONTRAST  Result Date: 11/26/2022 CLINICAL DATA:  Mental status change, unknown cause. EXAM: MRI HEAD WITHOUT CONTRAST TECHNIQUE: Multiplanar, multiecho pulse sequences of the brain and surrounding structures were obtained without intravenous contrast. COMPARISON:  Head CT 11/26/2022. FINDINGS: Brain: No acute infarct or hemorrhage. 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 No hydrocephalus or extra-axial collection. No mass or midline shift. No foci of abnormal susceptibility.  Vascular: Loss of the left ICA flow void. Skull and upper cervical spine: Partially visualized susceptibility artifact from prior cervical spinal fusion. Otherwise normal marrow signal. Sinuses/Orbits: Unremarkable. Other: None. IMPRESSION: 1. No acute infarct or hemorrhage. Loss of the left ICA flow void. Recommend further characterization with CTA or contrast-enhanced MRA of the head and neck. 2. 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 Electronically Signed   By: Orvan Falconer M.D.   On: 11/26/2022 13:05   DG Hand Complete Left  Result Date: 11/26/2022 CLINICAL DATA:  Fall with left hand pain. EXAM: LEFT HAND - COMPLETE 3+ VIEW COMPARISON:  None Available. FINDINGS: Bone mineralization is normal. Alignment is normal. No acute fracture or dislocation.  Punctate radiodensity over the soft tissues adjacent the fourth D IP joint which may represent a tiny foreign body. Mild degenerate changes over the radiocarpal joint, radial side of the carpal bones, interphalangeal joints as well as the first through third MCP joints. Old ulnar styloid fracture. IMPRESSION: 1. No acute findings. 2. Mild degenerative changes. 3. Possible tiny foreign body over the soft tissues adjacent the fourth DIP joint. Electronically Signed   By: Elberta Fortis M.D.   On: 11/26/2022 10:37   CT HEAD WO CONTRAST  Result Date: 11/26/2022 CLINICAL DATA:  Neck trauma EXAM: CT HEAD WITHOUT CONTRAST CT CERVICAL SPINE WITHOUT CONTRAST TECHNIQUE: Multidetector CT imaging of the head and cervical spine was performed following the standard protocol without intravenous contrast. Multiplanar CT image reconstructions of the cervical spine were also generated. RADIATION DOSE REDUCTION: This exam was performed according to the departmental dose-optimization program which includes automated exposure control, adjustment of the mA and/or kV according to patient size and/or use of iterative reconstruction technique. COMPARISON:  CT C Spine 02/24/10 FINDINGS: CT HEAD FINDINGS Brain: Subtle hypodensity in the anterior left frontal lobe (series 2, image 19) could represent an acute infarct or post-traumatic change. No evidence of hemorrhage, hydrocephalus, extra-axial collection or mass lesion/mass effect. Vascular: No hyperdense vessel or unexpected calcification. Skull: Soft tissue hematoma along the frontal scalp. No evidence calvarial fracture. Sinuses/Orbits: No acute finding. Other: None. CT CERVICAL SPINE FINDINGS Alignment: Straightening of the normal cervical lordosis. Skull base and vertebrae: Status post C3-C7 ACDF and C2-C3 posterior fusion. No acute fracture. No primary bone lesion or focal pathologic process. Soft tissues and spinal canal: No prevertebral fluid or swelling. No visible canal hematoma.  Disc levels: Multilevel moderate spinal canal narrowing at C3-C7. Upper chest: Bullous change at the right lung apex. Other: None IMPRESSION: 1. Subtle cortical hypodensity in the anterior left frontal lobe could represent an acute infarct or post-traumatic change. Brain MRI could be considered if this is a clinical concern. 2. Soft tissue hematoma along the frontal scalp. No evidence of calvarial fracture. 3. No acute cervical spine fracture. Electronically Signed   By: Lorenza Cambridge M.D.   On: 11/26/2022 10:29   CT CERVICAL SPINE WO CONTRAST  Result Date: 11/26/2022 CLINICAL DATA:  Neck trauma EXAM: CT HEAD WITHOUT CONTRAST CT CERVICAL SPINE WITHOUT CONTRAST TECHNIQUE: Multidetector CT imaging of the head and cervical spine was performed following the standard protocol without intravenous contrast. Multiplanar CT image reconstructions of the cervical spine were also generated. RADIATION DOSE REDUCTION: This exam was performed according to the departmental dose-optimization program which includes automated exposure control, adjustment of the mA and/or kV according to patient size and/or use of iterative reconstruction technique. COMPARISON:  CT C Spine 02/24/10 FINDINGS: CT HEAD FINDINGS Brain: Subtle hypodensity in the anterior  left frontal lobe (series 2, image 19) could represent an acute infarct or post-traumatic change. No evidence of hemorrhage, hydrocephalus, extra-axial collection or mass lesion/mass effect. Vascular: No hyperdense vessel or unexpected calcification. Skull: Soft tissue hematoma along the frontal scalp. No evidence calvarial fracture. Sinuses/Orbits: No acute finding. Other: None. CT CERVICAL SPINE FINDINGS Alignment: Straightening of the normal cervical lordosis. Skull base and vertebrae: Status post C3-C7 ACDF and C2-C3 posterior fusion. No acute fracture. No primary bone lesion or focal pathologic process. Soft tissues and spinal canal: No prevertebral fluid or swelling. No visible canal  hematoma. Disc levels: Multilevel moderate spinal canal narrowing at C3-C7. Upper chest: Bullous change at the right lung apex. Other: None IMPRESSION: 1. Subtle cortical hypodensity in the anterior left frontal lobe could represent an acute infarct or post-traumatic change. Brain MRI could be considered if this is a clinical concern. 2. Soft tissue hematoma along the frontal scalp. No evidence of calvarial fracture. 3. No acute cervical spine fracture. Electronically Signed   By: Lorenza Cambridge M.D.   On: 11/26/2022 10:29   DG Elbow Complete Right  Result Date: 11/26/2022 CLINICAL DATA:  Fall with right elbow pain. EXAM: RIGHT ELBOW - COMPLETE 3+ VIEW COMPARISON:  None Available. 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 No significant joint effusion. No evidence of acute fracture or dislocation. IMPRESSION: 1. No acute findings. 2. 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 Electronically Signed   By: Elberta Fortis M.D.   On: 11/26/2022 10:19   DG Hand Complete Right  Result Date: 11/26/2022 CLINICAL DATA:  Fall with left hand pain. EXAM: RIGHT HAND - COMPLETE 3+ VIEW COMPARISON:  None Available. FINDINGS: Alignment and bone mineralization is normal. There are mild degenerative changes over the radiocarpal joint, carpal bones and interphalangeal joints as well as first carpometacarpal joint. No bony erosions. No acute fracture or dislocation. Punctate radiopaque focus over the soft tissues adjacent the volar/ulnar aspect of the base of the second middle phalanx possibly a small foreign body. IMPRESSION: 1. No acute findings. 2. Mild degenerative changes. 3. Possible punctate foreign body over the soft tissues adjacent the base of the second middle phalanx. Electronically Signed   By: Elberta Fortis M.D.   On: 11/26/2022 10:17      Signature  -   Susa Raring M.D on 11/29/2022 at 8:27 AM   -  To page go to [email or web address]

## 2022-11-29 NOTE — PMR Pre-admission (Signed)
PMR Admission Coordinator Pre-Admission Assessment  Patient: Maurice Munoz is an 77 y.o., male MRN: [id number] DOB: May 08, 1946 Height: 6\' 1"  (185.4 cm) Weight: 86.5 kg  Insurance Information HMO: ***    PPO: ***     PCP: ***     IPA: ***     80/20: ***     OTHER: *** PRIMARY: Medicare A & B      Policy#: [id number]      Subscriber: patient CM Name:       Phone#: ***     Fax#: *** Pre-Cert#: ***      Employer: *** Benefits:  Phone #: ***     Name: *** Eff. Date: 11/29/22     Deduct: $2956      CIR: 100%      SNF: 20 full days Outpatient: no benefit      Home Health: 100%       DME: no benefit      Providers:  SECONDARYSusa Simmonds       Policy#: [id number]     Phone#: [phone]  Financial Counselor:       Phone#:   The "Data Collection Information Summary" for patients in Inpatient Rehabilitation Facilities with attached "Privacy Act Statement-Health Care Records" was provided and verbally reviewed with: Patient  Emergency Contact Information Contact Information     Name Relation Home Work Mobile   Scribner Granddaughter [phone]  [phone]       Current Medical History  Patient Admitting Diagnosis: Hypoxia, syncope, fall History of Present Illness: Maurice Munoz  is a 77 y.o. male, with past medical history of coronary artery disease, status post PCI, interstitial pulmonary fibrosis, with his right heart failure, hypertension, hyperlipidemia. Patient presents today secondary to syncope x2, patient reports he lives at home by himself, reports he was outside getting ready to go to the doctor's appointment, he felt lightheaded, he sat down on tractor, where he had unwitnessed syncopal event, reports when he woke up he was laying on the asphalt, where he had multiple lacerations and bruises, family found him on the ground, they tried to set him up, where he had another syncopal events, reports he was out for 15 minutes, per EMS he was initially hypotensive in the 70s,  where he responded to fluid bolus, blood sugar was 131, he is on home oxygen at baseline, but he was noted to be significantly hypoxic on his home oxygen requirement. Presented to [facility] ED on 11/26/22 with profound hypoxia on home oxygen 3 L, currently requiring nonrebreather and salter together, MRI obtained as part of syncope workup, there was a concern for carotid artery stenosis, discussed with neurology who recommended CTA head and neck, it was significant for complete occlusion of left cervical ICA, and 55% stenosis of the proximal right cervical ICA (Dr. Darlyn Read reported given his total occlusion, workup is as outpatient), CTA chest  negative for PE, but with possible volume overload, [facility] hospitalist consulted to admit. Patient required 15 L salter and nonrebreather on admission. ECHO stable as showing as expected increased right-sided RV overload with reduced RV function, respiratory viral panel negative. MRI brain non acute, Fall at home with multiple skin tears and soft tissue injuries, blood pressure currently soft. Blood pressure medications on hold.  Therapies are recommending intensive rehab program.     Patient's medical record from [facility] has been reviewed by the rehabilitation admission coordinator and physician.  Past Medical History  Past Medical History:  Diagnosis Date   Adrenal adenoma,  left    small   Aortic atherosclerosis (HCC)    Back pain    Bilateral carotid artery stenosis followed by dr Tresa Endo   per last carotid duplex 07-17-2014  -- proximal LICA 50-69% ,  RICA 0-49%   CAD (coronary artery disease)    a. s/p prior intervention to RCA and PDA in 1999 b. DES to LAD and distal RCA in 2007   Cardiomegaly    Stable   Chronic constipation    Chronic pain syndrome    CKD (chronic kidney disease), stage III (HCC)    DDD (degenerative disc disease), cervical    DDD (degenerative disc disease), lumbosacral    Dyspnea    Dyspnea on exertion    Gout    per pt last  inflared episode 2015 approx.   Grade II diastolic dysfunction    H/O epistaxis    per pt sees ENT dr Suszanne Conners for cauterization (pt takes plavix)   History of acute pancreatitis 08/08/2014   History of kidney stones    HTN (hypertension)    Hyperlipidemia    Interstitial lung disease (HCC)    Left arm pain    s/p fall   Nocturia    Numbness of right foot    due to DDD lumbar   Peripheral edema    Pilonidal cyst    Pleural effusion on right    Pulmonary fibrosis (HCC)    followed by pcp-- last chest CT 04-08-2016   S/P drug eluting coronary stent placement 10/11/2005   mLAD and dRCA   Wears dentures    upper   Wears glasses     Has the patient had major surgery during 100 days prior to admission? No  Family History   family history includes Cancer in his father; Diabetes in his brother; Heart attack in his brother.  Current Medications  Current Facility-Administered Medications:    acetaminophen (TYLENOL) tablet 650 mg, 650 mg, Oral, Q6H PRN, Elgergawy, Leana Roe, MD, 650 mg at 11/29/22 0942   albuterol (PROVENTIL) (2.5 MG/3ML) 0.083% nebulizer solution 2.5 mg, 2.5 mg, Nebulization, Q2H PRN, Elgergawy, Leana Roe, MD   allopurinol (ZYLOPRIM) tablet 300 mg, 300 mg, Oral, q AM, Elgergawy, Leana Roe, MD, 300 mg at 11/29/22 0636   aspirin EC tablet 81 mg, 81 mg, Oral, q AM, Elgergawy, Leana Roe, MD, 81 mg at 11/29/22 0636   atorvastatin (LIPITOR) tablet 80 mg, 80 mg, Oral, Daily, Elgergawy, Leana Roe, MD, 80 mg at 11/29/22 0819   benzonatate (TESSALON) capsule 100 mg, 100 mg, Oral, Q4H PRN, Leroy Sea, MD, 100 mg at 11/28/22 2105   cholecalciferol (VITAMIN D3) 25 MCG (1000 UNIT) tablet 5,000 Units, 5,000 Units, Oral, q AM, Leroy Sea, MD, 5,000 Units at 11/29/22 0636   clopidogrel (PLAVIX) tablet 75 mg, 75 mg, Oral, Daily, Elgergawy, Leana Roe, MD, 75 mg at 11/29/22 0820   cyanocobalamin (VITAMIN B12) tablet 1,000 mcg, 1,000 mcg, Oral, q AM, Elgergawy, Leana Roe, MD, 1,000 mcg  at 11/29/22 0636   diltiazem (CARDIZEM) injection 10 mg, 10 mg, Intravenous, Q6H PRN, Leroy Sea, MD, 10 mg at 11/27/22 1714   heparin injection 5,000 Units, 5,000 Units, Subcutaneous, Q8H, Elgergawy, Leana Roe, MD, 5,000 Units at 11/29/22 1226   hydrALAZINE (APRESOLINE) injection 5 mg, 5 mg, Intravenous, Q4H PRN, Elgergawy, Leana Roe, MD   isosorbide mononitrate (IMDUR) 24 hr tablet 30 mg, 30 mg, Oral, q morning, Leroy Sea, MD, 30 mg at 11/29/22 0819   methylPREDNISolone  sodium succinate (SOLU-MEDROL) 40 mg/mL injection 40 mg, 40 mg, Intravenous, Q12H, Leroy Sea, MD, 40 mg at 11/29/22 1610   metoprolol tartrate (LOPRESSOR) tablet 25 mg, 25 mg, Oral, BID, Leroy Sea, MD, 25 mg at 11/29/22 0819   morphine (PF) 2 MG/ML injection 1 mg, 1 mg, Intravenous, Q12H PRN, Leroy Sea, MD   nitroGLYCERIN (NITROSTAT) SL tablet 0.4 mg, 0.4 mg, Sublingual, Q5 min PRN, Elgergawy, Leana Roe, MD   pantoprazole (PROTONIX) EC tablet 40 mg, 40 mg, Oral, Daily, Elgergawy, Leana Roe, MD, 40 mg at 11/29/22 9604   Pirfenidone TABS 801 mg, 1 tablet, Oral, TID WC, Elgergawy, Leana Roe, MD, 801 mg at 11/29/22 1226   tiZANidine (ZANAFLEX) tablet 4 mg, 4 mg, Oral, QHS, Elgergawy, Leana Roe, MD, 4 mg at 11/28/22 2105  Patients Current Diet:  Diet Order             Diet Heart Room service appropriate? Yes; Fluid consistency: Thin; Fluid restriction: 1200 mL Fluid  Diet effective now                   Precautions / Restrictions Precautions Precautions: Fall Precaution Comments: watch O2 and BP Restrictions Weight Bearing Restrictions: No   Has the patient had 2 or more falls or a fall with injury in the past year? Yes  Prior Activity Level Community (5-7x/wk): independent with walker or cane  Prior Functional Level Self Care: Did the patient need help bathing, dressing, using the toilet or eating? Independent  Indoor Mobility: Did the patient need assistance with walking from  room to room (with or without device)? Independent  Stairs: Did the patient need assistance with internal or external stairs (with or without device)? Independent  Functional Cognition: Did the patient need help planning regular tasks such as shopping or remembering to take medications? Independent  Patient Information Are you of Hispanic, Latino/a,or Spanish origin?: A. No, not of Hispanic, Latino/a, or Spanish origin What is your race?: A. White Do you need or want an interpreter to communicate with a doctor or health care staff?: 0. No  Patient's Response To:  Health Literacy and Transportation Is the patient able to respond to health literacy and transportation needs?: Yes Health Literacy - How often do you need to have someone help you when you read instructions, pamphlets, or other written material from your doctor or pharmacy?: Never In the past 12 months, has lack of transportation kept you from medical appointments or from getting medications?: No In the past 12 months, has lack of transportation kept you from meetings, work, or from getting things needed for daily living?: No  Journalist, newspaper / Equipment Home Assistive Devices/Equipment: None Home Equipment: Art gallery manager, Agricultural consultant (2 wheels), [employer] - single point, Information systems manager - built in, [employer] - tub/shower, Higher education careers adviser held shower head  Prior Device Use: Indicate devices/aids used by the patient prior to current illness, exacerbation or injury?  Cane, walker  Current Functional Level Cognition  Overall Cognitive Status: Within Functional Limits for tasks assessed Orientation Level: Oriented X4 General Comments: A&Ox4, pleasant throughout session    Extremity Assessment (includes Sensation/Coordination)  Upper Extremity Assessment: Defer to OT evaluation  Lower Extremity Assessment: Generalized weakness (BLE sore, limited resistance applied, reports mild tingling/numbness in B feet that has been ongoing)     ADLs       Mobility  Overal bed mobility: Needs Assistance Bed Mobility: Supine to Sit Supine to sit: Min assist, HOB elevated General  bed mobility comments: pt up in chair on arrival    Transfers  Overall transfer level: Needs assistance Equipment used: Rolling walker (2 wheels) Transfers: Sit to/from Stand Sit to Stand: Min assist Bed to/from chair/wheelchair/BSC transfer type:: Stand pivot Stand pivot transfers: Max assist, From elevated surface General transfer comment: min A to power up from recliner with pt needing increased time to eleavte trunk to full upright standing    Ambulation / Gait / Stairs / Wheelchair Mobility  Ambulation/Gait General Gait Details: not attempted today due to BP Pre-gait activities: standing marching x10    Posture / Balance Balance Overall balance assessment: Needs assistance Sitting-balance support: Bilateral upper extremity supported, Feet supported Sitting balance-Leahy Scale: Fair Standing balance support: Bilateral upper extremity supported, During functional activity Standing balance-Leahy Scale: Poor Standing balance comment: reliant on UE support with static standing    Special needs/care consideration Oxygen *** and Skin ***   Previous Home Environment (from acute therapy documentation) Living Arrangements: Alone Available Help at Discharge: Family, Friend(s), Available 24 hours/day Type of Home: House Home Layout: One level Home Access: Stairs to enter Entrance Stairs-Rails: None Entrance Stairs-Number of Steps: 1 threshold Bathroom Shower/Tub: Health visitor: Handicapped height Bathroom Accessibility: Yes How Accessible: Accessible via walker Home Care Services: No  Discharge Living Setting Plans for Discharge Living Setting: Patient's home Type of Home at Discharge: House Discharge Home Layout: One level Discharge Home Access: Stairs to enter Entrance Stairs-Rails: None Entrance Stairs-Number of  Steps: 1 Discharge Bathroom Shower/Tub: Walk-in shower Discharge Bathroom Toilet: Handicapped height Discharge Bathroom Accessibility: Yes How Accessible: Accessible via walker Does the patient have any problems obtaining your medications?: No  Social/Family/Support Systems Anticipated Caregiver: granddaughter, [name], patient's brother Anticipated Caregiver's Contact Information: [phone] Ability/Limitations of Caregiver: supervision Caregiver Availability: 24/7 Discharge Plan Discussed with Primary Caregiver: Yes Is Caregiver In Agreement with Plan?: Yes Does Caregiver/Family have Issues with Lodging/Transportation while Pt is in Rehab?: No  Goals Patient/Family Goal for Rehab: Mod I PT, OT Expected length of stay: 10-12 days Pt/Family Agrees to Admission and willing to participate: Yes Program Orientation Provided & Reviewed with Pt/Caregiver Including Roles  & Responsibilities: Yes  Barriers to Discharge: Insurance for SNF coverage  Decrease burden of Care through IP rehab admission: Othern/a  Possible need for SNF placement upon discharge: not anticipated  Patient Condition: I have reviewed medical records from [facility], spoken with  [facility] , and patient and family member. I met with patient at the bedside and discussed via phone for inpatient rehabilitation assessment.  Patient will benefit from ongoing PT and OT, can actively participate in 3 hours of therapy a day 5 days of the week, and can make measurable gains during the admission.  Patient will also benefit from the coordinated team approach during an Inpatient Acute Rehabilitation admission.  The patient will receive intensive therapy as well as Rehabilitation physician, nursing, social worker, and care management interventions.  Due to safety, skin/wound care, disease management, medication administration, pain management, and patient education the patient requires 24 hour a day rehabilitation nursing.  The patient is  currently *** with mobility and basic ADLs.  Discharge setting and therapy post discharge at [facility] IP discharge location:[id number]} is anticipated.  Patient has agreed to participate in the Acute Inpatient Rehabilitation Program and will admit {Time; today/tomorrow:10263}.  Preadmission Screen Completed By:  Beryle Beams, 11/29/2022 3:41 PM ______________________________________________________________________   Discussed status with Dr. Marland Kitchen on *** at *** and received approval for admission today.  Admission Coordinator:  Beryle Beams, time ***/Date ***   Assessment/Plan: Diagnosis: Does the need for close, 24 hr/day Medical supervision in concert with the patient's rehab needs make it unreasonable for this patient to be served in a less intensive setting? {yes_no_potentially:3041433} Co-Morbidities requiring supervision/potential complications: *** Due to {due EA:5409811}, does the patient require 24 hr/day rehab nursing? {yes_no_potentially:3041433} Does the patient require coordinated care of a physician, rehab nurse, PT, OT, and SLP to address physical and functional deficits in the context of the above medical diagnosis(es)? {yes_no_potentially:3041433} Addressing deficits in the following areas: {deficits:3041436} Can the patient actively participate in an intensive therapy program of at least 3 hrs of therapy 5 days a week? {yes_no_potentially:3041433} The potential for patient to make measurable gains while on inpatient rehab is {potential:3041437} Anticipated functional outcomes upon discharge from inpatient rehab: {functional outcomes:304600100} PT, {functional outcomes:304600100} OT, {functional outcomes:304600100} SLP Estimated rehab length of stay to reach the above functional goals is: *** Anticipated discharge destination: {anticipated dc setting:21604} 10. Overall Rehab/Functional Prognosis: {potential:3041437}   MD Signature: ***

## 2022-11-29 NOTE — Consult Note (Signed)
WOC Nurse Consult Note: Reason for Consult:10 wounds Wound type: patient sustained multiple falls, presents with multiple abrasions and skin tears over the UE/head/face/LEs Pressure Injury POA: NA Measurement: see nursing notes Wound VWU:[id number] tears Drainage (amount, consistency, odor) see nursing notes Periwound:frail skin Dressing procedure/placement/frequency: Using the nursing skin care order set. Change all skin tears and abrasions to be covered with single layer of xeroform gauze [employer] [place] # [phone]). Top with silicone foam or secure with kerlix, change every 3 days  NO TAPE directly on patient's skin   Re consult if needed, will not follow at this time. Thanks  Kuuipo Anzaldo [employer], RN,CWOCN, CNS, CWON-AP [phone])

## 2022-11-29 NOTE — Progress Notes (Signed)
Ok to stop abx per Dr Thedore Mins.  Ulyses Southward, PharmD, BCIDP, AAHIVP, CPP Infectious Disease Pharmacist 11/29/2022 1:42 PM

## 2022-11-30 ENCOUNTER — Other Ambulatory Visit: Payer: Self-pay

## 2022-11-30 ENCOUNTER — Inpatient Hospital Stay (HOSPITAL_COMMUNITY)
Admission: RE | Admit: 2022-11-30 | Discharge: 2022-12-11 | DRG: 945 | Disposition: A | Discharge status: Home Health | Payer: Medicare Other | Source: Intra-hospital | Attending: Physical Medicine and Rehabilitation | Admitting: Physical Medicine and Rehabilitation

## 2022-11-30 DIAGNOSIS — S0081XD Abrasion of other part of head, subsequent encounter: Secondary | ICD-10-CM

## 2022-11-30 DIAGNOSIS — Z79899 Other long term (current) drug therapy: Secondary | ICD-10-CM

## 2022-11-30 DIAGNOSIS — Z833 Family history of diabetes mellitus: Secondary | ICD-10-CM

## 2022-11-30 DIAGNOSIS — Z7902 Long term (current) use of antithrombotics/antiplatelets: Secondary | ICD-10-CM

## 2022-11-30 DIAGNOSIS — J849 Interstitial pulmonary disease, unspecified: Secondary | ICD-10-CM | POA: Diagnosis present

## 2022-11-30 DIAGNOSIS — M1711 Unilateral primary osteoarthritis, right knee: Secondary | ICD-10-CM | POA: Diagnosis not present

## 2022-11-30 DIAGNOSIS — J9621 Acute and chronic respiratory failure with hypoxia: Secondary | ICD-10-CM | POA: Diagnosis not present

## 2022-11-30 DIAGNOSIS — M109 Gout, unspecified: Secondary | ICD-10-CM | POA: Diagnosis present

## 2022-11-30 DIAGNOSIS — T447X5A Adverse effect of beta-adrenoreceptor antagonists, initial encounter: Secondary | ICD-10-CM | POA: Diagnosis not present

## 2022-11-30 DIAGNOSIS — W19XXXD Unspecified fall, subsequent encounter: Secondary | ICD-10-CM | POA: Diagnosis present

## 2022-11-30 DIAGNOSIS — I2729 Other secondary pulmonary hypertension: Secondary | ICD-10-CM | POA: Diagnosis present

## 2022-11-30 DIAGNOSIS — I2510 Atherosclerotic heart disease of native coronary artery without angina pectoris: Secondary | ICD-10-CM | POA: Diagnosis present

## 2022-11-30 DIAGNOSIS — N1832 Chronic kidney disease, stage 3b: Secondary | ICD-10-CM | POA: Diagnosis present

## 2022-11-30 DIAGNOSIS — R5381 Other malaise: Principal | ICD-10-CM

## 2022-11-30 DIAGNOSIS — E8809 Other disorders of plasma-protein metabolism, not elsewhere classified: Secondary | ICD-10-CM | POA: Diagnosis not present

## 2022-11-30 DIAGNOSIS — I130 Hypertensive heart and chronic kidney disease with heart failure and stage 1 through stage 4 chronic kidney disease, or unspecified chronic kidney disease: Secondary | ICD-10-CM | POA: Diagnosis present

## 2022-11-30 DIAGNOSIS — J9601 Acute respiratory failure with hypoxia: Secondary | ICD-10-CM | POA: Diagnosis not present

## 2022-11-30 DIAGNOSIS — Z955 Presence of coronary angioplasty implant and graft: Secondary | ICD-10-CM | POA: Diagnosis not present

## 2022-11-30 DIAGNOSIS — Z87891 Personal history of nicotine dependence: Secondary | ICD-10-CM

## 2022-11-30 DIAGNOSIS — I272 Pulmonary hypertension, unspecified: Secondary | ICD-10-CM | POA: Diagnosis not present

## 2022-11-30 DIAGNOSIS — S0031XD Abrasion of nose, subsequent encounter: Secondary | ICD-10-CM | POA: Diagnosis not present

## 2022-11-30 DIAGNOSIS — J9690 Respiratory failure, unspecified, unspecified whether with hypoxia or hypercapnia: Secondary | ICD-10-CM | POA: Diagnosis present

## 2022-11-30 DIAGNOSIS — Z981 Arthrodesis status: Secondary | ICD-10-CM | POA: Diagnosis not present

## 2022-11-30 DIAGNOSIS — M25561 Pain in right knee: Secondary | ICD-10-CM | POA: Diagnosis present

## 2022-11-30 DIAGNOSIS — R739 Hyperglycemia, unspecified: Secondary | ICD-10-CM | POA: Diagnosis not present

## 2022-11-30 DIAGNOSIS — I50810 Right heart failure, unspecified: Secondary | ICD-10-CM | POA: Diagnosis present

## 2022-11-30 DIAGNOSIS — I10 Essential (primary) hypertension: Secondary | ICD-10-CM | POA: Diagnosis not present

## 2022-11-30 DIAGNOSIS — Z7982 Long term (current) use of aspirin: Secondary | ICD-10-CM

## 2022-11-30 DIAGNOSIS — I700 Atherosclerosis of aorta: Secondary | ICD-10-CM | POA: Diagnosis present

## 2022-11-30 DIAGNOSIS — Z8249 Family history of ischemic heart disease and other diseases of the circulatory system: Secondary | ICD-10-CM

## 2022-11-30 DIAGNOSIS — J8410 Pulmonary fibrosis, unspecified: Secondary | ICD-10-CM | POA: Diagnosis present

## 2022-11-30 DIAGNOSIS — Z888 Allergy status to other drugs, medicaments and biological substances status: Secondary | ICD-10-CM

## 2022-11-30 DIAGNOSIS — I959 Hypotension, unspecified: Secondary | ICD-10-CM | POA: Diagnosis not present

## 2022-11-30 DIAGNOSIS — G894 Chronic pain syndrome: Secondary | ICD-10-CM | POA: Diagnosis present

## 2022-11-30 DIAGNOSIS — J84112 Idiopathic pulmonary fibrosis: Secondary | ICD-10-CM

## 2022-11-30 DIAGNOSIS — Z809 Family history of malignant neoplasm, unspecified: Secondary | ICD-10-CM

## 2022-11-30 DIAGNOSIS — E785 Hyperlipidemia, unspecified: Secondary | ICD-10-CM | POA: Diagnosis present

## 2022-11-30 DIAGNOSIS — Z886 Allergy status to analgesic agent status: Secondary | ICD-10-CM

## 2022-11-30 DIAGNOSIS — Z880 Allergy status to penicillin: Secondary | ICD-10-CM

## 2022-11-30 LAB — BASIC METABOLIC PANEL
Anion gap: 6 (ref 5–15)
BUN: 38 mg/dL — ABNORMAL HIGH (ref 8–23)
CO2: 32 mmol/L (ref 22–32)
Calcium: 8.7 mg/dL — ABNORMAL LOW (ref 8.9–10.3)
Chloride: 99 mmol/L (ref 98–111)
Creatinine, Ser: 1.15 mg/dL (ref 0.61–1.24)
GFR, Estimated: >60 mL/min (ref >60)
Glucose, Bld: 113 mg/dL — ABNORMAL HIGH (ref 70–99)
Potassium: 4.3 mmol/L (ref 3.5–5.1)
Sodium: 137 mmol/L (ref 135–145)

## 2022-11-30 LAB — CBC WITH DIFFERENTIAL/PLATELET
Abs Immature Granulocytes: 0.1 10*3/uL — ABNORMAL HIGH (ref 0.00–0.07)
Basophils Absolute: 0 10*3/uL (ref 0.0–0.1)
Basophils Relative: 0 %
Eosinophils Absolute: 0 10*3/uL (ref 0.0–0.5)
Eosinophils Relative: 0 %
HCT: 40.7 % (ref 39.0–52.0)
Hemoglobin: 13.2 g/dL (ref 13.0–17.0)
Immature Granulocytes: 1 %
Lymphocytes Relative: 7 %
Lymphs Abs: 0.9 10*3/uL (ref 0.7–4.0)
MCH: 30 pg (ref 26.0–34.0)
MCHC: 32.4 g/dL (ref 30.0–36.0)
MCV: 92.5 fL (ref 80.0–100.0)
Monocytes Absolute: 0.6 10*3/uL (ref 0.1–1.0)
Monocytes Relative: 5 %
Neutro Abs: 10.5 10*3/uL — ABNORMAL HIGH (ref 1.7–7.7)
Neutrophils Relative %: 87 %
Platelets: 148 10*3/uL — ABNORMAL LOW (ref 150–400)
RBC: 4.4 MIL/uL (ref 4.22–5.81)
RDW: 14.3 % (ref 11.5–15.5)
WBC: 12.1 10*3/uL — ABNORMAL HIGH (ref 4.0–10.5)
nRBC: 0 % (ref 0.0–0.2)

## 2022-11-30 LAB — C-REACTIVE PROTEIN: CRP: 0.7 mg/dL (ref <1.0)

## 2022-11-30 LAB — BRAIN NATRIURETIC PEPTIDE: B Natriuretic Peptide: 1098.7 pg/mL — ABNORMAL HIGH (ref 0.0–100.0)

## 2022-11-30 LAB — MAGNESIUM: Magnesium: 2.2 mg/dL (ref 1.7–2.4)

## 2022-11-30 LAB — PROCALCITONIN: Procalcitonin: <0.1 ng/mL

## 2022-11-30 MED ORDER — ENSURE ENLIVE PO LIQD: 237.0000 mL | Freq: Two times a day (BID) | ORAL | Status: DC

## 2022-11-30 MED ORDER — ALUM & MAG HYDROXIDE-SIMETH 200-200-20 MG/5ML PO SUSP: 30.0000 mL | ORAL | Status: DC | PRN

## 2022-11-30 MED ORDER — PREDNISONE 20 MG PO TABS
20.0000 mg | ORAL_TABLET | Freq: Every day | ORAL | Status: AC
  Administered 2022-12-06 – 2022-12-10 (×5): 20 mg via ORAL
  Filled 2022-11-30 (×5): qty 1

## 2022-11-30 MED ORDER — ATORVASTATIN CALCIUM 80 MG PO TABS
80.0000 mg | ORAL_TABLET | Freq: Every day | ORAL | Status: DC
  Administered 2022-12-01 – 2022-12-11 (×11): 80 mg via ORAL
  Filled 2022-11-30 (×11): qty 1

## 2022-11-30 MED ORDER — TORSEMIDE 10 MG PO TABS: 10.0000 mg | ORAL_TABLET | Freq: Every day | ORAL | Status: DC

## 2022-11-30 MED ORDER — PANTOPRAZOLE SODIUM 40 MG PO TBEC
40.0000 mg | DELAYED_RELEASE_TABLET | Freq: Every day | ORAL | Status: DC
  Administered 2022-12-01 – 2022-12-03 (×3): 40 mg via ORAL
  Filled 2022-11-30 (×3): qty 1

## 2022-11-30 MED ORDER — METOPROLOL TARTRATE 25 MG PO TABS
25.0000 mg | ORAL_TABLET | Freq: Two times a day (BID) | ORAL | Status: DC
  Administered 2022-11-30 – 2022-12-02 (×5): 25 mg via ORAL
  Filled 2022-11-30 (×6): qty 1

## 2022-11-30 MED ORDER — PREDNISONE 20 MG PO TABS: 20.0000 mg | ORAL_TABLET | Freq: Every day | ORAL | Status: DC

## 2022-11-30 MED ORDER — ONDANSETRON HCL 4 MG/2ML IJ SOLN: 4.0000 mg | Freq: Four times a day (QID) | INTRAMUSCULAR | Status: DC | PRN

## 2022-11-30 MED ORDER — BENZONATATE 100 MG PO CAPS
100.0000 mg | ORAL_CAPSULE | ORAL | Status: DC | PRN
  Administered 2022-11-30 – 2022-12-05 (×2): 100 mg via ORAL
  Filled 2022-11-30 (×2): qty 1

## 2022-11-30 MED ORDER — ISOSORBIDE MONONITRATE ER 30 MG PO TB24: 30.0000 mg | ORAL_TABLET | Freq: Every morning | ORAL | Status: DC

## 2022-11-30 MED ORDER — CLOPIDOGREL BISULFATE 75 MG PO TABS
75.0000 mg | ORAL_TABLET | Freq: Every day | ORAL | Status: DC
  Administered 2022-12-01 – 2022-12-11 (×11): 75 mg via ORAL
  Filled 2022-11-30 (×11): qty 1

## 2022-11-30 MED ORDER — POLYETHYLENE GLYCOL 3350 17 G PO PACK: 17.0000 g | PACK | Freq: Every day | ORAL | Status: DC | PRN

## 2022-11-30 MED ORDER — FUROSEMIDE 10 MG/ML IJ SOLN
40.0000 mg | Freq: Once | INTRAMUSCULAR | Status: AC
  Administered 2022-11-30: 40 mg via INTRAVENOUS
  Filled 2022-11-30: qty 4

## 2022-11-30 MED ORDER — ISOSORBIDE MONONITRATE ER 30 MG PO TB24
30.0000 mg | ORAL_TABLET | Freq: Every morning | ORAL | Status: DC
  Administered 2022-12-01 – 2022-12-11 (×11): 30 mg via ORAL
  Filled 2022-11-30 (×11): qty 1

## 2022-11-30 MED ORDER — SODIUM CHLORIDE (PF) 0.9 % IJ SOLN
INTRAMUSCULAR | Status: AC
  Administered 2022-11-30: 10 mL
  Filled 2022-11-30: qty 10

## 2022-11-30 MED ORDER — ALBUTEROL SULFATE (2.5 MG/3ML) 0.083% IN NEBU
2.5000 mg | INHALATION_SOLUTION | RESPIRATORY_TRACT | Status: DC | PRN
  Administered 2022-11-30: 2.5 mg via RESPIRATORY_TRACT
  Filled 2022-11-30: qty 3

## 2022-11-30 MED ORDER — METHOCARBAMOL 500 MG PO TABS
500.0000 mg | ORAL_TABLET | Freq: Four times a day (QID) | ORAL | Status: DC | PRN
  Administered 2022-12-04: 500 mg via ORAL
  Filled 2022-11-30: qty 1

## 2022-11-30 MED ORDER — HEPARIN SODIUM (PORCINE) 5000 UNIT/ML IJ SOLN
5000.0000 [IU] | Freq: Three times a day (TID) | INTRAMUSCULAR | Status: DC
  Administered 2022-11-30 – 2022-12-11 (×32): 5000 [IU] via SUBCUTANEOUS
  Filled 2022-11-30 (×33): qty 1

## 2022-11-30 MED ORDER — ALLOPURINOL 300 MG PO TABS
300.0000 mg | ORAL_TABLET | Freq: Every morning | ORAL | Status: DC
  Administered 2022-12-01 – 2022-12-11 (×11): 300 mg via ORAL
  Filled 2022-11-30 (×11): qty 1

## 2022-11-30 MED ORDER — ACETAMINOPHEN 325 MG PO TABS: 325.0000 mg | ORAL_TABLET | ORAL | Status: DC | PRN

## 2022-11-30 MED ORDER — METHYLPREDNISOLONE SODIUM SUCC 125 MG IJ SOLR
40.0000 mg | Freq: Two times a day (BID) | INTRAMUSCULAR | Status: AC
  Administered 2022-11-30: 40 mg via INTRAVENOUS
  Filled 2022-11-30: qty 2

## 2022-11-30 MED ORDER — TIZANIDINE HCL 4 MG PO TABS
4.0000 mg | ORAL_TABLET | Freq: Every day | ORAL | Status: DC
  Administered 2022-11-30 – 2022-12-10 (×11): 4 mg via ORAL
  Filled 2022-11-30 (×11): qty 1

## 2022-11-30 MED ORDER — VITAMIN B-12 1000 MCG PO TABS
1000.0000 ug | ORAL_TABLET | Freq: Every morning | ORAL | Status: DC
  Administered 2022-12-01 – 2022-12-11 (×11): 1000 ug via ORAL
  Filled 2022-11-30 (×11): qty 1

## 2022-11-30 MED ORDER — BISACODYL 5 MG PO TBEC: 5.0000 mg | DELAYED_RELEASE_TABLET | Freq: Every day | ORAL | Status: DC | PRN

## 2022-11-30 MED ORDER — PIRFENIDONE 801 MG PO TABS
1.0000 | ORAL_TABLET | Freq: Three times a day (TID) | ORAL | Status: DC
  Administered 2022-11-30 – 2022-12-11 (×32): 801 mg via ORAL
  Filled 2022-11-30 (×31): qty 1

## 2022-11-30 MED ORDER — PREDNISONE 20 MG PO TABS
40.0000 mg | ORAL_TABLET | Freq: Every day | ORAL | Status: AC
  Administered 2022-12-01 – 2022-12-05 (×5): 40 mg via ORAL
  Filled 2022-11-30 (×5): qty 2

## 2022-11-30 MED ORDER — VITAMIN D 25 MCG (1000 UNIT) PO TABS
5000.0000 [IU] | ORAL_TABLET | Freq: Every morning | ORAL | Status: DC
  Administered 2022-12-01 – 2022-12-11 (×11): 5000 [IU] via ORAL
  Filled 2022-11-30 (×11): qty 5

## 2022-11-30 MED ORDER — FLEET ENEMA 7-19 GM/118ML RE ENEM: 1.0000 | ENEMA | Freq: Once | RECTAL | Status: DC | PRN

## 2022-11-30 MED ORDER — ASPIRIN 81 MG PO TBEC
81.0000 mg | DELAYED_RELEASE_TABLET | Freq: Every morning | ORAL | Status: DC
  Administered 2022-12-01 – 2022-12-11 (×11): 81 mg via ORAL
  Filled 2022-11-30 (×11): qty 1

## 2022-11-30 MED ORDER — HEPARIN SODIUM (PORCINE) 5000 UNIT/ML IJ SOLN
5000.0000 [IU] | Freq: Three times a day (TID) | INTRAMUSCULAR | Status: DC
Start: 2022-11-30 — End: 2022-11-30

## 2022-11-30 MED ORDER — ONDANSETRON HCL 4 MG PO TABS: 4.0000 mg | ORAL_TABLET | Freq: Four times a day (QID) | ORAL | Status: DC | PRN

## 2022-11-30 MED ORDER — PREDNISONE 5 MG PO TABS
10.0000 mg | ORAL_TABLET | Freq: Every day | ORAL | Status: DC
  Administered 2022-12-11: 10 mg via ORAL
  Filled 2022-11-30: qty 2

## 2022-11-30 NOTE — H&P (Signed)
Physical Medicine and Rehabilitation Admission H&P     CC: Functional deficits secondary to acute on chronic hypoxemic respiratory failure    HPI: Maurice Munoz is a 77 year old male with a past medical history of interstitial lung disease dating back to at least 2016 who presented to the emergency department at [facility] on 11/26/2022 with recurrent syncope and worsening dyspnea on exertion.  The patient reported that he did not check his oxygen frequently at home and was not wearing his oxygen as prescribed or recommended.  As part of his workup he underwent CTA of head and neck with evidence of incidental complete right ICA occlusion.  CTA with PE protocol was negative for PE but did demonstrate severe IPF and honeycombing, interlobar septal thickening and diffuse central groundglass opacities with bilateral pleural effusions.  Felt to be concerning for pulmonary edema and cardiogenic volume overload.  He was transferred from [facility] to [facility] for further evaluation.  MRI brain non-acute. Pulmonology service was consulted.  BNP was 2,446.  Aggressive diuresis of at least 1 L daily was recommended with close monitoring of blood pressure and kidney function.  O2 saturation goal of 88%'s weaning as tolerated.  He was started on IV Solu-Medrol for 3 days and will continue with prednisone taper.  TTE was updated. EF ~55-60%.  He is tolerating a heart healthy diet.  BUN today elevated at 38 creatinine normal at 1.15.  Urine potassium and magnesium have been normal.  BNP 1098.  Cytosis continues to improve.  Mild thrombocytopenia.  Oxygen saturation levels have been normal on 3 L oxygen via nasal cannula. The patient requires inpatient medicine and rehabilitation evaluations and services for ongoing dysfunction secondary to acute on chronic hypoxemic respiratory failure.   Recall history significant for PCI x 5 and demonstrated nonobstructive CAD.  Demonstration of pulmonary hypertension with PVR of 6.89,  LVEDP of 15.  He was prescribed Lasix.  He follows with Dr. Tresa Endo.   Has had multiple spine surgeries, non-healing pilonidal cyst with chronic pain. No tobacco or alcohol use. Lives alone and has friends who provide support as well as several step-children.    Review of Systems  Constitutional:  Negative for chills and fever.  HENT:  Negative for congestion and sore throat.   Eyes:  Negative for blurred vision and double vision.  Respiratory:  Positive for cough and shortness of breath.   Cardiovascular:  Negative for chest pain and palpitations.  Gastrointestinal:  Negative for nausea and vomiting.  Genitourinary:  Negative for dysuria, hematuria and urgency.  Musculoskeletal:  Positive for back pain and neck pain.       Chronic back/neck pain; uses Percocet as needed  Neurological:  Negative for dizziness and headaches.  Psychiatric/Behavioral:  Negative for depression. The patient does not have insomnia.         Past Medical History:  Diagnosis Date   Adrenal adenoma, left      small   Aortic atherosclerosis (HCC)     Back pain     Bilateral carotid artery stenosis followed by dr Tresa Endo    per last carotid duplex 07-17-2014  -- proximal LICA 50-69% ,  RICA 0-49%   CAD (coronary artery disease)      a. s/p prior intervention to RCA and PDA in 1999 b. DES to LAD and distal RCA in 2007   Cardiomegaly      Stable   Chronic constipation     Chronic pain syndrome     CKD (  chronic kidney disease), stage III (HCC)     DDD (degenerative disc disease), cervical     DDD (degenerative disc disease), lumbosacral     Dyspnea     Dyspnea on exertion     Gout      per pt last inflared episode 2015 approx.   Grade II diastolic dysfunction     H/O epistaxis      per pt sees ENT dr Suszanne Conners for cauterization (pt takes plavix)   History of acute pancreatitis 08/08/2014   History of kidney stones     HTN (hypertension)     Hyperlipidemia     Interstitial lung disease (HCC)     Left arm pain       s/p fall   Nocturia     Numbness of right foot      due to DDD lumbar   Peripheral edema     Pilonidal cyst     Pleural effusion on right     Pulmonary fibrosis (HCC)      followed by pcp-- last chest CT 04-08-2016   S/P drug eluting coronary stent placement 10/11/2005    mLAD and dRCA   Wears dentures      upper   Wears glasses           Past Surgical History:  Procedure Laterality Date   ANTERIOR CERVICAL DECOMP/DISCECTOMY FUSION   12-25-2001     dr Terrilee [facility]    C3 -- C7   ANTERIOR CERVICAL DECOMP/DISCECTOMY FUSION   12-11-2012   dr Channing Mutters  [facility])   CARDIOVASCULAR STRESS TEST   08-23-2012    dr Tresa Endo    normal lexiscan nuclear study w/ no ischemia/  normal LV function and wall motion, ef 65%   CARDIOVASCULAR STRESS TEST       CORONARY ANGIOPLASTY   1999    PTCA to RCA & PDA   CORONARY ANGIOPLASTY WITH STENT PLACEMENT   10-11-2005  dr [name]    PCI and stenting to midLAD & dRCA (Cypher DES x2)   CORONARY BALLOON ANGIOPLASTY N/A 04/14/2018    Procedure: CORONARY BALLOON ANGIOPLASTY;  Surgeon: Lennette Bihari, MD;  Location: [facility] INVASIVE CV LAB;  Service: Cardiovascular;  Laterality: N/A;   CORONARY STENT INTERVENTION N/A 04/14/2018    Procedure: CORONARY STENT INTERVENTION;  Surgeon: Lennette Bihari, MD;  Location: [facility] INVASIVE CV LAB;  Service: Cardiovascular;  Laterality: N/A;   CYSTOSCOPY W/ URETERAL STENT PLACEMENT Left 02-11-2010     [facility]   LEFT HEART CATH N/A 04/14/2018    Procedure: Left Heart Cath;  Surgeon: Lennette Bihari, MD;  Location: [facility] INVASIVE CV LAB;  Service: Cardiovascular;  Laterality: N/A;   LEFT HEART CATH AND CORONARY ANGIOGRAPHY N/A 04/12/2018    Procedure: LEFT HEART CATH AND CORONARY ANGIOGRAPHY;  Surgeon: Corky Crafts, MD;  Location: [facility] INVASIVE CV LAB;  Service: Cardiovascular;  Laterality: N/A;   LUMBAR LAMINECTOMY   2015    L5 -- S1   PILONIDAL CYST EXCISION   1980s   PILONIDAL CYST EXCISION N/A 03/31/2017     Procedure: EXCISION OF PILONIDAL DISEASE with Flap Rotation;  Surgeon: Romie Levee, MD;  Location: [facility];  Service: General;  Laterality: N/A;   RIGHT/LEFT HEART CATH AND CORONARY ANGIOGRAPHY N/A 10/18/2022    Procedure: RIGHT/LEFT HEART CATH AND CORONARY ANGIOGRAPHY;  Surgeon: Lennette Bihari, MD;  Location: [facility] INVASIVE CV LAB;  Service: Cardiovascular;  Laterality: N/A;   THROAT SURGERY  1996    per pt "removal piece of cryloid(?) that was pressing against throat"  states no issues since   TRANSTHORACIC ECHOCARDIOGRAM   08-24-2010  dr Tresa Endo    ef 50-55%/  mild MR/  trivial TR   WOUND DEBRIDEMENT N/A 10/06/2017    Procedure: DEBRIDEMENT WOUND PLACEMENT OF WOUND HEALING MATRIX;  Surgeon: Romie Levee, MD;  Location: [facility];  Service: General;  Laterality: N/A;         Family History  Problem Relation Age of Onset   Cancer Father     Heart attack Brother     Diabetes Brother     Colon cancer Neg Hx      Social History:  reports that he quit smoking about 34 years ago. His smoking use included cigarettes. He has a 75.00 pack-year smoking history. He quit smokeless tobacco use about 33 years ago. He reports that he does not drink alcohol and does not use drugs. Allergies:       Allergies  Allergen Reactions   Ibuprofen Itching      Motrin brand   Neosporin [Neomycin-Bacitracin Zn-Polymyx] Swelling   Penicillins Rash       Has patient had a PCN reaction causing immediate rash, facial/tongue/throat swelling, SOB or lightheadedness with hypotension: No Has patient had a PCN reaction causing severe rash involving mucus membranes or skin necrosis: No Has patient had a PCN reaction that required hospitalization: No Has patient had a PCN reaction occurring within the last 10 years: No If all of the above answers are "NO", then may proceed with Cephalosporin use.               Facility-Administered Medications Prior to Admission  Medication Dose  Route Frequency Provider Last Rate Last Admin   sodium chloride flush (NS) 0.9 % injection 3 mL  3 mL Intravenous Q12H Lennette Bihari, MD              Medications Prior to Admission  Medication Sig Dispense Refill   allopurinol (ZYLOPRIM) 300 MG tablet Take 300 mg by mouth in the morning.       amLODipine (NORVASC) 5 MG tablet Take 1.5 tablets (7.5 mg total) by mouth daily. (Patient taking differently: Take 5 mg by mouth daily.) 90 tablet 2   aspirin EC 81 MG tablet Take 81 mg by mouth in the morning.       atorvastatin (LIPITOR) 80 MG tablet TAKE ONE TABLET (80MG  TOTAL) BY MOUTH AT6PM (Patient taking differently: Take 80 mg by mouth daily.) 90 tablet 3   clopidogrel (PLAVIX) 75 MG tablet TAKE ONE (1) TABLET BY MOUTH EVERY DAY 90 tablet 2   cyanocobalamin (VITAMIN B12) 1000 MCG tablet Take 1,000 mcg by mouth in the morning.       ESBRIET 801 MG TABS Take 1 tablet (801 mg total) by mouth 3 (three) times daily with meals. (Patient taking differently: Take 1 tablet by mouth See admin instructions. Take 1 tablet (801 mg) by mouth up to 3 times daily with meals.) 270 tablet 1   isosorbide mononitrate (IMDUR) 60 MG 24 hr tablet Take 60 mg  ( 1 tablet)  in the morning and  30 mg ( 1/2 tablet )  in the evening (Patient taking differently: Take 30-60 mg by mouth See admin instructions. Take 60 mg  ( 1 tablet)  in the morning and  30 mg ( 1/2 tablet )  in the evening) 135 tablet 3   lisinopril (ZESTRIL)  2.5 MG tablet TAKE ONE TABLET (2.5MG  TOTAL) BY MOUTH DAILY (Patient taking differently: Take 2.5 mg by mouth daily.) 90 tablet 1   metoprolol tartrate (LOPRESSOR) 50 MG tablet Take 2 tablets (100 mg total) by mouth every morning AND 1.5 tablets (75 mg total) every evening. 270 tablet 3   nitroGLYCERIN (NITROSTAT) 0.4 MG SL tablet Place 1 tablet (0.4 mg total) under the tongue every 5 (five) minutes as needed for chest pain. 25 tablet 3   oxyCODONE-acetaminophen (PERCOCET) 10-325 MG per tablet Take 1 tablet by  mouth every 6 (six) hours as needed for pain.       tiZANidine (ZANAFLEX) 4 MG tablet Take 4 mg by mouth at bedtime.        VITAMIN D PO Take 5,000 Units by mouth in the morning.       [DISCONTINUED] predniSONE (DELTASONE) 10 MG tablet Take 10-60 mg by mouth daily as needed (gout flares).       [DISCONTINUED] torsemide (DEMADEX) 20 MG tablet TAKE ONE TABLET (20MG  TOTAL) BY MOUTH DAILY. IF ADDITIONAL SWELLING, MAY TAKE AN ADDITIONAL DOSE IN THE AFTERNOON (Patient taking differently: Take 20 mg by mouth 2 (two) times a week. May take an additional dose the afternoon) 180 tablet 1   acetaminophen (TYLENOL) 500 MG tablet Take 1,000 mg by mouth every 6 (six) hours as needed (pain.).       benzonatate (TESSALON) 200 MG capsule TAKE ONE CAPSULE (200MG  TOTAL) BY MOUTH TWO TIMES DAILY AS NEEDED FOR COUGH (Patient not taking: Reported on 11/26/2022) 60 capsule 2   doxycycline (VIBRAMYCIN) 100 MG capsule Take 100 mg by mouth 2 (two) times daily.       OXYGEN Inhale 4-5 L into the lungs as needed (respiratory issues/with activity).              Home: Home Living Family/patient expects to be discharged to:: Private residence Living Arrangements: Alone Available Help at Discharge: Family, Friend(s), Available 24 hours/day Type of Home: House Home Access: Stairs to enter [employer] of Steps: 1 threshold Entrance Stairs-Rails: None Home Layout: One level Bathroom Shower/Tub: Health visitor: Handicapped height Bathroom Accessibility: Yes Home Equipment: Art gallery manager, Agricultural consultant (2 wheels), [employer] - single point, Information systems manager - built in, [employer] - tub/shower, Hand held shower head   Functional History: Prior Function Prior Level of Function : Independent/Modified Independent, History of Falls (last six months), Driving Mobility Comments: RW in the house, has electric scooter he uses for community distances, 1-2 falls over the past 6 months besides the ones causing  admission ADLs Comments: independent.  Brother assist with iADL and brings him his groceries.   Functional Status:  Mobility: Bed Mobility Overal bed mobility: Needs Assistance Bed Mobility: Sit to Supine Supine to sit: Min assist, HOB elevated Sit to supine: Min guard General bed mobility comments: pt up in chair on arrival Transfers Overall transfer level: Needs assistance Equipment used: Rolling walker (2 wheels) Transfers: Sit to/from Stand, Bed to chair/wheelchair/BSC Sit to Stand: Min assist Bed to/from chair/wheelchair/BSC transfer type:: Stand pivot Stand pivot transfers: Min assist General transfer comment: min A to power up from recliner with pt needing increased time to eleavte trunk to full upright standing Ambulation/Gait General Gait Details: not attempted today due to BP Pre-gait activities: standing marching x10   ADL: ADL Overall ADL's : Needs assistance/impaired Eating/Feeding: Independent, Sitting Grooming: Wash/dry hands, Wash/dry face, Set up, Sitting Upper Body Bathing: Minimal assistance, Sitting Lower Body Bathing: Minimal assistance,  Sit to/from stand Upper Body Dressing : Minimal assistance, Moderate assistance, Sitting Lower Body Dressing: Minimal assistance, Sit to/from stand Toilet Transfer: Minimal assistance, Stand-pivot, BSC/3in1   Cognition: Cognition Overall Cognitive Status: Within Functional Limits for tasks assessed Orientation Level: Oriented X4 Cognition Arousal/Alertness: Awake/alert Behavior During Therapy: WFL for tasks assessed/performed Overall Cognitive Status: Within Functional Limits for tasks assessed General Comments: A&Ox4, pleasant throughout session   Physical Exam: Blood pressure 125/68, pulse 70, temperature 97.8 F (36.6 C), temperature source Oral, resp. rate 17, height 6\' 1"  (1.854 m), weight 85.2 kg, SpO2 99 %. Physical Exam Constitutional:      General: He is not in acute distress.    Comments: Multiple  bandages  HENT:     Head:     Comments: Abrasions on forehead, nose    Right Ear: External ear normal.     Left Ear: External ear normal.  Eyes:     Extraocular Movements: Extraocular movements intact.     Conjunctiva/sclera: Conjunctivae normal.     Pupils: Pupils are equal, round, and reactive to light.  Cardiovascular:     Rate and Rhythm: Normal rate and regular rhythm.     Heart sounds: No murmur heard. Pulmonary:     Effort: Pulmonary effort is normal. No respiratory distress.     Breath sounds: No wheezing or rales.     Comments: O2 [place] Abdominal:     General: There is no distension.     Palpations: Abdomen is soft.     Tenderness: There is no abdominal tenderness.  Musculoskeletal:     Cervical back: No rigidity or tenderness.     Right lower leg: Edema present.     Left lower leg: Edema present.     Comments: Pt demonstrated pain in the right patella and patellar tendon with palpation and with bending near 90 degrees.   Skin:    Comments: Head and nose wounds as above. Large wound at right wrist with dressing saturated with old blood. Smaller left wrist wound. Superficial quarter sized wounds on each inferior patella area. Bruising left lateral foot.   Neurological:     Comments: Alert and oriented x 3. Normal insight and awareness. Intact Memory. Normal language and speech. Cranial nerve exam unremarkable. MMT: 4/5 BUE with limitations distally due to dressings/wrist wounds. BLE 3+/5 HF, KE, 4/5 ADF/PF. No gross sensory deficits appreciated. Normal tone.   Psychiatric:        Mood and Affect: Mood normal.        Behavior: Behavior normal.        Lab Results Last 48 Hours        Results for orders placed or performed during the hospital encounter of 11/26/22 (from the past 48 hour(s))  Magnesium     Status: None    Collection Time: 11/29/22  4:20 AM  Result Value Ref Range    Magnesium 2.0 1.7 - 2.4 mg/dL      Comment: Performed at [facility], [address] [place], [place] [address]  Procalcitonin     Status: None    Collection Time: 11/29/22  4:20 AM  Result Value Ref Range    Procalcitonin <0.10 ng/mL      Comment:        Interpretation: PCT (Procalcitonin) <= 0.5 ng/mL: Systemic infection (sepsis) is not likely. Local bacterial infection is possible. (NOTE)       Sepsis PCT Algorithm  Lower Respiratory Tract                                      Infection PCT Algorithm    ----------------------------     ----------------------------         PCT < 0.25 ng/mL                PCT < 0.10 ng/mL           Strongly encourage             Strongly discourage   discontinuation of antibiotics    initiation of antibiotics    ----------------------------     -----------------------------       PCT 0.25 - 0.50 ng/mL            PCT 0.10 - 0.25 ng/mL               OR       >80% decrease in PCT            Discourage initiation of                                            antibiotics      Encourage discontinuation           of antibiotics    ----------------------------     -----------------------------         PCT >= 0.50 ng/mL              PCT 0.26 - 0.50 ng/mL               AND        <80% decrease in PCT             Encourage initiation of                                             antibiotics       Encourage continuation           of antibiotics    ----------------------------     -----------------------------        PCT >= 0.50 ng/mL                  PCT > 0.50 ng/mL               AND         increase in PCT                  Strongly encourage                                      initiation of antibiotics    Strongly encourage escalation           of antibiotics                                     -----------------------------  PCT <= 0.25 ng/mL                                                 OR                                        > 80% decrease in PCT                                        Discontinue / Do not initiate                                             antibiotics   Performed at [facility], [address], [place], [place] [address]    C-reactive protein     Status: Abnormal    Collection Time: 11/29/22  4:20 AM  Result Value Ref Range    CRP 1.3 (H) <1.0 mg/dL      Comment: Performed at [facility], [address], [place], [place] [address]  CBC with Differential/Platelet     Status: Abnormal    Collection Time: 11/29/22  4:20 AM  Result Value Ref Range    WBC 12.8 (H) 4.0 - 10.5 K/uL    RBC 4.15 (L) 4.22 - 5.81 MIL/uL    Hemoglobin 13.0 13.0 - 17.0 g/dL    HCT 09.8 11.9 - 14.7 %    MCV 95.2 80.0 - 100.0 fL    MCH 31.3 26.0 - 34.0 pg    MCHC 32.9 30.0 - 36.0 g/dL    RDW 82.9 56.2 - 13.0 %    Platelets 127 (L) 150 - 400 K/uL      Comment: REPEATED TO VERIFY    nRBC 0.0 0.0 - 0.2 %    Neutrophils Relative % 88 %    Neutro Abs 11.2 (H) 1.7 - 7.7 K/uL    Lymphocytes Relative 6 %    Lymphs Abs 0.8 0.7 - 4.0 K/uL    Monocytes Relative 5 %    Monocytes Absolute 0.7 0.1 - 1.0 K/uL    Eosinophils Relative 0 %    Eosinophils Absolute 0.0 0.0 - 0.5 K/uL    Basophils Relative 0 %    Basophils Absolute 0.0 0.0 - 0.1 K/uL    Immature Granulocytes 1 %    Abs Immature Granulocytes 0.11 (H) 0.00 - 0.07 K/uL      Comment: Performed at [facility], [address], [place], [place] [address]  Brain natriuretic peptide     Status: Abnormal    Collection Time: 11/29/22  4:20 AM  Result Value Ref Range    B Natriuretic Peptide 1,203.5 (H) 0.0 - 100.0 pg/mL      Comment: Performed at [facility], [address], [place], [place] [address]  Basic metabolic panel     Status: Abnormal    Collection Time: 11/29/22  4:20 AM  Result Value Ref Range    Sodium 136 135 - 145 mmol/L    Potassium 4.3 3.5 - 5.1  mmol/L    Chloride 97 (L) 98 - 111 mmol/L    CO2 31 22 - 32 mmol/L    Glucose, Bld 131 (H) 70 - 99 mg/dL      Comment: Glucose  reference range applies only to samples taken after fasting for at least 8 hours.    BUN 40 (H) 8 - 23 mg/dL    Creatinine, Ser 1.61 (H) 0.61 - 1.24 mg/dL    Calcium 8.6 (L) 8.9 - 10.3 mg/dL    GFR, Estimated 56 (L) >60 mL/min      Comment: (NOTE) Calculated using the CKD-EPI Creatinine Equation (2021)      Anion gap 8 5 - 15      Comment: Performed at [facility], [address], [place], [place] [address]  Magnesium     Status: None    Collection Time: 11/30/22  4:04 AM  Result Value Ref Range    Magnesium 2.2 1.7 - 2.4 mg/dL      Comment: Performed at [facility], [address], [place], [place] [address]  Procalcitonin     Status: None    Collection Time: 11/30/22  4:04 AM  Result Value Ref Range    Procalcitonin <0.10 ng/mL      Comment:        Interpretation: PCT (Procalcitonin) <= 0.5 ng/mL: Systemic infection (sepsis) is not likely. Local bacterial infection is possible. (NOTE)       Sepsis PCT Algorithm           Lower Respiratory Tract                                      Infection PCT Algorithm    ----------------------------     ----------------------------         PCT < 0.25 ng/mL                PCT < 0.10 ng/mL           Strongly encourage             Strongly discourage   discontinuation of antibiotics    initiation of antibiotics    ----------------------------     -----------------------------       PCT 0.25 - 0.50 ng/mL            PCT 0.10 - 0.25 ng/mL               OR       >80% decrease in PCT            Discourage initiation of                                            antibiotics      Encourage discontinuation           of antibiotics    ----------------------------     -----------------------------         PCT >= 0.50 ng/mL              PCT 0.26 - 0.50 ng/mL               AND        <80% decrease in PCT  Encourage initiation of                                             antibiotics       Encourage continuation            of antibiotics    ----------------------------     -----------------------------        PCT >= 0.50 ng/mL                  PCT > 0.50 ng/mL               AND         increase in PCT                  Strongly encourage                                      initiation of antibiotics    Strongly encourage escalation           of antibiotics                                     -----------------------------                                           PCT <= 0.25 ng/mL                                                 OR                                        > 80% decrease in PCT                                       Discontinue / Do not initiate                                             antibiotics   Performed at [facility], [address], [place], [place] [address]    C-reactive protein     Status: None    Collection Time: 11/30/22  4:04 AM  Result Value Ref Range    CRP 0.7 <1.0 mg/dL      Comment: Performed at [facility], [address], [place], [place] [address]  CBC with Differential/Platelet     Status: Abnormal    Collection Time: 11/30/22  4:04 AM  Result Value Ref Range    WBC 12.1 (H) 4.0 - 10.5 K/uL    RBC 4.40 4.22 - 5.81 MIL/uL    Hemoglobin 13.2 13.0 - 17.0 g/dL    HCT 40.7  39.0 - 52.0 %    MCV 92.5 80.0 - 100.0 fL    MCH 30.0 26.0 - 34.0 pg    MCHC 32.4 30.0 - 36.0 g/dL    RDW 69.6 29.5 - 28.4 %    Platelets 148 (L) 150 - 400 K/uL    nRBC 0.0 0.0 - 0.2 %    Neutrophils Relative % 87 %    Neutro Abs 10.5 (H) 1.7 - 7.7 K/uL    Lymphocytes Relative 7 %    Lymphs Abs 0.9 0.7 - 4.0 K/uL    Monocytes Relative 5 %    Monocytes Absolute 0.6 0.1 - 1.0 K/uL    Eosinophils Relative 0 %    Eosinophils Absolute 0.0 0.0 - 0.5 K/uL    Basophils Relative 0 %    Basophils Absolute 0.0 0.0 - 0.1 K/uL    Immature Granulocytes 1 %    Abs Immature Granulocytes 0.10 (H) 0.00 - 0.07 K/uL      Comment: Performed at [facility], [address]  [place], [place] [address]  Brain natriuretic peptide     Status: Abnormal    Collection Time: 11/30/22  4:04 AM  Result Value Ref Range    B Natriuretic Peptide 1,098.7 (H) 0.0 - 100.0 pg/mL      Comment: Performed at [facility], [address], [place], [place] [address]  Basic metabolic panel     Status: Abnormal    Collection Time: 11/30/22  4:04 AM  Result Value Ref Range    Sodium 137 135 - 145 mmol/L    Potassium 4.3 3.5 - 5.1 mmol/L    Chloride 99 98 - 111 mmol/L    CO2 32 22 - 32 mmol/L    Glucose, Bld 113 (H) 70 - 99 mg/dL      Comment: Glucose reference range applies only to samples taken after fasting for at least 8 hours.    BUN 38 (H) 8 - 23 mg/dL    Creatinine, Ser 2.53 0.61 - 1.24 mg/dL    Calcium 8.7 (L) 8.9 - 10.3 mg/dL    GFR, Estimated >66 >44 mL/min      Comment: (NOTE) Calculated using the CKD-EPI Creatinine Equation (2021)      Anion gap 6 5 - 15      Comment: Performed at [facility], [address] [place], [place] [address]       Imaging Results (Last 48 hours)  ECHOCARDIOGRAM COMPLETE   Result Date: 11/28/2022    ECHOCARDIOGRAM REPORT   Patient Name:   Maurice Munoz Date of Exam: 11/28/2022 Medical Rec #:  [id number]        Height:       73.0 in Accession #:    [id number]       Weight:       191.6 lb Date of Birth:  05/29/1946        BSA:          2.113 m Patient Age:    76 years         BP:           125/74 mmHg Patient Gender: M                HR:           67 bpm. Exam Location:  Inpatient Procedure: 2D Echo, Color Doppler and Cardiac Doppler Indications:    Pulmonary hypertension I27.2  History:  Patient has prior history of Echocardiogram examinations, most                 recent 06/30/2022. CAD and Previous Myocardial Infarction, Prior                 Cardiac Surgery, CKD and Pulmonary HTN; Risk                 Factors:Hypertension, Former Smoker and Dyslipidemia.  Sonographer:    Dondra Prader RVT RCS Referring Phys: [id number] DAWO[address] S ELGERGAWY   Sonographer Comments: Technically challenging study due to limited acoustic windows, Technically difficult study due to poor echo windows, suboptimal parasternal window, suboptimal apical window and suboptimal subcostal window. Definity deemed inappropriate due to limited to no windows. Patient unable to reposition IMPRESSIONS  1. LV poorly visualized. Grossly LV function appears to be normal, roughly 55-60%. . Left ventricular ejection fraction, by estimation, is 55 to 60%. The left ventricle has normal function. Left ventricular endocardial border not optimally defined to evaluate regional wall motion. Left ventricular diastolic parameters are indeterminate.  2. Ventricular septum is flattened in diastole suggesting RV pressure overload. . Right ventricular systolic function moderately to severely reduced. The right ventricular size is moderately to severely dilated. Tricuspid regurgitation signal is inadequate for assessing PA pressure.  3. Right atrial size was severely dilated.  4. The mitral valve was not well visualized. No evidence of mitral valve regurgitation. No evidence of mitral stenosis.  5. The aortic valve was not well visualized. Aortic valve regurgitation is not visualized. No aortic stenosis is present.  6. The inferior vena cava is normal in size with greater than 50% respiratory variability, suggesting right atrial pressure of 3 mmHg. FINDINGS  Left Ventricle: LV poorly visualized. Grossly LV function appears to be normal, roughly 55-60%. Left ventricular ejection fraction, by estimation, is 55 to 60%. The left ventricle has normal function. Left ventricular endocardial border not optimally defined to evaluate regional wall motion. The left ventricular internal cavity size was normal in size. There is no left ventricular hypertrophy. Left ventricular diastolic parameters are indeterminate. Right Ventricle: Ventricular septum is flattened in diastole suggesting RV pressure overload. The right  ventricular size is moderately to severely dilated. Right vetricular wall thickness was not well visualized. Right ventricular systolic function moderately to severely reduced. Tricuspid regurgitation signal is inadequate for assessing PA pressure. Left Atrium: Left atrial size was not well visualized. Right Atrium: Right atrial size was severely dilated. Pericardium: There is no evidence of pericardial effusion. Mitral Valve: The mitral valve was not well visualized. No evidence of mitral valve regurgitation. No evidence of mitral valve stenosis. Tricuspid Valve: The tricuspid valve is not well visualized. Tricuspid valve regurgitation is trivial. No evidence of tricuspid stenosis. Aortic Valve: The aortic valve was not well visualized. Aortic valve regurgitation is not visualized. No aortic stenosis is present. Aortic valve mean gradient measures 2.0 mmHg. Aortic valve peak gradient measures 3.6 mmHg. Pulmonic Valve: The pulmonic valve was not well visualized. Pulmonic valve regurgitation is not visualized. No evidence of pulmonic stenosis. Aorta: The aortic root is normal in size and structure. Venous: The inferior vena cava is normal in size with greater than 50% respiratory variability, suggesting right atrial pressure of 3 mmHg. IAS/Shunts: No atrial level shunt detected by color flow Doppler.  LEFT VENTRICLE PLAX 2D LVIDd:         3.80 cm LVIDs:         2.80 cm LV PW:  1.00 cm LV IVS:        0.80 cm  RIGHT VENTRICLE            IVC RV Basal diam:  4.90 cm    IVC diam: 1.60 cm RV S prime:     6.18 cm/s LEFT ATRIUM           Index        RIGHT ATRIUM           Index LA diam:      3.40 cm 1.61 cm/m   RA Area:     27.10 cm LA Vol (A4C): 43.4 ml 20.54 ml/m  RA Volume:   97.90 ml  46.34 ml/m  AORTIC VALVE                   PULMONIC VALVE AV Vmax:           94.30 cm/s  PV Vmax:       0.71 m/s AV Vmean:          62.900 cm/s PV Peak grad:  2.0 mmHg AV VTI:            0.202 m AV Peak Grad:      3.6 mmHg AV  Mean Grad:      2.0 mmHg LVOT Vmax:         72.20 cm/s LVOT Vmean:        44.700 cm/s LVOT VTI:          0.154 m LVOT/AV VTI ratio: 0.76  AORTA Ao Root diam: 3.00 cm MITRAL VALVE MV Area (PHT): 3.76 cm    SHUNTS MV Decel Time: 202 msec    Systemic VTI: 0.15 m MV E velocity: 50.50 cm/s MV A velocity: 48.40 cm/s MV E/A ratio:  1.04 Dina Rich MD Electronically signed by Dina Rich MD Signature Date/Time: 11/28/2022/12:58:19 PM    Final            Blood pressure 125/68, pulse 70, temperature 97.8 F (36.6 C), temperature source Oral, resp. rate 17, height 6\' 1"  (1.854 m), weight 85.2 kg, SpO2 99 %.   Medical Problem List and Plan: 1. Functional deficits secondary to significant disability related to acute respiratory failure.              -patient may shower (cover right wrist )             -ELOS/Goals: 10-12 days with mod I goals   2.  Antithrombotics: -DVT/anticoagulation:  Pharmaceutical: Heparin             -antiplatelet therapy: Aspirin 81 mg daily, Plavix   3. Pain Management: Tylenol as needed             -Zanaflex 4 mg q HS             -right knee pain after recent fall. May have contused patella or strained patella tendon -pt reports pain is beginning to slightly improve -will check knee XR to rule out fx -may use ice as needed 4. Mood/Behavior/Sleep: LCSW to evaluate and provide emotional support             -antipsychotic agents: n/a   5. Neuropsych/cognition: This patient is capable of making decisions on his own behalf.   6. Skin/Wound Care: Routine skin care checks             -monitor multiple skin tears             -local wound  care to each area.              -pad contact areas as needed for protection   7. Fluids/Electrolytes/Nutrition: Strict Is and Os and follow-up chemistries             -continue vitamin D, B12             -Heart Healthy strict 1.5 L fluid restriction per day    8: Hypertension: monitor TID and prn (?? home amlodipine and lisinopril  held)             -continue Imdur 30 mh q AM (?? home 60 mg AM, 30 mg PM)             -continue Lopressor 25 mg BID (?? 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 I  messaged rehab pharmacist with regards to prednisone orders. (?? home prednisone 20 mg q AM) -continue pirfenidone -will need to establish new baseline oxygen requirement at rest and with exertion. Observe with therapy.    12: Pulmonary hypertension/right heart failure: (?? home torsemide held)             -daily weights             -currently no diuretics (states torsemide gives him hand cramps)   13: AKI atop CKD stage IIIb: current Cr 1.15             -follow-up BMP   14: CAD: continue aspirin, Plavix, statin             -follows with Dr. Tresa Endo   15: Carotid stenosis: follow-up outpatient     Milinda Antis, PA-C 11/30/2022  I have personally performed a face to face diagnostic evaluation of this patient and formulated the key components of the plan.  Additionally, I have personally reviewed laboratory data, imaging studies, as well as relevant notes and concur with the physician assistant's documentation above.  The patient's status has not changed from the original H&P.  Any changes in documentation from the acute care chart have been noted above.  Ranelle Oyster, MD, [place] [facility]

## 2022-11-30 NOTE — Plan of Care (Signed)
Wound Plan  Wounds present: 10 wounds Wound type: patient sustained multiple falls, presents with multiple abrasions and skin tears over the UE/head/face/LEs Pressure Injury POA: NA Measurement: see nursing notes Wound UJW:JXBJ tears Drainage (amount, consistency, odor) see nursing notes Periwound:frail skin  Braden Score: 16  Sensory: 3 Moisture: 3 Activity: 2 Mobility: 3 Nutrition: 3 Friction: 2  Interventions: Using the nursing skin care order set. Change all skin tears and abrasions to be covered with single layer of xeroform gauze [employer] [place] # [phone]). Top with silicone foam or secure with kerlix, change every 3 days   NO TAPE directly on patient's skin    Ensure enlive  HH diet/1200 CC FR Vitamin D and Vitamin B 12  Contributors: Melody Engineer, manufacturing systems, RN,CWOCN, CNS, CWON-AP  Chana Bode, RN, BSN, RN-BC, CRRN

## 2022-11-30 NOTE — Progress Notes (Signed)
PT Cancellation Note  Patient Details Name: Maurice Munoz MRN: [id number] DOB: 1946-05-25   Cancelled Treatment:    Reason Eval/Treat Not Completed: (P) Other (comment), pt d/cing to CIR immanently.   Lenora Boys. PTA Acute Rehabilitation Services Office: [phone]    [name] 11/30/2022, 3:30 PM

## 2022-11-30 NOTE — H&P (Signed)
Physical Medicine and Rehabilitation Admission H&P   CC: Functional deficits secondary to acute on chronic hypoxemic respiratory failure   HPI: Maurice Munoz is a 77 year old male with a past medical history of interstitial lung disease dating back to at least 2016 who presented to the emergency department at [facility] on 11/26/2022 with recurrent syncope and worsening dyspnea on exertion.  The patient reported that he did not check his oxygen frequently at home and was not wearing his oxygen as prescribed or recommended.  As part of his workup he underwent CTA of head and neck with evidence of incidental complete right ICA occlusion.  CTA with PE protocol was negative for PE but did demonstrate severe IPF and honeycombing, interlobar septal thickening and diffuse central groundglass opacities with bilateral pleural effusions.  Felt to be concerning for pulmonary edema and cardiogenic volume overload.  He was transferred from [facility] to [facility] for further evaluation.  MRI brain non-acute. Pulmonology service was consulted.  BNP was 2,446.  Aggressive diuresis of at least 1 L daily was recommended with close monitoring of blood pressure and kidney function.  O2 saturation goal of 88%'s weaning as tolerated.  He was started on IV Solu-Medrol for 3 days and will continue with prednisone taper.  TTE was updated. EF ~55-60%.  He is tolerating a heart healthy diet.  BUN today elevated at 38 creatinine normal at 1.15.  Urine potassium and magnesium have been normal.  BNP 1098.  Cytosis continues to improve.  Mild thrombocytopenia.  Oxygen saturation levels have been normal on 3 L oxygen via nasal cannula. The patient requires inpatient medicine and rehabilitation evaluations and services for ongoing dysfunction secondary to acute on chronic hypoxemic respiratory failure.  Recall history significant for PCI x 5 and demonstrated nonobstructive CAD.  Demonstration of pulmonary hypertension with PVR of 6.89,  LVEDP of 15.  He was prescribed Lasix.  He follows with Dr. Tresa Endo.  Has had multiple spine surgeries, non-healing pilonidal cyst with chronic pain. No tobacco or alcohol use. Lives alone and has friends who provide support as well as several step-children.   Review of Systems  Constitutional:  Negative for chills and fever.  HENT:  Negative for congestion and sore throat.   Eyes:  Negative for blurred vision and double vision.  Respiratory:  Positive for cough and shortness of breath.   Cardiovascular:  Negative for chest pain and palpitations.  Gastrointestinal:  Negative for nausea and vomiting.  Genitourinary:  Negative for dysuria, hematuria and urgency.  Musculoskeletal:  Positive for back pain and neck pain.       Chronic back/neck pain; uses Percocet as needed  Neurological:  Negative for dizziness and headaches.  Psychiatric/Behavioral:  Negative for depression. The patient does not have insomnia.    Past Medical History:  Diagnosis Date   Adrenal adenoma, left    small   Aortic atherosclerosis (HCC)    Back pain    Bilateral carotid artery stenosis followed by dr Tresa Endo   per last carotid duplex 07-17-2014  -- proximal LICA 50-69% ,  RICA 0-49%   CAD (coronary artery disease)    a. s/p prior intervention to RCA and PDA in 1999 b. DES to LAD and distal RCA in 2007   Cardiomegaly    Stable   Chronic constipation    Chronic pain syndrome    CKD (chronic kidney disease), stage III (HCC)    DDD (degenerative disc disease), cervical    DDD (degenerative disc disease), lumbosacral  Dyspnea    Dyspnea on exertion    Gout    per pt last inflared episode 2015 approx.   Grade II diastolic dysfunction    H/O epistaxis    per pt sees ENT dr Suszanne Conners for cauterization (pt takes plavix)   History of acute pancreatitis 08/08/2014   History of kidney stones    HTN (hypertension)    Hyperlipidemia    Interstitial lung disease (HCC)    Left arm pain    s/p fall   Nocturia     Numbness of right foot    due to DDD lumbar   Peripheral edema    Pilonidal cyst    Pleural effusion on right    Pulmonary fibrosis (HCC)    followed by pcp-- last chest CT 04-08-2016   S/P drug eluting coronary stent placement 10/11/2005   mLAD and dRCA   Wears dentures    upper   Wears glasses    Past Surgical History:  Procedure Laterality Date   ANTERIOR CERVICAL DECOMP/DISCECTOMY FUSION  12-25-2001     dr Terrilee Files [facility]   C3 -- C7   ANTERIOR CERVICAL DECOMP/DISCECTOMY FUSION  12-11-2012   dr Channing Mutters  [facility])   CARDIOVASCULAR STRESS TEST  08-23-2012    dr Tresa Endo   normal lexiscan nuclear study w/ no ischemia/  normal LV function and wall motion, ef 65%   CARDIOVASCULAR STRESS TEST     CORONARY ANGIOPLASTY  1999   PTCA to RCA & PDA   CORONARY ANGIOPLASTY WITH STENT PLACEMENT  10-11-2005  dr [name]   PCI and stenting to midLAD & dRCA (Cypher DES x2)   CORONARY BALLOON ANGIOPLASTY N/A 04/14/2018   Procedure: CORONARY BALLOON ANGIOPLASTY;  Surgeon: Lennette Bihari, MD;  Location: [facility] INVASIVE CV LAB;  Service: Cardiovascular;  Laterality: N/A;   CORONARY STENT INTERVENTION N/A 04/14/2018   Procedure: CORONARY STENT INTERVENTION;  Surgeon: Lennette Bihari, MD;  Location: [facility] INVASIVE CV LAB;  Service: Cardiovascular;  Laterality: N/A;   CYSTOSCOPY W/ URETERAL STENT PLACEMENT Left 02-11-2010     [facility]   LEFT HEART CATH N/A 04/14/2018   Procedure: Left Heart Cath;  Surgeon: Lennette Bihari, MD;  Location: [facility] INVASIVE CV LAB;  Service: Cardiovascular;  Laterality: N/A;   LEFT HEART CATH AND CORONARY ANGIOGRAPHY N/A 04/12/2018   Procedure: LEFT HEART CATH AND CORONARY ANGIOGRAPHY;  Surgeon: Corky Crafts, MD;  Location: [facility] INVASIVE CV LAB;  Service: Cardiovascular;  Laterality: N/A;   LUMBAR LAMINECTOMY  2015   L5 -- S1   PILONIDAL CYST EXCISION  1980s   PILONIDAL CYST EXCISION N/A 03/31/2017   Procedure: EXCISION OF PILONIDAL DISEASE with Flap Rotation;  Surgeon:  Romie Levee, MD;  Location: [facility];  Service: General;  Laterality: N/A;   RIGHT/LEFT HEART CATH AND CORONARY ANGIOGRAPHY N/A 10/18/2022   Procedure: RIGHT/LEFT HEART CATH AND CORONARY ANGIOGRAPHY;  Surgeon: Lennette Bihari, MD;  Location: [facility] INVASIVE CV LAB;  Service: Cardiovascular;  Laterality: N/A;   THROAT SURGERY  1996   per pt "removal piece of cryloid(?) that was pressing against throat"  states no issues since   TRANSTHORACIC ECHOCARDIOGRAM  08-24-2010  dr Tresa Endo   ef 50-55%/  mild MR/  trivial TR   WOUND DEBRIDEMENT N/A 10/06/2017   Procedure: DEBRIDEMENT WOUND PLACEMENT OF WOUND HEALING MATRIX;  Surgeon: Romie Levee, MD;  Location: [facility];  Service: General;  Laterality: N/A;   Family History  Problem Relation  Age of Onset   Cancer Father    Heart attack Brother    Diabetes Brother    Colon cancer Neg Hx    Social History:  reports that he quit smoking about 34 years ago. His smoking use included cigarettes. He has a 75.00 pack-year smoking history. He quit smokeless tobacco use about 33 years ago. He reports that he does not drink alcohol and does not use drugs. Allergies:  Allergies  Allergen Reactions   Ibuprofen Itching    Motrin brand   Neosporin [Neomycin-Bacitracin Zn-Polymyx] Swelling   Penicillins Rash     Has patient had a PCN reaction causing immediate rash, facial/tongue/throat swelling, SOB or lightheadedness with hypotension: No Has patient had a PCN reaction causing severe rash involving mucus membranes or skin necrosis: No Has patient had a PCN reaction that required hospitalization: No Has patient had a PCN reaction occurring within the last 10 years: No If all of the above answers are "NO", then may proceed with Cephalosporin use.    Facility-Administered Medications Prior to Admission  Medication Dose Route Frequency Provider Last Rate Last Admin   sodium chloride flush (NS) 0.9 % injection 3 mL  3 mL  Intravenous Q12H Lennette Bihari, MD       Medications Prior to Admission  Medication Sig Dispense Refill   allopurinol (ZYLOPRIM) 300 MG tablet Take 300 mg by mouth in the morning.     amLODipine (NORVASC) 5 MG tablet Take 1.5 tablets (7.5 mg total) by mouth daily. (Patient taking differently: Take 5 mg by mouth daily.) 90 tablet 2   aspirin EC 81 MG tablet Take 81 mg by mouth in the morning.     atorvastatin (LIPITOR) 80 MG tablet TAKE ONE TABLET (80MG  TOTAL) BY MOUTH AT6PM (Patient taking differently: Take 80 mg by mouth daily.) 90 tablet 3   clopidogrel (PLAVIX) 75 MG tablet TAKE ONE (1) TABLET BY MOUTH EVERY DAY 90 tablet 2   cyanocobalamin (VITAMIN B12) 1000 MCG tablet Take 1,000 mcg by mouth in the morning.     ESBRIET 801 MG TABS Take 1 tablet (801 mg total) by mouth 3 (three) times daily with meals. (Patient taking differently: Take 1 tablet by mouth See admin instructions. Take 1 tablet (801 mg) by mouth up to 3 times daily with meals.) 270 tablet 1   isosorbide mononitrate (IMDUR) 60 MG 24 hr tablet Take 60 mg  ( 1 tablet)  in the morning and  30 mg ( 1/2 tablet )  in the evening (Patient taking differently: Take 30-60 mg by mouth See admin instructions. Take 60 mg  ( 1 tablet)  in the morning and  30 mg ( 1/2 tablet )  in the evening) 135 tablet 3   lisinopril (ZESTRIL) 2.5 MG tablet TAKE ONE TABLET (2.5MG  TOTAL) BY MOUTH DAILY (Patient taking differently: Take 2.5 mg by mouth daily.) 90 tablet 1   metoprolol tartrate (LOPRESSOR) 50 MG tablet Take 2 tablets (100 mg total) by mouth every morning AND 1.5 tablets (75 mg total) every evening. 270 tablet 3   nitroGLYCERIN (NITROSTAT) 0.4 MG SL tablet Place 1 tablet (0.4 mg total) under the tongue every 5 (five) minutes as needed for chest pain. 25 tablet 3   oxyCODONE-acetaminophen (PERCOCET) 10-325 MG per tablet Take 1 tablet by mouth every 6 (six) hours as needed for pain.     tiZANidine (ZANAFLEX) 4 MG tablet Take 4 mg by mouth at bedtime.       VITAMIN D  PO Take 5,000 Units by mouth in the morning.     [DISCONTINUED] predniSONE (DELTASONE) 10 MG tablet Take 10-60 mg by mouth daily as needed (gout flares).     [DISCONTINUED] torsemide (DEMADEX) 20 MG tablet TAKE ONE TABLET (20MG  TOTAL) BY MOUTH DAILY. IF ADDITIONAL SWELLING, MAY TAKE AN ADDITIONAL DOSE IN THE AFTERNOON (Patient taking differently: Take 20 mg by mouth 2 (two) times a week. May take an additional dose the afternoon) 180 tablet 1   acetaminophen (TYLENOL) 500 MG tablet Take 1,000 mg by mouth every 6 (six) hours as needed (pain.).     benzonatate (TESSALON) 200 MG capsule TAKE ONE CAPSULE (200MG  TOTAL) BY MOUTH TWO TIMES DAILY AS NEEDED FOR COUGH (Patient not taking: Reported on 11/26/2022) 60 capsule 2   doxycycline (VIBRAMYCIN) 100 MG capsule Take 100 mg by mouth 2 (two) times daily.     OXYGEN Inhale 4-5 L into the lungs as needed (respiratory issues/with activity).        Home: Home Living Family/patient expects to be discharged to:: Private residence Living Arrangements: Alone Available Help at Discharge: Family, Friend(s), Available 24 hours/day Type of Home: House Home Access: Stairs to enter [employer] of Steps: 1 threshold Entrance Stairs-Rails: None Home Layout: One level Bathroom Shower/Tub: Health visitor: Handicapped height Bathroom Accessibility: Yes Home Equipment: Art gallery manager, Agricultural consultant (2 wheels), [employer] - single point, Information systems manager - built in, [employer] - tub/shower, Hand held shower head   Functional History: Prior Function Prior Level of Function : Independent/Modified Independent, History of Falls (last six months), Driving Mobility Comments: RW in the house, has electric scooter he uses for community distances, 1-2 falls over the past 6 months besides the ones causing admission ADLs Comments: independent.  Brother assist with iADL and brings him his groceries.  Functional Status:  Mobility: Bed  Mobility Overal bed mobility: Needs Assistance Bed Mobility: Sit to Supine Supine to sit: Min assist, HOB elevated Sit to supine: Min guard General bed mobility comments: pt up in chair on arrival Transfers Overall transfer level: Needs assistance Equipment used: Rolling walker (2 wheels) Transfers: Sit to/from Stand, Bed to chair/wheelchair/BSC Sit to Stand: Min assist Bed to/from chair/wheelchair/BSC transfer type:: Stand pivot Stand pivot transfers: Min assist General transfer comment: min A to power up from recliner with pt needing increased time to eleavte trunk to full upright standing Ambulation/Gait General Gait Details: not attempted today due to BP Pre-gait activities: standing marching x10    ADL: ADL Overall ADL's : Needs assistance/impaired Eating/Feeding: Independent, Sitting Grooming: Wash/dry hands, Wash/dry face, Set up, Sitting Upper Body Bathing: Minimal assistance, Sitting Lower Body Bathing: Minimal assistance, Sit to/from stand Upper Body Dressing : Minimal assistance, Moderate assistance, Sitting Lower Body Dressing: Minimal assistance, Sit to/from stand Toilet Transfer: Minimal assistance, Stand-pivot, BSC/3in1  Cognition: Cognition Overall Cognitive Status: Within Functional Limits for tasks assessed Orientation Level: Oriented X4 Cognition Arousal/Alertness: Awake/alert Behavior During Therapy: WFL for tasks assessed/performed Overall Cognitive Status: Within Functional Limits for tasks assessed General Comments: A&Ox4, pleasant throughout session  Physical Exam: Blood pressure 125/68, pulse 70, temperature 97.8 F (36.6 C), temperature source Oral, resp. rate 17, height 6\' 1"  (1.854 m), weight 85.2 kg, SpO2 99 %. Physical Exam Constitutional:      General: He is not in acute distress.    Comments: Multiple bandages  HENT:     Head:     Comments: Abrasions on forehead, nose    Right Ear: External ear normal.  Left Ear: External ear  normal.  Eyes:     Extraocular Movements: Extraocular movements intact.     Conjunctiva/sclera: Conjunctivae normal.     Pupils: Pupils are equal, round, and reactive to light.  Cardiovascular:     Rate and Rhythm: Normal rate and regular rhythm.     Heart sounds: No murmur heard. Pulmonary:     Effort: Pulmonary effort is normal. No respiratory distress.     Breath sounds: No wheezing or rales.     Comments: O2 [place] Abdominal:     General: There is no distension.     Palpations: Abdomen is soft.     Tenderness: There is no abdominal tenderness.  Musculoskeletal:     Cervical back: No rigidity or tenderness.     Right lower leg: Edema present.     Left lower leg: Edema present.     Comments: Pt demonstrated pain in the right patella and patellar tendon with palpation and with bending near 90 degrees.   Skin:    Comments: Head and nose wounds as above. Large wound at right wrist with dressing saturated with old blood. Smaller left wrist wound. Superficial quarter sized wounds on each inferior patella area. Bruising left lateral foot.   Neurological:     Comments: Alert and oriented x 3. Normal insight and awareness. Intact Memory. Normal language and speech. Cranial nerve exam unremarkable. MMT: 4/5 BUE with limitations distally due to dressings/wrist wounds. BLE 3+/5 HF, KE, 4/5 ADF/PF. No gross sensory deficits appreciated. Normal tone.   Psychiatric:        Mood and Affect: Mood normal.        Behavior: Behavior normal.     Results for orders placed or performed during the hospital encounter of 11/26/22 (from the past 48 hour(s))  Magnesium     Status: None   Collection Time: 11/29/22  4:20 AM  Result Value Ref Range   Magnesium 2.0 1.7 - 2.4 mg/dL    Comment: Performed at [facility], [address] [place], [place] [address]  Procalcitonin     Status: None   Collection Time: 11/29/22  4:20 AM  Result Value Ref Range   Procalcitonin <0.10 ng/mL    Comment:         Interpretation: PCT (Procalcitonin) <= 0.5 ng/mL: Systemic infection (sepsis) is not likely. Local bacterial infection is possible. (NOTE)       Sepsis PCT Algorithm           Lower Respiratory Tract                                      Infection PCT Algorithm    ----------------------------     ----------------------------         PCT < 0.25 ng/mL                PCT < 0.10 ng/mL          Strongly encourage             Strongly discourage   discontinuation of antibiotics    initiation of antibiotics    ----------------------------     -----------------------------       PCT 0.25 - 0.50 ng/mL            PCT 0.10 - 0.25 ng/mL               OR       >  80% decrease in PCT            Discourage initiation of                                            antibiotics      Encourage discontinuation           of antibiotics    ----------------------------     -----------------------------         PCT >= 0.50 ng/mL              PCT 0.26 - 0.50 ng/mL               AND        <80% decrease in PCT             Encourage initiation of                                             antibiotics       Encourage continuation           of antibiotics    ----------------------------     -----------------------------        PCT >= 0.50 ng/mL                  PCT > 0.50 ng/mL               AND         increase in PCT                  Strongly encourage                                      initiation of antibiotics    Strongly encourage escalation           of antibiotics                                     -----------------------------                                           PCT <= 0.25 ng/mL                                                 OR                                        > 80% decrease in PCT                                      Discontinue / Do not initiate  antibiotics  Performed at [facility], [address], [place], [place] [address]    C-reactive protein     Status: Abnormal   Collection Time: 11/29/22  4:20 AM  Result Value Ref Range   CRP 1.3 (H) <1.0 mg/dL    Comment: Performed at [facility], [address] [place], [place] [address]  CBC with Differential/Platelet     Status: Abnormal   Collection Time: 11/29/22  4:20 AM  Result Value Ref Range   WBC 12.8 (H) 4.0 - 10.5 K/uL   RBC 4.15 (L) 4.22 - 5.81 MIL/uL   Hemoglobin 13.0 13.0 - 17.0 g/dL   HCT 09.8 11.9 - 14.7 %   MCV 95.2 80.0 - 100.0 fL   MCH 31.3 26.0 - 34.0 pg   MCHC 32.9 30.0 - 36.0 g/dL   RDW 82.9 56.2 - 13.0 %   Platelets 127 (L) 150 - 400 K/uL    Comment: REPEATED TO VERIFY   nRBC 0.0 0.0 - 0.2 %   Neutrophils Relative % 88 %   Neutro Abs 11.2 (H) 1.7 - 7.7 K/uL   Lymphocytes Relative 6 %   Lymphs Abs 0.8 0.7 - 4.0 K/uL   Monocytes Relative 5 %   Monocytes Absolute 0.7 0.1 - 1.0 K/uL   Eosinophils Relative 0 %   Eosinophils Absolute 0.0 0.0 - 0.5 K/uL   Basophils Relative 0 %   Basophils Absolute 0.0 0.0 - 0.1 K/uL   Immature Granulocytes 1 %   Abs Immature Granulocytes 0.11 (H) 0.00 - 0.07 K/uL    Comment: Performed at [facility], [address] [place], [place] [address]  Brain natriuretic peptide     Status: Abnormal   Collection Time: 11/29/22  4:20 AM  Result Value Ref Range   B Natriuretic Peptide 1,203.5 (H) 0.0 - 100.0 pg/mL    Comment: Performed at [facility], [address], [place], [place] [address]  Basic metabolic panel     Status: Abnormal   Collection Time: 11/29/22  4:20 AM  Result Value Ref Range   Sodium 136 135 - 145 mmol/L   Potassium 4.3 3.5 - 5.1 mmol/L   Chloride 97 (L) 98 - 111 mmol/L   CO2 31 22 - 32 mmol/L   Glucose, Bld 131 (H) 70 - 99 mg/dL    Comment: Glucose reference range applies only to samples taken after fasting for at least 8 hours.   BUN 40 (H) 8 - 23 mg/dL   Creatinine, Ser 9.52 (H) 0.61 - 1.24 mg/dL   Calcium 8.6 (L) 8.9 - 10.3 mg/dL   GFR, Estimated 56 (L) >60  mL/min    Comment: (NOTE) Calculated using the CKD-EPI Creatinine Equation (2021)    Anion gap 8 5 - 15    Comment: Performed at [facility], [address], [place], [place] [address]  Magnesium     Status: None   Collection Time: 11/30/22  4:04 AM  Result Value Ref Range   Magnesium 2.2 1.7 - 2.4 mg/dL    Comment: Performed at [facility], [address] [place], [place] [address]  Procalcitonin     Status: None   Collection Time: 11/30/22  4:04 AM  Result Value Ref Range   Procalcitonin <0.10 ng/mL    Comment:        Interpretation: PCT (Procalcitonin) <= 0.5 ng/mL: Systemic infection (sepsis) is not likely. Local bacterial infection is possible. (NOTE)  Sepsis PCT Algorithm           Lower Respiratory Tract                                      Infection PCT Algorithm    ----------------------------     ----------------------------         PCT < 0.25 ng/mL                PCT < 0.10 ng/mL          Strongly encourage             Strongly discourage   discontinuation of antibiotics    initiation of antibiotics    ----------------------------     -----------------------------       PCT 0.25 - 0.50 ng/mL            PCT 0.10 - 0.25 ng/mL               OR       >80% decrease in PCT            Discourage initiation of                                            antibiotics      Encourage discontinuation           of antibiotics    ----------------------------     -----------------------------         PCT >= 0.50 ng/mL              PCT 0.26 - 0.50 ng/mL               AND        <80% decrease in PCT             Encourage initiation of                                             antibiotics       Encourage continuation           of antibiotics    ----------------------------     -----------------------------        PCT >= 0.50 ng/mL                  PCT > 0.50 ng/mL               AND         increase in PCT                  Strongly encourage                                       initiation of antibiotics    Strongly encourage escalation           of antibiotics                                     -----------------------------  PCT <= 0.25 ng/mL                                                 OR                                        > 80% decrease in PCT                                      Discontinue / Do not initiate                                             antibiotics  Performed at [facility], [address], [place], [place] [address]   C-reactive protein     Status: None   Collection Time: 11/30/22  4:04 AM  Result Value Ref Range   CRP 0.7 <1.0 mg/dL    Comment: Performed at [facility], [address] [place], [place] [address]  CBC with Differential/Platelet     Status: Abnormal   Collection Time: 11/30/22  4:04 AM  Result Value Ref Range   WBC 12.1 (H) 4.0 - 10.5 K/uL   RBC 4.40 4.22 - 5.81 MIL/uL   Hemoglobin 13.2 13.0 - 17.0 g/dL   HCT 09.8 11.9 - 14.7 %   MCV 92.5 80.0 - 100.0 fL   MCH 30.0 26.0 - 34.0 pg   MCHC 32.4 30.0 - 36.0 g/dL   RDW 82.9 56.2 - 13.0 %   Platelets 148 (L) 150 - 400 K/uL   nRBC 0.0 0.0 - 0.2 %   Neutrophils Relative % 87 %   Neutro Abs 10.5 (H) 1.7 - 7.7 K/uL   Lymphocytes Relative 7 %   Lymphs Abs 0.9 0.7 - 4.0 K/uL   Monocytes Relative 5 %   Monocytes Absolute 0.6 0.1 - 1.0 K/uL   Eosinophils Relative 0 %   Eosinophils Absolute 0.0 0.0 - 0.5 K/uL   Basophils Relative 0 %   Basophils Absolute 0.0 0.0 - 0.1 K/uL   Immature Granulocytes 1 %   Abs Immature Granulocytes 0.10 (H) 0.00 - 0.07 K/uL    Comment: Performed at [facility], [address] [place], [place] [address]  Brain natriuretic peptide     Status: Abnormal   Collection Time: 11/30/22  4:04 AM  Result Value Ref Range   B Natriuretic Peptide 1,098.7 (H) 0.0 - 100.0 pg/mL    Comment: Performed at [facility], [address] [place], [place] [address]   Basic metabolic panel     Status: Abnormal   Collection Time: 11/30/22  4:04 AM  Result Value Ref Range   Sodium 137 135 - 145 mmol/L   Potassium 4.3 3.5 - 5.1 mmol/L   Chloride 99 98 - 111 mmol/L   CO2 32 22 - 32 mmol/L   Glucose, Bld 113 (H) 70 - 99 mg/dL    Comment: Glucose reference range applies only to samples taken after fasting for at least 8 hours.   BUN 38 (  H) 8 - 23 mg/dL   Creatinine, Ser 1.61 0.61 - 1.24 mg/dL   Calcium 8.7 (L) 8.9 - 10.3 mg/dL   GFR, Estimated >09 >60 mL/min    Comment: (NOTE) Calculated using the CKD-EPI Creatinine Equation (2021)    Anion gap 6 5 - 15    Comment: Performed at [facility], [address], [place], [place] [address]   ECHOCARDIOGRAM COMPLETE  Result Date: 11/28/2022    ECHOCARDIOGRAM REPORT   Patient Name:   ELZO KNUPP Date of Exam: 11/28/2022 Medical Rec #:  [id number]        Height:       73.0 in Accession #:    [id number]       Weight:       191.6 lb Date of Birth:  1945/09/21        BSA:          2.113 m Patient Age:    76 years         BP:           125/74 mmHg Patient Gender: M                HR:           67 bpm. Exam Location:  Inpatient Procedure: 2D Echo, Color Doppler and Cardiac Doppler Indications:    Pulmonary hypertension I27.2  History:        Patient has prior history of Echocardiogram examinations, most                 recent 06/30/2022. CAD and Previous Myocardial Infarction, Prior                 Cardiac Surgery, CKD and Pulmonary HTN; Risk                 Factors:Hypertension, Former Smoker and Dyslipidemia.  Sonographer:    Dondra Prader RVT RCS Referring Phys: [id number] [address] S ELGERGAWY  Sonographer Comments: Technically challenging study due to limited acoustic windows, Technically difficult study due to poor echo windows, suboptimal parasternal window, suboptimal apical window and suboptimal subcostal window. Definity deemed inappropriate due to limited to no windows. Patient unable to reposition IMPRESSIONS  1. LV  poorly visualized. Grossly LV function appears to be normal, roughly 55-60%. . Left ventricular ejection fraction, by estimation, is 55 to 60%. The left ventricle has normal function. Left ventricular endocardial border not optimally defined to evaluate regional wall motion. Left ventricular diastolic parameters are indeterminate.  2. Ventricular septum is flattened in diastole suggesting RV pressure overload. . Right ventricular systolic function moderately to severely reduced. The right ventricular size is moderately to severely dilated. Tricuspid regurgitation signal is inadequate for assessing PA pressure.  3. Right atrial size was severely dilated.  4. The mitral valve was not well visualized. No evidence of mitral valve regurgitation. No evidence of mitral stenosis.  5. The aortic valve was not well visualized. Aortic valve regurgitation is not visualized. No aortic stenosis is present.  6. The inferior vena cava is normal in size with greater than 50% respiratory variability, suggesting right atrial pressure of 3 mmHg. FINDINGS  Left Ventricle: LV poorly visualized. Grossly LV function appears to be normal, roughly 55-60%. Left ventricular ejection fraction, by estimation, is 55 to 60%. The left ventricle has normal function. Left ventricular endocardial border not optimally defined to evaluate regional wall motion. The left ventricular internal cavity size was normal in size. There is no left ventricular  hypertrophy. Left ventricular diastolic parameters are indeterminate. Right Ventricle: Ventricular septum is flattened in diastole suggesting RV pressure overload. The right ventricular size is moderately to severely dilated. Right vetricular wall thickness was not well visualized. Right ventricular systolic function moderately to severely reduced. Tricuspid regurgitation signal is inadequate for assessing PA pressure. Left Atrium: Left atrial size was not well visualized. Right Atrium: Right atrial size was  severely dilated. Pericardium: There is no evidence of pericardial effusion. Mitral Valve: The mitral valve was not well visualized. No evidence of mitral valve regurgitation. No evidence of mitral valve stenosis. Tricuspid Valve: The tricuspid valve is not well visualized. Tricuspid valve regurgitation is trivial. No evidence of tricuspid stenosis. Aortic Valve: The aortic valve was not well visualized. Aortic valve regurgitation is not visualized. No aortic stenosis is present. Aortic valve mean gradient measures 2.0 mmHg. Aortic valve peak gradient measures 3.6 mmHg. Pulmonic Valve: The pulmonic valve was not well visualized. Pulmonic valve regurgitation is not visualized. No evidence of pulmonic stenosis. Aorta: The aortic root is normal in size and structure. Venous: The inferior vena cava is normal in size with greater than 50% respiratory variability, suggesting right atrial pressure of 3 mmHg. IAS/Shunts: No atrial level shunt detected by color flow Doppler.  LEFT VENTRICLE PLAX 2D LVIDd:         3.80 cm LVIDs:         2.80 cm LV PW:         1.00 cm LV IVS:        0.80 cm  RIGHT VENTRICLE            IVC RV Basal diam:  4.90 cm    IVC diam: 1.60 cm RV S prime:     6.18 cm/s LEFT ATRIUM           Index        RIGHT ATRIUM           Index LA diam:      3.40 cm 1.61 cm/m   RA Area:     27.10 cm LA Vol (A4C): 43.4 ml 20.54 ml/m  RA Volume:   97.90 ml  46.34 ml/m  AORTIC VALVE                   PULMONIC VALVE AV Vmax:           94.30 cm/s  PV Vmax:       0.71 m/s AV Vmean:          62.900 cm/s PV Peak grad:  2.0 mmHg AV VTI:            0.202 m AV Peak Grad:      3.6 mmHg AV Mean Grad:      2.0 mmHg LVOT Vmax:         72.20 cm/s LVOT Vmean:        44.700 cm/s LVOT VTI:          0.154 m LVOT/AV VTI ratio: 0.76  AORTA Ao Root diam: 3.00 cm MITRAL VALVE MV Area (PHT): 3.76 cm    SHUNTS MV Decel Time: 202 msec    Systemic VTI: 0.15 m MV E velocity: 50.50 cm/s MV A velocity: 48.40 cm/s MV E/A ratio:  1.04 Dina Rich MD Electronically signed by Dina Rich MD Signature Date/Time: 11/28/2022/12:58:19 PM    Final       Blood pressure 125/68, pulse 70, temperature 97.8 F (36.6 C), temperature source Oral, resp. rate 17,  height 6\' 1"  (1.854 m), weight 85.2 kg, SpO2 99 %.  Medical Problem List and Plan: 1. Functional deficits secondary to significant disability related to acute respiratory failure.   -patient may shower (cover right wrist )  -ELOS/Goals: 10-12 days with mod I goals  2.  Antithrombotics: -DVT/anticoagulation:  Pharmaceutical: Heparin  -antiplatelet therapy: Aspirin 81 mg daily, Plavix  3. Pain Management: Tylenol as needed  -Zanaflex 4 mg q HS  -right knee pain after recent fall. May have contused patella or strained patella tendon -pt reports pain is beginning to slightly improve -will check knee XR to rule out fx -may use ice as needed 4. Mood/Behavior/Sleep: LCSW to evaluate and provide emotional support  -antipsychotic agents: n/a  5. Neuropsych/cognition: This patient is capable of making decisions on his own behalf.  6. Skin/Wound Care: Routine skin care checks  -monitor multiple skin tears  -local wound care to each area.   -pad contact areas as needed for protection   7. Fluids/Electrolytes/Nutrition: Strict Is and Os and follow-up chemistries  -continue vitamin D, B12  -Heart Healthy strict 1.5 L fluid restriction per day   8: Hypertension: monitor TID and prn (?? home amlodipine and lisinopril held)  -continue Imdur 30 mh q AM (?? home 60 mg AM, 30 mg PM)  -continue Lopressor 25 mg BID (?? 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 I   messaged rehab pharmacist with regards to prednisone orders. (?? home prednisone 20 mg q AM) -continue pirfenidone -will need to establish new baseline oxygen requirement at rest and with exertion  12: Pulmonary hypertension/right heart failure: (?? home torsemide held)  -daily weights  -currently no diuretics (states torsemide gives him hand cramps)  13: AKI atop CKD stage IIIb: current Cr 1.15  -follow-up BMP  14: CAD: continue aspirin, Plavix, statin  -follows with Dr. Tresa Endo  15: Carotid stenosis: follow-up outpatient       Milinda Antis, PA-C 11/30/2022

## 2022-11-30 NOTE — Progress Notes (Signed)
This chaplain is present with the Pt., friend, notary, and witnesses for notarizing of the Pt. Advance Directive: HCPOA and Living will. The Pt. completed education on Monday and answered clarifying questions today with the chaplain.   The Pt. chose [name] as his healthcare agent and completed a Living Will.  The chaplain gave the Pt. the original AD along with one copy. The chaplain scanned the Pt. AD into the Pt. EMR.   This chaplain is available for F/U spiritual care as needed.  Chaplain Stephanie Acre [phone]

## 2022-11-30 NOTE — Progress Notes (Signed)
INPATIENT REHABILITATION ADMISSION NOTE   Arrival Method:wheelchair     Mental Orientation:alert   Assessment:done   Skin:done   IV'S:right f/a   Pain:none   Tubes and Drains:none   Safety Measures: done  Vital Signs: done  Height and Weight: done  Rehab Orientation:done   Family: Brothers was with patient   Notes:

## 2022-11-30 NOTE — Progress Notes (Signed)
Inpatient Rehabilitation Admissions Coordinator   I met with patient at bedside with his friend. I have CIR bed and can admit today. He is in agreement. Acute team and TOC made aware. I will make the arrangements.  Ottie Glazier, RN, MSN Rehab Admissions Coordinator [phone] 11/30/2022 11:10 AM

## 2022-11-30 NOTE — Progress Notes (Signed)
Ranelle Oyster, MD  Physician Physical Medicine and Rehabilitation   PMR Pre-admission    Signed   Date of Service: 11/29/2022  3:40 PM  Related encounter: ED to Hosp-Admission (Current) from 11/26/2022 in [facility]   Signed      Show:Clear all [x] Written[x] Templated[x] Copied  Added by: [x] Beckie Salts, Tye [place], RN[x] [place], Kristyn H[x] Ranelle Oyster, MD  [] Hover for details PMR Admission Coordinator Pre-Admission Assessment   Patient: Maurice Munoz is an 77 y.o., male MRN: [id number] DOB: 12-16-1945 Height: 6\' 1"  (185.4 cm) Weight: 85.2 kg Insurance Information HMO:     PPO:      PCP:      IPA:      80/20:      OTHER:  PRIMARY: Medicare A & B      Policy#: [id number]      Subscriber: patient Benefits:  Phone #: passport one source     Name:  Eff. Date: 01/19/2005    Deduct: $2956      CIR: 100%      SNF: 20 full days Outpatient: 80%    Home Health: 100%       DME: 80%      Providers:in network   SECONDARY: A[employer]       Policy#: [id number]     Phone#: [phone]   Financial Counselor:       Phone#:    The "Data Collection Information Summary" for patients in Inpatient Rehabilitation Facilities with attached "Privacy Act Statement-Health Care Records" was provided and verbally reviewed with: Patient   Emergency Contact Information Contact Information       Name Relation Home Work Mobile    [place] Granddaughter [phone]   [phone]         Current Medical History  Patient Admitting Diagnosis: Hypoxia, syncope, fall   History of Present Illness: Maurice Munoz  is a 77 y.o. male, with past medical history of coronary artery disease, status post PCI, interstitial pulmonary fibrosis, with his right heart failure, hypertension, hyperlipidemia.   Patient presents 11/26/22 secondary to syncope x2, patient reports he lives at home by himself, reports he was outside getting ready to go to the doctor's appointment, he felt  lightheaded, he sat down on scooter, where he had unwitnessed syncopal event, reports when he woke up he was laying on the asphalt, where he had multiple lacerations and bruises, family found him on the ground, they tried to set him up, where he had another syncopal events, reports he was out for 15 minutes, per EMS he was initially hypotensive in the 70s, where he responded to fluid bolus, blood sugar was 131, he is on home oxygen at baseline, but he was noted to be significantly hypoxic on his home oxygen requirement.    Presented to [facility] ED on 11/26/22 with profound hypoxia on home oxygen 3 L, currently requiring nonrebreather and salter together, MRI obtained as part of syncope workup, there was a concern for carotid artery stenosis, discussed with neurology who recommended CTA head and neck, it was significant for complete occlusion of left cervical ICA, and 55% stenosis of the proximal right cervical ICA (Dr. Darlyn Read reported given his total occlusion, workup is as outpatient), CTA chest  negative for PE, but with possible volume overload, [facility] hospitalist consulted to admit. Patient required 15 L salter and nonrebreather on admission. ECHO stable as showing as expected increased right-sided RV overload with reduced RV function, respiratory viral panel negative. MRI brain non acute,  Fall at home with multiple skin tears and soft tissue injuries, blood pressure currently soft. Blood pressure medications on hold.     Patient's medical record from [facility] has been reviewed by the rehabilitation admission coordinator and physician.   Past Medical History      Past Medical History:  Diagnosis Date   Adrenal adenoma, left      small   Aortic atherosclerosis (HCC)     Back pain     Bilateral carotid artery stenosis followed by dr Tresa Endo    per last carotid duplex 07-17-2014  -- proximal LICA 50-69% ,  RICA 0-49%   CAD (coronary artery disease)      a. s/p prior intervention to RCA and PDA in  1999 b. DES to LAD and distal RCA in 2007   Cardiomegaly      Stable   Chronic constipation     Chronic pain syndrome     CKD (chronic kidney disease), stage III (HCC)     DDD (degenerative disc disease), cervical     DDD (degenerative disc disease), lumbosacral     Dyspnea     Dyspnea on exertion     Gout      per pt last inflared episode 2015 approx.   Grade II diastolic dysfunction     H/O epistaxis      per pt sees ENT dr Suszanne Conners for cauterization (pt takes plavix)   History of acute pancreatitis 08/08/2014   History of kidney stones     HTN (hypertension)     Hyperlipidemia     Interstitial lung disease (HCC)     Left arm pain      s/p fall   Nocturia     Numbness of right foot      due to DDD lumbar   Peripheral edema     Pilonidal cyst     Pleural effusion on right     Pulmonary fibrosis (HCC)      followed by pcp-- last chest CT 04-08-2016   S/P drug eluting coronary stent placement 10/11/2005    mLAD and dRCA   Wears dentures      upper   Wears glasses      Has the patient had major surgery during 100 days prior to admission? No   Family History   family history includes Cancer in his father; Diabetes in his brother; Heart attack in his brother.   Current Medications   Current Facility-Administered Medications:    acetaminophen (TYLENOL) tablet 650 mg, 650 mg, Oral, Q6H PRN, Elgergawy, Leana Roe, MD, 650 mg at 11/29/22 0942   albuterol (PROVENTIL) (2.5 MG/3ML) 0.083% nebulizer solution 2.5 mg, 2.5 mg, Nebulization, Q2H PRN, Elgergawy, Leana Roe, MD   allopurinol (ZYLOPRIM) tablet 300 mg, 300 mg, Oral, q AM, Elgergawy, Leana Roe, MD, 300 mg at 11/30/22 1324   aspirin EC tablet 81 mg, 81 mg, Oral, q AM, Elgergawy, Leana Roe, MD, 81 mg at 11/30/22 0629   atorvastatin (LIPITOR) tablet 80 mg, 80 mg, Oral, Daily, Elgergawy, Leana Roe, MD, 80 mg at 11/30/22 0841   benzonatate (TESSALON) capsule 100 mg, 100 mg, Oral, Q4H PRN, Leroy Sea, MD, 100 mg at 11/29/22 2120    cholecalciferol (VITAMIN D3) 25 MCG (1000 UNIT) tablet 5,000 Units, 5,000 Units, Oral, q AM, Leroy Sea, MD, 5,000 Units at 11/30/22 0629   clopidogrel (PLAVIX) tablet 75 mg, 75 mg, Oral, Daily, Elgergawy, Leana Roe, MD, 75 mg at 11/30/22 0841   cyanocobalamin (  VITAMIN B12) tablet 1,000 mcg, 1,000 mcg, Oral, q AM, Elgergawy, Leana Roe, MD, 1,000 mcg at 11/30/22 0629   diltiazem (CARDIZEM) injection 10 mg, 10 mg, Intravenous, Q6H PRN, Leroy Sea, MD, 10 mg at 11/27/22 1714   heparin injection 5,000 Units, 5,000 Units, Subcutaneous, Q8H, Elgergawy, Leana Roe, MD, 5,000 Units at 11/30/22 6578   hydrALAZINE (APRESOLINE) injection 5 mg, 5 mg, Intravenous, Q4H PRN, Elgergawy, Leana Roe, MD   isosorbide mononitrate (IMDUR) 24 hr tablet 30 mg, 30 mg, Oral, q morning, Susa Raring K, MD, 30 mg at 11/30/22 0841   methylPREDNISolone sodium succinate (SOLU-MEDROL) 40 mg/mL injection 40 mg, 40 mg, Intravenous, Q12H, Leroy Sea, MD, 40 mg at 11/30/22 4696   metoprolol tartrate (LOPRESSOR) tablet 25 mg, 25 mg, Oral, BID, Susa Raring K, MD, 25 mg at 11/30/22 0841   morphine (PF) 2 MG/ML injection 1 mg, 1 mg, Intravenous, Q12H PRN, Leroy Sea, MD   nitroGLYCERIN (NITROSTAT) SL tablet 0.4 mg, 0.4 mg, Sublingual, Q5 min PRN, Elgergawy, Leana Roe, MD   pantoprazole (PROTONIX) EC tablet 40 mg, 40 mg, Oral, Daily, Elgergawy, Leana Roe, MD, 40 mg at 11/30/22 0841   Pirfenidone TABS 801 mg, 1 tablet, Oral, TID WC, Elgergawy, Leana Roe, MD, 801 mg at 11/30/22 0841   tiZANidine (ZANAFLEX) tablet 4 mg, 4 mg, Oral, QHS, Elgergawy, Leana Roe, MD, 4 mg at 11/29/22 2121   Patients Current Diet:  Diet Order                  Diet Heart Room service appropriate? Yes; Fluid consistency: Thin; Fluid restriction: 1200 mL Fluid  Diet effective now                       Precautions / Restrictions Precautions Precautions: Fall Precaution Comments: watch O2 and BP Restrictions Weight Bearing  Restrictions: No    Has the patient had 2 or more falls or a fall with injury in the past year? Yes   Prior Activity Level Community (5-7x/wk): independent with walker or cane   Prior Functional Level Self Care: Did the patient need help bathing, dressing, using the toilet or eating? Independent   Indoor Mobility: Did the patient need assistance with walking from room to room (with or without device)? Independent   Stairs: Did the patient need assistance with internal or external stairs (with or without device)? Independent   Functional Cognition: Did the patient need help planning regular tasks such as shopping or remembering to take medications? Independent   Patient Information Are you of Hispanic, Latino/a,or Spanish origin?: A. No, not of Hispanic, Latino/a, or Spanish origin What is your race?: A. White Do you need or want an interpreter to communicate with a doctor or health care staff?: 0. No   Patient's Response To:  Health Literacy and Transportation Is the patient able to respond to health literacy and transportation needs?: Yes Health Literacy - How often do you need to have someone help you when you read instructions, pamphlets, or other written material from your doctor or pharmacy?: Never In the past 12 months, has lack of transportation kept you from medical appointments or from getting medications?: No In the past 12 months, has lack of transportation kept you from meetings, work, or from getting things needed for daily living?: No   Journalist, newspaper / Equipment Home Assistive Devices/Equipment: None Home Equipment: Art gallery manager, Agricultural consultant (2 wheels), [employer] - single point, Information systems manager - built  in, Grab bars - tub/shower, Hand held shower head   Prior Device Use: Indicate devices/aids used by the patient prior to current illness, exacerbation or injury?  Cane, walker   Current Functional Level Cognition   Overall Cognitive Status: Within Functional  Limits for tasks assessed Orientation Level: Oriented X4 General Comments: A&Ox4, pleasant throughout session    Extremity Assessment (includes Sensation/Coordination)   Upper Extremity Assessment: Overall WFL for tasks assessed  Lower Extremity Assessment: Defer to PT evaluation     ADLs   Overall ADL's : Needs assistance/impaired Eating/Feeding: Independent, Sitting Grooming: Wash/dry hands, Wash/dry face, Set up, Sitting Upper Body Bathing: Minimal assistance, Sitting Lower Body Bathing: Minimal assistance, Sit to/from stand Upper Body Dressing : Minimal assistance, Moderate assistance, Sitting Lower Body Dressing: Minimal assistance, Sit to/from stand Toilet Transfer: Minimal assistance, Stand-pivot, BSC/3in1     Mobility   Overal bed mobility: Needs Assistance Bed Mobility: Sit to Supine Supine to sit: Min assist, HOB elevated Sit to supine: Min guard General bed mobility comments: pt up in chair on arrival     Transfers   Overall transfer level: Needs assistance Equipment used: Rolling walker (2 wheels) Transfers: Sit to/from Stand, Bed to chair/wheelchair/BSC Sit to Stand: Min assist Bed to/from chair/wheelchair/BSC transfer type:: Stand pivot Stand pivot transfers: Min assist General transfer comment: min A to power up from recliner with pt needing increased time to eleavte trunk to full upright standing     Ambulation / Gait / Stairs / Wheelchair Mobility   Ambulation/Gait General Gait Details: not attempted today due to BP Pre-gait activities: standing marching x10     Posture / Balance Balance Overall balance assessment: Needs assistance Sitting-balance support: Feet supported Sitting balance-Leahy Scale: Good Standing balance support: Reliant on assistive device for balance Standing balance-Leahy Scale: Poor Standing balance comment: reliant on UE support with static standing     Special needs/care consideration Uses ) 2 at 3 to 5 liters at home during the  day. Does not use when sleeping in recliner or at night. Further education on need for 24/7 I needed Uses Scooter in th community due to rest break needs when ambulating     Vassie Loll, RN  Registered Nurse WOC   Consult Note    Signed   Date of Service: 11/29/2022 11:13 AM    Signed       Show:Clear all [x] Written[x] Templated[] Copied   Added by: [x] Vassie Loll, RN   [] Hover for details WOC Nurse Consult Note: Reason for Consult:10 wounds Wound type: patient sustained multiple falls, presents with multiple abrasions and skin tears over the UE/head/face/LEs Pressure Injury POA: NA Measurement: see nursing notes Wound ZOX:WRUE tears Drainage (amount, consistency, odor) see nursing notes Periwound:frail skin Dressing procedure/placement/frequency: Using the nursing skin care order set. Change all skin tears and abrasions to be covered with single layer of xeroform gauze [place] # [phone]). Top with silicone foam or secure with Kerlix, change every 3 days  NO TAPE directly on patient's skin    Re consult if needed, will not follow at this time. Thanks  [employer], RN,CWOCN, CNS, CWON-AP [phone])                        Previous Home Environment  Living Arrangements: Alone Available Help at Discharge: Family, Friend(s), Available 24 hours/day Type of Home: House Home Layout: One level Home Access: Stairs to enter Entrance Stairs-Rails: None Entrance Stairs-Number of Steps: 1 threshold Bathroom Shower/Tub: Walk-in  shower Bathroom Toilet: Handicapped height Bathroom Accessibility: Yes How Accessible: Accessible via walker Home Care Services: No   Discharge Living Setting Plans for Discharge Living Setting: Patient's home Type of Home at Discharge: House Discharge Home Layout: One level Discharge Home Access: Stairs to enter Entrance Stairs-Rails: None Entrance Stairs-Number of Steps: 1 Discharge Bathroom Shower/Tub: Walk-in  shower Discharge Bathroom Toilet: Handicapped height Discharge Bathroom Accessibility: Yes How Accessible: Accessible via walker Does the patient have any problems obtaining your medications?: No   Social/Family/Support Systems Anticipated Caregiver: granddaughter, [name], patient's brother Anticipated Caregiver's Contact Information: [phone] Ability/Limitations of Caregiver: supervision Caregiver Availability: 24/7 Discharge Plan Discussed with Primary Caregiver: Yes Is Caregiver In Agreement with Plan?: Yes Does Caregiver/Family have Issues with Lodging/Transportation while Pt is in Rehab?: No   Goals Patient/Family Goal for Rehab: Mod I PT, OT Expected length of stay: 10-12 days Pt/Family Agrees to Admission and willing to participate: Yes Program Orientation Provided & Reviewed with Pt/Caregiver Including Roles  & Responsibilities: Yes  Barriers to Discharge: Insurance for SNF coverage   Decrease burden of Care through IP rehab admission: Othern/a   Possible need for SNF placement upon discharge: not anticipated   Patient Condition: I have reviewed medical records from [facility], spoken with  [facility] , and patient and family member. I met with patient at the bedside and discussed via phone for inpatient rehabilitation assessment.  Patient will benefit from ongoing PT and OT, can actively participate in 3 hours of therapy a day 5 days of the week, and can make measurable gains during the admission.  Patient will also benefit from the coordinated team approach during an Inpatient Acute Rehabilitation admission.  The patient will receive intensive therapy as well as Rehabilitation physician, nursing, social worker, and care management interventions.  Due to safety, skin/wound care, disease management, medication administration, pain management, and patient education the patient requires 24 hour a day rehabilitation nursing.  The patient is currently min assist overall with mobility and  basic ADLs.  Discharge setting and therapy post discharge at home with home health is anticipated.  Patient has agreed to participate in the Acute Inpatient Rehabilitation Program and will admit today.   Preadmission Screen Completed By:  Worthy Keeler MSN 11/30/2022 11:16 AM ______________________________________________________________________   Discussed status with Dr. Riley Kill on 11/30/22 at 1117 and received approval for admission today.   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 Yes Co-Morbidities requiring supervision/potential complications: CAD, IPF, rCHF, HTN Due to bladder management, bowel management, safety, skin/wound care, disease management, medication administration, pain management, and patient education, does the patient require 24 hr/day rehab nursing? Yes Does the patient require coordinated care of a physician, rehab nurse, PT, OT to address physical and functional deficits in the context of the above medical diagnosis(es)? Yes Addressing deficits in the following areas: balance, endurance, locomotion, strength, transferring, bowel/bladder control, bathing, dressing, feeding, grooming, toileting, and psychosocial support Can the patient actively participate in an intensive therapy program of at least 3 hrs of therapy 5 days a week? Yes The potential for patient to make measurable gains while on inpatient rehab is excellent Anticipated functional outcomes upon discharge from inpatient rehab: modified independent PT, modified independent OT, n/a SLP Estimated rehab length of  stay to reach the above functional goals is: 10-12 days Anticipated discharge destination: Home 10.  Overall Rehab/Functional Prognosis: excellent     MD Signature: Ranelle Oyster, MD, [facility] Physical Medicine & Rehabilitation Medical Director Rehabilitation Services 11/30/2022          Revision History

## 2022-11-30 NOTE — Discharge Summary (Signed)
[name] KVQ:[id number] DOB: 1946-02-02 DOA: 11/26/2022  PCP: [name], MD  Admit date: 11/26/2022  Discharge date: 11/30/2022  Admitted From: Home   Disposition:  CIR   Recommendations for Outpatient Follow-up:   Follow up with PCP in 1-2 weeks  PCP Please obtain BMP/CBC, 2 view CXR in 1week,  (see Discharge instructions)   PCP Please follow up on the following pending results: Monitor BMP, magnesium, blood pressure dose, diuretic dose closely.   Home Health: None   Equipment/Devices: None  Consultations: PCCM Discharge Condition: Stable    CODE STATUS: Full    Diet Recommendation: Heart Healthy strict 1.5 L fluid restriction per day  Diet Order             Diet Heart Room service appropriate? Yes; Fluid consistency: Thin; Fluid restriction: 1200 mL Fluid  Diet effective now                     Brief history of present illness from the day of admission and additional interim summary    77 y.o. male, with past medical history of coronary artery disease, status post PCI, interstitial pulmonary fibrosis, with his right heart failure, hypertension, hyperlipidemia.   -Patient presents today secondary to syncope x2, patient reports he lives at home by himself, reports he was outside getting ready to go to the doctor's appointment, he felt lightheaded, he sat down, where he had unwitnessed syncopal event, reports when he woke up he was laying on the as felt, where he had multiple durations and bruises, family found him on the ground, they tried to set him up, where he had another syncopal events, reports he was out for 15 minutes, per EMS he was initially hypotensive in the 70s, where he responded to fluid bolus, blood sugar was 131, he is on home oxygen at baseline, but he was noted to be  significantly hypoxic on his home oxygen requirement.   In ED patient with profound hypoxia on home oxygen 3 L, currently requiring nonrebreather and salter together, MRI obtained as part of syncope workup, there was a concern for carotid artery stenosis, discussed with neurology who recommended CTA head and neck, it was significant for complete occlusion of left cervical ICA, and 55% stenosis of the proximal right cervical ICA (Dr. Darlyn Read reported given his total occlusion, workup is as outpatient), CTA chest  negative for PE, but with possible volume overload, [facility] hospitalist consulted to admit at [facility], ER, he was subsequently transferred to [facility] for further care.                                                                 Hospital Course   Acute on chronic hypoxic respiratory failure in a patient with advanced underlying IPF on 5  L nasal cannula oxygen with history of pulmonary hypertension and known right-sided heart failure.  EF 60% with echo evidence of increased RV pressure and reduced RV systolic function all expected with advanced underlying IPF, no PE on CTA.   Pulmonary is following the patient, he has been placed on IV steroids along with supportive care with nebulizer treatments and additional oxygen, he uses 5 L at baseline, case discussed with pulmonary on 11/27/2022, some evidence of fluid overload as well responding well to diuretics, continue IV Lasix on 11/30/2022 as tolerated by blood pressure, echocardiogram stable as showing as expected increased right-sided RV overload with reduced RV function, respiratory viral panel negative. Added I-S and flutter valve for pulmonary toiletry.    He is now down to 3 L nasal cannula oxygen which is better than what he uses at home, switch to oral diuretic along with oral steroid.  He needs to taper off his steroids in the next 7 to 10 days, note he takes steroids at home only on as needed basis.  Monitor BMP, fluid status, diuretic  dose closely, taper of steroids in the next 7 to 10 days.  Post discharge follow-up with his pulmonologist Dr. Isaiah Serge in 7 to 10 days.    Syncope.  Likely due to hypoxia.  MRI brain nonacute. Fall at home with multiple skin tears and soft tissue injuries, PT OT to continue, may require placement.  Although he himself is eager to go home.  Couple small abrasions and skin tears continue wound care.   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  Per neurology Case was discussed upon admission outpatient follow-up.  Continue aspirin and statin for secondary prevention also on Plavix.  Avoid hypotension as cerebral perfusion can be compromised easily.  Outpatient follow-up with [facility] neurology in  2 to 3 weeks postdischarge.   Dyslipidemia.  On statin.   CKDstage IIIb.  Mild AKI.  Currently on diuretics monitor cautiously.  Avoid nephrotoxins.  AKI much improved.  Close to his baseline now.   CAD.  No acute issues.  On DAPT and statin for secondary prevention.  Monitor.   Hypertension.  Blood pressure improved medications adjusted continue to monitor and adjust at CIR.    Discharge diagnosis     Principal Problem:   Acute on chronic respiratory failure with hypoxia (HCC) Active Problems:   Obesity (BMI 30-39.9)   Pancreatitis-Idiopathy acute 2.18.16 admitted [facility]   Hyperlipidemia LDL goal <70   CAD in native artery   CKD (chronic kidney disease) stage 3, GFR 30-59 ml/min (HCC)   IPF (idiopathic pulmonary fibrosis) (HCC)   Essential hypertension    Discharge instructions    Discharge Instructions     Discharge instructions   Complete by: As directed    Follow with Primary MD [name], MD in 7 days   Get CBC, CMP, 2 view Chest X ray -  checked next visit with your primary MD or CIRMD    Activity: As tolerated with Full fall precautions use walker/cane & assistance as needed  Disposition CIR  Diet: Heart Healthy, Check your Weight same time everyday, if you gain  over 2 pounds, or you develop in leg swelling, experience more shortness of breath or chest pain, call your Primary MD immediately. Follow Cardiac Low Salt Diet and 1.5 lit/day fluid restriction.  Special Instructions: If you have smoked or chewed Tobacco  in the last 2 yrs please stop smoking, stop any regular Alcohol  and or any Recreational drug use.  On your next visit with your primary care physician please Get Medicines reviewed and adjusted.  Please request your Prim.MD to go over all Hospital Tests and Procedure/Radiological results at the follow up, please get all Hospital records sent to your Prim MD by signing hospital release before you go home.  If you experience worsening of your admission symptoms, develop shortness of breath, life threatening emergency, suicidal or homicidal thoughts you must seek medical attention immediately by calling 911 or calling your MD immediately  if symptoms less severe.  You Must read complete instructions/literature along with all the possible adverse reactions/side effects for all the Medicines you take and that have been prescribed to you. Take any new Medicines after you have completely understood and accpet all the possible adverse reactions/side effects.   Discharge wound care:   Complete by: As directed    Cover any open wounds of the UE/LE with single layer of xeroform gauze, top with silicone foam. Change every 3 days   Increase activity slowly   Complete by: As directed        Discharge Medications   Allergies as of 11/30/2022       Reactions   Ibuprofen Itching   Motrin brand   Neosporin [neomycin-bacitracin Zn-polymyx] Swelling   Penicillins Rash    Has patient had a PCN reaction causing immediate rash, facial/tongue/throat swelling, SOB or lightheadedness with hypotension: No Has patient had a PCN reaction causing severe rash involving mucus membranes or skin necrosis: No Has patient had a PCN reaction that required  hospitalization: No Has patient had a PCN reaction occurring within the last 10 years: No If all of the above answers are "NO", then may proceed with Cephalosporin use.        Medication List     STOP taking these medications    amLODipine 5 MG tablet Commonly known as: NORVASC   benzonatate 200 MG capsule Commonly known as: TESSALON   doxycycline 100 MG capsule Commonly known as: VIBRAMYCIN   lisinopril 2.5 MG tablet Commonly known as: ZESTRIL   oxyCODONE-acetaminophen 10-325 MG tablet Commonly known as: PERCOCET       TAKE these medications    acetaminophen 500 MG tablet Commonly known as: TYLENOL Take 1,000 mg by mouth every 6 (six) hours as needed (pain.).   allopurinol 300 MG tablet Commonly known as: ZYLOPRIM Take 300 mg by mouth in the morning.   aspirin EC 81 MG tablet Take 81 mg by mouth in the morning.   atorvastatin 80 MG tablet Commonly known as: LIPITOR TAKE ONE TABLET (80MG  TOTAL) BY MOUTH AT6PM What changed: See the new instructions.   clopidogrel 75 MG tablet Commonly known as: PLAVIX TAKE ONE (1) TABLET BY MOUTH EVERY DAY   cyanocobalamin 1000 MCG tablet Commonly known as: VITAMIN B12 Take 1,000 mcg by mouth in the morning.   Esbriet 801 MG Tabs Generic drug: Pirfenidone Take 1 tablet (801 mg total) by mouth 3 (three) times daily with meals. What changed:  how much to take when to take this additional instructions   isosorbide mononitrate 30 MG 24 hr tablet Commonly known as: IMDUR Take 1 tablet (30 mg total) by mouth every morning. What changed:  medication strength how much to take how to take this when to take this additional instructions   metoprolol tartrate 50 MG tablet Commonly known as: LOPRESSOR Take 2 tablets (100 mg total) by mouth every morning AND 1.5 tablets (75 mg total) every evening.   nitroGLYCERIN 0.4 MG  SL tablet Commonly known as: NITROSTAT Place 1 tablet (0.4 mg total) under the tongue every 5  (five) minutes as needed for chest pain.   OXYGEN Inhale 4-5 L into the lungs as needed (respiratory issues/with activity).   predniSONE 20 MG tablet Commonly known as: DELTASONE Take 1 tablet (20 mg total) by mouth daily with breakfast. What changed:  medication strength how much to take when to take this reasons to take this   tiZANidine 4 MG tablet Commonly known as: ZANAFLEX Take 4 mg by mouth at bedtime.   torsemide 10 MG tablet Commonly known as: DEMADEX Take 1 tablet (10 mg total) by mouth daily. What changed:  medication strength See the new instructions.   VITAMIN D PO Take 5,000 Units by mouth in the morning.               Discharge Care Instructions  (From admission, onward)           Start     Ordered   11/30/22 0000  Discharge wound care:       Comments: Cover any open wounds of the UE/LE with single layer of xeroform gauze, top with silicone foam. Change every 3 days   11/30/22 0815             Follow-up Information     [name], MD. Schedule an appointment as soon as possible for a visit in 1 week(s).   Specialty: Internal Medicine Why: Also follow-up with your pulmonologist and your cardiologist within a week of discharge. Contact information: [address] [place] [address] [phone]         Jake Bathe, MD. Schedule an appointment as soon as possible for a visit in 2 week(s).   Specialty: Cardiology Contact information: [address] [place] [address] [phone]                 Major procedures and Radiology Reports - PLEASE review detailed and final reports thoroughly  -      ECHOCARDIOGRAM COMPLETE  Result Date: 11/28/2022    ECHOCARDIOGRAM REPORT   Patient Name:   Maurice Munoz Date of Exam: 11/28/2022 Medical Rec #:  [id number]        Height:       73.0 in Accession #:    [id number]       Weight:       191.6 lb Date of Birth:  05-May-1946        BSA:           2.113 m Patient Age:    76 years         BP:           125/74 mmHg Patient Gender: M                HR:           67 bpm. Exam Location:  Inpatient Procedure: 2D Echo, Color Doppler and Cardiac Doppler Indications:    Pulmonary hypertension I27.2  History:        Patient has prior history of Echocardiogram examinations, most                 recent 06/30/2022. CAD and Previous Myocardial Infarction, Prior                 Cardiac Surgery, CKD and Pulmonary HTN; Risk  Factors:Hypertension, Former Smoker and Dyslipidemia.  Sonographer:    Dondra Prader RVT RCS Referring Phys: [id number] [address]  Sonographer Comments: Technically challenging study due to limited acoustic windows, Technically difficult study due to poor echo windows, suboptimal parasternal window, suboptimal apical window and suboptimal subcostal window. Definity deemed inappropriate due to limited to no windows. Patient unable to reposition IMPRESSIONS  1. LV poorly visualized. Grossly LV function appears to be normal, roughly 55-60%. . Left ventricular ejection fraction, by estimation, is 55 to 60%. The left ventricle has normal function. Left ventricular endocardial border not optimally defined to evaluate regional wall motion. Left ventricular diastolic parameters are indeterminate.  2. Ventricular septum is flattened in diastole suggesting RV pressure overload. . Right ventricular systolic function moderately to severely reduced. The right ventricular size is moderately to severely dilated. Tricuspid regurgitation signal is inadequate for assessing PA pressure.  3. Right atrial size was severely dilated.  4. The mitral valve was not well visualized. No evidence of mitral valve regurgitation. No evidence of mitral stenosis.  5. The aortic valve was not well visualized. Aortic valve regurgitation is not visualized. No aortic stenosis is present.  6. The inferior vena cava is normal in size with greater than 50% respiratory  variability, suggesting right atrial pressure of 3 mmHg. FINDINGS  Left Ventricle: LV poorly visualized. Grossly LV function appears to be normal, roughly 55-60%. Left ventricular ejection fraction, by estimation, is 55 to 60%. The left ventricle has normal function. Left ventricular endocardial border not optimally defined to evaluate regional wall motion. The left ventricular internal cavity size was normal in size. There is no left ventricular hypertrophy. Left ventricular diastolic parameters are indeterminate. Right Ventricle: Ventricular septum is flattened in diastole suggesting RV pressure overload. The right ventricular size is moderately to severely dilated. Right vetricular wall thickness was not well visualized. Right ventricular systolic function moderately to severely reduced. Tricuspid regurgitation signal is inadequate for assessing PA pressure. Left Atrium: Left atrial size was not well visualized. Right Atrium: Right atrial size was severely dilated. Pericardium: There is no evidence of pericardial effusion. Mitral Valve: The mitral valve was not well visualized. No evidence of mitral valve regurgitation. No evidence of mitral valve stenosis. Tricuspid Valve: The tricuspid valve is not well visualized. Tricuspid valve regurgitation is trivial. No evidence of tricuspid stenosis. Aortic Valve: The aortic valve was not well visualized. Aortic valve regurgitation is not visualized. No aortic stenosis is present. Aortic valve mean gradient measures 2.0 mmHg. Aortic valve peak gradient measures 3.6 mmHg. Pulmonic Valve: The pulmonic valve was not well visualized. Pulmonic valve regurgitation is not visualized. No evidence of pulmonic stenosis. Aorta: The aortic root is normal in size and structure. Venous: The inferior vena cava is normal in size with greater than 50% respiratory variability, suggesting right atrial pressure of 3 mmHg. IAS/Shunts: No atrial level shunt detected by color flow Doppler.   LEFT VENTRICLE PLAX 2D LVIDd:         3.80 cm LVIDs:         2.80 cm LV PW:         1.00 cm LV IVS:        0.80 cm  RIGHT VENTRICLE            IVC RV Basal diam:  4.90 cm    IVC diam: 1.60 cm RV S prime:     6.18 cm/s LEFT ATRIUM           Index  RIGHT ATRIUM           Index LA diam:      3.40 cm 1.61 cm/m   RA Area:     27.10 cm LA Vol (A4C): 43.4 ml 20.54 ml/m  RA Volume:   97.90 ml  46.34 ml/m  AORTIC VALVE                   PULMONIC VALVE AV Vmax:           94.30 cm/s  PV Vmax:       0.71 m/s AV Vmean:          62.900 cm/s PV Peak grad:  2.0 mmHg AV VTI:            0.202 m AV Peak Grad:      3.6 mmHg AV Mean Grad:      2.0 mmHg LVOT Vmax:         72.20 cm/s LVOT Vmean:        44.700 cm/s LVOT VTI:          0.154 m LVOT/AV VTI ratio: 0.76  AORTA Ao Root diam: 3.00 cm MITRAL VALVE MV Area (PHT): 3.76 cm    SHUNTS MV Decel Time: 202 msec    Systemic VTI: 0.15 m MV E velocity: 50.50 cm/s MV A velocity: 48.40 cm/s MV E/A ratio:  1.04 Dina Rich MD Electronically signed by Dina Rich MD Signature Date/Time: 11/28/2022/12:58:19 PM    Final    DG Chest Port 1 View  Result Date: 11/28/2022 CLINICAL DATA:  Shortness of breath EXAM: PORTABLE CHEST 1 VIEW COMPARISON:  11/27/2022 FINDINGS: Cardiac shadow is stable. Lungs are well aerated bilaterally. Diffuse chronic interstitial changes are again seen bilaterally. No new focal infiltrate is seen. Postsurgical changes in the cervical spine are noted. IMPRESSION: Chronic interstitial changes without acute abnormality. Electronically Signed   By: Alcide Clever M.D.   On: 11/28/2022 10:04   DG Chest Port 1 View  Result Date: 11/27/2022 CLINICAL DATA:  Shortness of breath. EXAM: PORTABLE CHEST 1 VIEW COMPARISON:  CT 11/26/2022 FINDINGS: Stable cardiomediastinal contours. Unchanged small bilateral pleural effusions. Severe changes of chronic interstitial lung disease is again seen throughout both lungs with diffuse interstitial reticulation, architectural  distortion and scarring. Diffuse ground-glass attenuation and patchy airspace opacification are unchanged from the previous CT. IMPRESSION: 1. No change in aeration to the lungs compared with previous CT. 2. Severe changes of chronic interstitial lung disease. Electronically Signed   By: Signa Kell M.D.   On: 11/27/2022 07:58   CT Angio Chest PE W/Cm &/Or Wo Cm  Result Date: 11/26/2022 CLINICAL DATA:  Pulmonary embolus suspected with high probability. Syncope and fall. Dizziness. EXAM: CT ANGIOGRAPHY CHEST WITH CONTRAST TECHNIQUE: Multidetector CT imaging of the chest was performed using the standard protocol during bolus administration of intravenous contrast. Multiplanar CT image reconstructions and MIPs were obtained to evaluate the vascular anatomy. RADIATION DOSE REDUCTION: This exam was performed according to the departmental dose-optimization program which includes automated exposure control, adjustment of the mA and/or kV according to patient size and/or use of iterative reconstruction technique. CONTRAST:  OMNIPAQUE IOHEXOL 350 MG/ML SOLN COMPARISON:  08/31/2022 FINDINGS: Cardiovascular: Technically adequate study with good opacification of the central and segmental pulmonary arteries. No focal filling defects. No evidence of significant pulmonary embolus. Cardiac enlargement with right heart predominance. No pericardial effusions. Normal caliber thoracic aorta. No aortic dissection. Calcification in the aorta and coronary arteries. Mediastinum/Nodes: Thyroid gland  is unremarkable. Esophagus is decompressed. Mediastinal lymphadenopathy and right hilar lymphadenopathy. Largest left aortopulmonic window nodes measure 1.9 cm short axis dimension. Lymph nodes are enlarged since the prior study. Lungs/Pleura: Severe interstitial infiltrates throughout the lungs with a mostly peripheral and basilar distribution. Appearances are most consistent with usual interstitial pneumonitis. Severe honeycomb  changes particularly in the lung bases. These findings are similar to the prior study. However there is interval development of small bilateral pleural effusions as well as diffuse airspace and interstitial infiltration in the aerated portions of the lungs. Changes most likely represent developing pulmonary edema although multifocal pneumonia could also have this appearance. Bulla in the right apex. Upper Abdomen: Cholelithiasis with single stone in the gallbladder neck. Gallbladder wall appears mildly thickened, possibly indicating acute cholecystitis if in the appropriate clinical setting. Consider ultrasound for further evaluation if clinically indicated. Otherwise no acute changes demonstrated in the upper abdomen. Musculoskeletal: Degenerative changes in the spine. Postoperative changes in the cervical spine. Review of the MIP images confirms the above findings. IMPRESSION: 1. No evidence of significant pulmonary embolus. 2. Cardiac enlargement with right heart prominence suggesting right heart failure. 3. New finding of bilateral pleural effusions with airspace and interstitial disease throughout the lungs likely indicating pulmonary edema. Multifocal pneumonia would be a less likely possibility. 4. Chronic underlying interstitial lung disease with diffuse peripheral distribution of pulmonary fibrosis and severe honeycomb changes. 5. Mediastinal and right hilar lymphadenopathy is increasing since prior study. Etiology is nonspecific, possibly reactive, neoplastic, or lymphoma. 6. Cholelithiasis with suggestion of gallbladder wall thickening. Consider ultrasound if there is clinical concern for acute cholecystitis. 7. Aortic atherosclerosis. Electronically Signed   By: Burman Nieves M.D.   On: 11/26/2022 16:14   CT ANGIO HEAD NECK W WO CM  Result Date: 11/26/2022 CLINICAL DATA:  Neuro deficit, acute, stroke suspected. EXAM: CT ANGIOGRAPHY HEAD AND NECK WITH AND WITHOUT CONTRAST TECHNIQUE: Multidetector CT  imaging of the head and neck was performed using the standard protocol during bolus administration of intravenous contrast. Multiplanar CT image reconstructions and MIPs were obtained to evaluate the vascular anatomy. Carotid stenosis measurements (when applicable) are obtained utilizing NASCET criteria, using the distal internal carotid diameter as the denominator. RADIATION DOSE REDUCTION: This exam was performed according to the departmental dose-optimization program which includes automated exposure control, adjustment of the mA and/or kV according to patient size and/or use of iterative reconstruction technique. CONTRAST:  OMNIPAQUE IOHEXOL 350 MG/ML SOLN COMPARISON:  MRI brain 11/26/2022. FINDINGS: CTA NECK FINDINGS Aortic arch: Three-vessel arch configuration. Atherosclerotic calcifications of the aortic arch and arch vessel origins. Arch vessel origins are patent. Right carotid system: Predominantly calcified plaque results in 55% stenosis of the proximal right cervical ICA. 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 Reconstitution at the communicating segment. Vertebral arteries: Codominant. No evidence of dissection, stenosis (50% or greater), or occlusion. Skeleton: Prior C3-C7 ACDF and C2-3 posterior spinal fusion. No suspicious bone lesions. Other neck: Unremarkable. Upper chest: Fibrosis and edema with apical bullae. Attention on same day CTA chest report. Review of the MIP images confirms the above findings CTA HEAD FINDINGS Anterior circulation: Reconstitution of the left ICA at the communicating segment. Right ICA is patent. The proximal ACAs and MCAs are patent without stenosis or aneurysm. Distal branches are symmetric. Posterior circulation: Normal basilar artery. The SCAs, AICAs and PICAs are patent proximally. The PCAs are patent proximally without stenosis or aneurysm. Distal branches are symmetric.  Venous sinuses: Patent.  Anatomic variants: None. Review of the MIP images confirms the above findings IMPRESSION: 1. 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 2. 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 3. No intracranial large vessel occlusion or high-grade stenosis. Aortic Atherosclerosis (ICD10-I70.0). Electronically Signed   By: Orvan Falconer M.D.   On: 11/26/2022 16:10   MR BRAIN WO CONTRAST  Result Date: 11/26/2022 CLINICAL DATA:  Mental status change, unknown cause. EXAM: MRI HEAD WITHOUT CONTRAST TECHNIQUE: Multiplanar, multiecho pulse sequences of the brain and surrounding structures were obtained without intravenous contrast. COMPARISON:  Head CT 11/26/2022. FINDINGS: Brain: No acute infarct or hemorrhage. 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 No hydrocephalus or extra-axial collection. No mass or midline shift. No foci of abnormal susceptibility. Vascular: Loss of the left ICA flow void. Skull and upper cervical spine: Partially visualized susceptibility artifact from prior cervical spinal fusion. Otherwise normal marrow signal. Sinuses/Orbits: Unremarkable. Other: None. IMPRESSION: 1. No acute infarct or hemorrhage. Loss of the left ICA flow void. Recommend further characterization with CTA or contrast-enhanced MRA of the head and neck. 2. 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 Electronically Signed   By: Orvan Falconer M.D.   On: 11/26/2022 13:05   DG Hand Complete Left  Result Date: 11/26/2022 CLINICAL DATA:  Fall with left hand pain. EXAM: LEFT HAND - COMPLETE 3+ VIEW COMPARISON:  None Available. FINDINGS: Bone mineralization is normal. Alignment is normal. No acute fracture or dislocation. Punctate radiodensity over the soft tissues adjacent the fourth D IP joint which may represent a tiny foreign body. Mild degenerate changes over the radiocarpal joint, radial side of the carpal bones, interphalangeal joints as well as the  first through third MCP joints. Old ulnar styloid fracture. IMPRESSION: 1. No acute findings. 2. Mild degenerative changes. 3. Possible tiny foreign body over the soft tissues adjacent the fourth DIP joint. Electronically Signed   By: Elberta Fortis M.D.   On: 11/26/2022 10:37   CT HEAD WO CONTRAST  Result Date: 11/26/2022 CLINICAL DATA:  Neck trauma EXAM: CT HEAD WITHOUT CONTRAST CT CERVICAL SPINE WITHOUT CONTRAST TECHNIQUE: Multidetector CT imaging of the head and cervical spine was performed following the standard protocol without intravenous contrast. Multiplanar CT image reconstructions of the cervical spine were also generated. RADIATION DOSE REDUCTION: This exam was performed according to the departmental dose-optimization program which includes automated exposure control, adjustment of the mA and/or kV according to patient size and/or use of iterative reconstruction technique. COMPARISON:  CT C Spine 02/24/10 FINDINGS: CT HEAD FINDINGS Brain: Subtle hypodensity in the anterior left frontal lobe (series 2, image 19) could represent an acute infarct or post-traumatic change. No evidence of hemorrhage, hydrocephalus, extra-axial collection or mass lesion/mass effect. Vascular: No hyperdense vessel or unexpected calcification. Skull: Soft tissue hematoma along the frontal scalp. No evidence calvarial fracture. Sinuses/Orbits: No acute finding. Other: None. CT CERVICAL SPINE FINDINGS Alignment: Straightening of the normal cervical lordosis. Skull base and vertebrae: Status post C3-C7 ACDF and C2-C3 posterior fusion. No acute fracture. No primary bone lesion or focal pathologic process. Soft tissues and spinal canal: No prevertebral fluid or swelling. No visible canal hematoma. Disc levels: Multilevel moderate spinal canal narrowing at C3-C7. Upper chest: Bullous change at the right lung apex. Other: None IMPRESSION: 1. Subtle cortical hypodensity in the anterior left frontal lobe could represent an acute infarct  or post-traumatic change. Brain MRI could be considered if this is  a clinical concern. 2. Soft tissue hematoma along the frontal scalp. No evidence of calvarial fracture. 3. No acute cervical spine fracture. Electronically Signed   By: Lorenza Cambridge M.D.   On: 11/26/2022 10:29   CT CERVICAL SPINE WO CONTRAST  Result Date: 11/26/2022 CLINICAL DATA:  Neck trauma EXAM: CT HEAD WITHOUT CONTRAST CT CERVICAL SPINE WITHOUT CONTRAST TECHNIQUE: Multidetector CT imaging of the head and cervical spine was performed following the standard protocol without intravenous contrast. Multiplanar CT image reconstructions of the cervical spine were also generated. RADIATION DOSE REDUCTION: This exam was performed according to the departmental dose-optimization program which includes automated exposure control, adjustment of the mA and/or kV according to patient size and/or use of iterative reconstruction technique. COMPARISON:  CT C Spine 02/24/10 FINDINGS: CT HEAD FINDINGS Brain: Subtle hypodensity in the anterior left frontal lobe (series 2, image 19) could represent an acute infarct or post-traumatic change. No evidence of hemorrhage, hydrocephalus, extra-axial collection or mass lesion/mass effect. Vascular: No hyperdense vessel or unexpected calcification. Skull: Soft tissue hematoma along the frontal scalp. No evidence calvarial fracture. Sinuses/Orbits: No acute finding. Other: None. CT CERVICAL SPINE FINDINGS Alignment: Straightening of the normal cervical lordosis. Skull base and vertebrae: Status post C3-C7 ACDF and C2-C3 posterior fusion. No acute fracture. No primary bone lesion or focal pathologic process. Soft tissues and spinal canal: No prevertebral fluid or swelling. No visible canal hematoma. Disc levels: Multilevel moderate spinal canal narrowing at C3-C7. Upper chest: Bullous change at the right lung apex. Other: None IMPRESSION: 1. Subtle cortical hypodensity in the anterior left frontal lobe could represent an  acute infarct or post-traumatic change. Brain MRI could be considered if this is a clinical concern. 2. Soft tissue hematoma along the frontal scalp. No evidence of calvarial fracture. 3. No acute cervical spine fracture. Electronically Signed   By: Lorenza Cambridge M.D.   On: 11/26/2022 10:29   DG Elbow Complete Right  Result Date: 11/26/2022 CLINICAL DATA:  Fall with right elbow pain. EXAM: RIGHT ELBOW - COMPLETE 3+ VIEW COMPARISON:  None Available. 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 No significant joint effusion. No evidence of acute fracture or dislocation. IMPRESSION: 1. No acute findings. 2. 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 Electronically Signed   By: Elberta Fortis M.D.   On: 11/26/2022 10:19   DG Hand Complete Right  Result Date: 11/26/2022 CLINICAL DATA:  Fall with left hand pain. EXAM: RIGHT HAND - COMPLETE 3+ VIEW COMPARISON:  None Available. FINDINGS: Alignment and bone mineralization is normal. There are mild degenerative changes over the radiocarpal joint, carpal bones and interphalangeal joints as well as first carpometacarpal joint. No bony erosions. No acute fracture or dislocation. Punctate radiopaque focus over the soft tissues adjacent the volar/ulnar aspect of the base of the second middle phalanx possibly a small foreign body. IMPRESSION: 1. No acute findings. 2. Mild degenerative changes. 3. Possible punctate foreign body over the soft tissues adjacent the base of the second middle phalanx. Electronically Signed   By: Elberta Fortis M.D.   On: 11/26/2022 10:17    Micro Results    Recent Results (from the past 240 hour(s))  Respiratory (~20 pathogens) panel by PCR     Status: None   Collection Time: 11/27/22  9:57 AM   Specimen: Nasopharyngeal Swab; Respiratory  Result Value Ref Range Status   Adenovirus NOT DETECTED NOT DETECTED Final   Coronavirus 229E NOT DETECTED NOT DETECTED Final    Comment: (NOTE) The Coronavirus on the Respiratory  Panel, DOES NOT test for the novel  Coronavirus (2019 nCoV)    Coronavirus HKU1 NOT DETECTED NOT DETECTED Final   Coronavirus NL63 NOT DETECTED NOT DETECTED Final   Coronavirus OC43 NOT DETECTED NOT DETECTED Final   Metapneumovirus NOT DETECTED NOT DETECTED Final   Rhinovirus / Enterovirus NOT DETECTED NOT DETECTED Final   Influenza A NOT DETECTED NOT DETECTED Final   Influenza B NOT DETECTED NOT DETECTED Final   Parainfluenza Virus 1 NOT DETECTED NOT DETECTED Final   Parainfluenza Virus 2 NOT DETECTED NOT DETECTED Final   Parainfluenza Virus 3 NOT DETECTED NOT DETECTED Final   Parainfluenza Virus 4 NOT DETECTED NOT DETECTED Final   Respiratory Syncytial Virus NOT DETECTED NOT DETECTED Final   Bordetella pertussis NOT DETECTED NOT DETECTED Final   Bordetella Parapertussis NOT DETECTED NOT DETECTED Final   Chlamydophila pneumoniae NOT DETECTED NOT DETECTED Final   Mycoplasma pneumoniae NOT DETECTED NOT DETECTED Final    Comment: Performed at [facility], [address], [place], [place] [address]    Today   Subjective    Trustyn Ewert today has no headache,no chest abdominal pain,no new weakness tingling or numbness, feels much better wants to go home today.    Objective   Blood pressure (!) 144/65, pulse (!) 54, temperature 97.8 F (36.6 C), temperature source Oral, resp. rate 17, height 6\' 1"  (1.854 m), weight 85.2 kg, SpO2 99 %.   Intake/Output Summary (Last 24 hours) at 11/30/2022 0815 Last data filed at 11/30/2022 0746 Gross per 24 hour  Intake --  Output 1800 ml  Net -1800 ml    Exam  Awake Alert, No new F.N deficits,    [place].AT,PERRAL Supple Neck,   Symmetrical Chest wall movement, Good air movement bilaterally, few fine rales RRR,No Gallops,   +ve B.Sounds, Abd Soft, Non tender,  No Cyanosis, Clubbing or edema    Data Review   Recent Labs  Lab 11/26/22 0907 11/27/22 0608 11/28/22 0410 11/29/22 0420 11/30/22 0404  WBC 7.0 17.7* 13.0* 12.8* 12.1*   HGB 14.4 15.7 12.6* 13.0 13.2  HCT 45.1 49.1 38.9* 39.5 40.7  PLT 197 188 119* 127* 148*  MCV 96.2 93.5 94.0 95.2 92.5  MCH 30.7 29.9 30.4 31.3 30.0  MCHC 31.9 32.0 32.4 32.9 32.4  RDW 14.8 14.7 14.5 14.6 14.3  LYMPHSABS 0.9  --  1.0 0.8 0.9  MONOABS 0.4  --  1.2* 0.7 0.6  EOSABS 0.1  --  0.0 0.0 0.0  BASOSABS 0.0  --  0.0 0.0 0.0    Recent Labs  Lab 11/26/22 0907 11/27/22 0430 11/27/22 0608 11/28/22 0410 11/29/22 0420 11/30/22 0404  NA 138  --  137 136 136 137  K 4.3  --  5.0 4.6 4.3 4.3  CL 103  --  100 103 97* 99  CO2 26  --  26 27 31  32  ANIONGAP 9  --  11 6 8 6   GLUCOSE 117*  --  123* 125* 131* 113*  BUN 30*  --  32* 39* 40* 38*  CREATININE 1.44*  --  1.69* 1.29* 1.33* 1.15  CRP  --   --  2.2* 2.5* 1.3* 0.7  PROCALCITON  --   --  <0.10 <0.10 <0.10 <0.10  BNP 975.0* 2,446.0*  --  1,318.6* 1,203.5* 1,098.7*  MG  --   --  2.1 2.1 2.0 2.2  CALCIUM 8.7*  --  9.1 8.5* 8.6* 8.7*    Total Time in preparing paper work,  data evaluation and todays exam - 35 minutes  Signature  -    Susa Raring M.D on 11/30/2022 at 8:15 AM   -  To page go to [email or web address]

## 2022-12-01 ENCOUNTER — Encounter (HOSPITAL_COMMUNITY): Payer: Self-pay | Admitting: Physical Medicine and Rehabilitation

## 2022-12-01 DIAGNOSIS — R5381 Other malaise: Secondary | ICD-10-CM | POA: Diagnosis not present

## 2022-12-01 LAB — CBC WITH DIFFERENTIAL/PLATELET
Abs Immature Granulocytes: 0.1 10*3/uL — ABNORMAL HIGH (ref 0.00–0.07)
Basophils Absolute: 0 10*3/uL (ref 0.0–0.1)
Basophils Relative: 0 %
Eosinophils Absolute: 0 10*3/uL (ref 0.0–0.5)
Eosinophils Relative: 0 %
HCT: 42.6 % (ref 39.0–52.0)
Hemoglobin: 13.8 g/dL (ref 13.0–17.0)
Immature Granulocytes: 1 %
Lymphocytes Relative: 10 %
Lymphs Abs: 1.2 10*3/uL (ref 0.7–4.0)
MCH: 30.2 pg (ref 26.0–34.0)
MCHC: 32.4 g/dL (ref 30.0–36.0)
MCV: 93.2 fL (ref 80.0–100.0)
Monocytes Absolute: 1 10*3/uL (ref 0.1–1.0)
Monocytes Relative: 9 %
Neutro Abs: 9.3 10*3/uL — ABNORMAL HIGH (ref 1.7–7.7)
Neutrophils Relative %: 80 %
Platelets: 158 10*3/uL (ref 150–400)
RBC: 4.57 MIL/uL (ref 4.22–5.81)
RDW: 14.3 % (ref 11.5–15.5)
WBC: 11.6 10*3/uL — ABNORMAL HIGH (ref 4.0–10.5)
nRBC: 0 % (ref 0.0–0.2)

## 2022-12-01 LAB — COMPREHENSIVE METABOLIC PANEL
ALT: 19 U/L (ref 0–44)
AST: 19 U/L (ref 15–41)
Albumin: 3.1 g/dL — ABNORMAL LOW (ref 3.5–5.0)
Alkaline Phosphatase: 67 U/L (ref 38–126)
Anion gap: 9 (ref 5–15)
BUN: 41 mg/dL — ABNORMAL HIGH (ref 8–23)
CO2: 32 mmol/L (ref 22–32)
Calcium: 8.9 mg/dL (ref 8.9–10.3)
Chloride: 97 mmol/L — ABNORMAL LOW (ref 98–111)
Creatinine, Ser: 1.21 mg/dL (ref 0.61–1.24)
GFR, Estimated: >60 mL/min (ref >60)
Glucose, Bld: 105 mg/dL — ABNORMAL HIGH (ref 70–99)
Potassium: 4.3 mmol/L (ref 3.5–5.1)
Sodium: 138 mmol/L (ref 135–145)
Total Bilirubin: 0.7 mg/dL (ref 0.3–1.2)
Total Protein: 5.8 g/dL — ABNORMAL LOW (ref 6.5–8.1)

## 2022-12-01 LAB — MAGNESIUM: Magnesium: 2.2 mg/dL (ref 1.7–2.4)

## 2022-12-01 MED ORDER — ACETAMINOPHEN 325 MG PO TABS
650.0000 mg | ORAL_TABLET | Freq: Three times a day (TID) | ORAL | Status: DC
  Administered 2022-12-01 – 2022-12-11 (×39): 650 mg via ORAL
  Filled 2022-12-01 (×39): qty 2

## 2022-12-01 NOTE — Progress Notes (Signed)
Inpatient Rehabilitation Admission Medication Review by a Pharmacist  A complete drug regimen review was completed for this patient to identify any potential clinically significant medication issues.  High Risk Drug Classes Is patient taking? Indication by Medication  Antipsychotic No   Anticoagulant Yes Sq heparin - VTE ppx  Antibiotic No   Opioid No   Antiplatelet Yes Aspirin, clopidogrel - s/p stent  Hypoglycemics/insulin No   Vasoactive Medication Yes Imdur, Metoprolol - CAD Pirfenidone-pulmonary fibrosis  Chemotherapy No   Other Yes Allopurinol - gout Atorvastatin - HLD Prednisone - pulmonary fibrosis Tizanidine - muscle spasms Benzonatate - prn cough Ondansetron - prn N/V Methocarbamol - prn spasms     Type of Medication Issue Identified Description of Issue Recommendation(s)  Drug Interaction(s) (clinically significant)     Duplicate Therapy     Allergy     No Medication Administration End Date     Incorrect Dose     Additional Drug Therapy Needed     Significant med changes from prior encounter (inform family/care partners about these prior to discharge).    Other       Clinically significant medication issues were identified that warrant physician communication and completion of prescribed/recommended actions by midnight of the next day:  No  Time spent performing this drug regimen review (minutes):  20 minutes Okey Regal, PharmD

## 2022-12-01 NOTE — Discharge Instructions (Addendum)
Inpatient Rehab Discharge Instructions  Maurice Munoz Discharge date and time: 12/11/2022  Activities/Precautions/ Functional Status: Activity: no lifting, driving, or strenuous exercise until cleared by MD Diet: cardiac diet Wound Care: keep wound clean and dry Functional status:  ___ No restrictions     ___ Walk up steps independently ___ 24/7 supervision/assistance   ___ Walk up steps with assistance ___ Intermittent supervision/assistance  ___ Bathe/dress independently ___ Walk with walker     ___ Bathe/dress with assistance ___ Walk Independently    ___ Shower independently ___ Walk with assistance    __x_ Shower with assistance _x__ No alcohol     ___ Return to work/school ________  Special Instructions: No driving, alcohol consumption or tobacco use.  Your oxygen levels have been stable on 2 liters with nasal cannula.  COMMUNITY REFERRALS UPON DISCHARGE:    Home Health:   PT     OT                    Agency: [facility]  Phone: [phone]    Medical Equipment/Items Ordered: Levan Hurst and Bedside Commode                                                  Agency/Supplier: [employer] [phone]   My questions have been answered and I understand these instructions. I will adhere to these goals and the provided educational materials after my discharge from the hospital.  Patient/Caregiver Signature _______________________________ Date __________  Clinician Signature _______________________________________ Date __________  Please bring this form and your medication list with you to all your follow-up doctor's appointments.

## 2022-12-01 NOTE — Progress Notes (Signed)
Inpatient Rehabilitation Care Coordinator Assessment and Plan Patient Details  Name: Maurice Munoz MRN: [id number] Date of Birth: 10-16-1945  Today's Date: 12/01/2022  Hospital Problems: Principal Problem:   Debility Active Problems:   Respiratory failure [facility])  Past Medical History:  Past Medical History:  Diagnosis Date   Adrenal adenoma, left    small   Aortic atherosclerosis (HCC)    Back pain    Bilateral carotid artery stenosis followed by dr Tresa Endo   per last carotid duplex 07-17-2014  -- proximal LICA 50-69% ,  RICA 0-49%   CAD (coronary artery disease)    a. s/p prior intervention to RCA and PDA in 1999 b. DES to LAD and distal RCA in 2007   Cardiomegaly    Stable   Chronic constipation    Chronic pain syndrome    CKD (chronic kidney disease), stage III (HCC)    DDD (degenerative disc disease), cervical    DDD (degenerative disc disease), lumbosacral    Dyspnea    Dyspnea on exertion    Gout    per pt last inflared episode 2015 approx.   Grade II diastolic dysfunction    H/O epistaxis    per pt sees ENT dr Suszanne Conners for cauterization (pt takes plavix)   History of acute pancreatitis 08/08/2014   History of kidney stones    HTN (hypertension)    Hyperlipidemia    Interstitial lung disease (HCC)    Left arm pain    s/p fall   Nocturia    Numbness of right foot    due to DDD lumbar   Peripheral edema    Pilonidal cyst    Pleural effusion on right    Pulmonary fibrosis (HCC)    followed by pcp-- last chest CT 04-08-2016   S/P drug eluting coronary stent placement 10/11/2005   mLAD and dRCA   Wears dentures    upper   Wears glasses    Past Surgical History:  Past Surgical History:  Procedure Laterality Date   ANTERIOR CERVICAL DECOMP/DISCECTOMY FUSION  12-25-2001     dr Terrilee Files [facility]   C3 -- C7   ANTERIOR CERVICAL DECOMP/DISCECTOMY FUSION  12-11-2012   dr Channing Mutters  [facility])   CARDIOVASCULAR STRESS TEST  08-23-2012    dr Tresa Endo   normal lexiscan  nuclear study w/ no ischemia/  normal LV function and wall motion, ef 65%   CARDIOVASCULAR STRESS TEST     CORONARY ANGIOPLASTY  1999   PTCA to RCA & PDA   CORONARY ANGIOPLASTY WITH STENT PLACEMENT  10-11-2005  dr [name]   PCI and stenting to midLAD & dRCA (Cypher DES x2)   CORONARY BALLOON ANGIOPLASTY N/A 04/14/2018   Procedure: CORONARY BALLOON ANGIOPLASTY;  Surgeon: Lennette Bihari, MD;  Location: [facility] INVASIVE CV LAB;  Service: Cardiovascular;  Laterality: N/A;   CORONARY STENT INTERVENTION N/A 04/14/2018   Procedure: CORONARY STENT INTERVENTION;  Surgeon: Lennette Bihari, MD;  Location: [facility] CV LAB;  Service: Cardiovascular;  Laterality: N/A;   CYSTOSCOPY W/ URETERAL STENT PLACEMENT Left 02-11-2010     [facility]   LEFT HEART CATH N/A 04/14/2018   Procedure: Left Heart Cath;  Surgeon: Lennette Bihari, MD;  Location[facility] CV LAB;  Service: Cardiovascular;  Laterality: N/A;   LEFT HEART CATH AND CORONARY ANGIOGRAPHY N/A 04/12/2018   Procedure: LEFT HEART CATH AND CORONARY ANGIOGRAPHY;  Surgeon: Corky Crafts, MD;  Location: [facility] INVASIVE CV LAB;  Service: Cardiovascular;  Laterality:  N/A;   LUMBAR LAMINECTOMY  2015   L5 -- S1   PILONIDAL CYST EXCISION  1980s   PILONIDAL CYST EXCISION N/A 03/31/2017   Procedure: EXCISION OF PILONIDAL DISEASE with Flap Rotation;  Surgeon: Romie Levee, MD;  Location: [facility];  Service: General;  Laterality: N/A;   RIGHT/LEFT HEART CATH AND CORONARY ANGIOGRAPHY N/A 10/18/2022   Procedure: RIGHT/LEFT HEART CATH AND CORONARY ANGIOGRAPHY;  Surgeon: Lennette Bihari, MD;  Location: [facility] CV [facility];  Service: Cardiovascular;  Laterality: N/A;   THROAT SURGERY  1996   per pt "removal piece of cryloid(?) that was pressing against throat"  states no issues since   TRANSTHORACIC ECHOCARDIOGRAM  08-24-2010  dr Tresa Endo   ef 50-55%/  mild MR/  trivial TR   WOUND DEBRIDEMENT N/A 10/06/2017   Procedure: DEBRIDEMENT WOUND PLACEMENT OF  WOUND HEALING MATRIX;  Surgeon: Romie Levee, MD;  Location: [facility] [place];  Service: General;  Laterality: N/A;   Social History:  reports that he quit smoking about 34 years ago. His smoking use included cigarettes. He has a 75.00 pack-year smoking history. He quit smokeless tobacco use about 33 years ago. He reports that he does not drink alcohol and does not use drugs.  Family / Support Systems Spouse/Significant Other: n/a Children: n/a Other Supports: Granddaughter, [name] and pt's brother Anticipated Caregiver: Granddaughter, [name] and Pt's brother Ability/Limitations of Caregiver: supervision Caregiver Availability: 24/7 Family Dynamics: support from family  Social History Preferred language: English Religion: [employer]an Cultural Background: Independent Education: HS Health Literacy - How often do you need to have someone help you when you read instructions, pamphlets, or other written material from your doctor or pharmacy?: Never Writes: Yes Employment Status: Retired Marine scientist Issues: n/a Guardian/Conservator: [name]   Abuse/Neglect Abuse/Neglect Assessment Can Be Completed: Yes Physical Abuse: Denies Verbal Abuse: Denies Sexual Abuse: Denies Exploitation of patient/patient's resources: Denies Self-Neglect: Denies  Patient response to: Social Isolation - How often do you feel lonely or isolated from those around you?: Never  Emotional Status Pt's affect, behavior and adjustment status: Pleasant Recent Psychosocial Issues: Coping Psychiatric History: n/a Substance Abuse History: n/a  Patient / Family Perceptions, Expectations & Goals Pt/Family understanding of illness & functional limitations: yes Premorbid pt/family roles/activities: Independent with Rw/SPC Anticipated changes in roles/activities/participation: Patient will require supervisioin/asssit from granddaughter or his brother 24/7 Pt/family expectations/goals:  Warehouse manager Agencies: None Premorbid Home Care/DME Agencies: Other (Comment) ([facility], SPC, Shower seat) Transportation available at discharge: family Is the patient able to respond to transportation needs?: Yes In the past 12 months, has lack of transportation kept you from meetings, work, or from getting things needed for daily living?: No  Discharge Planning Living Arrangements: Alone Support Systems: Other relatives (Granddaughter and brother) Type of Residence: Private residence (1 level home, 1 step) Insurance Resources: Electrical engineer Resources: Restaurant manager, fast food Screen Referred: No Living Expenses: Own Money Management: Patient Does the patient have any problems obtaining your medications?: No Home Management: Independent Patient/Family Preliminary Plans: Granddaughter or brother able to assist Care Coordinator Barriers to Discharge: Wound Care, Lack of/limited family support, Decreased caregiver support Care Coordinator Anticipated Follow Up Needs: HH/OP Expected length of stay: 10-12 Days  Clinical Impression  SW met with patient, introduced self and explained role. Patient plans to discharge home with assistance from his granddaughter and his brother. Patient lives in a 1 level home,1 step up to enter. SW will continue to FU with pt and family  on progress and tentative discharge date  Andria Rhein 12/01/2022, 2:40 PM

## 2022-12-01 NOTE — Evaluation (Signed)
Occupational Therapy Assessment and Plan  Patient Details  Name: Maurice Munoz MRN: [id number] Date of Birth: 08/10/1945  OT Diagnosis: abnormal posture, acute pain, muscle weakness (generalized), pain in joint, and swelling of limb Rehab Potential: Rehab Potential (ACUTE ONLY): Good ELOS: 7-10 d   Today's Date: 12/01/2022 OT Individual Time: 1045-1200 75 min        Hospital Problem: Principal Problem:   Debility Active Problems:   Respiratory failure (HCC)   Past Medical History:  Past Medical History:  Diagnosis Date   Adrenal adenoma, left    small   Aortic atherosclerosis (HCC)    Back pain    Bilateral carotid artery stenosis followed by dr Tresa Endo   per last carotid duplex 07-17-2014  -- proximal LICA 50-69% ,  RICA 0-49%   CAD (coronary artery disease)    a. s/p prior intervention to RCA and PDA in 1999 b. DES to LAD and distal RCA in 2007   Cardiomegaly    Stable   Chronic constipation    Chronic pain syndrome    CKD (chronic kidney disease), stage III (HCC)    DDD (degenerative disc disease), cervical    DDD (degenerative disc disease), lumbosacral    Dyspnea    Dyspnea on exertion    Gout    per pt last inflared episode 2015 approx.   Grade II diastolic dysfunction    H/O epistaxis    per pt sees ENT dr Suszanne Conners for cauterization (pt takes plavix)   History of acute pancreatitis 08/08/2014   History of kidney stones    HTN (hypertension)    Hyperlipidemia    Interstitial lung disease (HCC)    Left arm pain    s/p fall   Nocturia    Numbness of right foot    due to DDD lumbar   Peripheral edema    Pilonidal cyst    Pleural effusion on right    Pulmonary fibrosis (HCC)    followed by pcp-- last chest CT 04-08-2016   S/P drug eluting coronary stent placement 10/11/2005   mLAD and dRCA   Wears dentures    upper   Wears glasses    Past Surgical History:  Past Surgical History:  Procedure Laterality Date   ANTERIOR CERVICAL DECOMP/DISCECTOMY FUSION   12-25-2001     dr Terrilee Files [facility]   C3 -- C7   ANTERIOR CERVICAL DECOMP/DISCECTOMY FUSION  12-11-2012   dr Channing Mutters  [facility])   CARDIOVASCULAR STRESS TEST  08-23-2012    dr Tresa Endo   normal lexiscan nuclear study w/ no ischemia/  normal LV function and wall motion, ef 65%   CARDIOVASCULAR STRESS TEST     CORONARY ANGIOPLASTY  1999   PTCA to RCA & PDA   CORONARY ANGIOPLASTY WITH STENT PLACEMENT  10-11-2005  dr [name]   PCI and stenting to midLAD & dRCA (Cypher DES x2)   CORONARY BALLOON ANGIOPLASTY N/A 04/14/2018   Procedure: CORONARY BALLOON ANGIOPLASTY;  Surgeon: Lennette Bihari, MD;  Location: [facility] INVASIVE CV LAB;  Service: Cardiovascular;  Laterality: N/A;   CORONARY STENT INTERVENTION N/A 04/14/2018   Procedure: CORONARY STENT INTERVENTION;  Surgeon: Lennette Bihari, MD;  Location: [facility] INVASIVE CV LAB;  Service: Cardiovascular;  Laterality: N/A;   CYSTOSCOPY W/ URETERAL STENT PLACEMENT Left 02-11-2010     [facility]   LEFT HEART CATH N/A 04/14/2018   Procedure: Left Heart Cath;  Surgeon: Lennette Bihari, MD;  Location: [facility] INVASIVE CV LAB;  Service:  Cardiovascular;  Laterality: N/A;   LEFT HEART CATH AND CORONARY ANGIOGRAPHY N/A 04/12/2018   Procedure: LEFT HEART CATH AND CORONARY ANGIOGRAPHY;  Surgeon: Corky Crafts, MD;  Location: [facility] INVASIVE CV LAB;  Service: Cardiovascular;  Laterality: N/A;   LUMBAR LAMINECTOMY  2015   L5 -- S1   PILONIDAL CYST EXCISION  1980s   PILONIDAL CYST EXCISION N/A 03/31/2017   Procedure: EXCISION OF PILONIDAL DISEASE with Flap Rotation;  Surgeon: Romie Levee, MD;  Location[facility];  Service: General;  Laterality: N/A;   RIGHT/LEFT HEART CATH AND CORONARY ANGIOGRAPHY N/A 10/18/2022   Procedure: RIGHT/LEFT HEART CATH AND CORONARY ANGIOGRAPHY;  Surgeon: Lennette Bihari, MD;  Location: [facility] INVASIVE CV LAB;  Service: Cardiovascular;  Laterality: N/A;   THROAT SURGERY  1996   per pt "removal piece of cryloid(?) that was pressing  against throat"  states no issues since   TRANSTHORACIC ECHOCARDIOGRAM  08-24-2010  dr Tresa Endo   ef 50-55%/  mild MR/  trivial TR   WOUND DEBRIDEMENT N/A 10/06/2017   Procedure: DEBRIDEMENT WOUND PLACEMENT OF WOUND HEALING MATRIX;  Surgeon: Romie Levee, MD;  Location: [facility];  Service: General;  Laterality: N/A;    Assessment & Plan Clinical Impression:   Maurice Munoz is a 77 year old male with a past medical history of interstitial lung disease dating back to at least 2016 who presented to the emergency department at [facility] on 11/26/2022 with recurrent syncope and worsening dyspnea on exertion.  The patient reported that he did not check his oxygen frequently at home and was not wearing his oxygen as prescribed or recommended.  As part of his workup he underwent CTA of head and neck with evidence of incidental complete right ICA occlusion.  CTA with PE protocol was negative for PE but did demonstrate severe IPF and honeycombing, interlobar septal thickening and diffuse central groundglass opacities with bilateral pleural effusions.  Felt to be concerning for pulmonary edema and cardiogenic volume overload.  He was transferred from [facility] to [facility] for further evaluation.  MRI brain non-acute. Pulmonology service was consulted.  BNP was 2,446.  Aggressive diuresis of at least 1 L daily was recommended with close monitoring of blood pressure and kidney function.  O2 saturation goal of 88%'s weaning as tolerated.  He was started on IV Solu-Medrol for 3 days and will continue with prednisone taper.  TTE was updated. EF ~55-60%.  He is tolerating a heart healthy diet.  BUN today elevated at 38 creatinine normal at 1.15.  Urine potassium and magnesium have been normal.  BNP 1098.  Cytosis continues to improve.  Mild thrombocytopenia.  Oxygen saturation levels have been normal on 3 L oxygen via nasal cannula. The patient requires inpatient medicine and rehabilitation evaluations  and services for ongoing dysfunction secondary to acute on chronic hypoxemic respiratory failure.   Recall history significant for PCI x 5 and demonstrated nonobstructive CAD.  Demonstration of pulmonary hypertension with PVR of 6.89, LVEDP of 15.  He was prescribed Lasix.  He follows with Dr. Tresa Endo.   Has had multiple spine surgeries, non-healing pilonidal cyst with chronic pain. No tobacco or alcohol use. Lives alone and has friends who provide support as well as several step-children.   Patient currently requires mod with basic self-care skills and IADL secondary to muscle weakness and muscle joint tightness, decreased cardiorespiratoy endurance and decreased oxygen support, decreased motor planning, and decreased standing balance, decreased postural control, decreased balance strategies, and difficulty  maintaining precautions.  Prior to hospitalization, patient could complete BADL's with modified independent .  Patient will benefit from skilled intervention to decrease level of assist with basic self-care skills, increase independence with basic self-care skills, and increase level of independence with iADL prior to discharge home with care partner.  Anticipate patient will require intermittent supervision from brother initilally for IADL's as previous and follow up home health.  OT - End of Session Activity Tolerance: Decreased this session Endurance Deficit: Yes Endurance Deficit Description: continuous O2 compared to PRN prior to admission OT Assessment Rehab Potential (ACUTE ONLY): Good OT Barriers to Discharge: Inaccessible home environment;Decreased caregiver support;Wound Care OT Barriers to Discharge Comments: wounds, O2, lives alone OT Patient demonstrates impairments in the following area(s): Balance;Nutrition;Pain;Edema;Endurance;Sensory;Motor;Skin Integrity OT Basic ADL's Functional Problem(s): Grooming;Bathing;Dressing;Toileting OT Advanced ADL's Functional Problem(s): Simple  Meal Preparation;Light Housekeeping OT Transfers Functional Problem(s): Toilet;Tub/Shower OT Additional Impairment(s): None OT Plan OT Intensity: Minimum of 1-2 x/day, 45 to 90 minutes OT Frequency: 5 out of 7 days OT Duration/Estimated Length of Stay: 7-10 d OT Treatment/Interventions: Balance/vestibular training;Community reintegration;Disease mangement/prevention;Neuromuscular re-education;Patient/family education;Self Care/advanced ADL retraining;Splinting/orthotics;Therapeutic Exercise;UE/LE Coordination activities;Wheelchair propulsion/positioning;Discharge planning;DME/adaptive equipment instruction;Functional mobility training;Pain management;Psychosocial support;Skin care/wound managment;Therapeutic Activities;UE/LE Strength taining/ROM OT Self Feeding Anticipated Outcome(s): mod I OT Basic Self-Care Anticipated Outcome(s): mod I OT Toileting Anticipated Outcome(s): mod I OT Bathroom Transfers Anticipated Outcome(s): mod I OT Recommendation Recommendations for Other Services: Therapeutic Recreation consult Therapeutic Recreation Interventions: Pet therapy;Kitchen group;Stress management;Outing/community reintergration Patient destination: Home Follow Up Recommendations: Home health OT Equipment Recommended: Rolling walker with 5" wheels Equipment Details: may benefit from rollator   OT Evaluation Precautions/Restrictions  Precautions Precautions: Fall Precaution Comments: watch O2 and BP Restrictions Weight Bearing Restrictions: No General Chart Reviewed: Yes Family/Caregiver Present: No Vital Signs Therapy Vitals Temp: 97.8 F (36.6 C) Pulse Rate: 69 Resp: 17 BP: (!) 136/91 Patient Position (if appropriate): Lying Oxygen Therapy SpO2: 99 % O2 Device: Nasal Cannula Pain Pain Assessment Pain Scale: 0-10 Pain Score: 4  Pain Type: Acute pain Pain Location: Knee Pain Orientation: Right Pain Descriptors / Indicators: Aching Pain Onset: On-going Patients Stated  Pain Goal: 1 Pain Intervention(s): Ambulation/increased activity;Rest;Repositioned Multiple Pain Sites: Yes 2nd Pain Site Pain Score: 4 Pain Type: Acute pain Pain Location: Hand Pain Orientation: Right Pain Descriptors / Indicators: Aching Pain Onset: On-going Patient's Stated Pain Goal: 1 Home Living/Prior Functioning Home Living Family/patient expects to be discharged to:: Private residence Living Arrangements: Alone Available Help at Discharge: Family, Friend(s), Available 24 hours/day Type of Home: House Home Access: Stairs to enter [employer] of Steps: 1 step >12" tall Entrance Stairs-Rails: None Home Layout: One level Bathroom Shower/Tub: Health visitor: Handicapped height Bathroom Accessibility: Yes  Lives With: Alone IADL History Homemaking Responsibilities: Yes Meal Prep Responsibility: Secondary Laundry Responsibility: Secondary Cleaning Responsibility: Secondary Homemaking Comments: has a small dog Current License: Yes Mode of TransportationGames developer Education: 8th grad Occupation: Retired Type of Occupation: [employer] Leisure and Hobbies: fishing, Training and development officer, classic cars, singing and guitar, songwriting IADL Comments: brother comes to assist and takses to grocery Prior Function Level of Independence: Independent with gait, Independent with basic ADLs, Needs assistance with homemaking  Able to Take Stairs?: Yes Driving: Yes Vocation: Retired Administrator, sports Baseline Vision/History: 1 Wears glasses Ability to See in Adequate Light: 0 Adequate Patient Visual Report: No change from baseline Vision Assessment?: Yes Perception  Perception: Within Functional Limits Praxis Praxis: Intact Cognition Cognition Overall Cognitive Status: Within Functional Limits for tasks assessed Arousal/Alertness: Awake/alert Memory: Appears  intact Awareness: Appears intact Problem Solving: Appears intact Safety/Judgment: Appears intact Brief  Interview for Mental Status (BIMS) Repetition of Three Words (First Attempt): 3 Temporal Orientation: Year: Correct Temporal Orientation: Month: Accurate within 5 days Temporal Orientation: Day: Correct Recall: "Sock": Yes, no cue required Recall: "Blue": Yes, no cue required Recall: "Bed": Yes, no cue required BIMS Summary Score: 15 Sensation Sensation Light Touch: Impaired by gross assessment Hot/Cold: Appears Intact Proprioception: Appears Intact Stereognosis: Appears Intact Additional Comments: BLE neuropathy Coordination Gross Motor Movements are Fluid and Coordinated: Yes Fine Motor Movements are Fluid and Coordinated: Yes Finger Nose Finger Test: WFL's Motor  Motor Motor: Within Functional Limits;Other (comment) Motor - Skilled Clinical Observations: slow 2/2 debility  Trunk/Postural Assessment  Cervical Assessment Cervical Assessment: Within Functional Limits Thoracic Assessment Thoracic Assessment: Within Functional Limits Lumbar Assessment Lumbar Assessment: Within Functional Limits Postural Control Postural Control: Within Functional Limits  Balance Balance Balance Assessed: Yes Dynamic Sitting Balance Dynamic Sitting - Balance Support: No upper extremity supported;Feet supported Dynamic Sitting - Level of Assistance: 5: Stand by assistance Dynamic Sitting - Balance Activities: Reaching for objects Dynamic Standing Balance Dynamic Standing - Balance Support: Bilateral upper extremity supported Dynamic Standing - Level of Assistance: 4: Min assist Dynamic Standing - Balance Activities: Reaching for objects Extremity/Trunk Assessment RUE Assessment RUE Assessment: Within Functional Limits LUE Assessment LUE Assessment: Within Functional Limits  Care Tool Care Tool Self Care Eating   Eating Assist Level: Set up assist    Oral Care    Oral Care Assist Level: Set up assist    Bathing   Body parts bathed by patient: Right arm;Left  arm;Chest;Abdomen;Front perineal area;Buttocks;Right upper leg;Left upper leg Body parts bathed by helper: Right lower leg;Left lower leg   Assist Level: Maximal Assistance - Patient 24 - 49%    Upper Body Dressing(including orthotics)   What is the patient wearing?: Button up shirt   Assist Level: Moderate Assistance - Patient 50 - 74%    Lower Body Dressing (excluding footwear)   What is the patient wearing?: Underwear/pull up;Pants Assist for lower body dressing: Maximal Assistance - Patient 25 - 49%    Putting on/Taking off footwear   What is the patient wearing?: Socks Assist for footwear: Total Assistance - Patient < 25%       Care Tool Toileting Toileting activity   Assist for toileting: Moderate Assistance - Patient 50 - 74%     Care Tool Bed Mobility Roll left and right activity   Roll left and right assist level: Supervision/Verbal cueing    Sit to lying activity   Sit to lying assist level: Supervision/Verbal cueing    Lying to sitting on side of bed activity   Lying to sitting on side of bed assist level: the ability to move from lying on the back to sitting on the side of the bed with no back support.: Supervision/Verbal cueing     Care Tool Transfers Sit to stand transfer   Sit to stand assist level: Minimal Assistance - Patient > 75%    Chair/bed transfer   Chair/bed transfer assist level: Minimal Assistance - Patient > 75%     Toilet transfer   Assist Level: Minimal Assistance - Patient > 75%     Care Tool Cognition  Expression of Ideas and Wants Expression of Ideas and Wants: 4. Without difficulty (complex and basic) - expresses complex messages without difficulty and with speech that is clear and easy to understand  Understanding Verbal and Non-Verbal Content Understanding  Verbal and Non-Verbal Content: 4. Understands (complex and basic) - clear comprehension without cues or repetitions   Memory/Recall Ability Memory/Recall Ability : Current  season;That he or she is in a hospital/hospital unit   Refer to Care Plan for Long Term Goals  SHORT TERM GOAL WEEK 1 OT Short Term Goal 1 (Week 1): STG=LTG's d/t LOS  Recommendations for other services: Therapeutic Recreation  Pet therapy, Kitchen group, Stress management, and Outing/community reintegration   Skilled Therapeutic Intervention ADL ADL ADL Comments: min A UB, max- mod A LB, min A txfrs with SW- may be open to RW for respiratory even rollator Mobility  Bed Mobility Bed Mobility: Rolling Right;Right Sidelying to Sit Rolling Right: Supervision/verbal cueing Right Sidelying to Sit: Supervision/Verbal cueing Transfers Sit to Stand: Minimal Assistance - Patient > 75% Stand to Sit: Minimal Assistance - Patient > 75% OT Intervention/Treatment:  Pt seen for full initial OT evaluation and training session this am. Pt in w/c upon OT arrival. OT introduced role of therapy and purpose of session. Pt  open to all presented assessment and training this visit. OT assisted and assessed ADL's, mobility, vision, sensation. cognition/lang, G/FMC, strength and balance throughout session. See above for levels. Pt  educated on O2 mngt, energy conservation and breathing integ as wel as demo apt stall shower transfer safety. Pt will benefit from skilled OT services at [facility] to maximize function and safety with recommendation to return home with mod I for BADL's and S for higher level activity with HHOT services upon d/c home. Pt left at end of session in recliner with LE's elevated and chair alarm set, tray table and nurse call bell within reach.   Discharge Criteria: Patient will be discharged from OT if patient refuses treatment 3 consecutive times without medical reason, if treatment goals not met, if there is a change in medical status, if patient makes no progress towards goals or if patient is discharged from hospital.  The above assessment, treatment plan, treatment alternatives and goals were  discussed and mutually agreed upon: by patient  Vicenta Dunning 12/01/2022, 9:21 PM

## 2022-12-01 NOTE — Discharge Summary (Signed)
Physician Discharge Summary  Patient ID: LAWRENCE MITCH MRN: [id number] DOB/AGE: [birth year] 77 y.o.  Admit date: 11/30/2022 Discharge date: 12/11/2022  Discharge Diagnoses:  Principal Problem:   Debility Active Problems:   Respiratory failure (HCC) Debility secondary to respiratory failure Right knee pain Skin tears Hypertension History of gout Hyperlipidemia Interstitial lung disease, chronic hypoxemia Pulmonary hypertension Right heart failure Acute kidney injury atop chronic kidney disease stage IIIb Coronary artery disease Carotid stenosis  Discharged Condition: stable  Significant Diagnostic Studies:  Narrative & Impression  CLINICAL DATA:  Pain.   EXAM: RIGHT KNEE - COMPLETE 4+ VIEW   COMPARISON:  Right knee radiographs 02/26/2021   FINDINGS: There is diffuse decreased bone mineralization. Mild-to-moderate medial compartment joint space narrowing is similar to prior.   Large chronic degenerative enthesophytes at the superior tibial tubercle greater than the inferior aspect of the patella at the distal insertion and proximal origin of the patellar tendon, respectively. This does not significant changed from prior. Minimal chronic enthesopathic change at the quadriceps insertion on the patella. No acute fracture or dislocation. No joint effusion. Mild-to-moderate atherosclerotic calcifications.   IMPRESSION: 1. Mild-to-moderate medial compartment joint space narrowing. 2. Large chronic degenerative enthesophytes at the superior tibial tubercle and inferior aspect of the patella.     Electronically Signed   By: Neita Garnet M.D.   On: 12/02/2022 13:05    Labs:  Basic Metabolic Panel: Recent Labs  Lab 12/10/22 0658  NA 136  K 4.8  CL 94*  CO2 33*  GLUCOSE 79  BUN 34*  CREATININE 1.42*  CALCIUM 9.4    CBC: No results for input(s): "WBC", "NEUTROABS", "HGB", "HCT", "MCV", "PLT" in the last 168 hours.   Brief HPI:   Maurice Munoz is a  77 y.o. male with a past medical history of interstitial lung disease dating back to at least 2016 who presented to the emergency department at [facility] on 11/26/2022 with recurrent syncope and worsening dyspnea on exertion. The patient reported that he did not check his oxygen frequently at home and was not wearing his oxygen as prescribed or recommended. As part of his workup he underwent CTA of head and neck with evidence of incidental complete right ICA occlusion. CTA with PE protocol was negative for PE but did demonstrate severe IPF and honeycombing, interlobar septal thickening and diffuse central groundglass opacities with bilateral pleural effusions. Felt to be concerning for pulmonary edema and cardiogenic volume overload. He was transferred from [facility] to [facility] for further evaluation. MRI brain non-acute. Pulmonology service was consulted. BNP was 2,446. Aggressive diuresis of at least 1 L daily was recommended with close monitoring of blood pressure and kidney function. O2 saturation goal of 88%'s weaning as tolerated. He was started on IV Solu-Medrol for 3 days and will continue with prednisone taper. TTE was updated. EF ~55-60%. He is tolerating a heart healthy diet. BUN today elevated at 38 creatinine normal at 1.15. Urine potassium and magnesium have been normal. BNP 1098. Cytosis continues to improve. Mild thrombocytopenia. Oxygen saturation levels have been normal on 3 L oxygen via nasal cannula.    Hospital Course: COLBY REELS was admitted to rehab 11/30/2022 for inpatient therapies to consist of PT, ST and OT at least three hours five days a week. Past admission physiatrist, therapy team and rehab RN have worked together to provide customized collaborative inpatient rehab. Follow-up labs with BUN of 41/creatinine 1.21, not on diuretics. Ice, tramadol for right knee pain. Lasix 10  mg daily started for BLE edema. X-rays of knees obtained on 6/13>>no acute changes. Started Lasix 20  mg daily on 6/14. Breathing improved>>using 2L O2 via [place]. AKI resolved. Weight stable. One episode of tachycardia with OT/PT on 6/21. Endorsed fatigue. Call into Dr. Jens Som and EKG performed. No progress note placed on day of discharge, 6/22, by provider.  Blood pressures were monitored on TID basis and Imdur 30 mg q HS and Lopressor 25 mg BID continued. Lopressor held 6/14 due to hypotension>>discontinued and started Toprol XL 25 mg at HS.  Rehab course: During patient's stay in rehab weekly team conferences were held to monitor patient's progress, set goals and discuss barriers to discharge. At admission, patient required min with mobility and mod with basic self-care skills and IADL.  He has had improvement in activity tolerance, balance, postural control as well as ability to compensate for deficits. He has had improvement in functional use RUE/LUE  and RLE/LLE as well as improvement in awareness. 6/19>Pt then completed amb of 151ft using S + RW. Pt then completed blocked practice of amb of 89ft with O2 cord management. Pt demonstrated ability to manage cord safely with S + RW. Pt then completed blocked practice of 1108ft amb with S + RW and good awareness of need to rest between rounds.  Home health PT and OT arranged.  Disposition: Home There are no questions and answers to display.      Diet: heart healthy  Special Instructions: No driving, alcohol consumption or tobacco use.  Need to make arrangements through PCP for carotid ultrasound surveillance.  Oxygen has been stable at 2 liters on nasal cannula. Continue continuous use at home.  Discharge Instructions     Ambulatory referral to Physical Medicine Rehab   Complete by: As directed    Hospital follow-up      Allergies as of 12/11/2022       Reactions   Ibuprofen Itching   Motrin brand   Neosporin [neomycin-bacitracin Zn-polymyx] Swelling   Penicillins Rash    Has patient had a PCN reaction causing immediate rash,  facial/tongue/throat swelling, SOB or lightheadedness with hypotension: No Has patient had a PCN reaction causing severe rash involving mucus membranes or skin necrosis: No Has patient had a PCN reaction that required hospitalization: No Has patient had a PCN reaction occurring within the last 10 years: No If all of the above answers are "NO", then may proceed with Cephalosporin use.        Medication List     STOP taking these medications    metoprolol tartrate 50 MG tablet Commonly known as: LOPRESSOR   OXYGEN   predniSONE 20 MG tablet Commonly known as: DELTASONE   torsemide 10 MG tablet Commonly known as: DEMADEX       TAKE these medications    acetaminophen 325 MG tablet Commonly known as: TYLENOL Take 2 tablets (650 mg total) by mouth 4 (four) times daily -  before meals and at bedtime. What changed:  medication strength how much to take when to take this reasons to take this   allopurinol 300 MG tablet Commonly known as: ZYLOPRIM Take 300 mg by mouth in the morning.   aspirin EC 81 MG tablet Take 81 mg by mouth in the morning.   atorvastatin 80 MG tablet Commonly known as: LIPITOR TAKE ONE TABLET (80MG  TOTAL) BY MOUTH AT6PM What changed: See the new instructions.   clopidogrel 75 MG tablet Commonly known as: PLAVIX TAKE ONE (1) TABLET BY MOUTH  EVERY DAY   cyanocobalamin 1000 MCG tablet Commonly known as: VITAMIN B12 Take 1,000 mcg by mouth in the morning.   Esbriet 801 MG Tabs Generic drug: Pirfenidone Take 1 tablet (801 mg total) by mouth 3 (three) times daily with meals. What changed:  how much to take when to take this additional instructions   furosemide 20 MG tablet Commonly known as: LASIX Take 1/2 tablet (10 mg total) by mouth daily.   isosorbide mononitrate 30 MG 24 hr tablet Commonly known as: IMDUR Take 1 tablet (30 mg total) by mouth every morning.   metoprolol succinate 25 MG 24 hr tablet Commonly known as: TOPROL-XL Take  1 tablet (25 mg total) by mouth at bedtime.   nitroGLYCERIN 0.4 MG SL tablet Commonly known as: NITROSTAT Place 1 tablet (0.4 mg total) under the tongue every 5 (five) minutes as needed for chest pain.   tiZANidine 4 MG tablet Commonly known as: ZANAFLEX Take 4 mg by mouth at bedtime.   VITAMIN D PO Take 5,000 Units by mouth in the morning.        Follow-up Information     [name], MD Follow up.   Specialty: Internal Medicine Why: Call the office in 1-2 days to make arrangements for hospital follow-up appointment. Need to get carotid ultrasound arranged. Contact information: [address] [place] [address] [phone]         Horton Chin, MD Follow up.   Specialty: Physical Medicine and Rehabilitation Why: office will call you to arrange your appt (sent) Contact information: [address] [place] [address] [phone]         Chilton Greathouse, MD. Go to.   Specialty: Pulmonary Disease Contact information: [address] [place] [address] [phone]         Lennette Bihari, MD Follow up.   Specialty: Cardiology Why: Call the office in 1-2 days to make arrangements for hospital follow-up appointment. Contact information: [address] [place] [address] [phone]                 Signed: Milinda Antis 12/13/2022, 12:08 PM

## 2022-12-01 NOTE — Plan of Care (Signed)
  Problem: RH Balance Goal: LTG Patient will maintain dynamic standing with ADLs (OT) Description: LTG:  Patient will maintain dynamic standing balance with assist during activities of daily living (OT)  Flowsheets (Taken 12/01/2022 1616) LTG: Pt will maintain dynamic standing balance during ADLs with: Independent with assistive device   Problem: Sit to Stand Goal: LTG:  Patient will perform sit to stand in prep for activites of daily living with assistance level (OT) Description: LTG:  Patient will perform sit to stand in prep for activites of daily living with assistance level (OT) Flowsheets (Taken 12/01/2022 1616) LTG: PT will perform sit to stand in prep for activites of daily living with assistance level: Independent with assistive device   Problem: RH Grooming Goal: LTG Patient will perform grooming w/assist,cues/equip (OT) Description: LTG: Patient will perform grooming with assist, with/without cues using equipment (OT) Flowsheets (Taken 12/01/2022 1616) LTG: Pt will perform grooming with assistance level of: Independent with assistive device    Problem: RH Bathing Goal: LTG Patient will bathe all body parts with assist levels (OT) Description: LTG: Patient will bathe all body parts with assist levels (OT) Flowsheets (Taken 12/01/2022 1616) LTG: Pt will perform bathing with assistance level/cueing: Independent with assistive device    Problem: RH Dressing Goal: LTG Patient will perform upper body dressing (OT) Description: LTG Patient will perform upper body dressing with assist, with/without cues (OT). Flowsheets (Taken 12/01/2022 1616) LTG: Pt will perform upper body dressing with assistance level of: Independent with assistive device Goal: LTG Patient will perform lower body dressing w/assist (OT) Description: LTG: Patient will perform lower body dressing with assist, with/without cues in positioning using equipment (OT) Flowsheets (Taken 12/01/2022 1616) LTG: Pt will perform  lower body dressing with assistance level of: Independent with assistive device   Problem: RH Toileting Goal: LTG Patient will perform toileting task (3/3 steps) with assistance level (OT) Description: LTG: Patient will perform toileting task (3/3 steps) with assistance level (OT)  Flowsheets (Taken 12/01/2022 1616) LTG: Pt will perform toileting task (3/3 steps) with assistance level: Independent with assistive device   Problem: RH Simple Meal Prep Goal: LTG Patient will perform simple meal prep w/assist (OT) Description: LTG: Patient will perform simple meal prep with assistance, with/without cues (OT). Flowsheets (Taken 12/01/2022 1616) LTG: Pt will perform simple meal prep with assistance level of: Independent with assistive device   Problem: RH Toilet Transfers Goal: LTG Patient will perform toilet transfers w/assist (OT) Description: LTG: Patient will perform toilet transfers with assist, with/without cues using equipment (OT) Flowsheets (Taken 12/01/2022 1616) LTG: Pt will perform toilet transfers with assistance level of: Independent with assistive device   Problem: RH Tub/Shower Transfers Goal: LTG Patient will perform tub/shower transfers w/assist (OT) Description: LTG: Patient will perform tub/shower transfers with assist, with/without cues using equipment (OT) Flowsheets (Taken 12/01/2022 1616) LTG: Pt will perform tub/shower stall transfers with assistance level of: Independent with assistive device

## 2022-12-01 NOTE — Progress Notes (Signed)
Physical Therapy Session Note  Patient Details  Name: Maurice Munoz MRN: [id number] Date of Birth: 1945-11-17  Today's Date: 12/01/2022 PT Individual Time: 1346-1446 PT Individual Time Calculation (min): 60 min   Short Term Goals: Week 1:  PT Short Term Goal 1 (Week 1): STG = LTG 2/2 LOS  Skilled Therapeutic Interventions/Progress Updates:  Received pt sitting in WC, pt agreeable to PT treatment, and reported pain 7/10 in R knee and generalized pain all over from numerous injuries (premedicated). Session with emphasis on functional mobility/transfers, generalized strengthening and endurance, dynamic standing balance/coordination, and gait training. Pt on 3L O2 via [place] with SPO2 99% and remained >93% with activity. Pt transported to/from room in [facility] dependently for time management purposes.   Pt transferred WC<>Nustep with standard walker and CGA, decreased to 2L O2, and pt performed BUE/BLE strengthening on Nustep at workload 2 for 8 minutes for a total of 600 steps with emphasis on cardiovascular endurance. Pt required 2 rest breaks due to SOB - SPO2 dropped to 87% but recovered to >92% with rest and pursed lip breathing. Stood from [employer] with standard walker and CGA and ambulated 78ft with standard walker and CGA with total A to manage O2 tank - cues for walker safety. SPO2 dropped to 87%, increasing to 92% with seated rest and pursed lip breathing. Pt exerting a lot of unnecessary effort using standard walker and suggested trying RW for energy conservation, however pt prefers standard walker at this time. During rest breaks, pt actively singing and telling jokes to therapist and patients in gym, engaging in social interaction to uplift spirits. Pt transferred mat<>WC stand<>pivot with standard walker and CGA and returned to room. Concluded session with pt sitting in WC, needs within reach, and seatbelt alarm on. Pt left on 2L O2 with SPO2 95% - RN aware.   Therapy Documentation Precautions:   Restrictions Weight Bearing Restrictions: No  Therapy/Group: Individual Therapy [name] [name] PT, DPT 12/01/2022, 7:27 AM

## 2022-12-01 NOTE — Evaluation (Signed)
Physical Therapy Assessment and Plan  Patient Details  Name: Maurice Munoz MRN: [id number] Date of Birth: 09-14-1945  PT Diagnosis: Difficulty walking, Muscle weakness, and Pain in joint Rehab Potential: Good ELOS: 7-10 days   Today's Date: 12/01/2022 PT Individual Time: 0900-1010 PT Individual Time Calculation (min): 70 min    Hospital Problem: Principal Problem:   Debility Active Problems:   Respiratory failure (HCC)   Past Medical History:  Past Medical History:  Diagnosis Date   Adrenal adenoma, left    small   Aortic atherosclerosis (HCC)    Back pain    Bilateral carotid artery stenosis followed by dr Tresa Endo   per last carotid duplex 07-17-2014  -- proximal LICA 50-69% ,  RICA 0-49%   CAD (coronary artery disease)    a. s/p prior intervention to RCA and PDA in 1999 b. DES to LAD and distal RCA in 2007   Cardiomegaly    Stable   Chronic constipation    Chronic pain syndrome    CKD (chronic kidney disease), stage III (HCC)    DDD (degenerative disc disease), cervical    DDD (degenerative disc disease), lumbosacral    Dyspnea    Dyspnea on exertion    Gout    per pt last inflared episode 2015 approx.   Grade II diastolic dysfunction    H/O epistaxis    per pt sees ENT dr Suszanne Conners for cauterization (pt takes plavix)   History of acute pancreatitis 08/08/2014   History of kidney stones    HTN (hypertension)    Hyperlipidemia    Interstitial lung disease (HCC)    Left arm pain    s/p fall   Nocturia    Numbness of right foot    due to DDD lumbar   Peripheral edema    Pilonidal cyst    Pleural effusion on right    Pulmonary fibrosis (HCC)    followed by pcp-- last chest CT 04-08-2016   S/P drug eluting coronary stent placement 10/11/2005   mLAD and dRCA   Wears dentures    upper   Wears glasses    Past Surgical History:  Past Surgical History:  Procedure Laterality Date   ANTERIOR CERVICAL DECOMP/DISCECTOMY FUSION  12-25-2001     dr Terrilee Files [facility]    C3 -- C7   ANTERIOR CERVICAL DECOMP/DISCECTOMY FUSION  12-11-2012   dr Channing Mutters  [facility])   CARDIOVASCULAR STRESS TEST  08-23-2012    dr Tresa Endo   normal lexiscan nuclear study w/ no ischemia/  normal LV function and wall motion, ef 65%   CARDIOVASCULAR STRESS TEST     CORONARY ANGIOPLASTY  1999   PTCA to RCA & PDA   CORONARY ANGIOPLASTY WITH STENT PLACEMENT  10-11-2005  dr [name]   PCI and stenting to midLAD & dRCA (Cypher DES x2)   CORONARY BALLOON ANGIOPLASTY N/A 04/14/2018   Procedure: CORONARY BALLOON ANGIOPLASTY;  Surgeon: Lennette Bihari, MD;  Location: [facility] INVASIVE CV LAB;  Service: Cardiovascular;  Laterality: N/A;   CORONARY STENT INTERVENTION N/A 04/14/2018   Procedure: CORONARY STENT INTERVENTION;  Surgeon: Lennette Bihari, MD;  Location: [facility] INVASIVE CV LAB;  Service: Cardiovascular;  Laterality: N/A;   CYSTOSCOPY W/ URETERAL STENT PLACEMENT Left 02-11-2010     [facility]   LEFT HEART CATH N/A 04/14/2018   Procedure: Left Heart Cath;  Surgeon: Lennette Bihari, MD;  [facility] INVASIVE CV LAB;  Service: Cardiovascular;  Laterality: N/A;   LEFT HEART CATH  AND CORONARY ANGIOGRAPHY N/A 04/12/2018   Procedure: LEFT HEART CATH AND CORONARY ANGIOGRAPHY;  Surgeon: Corky Crafts, MD;  Location: [facility] INVASIVE CV LAB;  Service: Cardiovascular;  Laterality: N/A;   LUMBAR LAMINECTOMY  2015   L5 -- S1   PILONIDAL CYST EXCISION  1980s   PILONIDAL CYST EXCISION N/A 03/31/2017   Procedure: EXCISION OF PILONIDAL DISEASE with Flap Rotation;  Surgeon: Romie Levee, MD;  Location: [facility];  Service: General;  Laterality: N/A;   RIGHT/LEFT HEART CATH AND CORONARY ANGIOGRAPHY N/A 10/18/2022   Procedure: RIGHT/LEFT HEART CATH AND CORONARY ANGIOGRAPHY;  Surgeon: Lennette Bihari, MD;  Location: [facility] INVASIVE CV LAB;  Service: Cardiovascular;  Laterality: N/A;   THROAT SURGERY  1996   per pt "removal piece of cryloid(?) that was pressing against throat"  states no issues since    TRANSTHORACIC ECHOCARDIOGRAM  08-24-2010  dr Tresa Endo   ef 50-55%/  mild MR/  trivial TR   WOUND DEBRIDEMENT N/A 10/06/2017   Procedure: DEBRIDEMENT WOUND PLACEMENT OF WOUND HEALING MATRIX;  Surgeon: Romie Levee, MD;  Location: [facility];  Service: General;  Laterality: N/A;    Assessment & Plan Clinical Impression: Patient is a 77 year old male with a past medical history of interstitial lung disease dating back to at least 2016 who presented to the emergency department at [facility] on 11/26/2022 with recurrent syncope and worsening dyspnea on exertion. The patient reported that he did not check his oxygen frequently at home and was not wearing his oxygen as prescribed or recommended. As part of his workup he underwent CTA of head and neck with evidence of incidental complete right ICA occlusion. CTA with PE protocol was negative for PE but did demonstrate severe IPF and honeycombing, interlobar septal thickening and diffuse central groundglass opacities with bilateral pleural effusions. Felt to be concerning for pulmonary edema and cardiogenic volume overload. He was transferred from [facility] to [facility] for further evaluation. MRI brain non-acute. Pulmonology service was consulted. BNP was 2,446. Aggressive diuresis of at least 1 L daily was recommended with close monitoring of blood pressure and kidney function. O2 saturation goal of 88%'s weaning as tolerated. He was started on IV Solu-Medrol for 3 days and will continue with prednisone taper. TTE was updated. EF ~55-60%. He is tolerating a heart healthy diet. BUN today elevated at 38 creatinine normal at 1.15. Urine potassium and magnesium have been normal. BNP 1098. Cytosis continues to improve. Mild thrombocytopenia. Oxygen saturation levels have been normal on 3 L oxygen via nasal cannula. The patient requires inpatient medicine and rehabilitation evaluations and services for ongoing dysfunction secondary to acute on chronic  hypoxemic respiratory failure. Patient transferred to CIR on 11/30/2022 .   Patient currently requires min with mobility secondary to muscle weakness and decreased cardiorespiratoy endurance and decreased oxygen support.  Prior to hospitalization, patient was modified independent  with mobility and lived with Alone in a House home.  Home access is 1 step >12" tallStairs to enter.  Patient will benefit from skilled PT intervention to maximize safe functional mobility, minimize fall risk, and decrease caregiver burden for planned discharge home with 24 hour supervision.  Anticipate patient will benefit from follow up [facility] at discharge and consideration for cardiopulmonary rehab at outpatient level once [facility] PT complete.  PT - End of Session Activity Tolerance: Tolerates 30+ min activity without fatigue Endurance Deficit: Yes Endurance Deficit Description: continuous O2 compared to PRN prior to admission PT Assessment Rehab Potential (  ACUTE/IP ONLY): Good PT Barriers to Discharge: Lack of/limited family support PT Barriers to Discharge Comments: O2 dependency PT Patient demonstrates impairments in the following area(s): Balance;Edema;Endurance;Pain;Safety PT Transfers Functional Problem(s): Bed Mobility;Bed to Chair;Car;Furniture PT Locomotion Functional Problem(s): Ambulation;Stairs PT Plan PT Intensity: Minimum of 1-2 x/day ,45 to 90 minutes PT Frequency: 5 out of 7 days PT Duration Estimated Length of Stay: 7-10 days PT Treatment/Interventions: Ambulation/gait training;Balance/vestibular training;Cognitive remediation/compensation;Discharge planning;Disease management/prevention;DME/adaptive equipment instruction;Functional mobility training;Pain management;Patient/family education;Stair training;Therapeutic Activities;Therapeutic Exercise;UE/LE Strength taining/ROM;UE/LE Coordination activities PT Transfers Anticipated Outcome(s): ModI PT Locomotion Anticipated Outcome(s): ModI PT  Recommendation Follow Up Recommendations: Home health PT;Other (comment) (long-term plan to consider cardiopulmonary rehab at  OP level) Patient destination: Home Equipment Recommended: To be determined Equipment Details: has standard walker, scooter and SPC   PT Evaluation Precautions/Restrictions Precautions Precautions: Fall Precaution Comments: watch O2 and BP Restrictions Weight Bearing Restrictions: No General Chart Reviewed: Yes Family/Caregiver Present: No Vital SignsTherapy Vitals Pulse Rate: 66 Pain Pain Assessment Pain Scale: 0-10 Pain Score: 6  Pain Type: Acute pain Pain Location: Knee Pain Orientation: Right Pain Descriptors / Indicators: Aching Pain Onset: With Activity Patients Stated Pain Goal: 0 Pain Intervention(s): Ambulation/increased activity Multiple Pain Sites: Yes 2nd Pain Site Pain Score: 6 Pain Type: Acute pain Pain Location: Back Pain Orientation: Upper Pain Descriptors / Indicators: Sore Pain Onset: Gradual Pain Intervention(s): Ambulation/increased activity Pain Interference Pain Interference Pain Effect on Sleep: 1. Rarely or not at all Pain Interference with Therapy Activities: 2. Occasionally Pain Interference with Day-to-Day Activities: 2. Occasionally Home Living/Prior Functioning Home Living Available Help at Discharge: Family;Friend(s);Available 24 hours/day Type of Home: House Home Access: Stairs to enter [employer] of Steps: 1 step >12" tall Entrance Stairs-Rails: None Home Layout: One level Bathroom Shower/Tub: Health visitor: Handicapped height Bathroom Accessibility: Yes  Lives With: Alone Prior Function Level of Independence: Independent with gait (used walker (no wheels) or SPC; kept O2 around neck when walking)  Able to Take Stairs?: Yes (with rails) Vision/Perception  Vision - History Ability to See in Adequate Light: 0 Adequate  Cognition Overall Cognitive Status: Within Functional  Limits for tasks assessed Arousal/Alertness: Awake/alert Orientation Level: Oriented X4 Sensation Sensation Light Touch: Impaired by gross assessment Additional Comments: BLE neuropathy Motor  Motor Motor: Within Functional Limits;Other (comment) Motor - Skilled Clinical Observations: slow 2/2 debility   Trunk/Postural Assessment  Cervical Assessment Cervical Assessment: Within Functional Limits Thoracic Assessment Thoracic Assessment: Within Functional Limits Lumbar Assessment Lumbar Assessment: Within Functional Limits Postural Control Postural Control: Within Functional Limits  Balance Balance Balance Assessed: Yes Dynamic Sitting Balance Dynamic Sitting - Balance Support: No upper extremity supported;Feet supported Dynamic Sitting - Level of Assistance: 5: Stand by assistance Dynamic Sitting - Balance Activities: Reaching for objects Dynamic Standing Balance Dynamic Standing - Balance Support: Bilateral upper extremity supported Dynamic Standing - Level of Assistance: 4: Min assist Dynamic Standing - Balance Activities: Reaching for objects Extremity Assessment      RLE Assessment RLE Assessment: Exceptions to [facility] General Strength Comments: gross 3+/5 limited by pain LLE Assessment LLE Assessment: Within Functional Limits  Care Tool Care Tool Bed Mobility Roll left and right activity   Roll left and right assist level: Supervision/Verbal cueing    Sit to lying activity   Sit to lying assist level: Supervision/Verbal cueing    Lying to sitting on side of bed activity   Lying to sitting on side of bed assist level: the ability to move from lying on the back to  sitting on the side of the bed with no back support.: Supervision/Verbal cueing     Care Tool Transfers Sit to stand transfer   Sit to stand assist level: Minimal Assistance - Patient > 75%    Chair/bed transfer   Chair/bed transfer assist level: Minimal Assistance - Patient > 75%     Toilet  transfer   Assist Level: Minimal Assistance - Patient > 75%    Car transfer   Car transfer assist level: Minimal Assistance - Patient > 75%      Care Tool Locomotion Ambulation   Assist level: Minimal Assistance - Patient > 75% Assistive device: Walker-standard Max distance: 50  Walk 10 feet activity   Assist level: Minimal Assistance - Patient > 75% Assistive device: Walker-standard   Walk 50 feet with 2 turns activity   Assist level: Minimal Assistance - Patient > 75% Assistive device: Walker-standard  Walk 150 feet activity Walk 150 feet activity did not occur: Safety/medical concerns (2/2 pain)      Walk 10 feet on uneven surfaces activity   Assist level: Minimal Assistance - Patient > 75% Assistive device: Walker-standard  Stairs   Assist level: Minimal Assistance - Patient > 75% Stairs assistive device: 2 hand rails Max number of stairs: 1  Walk up/down 1 step activity   Walk up/down 1 step (curb) assist level: Minimal Assistance - Patient > 75% Walk up/down 1 step or curb assistive device: 2 hand rails  Walk up/down 4 steps activity Walk up/down 4 steps activity did not occur: Safety/medical concerns (2/2 pain)      Walk up/down 12 steps activity Walk up/down 12 steps activity did not occur: Safety/medical concerns (2/2 pain)      Pick up small objects from floor Pick up small object from the floor (from standing position) activity did not occur: Safety/medical concerns (2/2 pain)      Wheelchair Is the patient using a wheelchair?: Yes Type of Wheelchair: Manual Wheelchair activity did not occur: Safety/medical concerns (2/2 pain)      Wheel 50 feet with 2 turns activity Wheelchair 50 feet with 2 turns activity did not occur: Safety/medical concerns (2/2 pain)    Wheel 150 feet activity Wheelchair 150 feet activity did not occur: Safety/medical concerns (2/2 pain)      Refer to Care Plan for Long Term Goals  SHORT TERM GOAL WEEK 1 PT Short Term Goal 1  (Week 1): STG = LTG 2/2 LOS  Recommendations for other services: None   Skilled Therapeutic Intervention Mobility Bed Mobility Bed Mobility: Rolling Right;Right Sidelying to Sit Rolling Right: Supervision/verbal cueing Right Sidelying to Sit: Supervision/Verbal cueing Transfers Transfers: Sit to Stand;Stand to Sit;Transfer;Stand Pivot Transfers Sit to Stand: Minimal Assistance - Patient > 75% Stand to Sit: Minimal Assistance - Patient > 75% Stand Pivot Transfers: Minimal Assistance - Patient > 75% Stand Pivot Transfer Details: Verbal cues for sequencing;Verbal cues for precautions/safety Transfer (Assistive device): Standard walker Locomotion  Gait Ambulation: Yes Gait Assistance: Minimal Assistance - Patient > 75% Assistive device: Standard walker Gait Assistance Details: Verbal cues for safe use of DME/AE Stairs / Additional Locomotion Stairs: Yes Stairs Assistance: Minimal Assistance - Patient > 75% Stair Management Technique: Two rails Number of Stairs: 1 Ramp: Minimal Assistance - Patient >75% Wheelchair Mobility Wheelchair Mobility: Yes  Session Note: Chart reviewed and pt agreeable to therapy. Pt received semi-reclined in bed with c/o pain as described above. Also of note, pt on 3L O2 via [place]. Session focused on evaluation and initiation  of locomotion training to promote safe home access. Pt initiated session with evaluation as described above. Pt then completed amb of 17ft with MinA + VC for walker safety. Pt also practiced safe navigation of stairs in setting of knee pain including step-to pattern. Session education emphasized role of PT, therapy goals, and early d/c planning. At end of session, pt was left seated in [facility] with alarm engaged, nurse call bell and all needs in reach.    Discharge Criteria: Patient will be discharged from PT if patient refuses treatment 3 consecutive times without medical reason, if treatment goals not met, if there is a change in medical  status, if patient makes no progress towards goals or if patient is discharged from hospital.  The above assessment, treatment plan, treatment alternatives and goals were discussed and mutually agreed upon: by patient  Dionne Milo 12/01/2022, 11:14 AM

## 2022-12-01 NOTE — Progress Notes (Signed)
Inpatient Rehabilitation  Patient information reviewed and entered into eRehab system by Jalicia Roszak M. Epsie Walthall, M.A., CCC/SLP, PPS Coordinator.  Information including medical coding, functional ability and quality indicators will be reviewed and updated through discharge.    

## 2022-12-01 NOTE — Progress Notes (Signed)
Inpatient Rehabilitation Center Individual Statement of Services  Patient Name:  Maurice Munoz  Date:  12/01/2022  Welcome to the Inpatient Rehabilitation Center.  Our goal is to provide you with an individualized program based on your diagnosis and situation, designed to meet your specific needs.  With this comprehensive rehabilitation program, you will be expected to participate in at least 3 hours of rehabilitation therapies Monday-Friday, with modified therapy programming on the weekends.  Your rehabilitation program will include the following services:  Physical Therapy (PT), Occupational Therapy (OT), Speech Therapy (ST), 24 hour per day rehabilitation nursing, Therapeutic Recreaction (TR), Neuropsychology, Care Coordinator, Rehabilitation Medicine, Nutrition Services, Pharmacy Services, and Other  Weekly team conferences will be held on Wednesdays to discuss your progress.  Your Inpatient Rehabilitation Care Coordinator will talk with you frequently to get your input and to update you on team discussions.  Team conferences with you and your family in attendance may also be held.  Expected length of stay: 10-12 Days  Overall anticipated outcome:  MOD I  Depending on your progress and recovery, your program may change. Your Inpatient Rehabilitation Care Coordinator will coordinate services and will keep you informed of any changes. Your Inpatient Rehabilitation Care Coordinator's name and contact numbers are listed  below.  The following services may also be recommended but are not provided by the Inpatient Rehabilitation Center:   Home Health Rehabiltiation Services Outpatient Rehabilitation Services    Arrangements will be made to provide these services after discharge if needed.  Arrangements include referral to agencies that provide these services.  Your insurance has been verified to be:   Medicare A 7& B Your primary doctor is:  [name]  Pertinent information will be  shared with your doctor and your insurance company.  Inpatient Rehabilitation Care Coordinator:  Lavera Guise, [place] [phone] or [phone]  Information discussed with and copy given to patient by: Andria Rhein, 12/01/2022, 9:37 AM

## 2022-12-01 NOTE — Progress Notes (Signed)
PROGRESS NOTE   Subjective/Complaints: C/o knee pain: tylenol scheduled Discussed lab results Encouraged low carb and high protein diet given hyperglycemia and hypoalbuminemia  ROS: +knee pain  Objective:   No results found. Recent Labs    11/30/22 0404 12/01/22 0548  WBC 12.1* 11.6*  HGB 13.2 13.8  HCT 40.7 42.6  PLT 148* 158   Recent Labs    11/30/22 0404 12/01/22 0548  NA 137 138  K 4.3 4.3  CL 99 97*  CO2 32 32  GLUCOSE 113* 105*  BUN 38* 41*  CREATININE 1.15 1.21  CALCIUM 8.7* 8.9    Intake/Output Summary (Last 24 hours) at 12/01/2022 1140 Last data filed at 12/01/2022 0738 Gross per 24 hour  Intake 476 ml  Output 575 ml  Net -99 ml        Physical Exam: Vital Signs Blood pressure (!) 148/78, pulse 66, temperature 97.8 F (36.6 C), temperature source Oral, resp. rate 17, height 6\' 1"  (1.854 m), weight 81 kg, SpO2 100 %. Constitutional:      General: He is not in acute distress.    Comments: Multiple bandages  HENT:     Head:     Comments: Abrasions on forehead, nose    Right Ear: External ear normal.     Left Ear: External ear normal.  Eyes:     Extraocular Movements: Extraocular movements intact.     Conjunctiva/sclera: Conjunctivae normal.     Pupils: Pupils are equal, round, and reactive to light.  Cardiovascular:     Rate and Rhythm: Normal rate and regular rhythm.     Heart sounds: No murmur heard. Pulmonary:     Effort: Pulmonary effort is normal. No respiratory distress.     Breath sounds: No wheezing or rales.     Comments: O2 Bells Abdominal:     General: There is no distension.     Palpations: Abdomen is soft.     Tenderness: There is no abdominal tenderness.  Musculoskeletal:     Cervical back: No rigidity or tenderness.     Right lower leg: Edema present.     Left lower leg: Edema present.     Comments: Pt demonstrated pain in the right patella and patellar tendon with  palpation and with bending near 90 degrees.   Skin:    Comments: Head and nose wounds as above. Large wound at right wrist with dressing saturated with old blood. Smaller left wrist wound. Superficial quarter sized wounds on each inferior patella area. Bruising left lateral foot.   Neurological:     Comments: Alert and oriented x 3. Normal insight and awareness. Intact Memory. Normal language and speech. Cranial nerve exam unremarkable. MMT: 4/5 BUE with limitations distally due to dressings/wrist wounds. BLE 3+/5 HF, KE, 4/5 ADF/PF. No gross sensory deficits appreciated. Normal tone.   Psychiatric:   Bright and positive.    Assessment/Plan: 1. Functional deficits which require 3+ hours per day of interdisciplinary therapy in a comprehensive inpatient rehab setting. Physiatrist is providing close team supervision and 24 hour management of active medical problems listed below. Physiatrist and rehab team continue to assess barriers to discharge/monitor patient progress toward functional and  medical goals  Care Tool:  Bathing              Bathing assist       Upper Body Dressing/Undressing Upper body dressing        Upper body assist      Lower Body Dressing/Undressing Lower body dressing            Lower body assist       Toileting Toileting    Toileting assist       Transfers Chair/bed transfer  Transfers assist     Chair/bed transfer assist level: Minimal Assistance - Patient > 75%     Locomotion Ambulation   Ambulation assist      Assist level: Minimal Assistance - Patient > 75% Assistive device: Walker-standard Max distance: 50   Walk 10 feet activity   Assist     Assist level: Minimal Assistance - Patient > 75% Assistive device: Walker-standard   Walk 50 feet activity   Assist    Assist level: Minimal Assistance - Patient > 75% Assistive device: Walker-standard    Walk 150 feet activity   Assist Walk 150 feet activity did not  occur: Safety/medical concerns (2/2 pain)         Walk 10 feet on uneven surface  activity   Assist     Assist level: Minimal Assistance - Patient > 75% Assistive device: Development worker, international aid Is the patient using a wheelchair?: Yes Type of Wheelchair: Manual Wheelchair activity did not occur: Safety/medical concerns (2/2 pain)         Wheelchair 50 feet with 2 turns activity    Assist    Wheelchair 50 feet with 2 turns activity did not occur: Safety/medical concerns (2/2 pain)       Wheelchair 150 feet activity     Assist  Wheelchair 150 feet activity did not occur: Safety/medical concerns (2/2 pain)       Blood pressure (!) 148/78, pulse 66, temperature 97.8 F (36.6 C), temperature source Oral, resp. rate 17, height 6\' 1"  (1.854 m), weight 81 kg, SpO2 100 %.  Medical Problem List and Plan: 1. Functional deficits secondary to significant disability related to acute respiratory failure.              -patient may shower (cover right wrist )             -ELOS/Goals: 10-12 days with mod I goals   2.  Antithrombotics: -DVT/anticoagulation:  Pharmaceutical: Heparin             -antiplatelet therapy: Aspirin 81 mg daily, Plavix   3. Right knee pain: Tylenol scheduled. XR ordered             -Zanaflex 4 mg q HS             -right knee pain after recent fall. May have contused patella or strained patella tendon -pt reports pain is beginning to slightly improve -will check knee XR to rule out fx -may use ice as needed 4. Mood/Behavior/Sleep: LCSW to evaluate and provide emotional support             -antipsychotic agents: n/a   5. Neuropsych/cognition: This patient is capable of making decisions on his own behalf.   6. Skin/Wound Care: Routine skin care checks             -monitor multiple skin tears             -  local wound care to each area.              -pad contact areas as needed for protection   7.  Fluids/Electrolytes/Nutrition: Strict Is and Os and follow-up chemistries             -continue vitamin D, B12             -Heart Healthy strict 1.5 L fluid restriction per day    8: Hypertension: monitor TID and prn (?? home amlodipine and lisinopril held)             -continue Imdur 30 mh q AM (?? home 60 mg AM, 30 mg PM)             -continue Lopressor 25 mg BID (?? 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 I  messaged rehab pharmacist with regards to prednisone orders. (?? home prednisone 20 mg q AM) -continue pirfenidone -will need to establish new baseline oxygen requirement at rest and with exertion. Observe with therapy.    12: Pulmonary hypertension/right heart failure: (?? home torsemide held)             -daily weights             -currently no diuretics (states torsemide gives him hand cramps)   13: AKI atop CKD stage IIIb: current Cr 1.15             -follow-up BMP   14: CAD: continue aspirin, Plavix, statin             -follows with Dr. Tresa Endo   15: Carotid stenosis: follow-up outpatient  16. Hypoalbuminemia: encouraged high protein diet  17. Hyperglycemia: discussed elevated blood sugars and recommended avoiding added sugars in diet  >50 minutes spent in review of chart and labs, examination of patient, team conference, discussion of low albumin levels and encouraging a high protein diet, discussed elevated blood sugars and recommended avoiding added sugars in diet, medications reviewed, scheduled tylenol for right knee pain and XR ordered      LOS: 1 days A FACE TO FACE EVALUATION WAS PERFORMED  Zhavia Cunanan P  Lliam Hoh 12/01/2022, 11:40 AM

## 2022-12-01 NOTE — Progress Notes (Signed)
Patient ID: Maurice Munoz, male   DOB: 15-Dec-1945, 77 y.o.   MRN: [id number]  Team Conference Report to Patient/Family  Team Conference discussion was reviewed with the patient and caregiver, including goals, any changes in plan of care and target discharge date.  Patient and caregiver express understanding and are in agreement.  The patient has a target discharge date of  (TBD).  SW met with patient, introduced self and explained role. Patient plans to discharge home with assistance from his granddaughter and his brother. Patient lives in a 1 level home,1 step up to enter. SW will continue to FU with pt and family on progress and tentative discharge date.   Andria Rhein 12/01/2022, 1:19 PM

## 2022-12-02 ENCOUNTER — Inpatient Hospital Stay (HOSPITAL_COMMUNITY): Payer: Medicare Other

## 2022-12-02 DIAGNOSIS — R5381 Other malaise: Secondary | ICD-10-CM | POA: Diagnosis not present

## 2022-12-02 MED ORDER — FUROSEMIDE 20 MG PO TABS
10.0000 mg | ORAL_TABLET | Freq: Every day | ORAL | Status: DC
  Administered 2022-12-02 – 2022-12-03 (×2): 10 mg via ORAL
  Filled 2022-12-02 (×2): qty 1

## 2022-12-02 MED ORDER — TRAMADOL HCL 50 MG PO TABS
50.0000 mg | ORAL_TABLET | Freq: Two times a day (BID) | ORAL | Status: DC | PRN
  Administered 2022-12-02 – 2022-12-03 (×2): 50 mg via ORAL
  Filled 2022-12-02 (×2): qty 1

## 2022-12-02 MED ORDER — HYDROCODONE-ACETAMINOPHEN 5-325 MG PO TABS
1.0000 | ORAL_TABLET | Freq: Every day | ORAL | Status: DC
  Administered 2022-12-02: 1 via ORAL
  Filled 2022-12-02: qty 1

## 2022-12-02 NOTE — Progress Notes (Signed)
Physical Therapy Session Note  Patient Details  Name: Maurice Munoz MRN: [id number] Date of Birth: [birth year]  Today's Date: 12/02/2022 PT Individual Time: 4098-1191 PT Individual Time Calculation (min): 69 min   Short Term Goals: Week 1:  PT Short Term Goal 1 (Week 1): STG = LTG 2/2 LOS  Skilled Therapeutic Interventions/Progress Updates: Pt presented in bed agreeable to therapy. Pt c/o bilateral knee pain 6/10 which increased to 8/10 with mobility. RN notified but no additional pain meds avail at this time, rest breaks provided during session for pain management. Performed bed mobility with supervision, increased time and use of bed features. Pt sat EOB and used urinal with distant supervision. Pt donned shirt and shoes with set up assist. Pt then stood and ambulated ~35ft with RW and CGA, upon standing pt with significantly increased pain limiting ambulation to start. Pt then transports remaining distance to day room. PTA then introduced RW to pt and he was amicable to try. Pt stood with minA due to B knee pain and ambulated ~68ft with SW then transitioned to RW for additional 10ft. Pt demonstrated good safety with use of RW and indicated would be willing to try more. Participated in seated UE therex for general conditioning and to allow pt to take rest on BLE. Performed bicep curls 2lb dowel 2 x 20, chest press with ball taps 2lb dowel 2 x 20, rows with red theraband and 2lb dowel 2 x 20, D1 PNF red theraband x 20 each extremity. Pt then initiated ambulation with RW with intention of ambulating to NuStep. Pt stood with minA and ambulated CGA with RW ~47ft as passing bathroom in dayroom pt indicated sudden urgent need for bathroom (BM). Pt expressed may not be able to reach w/c before becoming incontinent. Pt therefore ambulated into bathroom and was able to manage belt and shorts with minA. Pt requiring minA to descend to toilet but was able to have continent BM. Pt required modA to stand from  lowered toilet with use of RW and wall rail. Pt was total A for peri-care and minA for clothing management. Pt then ambulated to w/c which was moved to outside bathroom. Pt transported back to room due to time management and performed ambulatory transfer to bed with minA to stand once in room. Pt was able to transfer back to supine with supervision and use of bed rail. Pt left in bed at end of session with bed alarm on, call bell within reach and needs met.      Therapy Documentation Precautions:  Precautions Precautions: Fall Precaution Comments: watch O2 and BP Restrictions Weight Bearing Restrictions: No  Therapy/Group: Individual Therapy  Keita Demarco 12/02/2022, 12:24 PM

## 2022-12-02 NOTE — Progress Notes (Signed)
Occupational Therapy Session Note  Patient Details  Name: Maurice Munoz MRN: [id number] Date of Birth: 13-Dec-1945  Today's Date: 12/02/2022 OT Missed Time: 60 Minutes Missed Time Reason: Patient fatigue;Patient unwilling/refused to participate without medical reason;Pain   Short Term Goals: Week 1:  OT Short Term Goal 1 (Week 1): STG=LTG's d/t LOS  Skilled Therapeutic Interventions/Progress Updates:     Attempted to see this Pt for dance-based group therapy session. Pt reporting fatigue and pain in knee politely refusing to participate in session. Provided maximal therapeutic support and motivation and options for modifications of tasks, however no improvement or willingness to participate. Medical team aware and addressing pain. Missed 60 minutes of skilled OT treatment d/t fatigue/pain. Will attempt to make up time as schedule and Pt's status allows.   Therapy Documentation Precautions:  Precautions Precautions: Fall Precaution Comments: watch O2 and BP Restrictions Weight Bearing Restrictions: No   Therapy/Group: Individual Therapy  Army Fossa 12/02/2022, 5:56 PM

## 2022-12-02 NOTE — Progress Notes (Signed)
Physical Therapy Session Note  Patient Details  Name: Maurice Munoz MRN: [id number] Date of Birth: [birth year]  Today's Date: 12/02/2022 PT Individual Time: 1330-1415 PT Individual Time Calculation (min): 45 min   Short Term Goals: Week 1:  PT Short Term Goal 1 (Week 1): STG = LTG 2/2 LOS  Skilled Therapeutic Interventions/Progress Updates:  Patient greeted supine in bed with daughter present and agreeable to PT treatment session. Patient reporting increased bottom and Rt knee pain upon arrival with B feet noted to have pitting edema. Patient was able to roll Rt with bed rail and CGA in order for therapist to further inspect bottom area. Patient reported he had a cyst removed but it bothers him when he lies on it for too long. Upon inspection, patient noted to have a wound on his sacrum- RN, Ladona Ridgel, notified and aware. Therapist donned mepilex pad for cushioning and increased comfort. Therapist also retrieved ted hose and donned to BLE in order to improve edema. Therapist placed three pillows under each LE in order to provide elevation, as well as raising the foot of the bed. While therapist was retrieving supplies, patient performed 3 x 15 glute squeezes while supine in the bed. Patient left positioned to comfort with bed alarm on, call bell within reach and all needs met.    Therapy Documentation Precautions:  Precautions Precautions: Fall Precaution Comments: watch O2 and BP Restrictions Weight Bearing Restrictions: No  Pain: 8/10 pain, mostly in Rt knee and not due for pain medication at this time.    Therapy/Group: Individual Therapy  Christpher Stogsdill 12/02/2022, 8:01 AM

## 2022-12-02 NOTE — Progress Notes (Signed)
Occupational Therapy Session Note  Patient Details  Name: Maurice Munoz MRN: [id number] Date of Birth: 15-Feb-1946  Today's Date: 12/02/2022 OT Individual Time: 1005-1045 OT Individual Time Calculation (min): 40 min    Short Term Goals: Week 1:  OT Short Term Goal 1 (Week 1): STG=LTG's d/t LOS  Skilled Therapeutic Interventions/Progress Updates:    Patient agreeable to participate in OT session. Reports 8/10 pain in bilateral knees (abrasion areas). Pain meds requested during session and given by nursing.   Patient participated in skilled OT session focusing on pain management techniques, manual therapy, joint mobility, and patient education. Myofascial release completed to bilateral knees with knee mobilizations to decrease fascial restrictions and increase joint mobility in a pain free zone. Therapist educated pt on pain management techniques such as use of ice packs, pain medications, and ROM exercises to utilize as needed. Educated patient to perform LE exercises such as ankle pumps and heel slides to assist with decreasing edema in bilateral feet. Pt provided with ice packs and extra pillow for additional elevation at end of session.   - P/ROM, BLE, knee flexion/extension, 10X, 1 set, supine. ROM improvement noted.        Therapy Documentation Precautions:  Precautions Precautions: Fall Precaution Comments: watch O2 and BP Restrictions Weight Bearing Restrictions: No   Therapy/Group: Individual Therapy  Limmie Patricia, OTR/L,CBIS  Supplemental OT - [facility] Secure Chat Preferred   12/02/2022, 9:10 AM

## 2022-12-02 NOTE — Progress Notes (Signed)
PROGRESS NOTE   Subjective/Complaints: Having a lot of knee pain, XR ordered, tramadol ordered q12h prn, and hydrocodone scheduled daily Lasix ordered for swelling  ROS: +knee pain, +lower extremity swelling  Objective:   No results found. Recent Labs    11/30/22 0404 12/01/22 0548  WBC 12.1* 11.6*  HGB 13.2 13.8  HCT 40.7 42.6  PLT 148* 158   Recent Labs    11/30/22 0404 12/01/22 0548  NA 137 138  K 4.3 4.3  CL 99 97*  CO2 32 32  GLUCOSE 113* 105*  BUN 38* 41*  CREATININE 1.15 1.21  CALCIUM 8.7* 8.9    Intake/Output Summary (Last 24 hours) at 12/02/2022 1007 Last data filed at 12/02/2022 0800 Gross per 24 hour  Intake 716 ml  Output 1450 ml  Net -734 ml        Physical Exam: Vital Signs Blood pressure 123/66, pulse 64, temperature 98 F (36.7 C), temperature source Oral, resp. rate 17, height 6\' 1"  (1.854 m), weight 82.4 kg, SpO2 97 %. Constitutional:      General: He is not in acute distress.    Comments: Multiple bandages  HENT:     Head:     Comments: Abrasions on forehead, nose    Right Ear: External ear normal.     Left Ear: External ear normal.  Eyes:     Extraocular Movements: Extraocular movements intact.     Conjunctiva/sclera: Conjunctivae normal.     Pupils: Pupils are equal, round, and reactive to light.  Cardiovascular:     Rate and Rhythm: Normal rate and regular rhythm.     Heart sounds: No murmur heard. Pulmonary:     Effort: Pulmonary effort is normal. No respiratory distress.     Breath sounds: No wheezing or rales.     Comments: O2 Taylor- satting in normal ranges Abdominal:     General: There is no distension.     Palpations: Abdomen is soft.     Tenderness: There is no abdominal tenderness.  Musculoskeletal:     Cervical back: No rigidity or tenderness.     Right lower leg: Edema present.     Left lower leg: Edema present.     Comments: Pt demonstrated pain in the right  patella and patellar tendon with palpation and with bending near 90 degrees.   Skin:    Comments: Head and nose wounds as above. Large wound at right wrist with dressing saturated with old blood. Smaller left wrist wound. Superficial quarter sized wounds on each inferior patella area. Bruising left lateral foot.   Neurological:     Comments: Alert and oriented x 3. Normal insight and awareness. Intact Memory. Normal language and speech. Cranial nerve exam unremarkable. MMT: 4/5 BUE with limitations distally due to dressings/wrist wounds. BLE 3+/5 HF, KE, 4/5 ADF/PF. No gross sensory deficits appreciated. Normal tone.   Psychiatric:   Bright and positive.    Assessment/Plan: 1. Functional deficits which require 3+ hours per day of interdisciplinary therapy in a comprehensive inpatient rehab setting. Physiatrist is providing close team supervision and 24 hour management of active medical problems listed below. Physiatrist and rehab team continue to assess barriers  to discharge/monitor patient progress toward functional and medical goals  Care Tool:  Bathing    Body parts bathed by patient: Right arm, Left arm, Chest, Abdomen, Front perineal area, Buttocks, Right upper leg, Left upper leg   Body parts bathed by helper: Right lower leg, Left lower leg     Bathing assist Assist Level: Maximal Assistance - Patient 24 - 49%     Upper Body Dressing/Undressing Upper body dressing   What is the patient wearing?: Button up shirt    Upper body assist Assist Level: Moderate Assistance - Patient 50 - 74%    Lower Body Dressing/Undressing Lower body dressing      What is the patient wearing?: Underwear/pull up, Pants     Lower body assist Assist for lower body dressing: Maximal Assistance - Patient 25 - 49%     Toileting Toileting    Toileting assist Assist for toileting: Moderate Assistance - Patient 50 - 74%     Transfers Chair/bed transfer  Transfers assist     Chair/bed  transfer assist level: Minimal Assistance - Patient > 75%     Locomotion Ambulation   Ambulation assist      Assist level: Minimal Assistance - Patient > 75% Assistive device: Walker-standard Max distance: 50   Walk 10 feet activity   Assist     Assist level: Minimal Assistance - Patient > 75% Assistive device: Walker-standard   Walk 50 feet activity   Assist    Assist level: Minimal Assistance - Patient > 75% Assistive device: Walker-standard    Walk 150 feet activity   Assist Walk 150 feet activity did not occur: Safety/medical concerns (2/2 pain)         Walk 10 feet on uneven surface  activity   Assist     Assist level: Minimal Assistance - Patient > 75% Assistive device: Development worker, international aid Is the patient using a wheelchair?: Yes Type of Wheelchair: Manual Wheelchair activity did not occur: Safety/medical concerns (2/2 pain)         Wheelchair 50 feet with 2 turns activity    Assist    Wheelchair 50 feet with 2 turns activity did not occur: Safety/medical concerns (2/2 pain)       Wheelchair 150 feet activity     Assist  Wheelchair 150 feet activity did not occur: Safety/medical concerns (2/2 pain)       Blood pressure 123/66, pulse 64, temperature 98 F (36.7 C), temperature source Oral, resp. rate 17, height 6\' 1"  (1.854 m), weight 82.4 kg, SpO2 97 %.  Medical Problem List and Plan: 1. Functional deficits secondary to significant disability related to acute respiratory failure.              -patient may shower (cover right wrist )             -ELOS/Goals: 10-12 days with mod I goals   2.  Antithrombotics: -DVT/anticoagulation:  Pharmaceutical: Heparin             -antiplatelet therapy: Aspirin 81 mg daily, Plavix   3. Right knee pain: Tylenol scheduled. XR ordered. Tramadol ordered prn. Norco scheduled             -Zanaflex 4 mg q HS             -right knee pain after recent fall. May have  contused patella or strained patella tendon -pt reports pain is beginning to slightly improve -will check knee XR to  rule out fx -may use ice as needed 4. Mood/Behavior/Sleep: LCSW to evaluate and provide emotional support             -antipsychotic agents: n/a   5. Neuropsych/cognition: This patient is capable of making decisions on his own behalf.   6. Skin/Wound Care: Routine skin care checks             -monitor multiple skin tears             -local wound care to each area.              -pad contact areas as needed for protection   7. Fluids/Electrolytes/Nutrition: Strict Is and Os and follow-up chemistries             -continue vitamin D, B12             -Heart Healthy strict 1.5 L fluid restriction per day    8: Hypertension: monitor TID and prn (?? home amlodipine and lisinopril held)             -continue Imdur 30 mh q AM (?? home 60 mg AM, 30 mg PM)             -continue Lopressor 25 mg BID (?? 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 I  messaged rehab pharmacist with regards to prednisone orders. (?? home prednisone 20 mg q AM) -continue pirfenidone -will need to establish new baseline oxygen requirement at rest and with exertion. Observe with therapy.    12: Pulmonary hypertension/right heart failure: (?? home torsemide held)             -daily weights             -currently no diuretics (states torsemide gives him hand cramps)  Started Lasix 20mg  daily   13: AKI atop CKD stage IIIb: creatinine reviewed, resolved   14: CAD: continue aspirin, Plavix, statin             -follows with Dr. Tresa Endo   15: Carotid stenosis:  follow-up outpatient  16. Hypoalbuminemia: encouraged high protein diet. Monitor CMP weekly.   17. Hyperglycemia: Improved, discussed elevated blood sugars and recommended avoiding added sugars in diet  18. Lower extremity swelling: Lasix 10mg  daily ordered        LOS: 2 days A FACE TO FACE EVALUATION WAS PERFORMED  Hakan Nudelman P Fayola Meckes 12/02/2022, 10:07 AM

## 2022-12-02 NOTE — Progress Notes (Signed)
Recreational Therapy Session Note  Patient Details  Name: Maurice Munoz MRN: [id number] Date of Birth: 1945-12-09 Today's Date: 12/02/2022  Pt missed session today due to c/o knee pain.  Team aware and addressing.  Adelina Collard 12/02/2022, 5:37 PM

## 2022-12-02 NOTE — Patient Care Conference (Addendum)
Inpatient RehabilitationTeam Conference and Plan of Care Update Date: 12/01/2022   Time: 11:40 AM    Patient Name: Maurice Munoz      Medical Record Number: [id number]  Date of Birth: 09-28-1945 Sex: Male         Room/Bed: 4U98J/1B14N-82 Payor Info: Payor: MEDICARE / Plan: MEDICARE PART A AND B / Product Type: *No Product type* /    Admit Date/Time:  11/30/2022  3:57 PM  Primary Diagnosis:  Debility  Hospital Problems: Principal Problem:   Debility Active Problems:   Respiratory failure [facility])    Expected Discharge Date: Expected Discharge Date: 12/11/22  Team Members Present: Physician leading conference: Dr. Sula Soda Social Worker Present: Lavera Guise, BSW Nurse Present: Chana Bode, RN;Bravlio Luca Marjo Bicker, RN PT Present: Raechel Chute, PT OT Present: Candee Furbish, OT     Current Status/Progress Goal Weekly Team Focus  Bowel/Bladder   Continent of b/b   Remain continent   Assist with toileting as needed    Swallow/Nutrition/ Hydration               ADL's   OT eval pending   OT eval pending   OT eval pending    Mobility   bed mobility supervision, transfers with RW min A, gait 76ft with RW min A   Mod I  DME safety, amb endurance, stairs, O2 dependency, transfers    Communication                Safety/Cognition/ Behavioral Observations               Pain   No c/o pain at this time   pain <3/10   Assess Qshift and prn    Skin   Multiple skin tears from top to bottom, all dressed   Promote healing and prevention of infection  Continue wound care as ordered      Discharge Planning:  Patient plans to discharge home with assistance from his granddaughter and his brother. Patient lives in a 1 level home,1 step up to enter   Team Discussion: Debility. Labs look good. WOC seeing for skin tears and wounds to UE/LE. Knee pain managed with scheduled medications.  Addressing O2 needs due to pulmonary fibrosis. Daily weight. Heart  healthy/thin-1200 fluid restriction. OT evaluation pending.  PT therapy going well.  Patient on target to meet rehab goals: yes, will meet goals by discharge date 12/11/22  *See Care Plan and progress notes for long and short-term goals.   Revisions to Treatment Plan:  Schedule Tylenol.  Monitor labs/VS Teaching Needs: Medications, safety, self care, skin care, gait/transfer training, etc.   Current Barriers to Discharge: Decreased caregiver support, Home enviroment access/layout, Wound care, and New oxygen  Possible Resolutions to Barriers: Family education, healing wounds, order recommended DME     Medical Summary Current Status: oxygen use, debility, knee pain, pulmonary fibrosis, hyperglycemia, hypoalbuminemia  Barriers to Discharge: Medical stability  Barriers to Discharge Comments: oxygen use, debility, knee pain, pulmonary fibrosis, hyperglycemia, hypoalbiuminemia Possible Resolutions to [employer] Focus: will need to determined oxygen recommendations from his outpatient pulmonologist, provided dietary education, tylenol scheduled   Continued Need for Acute Rehabilitation Level of Care: The patient requires daily medical management by a physician with specialized training in physical medicine and rehabilitation for the following reasons: Direction of a multidisciplinary physical rehabilitation program to maximize functional independence : Yes Medical management of patient stability for increased activity during participation in an intensive rehabilitation regime.: Yes Analysis of laboratory values and/or  radiology reports with any subsequent need for medication adjustment and/or medical intervention. : Yes   I attest that I was present, lead the team conference, and concur with the assessment and plan of the team.   Jearld Adjutant 12/02/2022, 9:42 AM

## 2022-12-03 DIAGNOSIS — R5381 Other malaise: Secondary | ICD-10-CM | POA: Diagnosis not present

## 2022-12-03 MED ORDER — FUROSEMIDE 20 MG PO TABS
20.0000 mg | ORAL_TABLET | Freq: Every day | ORAL | Status: DC
  Administered 2022-12-04 – 2022-12-10 (×7): 20 mg via ORAL
  Filled 2022-12-03 (×7): qty 1

## 2022-12-03 MED ORDER — PANTOPRAZOLE SODIUM 20 MG PO TBEC
20.0000 mg | DELAYED_RELEASE_TABLET | Freq: Every day | ORAL | Status: DC
  Administered 2022-12-04 – 2022-12-11 (×8): 20 mg via ORAL
  Filled 2022-12-03 (×8): qty 1

## 2022-12-03 MED ORDER — METOPROLOL SUCCINATE ER 25 MG PO TB24
25.0000 mg | ORAL_TABLET | Freq: Every day | ORAL | Status: DC
  Administered 2022-12-03 – 2022-12-10 (×8): 25 mg via ORAL
  Filled 2022-12-03 (×8): qty 1

## 2022-12-03 MED ORDER — HYDROCODONE-ACETAMINOPHEN 5-325 MG PO TABS
1.0000 | ORAL_TABLET | ORAL | Status: DC | PRN
  Administered 2022-12-04: 1 via ORAL
  Filled 2022-12-03: qty 1

## 2022-12-03 MED ORDER — TRAMADOL HCL 50 MG PO TABS
50.0000 mg | ORAL_TABLET | Freq: Two times a day (BID) | ORAL | Status: DC | PRN
  Administered 2022-12-04 – 2022-12-05 (×2): 50 mg via ORAL
  Filled 2022-12-03 (×2): qty 1

## 2022-12-03 NOTE — IPOC Note (Signed)
Overall Plan of Care [facility]) Patient Details Name: Maurice Munoz MRN: [id number] DOB: 05/13/1946  Admitting Diagnosis: Debility  Hospital Problems: Principal Problem:   Debility Active Problems:   Respiratory failure (HCC)     Functional Problem List: Nursing Pain, Safety, Endurance, Edema, Medication Management, Skin Integrity, Nutrition  PT Balance, Edema, Endurance, Pain, Safety  OT Balance, Nutrition, Pain, Edema, Endurance, Sensory, Motor, Skin Integrity  SLP    TR         Basic ADL's: OT Grooming, Bathing, Dressing, Toileting     Advanced  ADL's: OT Simple Meal Preparation, Light Housekeeping     Transfers: PT Bed Mobility, Bed to Chair, Car, Occupational psychologist, Research scientist (life sciences): PT Ambulation, Stairs     Additional Impairments: OT None  SLP        TR      Anticipated Outcomes Item Anticipated Outcome  Self Feeding mod I  Swallowing      Basic self-care  mod I  Toileting  mod I   Bathroom Transfers mod I  Bowel/Bladder  n/a  Transfers  ModI  Locomotion  ModI  Communication     Cognition     Pain  < 4 with prns  Safety/Judgment  manage w cues   Therapy Plan: PT Intensity: Minimum of 1-2 x/day ,45 to 90 minutes PT Frequency: 5 out of 7 days PT Duration Estimated Length of Stay: 7-10 days OT Intensity: Minimum of 1-2 x/day, 45 to 90 minutes OT Frequency: 5 out of 7 days OT Duration/Estimated Length of Stay: 7-10 d     Team Interventions: Nursing Interventions Patient/Family Education, Pain Management, Medication Management, Discharge Planning, Skin Care/Wound Management, Disease Management/Prevention  PT interventions Ambulation/gait training, Balance/vestibular training, Cognitive remediation/compensation, Discharge planning, Disease management/prevention, DME/adaptive equipment instruction, Functional mobility training, Pain management, Patient/family education, Stair training, Therapeutic Activities, Therapeutic  Exercise, UE/LE Strength taining/ROM, UE/LE Coordination activities  OT Interventions Warden/ranger, Community reintegration, Disease mangement/prevention, Neuromuscular re-education, Patient/family education, Self Care/advanced ADL retraining, Splinting/orthotics, Therapeutic Exercise, UE/LE Coordination activities, Wheelchair propulsion/positioning, Discharge planning, DME/adaptive equipment instruction, Functional mobility training, Pain management, Psychosocial support, Skin care/wound managment, Therapeutic Activities, UE/LE Strength taining/ROM  SLP Interventions    TR Interventions    SW/CM Interventions Discharge Planning, Psychosocial Support, Patient/Family Education, Disease Management/Prevention   Barriers to Discharge MD  Medical stability  Nursing Lack of/limited family support, Other (comments) (Home O2 at baseline) 1 level/threshold entry no rails solo, sleeps in a recliner and uses a scooter for community access; grand daughter available prn  PT Lack of/limited family support O2 dependency  OT Inaccessible home environment, Decreased caregiver support, Wound Care wounds, O2, lives alone  SLP      SW Wound Care, Lack of/limited family support, Decreased caregiver support     Team Discharge Planning: Destination: PT-Home ,OT- Home , SLP-  Projected Follow-up: PT-Home health PT, Other (comment) (long-term plan to consider cardiopulmonary rehab at  OP level), OT-  Home health OT, SLP-  Projected Equipment Needs: PT-To be determined, OT- Rolling walker with 5" wheels, SLP-  Equipment Details: PT-has standard walker, scooter and SPC, OT-may benefit from rollator Patient/family involved in discharge planning: PT- Patient,  OT-Patient, SLP-   MD ELOS: 10-12 days Medical Rehab Prognosis:  Excellent Assessment: The patient has been admitted for CIR therapies with the diagnosis of debility. The team will be addressing functional mobility, strength, stamina, balance,  safety, adaptive techniques and equipment, self-care, bowel and bladder mgt, patient and caregiver education.  Goals have been set at modI. Anticipated discharge destination is home.         See Team Conference Notes for weekly updates to the plan of care

## 2022-12-03 NOTE — Progress Notes (Signed)
PROGRESS NOTE   Subjective/Complaints: Metoprolol was held this morning due to hypotension, d/cd and started toprol XL at night Knee pain continues  ROS: +knee pain- improved with hydrocodone, +lower extremity swelling  Objective:   DG Knee 4 Views W/Patella Right  Result Date: 12/02/2022 CLINICAL DATA:  Pain. EXAM: RIGHT KNEE - COMPLETE 4+ VIEW COMPARISON:  Right knee radiographs 02/26/2021 FINDINGS: There is diffuse decreased bone mineralization. Mild-to-moderate medial compartment joint space narrowing is similar to prior. Large chronic degenerative enthesophytes at the superior tibial tubercle greater than the inferior aspect of the patella at the distal insertion and proximal origin of the patellar tendon, respectively. This does not significant changed from prior. Minimal chronic enthesopathic change at the quadriceps insertion on the patella. No acute fracture or dislocation. No joint effusion. Mild-to-moderate atherosclerotic calcifications. IMPRESSION: 1. Mild-to-moderate medial compartment joint space narrowing. 2. Large chronic degenerative enthesophytes at the superior tibial tubercle and inferior aspect of the patella. Electronically Signed   By: Neita Garnet M.D.   On: 12/02/2022 13:05   Recent Labs    12/01/22 0548  WBC 11.6*  HGB 13.8  HCT 42.6  PLT 158   Recent Labs    12/01/22 0548  NA 138  K 4.3  CL 97*  CO2 32  GLUCOSE 105*  BUN 41*  CREATININE 1.21  CALCIUM 8.9    Intake/Output Summary (Last 24 hours) at 12/03/2022 1252 Last data filed at 12/03/2022 1244 Gross per 24 hour  Intake 1200 ml  Output 2025 ml  Net -825 ml        Physical Exam: Vital Signs Blood pressure 97/75, pulse 69, temperature (!) 97.5 F (36.4 C), resp. rate 18, height 6\' 1"  (1.854 m), weight 78.2 kg, SpO2 99 %. Constitutional:      General: He is not in acute distress.    Comments: Multiple bandages  HENT:     Head:      Comments: Abrasions on forehead, nose    Right Ear: External ear normal.     Left Ear: External ear normal.  Eyes:     Extraocular Movements: Extraocular movements intact.     Conjunctiva/sclera: Conjunctivae normal.     Pupils: Pupils are equal, round, and reactive to light.  Cardiovascular:     Rate and Rhythm: Normal rate and regular rhythm.     Heart sounds: No murmur heard. Pulmonary:     Effort: Pulmonary effort is normal. No respiratory distress.     Breath sounds: No wheezing or rales.     Comments: O2 - satting in normal ranges Abdominal:     General: There is no distension.     Palpations: Abdomen is soft.     Tenderness: There is no abdominal tenderness.  Musculoskeletal:     Cervical back: No rigidity or tenderness.     Right lower leg: Edema present.     Left lower leg: Edema present.     Comments: Pt demonstrated pain in the right patella and patellar tendon with palpation and with bending near 90 degrees.   Skin:    Comments: Head and nose wounds as above. Large wound at right wrist with dressing saturated with old  blood. Smaller left wrist wound. Superficial quarter sized wounds on each inferior patella area. Bruising left lateral foot.   Neurological:     Comments: Alert and oriented x 3. Normal insight and awareness. Intact Memory. Normal language and speech. Cranial nerve exam unremarkable. MMT: 4/5 BUE with limitations distally due to dressings/wrist wounds. BLE 3+/5 HF, KE, 4/5 ADF/PF. No gross sensory deficits appreciated. Normal tone.   Psychiatric:   Bright and positive.  Extremities: lower extremity edema improved   Assessment/Plan: 1. Functional deficits which require 3+ hours per day of interdisciplinary therapy in a comprehensive inpatient rehab setting. Physiatrist is providing close team supervision and 24 hour management of active medical problems listed below. Physiatrist and rehab team continue to assess barriers to discharge/monitor patient  progress toward functional and medical goals  Care Tool:  Bathing    Body parts bathed by patient: Right arm, Left arm, Chest, Abdomen, Front perineal area, Buttocks, Right upper leg, Left upper leg   Body parts bathed by helper: Right lower leg, Left lower leg     Bathing assist Assist Level: Maximal Assistance - Patient 24 - 49%     Upper Body Dressing/Undressing Upper body dressing   What is the patient wearing?: Button up shirt    Upper body assist Assist Level: Moderate Assistance - Patient 50 - 74%    Lower Body Dressing/Undressing Lower body dressing      What is the patient wearing?: Underwear/pull up, Pants     Lower body assist Assist for lower body dressing: Maximal Assistance - Patient 25 - 49%     Toileting Toileting    Toileting assist Assist for toileting: Moderate Assistance - Patient 50 - 74%     Transfers Chair/bed transfer  Transfers assist     Chair/bed transfer assist level: Minimal Assistance - Patient > 75%     Locomotion Ambulation   Ambulation assist      Assist level: Minimal Assistance - Patient > 75% Assistive device: Walker-standard Max distance: 50   Walk 10 feet activity   Assist     Assist level: Minimal Assistance - Patient > 75% Assistive device: Walker-standard   Walk 50 feet activity   Assist    Assist level: Minimal Assistance - Patient > 75% Assistive device: Walker-standard    Walk 150 feet activity   Assist Walk 150 feet activity did not occur: Safety/medical concerns (2/2 pain)         Walk 10 feet on uneven surface  activity   Assist     Assist level: Minimal Assistance - Patient > 75% Assistive device: Walker-standard   Wheelchair     Assist Is the patient using a wheelchair?: Yes Type of Wheelchair: Manual Wheelchair activity did not occur: Safety/medical concerns (2/2 pain)         Wheelchair 50 feet with 2 turns activity    Assist    Wheelchair 50 feet with 2  turns activity did not occur: Safety/medical concerns (2/2 pain)       Wheelchair 150 feet activity     Assist  Wheelchair 150 feet activity did not occur: Safety/medical concerns (2/2 pain)       Blood pressure 97/75, pulse 69, temperature (!) 97.5 F (36.4 C), resp. rate 18, height 6\' 1"  (1.854 m), weight 78.2 kg, SpO2 99 %.  Medical Problem List and Plan: 1. Functional deficits secondary to significant disability related to acute respiratory failure.              -  patient may shower (cover right wrist )             -ELOS/Goals: 10-12 days with mod I goals   2.  Antithrombotics: -DVT/anticoagulation:  Pharmaceutical: Heparin             -antiplatelet therapy: Aspirin 81 mg daily, Plavix   3. Right knee pain: Tylenol scheduled. XR ordered. Tramadol ordered prn. Norco increase to q4H prn. XR results show OA and enethesophyts, discussed with patient             -Zanaflex 4 mg q HS             -right knee pain after recent fall. May have contused patella or strained patella tendon -pt reports pain is beginning to slightly improve -will check knee XR to rule out fx -may use ice as needed 4. Mood/Behavior/Sleep: LCSW to evaluate and provide emotional support             -antipsychotic agents: n/a   5. Neuropsych/cognition: This patient is capable of making decisions on his own behalf.   6. Skin/Wound Care: Routine skin care checks             -monitor multiple skin tears             -local wound care to each area.              -pad contact areas as needed for protection   7. Fluids/Electrolytes/Nutrition: Strict Is and Os and follow-up chemistries             -continue vitamin D, B12             -Heart Healthy strict 1.5 L fluid restriction per day    8: Hypertension: monitor TID and prn (?? home amlodipine and lisinopril held)             -continue Imdur 30 mh q AM (?? home 60 mg AM, 30 mg PM)             -continue Lopressor 25 mg BID (?? 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 I  messaged rehab pharmacist with regards to prednisone orders. (?? home prednisone 20 mg q AM) -continue pirfenidone -will need to establish new baseline oxygen requirement at rest and with exertion. Observe with therapy.    12: Pulmonary hypertension/right heart failure: (?? home torsemide held)             -daily weights             -currently no diuretics (states torsemide gives him hand cramps)  Started Lasix 20mg  daily   13: AKI atop CKD stage IIIb: creatinine reviewed, resolved   14: CAD: continue aspirin, Plavix, statin             -follows with Dr. Tresa Endo   15: Carotid stenosis: follow-up outpatient  16. Hypoalbuminemia: encouraged high protein diet. Monitor CMP weekly.   17. Hyperglycemia: Improved, discussed elevated blood sugars and recommended avoiding  added sugars in diet  18. Lower extremity swelling: Increase lasix to 20mg  daily  19. Hypotension: change metoprolol to Toprol XL 25mg  HS        LOS: 3 days A FACE TO FACE EVALUATION WAS PERFORMED  Drema Pry Raivyn Kabler 12/03/2022, 12:52 PM

## 2022-12-03 NOTE — Progress Notes (Addendum)
Physical Therapy Session Note  Patient Details  Name: Maurice Munoz MRN: [id number] Date of Birth: 1946/01/20  Today's Date: 12/03/2022 PT Individual Time: 0803-0904 PT Individual Time Calculation (min): 61 min   PT Individual Time: 4098-1191 PT Individual Time Calculation (min): 75 min   Short Term Goals: Week 1:  PT Short Term Goal 1 (Week 1): STG = LTG 2/2 LOS  Skilled Therapeutic Interventions/Progress Updates:    AM Session: Patient received resting in bed at start of session. Reports 8/10 pain in bil knees, Rt>Lt slightly. Moniroted throughout and requested pain meds to RN via secure chat. Pt agreeable to PT visit. Knee high ted hose donned via total assist while supine in bed. Pt performed bed mobility with supervision, increased time and use of bed features. Pt completed sit> stand with RW today to continue progression of DME that began yesterday; Mod assist required to power up and min assist to guide turn bed>WC. Pt dependently transported to day room gym for therapeutic exercise. Stand pivot transfer with RW completed WC>EOM; cues for hand placement with Rt UE on RW and Lt hand push off of armrest.  Seated UE/LE Exercises: - bicep curls; 2x 15 reps 7lbs dowel - chest press; 2x 15 reps 3lbs dowel - shoulder press;  2x 15 reps 3lbs dowel (pt with slight posterior lean and cues to keep feet on floor needed) - LAQ; 2x 10 reps  no weights.  Pt completed sit>stand>pivot with RW and Mod assist to rise and min assist to guide turn with RW to move EOM to WC. Pt dependently transported back to room for time management and energy conservation. Pt agreeable to remain OOB in WC rather than returning to bed. Alarm on and call bell within reach and all needs met/within reach.   PM Session: Pt resting in bed at start of session and agreeable to PT visit. Supervision for bed mobility with supine>sit OEB, pt using bed features including HOB slightly elevated. Total assist provided to don socks  and max assist to don shoes bill. Pt completed squat pivot transfer bed>WC, cues for safe set up to remove armrest and angle chair, min assist for safety. Pt dependently transported to dayroom gym and sit>stand>pivot to NuStep completed with Mod Assist for power up and CGA for turn to sit on NuStep. Pt performed 8 minutes bil LE only on level 3 for LE strengthening and AROM to LE's. Pt completed total of 338 steps. During stepping pt singing songs he has written and titles them "I'm too Blessed to be Depressed", "Jesus is my Savior Jesus is my Friend". Pt on 2L/min throughout and SpO2 95% after NuStep. Sit>stand>pivot with RW completed from NuStep to [facility] and pt moved into open gym area for gait training. Pt completed gait with RW, 1x~30' with min assist fading to close CGA with WC follow for safety. SpO2 90% immediately after gait and pt recovered to 93% within 1 minute sitting. Pt then dependently transported to hall by main gym for additional bouts: 1x25', 1x35' with RW and min assist to CGA. During all bouts of gait verbal cues for gluteal activation provided and improved posture and quad activation in stance. Pt's trunk and bil knees more flexed as he fatigues.   Seated rest provided and pt dependently transported to main gym and squat pivot transfer WC>EOM performed with CGA. Pt participated in 2 rounds of 4 minutes for beach ball hit with bil UE lifting 2lbs dowel to hit ball back towards therapist. Pt with  noted increased DOE with each bouts, SpO2 99% and HR in 80's. Following rest pt completed seated marching/foot taps onto step to faciliate greater hip flexion and knee flexion. Pt unable to completed with 6" box on Rt LE but able to clear step and tap with Lt LE with no postural compensations. Pt leaning Lt, posteriorly, and abducting Rt LE to attempt clearing 6" box height and box adjusted to 4" step. Pt had improved mechanics and no compensation strategies required to clear height. Squat pivot transfer  completed EOM>WC and pt dependently rolled back to room for time management and energy conservation. Pt agreeable to remain OOB in WC rather than returning to bed. Alarm on and call bell within reach and all needs met/within reach.  Therapy Documentation Precautions:  Precautions Precautions: Fall Precaution Comments: watch O2 and BP Restrictions Weight Bearing Restrictions: No  Pain:  Pt having knee pain bil Rt>Lt during session, RN medicated at start of AM session and following PM session. Pain monitored throughout.  Therapy/Group: Individual Therapy  Wynn Maudlin, DPT Acute Rehabilitation Services Office [phone]  12/03/22 2:20 PM

## 2022-12-03 NOTE — Progress Notes (Signed)
Occupational Therapy Session Note  Patient Details  Name: Maurice Munoz MRN: [id number] Date of Birth: 1946-03-18  Today's Date: 12/03/2022 OT Individual Time: 8657-8469 OT Individual Time Calculation (min): 70 min    Short Term Goals: Week 1:  OT Short Term Goal 1 (Week 1): STG=LTG's d/t LOS  Skilled Therapeutic Interventions/Progress Updates:  Skilled OT intervention completed with focus on nausea management, activity tolerance, BUE endurance, core strengthening and functional transfers. Pt received seated in w/c, agreeable to session. Unrated pain reported in R knee; pre-medicated. OT offered rest breaks and repositioning throughout for pain reduction.  Pt indicated he feels a little better than yesterday but with new nausea about 45 mins prior to OT arrival with meds taken without food. Did offer pt graham crackers/peanut butter per pt approved diet, and pt consumed at start of session. Education provided on taking meds with snacks with meds are given at odd/non-meal times. Pt with good recall of events leading up to current hospitalization and expressed his current challenge areas.  Transported dependently in w/c <> gym for energy conservation. Deferred activities in standing/walking for pain prevention as pt politely unable to tolerate WB for activities.   In unsupported sitting in w/c, pt placed/removed squigz, one trial per hand to promote BUE endurance/gentle AROM, cardiovascular endurance and anterior weight shifting needed for sit > stands.    Completed the following posterior chain/core exercises needed for postural stability in stance during ADLs with 5 lb med ball: -trunk flexion with controlled trunk extension -trunk extension with 3 sec hold for modified crunch; compensatory BLE lift noted with cues needed for core control  Back in room, pt completed min A sit > stand with bed rail assist, then stand pivot with CGA to EOB however hard landing due to poor eccentric control  with cues needed to avoid for safety. Doffed shoes with supervision. Transitioned to semi upright in bed with min A for RLE.   Pt remained upright in bed with BLE elevated for edema management, with bed alarm on/activated, and with all needs in reach at end of session.  Vitals -Received on 2L via [place], SPO2 98% -95% after seated activity; mild SOB however remained on 2L per MD order to maintain  Therapy Documentation Precautions:  Precautions Precautions: Fall Precaution Comments: watch O2 and BP Restrictions Weight Bearing Restrictions: No    Therapy/Group: Individual Therapy  Melvyn Novas, MS, OTR/L  12/03/2022, 12:17 PM

## 2022-12-03 NOTE — Progress Notes (Signed)
Resting BP this am 108/60. Metoprolol held this AM.     12/03/22 0819 12/03/22 0827  Vitals  BP 108/60 97/75  Patient Position (if appropriate) Sitting Standing  Pulse Rate 66 69

## 2022-12-04 DIAGNOSIS — I959 Hypotension, unspecified: Secondary | ICD-10-CM

## 2022-12-04 DIAGNOSIS — M1711 Unilateral primary osteoarthritis, right knee: Secondary | ICD-10-CM

## 2022-12-04 DIAGNOSIS — R5381 Other malaise: Secondary | ICD-10-CM | POA: Diagnosis not present

## 2022-12-04 DIAGNOSIS — I10 Essential (primary) hypertension: Secondary | ICD-10-CM | POA: Diagnosis not present

## 2022-12-04 DIAGNOSIS — I272 Pulmonary hypertension, unspecified: Secondary | ICD-10-CM

## 2022-12-04 DIAGNOSIS — I25810 Atherosclerosis of coronary artery bypass graft(s) without angina pectoris: Secondary | ICD-10-CM

## 2022-12-04 NOTE — Progress Notes (Signed)
Physical Therapy Session Note  Patient Details  Name: [name] MRN: [id number] Date of Birth: 02-12-1946  Today's Date: 12/04/2022 PT Individual Time: 1300-1355 PT Individual Time Calculation (min): 55 min   Short Term Goals: Week 1:  PT Short Term Goal 1 (Week 1): STG = LTG 2/2 LOS  Skilled Therapeutic Interventions/Progress Updates: Pt presents sitting in w/c and agreeable to therapy.  O2 sats at 97%.  Knee high TEDS donned in sitting Total A.  Pt states minimal pain when resting.  Pt wheeled to dayroom.  Pt transferred sit to stand multiple trials w/ min A and verbal cues for improved performance 2/2 pain.  Pt amb multiple trials approx. 30' w/ increased support needed from UES, decreased foot clearance.  Pt requires seated rest breaks 2/2 fatigue but O2 sats remained > 96% on 2 LPM.  Pt performed 3 cone obstacle course w/ O2 sats decreasing to 91%, requiring 2-3" to return to > 94%.  Pt amb x 30' into room and to bed.  Pt transferred sit to supine w/ supervision.  Bed alarm on and all needs in reach.  Missed time 5' 2/2 fatigue.     Therapy Documentation Precautions:  Precautions Precautions: Fall Precaution Comments: watch O2 and BP Restrictions Weight Bearing Restrictions: (P) No General: PT Amount of Missed Time (min): 5 Minutes PT Missed Treatment Reason: Patient fatigue Vital Signs:   Pain: 8/10 R knee.      Therapy/Group: Individual Therapy  Lucio Edward 12/04/2022, 2:02 PM

## 2022-12-04 NOTE — Progress Notes (Signed)
PROGRESS NOTE   Subjective/Complaints: No acute events overnight noted.  Patient reports he continues to have knee pain, headaches, back pain.  He just had tramadol a few minutes ago and oxycodone earlier this morning.  He wants to see how the tramadol does for his pain or making any changes.  ROS: +knee pain- improved with hydrocodone, + lower back pain, HA, +lower extremity swelling Denies shortness of breath, chest pain, abdominal pain, nausea or vomiting  Objective:   No results found. No results for input(s): "WBC", "HGB", "HCT", "PLT" in the last 72 hours.  No results for input(s): "NA", "K", "CL", "CO2", "GLUCOSE", "BUN", "CREATININE", "CALCIUM" in the last 72 hours.   Intake/Output Summary (Last 24 hours) at 12/04/2022 1303 Last data filed at 12/04/2022 0729 Gross per 24 hour  Intake 480 ml  Output 1175 ml  Net -695 ml         Physical Exam: Vital Signs Blood pressure 123/70, pulse 67, temperature 97.8 F (36.6 C), resp. rate 18, height 6\' 1"  (1.854 m), weight 77.6 kg, SpO2 96 %. Constitutional:      General: He is not in acute distress.    Comments: Multiple bandages  HENT:     Head:     Comments: Abrasions on forehead, nose    Right Ear: External ear normal.     Left Ear: External ear normal.  Eyes:     Extraocular Movements: Extraocular movements intact.     Conjunctiva/sclera: Conjunctivae normal.     Pupils: Pupils are equal, round, and reactive to light.  Cardiovascular:     Rate and Rhythm: Normal rate and regular rhythm.     Heart sounds: No murmur heard. Pulmonary:     Effort: Pulmonary effort is normal. No respiratory distress.     Breath sounds: No wheezing or rales.     Comments: O2 [place]- satting in normal ranges Abdominal:     General: There is no distension.     Palpations: Abdomen is soft.     Tenderness: There is no abdominal tenderness.  Musculoskeletal:     Cervical back: No rigidity  or tenderness.     Right lower leg: Edema present.     Left lower leg: Edema present.     Comments: Pt demonstrated pain in the right patella and patellar tendon with palpation and with bending near 90 degrees.   Skin:    Comments: Head and nose wounds as above. Large wound at right wrist with dry dressing intact. Smaller left wrist wound. Superficial quarter sized wounds on each inferior patella area. Bruising left lateral foot.   Neurological:     Comments: Alert and oriented x 3. Normal insight and awareness. Intact Memory. Normal language and speech. Cranial nerve exam unremarkable. MMT: 4/5 BUE with limitations distally due to dressings/wrist wounds. BLE 3+/5 HF, KE, 4/5 ADF/PF. No gross sensory deficits appreciated. Normal tone.   Psychiatric:   Bright and positive.  Extremities: lower extremity edema improved   Assessment/Plan: 1. Functional deficits which require 3+ hours per day of interdisciplinary therapy in a comprehensive inpatient rehab setting. Physiatrist is providing close team supervision and 24 hour management of active medical problems listed  below. Physiatrist and rehab team continue to assess barriers to discharge/monitor patient progress toward functional and medical goals  Care Tool:  Bathing    Body parts bathed by patient: Right arm, Left arm, Chest, Abdomen, Front perineal area, Buttocks, Right upper leg, Left upper leg   Body parts bathed by helper: Right lower leg, Left lower leg     Bathing assist Assist Level: Maximal Assistance - Patient 24 - 49%     Upper Body Dressing/Undressing Upper body dressing   What is the patient wearing?: Button up shirt    Upper body assist Assist Level: Moderate Assistance - Patient 50 - 74%    Lower Body Dressing/Undressing Lower body dressing      What is the patient wearing?: Underwear/pull up, Pants     Lower body assist Assist for lower body dressing: Maximal Assistance - Patient 25 - 49%      Toileting Toileting    Toileting assist Assist for toileting: Moderate Assistance - Patient 50 - 74%     Transfers Chair/bed transfer  Transfers assist     Chair/bed transfer assist level: Minimal Assistance - Patient > 75%     Locomotion Ambulation   Ambulation assist      Assist level: Minimal Assistance - Patient > 75% Assistive device: Walker-standard Max distance: 50   Walk 10 feet activity   Assist     Assist level: Minimal Assistance - Patient > 75% Assistive device: Walker-standard   Walk 50 feet activity   Assist    Assist level: Minimal Assistance - Patient > 75% Assistive device: Walker-standard    Walk 150 feet activity   Assist Walk 150 feet activity did not occur: Safety/medical concerns (2/2 pain)         Walk 10 feet on uneven surface  activity   Assist     Assist level: Minimal Assistance - Patient > 75% Assistive device: Development worker, international aid Is the patient using a wheelchair?: Yes Type of Wheelchair: Manual Wheelchair activity did not occur: Safety/medical concerns (2/2 pain)         Wheelchair 50 feet with 2 turns activity    Assist    Wheelchair 50 feet with 2 turns activity did not occur: Safety/medical concerns (2/2 pain)       Wheelchair 150 feet activity     Assist  Wheelchair 150 feet activity did not occur: Safety/medical concerns (2/2 pain)       Blood pressure 123/70, pulse 67, temperature 97.8 F (36.6 C), resp. rate 18, height 6\' 1"  (1.854 m), weight 77.6 kg, SpO2 96 %.  Medical Problem List and Plan: 1. Functional deficits secondary to significant disability related to acute respiratory failure.              -patient may shower (cover right wrist )             -ELOS/Goals: 10-12 days with mod I goals  -Continue CIR PT/OT   2.  Antithrombotics: -DVT/anticoagulation:  Pharmaceutical: Heparin             -antiplatelet therapy: Aspirin 81 mg daily, Plavix    3. Right knee pain: Tylenol scheduled. XR ordered. Tramadol ordered prn. Norco increase to q4H prn. XR results show OA and enethesophyts, discussed with patient             -Zanaflex 4 mg q HS             -right knee pain after recent  fall. May have contused patella or strained patella tendon -pt reports pain is beginning to slightly improve -will check knee XR to rule out fx-degenerative changes noted -may use ice as needed -6/15 PDMP review indicates he was on Percocet 10 at home, has chronic back pain,  continue current regimen for now and monitor   4. Mood/Behavior/Sleep: LCSW to evaluate and provide emotional support             -antipsychotic agents: n/a   5. Neuropsych/cognition: This patient is capable of making decisions on his own behalf.   6. Skin/Wound Care: Routine skin care checks             -monitor multiple skin tears             -local wound care to each area.              -pad contact areas as needed for protection   7. Fluids/Electrolytes/Nutrition: Strict Is and Os and follow-up chemistries             -continue vitamin D, B12             -Heart Healthy strict 1.5 L fluid restriction per day    8: Hypertension: monitor TID and prn (?? home amlodipine and lisinopril held)             -continue Imdur 30 mh q AM (?? home 60 mg AM, 30 mg PM)             -continue Lopressor 25 mg BID (?? 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 I  messaged rehab pharmacist with regards to prednisone orders. (?? home prednisone 20 mg q AM) -continue pirfenidone -will need to establish new baseline oxygen requirement at rest and with exertion. Observe with therapy.    12: Pulmonary hypertension/right heart failure: (?? home torsemide held)             -daily weights             -currently no diuretics (states torsemide gives him hand cramps)  Started Lasix 20mg  daily  -6/15 weights downtrending continue to monitor  Filed Weights   12/02/22 0506 12/03/22 0500 12/04/22 0500  Weight: 82.4 kg 78.2 kg 77.6 kg      13: AKI atop CKD stage IIIb: creatinine reviewed, resolved  -Check Monday   14: CAD: continue aspirin, Plavix, statin             -follows with Dr. Tresa Endo  -Denies chest pain   15: Carotid stenosis: follow-up outpatient  16. Hypoalbuminemia: encouraged high protein diet. Monitor CMP weekly.   17. Hyperglycemia: Improved, discussed  elevated blood sugars and recommended avoiding added sugars in diet  -6/15 stable on recent CBGs  18. Lower extremity swelling: Increase lasix to 20mg  daily  19. Hypotension: change metoprolol to Toprol XL 25mg  HS  -6/15 improved continue to monitor trend       LOS: 4 days A FACE TO FACE EVALUATION WAS PERFORMED  Fanny Dance 12/04/2022, 1:03 PM

## 2022-12-04 NOTE — Progress Notes (Signed)
Occupational Therapy Session Note  Patient Details  Name: Maurice Munoz MRN: [id number] Date of Birth: [birth year]  Today's Date: 12/04/2022 OT Individual Time: 0750-0905, 11:00-12:00 OT Individual Time Calculation (min): 75 min , 60 mins   Short Term Goals: Week 1:  OT Short Term Goal 1 (Week 1): STG=LTG's d/t LOS  Skilled Therapeutic Interventions/Progress Updates:      (1st)  Skilled OT Session: Patient in bed awake at the time of arrival.  The pt indicated that he rested well and that he was experiencing  a pain associated with his tail bone of a 7 on a 0-10 scale and a pain response with the RLE, specifically, his knee at a 8 on a 0-10 scale.  Nursing indicated that she would provide pain med's momentarily. Pt presents with BP of 115/58, HR of 75, and 02 saturation of 99% with nasal canula in place.  The pt was in agreement with completing BADL related task of bathing and dressing at EOB, as well as,  completing edema management techniques to reduce  the swelling with BLE's. The pt began the session with heel pumps 2 sets of 15 followed by knee bends 2 sets of 5 with relaxation breathing to minimize his pain response.  The pt was able to come EOB with MinA using the bed rails and CGA. The pt was able to wash his UB with s/u A, he was able to wash his perineal  area with s/uA as well.  The pt was MaxA for BLE and for  his bottom.  The pt wasable to donn LB clothing items with  MaxA  for his underwear and pants.  He was s/uA for  using the urinal. At the end of treatment, the pt remained at w/c LOF with his call light and  bedside table within reach.  All additional needs were addressed prior to exiting the room.      (2) Skilled OT Session:  The pt was seated at w/c at [facility] at the time of arrival.  Nursing came in to administer pain medication to address residual pain response.  The MD was able to speak with the patient as well while completing rounds.  The pt was able to complete UB exercise to  improve UB strength and endurance during functional task performance.   The pt complete 2 sets of 15 for shld flexion, horizontal abduction, shld rotation, and lg circles using the 1 lb dowel with rest breaks as needed. The pt required 2 rest breaks.  The pt went on to complete bicep curls and elbow extension using the 4 lb dumb bell 2 sets of 10 with rest breaks as needed.  The pt required 1 rest break.  At the end of the session, the pt was instructed in [facility] techniques to improve his functional tolerance during BADL related task performance.  The pt was instructed to place a chair at the mid point between his bedroom and the kitchen for taking rest breaks.  He was instructed to have a central location for items that he frequently uses on counter tops and in the refrigerator.  The pt was instructed in relaxation breathing to improve compliance during functional task performance.  The pt remained at w/c LOF with all additional needs addressed prior to exiting the room.   Therapy Documentation Precautions:  Precautions Precautions: Fall Precaution Comments: watch O2 and BP Restrictions Weight Bearing Restrictions: (P) No  Therapy/Group: Individual Therapy  Lavona Mound 12/04/2022, 12:50 PM

## 2022-12-05 DIAGNOSIS — I10 Essential (primary) hypertension: Secondary | ICD-10-CM | POA: Diagnosis not present

## 2022-12-05 DIAGNOSIS — R5381 Other malaise: Secondary | ICD-10-CM | POA: Diagnosis not present

## 2022-12-05 DIAGNOSIS — I272 Pulmonary hypertension, unspecified: Secondary | ICD-10-CM | POA: Diagnosis not present

## 2022-12-05 DIAGNOSIS — J9611 Chronic respiratory failure with hypoxia: Secondary | ICD-10-CM

## 2022-12-05 DIAGNOSIS — M1711 Unilateral primary osteoarthritis, right knee: Secondary | ICD-10-CM | POA: Diagnosis not present

## 2022-12-05 MED ORDER — POLYETHYLENE GLYCOL 3350 17 G PO PACK
17.0000 g | PACK | Freq: Every day | ORAL | Status: DC
  Administered 2022-12-11: 17 g via ORAL
  Filled 2022-12-05 (×6): qty 1

## 2022-12-05 NOTE — Progress Notes (Signed)
PROGRESS NOTE   Subjective/Complaints: Sitting in bed this morning.  Reports his pain is under control.  He reports that his breathing feels better with regular use of 2 L nasal cannula O2.  At home he was using higher flow rates but much more inconsistently.  ROS: +knee pain- improved with hydrocodone, + lower back pain, HA, +lower extremity swelling Denies shortness of breath, chest pain, abdominal pain, nausea or vomiting  Objective:   No results found. No results for input(s): "WBC", "HGB", "HCT", "PLT" in the last 72 hours.  No results for input(s): "NA", "K", "CL", "CO2", "GLUCOSE", "BUN", "CREATININE", "CALCIUM" in the last 72 hours.   Intake/Output Summary (Last 24 hours) at 12/05/2022 1439 Last data filed at 12/05/2022 1436 Gross per 24 hour  Intake 720 ml  Output 2800 ml  Net -2080 ml         Physical Exam: Vital Signs Blood pressure 114/63, pulse 75, temperature 98 F (36.7 C), temperature source Oral, resp. rate 20, height 6\' 1"  (1.854 m), weight 79.2 kg, SpO2 97 %. Constitutional:      General: He is not in acute distress.  Appears comfortable sitting in bed    Comments: Multiple bandages  HENT:     Head:     Comments: Abrasions on forehead, nose-a little improved    Right Ear: External ear normal.     Left Ear: External ear normal.  Eyes:     Extraocular Movements: Extraocular movements intact.     Conjunctiva/sclera: Conjunctivae normal.     Pupils: Pupils are equal, round, and reactive to light.  Cardiovascular:     Rate and Rhythm: Normal rate and regular rhythm.     Heart sounds: No murmur heard. Pulmonary:     Effort: normal WOB    Breath sounds: No wheezing or rales. CTAB    Comments: O2 - satting in normal ranges Abdominal:     General: There is no distension.     Palpations: Abdomen is soft.     Tenderness: There is no abdominal tenderness.  Musculoskeletal:     Cervical back: No  rigidity or tenderness.     Right lower leg: Edema present.     Left lower leg: Edema present.     Comments: Pt demonstrated pain in the right patella and patellar tendon with palpation and with bending near 90 degrees.   Skin:    Comments: Head and nose wounds as above. Large wound at right wrist with dry dressing intact. Smaller left wrist wound. Superficial quarter sized wounds on each inferior patella area. Bruising left lateral foot.   Neurological:     Comments: Alert and oriented x 3. Normal insight and awareness. Intact Memory. Normal language and speech. Cranial nerve exam unremarkable. MMT: 4/5 BUE with limitations distally due to dressings/wrist wounds. BLE 3+/5 HF, KE, 4/5 ADF/PF. No gross sensory deficits appreciated. Normal tone.   Psychiatric:   Bright and positive.  Extremities: lower extremity edema improved   Assessment/Plan: 1. Functional deficits which require 3+ hours per day of interdisciplinary therapy in a comprehensive inpatient rehab setting. Physiatrist is providing close team supervision and 24 hour management of active medical problems listed  below. Physiatrist and rehab team continue to assess barriers to discharge/monitor patient progress toward functional and medical goals  Care Tool:  Bathing    Body parts bathed by patient: Right arm, Left arm, Chest, Abdomen, Front perineal area, Buttocks, Right upper leg, Left upper leg   Body parts bathed by helper: Right lower leg, Left lower leg     Bathing assist Assist Level: Maximal Assistance - Patient 24 - 49%     Upper Body Dressing/Undressing Upper body dressing   What is the patient wearing?: Button up shirt    Upper body assist Assist Level: Moderate Assistance - Patient 50 - 74%    Lower Body Dressing/Undressing Lower body dressing      What is the patient wearing?: Underwear/pull up, Pants     Lower body assist Assist for lower body dressing: Maximal Assistance - Patient 25 - 49%      Toileting Toileting    Toileting assist Assist for toileting: Moderate Assistance - Patient 50 - 74%     Transfers Chair/bed transfer  Transfers assist     Chair/bed transfer assist level: Minimal Assistance - Patient > 75%     Locomotion Ambulation   Ambulation assist      Assist level: Minimal Assistance - Patient > 75% Assistive device: Walker-rolling Max distance: 35   Walk 10 feet activity   Assist     Assist level: Minimal Assistance - Patient > 75% Assistive device: Walker-rolling   Walk 50 feet activity   Assist    Assist level: Minimal Assistance - Patient > 75% Assistive device: Walker-standard    Walk 150 feet activity   Assist Walk 150 feet activity did not occur: Safety/medical concerns (2/2 pain)         Walk 10 feet on uneven surface  activity   Assist     Assist level: Minimal Assistance - Patient > 75% Assistive device: Walker-standard   Wheelchair     Assist Is the patient using a wheelchair?: Yes Type of Wheelchair: Manual Wheelchair activity did not occur: Safety/medical concerns (2/2 pain)         Wheelchair 50 feet with 2 turns activity    Assist    Wheelchair 50 feet with 2 turns activity did not occur: Safety/medical concerns (2/2 pain)       Wheelchair 150 feet activity     Assist  Wheelchair 150 feet activity did not occur: Safety/medical concerns (2/2 pain)       Blood pressure 114/63, pulse 75, temperature 98 F (36.7 C), temperature source Oral, resp. rate 20, height 6\' 1"  (1.854 m), weight 79.2 kg, SpO2 97 %.  Medical Problem List and Plan: 1. Functional deficits secondary to significant disability related to acute respiratory failure.              -patient may shower (cover right wrist )             -ELOS/Goals: 10-12 days with mod I goals  -Continue CIR PT/OT   2.  Antithrombotics: -DVT/anticoagulation:  Pharmaceutical: Heparin             -antiplatelet therapy: Aspirin 81  mg daily, Plavix   3. Right knee pain: Tylenol scheduled. XR ordered. Tramadol ordered prn. Norco increase to q4H prn. XR results show OA and enethesophyts, discussed with patient             -Zanaflex 4 mg q HS             -right knee  pain after recent fall. May have contused patella or strained patella tendon -pt reports pain is beginning to slightly improve -will check knee XR to rule out fx-degenerative changes noted -may use ice as needed -6/15 PDMP review indicates he was on Percocet 10 at home, has chronic back pain,  continue current regimen for now and monitor  -6/16 reports pain is controlled, continue current regimen for now   4. Mood/Behavior/Sleep: LCSW to evaluate and provide emotional support             -antipsychotic agents: n/a   5. Neuropsych/cognition: This patient is capable of making decisions on his own behalf.   6. Skin/Wound Care: Routine skin care checks             -monitor multiple skin tears             -local wound care to each area.              -pad contact areas as needed for protection   7. Fluids/Electrolytes/Nutrition: Strict Is and Os and follow-up chemistries             -continue vitamin D, B12             -Heart Healthy strict 1.5 L fluid restriction per day    8: Hypertension: monitor TID and prn (?? home amlodipine and lisinopril held)             -continue Imdur 30 mh q AM (?? home 60 mg AM, 30 mg PM)             -continue Lopressor 25 mg BID (?? 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 I  messaged rehab pharmacist with regards to prednisone orders. (?? home prednisone 20 mg q AM) -continue pirfenidone -will need to establish new baseline oxygen requirement at rest and with exertion. Observe with therapy.  -6/16 he reports his breathing is doing better, appears stable on 2 L nasal cannula   12: Pulmonary hypertension/right heart failure: (?? home torsemide held)             -daily weights             -currently no diuretics (states torsemide gives him hand cramps)  Started Lasix 20mg  daily  -6/16 weight a little bit increased from yesterday but still decreased from prior.  Monitor for fluid overload  Filed Weights   12/03/22 0500 12/04/22 0500 12/05/22 0500  Weight: 78.2 kg 77.6 kg 79.2 kg      13: AKI atop CKD stage IIIb: creatinine reviewed, resolved  -Check Monday   14:  CAD: continue aspirin, Plavix, statin             -follows with Dr. Tresa Endo  -Denies chest pain   15: Carotid stenosis: follow-up outpatient  16. Hypoalbuminemia: encouraged high protein diet. Monitor CMP weekly.   17. Hyperglycemia: Improved, discussed elevated blood sugars and recommended avoiding added sugars in diet  -6/15 stable on recent CBGs  18. Lower extremity swelling: Increase lasix to 20mg  daily  19. Hypotension: change metoprolol to Toprol XL 25mg  HS  -6/15 improved continue to monitor trend       LOS: 5 days A FACE TO FACE EVALUATION WAS PERFORMED  Fanny Dance 12/05/2022, 2:39 PM

## 2022-12-06 DIAGNOSIS — R5381 Other malaise: Secondary | ICD-10-CM | POA: Diagnosis not present

## 2022-12-06 LAB — CBC
HCT: 43.5 % (ref 39.0–52.0)
Hemoglobin: 14.2 g/dL (ref 13.0–17.0)
MCH: 30.3 pg (ref 26.0–34.0)
MCHC: 32.6 g/dL (ref 30.0–36.0)
MCV: 92.8 fL (ref 80.0–100.0)
Platelets: 216 10*3/uL (ref 150–400)
RBC: 4.69 MIL/uL (ref 4.22–5.81)
RDW: 14.6 % (ref 11.5–15.5)
WBC: 12.5 10*3/uL — ABNORMAL HIGH (ref 4.0–10.5)
nRBC: 0 % (ref 0.0–0.2)

## 2022-12-06 LAB — BASIC METABOLIC PANEL
Anion gap: 8 (ref 5–15)
BUN: 31 mg/dL — ABNORMAL HIGH (ref 8–23)
CO2: 32 mmol/L (ref 22–32)
Calcium: 8.7 mg/dL — ABNORMAL LOW (ref 8.9–10.3)
Chloride: 95 mmol/L — ABNORMAL LOW (ref 98–111)
Creatinine, Ser: 1.22 mg/dL (ref 0.61–1.24)
GFR, Estimated: >60 mL/min (ref >60)
Glucose, Bld: 82 mg/dL (ref 70–99)
Potassium: 3.9 mmol/L (ref 3.5–5.1)
Sodium: 135 mmol/L (ref 135–145)

## 2022-12-06 NOTE — Progress Notes (Signed)
PROGRESS NOTE   Subjective/Complaints:  R>L knee pain, no further falls (only at home PTA)  ROS: +knee pain- improved with hydrocodone, + lower back pain, HA, +lower extremity swelling Denies shortness of breath, chest pain, abdominal pain, nausea or vomiting  Objective:   No results found. Recent Labs    12/06/22 0554  WBC 12.5*  HGB 14.2  HCT 43.5  PLT 216    Recent Labs    12/06/22 0554  NA 135  K 3.9  CL 95*  CO2 32  GLUCOSE 82  BUN 31*  CREATININE 1.22  CALCIUM 8.7*     Intake/Output Summary (Last 24 hours) at 12/06/2022 0758 Last data filed at 12/06/2022 0737 Gross per 24 hour  Intake 600 ml  Output 2300 ml  Net -1700 ml         Physical Exam: Vital Signs Blood pressure 134/78, pulse 63, temperature 97.7 F (36.5 C), resp. rate 18, height 6\' 1"  (1.854 m), weight 79.2 kg, SpO2 98 %.  General: No acute distress Mood and affect are appropriate Heart: Regular rate and rhythm no rubs murmurs or extra sounds Lungs: Clear to auscultation, breathing unlabored, no rales or wheezes Abdomen: Positive bowel sounds, soft nontender to palpation, nondistended Extremities: No clubbing, cyanosis, 1+ right pedal  edema Skin: No evidence of breakdown, no evidence of rash   Musculoskeletal:     Cervical back: No rigidity or tenderness.     Right lower leg: Edema present.     Left lower leg: Edema present.     Comments: Pt demonstrated pain in the right patella and patellar tendon with palpation and with bending near 90 degrees.   Skin:    Comments: Head and nose wounds as above. Large wound at right wrist with dry dressing intact. Smaller left wrist wound. Superficial quarter sized wounds on each inferior patella area. Bruising left lateral foot.   Neurological:     Comments: Alert and oriented x 3.  Normal language and speech. Cranial nerve exam unremarkable. MMT: 4/5 BUE with limitations distally due to  dressings/wrist wounds. BLE 3+/5 HF, KE, 4/5 ADF/PF. No gross sensory deficits appreciated. Normal tone.   Psychiatric:   Bright and positive.  Extremities: lower extremity edema improved   Assessment/Plan: 1. Functional deficits which require 3+ hours per day of interdisciplinary therapy in a comprehensive inpatient rehab setting. Physiatrist is providing close team supervision and 24 hour management of active medical problems listed below. Physiatrist and rehab team continue to assess barriers to discharge/monitor patient progress toward functional and medical goals  Care Tool:  Bathing    Body parts bathed by patient: Right arm, Left arm, Chest, Abdomen, Front perineal area, Buttocks, Right upper leg, Left upper leg   Body parts bathed by helper: Right lower leg, Left lower leg     Bathing assist Assist Level: Maximal Assistance - Patient 24 - 49%     Upper Body Dressing/Undressing Upper body dressing   What is the patient wearing?: Button up shirt    Upper body assist Assist Level: Moderate Assistance - Patient 50 - 74%    Lower Body Dressing/Undressing Lower body dressing      What is  the patient wearing?: Underwear/pull up, Pants     Lower body assist Assist for lower body dressing: Maximal Assistance - Patient 25 - 49%     Toileting Toileting    Toileting assist Assist for toileting: Moderate Assistance - Patient 50 - 74%     Transfers Chair/bed transfer  Transfers assist     Chair/bed transfer assist level: Minimal Assistance - Patient > 75%     Locomotion Ambulation   Ambulation assist      Assist level: Minimal Assistance - Patient > 75% Assistive device: Walker-rolling Max distance: 35   Walk 10 feet activity   Assist     Assist level: Minimal Assistance - Patient > 75% Assistive device: Walker-rolling   Walk 50 feet activity   Assist    Assist level: Minimal Assistance - Patient > 75% Assistive device: Walker-standard     Walk 150 feet activity   Assist Walk 150 feet activity did not occur: Safety/medical concerns (2/2 pain)         Walk 10 feet on uneven surface  activity   Assist     Assist level: Minimal Assistance - Patient > 75% Assistive device: Development worker, [employer] aid Is the patient using a wheelchair?: Yes Type of Wheelchair: Manual Wheelchair activity did not occur: Safety/medical concerns (2/2 pain)         Wheelchair 50 feet with 2 turns activity    Assist    Wheelchair 50 feet with 2 turns activity did not occur: Safety/medical concerns (2/2 pain)       Wheelchair 150 feet activity     Assist  Wheelchair 150 feet activity did not occur: Safety/medical concerns (2/2 pain)       Blood pressure 134/78, pulse 63, temperature 97.7 F (36.5 C), resp. rate 18, height 6\' 1"  (1.854 m), weight 79.2 kg, SpO2 98 %.  Medical Problem List and Plan: 1. Functional deficits secondary to significant disability related to acute respiratory failure.              -patient may shower (cover right wrist )             -ELOS/Goals: 10-12 days with mod I goals  -Continue CIR PT/OT   2.  Antithrombotics: -DVT/anticoagulation:  Pharmaceutical: Heparin             -antiplatelet therapy: Aspirin 81 mg daily, Plavix   3. Right knee pain: Tylenol scheduled. XR ordered. Tramadol ordered prn. Norco increase to q4H prn. XR results show OA and enethesophyts, discussed with patient             -Zanaflex 4 mg q HS             -right knee pain after recent fall. May have contused patella or strained patella tendon -pt reports pain is beginning to slightly improve -will check knee XR to rule out fx-degenerative changes noted -may use ice as needed -6/15 PDMP review indicates he was on Percocet 10 at home, has chronic back pain,  continue current regimen for now and monitor  -6/16 reports pain is controlled, continue current regimen for now   4.  Mood/Behavior/Sleep: LCSW to evaluate and provide emotional support             -antipsychotic agents: n/a   5. Neuropsych/cognition: This patient is capable of making decisions on his own behalf.   6. Skin/Wound Care: Routine skin care checks             -  monitor multiple skin tears             -local wound care to each area.              -pad contact areas as needed for protection   7. Fluids/Electrolytes/Nutrition: Strict Is and Os and follow-up chemistries             -continue vitamin D, B12             -Heart Healthy strict 1.5 L fluid restriction per day    8: Hypertension: monitor TID and prn (?? home amlodipine and lisinopril held)             -continue Imdur 30 mh q AM (?? home 60 mg AM, 30 mg PM)             -continue Lopressor 25 mg BID (?? 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 I  messaged rehab pharmacist with regards to prednisone orders. (?? home prednisone 20 mg q AM) -continue pirfenidone -will need to establish new baseline oxygen requirement at rest and with exertion. Observe with therapy.  -6/16 he reports his breathing is doing better, appears stable on 2 L nasal cannula   12: Pulmonary hypertension/right heart failure: (?? home torsemide held)              [employer]   12/03/22 0500 12/04/22 0500 12/05/22 0500  Weight: 78.2 kg 77.6 kg 79.2 kg                -currently no diuretics  (states torsemide gives him hand cramps)  Started Lasix 20mg  daily  -6/16 weight a little bit increased from yesterday but still decreased from prior.  Monitor for fluid overload  Filed Weights   12/03/22 0500 12/04/22 0500 12/05/22 0500  Weight: 78.2 kg 77.6 kg 79.2 kg      13: AKI atop CKD stage IIIb: creatinine reviewed, resolved      Latest Ref Rng & Units 12/06/2022    5:54 AM 12/01/2022    5:48 AM 11/30/2022    4:04 AM  BMP  Glucose 70 - 99 mg/dL 82  440  347   BUN 8 - 23 mg/dL 31  41  38   Creatinine 0.61 - 1.24 mg/dL 4.25  9.56  3.87   Sodium 135 - 145 mmol/L 135  138  137   Potassium 3.5 - 5.1 mmol/L 3.9  4.3  4.3   Chloride 98 - 111 mmol/L 95  97  99   CO2 22 - 32 mmol/L 32  32  32   Calcium 8.9 - 10.3 mg/dL 8.7  8.9  8.7    BUN improving  despite lasix addition    14: CAD: continue aspirin, Plavix, statin             -follows with Dr. Tresa Endo  -Denies chest pain   15: Carotid stenosis: follow-up outpatient  16. Hypoalbuminemia: encouraged high protein diet. Monitor CMP weekly.   17. Hyperglycemia: Improved, discussed elevated blood sugars and recommended avoiding added sugars in diet .cbg  18. Lower extremity swelling: Increase lasix to 20mg  daily  19. Hypotension: change metoprolol to Toprol XL 25mg  HS  -6/15 improved continue to monitor trend Vitals:   12/05/22 2011 12/06/22 0516  BP: 125/76 134/78  Pulse: 91 63  Resp: 18 18  Temp: 98.4 F (36.9 C) 97.7 F (36.5 C)  SpO2: 96% 98%         LOS: 6 days A FACE TO FACE EVALUATION WAS PERFORMED  Erick Colace 12/06/2022, 7:58 AM

## 2022-12-06 NOTE — Progress Notes (Signed)
Occupational Therapy Session Note  Patient Details  Name: Maurice Munoz MRN: [id number] Date of Birth: 10-Mar-1946  Today's Date: 12/06/2022 OT Individual Time: 1107-1202 & 4098-1191 OT Individual Time Calculation (min): 55 min & 40 min   Short Term Goals: Week 1:  OT Short Term Goal 1 (Week 1): STG=LTG's d/t LOS  Skilled Therapeutic Interventions/Progress Updates:  Session 1 Skilled OT intervention completed with focus on pain management, BUE/cardiovascular and respiratory endurance, functional transfers. Pt received seated in w/c, agreeable to session. Headache and R knee pain and moderate nausea reported; OT checked med schedule with pt not due for mild pain med until 12 and pt politely declined stronger meds at this time. OT offered graham crackers for nausea management, as well as rest breaks, repositioning throughout for pain reduction.  Pt consumed the snack, and politely declined shower and standing/mobility due to pain with request to remain seated due to earlier long walk with PT. Discussed ways to manage wound care/coverage with shower at home.   Transported dependently in w/c <> gym for energy conservation. Completed the following intervals on SCIFIT for BUE/cardiovascular/respiratory endurance: -3 min x 2 on level 5, forwards -3 min x 2 on level 5, backwards Pt required rest breaks in between, with cues needed for pursed lip breathing strategy to manage O2 however sats WNL (see below) with primary c/o fatigue. Discussed meaning of RPE and the "talk test" for managing energy with mobility at home as well.  Back in room, pt completed CGA sit > stand and stand pivot with use of bed rail to EOB in prep for lunch. Pt remained seated EOB, with bed alarm on/activated, meal in front of him (items already cut up by NT) and with all needs in reach at end of session.  Vitals -Received on 2.5L via [place], SPO2 98% -Dropped as low as 93%% on 2L with activity but recovered to 95% and  above -Remained on 2L per MD order for pulmonary fibrosis management vs titration down   Session 2 Skilled OT intervention completed with focus on BUE endurance/strengthening. Pt received supine in bed expressing fatigue from ambulation with PT but agreeable to session. Unrated pain reported in R knee; pre-medicated. OT offered rest breaks, repositioning throughout for pain reduction.  Pt politely requested to limit mobility due to max effort given in prior PT session again. Pt transitioned to EOB with supervision, then agreeable to BUE exercises. Pt completed the following to promote endurance/strength needed for BADLs: (With yellow theraband) 2x10 reps Horizontal abduction Self-anchored shoulder flexion each arm Self-anchored bicep flexion each arm Self-anchored tricep extension each arm Shoulder external rotation Shoulder diagonal pulls  Pt was able to participate in singing gospel songs along with music during exercises and rest breaks to incorporate earlier discussed "talk test" and "controlled breath" with his RPE.  Vitals -Received on 2L via [place], SPO2 97% -96% after activity therefore remained on 2L per order  Transitioned back to supine with supervision. Pt remained semi supine, with bed alarm on/activated, and with all needs in reach at end of session.   Therapy Documentation Precautions:  Precautions Precautions: Fall Precaution Comments: watch O2 and BP Restrictions Weight Bearing Restrictions: (P) No    Therapy/Group: Individual Therapy  Melvyn Novas, MS, OTR/L  12/06/2022, 3:41 PM

## 2022-12-06 NOTE — Progress Notes (Addendum)
Physical Therapy Session Note  Patient Details  Name: Maurice Munoz MRN: [id number] Date of Birth: 1946-04-27  Today's Date: 12/06/2022 PT Individual Time: 2841-3244 + 1245 - 1330 PT Individual Time Calculation (min): 70 min + 45 min  Short Term Goals: Week 1:  PT Short Term Goal 1 (Week 1): STG = LTG 2/2 LOS  Skilled Therapeutic Interventions/Progress Updates:     Session 1: Chart reviewed and pt agreeable to therapy. Pt received semi-reclined in bed with 7/10 c/o pain B knees with activity. Also of note, pt on 2L O2 via [place]. Session focused on O2 management, functional mobility with self-care, and amb to promote safe home access. Pt initiated session with discussion with PT regarding O2 dependency prior to admission. Pt relayed prior experience of using O2 only with movement and stopping with seated rest and sleep. Pt reported feeling very out of breath after subsequent activity and sometimes "passing out." PT and pt discussed potential need to complete O2 assessment and message relayed to primary care team by PT. Pt then completed UB/LB dressing using set up. Pt then completed amb to/from room as pt waiting on special medication for lungs. During amb pt, SpO2 monitored and reports as following:  At rest before amb: 2L O2: 100% After ambulation of 117ft on 2L O2: 91% After 3 mins rest at 2L O2: 100% After ambulation of 147ft on 3L O2: 93% After 3 mins rest at 3L O2: 100% After ambulation of 170ft on 4L O2: 96%   At end of session, pt was at 98% on 2L O2 via [place] consistent with start of session. Pt also left seated in [facility] with alarm engaged, nurse call bell and all needs in reach. Portable O2 tank removed from room.  Session 2: Chart reviewed and pt agreeable to therapy. Pt received semi-reclined in bed with same c/o pain as prior session. Also of note, pt on 2L O2 via [place]. Session focused on amb independence and activity tolerance to promote same home access. Pt initiated session with  blocked practice of 159ft amb using CGA + RW and progressing to S + RW all on 2L O2 with SpO2 >91%. Pt then completed SPT to NuStep with S + RW and interval training on NuStep for 10 mins at workloads 2-9 all with 2 L O2 to maintain VS. PT and pt also discussed newly installed ramped entrance with pt reporting the step to be 8" high and the ramp 58ft long by 75ft wide. Pt returned to bed with SPT using S + RW. At end of session, pt was left semi-reclined in bed with alarm engaged, nurse call bell and all needs in reach. Portable O2 tank removed from room.    Therapy Documentation Precautions:  Precautions Precautions: Fall Precaution Comments: watch O2 and BP Restrictions Weight Bearing Restrictions: No General:       Therapy/Group: Individual Therapy  Dionne Milo, PT, DPT 12/06/2022, 12:00 PM

## 2022-12-06 NOTE — Progress Notes (Signed)
Met with patient. He reports that he is going home this Saturday.  Wears oxygen on at home and will need to take rest breaks when doing task. Encourage to record daily weights first thing in the morning and document on daily record sheet. Also encouraged to take B/P in am and in the evening and provide both to PCP/heart MD follow ups. Dressings intact with just bruise on head and scattered scabs on bridge of nose. No other concerns at this time. NT in room for vitals.

## 2022-12-07 DIAGNOSIS — R739 Hyperglycemia, unspecified: Secondary | ICD-10-CM

## 2022-12-07 NOTE — Progress Notes (Signed)
Physical Therapy Session Note  Patient Details  Name: Maurice Munoz MRN: [id number] Date of Birth: [birth year]  Today's Date: 12/07/2022 PT Individual Time: 0800-0840 + 1130 - 1155 PT Individual Time Calculation (min): 40 min + 25 min  Short Term Goals: Week 1:  PT Short Term Goal 1 (Week 1): STG = LTG 2/2 LOS  Skilled Therapeutic Interventions/Progress Updates:    Session 1: Chart reviewed and pt agreeable to therapy. Pt received semi-reclined in bed with 5/10 c/o pain in B knees. Also of note, pt on 2L O2 via [place] t/o session with VSS. Session focused on amb independence and activity tolerance to promote same home access. Pt initiated session with 14ft amb using S + RW on 2L O2 with SpO2 >92%. Pt then completed SPT to NuStep with S + RW and interval training on NuStep for 12 mins at workloads 3-10 all with 2 L O2 to maintain VS. Pt returned to room and WC using S + RW. At end of session, pt was left seated in [facility] with alarm engaged, nurse call bell and all needs in reach. Portable O2 tank removed from room.     Session 2: Chart reviewed and pt agreeable to therapy. Pt received semi-reclined in bed with 5/10 c/o pain in B knees. Also of note, pt on 2L O2 via [place] t/o session with VSS. Session focused on activity tolerance to promote same home access. Pt completed blocked practice of 134ft amb using S + RW for multiple rounds on 2L O2 with SpO2 >93%. Pt noted to need increased time for all amb 2/2 fatigue, however pt showing improvement in endurance and SpO2 response after activity. At end of session, pt was left seated in [facility] with alarm engaged, nurse call bell and all needs in reach. Portable O2 tank removed from room.   Therapy Documentation Precautions:  Precautions Precautions: Fall Precaution Comments: watch O2 and BP Restrictions Weight Bearing Restrictions: No General:       Therapy/Group: Individual Therapy  Dionne Milo, PT, DPT 12/07/2022, 8:43 AM

## 2022-12-07 NOTE — Progress Notes (Signed)
PROGRESS NOTE   Subjective/Complaints: No new concerns this morning.  Reports his pain is gradually improving.  Reports he had a bowel movement yesterday.  ROS: +knee pain- improved with hydrocodone, + lower back pain, HA, +lower extremity swelling Denies shortness of breath, chest pain, abdominal pain, nausea, vomiting, constipation or diarrhea Objective:   No results found. Recent Labs    12/06/22 0554  WBC 12.5*  HGB 14.2  HCT 43.5  PLT 216    Recent Labs    12/06/22 0554  NA 135  K 3.9  CL 95*  CO2 32  GLUCOSE 82  BUN 31*  CREATININE 1.22  CALCIUM 8.7*     Intake/Output Summary (Last 24 hours) at 12/07/2022 0825 Last data filed at 12/07/2022 0738 Gross per 24 hour  Intake 800 ml  Output 1750 ml  Net -950 ml         Physical Exam: Vital Signs Blood pressure 138/68, pulse 64, temperature 97.8 F (36.6 C), temperature source Oral, resp. rate 18, height 6\' 1"  (1.854 m), weight 80.3 kg, SpO2 98 %.  General: No acute distress Head and neck: Abrasion on forehead and nose-gradually improving Mood and affect are appropriate Heart: Regular rate and rhythm no rubs murmurs or extra sounds Lungs: Clear to auscultation, breathing unlabored, no rales or wheezes, O2 nasal cannula in place Abdomen: Positive bowel sounds, soft nontender to palpation, nondistended Extremities: No clubbing, cyanosis, 1+ right pedal  edema    Musculoskeletal:     Cervical back: No rigidity or tenderness.     Right lower leg: Edema present.     Left lower leg: Edema present.     Comments: Pt demonstrated pain in the right patella and patellar tendon with palpation and with bending near 90 degrees.   Skin:    Comments: Head and nose wounds as above. Large wound at right wrist with dry dressing intact. Smaller left wrist wound. Superficial quarter sized wounds on each inferior patella area. Bruising left lateral foot.    Neurological:     Comments: Alert and oriented x 3.  Normal language and speech. Cranial nerve exam unremarkable. MMT: 4/5 BUE with limitations distally due to dressings/wrist wounds. BLE 3+/5 HF, KE, 4/5 ADF/PF. No gross sensory deficits appreciated. Normal tone.   Psychiatric:   Bright and positive.  Extremities: lower extremity edema improved   Assessment/Plan: 1. Functional deficits which require 3+ hours per day of interdisciplinary therapy in a comprehensive inpatient rehab setting. Physiatrist is providing close team supervision and 24 hour management of active medical problems listed below. Physiatrist and rehab team continue to assess barriers to discharge/monitor patient progress toward functional and medical goals  Care Tool:  Bathing    Body parts bathed by patient: Right arm, Left arm, Chest, Abdomen, Front perineal area, Buttocks, Right upper leg, Left upper leg   Body parts bathed by helper: Right lower leg, Left lower leg     Bathing assist Assist Level: Maximal Assistance - Patient 24 - 49%     Upper Body Dressing/Undressing Upper body dressing   What is the patient wearing?: Button up shirt    Upper body assist Assist Level: Moderate Assistance - Patient  50 - 74%    Lower Body Dressing/Undressing Lower body dressing      What is the patient wearing?: Underwear/pull up, Pants     Lower body assist Assist for lower body dressing: Maximal Assistance - Patient 25 - 49%     Toileting Toileting    Toileting assist Assist for toileting: Moderate Assistance - Patient 50 - 74%     Transfers Chair/bed transfer  Transfers assist     Chair/bed transfer assist level: Minimal Assistance - Patient > 75%     Locomotion Ambulation   Ambulation assist      Assist level: Minimal Assistance - Patient > 75% Assistive device: Walker-rolling Max distance: 35   Walk 10 feet activity   Assist     Assist level: Minimal Assistance - Patient >  75% Assistive device: Walker-rolling   Walk 50 feet activity   Assist    Assist level: Minimal Assistance - Patient > 75% Assistive device: Walker-standard    Walk 150 feet activity   Assist Walk 150 feet activity did not occur: Safety/medical concerns (2/2 pain)         Walk 10 feet on uneven surface  activity   Assist     Assist level: Minimal Assistance - Patient > 75% Assistive device: Development worker, international aid Is the patient using a wheelchair?: Yes Type of Wheelchair: Manual Wheelchair activity did not occur: Safety/medical concerns (2/2 pain)         Wheelchair 50 feet with 2 turns activity    Assist    Wheelchair 50 feet with 2 turns activity did not occur: Safety/medical concerns (2/2 pain)       Wheelchair 150 feet activity     Assist  Wheelchair 150 feet activity did not occur: Safety/medical concerns (2/2 pain)       Blood pressure 138/68, pulse 64, temperature 97.8 F (36.6 C), temperature source Oral, resp. rate 18, height 6\' 1"  (1.854 m), weight 80.3 kg, SpO2 98 %.  Medical Problem List and Plan: 1. Functional deficits secondary to significant disability related to acute respiratory failure.              -patient may shower (cover right wrist )             -ELOS/Goals: 10-12 days with mod I goals  -Continue CIR PT/OT  -Team conference tomorrow   2.  Antithrombotics: -DVT/anticoagulation:  Pharmaceutical: Heparin             -antiplatelet therapy: Aspirin 81 mg daily, Plavix   3. Right knee pain: Tylenol scheduled. XR ordered. Tramadol ordered prn. Norco increase to q4H prn. XR results show OA and enethesophyts, discussed with patient             -Zanaflex 4 mg q HS             -right knee pain after recent fall. May have contused patella or strained patella tendon -pt reports pain is beginning to slightly improve -will check knee XR to rule out fx-degenerative changes noted -may use ice as needed -6/15  PDMP review indicates he was on Percocet 10 at home, has chronic back pain,  continue current regimen for now and monitor  -6/16 reports pain is controlled, continue current regimen for now   4. Mood/Behavior/Sleep: LCSW to evaluate and provide emotional support             -antipsychotic agents: n/a   5. Neuropsych/cognition: This patient is capable  of making decisions on his own behalf.   6. Skin/Wound Care: Routine skin care checks             -monitor multiple skin tears             -local wound care to each area.              -pad contact areas as needed for protection   7. Fluids/Electrolytes/Nutrition: Strict Is and Os and follow-up chemistries             -continue vitamin D, B12             -Heart Healthy strict 1.5 L fluid restriction per day    8: Hypertension: monitor TID and prn (?? home amlodipine and lisinopril held)             -continue Imdur 30 mh q AM (?? home 60 mg AM, 30 mg PM)             -continue Lopressor 25 mg BID (?? 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 I  messaged rehab pharmacist with regards to prednisone orders. (?? home prednisone 20 mg q AM) -continue pirfenidone -will need to establish new baseline oxygen requirement at rest and with exertion. Observe with therapy.  -6/16 he reports his breathing is doing better, appears stable on 2 L nasal cannula   12: Pulmonary hypertension/right heart failure:  (?? home torsemide held)              [employer]   12/04/22 0500 12/05/22 0500 12/07/22 0500  Weight: 77.6 kg 79.2 kg 80.3 kg                -currently no diuretics (states torsemide gives him hand cramps)  Started Lasix 20mg  daily  -6/17 weight increased a little from yesterday, stable from several days prior.  Monitor for fluid overload.  Continue Lasix and consider increase dose if weight continues to increase  Filed Weights   12/04/22 0500 12/05/22 0500 12/07/22 0500  Weight: 77.6 kg 79.2 kg 80.3 kg      13: AKI atop CKD stage IIIb: creatinine reviewed, resolved      Latest Ref Rng & Units 12/06/2022    5:54 AM 12/01/2022    5:48 AM 11/30/2022    4:04 AM  BMP  Glucose 70 - 99 mg/dL 82  161  096   BUN 8 - 23 mg/dL 31  41  38   Creatinine 0.61 - 1.24 mg/dL 0.45  4.09  8.11   Sodium 135 - 145 mmol/L 135  138  137   Potassium 3.5 - 5.1 mmol/L 3.9  4.3  4.3   Chloride 98 - 111 mmol/L 95  97  99   CO2 22 - 32 mmol/L 32  32  32   Calcium 8.9 - 10.3 mg/dL 8.7  8.9  8.7    BUN improving despite lasix addition    14: CAD: continue aspirin, Plavix, statin             -follows with Dr. Tresa Endo  -Denies chest pain   15: Carotid stenosis: follow-up outpatient  16. Hypoalbuminemia: encouraged high protein diet. Monitor CMP weekly.   17. Hyperglycemia: Improved, discussed elevated blood sugars and recommended avoiding added sugars in diet  -6/18 glucose 82 on BMP yesterday - controlled   18. Lower extremity swelling: Increase lasix to 20mg  daily  19. Hypotension: change metoprolol to Toprol XL 25mg  HS  -6/18 well-controlled continue current regimen Vitals:   12/06/22 1955 12/07/22 0302  BP: 113/65 138/68  Pulse: 73 64  Resp: 18 18  Temp: 98.1 F (36.7 C) 97.8 F (36.6 C)  SpO2: 98% 98%         LOS: 7 days A FACE TO FACE EVALUATION WAS PERFORMED  Fanny Dance 12/07/2022, 8:25 AM

## 2022-12-07 NOTE — Progress Notes (Signed)
Occupational Therapy Session Note  Patient Details  Name: Maurice Munoz MRN: [id number] Date of Birth: April 22, 1946  Today's Date: 12/07/2022 OT Individual Time: 4098-1191 & 4782-9562 OT Individual Time Calculation (min): 55 min & 72 min   Short Term Goals: Week 1:  OT Short Term Goal 1 (Week 1): STG=LTG's d/t LOS  Skilled Therapeutic Interventions/Progress Updates:  Session 1 Skilled OT intervention completed with focus on walk in shower and toilet transfers and safety education in prep for DC, along with BUE strengthening. Pt received seated in w/c, agreeable to session. Unrated pain reported in R knee; declined medication when offered to check in with nursing. OT offered rest breaks, repositioning throughout for pain reduction.  Offered pt a shower, however pt deferred to PM session after education on wound coverage and muscle relaxation and endurance benefit.   Discussed home set up with current walk in shower with sliding door and STE. Has grab bars, built in bench. Originally would seldomly shower due to lack of energy/no supplemental O2 against MD advice. Pt completed CGA sit > stand using RW, then ambulated with CGA to shower in room. Mod cues needed for direction and hand placement however able to side step in with close supervision to TTB. Discussed how door frame will require pt to step over threshold. After thorough discussion, pt expressed that he thinks full showers will be too much for him independently therefore plans to sponge bathe when at home. Able to exit with prior assist and ambulate with CGA to [facility] over toilet.  Advised that pt get BSC for purpose of night time toileting or pushing up to stand due to pt's reliance on UE support to power up. Initially pt declined the need stating "the toilet is high." Had pt trial standing without arm rests, but grab bar on LUE to simulate counter but not to pull, and use RW. Required CGA, and it was slow/painful stand per pt report. Had  pt trial again with use of arm rest on BSC, then pt able to stand with RW with supervision and less pain. Pt then agreeable for [facility] over toilet stating "you are one smart person." CSW notified of DME need.  Ambulated with CGA using RW to EOB with pt requesting to lay down, however agreeable to exercises prior to return to bed. Completed the following exercises to promote strength needed for BADL independence: (With yellow theraband) 12 reps Horizontal abduction Self-anchored shoulder flexion each arm Self-anchored bicep flexion each arm Self-anchored tricep extension each arm Shoulder external rotation Shoulder diagonal pulls  Transitioned to supine in bed with mod I. Pt remained semi upright in bed, with bed alarm on/activated, and with all needs in reach at end of session.  Vitals -Received on 2L via [place], SPO2 97% -96% after mobility; remained on 2L   Session 2 Skilled OT intervention completed with focus on ADL retraining and activity tolerance at the shower level. Pt received upright in bed, agreeable to session. R knee pain reported; med declined, however agreeable to shower for muscle relaxation. OT also offered rest breaks, repositioning throughout for pain reduction.  Transitioned to EOB with mod I. Completed CGA sit > stand using RW then CGA ambulatory transfer to TTB in shower with cues for positioning however less than dry run in previous session. Able to doff all clothing with min A for BLE. Tegaderm/gauze applied to R hand prior to shower per MD order, however other wounds left covered for shower and plan to change after shower.  Pt was able to bathe all parts with close supervision at the sit > stand level using grab bar for balance. Increased SOB with activity however remained on 2L for duration of session. Discussed energy conservation strategies such as luke warm water for showers vs hot with steam. Pt urgently reported need for BM, with CGA for safety and wet floor, pt  completed sit > stand and ambulatory transfer to [facility] over toilet. Continent of void/BM; charted. Pt utilized "flossing method" for pericare which he did at baseline, with supervision in stance with RW.  Able to thread belt into loop, then BLE into underwear/shorts with supervision. Supervision sit > stand and donning over hips, then CGA ambulatory transfer to EOB. Switched out current [place] for O2 out for new cord due to wet pads on the previous one from shower. OT initiated wound care dressing change to bilateral knees per nursing order with xeroform/mepilex, however direct handoff to nursing due to OT time constraint. All needs met at end of session.  Vitals -Received on 2L via [place], SPO2 97% -SPO2 93%, HR 130 bpm on 2L with activity during shower -Remained on 2L per MD order at end of session with SPO2 at 98% and HR < 100 bpm    Therapy Documentation Precautions:  Precautions Precautions: Fall Precaution Comments: watch O2 and BP Restrictions Weight Bearing Restrictions: No    Therapy/Group: Individual Therapy  Maurice Novas, MS, OTR/L  12/07/2022, 2:25 PM

## 2022-12-08 DIAGNOSIS — G8929 Other chronic pain: Secondary | ICD-10-CM

## 2022-12-08 DIAGNOSIS — M25561 Pain in right knee: Secondary | ICD-10-CM

## 2022-12-08 NOTE — Evaluation (Signed)
Recreational Therapy Assessment and Plan  Patient Details  Name: Maurice Munoz MRN: [id number] Date of Birth: [birth year] Today's Date: 12/08/2022  Rehab Potential:  Good ELOS:   d/c 6/22  Assessment   Hospital Problem: Principal Problem:   Debility Active Problems:   Respiratory failure (HCC)     Past Medical History:      Past Medical History:  Diagnosis Date   Adrenal adenoma, left      small   Aortic atherosclerosis (HCC)     Back pain     Bilateral carotid artery stenosis followed by dr Tresa Endo    per last carotid duplex 07-17-2014  -- proximal LICA 50-69% ,  RICA 0-49%   CAD (coronary artery disease)      a. s/p prior intervention to RCA and PDA in 1999 b. DES to LAD and distal RCA in 2007   Cardiomegaly      Stable   Chronic constipation     Chronic pain syndrome     CKD (chronic kidney disease), stage III (HCC)     DDD (degenerative disc disease), cervical     DDD (degenerative disc disease), lumbosacral     Dyspnea     Dyspnea on exertion     Gout      per pt last inflared episode 2015 approx.   Grade II diastolic dysfunction     H/O epistaxis      per pt sees ENT dr Suszanne Conners for cauterization (pt takes plavix)   History of acute pancreatitis 08/08/2014   History of kidney stones     HTN (hypertension)     Hyperlipidemia     Interstitial lung disease (HCC)     Left arm pain      s/p fall   Nocturia     Numbness of right foot      due to DDD lumbar   Peripheral edema     Pilonidal cyst     Pleural effusion on right     Pulmonary fibrosis (HCC)      followed by pcp-- last chest CT 04-08-2016   S/P drug eluting coronary stent placement 10/11/2005    mLAD and dRCA   Wears dentures      upper   Wears glasses      Past Surgical History:       Past Surgical History:  Procedure Laterality Date   ANTERIOR CERVICAL DECOMP/DISCECTOMY FUSION   12-25-2001     dr Terrilee Files [facility]    C3 -- C7   ANTERIOR CERVICAL DECOMP/DISCECTOMY FUSION   12-11-2012   dr Channing Mutters   [facility])   CARDIOVASCULAR STRESS TEST   08-23-2012    dr Tresa Endo    normal lexiscan nuclear study w/ no ischemia/  normal LV function and wall motion, ef 65%   CARDIOVASCULAR STRESS TEST       CORONARY ANGIOPLASTY   1999    PTCA to RCA & PDA   CORONARY ANGIOPLASTY WITH STENT PLACEMENT   10-11-2005  dr [name]    PCI and stenting to midLAD & dRCA (Cypher DES x2)   CORONARY BALLOON ANGIOPLASTY N/A 04/14/2018    Procedure: CORONARY BALLOON ANGIOPLASTY;  Surgeon: Lennette Bihari, MD;  Location: [facility] INVASIVE CV LAB;  Service: Cardiovascular;  Laterality: N/A;   CORONARY STENT INTERVENTION N/A 04/14/2018    Procedure: CORONARY STENT INTERVENTION;  Surgeon: Lennette Bihari, MD;  Location: [facility] INVAS[facility] CV [facility];  Service: Cardiovascular;  Laterality: N/A;   CYSTOSCOPY W/ URETERAL  STENT PLACEMENT Left 02-11-2010     [facility]   LEFT HEART CATH N/A 04/14/2018    Procedure: Left Heart Cath;  Surgeon: Lennette Bihari, MD;  Location: [facility] INVASIVE CV LAB;  Service: Cardiovascular;  Laterality: N/A;   LEFT HEART CATH AND CORONARY ANGIOGRAPHY N/A 04/12/2018    Procedure: LEFT HEART CATH AND CORONARY ANGIOGRAPHY;  Surgeon: Corky Crafts, MD;  Location: [facility] INVASIVE CV LAB;  Service: Cardiovascular;  Laterality: N/A;   LUMBAR LAMINECTOMY   2015    L5 -- S1   PILONIDAL CYST EXCISION   1980s   PILONIDAL CYST EXCISION N/A 03/31/2017    Procedure: EXCISION OF PILONIDAL DISEASE with Flap Rotation;  Surgeon: Romie Levee, MD;  Location: [facility];  Service: General;  Laterality: N/A;   RIGHT/LEFT HEART CATH AND CORONARY ANGIOGRAPHY N/A 10/18/2022    Procedure: RIGHT/LEFT HEART CATH AND CORONARY ANGIOGRAPHY;  Surgeon: Lennette Bihari, MD;  Location: [facility] CV [facility];  Service: Cardiovascular;  Laterality: N/A;   THROAT SURGERY   1996    per pt "removal piece of cryloid(?) that was pressing against throat"  states no issues since   TRANSTHORACIC ECHOCARDIOGRAM   08-24-2010  dr Tresa Endo     ef 50-55%/  mild MR/  trivial TR   WOUND DEBRIDEMENT N/A 10/06/2017    Procedure: DEBRIDEMENT WOUND PLACEMENT OF WOUND HEALING MATRIX;  Surgeon: Romie Levee, MD;  Location: [facility];  Service: General;  Laterality: N/A;      Assessment & Plan Clinical Impression:    Maurice Munoz is a 77 year old male with a past medical history of interstitial lung disease dating back to at least 2016 who presented to the emergency department at [facility] on 11/26/2022 with recurrent syncope and worsening dyspnea on exertion.  The patient reported that he did not check his oxygen frequently at home and was not wearing his oxygen as prescribed or recommended.  As part of his workup he underwent CTA of head and neck with evidence of incidental complete right ICA occlusion.  CTA with PE protocol was negative for PE but did demonstrate severe IPF and honeycombing, interlobar septal thickening and diffuse central groundglass opacities with bilateral pleural effusions.  Felt to be concerning for pulmonary edema and cardiogenic volume overload.  He was transferred from [facility] to [facility] for further evaluation.  MRI brain non-acute. Pulmonology service was consulted.  BNP was 2,446.  Aggressive diuresis of at least 1 L daily was recommended with close monitoring of blood pressure and kidney function.  O2 saturation goal of 88%'s weaning as tolerated.  He was started on IV Solu-Medrol for 3 days and will continue with prednisone taper.  TTE was updated. EF ~55-60%.  He is tolerating a heart healthy diet.  BUN today elevated at 38 creatinine normal at 1.15.  Urine potassium and magnesium have been normal.  BNP 1098.  Cytosis continues to improve.  Mild thrombocytopenia.  Oxygen saturation levels have been normal on 3 L oxygen via nasal cannula. The patient requires inpatient medicine and rehabilitation evaluations and services for ongoing dysfunction secondary to acute on chronic hypoxemic respiratory  failure.   Recall history significant for PCI x 5 and demonstrated nonobstructive CAD.  Demonstration of pulmonary hypertension with PVR of 6.89, LVEDP of 15.  He was prescribed Lasix.  He follows with Dr. Tresa Endo.   Has had multiple spine surgeries, non-healing pilonidal cyst with chronic pain. No tobacco or alcohol use. Lives alone and has  friends who provide support as well as several step-children.    Met with pt today to discuss TR services including leisure education, activity analysis/modifications and stress management.  Also discussed the importance of social, emotional, spiritual health in addition to physical health and their effects on overall health and wellness.  Pt stated understanding.  Pt reports multiple leisure & community interests, including church attendance & involvement but acknowledges health limitations that have effected his ability to participate in them for quite some time.  Pt shares modifications he has made in order to remain involved in activities of interest.  Pt presents with decreased activity tolerance, decreased oxygen support, decreased functional mobility, decreased balance and difficulty maintaining precautions Limiting pt's independence with leisure/community pursuits.  Plan  Min 1 TR session >20 minutes during LOS  Recommendations for other services: None   Discharge Criteria: Patient will be discharged from TR if patient refuses treatment 3 consecutive times without medical reason.  If treatment goals not met, if there is a change in medical status, if patient makes no progress towards goals or if patient is discharged from hospital.  The above assessment, treatment plan, treatment alternatives and goals were discussed and mutually agreed upon: by patient  Collyn Selk 12/08/2022, 11:45 AM

## 2022-12-08 NOTE — Progress Notes (Signed)
Physical Therapy Session Note  Patient Details  Name: Maurice Munoz MRN: [id number] Date of Birth: 1946/03/29  Today's Date: 12/08/2022 PT Individual Time: 4098-1191 + 1100-1125 + 1500 - 1525 PT Individual Time Calculation (min): 55 min + 25 min + 25 min  Short Term Goals: Week 1:  PT Short Term Goal 1 (Week 1): STG = LTG 2/2 LOS  Skilled Therapeutic Interventions/Progress Updates:     Session 1: Chart reviewed and pt agreeable to therapy. Pt received seated in with 5/10 c/o pain in R knee and on 2L O2 via [place]. Session focused on planning for O2 management in the home and amb endurance to promote safe home access. Pt initiated session with discussion of O2 management. Pt reports having large tank of O2 with 24ft cord that reaches bedrooms, bathroom, and kitchen. Pt confirmed he can reach longer distances in house with scooter and portable tank if needed. Pt then completed amb of 138ft using S + RW. Pt then completed blocked practice of amb of 40ft with O2 cord management. Pt demonstrated ability to manage cord safely with S + RW. Pt then completed blocked practice of 173ft amb with S + RW and good awareness of need to rest between rounds. At end of session, pt was left seated in [facility] with alarm engaged, nurse call bell and all needs in reach.  Session 2: Chart reviewed and pt agreeable to therapy. Pt received seated in WC with same c/o pain and on 2L O2 via [place]. Session focused on car transfer for community access. Pt taken to therapy gym for time management. Pt then completed 2 car transfers to driver side with step for high truck using S + RW. Session education emphasized need to follow up on driving recommendations with primary team. At end of session, pt was left seated in [facility] with alarm engaged, nurse call bell and all needs in reach.   Session 3: Chart reviewed and pt agreeable to therapy. Pt received seated in WC with same c/o pain and on 2L O2 via [place]. Session focused on activity tolerance  with amb and strength training to promote activity tolerance for home and community access. Pt initiated session with amb of 128ft + 165ft using S + RW. Pt noted to have increased velocity compared to prior session. Pt then completed 5x sit to stand in 35 sec with SpO2 at 99% after exercise. Session education emphasized continued exercise in the home after d/c to maintain activity tolerance. At end of session, pt was left semi-reclined in bed with alarm engaged, nurse call bell and all needs in reach.    Therapy Documentation Precautions:  Precautions Precautions: Fall Precaution Comments: watch O2 and BP Restrictions Weight Bearing Restrictions: No General:      Therapy/Group: Individual Therapy  Dionne Milo, PT, DPT 12/08/2022, 10:57 AM

## 2022-12-08 NOTE — Progress Notes (Signed)
PROGRESS NOTE   Subjective/Complaints: No new concerns this AM. Reports breathing is doing well. Reports pain is improving- not using PRNs regularly.   ROS: +knee pain-chronic, + lower back pain-controlled,  +lower extremity swelling-improving Denies shortness of breath,  chest pain, abdominal pain, nausea, vomiting, constipation or diarrhea, vision changes, changes in motor or sensory function  Objective:   No results found. Recent Labs    12/06/22 0554  WBC 12.5*  HGB 14.2  HCT 43.5  PLT 216    Recent Labs    12/06/22 0554  NA 135  K 3.9  CL 95*  CO2 32  GLUCOSE 82  BUN 31*  CREATININE 1.22  CALCIUM 8.7*     Intake/Output Summary (Last 24 hours) at 12/08/2022 1122 Last data filed at 12/08/2022 0738 Gross per 24 hour  Intake 180 ml  Output 1450 ml  Net -1270 ml         Physical Exam: Vital Signs Blood pressure 131/62, pulse 61, temperature 98.8 F (37.1 C), resp. rate 16, height 6\' 1"  (1.854 m), weight 79.6 kg, SpO2 100 %.  General: No acute distress Head and neck: Abrasion on forehead and nose-gradually improving Mood and affect are appropriate, pleasant and jovial  Heart: RRR Lungs: Clear to auscultation, breathing unlabored, no rales or wheezes, nasal cannula in place 2L O2  Abdomen: Positive bowel sounds, soft nontender to palpation, nondistended Extremities: B/L LE edema improving     Musculoskeletal:   Comments: Pt demonstrated pain in the right patella and patellar tendon with palpation and with bending near 90 degrees.   Skin:    Comments: Head and nose wounds as above. Large wound at right wrist with dry dressing intact. Smaller left wrist wound. Superficial quarter sized wounds on each inferior patella area. Bruising left lateral foot.   Neurological:     Comments: Alert and oriented x 3.  Normal language and speech. Cranial nerve exam unremarkable. MMT: 4/5 BUE with limitations  distally due to dressings/wrist wounds. BLE 3+/5 HF, KE, 4/5 ADF/PF. No gross sensory deficits appreciated. Normal tone.      Assessment/Plan: 1. Functional deficits which require 3+ hours per day of interdisciplinary therapy in a comprehensive inpatient rehab setting. Physiatrist is providing close team supervision and 24 hour management of active medical problems listed below. Physiatrist and rehab team continue to assess barriers to discharge/monitor patient progress toward functional and medical goals  Care Tool:  Bathing    Body parts bathed by patient: Right arm, Left arm, Chest, Abdomen, Front perineal area, Buttocks, Right upper leg, Left upper leg, Right lower leg, Left lower leg, Face   Body parts bathed by helper: Right lower leg, Left lower leg     Bathing assist Assist Level: Supervision/Verbal cueing     Upper Body Dressing/Undressing Upper body dressing   What is the patient wearing?: Button up shirt    Upper body assist Assist Level: Set up assist    Lower Body Dressing/Undressing Lower body dressing      What is the patient wearing?: Underwear/pull up, Pants     Lower body assist Assist for lower body dressing: Supervision/Verbal cueing     Toileting Toileting  Toileting assist Assist for toileting: Supervision/Verbal cueing     Transfers Chair/bed transfer  Transfers assist     Chair/bed transfer assist level: Minimal Assistance - Patient > 75%     Locomotion Ambulation   Ambulation assist      Assist level: Minimal Assistance - Patient > 75% Assistive device: Walker-rolling Max distance: 35   Walk 10 feet activity   Assist     Assist level: Minimal Assistance - Patient > 75% Assistive device: Walker-rolling   Walk 50 feet activity   Assist    Assist level: Minimal Assistance - Patient > 75% Assistive device: Walker-standard    Walk 150 feet activity   Assist Walk 150 feet activity did not occur: Safety/medical  concerns (2/2 pain)         Walk 10 feet on uneven surface  activity   Assist     Assist level: Minimal Assistance - Patient > 75% Assistive device: Walker-standard   Wheelchair     Assist Is the patient using a wheelchair?: Yes Type of Wheelchair: Manual Wheelchair activity did not occur: Safety/medical concerns (2/2 pain)         Wheelchair 50 feet with 2 turns activity    Assist    Wheelchair 50 feet with 2 turns activity did not occur: Safety/medical concerns (2/2 pain)       Wheelchair 150 feet activity     Assist  Wheelchair 150 feet activity did not occur: Safety/medical concerns (2/2 pain)       Blood pressure 131/62, pulse 61, temperature 98.8 F (37.1 C), resp. rate 16, height 6\' 1"  (1.854 m), weight 79.6 kg, SpO2 100 %.  Medical Problem List and Plan: 1. Functional deficits secondary to significant disability related to acute respiratory failure.              -patient may shower (cover right wrist )             -ELOS/Goals: 10-12 days with mod I goals  -Continue CIR PT/OT  -Team conference today please see physician documentation under team conference tab, met with team  to discuss problems,progress, and goals. Formulized individual treatment plan based on medical history, underlying problem and comorbidities.     2.  Antithrombotics: -DVT/anticoagulation:  Pharmaceutical: Heparin             -antiplatelet therapy: Aspirin 81 mg daily, Plavix   3. Right knee pain: Tylenol scheduled. XR ordered. Tramadol ordered prn. Norco increase to q4H prn. XR results show OA and enethesophyts, discussed with patient             -Zanaflex 4 mg q HS             -right knee pain after recent fall. May have contused patella or strained patella tendon -pt reports pain is beginning to slightly improve -will check knee XR to rule out fx-degenerative changes noted -may use ice as needed -6/15 PDMP review indicates he was on Percocet 10 at home, has chronic  back pain,  continue current regimen for now and monitor  -6/19 Pain is controlled, not using PRN medications    4. Mood/Behavior/Sleep: LCSW to evaluate and provide emotional support             -antipsychotic agents: n/a   5. Neuropsych/cognition: This patient is capable of making decisions on his own behalf.   6. Skin/Wound Care: Routine skin care checks             -monitor multiple skin  tears             -local wound care to each area.              -pad contact areas as needed for protection   7. Fluids/Electrolytes/Nutrition: Strict Is and Os and follow-up chemistries             -continue vitamin D, B12             -Heart Healthy strict 1.5 L fluid restriction per day    8: Hypertension: monitor TID and prn (?? home amlodipine and lisinopril held)             -continue Imdur 30 mh q AM (?? home 60 mg AM, 30 mg PM)             -continue Lopressor 25 mg BID (?? 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 I  messaged rehab pharmacist with regards to prednisone orders. (?? home prednisone 20 mg q AM) -continue pirfenidone -will need to establish new baseline oxygen requirement at rest and with exertion. Observe with therapy.  -6/16 he reports his breathing is doing better, appears stable on 2 L nasal cannula -6/19 gets SOB with activity but overall he feels this is improving, provide education regarding use of  02   12: Pulmonary  hypertension/right heart failure: (?? home torsemide held)               -currently no diuretics (states torsemide gives him hand cramps)  Started Lasix 20mg  daily  -6/17 weight increased a little from yesterday, stable from several days prior.  Monitor for fluid overload.  Continue Lasix and consider increase dose if weight continues to increase  -6/19Wts stable , continue lasix 20mg   Filed Weights   12/05/22 0500 12/07/22 0500 12/08/22 0500  Weight: 79.2 kg 80.3 kg 79.6 kg      13: AKI atop CKD stage IIIb: creatinine reviewed, resolved      Latest Ref Rng & Units 12/06/2022    5:54 AM 12/01/2022    5:48 AM 11/30/2022    4:04 AM  BMP  Glucose 70 - 99 mg/dL 82  161  096   BUN 8 - 23 mg/dL 31  41  38   Creatinine 0.61 - 1.24 mg/dL 0.45  4.09  8.11   Sodium 135 - 145 mmol/L 135  138  137   Potassium 3.5 - 5.1 mmol/L 3.9  4.3  4.3   Chloride 98 - 111 mmol/L 95  97  99   CO2 22 - 32 mmol/L 32  32  32   Calcium 8.9 - 10.3  mg/dL 8.7  8.9  8.7    BUN improving despite lasix addition    14: CAD: continue aspirin, Plavix, statin             -follows with Dr. Tresa Endo  -Denies chest pain   15: Carotid stenosis: follow-up outpatient  16. Hypoalbuminemia: encouraged high protein diet. Monitor CMP weekly.   17. Hyperglycemia: Improved, discussed elevated blood sugars and recommended avoiding added sugars in diet  -6/18 glucose 82 on BMP yesterday - controlled   18. Lower extremity swelling: Increase lasix to 20mg  daily  19. Hypotension: change metoprolol to Toprol XL 25mg  HS  -6/19 well controlled, continue toprol xl Vitals:   12/07/22 2020 12/08/22 0414  BP: 135/62 131/62  Pulse: 82 61  Resp: 20 16  Temp: (!) 97.4 F (36.3 C) 98.8 F (37.1 C)  SpO2: 98% 100%         LOS: 8 days A FACE TO FACE EVALUATION WAS PERFORMED  Fanny Dance 12/08/2022, 11:22 AM

## 2022-12-08 NOTE — Progress Notes (Signed)
Patient ID: Maurice Munoz, male   DOB: Jun 11, 1946, 77 y.o.   MRN: [id number]  [facility] and RW ordered through [employer]

## 2022-12-08 NOTE — Plan of Care (Signed)
  Problem: RH Bathing Goal: LTG Patient will bathe all body parts with assist levels (OT) Description: LTG: Patient will bathe all body parts with assist levels (OT) Flowsheets (Taken 12/08/2022 1434) LTG: Pt will perform bathing with assistance level/cueing: (downgraded due to more available supervision at DC as well as balance and cardiorespiratory endurance deficits) Supervision/Verbal cueing   Problem: RH Simple Meal Prep Goal: LTG Patient will perform simple meal prep w/assist (OT) Description: LTG: Patient will perform simple meal prep with assistance, with/without cues (OT). Flowsheets (Taken 12/08/2022 1434) LTG: Pt will perform simple meal prep with assistance level of: (downgraded due to more available supervision at DC as well as balance and cardiorespiratory endurance deficits) Supervision/Verbal cueing   Problem: RH Toilet Transfers Goal: LTG Patient will perform toilet transfers w/assist (OT) Description: LTG: Patient will perform toilet transfers with assist, with/without cues using equipment (OT) Flowsheets (Taken 12/08/2022 1434) LTG: Pt will perform toilet transfers with assistance level of: (downgraded due to more available supervision at DC as well as balance and cardiorespiratory endurance deficits) Supervision/Verbal cueing   Problem: RH Tub/Shower Transfers Goal: LTG Patient will perform tub/shower transfers w/assist (OT) Description: LTG: Patient will perform tub/shower transfers with assist, with/without cues using equipment (OT) Flowsheets (Taken 12/08/2022 1434) LTG: Pt will perform tub/shower stall transfers with assistance level of: (downgraded due to more available supervision at DC as well as balance and cardiorespiratory endurance deficits) Supervision/Verbal cueing

## 2022-12-08 NOTE — Progress Notes (Signed)
Occupational Therapy Session Note  Patient Details  Name: [name] MRN: [id number] Date of Birth: Apr 05, 1946  Today's Date: 12/08/2022 OT Individual Time: 8657-8469 & 1350-1445 OT Individual Time Calculation (min): 45 min & 55 min   Short Term Goals: Week 1:  OT Short Term Goal 1 (Week 1): STG=LTG's d/t LOS  Skilled Therapeutic Interventions/Progress Updates:  Session 1 Skilled OT intervention completed with focus on ADL retraining, ambulatory endurance. Pt received upright in bed, agreeable to session. R knee pain reported however better than yesterday; pt declined meds. OT offered rest breaks, repositioning throughout for pain reduction.  Transitioned to EOB with mod I, donned shirt with set up A including buttons, threaded pants and stood from slightly elevated bed with RW with supervision to donn over hips. Set up A of donning shoes. Ambulated with supervision using RW to sink for hair grooming.   Transported dependently in w/c > gym. Pt ambulated about 150 ft using RW with supervision however with talkative nature therefore did desat (see below) requiring extended rest. On 2nd trial of 150 ft back to room, pt with better O2 with pursed lip breathing during trial.   Pt remained seated in w/c, with belt alarm on/activated, and with all needs in reach at end of session.  Vitals -Received on 2L via [place], SPO2 98% -77% after first trial of ambulation, able to rebound with extended rest to 93%, though noted elevated HR at 135 bpm; MD notified -96% with 2nd trial of ambulation with pursed lip breathing incorporated, HR at 123 bpm  Session 2 Skilled OT intervention completed with focus on dynamic balance, functional transfers and endurance. Pt received seated in w/c, agreeable to session. R knee pain reported however declined pain meds. OT offered rest breaks, repositioning throughout for pain reduction.  Transported dependently in w/c <> gym for energy conservation.  Completed all  sit > stands and stand pivot using RW with supervision during session.   Stand pivot to EOM. Pt then participated in 4 rounds (per request/enjoyment) of cornhole in stance with supervision for balance with RW. Pt did need extended seated rest breaks for O2 sat. Discussed plan to have pt initiate checking his own O2 for DC prep.   Did discuss with pt his desire of driving at DC. Encouraged pt to hold off on driving until cleared by MD as his current focus needs to be on health and mobility safety, not operating a vehicle however did review ways he can engage in desired occupations at a non driving level.  Back in room, pt remained seated in w/c, with belt alarm on/activated, and with all needs in reach at end of session.  Vitals -Received on 2L via [place], SPO2 98% -95% sat after activity   Therapy Documentation Precautions:  Precautions Precautions: Fall Precaution Comments: watch O2 and BP Restrictions Weight Bearing Restrictions: No    Therapy/Group: Individual Therapy  Melvyn Novas, MS, OTR/L  12/08/2022, 3:56 PM

## 2022-12-08 NOTE — Progress Notes (Signed)
Patient ID: Maurice Munoz, male   DOB: [birth year], 77 y.o.   MRN: [id number]  Team Conference Report to Patient/Family  Team Conference discussion was reviewed with the patient and caregiver, including goals, any changes in plan of care and target discharge date.  Patient and caregiver express understanding and are in agreement.  The patient has a target discharge date of 12/11/22.  Sw met with patient and spoke with granddaughter, [name] at bedside. SW provided team conference updates. Pt and family agreeable of d/c on Saturday.. Patient will require a RW and BSC.  Family having ramp build at the home. HH for follow up.  No additional questions or concerns.  Granddaughter will attend family education Friday 6/21 1-3 PM.   Andria Rhein 12/08/2022, 2:14 PM

## 2022-12-09 DIAGNOSIS — N179 Acute kidney failure, unspecified: Secondary | ICD-10-CM

## 2022-12-09 NOTE — Progress Notes (Signed)
Physical Therapy Session Note  Patient Details  Name: Maurice Munoz MRN: [id number] Date of Birth: April 25, 1946  Today's Date: 12/09/2022 PT Individual Time: 1020-1100 PT Individual Time Calculation (min): 40 min   Short Term Goals: Week 1:  PT Short Term Goal 1 (Week 1): STG = LTG 2/2 LOS  Skilled Therapeutic Interventions/Progress Updates:     Pt received seated in [facility] and agrees to therapy. Reports slight pain in R knee, but says is is much improved from previous sessions. PT provides rest breaks as needed to manage pain. WC transport to gym for time management. Pt performs sit to stand with RW and cues for initiation. Pt ambulates x175' with RW and CGA, with cues for upright gaze to improve posture and balance, and decreasing WB through RW for energy conservation and body mechanics. Pt takes extended seated rest break. Pt ambulates additional 3x175' with RW and same cues. Seated rest breaks between bouts with cues for pursed lip breathing. WC transport back to room. Left supine in bed with alarm intact and all needs within reach.   Therapy Documentation Precautions:  Precautions Precautions: Fall Precaution Comments: watch O2 and BP Restrictions Weight Bearing Restrictions: No  Therapy/Group: Individual Therapy  Beau Fanny, PT, DPT 12/09/2022, 4:59 PM

## 2022-12-09 NOTE — Group Note (Signed)
Patient Details Name: Maurice Munoz MRN: [id number] DOB: Feb 11, 1946 Today's Date: 12/09/2022  Time Calculation: OT Group Time Calculation OT Group Start Time: 1430 OT Group Stop Time: 1530 OT Group Time Calculation (min): 60 min      Group Description: Dance Group: Pt participated in dance group with an emphasis on social interaction, motor planning, increasing overall activity tolerance and bimanual tasks. All songs were selected by group members. Dance moves included AROM of BUE/BLE gross motor movements with an emphasis on building functional endurance.    Individual level documentation: Patient completed group from sitting level. Patientt needed supervision to complete various dance moves with OT providing visual model of dance movements and cues to initiate rest breaks. Patient needed min modifications during group.  Pain:  0/10  Precautions:  Falls, monitor O2  Army Fossa 12/09/2022, 4:23 PM

## 2022-12-09 NOTE — Progress Notes (Signed)
PROGRESS NOTE   Subjective/Complaints: No events overnight, no new concerns reported.  He reports his right knee pain is improving.  Reports his last bowel movement was 2 days ago denies feeling constipated.  ROS: +knee pain-improving, + lower back pain-controlled,  +lower extremity swelling-improving Denies,  chest pain, abdominal pain, nausea, vomiting, constipation or diarrhea, vision changes, HA, changes in motor or sensory function Chronic shortness of breath-improved,  Objective:   No results found. No results for input(s): "WBC", "HGB", "HCT", "PLT" in the last 72 hours.  No results for input(s): "NA", "K", "CL", "CO2", "GLUCOSE", "BUN", "CREATININE", "CALCIUM" in the last 72 hours.   Intake/Output Summary (Last 24 hours) at 12/09/2022 0828 Last data filed at 12/09/2022 0658 Gross per 24 hour  Intake 360 ml  Output 1625 ml  Net -1265 ml         Physical Exam: Vital Signs Blood pressure 131/61, pulse (!) 59, temperature (!) 97.5 F (36.4 C), resp. rate 16, height 6\' 1"  (1.854 m), weight 76.2 kg, SpO2 100 %.  General: No acute distress Head and neck: Abrasion on forehead and nose-gradually improving Mood and affect are appropriate, pleasant and jovial  Heart: RRR Lungs: Clear to auscultation, breathing unlabored, no rales or wheezes, air movement, nasal cannula in place 2L O2  Abdomen: Positive bowel sounds-normal active, soft nontender to palpation, nondistended Extremities: B/L LE edema-mild    Musculoskeletal:   Comments: Pt demonstrated pain in the right patella and patellar tendon with palpation and with bending near 90 degrees.   Skin:    Comments: Head and nose wounds as above. Large wound at right wrist with dry dressing intact. Smaller left wrist wound. Superficial quarter sized wounds on each inferior patella area. Bruising left lateral foot.  Foam border dressing over bilateral anterior  knees. Neurological:     Comments: Alert and oriented x 3.  Normal language and speech. Cranial nerve exam unremarkable. MMT: 4/5 BUE with limitations distally due to dressings/wrist wounds. BLE 3+/5 HF, KE, 4/5 ADF/PF. No gross sensory deficits appreciated. Normal tone.      Assessment/Plan: 1. Functional deficits which require 3+ hours per day of interdisciplinary therapy in a comprehensive inpatient rehab setting. Physiatrist is providing close team supervision and 24 hour management of active medical problems listed below. Physiatrist and rehab team continue to assess barriers to discharge/monitor patient progress toward functional and medical goals  Care Tool:  Bathing    Body parts bathed by patient: Right arm, Left arm, Chest, Abdomen, Front perineal area, Buttocks, Right upper leg, Left upper leg, Right lower leg, Left lower leg, Face   Body parts bathed by helper: Right lower leg, Left lower leg     Bathing assist Assist Level: Supervision/Verbal cueing     Upper Body Dressing/Undressing Upper body dressing   What is the patient wearing?: Button up shirt    Upper body assist Assist Level: Set up assist    Lower Body Dressing/Undressing Lower body dressing      What is the patient wearing?: Underwear/pull up, Pants     Lower body assist Assist for lower body dressing: Supervision/Verbal cueing     Editor, commissioning  assist Assist for toileting: Supervision/Verbal cueing     Transfers Chair/bed transfer  Transfers assist     Chair/bed transfer assist level: Minimal Assistance - Patient > 75%     Locomotion Ambulation   Ambulation assist      Assist level: Minimal Assistance - Patient > 75% Assistive device: Walker-rolling Max distance: 35   Walk 10 feet activity   Assist     Assist level: Minimal Assistance - Patient > 75% Assistive device: Walker-rolling   Walk 50 feet activity   Assist    Assist level: Minimal  Assistance - Patient > 75% Assistive device: Walker-standard    Walk 150 feet activity   Assist Walk 150 feet activity did not occur: Safety/medical concerns (2/2 pain)         Walk 10 feet on uneven surface  activity   Assist     Assist level: Minimal Assistance - Patient > 75% Assistive device: Development worker, international aid Is the patient using a wheelchair?: Yes Type of Wheelchair: Manual Wheelchair activity did not occur: Safety/medical concerns (2/2 pain)         Wheelchair 50 feet with 2 turns activity    Assist    Wheelchair 50 feet with 2 turns activity did not occur: Safety/medical concerns (2/2 pain)       Wheelchair 150 feet activity     Assist  Wheelchair 150 feet activity did not occur: Safety/medical concerns (2/2 pain)       Blood pressure 131/61, pulse (!) 59, temperature (!) 97.5 F (36.4 C), resp. rate 16, height 6\' 1"  (1.854 m), weight 76.2 kg, SpO2 100 %.  Medical Problem List and Plan: 1. Functional deficits secondary to significant disability related to acute respiratory failure.              -patient may shower (cover right wrist )             -ELOS/Goals: 10-12 days with mod I goals  -Continue CIR PT/OT  -Estimated discharge 6/22    2.  Antithrombotics: -DVT/anticoagulation:  Pharmaceutical: Heparin             -antiplatelet therapy: Aspirin 81 mg daily, Plavix   3. Right knee pain: Tylenol scheduled. XR ordered. Tramadol ordered prn. Norco increase to q4H prn. XR results show OA and enethesophyts, discussed with patient             -Zanaflex 4 mg q HS             -right knee pain after recent fall. May have contused patella or strained patella tendon -pt reports pain is beginning to slightly improve -will check knee XR to rule out fx-degenerative changes noted -may use ice as needed -6/15 PDMP review indicates he was on Percocet 10 at home, has chronic back pain,  continue current regimen for now and  monitor  -6/ 20 pain created controlled, continue scheduled Tylenol for now.  Required as needed tramadol or Norco in a few days.   4. Mood/Behavior/Sleep: LCSW to evaluate and provide emotional support             -antipsychotic agents: n/a   5. Neuropsych/cognition: This patient is capable of making decisions on his own behalf.   6. Skin/Wound Care: Routine skin care checks             -monitor multiple skin tears             -local wound care to  each area.              -pad contact areas as needed for protection   7. Fluids/Electrolytes/Nutrition: Strict Is and Os and follow-up chemistries             -continue vitamin D, B12             -Heart Healthy strict 1.5 L fluid restriction per day    8: Hypertension: monitor TID and prn (?? home amlodipine and lisinopril held)             -continue Imdur 30 mh q AM (?? home 60 mg AM, 30 mg PM)             -continue Lopressor 25 mg BID (?? 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 I  messaged rehab pharmacist with regards to prednisone orders. (?? home prednisone 20 mg q AM) -continue pirfenidone -will need to establish new baseline oxygen requirement at rest and with exertion. Observe with therapy.  -6/16 he reports his breathing is doing better, appears stable on 2 L nasal cannula -6/19 gets SOB with activity but overall he feels this is improving, provide education regarding use of [facility] 02   12: Pulmonary  hypertension/right heart failure: (?? home torsemide held)               -currently no diuretics (states torsemide gives him hand cramps)  Started Lasix 20mg  daily  -6/17 weight increased a little from yesterday, stable from several days prior.  Monitor for fluid overload.  Continue Lasix and consider increase dose if weight continues to increase  -6/19Wts stable , continue lasix 20mg   -6/20 weight appears to be trending down a little-continue follow trend, recheck BMP tomorrow, consider decrease Lasix dose  Filed Weights   12/07/22 0500 12/08/22 0500 12/09/22 0743  Weight: 80.3 kg 79.6 kg 76.2 kg      13: AKI atop CKD stage IIIb: creatinine reviewed, resolved      Latest Ref Rng & Units 12/06/2022    5:54 AM 12/01/2022    5:48 AM 11/30/2022    4:04 AM  BMP  Glucose 70 - 99 mg/dL 82  161  096   BUN 8 - 23 mg/dL 31  41  38   Creatinine 0.61 - 1.24 mg/dL 0.45  4.09  8.11   Sodium 135 - 145 mmol/L 135  138  137   Potassium 3.5 - 5.1 mmol/L 3.9  4.3  4.3   Chloride 98 - 111 mmol/L 95  97  99   CO2 22 - 32 mmol/L 32  32  32   Calcium 8.9 - 10.3 mg/dL 8.7  8.9  8.7    BUN improving despite lasix addition  Recheck BMP tomorrow   14: CAD: continue aspirin, Plavix, statin             -follows with Dr. Tresa Endo  -Denies chest pain   15: Carotid stenosis: follow-up outpatient  16. Hypoalbuminemia: encouraged high protein diet. Monitor CMP weekly.   17. Hyperglycemia: Improved, discussed elevated blood sugars and recommended avoiding added sugars in diet  -6/18 glucose 82 on BMP yesterday - controlled   18. Lower extremity swelling: Increase lasix to 20mg  daily  19. Hypotension: change metoprolol to Toprol XL 25mg  HS  -6/20 controlled and stable, continue to monitor  Vitals:   12/08/22 1921 12/09/22 0553  BP: 114/65 131/61  Pulse: 77 (!) 59  Resp: 16 16  Temp: 98.1 F (36.7 C) (!) 97.5 F (36.4 C)  SpO2: 98% 100%         LOS: 9 days A FACE TO FACE EVALUATION WAS  PERFORMED  Fanny Dance 12/09/2022, 8:28 AM

## 2022-12-09 NOTE — Progress Notes (Signed)
Patient ID: Maurice Munoz, male   DOB: 1946-01-24, 77 y.o.   MRN: [id number]  [facility] referral sent to [facility].

## 2022-12-09 NOTE — Patient Care Conference (Signed)
Inpatient RehabilitationTeam Conference and Plan of Care Update Date: 12/08/2022   Time: 11:32 AM    Patient Name: Maurice Munoz      Medical Record Number: [id number]  Date of Birth: [birth year] Sex: Male         Room/Bed: 8G95A/2Z30Q-65 Payor Info: Payor: MEDICARE / Plan: MEDICARE PART A AND B / Product Type: *No Product type* /    Admit Date/Time:  11/30/2022  3:57 PM  Primary Diagnosis:  Debility  Hospital Problems: Principal Problem:   Debility Active Problems:   Respiratory failure [facility])    Expected Discharge Date: Expected Discharge Date: 12/11/22  Team Members Present: Physician leading conference: Dr. Fanny Dance Social Worker Present: Lavera Guise, BSW Nurse Present: Chana Bode, Eden Lathe, RN PT Present: Wynelle Link, Ramiro Harvest, PT OT Present: Candee Furbish, OT PPS Coordinator present : Fae Pippin, SLP     Current Status/Progress Goal Weekly Team Focus  Bowel/Bladder    Continent B/B  Remain continent of B/B  Toilet every 4 hours and PRN   Swallow/Nutrition/ Hydration               ADL's   Supervision UB dressing, Min A LB dressing, Supervision toileting. Has not preferred to shower or do much bathing out of fear with wounds. Anticipate he will need assist for wound care?   Mod I   Barriers to DC: fatigue and SOB with exertion, Plan: cardiovascular/respiratory endurance training on nustep & energy conservation education within functional context    Mobility   bed mob at mod I; trasnfers + amb 138ft at S + RW with SpO2 91% on 2L O2; note: brother put in ramp that pt reports as 29ftW x 32ftL and pt reports this is for 1 step to porch that is 8" tall, so may be able to remove step goal. Pt has 41ft O2 cord that reaches bed/bath/kitchen. Pt may need RW   ModI + LRAD  Barriers to D/C: O2 dependency, amb independence, DME safety    Communication                Safety/Cognition/ Behavioral Observations                Pain    Denies pain   Remain pain free  Assess every 4 hours, prior to therapy and PRN   Skin   Skin tears to bilateral hands, right elbow, forehead, bridge of nose, and bilateral knees. Non pressure injury to sacrum.  Wounds will continue to heal and improve.     Assess every shift and PRN      Discharge Planning:  Patient discharging home with assistance from his granddaughter and brother. 1 level home, 1 step to enter. Patient has a RW, Barrister's clerk.   Team Discussion: Debility. Addressing wound care with family to ensure that patient/caregiver can perform wound care. Nursing/therapy continue to encourage proper use of oxygen and energy conservation while performing task. Reduction of fall risk/safety management with oxygen tubing at 50 feet. Daily weight stable with low dose Lasix. Encourage home monitoring with record keeping for morning weights, B/P, HR, and O2 SAT with pulse ox. Physician/team to discuss no driving until cleared to do.   Patient on target to meet rehab goals: yes, patient will meet goals by expected discharge of 12/11/22  *See Care Plan and progress notes for long and short-term goals.   Revisions to Treatment Plan:  Monitor labs, VS, oxygen.   Teaching Needs: Medications, safety, self  care, skin/wound care, gait/transfer training, etc.  Current Barriers to Discharge: Decreased caregiver support and Wound care  Possible Resolutions to Barriers: Family education, family able to perform wound care, order recommended DME     Medical Summary Current Status: Debility, HTN, ILD/IPF, R heart failure, AKI with CKD IIIb, hyperglycemia, hypotension  Barriers to Discharge: Cardiac Complications;Hypotension;Medical stability  Barriers to Discharge Comments: Continue O2 [place],  multiple wounds/skin tears, hypotension, fatigue Possible Resolutions to [employer]: continue O2 [place], monitor BP, monitor for signs of fluid overload, continue lasix 20mg   daily   Continued Need for Acute Rehabilitation Level of Care: The patient requires daily medical management by a physician with specialized training in physical medicine and rehabilitation for the following reasons: Direction of a multidisciplinary physical rehabilitation program to maximize functional independence : Yes Medical management of patient stability for increased activity during participation in an intensive rehabilitation regime.: Yes Analysis of laboratory values and/or radiology reports with any subsequent need for medication adjustment and/or medical intervention. : Yes   I attest that I was present, lead the team conference, and concur with the assessment and plan of the team.   Jearld Adjutant 12/08/2022, 11:32AM

## 2022-12-09 NOTE — Progress Notes (Signed)
Physical Therapy Session Note  Patient Details  Name: Maurice Munoz MRN: [id number] Date of Birth: 04-Jul-1945  Today's Date: 12/09/2022 PT Individual Time: 0803-0850 PT Individual Time Calculation (min): 47 min   Short Term Goals: Week 1:  PT Short Term Goal 1 (Week 1): STG = LTG 2/2 LOS  Skilled Therapeutic Interventions/Progress Updates:      Therapy Documentation Precautions:  Precautions Precautions: Fall Precaution Comments: watch O2 and BP Restrictions Weight Bearing Restrictions: No   Pt received semi-reclined in bed and agreeable to PT session. Pt with unrated bilateral knee pain R>L and declined medication but provided with rest/repositioning for relief.   PT engaged in discussion regarding discharge planning. Pt reports no concerns with current discharge plan and that he feels comfortable with mobility. PT educated pt regarding purse lip breathing, energy conservation and checking O2 saturation at home with pulse oximeter.   Pt (S) with supine to sit and for upper/lower body dressing seated/standing edge of bed. Pt CGA with gait ~150 ft to main gym with 2 epsidoes of left knee buckling in which pt able to self recover with increased reliance of UE's on walker.   Pt performed simulated recliner transfer from noncompliant surface on mat table (airex foam) and requires (S) for transfer.   PT discussed fall recovery and the benefits of wearing life alert in case of injury and pt receptive to education.   Pt transported by w/c for time management and energy conservation to room and left seated in w/c at bedside with all needs in reach and alarm on.    Therapy/Group: Individual Therapy  Truitt Leep Truitt Leep PT, DPT  12/09/2022, 5:49 AM

## 2022-12-09 NOTE — Progress Notes (Signed)
Occupational Therapy Session Note  Patient Details  Name: MARTINEZ WATERFIELD MRN: [id number] Date of Birth: June 05, 1946  Today's Date: 12/09/2022 OT Individual Time: 0905-1000 OT Individual Time Calculation (min): 55 min    Short Term Goals: Week 1:  OT Short Term Goal 1 (Week 1): STG=LTG's d/t LOS  Skilled Therapeutic Interventions/Progress Updates:  Skilled OT intervention completed with focus on standing tolerance, dynamic balance, cognitive strategies. Pt received seated in w/c, agreeable to session. No pain reported!  Transported pt dependently <> gym.   Pt participated in the following activities in standing using RW with supervision to address standing tolerance/balance and cognitive strategies needed for BADL management: -Motor speed dot test- 1.95 sec, 3 misses; no difficulty -Bell cancellation test- 4:10 min; no misses however noted SOB with prolonged stance. Cued pt for pursed lip breathing but O2 at 97% -Trail Making A- 2:48 sec, 5 errors; same SOB however O2 at 98% Intermittent seated rest break needed for fatigue.  Discussed how errors with cognitive strategies and sequencing can relate to challenges that may occur with return to driving as operating a vehicle is a higher level cognitive ability.  Stand pivot with supervision using RW to nustep. Pt then completed 10 mins on nustep on level 7 using BLE only due to BUE soreness for cardiovascular and respiratory benefit needed for engagement in functional ambulation.  Back in room pt remained seated in w/c, with belt alarm on/activated, and with all needs in reach at end of session.  Vitals -Received on 2L via [place], SPO2 99% -Dropped to 95% sat at lowest level after all activity above while on 2L   Therapy Documentation Precautions:  Precautions Precautions: Fall Precaution Comments: watch O2 and BP Restrictions Weight Bearing Restrictions: No    Therapy/Group: Individual Therapy  Melvyn Novas, MS,  OTR/L  12/09/2022, 12:32 PM

## 2022-12-09 NOTE — Group Note (Signed)
Patient Details Name: Maurice Munoz MRN: [id number] DOB: [birth year] Today's Date: 12/09/2022 Pt will participate in therapeutic dance group for >45 min.  MET Group Description: Dance Group: Pt participated in dance group with an emphasis on social interaction, motor planning, increasing overall activity tolerance and bimanual tasks. All songs were selected by group members. Dance moves included AROM of BUE/BLE gross motor movements with an emphasis on building functional endurance.    Individual level documentation: Patient completed group from sitting level. Patientt needed supervision to complete various dance moves .  Patient modified dance moves to tolerance with Mod I.  Pain:no c/o    Precautions:falls, watch O2 and BP    [name] 12/09/2022, 3:52 PM

## 2022-12-10 ENCOUNTER — Other Ambulatory Visit (HOSPITAL_COMMUNITY): Payer: Self-pay

## 2022-12-10 ENCOUNTER — Telehealth: Payer: Self-pay | Admitting: Cardiovascular Disease

## 2022-12-10 LAB — BASIC METABOLIC PANEL
Anion gap: 9 (ref 5–15)
BUN: 34 mg/dL — ABNORMAL HIGH (ref 8–23)
CO2: 33 mmol/L — ABNORMAL HIGH (ref 22–32)
Calcium: 9.4 mg/dL (ref 8.9–10.3)
Chloride: 94 mmol/L — ABNORMAL LOW (ref 98–111)
Creatinine, Ser: 1.42 mg/dL — ABNORMAL HIGH (ref 0.61–1.24)
GFR, Estimated: 52 mL/min — ABNORMAL LOW (ref >60)
Glucose, Bld: 79 mg/dL (ref 70–99)
Potassium: 4.8 mmol/L (ref 3.5–5.1)
Sodium: 136 mmol/L (ref 135–145)

## 2022-12-10 MED ORDER — ACETAMINOPHEN 325 MG PO TABS: 650.0000 mg | ORAL_TABLET | Freq: Three times a day (TID) | ORAL | Status: DC

## 2022-12-10 MED ORDER — METOPROLOL SUCCINATE ER 25 MG PO TB24
25.0000 mg | ORAL_TABLET | Freq: Every day | ORAL | 0 refills | Status: DC
  Filled 2022-12-10: qty 30, 30d supply, fill #0

## 2022-12-10 MED ORDER — FUROSEMIDE 20 MG PO TABS: 10.0000 mg | ORAL_TABLET | Freq: Every day | ORAL | Status: DC

## 2022-12-10 MED ORDER — FUROSEMIDE 20 MG PO TABS
20.0000 mg | ORAL_TABLET | Freq: Every day | ORAL | 0 refills | Status: DC
  Filled 2022-12-10: qty 30, 30d supply, fill #0

## 2022-12-10 MED ORDER — FUROSEMIDE 20 MG PO TABS
10.0000 mg | ORAL_TABLET | Freq: Every day | ORAL | 0 refills | Status: DC
  Filled 2022-12-10: qty 30, 60d supply, fill #0

## 2022-12-10 NOTE — Progress Notes (Signed)
Patient ID: Maurice Munoz, male   DOB: 1945-08-11, 77 y.o.   MRN: [id number]  Patient has confirmed that his brother will pick him up tomorrow for discharge. Granddaughter will be present today for family education. Please review d/c instructions with granddaughter. No additional questions or concerns.

## 2022-12-10 NOTE — Progress Notes (Signed)
PROGRESS NOTE   Subjective/Complaints: No acute events overnight noted.  He reports "this is the best I have felt in a long time ".  Reports had a bowel movement this morning.  He is looking forward to going home tomorrow.  ROS: +knee pain-improving, + lower back pain-controlled,  +lower extremity swelling-improving Denies,  chest pain, abdominal pain, nausea, vomiting, constipation or diarrhea, vision changes, HA, changes in motor or sensory function, vision changes Chronic shortness of breath-improved  Objective:   No results found. No results for input(s): "WBC", "HGB", "HCT", "PLT" in the last 72 hours.  Recent Labs    12/10/22 0658  NA 136  K 4.8  CL 94*  CO2 33*  GLUCOSE 79  BUN 34*  CREATININE 1.42*  CALCIUM 9.4     Intake/Output Summary (Last 24 hours) at 12/10/2022 0824 Last data filed at 12/10/2022 0742 Gross per 24 hour  Intake 340 ml  Output 900 ml  Net -560 ml         Physical Exam: Vital Signs Blood pressure 131/77, pulse (!) 58, temperature 98.6 F (37 C), resp. rate 15, height 6\' 1"  (1.854 m), weight 74.8 kg, SpO2 100 %.  General: No acute distress, appears comfortable sitting in chair Head and neck: Abrasion on forehead and nose-gradually improving Mood and affect are appropriate, pleasant and jovial  Heart: RRR Lungs: Clear to auscultation, breathing unlabored, no rales or wheezes, air movement, nasal cannula in place 2L O2  Abdomen: Positive bowel sounds-normal active, soft nontender to palpation, nondistended Extremities: B/L LE edema-improved    Musculoskeletal:   Comments: Pt demonstrated pain in the right patella and patellar tendon with palpation and with bending near 90 degrees- Skin:    Comments: Head and nose wounds as above. Large wound at right wrist with dry dressing intact. Smaller left wrist wound. Superficial quarter sized wounds on each inferior patella area-covered with  Mepilex dressing. Bruising left lateral foot.   Neurological:     Comments: Alert and oriented x 3.  Normal language and speech. Cranial nerve exam unremarkable. MMT: 4/5 BUE with limitations distally due to dressings/wrist wounds. BLE 4/5 HF, KE, 4/5 ADF/PF. No gross sensory deficits appreciated. Normal tone.      Assessment/Plan: 1. Functional deficits which require 3+ hours per day of interdisciplinary therapy in a comprehensive inpatient rehab setting. Physiatrist is providing close team supervision and 24 hour management of active medical problems listed below. Physiatrist and rehab team continue to assess barriers to discharge/monitor patient progress toward functional and medical goals  Care Tool:  Bathing    Body parts bathed by patient: Right arm, Left arm, Chest, Abdomen, Front perineal area, Buttocks, Right upper leg, Left upper leg, Right lower leg, Left lower leg, Face   Body parts bathed by helper: Right lower leg, Left lower leg     Bathing assist Assist Level: Supervision/Verbal cueing     Upper Body Dressing/Undressing Upper body dressing   What is the patient wearing?: Button up shirt    Upper body assist Assist Level: Independent with assistive device    Lower Body Dressing/Undressing Lower body dressing      What is the patient wearing?: Underwear/pull  up, Pants     Lower body assist Assist for lower body dressing: Independent with assitive device     Toileting Toileting    Toileting assist Assist for toileting: Independent with assistive device     Transfers Chair/bed transfer  Transfers assist     Chair/bed transfer assist level: Independent with assistive device Chair/bed transfer assistive device: Geologist, engineering   Ambulation assist      Assist level: Minimal Assistance - Patient > 75% Assistive device: Walker-rolling Max distance: 35   Walk 10 feet activity   Assist     Assist level: Minimal Assistance -  Patient > 75% Assistive device: Walker-rolling   Walk 50 feet activity   Assist    Assist level: Minimal Assistance - Patient > 75% Assistive device: Walker-standard    Walk 150 feet activity   Assist Walk 150 feet activity did not occur: Safety/medical concerns (2/2 pain)         Walk 10 feet on uneven surface  activity   Assist     Assist level: Minimal Assistance - Patient > 75% Assistive device: Development worker, international aid Is the patient using a wheelchair?: Yes Type of Wheelchair: Manual Wheelchair activity did not occur: Safety/medical concerns (2/2 pain)         Wheelchair 50 feet with 2 turns activity    Assist    Wheelchair 50 feet with 2 turns activity did not occur: Safety/medical concerns (2/2 pain)       Wheelchair 150 feet activity     Assist  Wheelchair 150 feet activity did not occur: Safety/medical concerns (2/2 pain)       Blood pressure 131/77, pulse (!) 58, temperature 98.6 F (37 C), resp. rate 15, height 6\' 1"  (1.854 m), weight 74.8 kg, SpO2 100 %.  Medical Problem List and Plan: 1. Functional deficits secondary to significant disability related to acute respiratory failure.              -patient may shower (cover right wrist )             -ELOS/Goals: 10-12 days with mod I goals  -Continue CIR PT/OT  -Estimated discharge 6/22  -Plan for discharge tomorrow    2.  Antithrombotics: -DVT/anticoagulation:  Pharmaceutical: Heparin             -antiplatelet therapy: Aspirin 81 mg daily, Plavix   3. Right knee pain: Tylenol scheduled. XR ordered. Tramadol ordered prn. Norco increase to q4H prn. XR results show OA and enethesophyts, discussed with patient             -Zanaflex 4 mg q HS             -right knee pain after recent fall. May have contused patella or strained patella tendon -pt reports pain is beginning to slightly improve -will check knee XR to rule out fx-degenerative changes noted -may use  ice as needed -6/15 PDMP review indicates he was on Percocet 10 at home, has chronic back pain,  continue current regimen for now and monitor  -6/ 20 pain created controlled, continue scheduled Tylenol for now.  Required as needed tramadol or Norco in a few days.  -6/21 pain well-controlled, no recent use of as needed medications which is particularly impressive given chronic use of oxycodone 10   4. Mood/Behavior/Sleep: LCSW to evaluate and provide emotional support             -antipsychotic agents: n/a  5. Neuropsych/cognition: This patient is capable of making decisions on his own behalf.   6. Skin/Wound Care: Routine skin care checks             -monitor multiple skin tears             -local wound care to each area.              -pad contact areas as needed for protection   7. Fluids/Electrolytes/Nutrition: Strict Is and Os and follow-up chemistries             -continue vitamin D, B12             -Heart Healthy strict 1.5 L fluid restriction per day    8: Hypertension: monitor TID and prn (?? home amlodipine and lisinopril held)             -continue Imdur 30 mh q AM (?? home 60 mg AM, 30 mg PM)             -continue Lopressor 25 mg BID (?? 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 I  messaged rehab pharmacist with regards to prednisone orders. (?? home prednisone 20 mg q AM) -continue pirfenidone -will need to establish new baseline oxygen  requirement at rest and with exertion. Observe with therapy.  -6/16 he reports his breathing is doing better, appears stable on 2 L nasal cannula -6/19 gets SOB with activity but overall he feels this is improving, provide education regarding use of [place] 02  6/21 continue O2 nasal cannula at home, patient reports he better understands how to use this going forward    12: Pulmonary hypertension/right heart failure: (?? home torsemide held)               -currently no diuretics (states torsemide gives him hand cramps)  Started Lasix 20mg  daily  -6/17 weight increased a little from yesterday, stable from several days prior.  Monitor for fluid overload.  Continue Lasix and consider increase dose if weight continues to increase  -6/19Wts stable , continue lasix 20mg   -6/20 weight appears to be trending down a little-continue follow trend, recheck BMP tomorrow, consider decrease Lasix dose  -6/21 weights trending down, creatinine and BUN to be trending up, hold Lasix for couple days and decrease Lasix dose 10mg , f/u with PCP  Filed Weights   12/08/22 0500 12/09/22 0743 12/10/22 0500  Weight: 79.6 kg 76.2 kg 74.8 kg      13: AKI atop CKD stage IIIb: creatinine reviewed, resolved      Latest Ref Rng & Units 12/10/2022    6:58 AM 12/06/2022    5:54 AM  12/01/2022    5:48 AM  BMP  Glucose 70 - 99 mg/dL 79  82  409   BUN 8 - 23 mg/dL 34  31  41   Creatinine 0.61 - 1.24 mg/dL 8.11  9.14  7.82   Sodium 135 - 145 mmol/L 136  135  138   Potassium 3.5 - 5.1 mmol/L 4.8  3.9  4.3   Chloride 98 - 111 mmol/L 94  95  97   CO2 22 - 32 mmol/L 33  32  32   Calcium 8.9 - 10.3 mg/dL 9.4  8.7  8.9    BUN improving despite lasix addition  Recheck BMP tomorrow 6/21 - Cr and BUN higher today, decrease hold lasix for couple days and decrease dose, f/u with PCP for recheck   14: CAD: continue aspirin, Plavix, statin             -follows with Dr. Tresa Endo  -Denies chest pain   15: Carotid stenosis: follow-up  outpatient  16. Hypoalbuminemia: encouraged high protein diet. Monitor CMP weekly.   17. Hyperglycemia: Improved, discussed elevated blood sugars and recommended avoiding added sugars in diet  -6/ 21 glucose 79 on BMP yesterday controlled   18. Lower extremity swelling: Increase lasix to 20mg  daily  19. Hypotension: change metoprolol to Toprol XL 25mg  HS  -6/21 well-controlled continue current regimen Vitals:   12/09/22 1929 12/10/22 0322  BP: 121/71 131/77  Pulse: 87 (!) 58  Resp: 15   Temp: 98.6 F (37 C)   SpO2: 96% 100%         LOS: 10 days A FACE TO FACE EVALUATION WAS PERFORMED  Fanny Dance 12/10/2022, 8:24 AM

## 2022-12-10 NOTE — Progress Notes (Signed)
Physical Therapy Discharge Summary  Patient Details  Name: Maurice Munoz MRN: [id number] Date of Birth: 30-Jun-1945  Date of Discharge from PT service:December 10, 2022  Today's Date: 12/10/2022  Patient has met 4 of 4 long term goals due to improved activity tolerance, improved balance, improved postural control, increased strength, decreased pain, and functional use of  right lower extremity and left lower extremity.  Patient to discharge at an ambulatory level Supervision.   Patient's care partner is independent to provide the necessary  supervision  assistance at discharge.  Reasons goals not met: N/A  Recommendation:  Patient will benefit from ongoing skilled PT services in home health setting to continue to advance safe functional mobility, address ongoing impairments in functional strength and endurance for home ambulation to progress towards modified independence with ambulation, to promote independence with home O2 management during mobility, and minimize fall risk.  Equipment: RW  Reasons for discharge: treatment goals met  Patient/family agrees with progress made and goals achieved: Yes  PT Discharge Precautions/Restrictions Precautions Precautions: None Precaution Comments: monitor O2 Restrictions Weight Bearing Restrictions: No Pain Pain Assessment Pain Scale: 0-10 Pain Score: 3  Pain Type: Acute pain Pain Location: Leg Pain Orientation: Right Pain Descriptors / Indicators: Aching Pain Onset: With Activity Pain Intervention(s): Ambulation/increased activity Multiple Pain Sites: No Pain Interference Pain Interference Pain Effect on Sleep: 1. Rarely or not at all Pain Interference with Therapy Activities: 2. Occasionally Pain Interference with Day-to-Day Activities: 2. Occasionally Vision/Perception  Vision - History Ability to See in Adequate Light: 0 Adequate Perception Perception: Within Functional Limits Praxis Praxis: Intact  Cognition Overall  Cognitive Status: Within Functional Limits for tasks assessed Arousal/Alertness: Awake/alert Orientation Level: Oriented X4 Sensation Sensation Light Touch: Impaired by gross assessment Hot/Cold: Appears Intact Proprioception: Appears Intact Stereognosis: Appears Intact Additional Comments: BLE neuropathy Coordination Gross Motor Movements are Fluid and Coordinated: Yes Fine Motor Movements are Fluid and Coordinated: Yes Motor  Motor Motor: Within Functional Limits;Other (comment) Motor - Skilled Clinical Observations: slow 2/2 debility  Mobility Bed Mobility Bed Mobility: Rolling Right;Right Sidelying to Sit Rolling Right: Independent with assistive device Right Sidelying to Sit: Independent with assistive device Transfers Transfers: Sit to Stand;Stand to Sit;Transfer;Stand Pivot Transfers Sit to Stand: Independent with assistive device Stand to Sit: Independent with assistive device Stand Pivot Transfers: Independent with assistive device Transfer (Assistive device): Rolling walker Locomotion  Gait Ambulation: Yes Gait Assistance: Supervision/Verbal cueing Gait Distance (Feet): 150 Feet Assistive device: Rolling walker Gait Gait: Yes Gait Pattern: Decreased stride length Stairs / Additional Locomotion Stairs: No Ramp: Supervision/Verbal cueing Curb: Supervision/Verbal cueing Wheelchair Mobility Wheelchair Mobility: No  Trunk/Postural Assessment  Cervical Assessment Cervical Assessment: Within Functional Limits Thoracic Assessment Thoracic Assessment: Within Functional Limits Lumbar Assessment Lumbar Assessment: Within Functional Limits Postural Control Postural Control: Within Functional Limits  Balance Balance Balance Assessed: Yes Dynamic Sitting Balance Dynamic Sitting - Balance Support: No upper extremity supported;Feet supported Dynamic Sitting - Level of Assistance: 6: Modified independent (Device/Increase time) Dynamic Sitting - Balance Activities:  Reaching for objects Dynamic Standing Balance Dynamic Standing - Balance Support: During functional activity;Left upper extremity supported Dynamic Standing - Level of Assistance: 6: Modified independent (Device/Increase time) Dynamic Standing - Balance Activities: Reaching for objects Extremity Assessment  RUE Assessment RUE Assessment: Within Functional Limits LUE Assessment LUE Assessment: Within Functional Limits      Malachi Pro, PT, DPT Dionne Milo, PT, DPT 12/10/2022, 9:39 AM

## 2022-12-10 NOTE — Progress Notes (Signed)
Occupational Therapy Session Note  Patient Details  Name: Maurice Munoz MRN: [id number] Date of Birth: 06-02-1946  Today's Date: 12/10/2022 OT Individual Time: 1020-1100 & 1305-1400 OT Individual Time Calculation (min): 40 min & 55 min   Short Term Goals: Week 1:  OT Short Term Goal 1 (Week 1): STG=LTG's d/t LOS  Skilled Therapeutic Interventions/Progress Updates:  Session 1 Skilled OT intervention completed with focus on BUE and cardiorespiratory endurance, simulated meal prep. Pt received seated in w/c, agreeable to session. Minimal pain reported in R knee however improved from yesterday; pt declined pain meds. OT offered rest breaks, repositioning throughout for pain reduction.  Pt politely declined all self-care needs. Transported pt dependently in w/c > gym for time.  Seated in w/c, pt completed the following BUE exercises to address strength and cardiorespiratory endurance needed for BADLs: (With yellow theraband) 12 reps Horizontal abduction Self-anchored shoulder flexion each arm Self-anchored bicep flexion each arm Self-anchored tricep extension each arm Therapist anchored scapular retraction each arm Shoulder external rotation Shoulder diagonal pulls  To simulate meal prep and item retrieval, pt retrieved 8 bean bags scattered own the gym at middle/high levels at close supervision ambulatory level using RW for balance. Cues needed for upright gaze due to heavy trunk and bilateral knee flexion and for keep hips centered within RW frame for balance and safety. No difficulty locating or retrieving items. Discussed how this relates to meal prep and gathering items and discussed plans for simple meals at DC.  Pt remained seated in w/c in room, with belt alarm on/activated, and with all needs in reach at end of session.  Vitals -Received on 2L via [place], SPO2 98% -97% after activity on 2L  Session 2 Skilled OT intervention completed with focus on family education with pt's  granddaughter present. Pt received seated in w/c, agreeable to session. No pain reported!  Reviewed intermittent supervision recommendation for ambulation due to imbalances and mild safety deficits such as RW positioning and entanglement of [place] cord with gait and supervision for showers/transfers however otherwise mod I. Also reviewed waiting to return to drive until cleared by a physician due to higher level cognitive sequencing and mobility needed for operating a vehicle.  Pt demonstrated to granddaughter the ability to: -Sit > stand using RW with mod I, ambulate with RW > BSC over toilet, stand with mod I using push up rail on BSC > walk in shower with close supervision for side step entry over "pretend threshold" like shower at home. -Discussed hand held shower head for ease of bathing and energy conservation method during shower -Ambulated > 150 ft with close supervision in hallway including 2 turns. Moderate SOB however SPO2 initially indicating low SPO2 and extremely elevated HR however switched hands due to pt moving/cold fingers, SPO2 then WNL and lower HR; however nurse later notified -Discussed his self-management of O2 independently at home  Granddaughter reports pt is moving better than PTA and is less SOB with mobility. Pt remained seated in w/c, with granddaughter present and all needs in reach at end of session.  Vitals -Received on 2L via [place], SPO2 98% -97% sat after activity on 2L   Therapy Documentation Precautions:  Precautions Precautions: Fall Precaution Comments: watch O2 and BP Restrictions Weight Bearing Restrictions: No    Therapy/Group: Individual Therapy  Melvyn Novas, MS, OTR/L  12/10/2022, 3:40 PM

## 2022-12-10 NOTE — Progress Notes (Addendum)
Was alerted by secure chat from PT at 2:45 pm that patient's heart rate was 125. I came to room to assess patient and his granddaughter at bedside stated occupational therapist checked the VS and pulse was "160". I have no verification of this reading. Patient stated he was more fatigued this afternoon. He denies worsening SOB, chest pain/pressure or palpitations. Currently VS: 97.9   16  84  132/63. EKG with SR, rate 79 On exam heart RRR. Lungs clear. Reviewed scenario with Dr. Jens Som. No change in orders. Reviewed AVS with pt and granddaughter, [name].

## 2022-12-10 NOTE — Telephone Encounter (Signed)
Pt c/o medication issue:  1. Name of Medication: metoprolol tartrate (LOPRESSOR) 50 MG tablet   2. How are you currently taking this medication (dosage and times per day)?   3. Are you having a reaction (difficulty breathing--STAT)?   4. What is your medication issue? Jesusita Oka, PA from [facility] calling to go over patient's lopressor

## 2022-12-10 NOTE — Progress Notes (Signed)
Physical Therapy Session Note  Patient Details  Name: Maurice Munoz MRN: [id number] Date of Birth: 08/23/1945  Today's Date: 12/10/2022 PT Individual Time: 4098-1191 PT Individual Time Calculation (min): 57 min   Short Term Goals: Week 1:  PT Short Term Goal 1 (Week 1): STG = LTG 2/2 LOS  Skilled Therapeutic Interventions/Progress Updates:     Pt received seated in [facility] and agrees to therapy. Pt's grand daughter present for family education. Pt reports pain in Rt knee but states that it has gotten a lot better. PT provides update to family regarding pt's progress with mobility a well as recommendations for safe mobility following discharge. WC transport to gym. Pt completes car transfer without AD, with cues for sequencing and positioning. Following rest break, pt ambulates x120' with RW and cues for upright gaze to improve posture and balance. Immediately following ambulation, PT assesses pt's vitals and pt noted to have HR of 130 bpm with O2 at 100% on 2L/min O2 via nasal cannula. PT provides cues for pursed lip breathing and pt's HR decreases to <100 within 1-2 minutes. Pt transported to dayroom and several minutes later vital re-checked, with HR at 130 bpm, despite pt not having performed any additional physical activity. Pt's HR remains around 125 bpm for next 10-15 minutes. PT notifies RN, PA, and MD. [facility] transport back to room. Stand pivot to bed with cues for sequencing. Pt left semi reclined with all needs within reach.   Therapy Documentation Precautions:  Precautions Precautions: None Precaution Comments: monitor O2 Restrictions Weight Bearing Restrictions: No   Therapy/Group: Individual Therapy  Beau Fanny, PT, DPT 12/10/2022, 4:46 PM

## 2022-12-10 NOTE — Progress Notes (Signed)
Inpatient Rehabilitation Care Coordinator Discharge Note   Patient Details  Name: AHLIJAH RAIA MRN: [id number] Date of Birth: [birth year]   Discharge location: Home  Length of Stay: 11 Days  Discharge activity level: Supervision  Home/community participation: Granddaughter and Brother  Patient response HY:QMVHQI Literacy - How often do you need to have someone help you when you read instructions, pamphlets, or other written material from your doctor or pharmacy?: Sometimes  Patient response ON:GEXBMW Isolation - How often do you feel lonely or isolated from those around you?: Never  Services provided included: MD, RD, PT, OT, RN, SLP, CM, TR, Pharmacy, SW  Financial Services:  Financial Services Utilized: Medicare    Choices offered to/list presented to: Patient and granddaughter  Follow-up services arranged:  Home Health, DME Home Health Agency: [facility]    DME : [employer] and bedside commode. [employer] [phone]    Patient response to transportation need: Is the patient able to respond to transportation needs?: Yes In the past 12 months, has lack of transportation kept you from medical appointments or from getting medications?: No In the past 12 months, has lack of transportation kept you from meetings, work, or from getting things needed for daily living?: No   Patient/Family verbalized understanding of follow-up arrangements:  Yes  Individual responsible for coordination of the follow-up plan: Granddaughter [phone]  Confirmed correct DME delivered: Andria Rhein 12/10/2022    Comments (or additional information):  Summary of Stay    Date/Time Discharge Planning CSW  12/07/22 1308 Patient discharging home with assistance from his granddaughter and brother. 1 level home, 1 step to enter. Patient has a RW, Barrister's clerk. CJB  12/02/22 4742 Patient plans to discharge home with assistance from his granddaughter and his brother.  Patient lives in a 1 level home,1 step up to enter CJB       [employer]

## 2022-12-10 NOTE — Telephone Encounter (Signed)
Dan with Rehab ask patient original metoprolol dose.  Patient has been with them since hospitalization.  States patient tachycardiac.  He asked original metoprolol dose.  While speaking with Dr Tresa Endo, the call was ende3d on his end. Did attempt to call back to relay information below.   Spoke with Dr Gurney Maxin, he shows per his visit with Cath that patient medication was changed due to Severe pulmonary HTN. Patient was placed on Metoprolol Tartrate 50mg   2 tablets in the morning (100 mg)  and 1 1/2 tablets  75 mg in the evening .

## 2022-12-10 NOTE — Progress Notes (Signed)
Recreational Therapy Discharge Summary Patient Details  Name: Maurice Munoz MRN: [id number] Date of Birth: 20-Mar-1946 Today's Date: 12/10/2022  Comments on progress toward goals: Pt has made good progress during LOS and is discharging home tomorrow with support from family and friends.  TR sessions focused on activity tolerance and patient education.  Pt participated in dance group during LOS at supervision seated level.  Education focused on leisure education including activity analysis and identification of modifications.  Pt is anxious to return home and participating in previous leisure activities. Reasons for discharge: discharge from hospital  Follow-up: Home Health  Patient/family agrees with progress made and goals achieved: Yes  Shaquera Ansley 12/10/2022, 11:24 AM

## 2022-12-10 NOTE — Progress Notes (Signed)
Physical Therapy Session Note  Patient Details  Name: Maurice Munoz MRN: [id number] Date of Birth: March 25, 1946  Today's Date: 12/10/2022 PT Individual Time: 0900-0940 PT Individual Time Calculation (min): 40 min   Short Term Goals: Week 1:  PT Short Term Goal 1 (Week 1): STG = LTG 2/2 LOS  Skilled Therapeutic Interventions/Progress Updates:    Chart reviewed and pt agreeable to therapy. Pt received semi-reclined in bed with 3/10 c/o pain in knees and on 2LO2 via [place]. Session focused on functional review for d/c and preparation for d/c to home. Pt initiated session with review of mobility as noted in d/c note. Pt then completed ramp and curb step practice which pt completed using S + Rw. Pt then returned to room and discussed continuation of care with PT with noted confirmation of understanding on [facility] PT and need to maintain balance and mobility training in setting of O2 needs. At end of session, pt was left seated in [facility] with alarm engaged, nurse call bell and all needs in reach.     Therapy Documentation Precautions:  Precautions Precautions: None Precaution Comments: monitor O2 Restrictions Weight Bearing Restrictions: No General:       Therapy/Group: Individual Therapy  Dionne Milo, PT, DPT 12/10/2022, 10:46 AM

## 2022-12-10 NOTE — Progress Notes (Signed)
Inpatient Rehabilitation Discharge Medication Review by a Pharmacist   A complete drug regimen review was completed for this patient to identify any potential clinically significant medication issues.  High Risk Drug Classes Is patient taking? Indication by Medication  Antipsychotic No   Anticoagulant No   Antibiotic No   Opioid No   Antiplatelet Yes Aspirin, clopidogrel - s/p stent  Hypoglycemics/insulin No   Vasoactive Medication Yes Imdur, Metoprolol XL, furosemide- CAD NTG SL- prn chest pain Pirfenidone-pulmonary fibrosis  Chemotherapy No   Other Yes Allopurinol - gout Atorvastatin - HLD Tizanidine - muscle spasms Cyanocobalamin- Vitamin B12 supplement Vitamin D-supplement     Type of Medication Issue Identified Description of Issue Recommendation(s)  Drug Interaction(s) (clinically significant)     Duplicate Therapy     Allergy     No Medication Administration End Date     Incorrect Dose     Additional Drug Therapy Needed     Significant med changes from prior encounter (inform family/care partners about these prior to discharge). PTA meds:   prednisone and torsemide discontinued.  Torsemide changed to Furosemide. Metoprolol tartrate changed to Metoprolol succinate.(XL)   PTA meds: amlodipine and lisinopril were discontinued on [facility] acute care discharge.  Instructions on discharge AVS.   Other       Clinically significant medication issues were identified that warrant physician communication and completion of prescribed/recommended actions by midnight of the next day:  No  Time spent performing this drug regimen review (minutes):  20 minutes    Thank you for allowing pharmacy to be part of this patients care team. Noah Delaine, RPh Clinical Pharmacist 12/11/22 9:34 AM

## 2022-12-10 NOTE — Progress Notes (Signed)
Occupational Therapy Discharge Summary  Patient Details  Name: Maurice Munoz MRN: [id number] Date of Birth: 1946-01-16  Date of Discharge from OT service:December 10, 2022  Patient has met 10 of 10 long term goals due to improved activity tolerance, improved balance, functional use of  RIGHT lower extremity, and improved coordination.  Patient to discharge at overall Modified Independent to Supervision level.  Patient's granddaughter was present for education and is independent to provide the necessary physical assistance at discharge.   All goals met  Recommendation:  Patient will benefit from ongoing skilled OT services in home health setting to continue to advance functional skills in the area of BADL, iADL, and Reduce care partner burden.  Equipment: 3 in 1 standard BSC  Reasons for discharge: treatment goals met  Patient/family agrees with progress made and goals achieved: Yes  OT Discharge Precautions/Restrictions  Precautions Precautions: Fall Precaution Comments: monitor O2 Restrictions Weight Bearing Restrictions: No ADL ADL Eating: Independent Where Assessed-Eating: Wheelchair Grooming: Modified independent Where Assessed-Grooming: Standing at sink Upper Body Bathing: Modified independent Where Assessed-Upper Body Bathing: Shower Lower Body Bathing: Supervision/safety Where Assessed-Lower Body Bathing: Shower Upper Body Dressing: Modified independent (Device) Where Assessed-Upper Body Dressing: Edge of bed Lower Body Dressing: Modified independent Where Assessed-Lower Body Dressing: Sitting at sink, Standing at sink Toileting: Modified independent Where Assessed-Toileting: Bedside Commode, Toilet Toilet Transfer: Close supervision Toilet Transfer Method: Proofreader: Other (comment), Raised toilet seat, Grab bars (RW) Tub/Shower Transfer: Not assessed (has walk in shower at home) Tub/Shower Transfer Method: Unable to assess [employer] Transfer: Close supervision Film/video editor Method: Designer, industrial/product: Emergency planning/management officer, Grab bars, Other (comment) (RW) ADL Comments: min A UB, max- mod A LB, min A txfrs with SW- may be open to RW for respiratory even rollator Vision Baseline Vision/History: 1 Wears glasses Patient Visual Report: No change from baseline Vision Assessment?: No apparent visual deficits Perception  Perception: Within Functional Limits Praxis Praxis: Intact Cognition Cognition Overall Cognitive Status: Within Functional Limits for tasks assessed Arousal/Alertness: Awake/alert Memory: Appears intact Awareness: Appears intact Problem Solving: Appears intact Safety/Judgment: Appears intact Brief Interview for Mental Status (BIMS) Repetition of Three Words (First Attempt): 3 Temporal Orientation: Year: Correct Temporal Orientation: Month: Accurate within 5 days Temporal Orientation: Day: Correct Recall: "Sock": Yes, no cue required Recall: "Blue": Yes, no cue required Recall: "Bed": Yes, no cue required BIMS Summary Score: 15 Sensation Sensation Light Touch: Impaired by gross assessment Hot/Cold: Appears Intact Proprioception: Appears Intact Stereognosis: Appears Intact Additional Comments: BLE neuropathy Coordination Gross Motor Movements are Fluid and Coordinated: Yes Fine Motor Movements are Fluid and Coordinated: Yes Motor  Motor Motor: Within Functional Limits;Other (comment) Motor - Skilled Clinical Observations: slow 2/2 debility Mobility  Bed Mobility Bed Mobility: Rolling Right;Right Sidelying to Sit Rolling Right: Independent with assistive device Right Sidelying to Sit: Independent with assistive device Transfers Sit to Stand: Independent with assistive device Stand to Sit: Independent with assistive device  Trunk/Postural Assessment  Cervical Assessment Cervical Assessment: Within Functional Limits Thoracic Assessment Thoracic Assessment:  Within Functional Limits Lumbar Assessment Lumbar Assessment: Within Functional Limits Postural Control Postural Control: Within Functional Limits  Balance Balance Balance Assessed: Yes Dynamic Sitting Balance Dynamic Sitting - Balance Support: No upper extremity supported;Feet supported Dynamic Sitting - Level of Assistance: 6: Modified independent (Device/Increase time) Dynamic Standing Balance Dynamic Standing - Balance Support: During functional activity;Left upper extremity supported Dynamic Standing - Level of Assistance: 6: Modified independent (Device/Increase time) (RW) Extremity/Trunk Assessment  RUE Assessment RUE Assessment: Within Functional Limits LUE Assessment LUE Assessment: Within Functional Limits   Somtochukwu Woollard E Nielle Duford, MS, OTR/L  12/10/2022, 3:41 PM

## 2022-12-10 NOTE — Plan of Care (Signed)
  Problem: RH Balance Goal: LTG Patient will maintain dynamic standing with ADLs (OT) Description: LTG:  Patient will maintain dynamic standing balance with assist during activities of daily living (OT)  Outcome: Completed/Met   Problem: Sit to Stand Goal: LTG:  Patient will perform sit to stand in prep for activites of daily living with assistance level (OT) Description: LTG:  Patient will perform sit to stand in prep for activites of daily living with assistance level (OT) Outcome: Completed/Met   Problem: RH Grooming Goal: LTG Patient will perform grooming w/assist,cues/equip (OT) Description: LTG: Patient will perform grooming with assist, with/without cues using equipment (OT) Outcome: Completed/Met   Problem: RH Bathing Goal: LTG Patient will bathe all body parts with assist levels (OT) Description: LTG: Patient will bathe all body parts with assist levels (OT) Outcome: Completed/Met   Problem: RH Dressing Goal: LTG Patient will perform upper body dressing (OT) Description: LTG Patient will perform upper body dressing with assist, with/without cues (OT). Outcome: Completed/Met Goal: LTG Patient will perform lower body dressing w/assist (OT) Description: LTG: Patient will perform lower body dressing with assist, with/without cues in positioning using equipment (OT) Outcome: Completed/Met   Problem: RH Toileting Goal: LTG Patient will perform toileting task (3/3 steps) with assistance level (OT) Description: LTG: Patient will perform toileting task (3/3 steps) with assistance level (OT)  Outcome: Completed/Met   Problem: RH Simple Meal Prep Goal: LTG Patient will perform simple meal prep w/assist (OT) Description: LTG: Patient will perform simple meal prep with assistance, with/without cues (OT). Outcome: Completed/Met   Problem: RH Toilet Transfers Goal: LTG Patient will perform toilet transfers w/assist (OT) Description: LTG: Patient will perform toilet transfers with  assist, with/without cues using equipment (OT) Outcome: Completed/Met   Problem: RH Tub/Shower Transfers Goal: LTG Patient will perform tub/shower transfers w/assist (OT) Description: LTG: Patient will perform tub/shower transfers with assist, with/without cues using equipment (OT) Outcome: Completed/Met   

## 2022-12-13 ENCOUNTER — Ambulatory Visit: Payer: Medicare Other | Attending: Cardiovascular Disease | Admitting: Cardiovascular Disease

## 2022-12-13 ENCOUNTER — Encounter: Payer: Self-pay | Admitting: Cardiovascular Disease

## 2022-12-13 DIAGNOSIS — R000 Tachycardia, unspecified: Secondary | ICD-10-CM | POA: Insufficient documentation

## 2022-12-13 DIAGNOSIS — Z09 Encounter for follow-up examination after completed treatment for conditions other than malignant neoplasm: Secondary | ICD-10-CM | POA: Diagnosis present

## 2022-12-13 DIAGNOSIS — I2510 Atherosclerotic heart disease of native coronary artery without angina pectoris: Secondary | ICD-10-CM | POA: Insufficient documentation

## 2022-12-13 DIAGNOSIS — E785 Hyperlipidemia, unspecified: Secondary | ICD-10-CM | POA: Diagnosis present

## 2022-12-13 DIAGNOSIS — J8410 Pulmonary fibrosis, unspecified: Secondary | ICD-10-CM | POA: Insufficient documentation

## 2022-12-13 DIAGNOSIS — I10 Essential (primary) hypertension: Secondary | ICD-10-CM | POA: Insufficient documentation

## 2022-12-13 DIAGNOSIS — I25119 Atherosclerotic heart disease of native coronary artery with unspecified angina pectoris: Secondary | ICD-10-CM | POA: Diagnosis present

## 2022-12-13 DIAGNOSIS — N1831 Chronic kidney disease, stage 3a: Secondary | ICD-10-CM | POA: Diagnosis present

## 2022-12-13 MED ORDER — METOPROLOL SUCCINATE ER 25 MG PO TB24: ORAL_TABLET | ORAL | 3 refills | Status: DC

## 2022-12-13 NOTE — Patient Instructions (Addendum)
Medication Instructions:   Take one tablet metoprolol succinate 25mg  in the morning and take  half tablet(12.5mg ) at night  *If you need a refill on your cardiac medications before your next appointment, please call your pharmacy*   Lab Work: No labs If you have labs (blood work) drawn today and your tests are completely normal, you will receive your results only by: MyChart Message (if you have MyChart) OR A paper copy in the mail If you have any lab test that is abnormal or we need to change your treatment, we will call you to review the results.   Testing/Procedures: No testing   Follow-Up: At [facility], you and your health needs are our priority.  As part of our continuing mission to provide you with exceptional heart care, we have created designated Provider Care Teams.  These Care Teams include your primary Cardiologist (physician) and Advanced Practice Providers (APPs -  Physician Assistants and Nurse Practitioners) who all work together to provide you with the care you need, when you need it.  We recommend signing up for the patient portal called "MyChart".  Sign up information is provided on this After Visit Summary.  MyChart is used to connect with patients for Virtual Visits (Telemedicine).  Patients are able to view lab/test results, encounter notes, upcoming appointments, etc.  Non-urgent messages can be sent to your provider as well.   To learn more about what you can do with MyChart, go to [email or web address].    Your next appointment:   2 month(s) with APP  4 months with MD  Provider:   [name], MD

## 2022-12-13 NOTE — Progress Notes (Unsigned)
Patient ID: Maurice Munoz, male   DOB: 09-27-1945, 77 y.o.   MRN: [id number]        HPI: Maurice Munoz, is a 77 y.o. male who presents to the office today for a2  month cardiology followup evaluation.     [name] has established CAD dating back to 1999 when he underwent intervention to his RCA and PDA. In April 2007 he underwent stenting to his LAD and distal RCA at which time Maurice Munoz stents were inserted. He has a history of hypertension, obesity, hyperlipidemia, gout, and  lower extremity edema.   A two-year followup nuclear perfusion study in March 2014 was essentially unchanged from his prior study and continued to show fairly normal perfusion and was low risk, without evidence for scar. There was no significant ischemia.  In 2015 he had an injury to his right leg.  He had noted some intermittent leg swelling.  He had carotids Doppler study in January 2016 which showed 50-60% left proximal ICA diameter reduction, which had slightly increased from his prior study one year previously.  Because of a history of coughing up blood with pleuritic chest pain he underwent a CT Ango of his chest on March 21 which was negative for PE but showed diffuse peripheral lung densities throughout both upper and lower zones without honeycombing raising the Maurice Munoz concern for mild fibrotic changes but the pattern was nonspecific.    Over the last several years he has lost over 40 pounds from his peak weight.  He  was seen in the emergency room with lightheadedness and was hypotensive.  He was told to discontinue his isosorbide as well as lisinopril.   In the setting of his hypotension he was found to have an increased creatinine at 1.4 recently had been 1.1. He has been followed by Maurice Munoz in [place] for his renal issues.  Over the past year, he has experienced occasional episodes of chest pain which he describes as tightness.  Typically, this can occur with exertion.  At times he also notices  a chest burning.  He admits to shortness of breath.  He has been evaluated by Maurice Munoz for pulmonary fibrosis.  He has an extensive occupational history infrequently had worked in Administrator, sports, Chartered certified accountant, and chrome plating for many years and was often exposed to significant dust inhalation.  He also had worked in Pensions consultant.  There is remote tobacco history of 2-3 packs per day for 20-25 years, but unfortunately he quit in 1980s.  He is felt most likely to have interstitial lung disease and continues further evaluation.  He has had difficulty with his low back.  He has been on lisinopril 5 mg, metoprolol 50 mg twice a day.  He continues to take simvastatin 20 mg for hyperlipidemia.  He is been maintained on aspirin and Plavix.  There is remote history of gout, which is controlled with allopurinol.  When I saw him on February 2019, he had noticed episodes of chest burning as well as chest tightness and continues to experience exertional dyspnea.  I initiated amlodipine at 5 mg.  I strongly recommended support stockings for his potential edema.  He was given a prescription for sublabel nitroglycerin.  He underwent a follow-up Lexiscan Myoview study of 08/12/2017 which was low risk and without ischemia.  EF was 57%.  There was minimal apical thinning most likely contributed by his body habitus.  He underwent an echo Doppler study on 10/14/2017 which showed an EF of 60-65% and  grade 2 diastolic dysfunction.  There was moderate pulmonary hypertension with PA pressure at 46 mm.    At evaluation in March 2019 he was feeling better.  He denied recurrent chest pain or shortness of breath.  He is lost 21 pounds in 3 weeks purposefully.   He developed recurrent chest pain symptomatology and developed unstable angina symptoms leading to hospitalization with a non-ST segment elevation MI.  On April 12, 2018 he underwent diagnostic catheterization by Maurice Munoz due to multivessel CAD he underwent surgical consultation for  consideration of CABG surgery but was turned down.  As result, on April 14, 2018, I performed successful multi lesion/multivessel PCI to the RCA and LAD vessels.  He underwent Cutting Balloon PTCA to the mid PDA vessel of the RCA with a 95% stenosis being reduced to 15% and Cutting Balloon PTCA Munoz stenting with a 2.5 x12 mm Resolute stent inserted into the distal RCA immediately proximal to the takeoff of the PDA vessel with stent overlap to the previously placed stent with a 99% stenosis reduced to 0%.  He also underwent 3 lesion intervention to the LAD with PTCA of the 90% apical LAD stenosis, PTCA Munoz stenting at 2 additional study sites in the LAD successfully.  He still had residual concomitant 80% AV groove circumflex and circumflex marginal disease which was treated medically.  Maurice Munoz has noted significant improvement following his multilesion multivessel intervention to his RCA and LAD.  He has been seen in the office since by Maurice Sims, NP as well as Maurice Deforest, PA.  When last seen by Maurice Munoz, he reduced his amlodipine since his blood pressure was low and he also had 2+ pitting edema for which he recommended restarting his previous torsemide daily.  I saw him in November 2020.  He underwent an echo Doppler study 24 2020 which showed normal systolic with EF at 60 to 65%, mild LVH diastolic parameters were indeterminate.  He had moderately elevated pulmonary artery systolic pressure at 42 mm.    He was  evaluated by me on Nov 16, 2019.  He had done well since his last evaluation but over the past 2 weeks has again begun to notice some recurrent episodes of chest pain and shortness of breath.  He also has been diagnosed with pulmonary fibrosis.  He continues to experience mild leg swelling.  During that evaluation I recommended initiating isosorbide mononitrate 30 mg.  He had been on low-dose amlodipine at 2.5 mg and I recommended further titration of 5 mg.  He continues to be on metoprolol  tartrate 25 mg twice a day and remained on DAPT with aspirin/Plavix.   He was to follow-up with Dr. Marshell Garfinkel for his pulmonary fibrosis.  He has been started on Esbriet for his pulmonary fibrosis.  He has noticed leg swelling bilaterally left greater than right.  Approximately 2 weeks ago he took sublingual nitroglycerin with improvement of his mild chest pain.  During that evaluation, he was on amlodipine 5 mg and I recommended further titration of isosorbide to 60 mg daily and that he continue metoprolol tartrate 50 mg twice a day.  He had 2+ bilateral leg swelling and I recommended torsemide 20 mg daily and support stockings.  I saw him in September 2021 and over the prior several months he had remained fairly stable and denied recurrent chest pain on his increased medical regimen.  He continues to be on supplemental oxygen for his pulmonary fibrosis.  He was continuing to experience some  leg swelling right greater than left but had not been using support stockings.  During that evaluation I again recommended he use support stockings.  When I saw him on August 19, 2020 he was continuing to experience leg swelling and was taking torsemide 2 times per week.  He has noticed an increased heart rate with activity.  He has been on amlodipine 5 mg, isosorbide 60 mg, metoprolol 50 mg twice a day in addition to torsemide.  He continues to be on DAPT with aspirin/Plavix.  He is tolerating atorvastatin 80 mg.  Laboratory was checked at [facility] in July 2021 and lipids were excellent.  Recently he was sleeping in a recliner.  He has not had recent laboratory.  During that evaluation, he denied recurrent anginal symptoms but he noticed very quick increase in his heart rate.  I suggested slight titration of metoprolol to tartrate from 50 mg twice a day to 75 mg in the morning and 50 mg at night.  He had 2+ lower extremity edema and I suggested he take torsemide daily for the next several days and then perhaps  every other day.  He continued to be followed by Dr. Mannam for his interstitial fibrosis and was on Esbriet.  I saw him on May 19, 2021.  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 Unfortunately, his wife died in June from metastatic cancer.  He now lives by himself.  He had recently been started on lisinopril at 2.5 mg by Dr. Bhutani and at his last evaluation this was increased to 5 mg.  He has noticed fatigability and some lightheadedness since this medication was increased.  He denies chest pain or palpitations.  During that evaluation, blood pressure was low knowing his titration of lisinopril by Dr. Bhutani to 5 mg and at that time recommended reducing back down to 2.5 mg with follow-up with Dr. Bhutani.  I saw him on September 11, 2021.  Since his prior evaluation he had lost approximately 60 pounds since he was first started on Esbriet for his pulmonary fibrosis mainly due to appetite suppression and nausea.  Presently, he is on amlodipine 5 mg, isosorbide 60 mg, lisinopril 2.5 mg, metoprolol tartrate 50 mg in the morning and evening, torsemide 20 mg for blood pressure and his CAD.  He continues to be on DAPT with aspirin/clopidogrel.  He is on Esbriet 801 mg tablets but instead of taking this 3 times a day has only been taking it 2 times per day.    I last saw him on May 26, 2022 at which time he continued to be stable.  He was experiencing some shortness of breath with walking.  He was on supplemental oxygen typically 3 L/day.  He denies any significant episodes of chest pain but at times has had a rare short-lived episode with prompt relief.  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  He does experience some lower extremity edema.  Apparently he has not been taking torsemide 20 mg for the last 3 to 4 weeks.  He continues to be followed by Dr. Mannam for his interstitial fibrosis and also sees Dr. Bhutani for his stage IIIa CKD.     Since I last saw him, he underwent a 2D echo Doppler study on June 30, 2022.  This revealed EF at 60 to 65%.  There was mildly reduced RV systolic function with mild dilatation.  Estimated RV systolic pressure was increased   at 58.4 mm.  Mitral and aortic valves were grossly normal.  He has been seen by Dr. Isaiah Serge and had undergone CT chest high-resolution on August 31, 2022.  This showed fibrotic interstitial lung disease with honeycomb change.  There was possible mild progression when compared to a prior study in November 2020.  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  He was also noted to have enlarged mediastinal and hilar lymph nodes unchanged felt to be reactive.  His pulmonary artery was enlarged.  He had severe coronary artery calcification and aortic atherosclerosis.  When evaluated by Dr. De Hollingshead on September 08, 2022 was on Esbriet and with his moderate pulmonary hypertension it was recommended that he have a right heart catheterization.  Presently, [name] has continued to use supplemental oxygen at 4 to 5 L.  He tells me he experienced an episode of chest burning on March 20 associated with increasing shortness of breath.  He has been on an antianginal regimen of amlodipine 5 mg, isosorbide 60 mg in the morning, lisinopril 2.5 mg and metoprolol tartrate 50 mg twice a day.  He has continued to be on DAPT with aspirin/Plavix and is on atorvastatin 80 mg for hyperlipidemia.  He is on Esbriet 801 mg tablet 3 times a day in addition to as needed prednisone for gout.  He takes torsemide 20 mg daily.  He presents for evaluation.   Past Medical History:  Diagnosis Date   Adrenal adenoma, left    small   Aortic atherosclerosis (HCC)    Back pain    Bilateral carotid artery stenosis followed by dr Tresa Endo   per last carotid duplex 07-17-2014  -- proximal LICA 50-69% ,  RICA 0-49%   CAD (coronary artery disease)    a. s/p prior intervention to RCA and PDA in 1999 b. Munoz to LAD and distal RCA in 2007    Cardiomegaly    Stable   Chronic constipation    Chronic pain syndrome    CKD (chronic kidney disease), stage III (HCC)    DDD (degenerative disc disease), cervical    DDD (degenerative disc disease), lumbosacral    Dyspnea    Dyspnea on exertion    Gout    per pt last inflared episode 2015 approx.   Grade II diastolic dysfunction    H/O epistaxis    per pt sees ENT dr Suszanne Conners for cauterization (pt takes plavix)   History of acute pancreatitis 08/08/2014   History of kidney stones    HTN (hypertension)    Hyperlipidemia    Interstitial lung disease (HCC)    Left arm pain    s/p fall   Nocturia    Numbness of right foot    due to DDD lumbar   Peripheral edema    Pilonidal cyst    Pleural effusion on right    Pulmonary fibrosis (HCC)    followed by pcp-- last chest CT 04-08-2016   S/P drug eluting coronary stent placement 10/11/2005   mLAD and dRCA   Wears dentures    upper   Wears glasses     Past Surgical History:  Procedure Laterality Date   ANTERIOR CERVICAL DECOMP/DISCECTOMY FUSION  12-25-2001     dr Terrilee Files [facility]   C3 -- C7   ANTERIOR CERVICAL DECOMP/DISCECTOMY FUSION  12-11-2012   dr Channing Mutters  [facility])   CARDIOVASCULAR STRESS TEST  08-23-2012    dr Tresa Endo   normal lexiscan nuclear study w/ no ischemia/  normal LV function and  wall motion, ef 65%   CARDIOVASCULAR STRESS TEST     CORONARY ANGIOPLASTY  1999   PTCA to RCA & PDA   CORONARY ANGIOPLASTY WITH STENT PLACEMENT  10-11-2005  dr [name]   PCI and stenting to midLAD & dRCA (Maurice Munoz x2)   CORONARY BALLOON ANGIOPLASTY N/A 04/14/2018   Procedure: CORONARY BALLOON ANGIOPLASTY;  Surgeon: Lennette Bihari, MD;  Location[facility] INVASIVE CV LAB;  Service: Cardiovascular;  Laterality: N/A;   CORONARY STENT INTERVENTION N/A 04/14/2018   Procedure: CORONARY STENT INTERVENTION;  Surgeon: Lennette Bihari, MD;  Location: [facility] INVASIVE CV LAB;  Service: Cardiovascular;  Laterality: N/A;   CYSTOSCOPY W/ URETERAL  STENT PLACEMENT Left 02-11-2010     [facility]   LEFT HEART CATH N/A 04/14/2018   Procedure: Left Heart Cath;  Surgeon: Lennette Bihari, MD;  Location: [facility] INVASIVE CV LAB;  Service: Cardiovascular;  Laterality: N/A;   LEFT HEART CATH AND CORONARY ANGIOGRAPHY N/A 04/12/2018   Procedure: LEFT HEART CATH AND CORONARY ANGIOGRAPHY;  Surgeon: Corky Crafts, MD;  Location: [facility] INVASIVE CV LAB;  Service: Cardiovascular;  Laterality: N/A;   LUMBAR LAMINECTOMY  2015   L5 -- S1   PILONIDAL CYST EXCISION  1980s   PILONIDAL CYST EXCISION N/A 03/31/2017   Procedure: EXCISION OF PILONIDAL DISEASE with Flap Rotation;  Surgeon: Romie Levee, MD;  Location: [facility];  Service: General;  Laterality: N/A;   RIGHT/LEFT HEART CATH AND CORONARY ANGIOGRAPHY N/A 10/18/2022   Procedure: RIGHT/LEFT HEART CATH AND CORONARY ANGIOGRAPHY;  Surgeon: Lennette Bihari, MD;  Location: [facility] INVASIVE CV LAB;  Service: Cardiovascular;  Laterality: N/A;   THROAT SURGERY  1996   per pt "removal piece of cryloid(?) that was pressing against throat"  states no issues since   TRANSTHORACIC ECHOCARDIOGRAM  08-24-2010  dr Tresa Endo   ef 50-55%/  mild MR/  trivial TR   WOUND DEBRIDEMENT N/A 10/06/2017   Procedure: DEBRIDEMENT WOUND PLACEMENT OF WOUND HEALING MATRIX;  Surgeon: Romie Levee, MD;  Location: [facility];  Service: General;  Laterality: N/A;    Allergies  Allergen Reactions   Ibuprofen Itching    Motrin brand   Neosporin [Neomycin-Bacitracin Zn-Polymyx] Swelling   Penicillins Rash     Has patient had a PCN reaction causing immediate rash, facial/tongue/throat swelling, SOB or lightheadedness with hypotension: No Has patient had a PCN reaction causing severe rash involving mucus membranes or skin necrosis: No Has patient had a PCN reaction that required hospitalization: No Has patient had a PCN reaction occurring within the last 10 years: No If all of the above answers are "NO", then may  proceed with Cephalosporin use.     Current Outpatient Medications  Medication Sig Dispense Refill   acetaminophen (TYLENOL) 325 MG tablet Take 2 tablets (650 mg total) by mouth 4 (four) times daily -  before meals and at bedtime.     allopurinol (ZYLOPRIM) 300 MG tablet Take 300 mg by mouth in the morning.     aspirin EC 81 MG tablet Take 81 mg by mouth in the morning.     atorvastatin (LIPITOR) 80 MG tablet TAKE ONE TABLET (80MG  TOTAL) BY MOUTH AT6PM (Patient taking differently: Take 80 mg by mouth daily.) 90 tablet 3   clopidogrel (PLAVIX) 75 MG tablet TAKE ONE (1) TABLET BY MOUTH EVERY DAY 90 tablet 2   cyanocobalamin (VITAMIN B12) 1000 MCG tablet Take 1,000 mcg by mouth in the morning.     ESBRIET  801 MG TABS Take 1 tablet (801 mg total) by mouth 3 (three) times daily with meals. (Patient taking differently: Take 1 tablet by mouth See admin instructions. Take 1 tablet (801 mg) by mouth up to 3 times daily with meals.) 270 tablet 1   furosemide (LASIX) 20 MG tablet Take 1/2 tablet (10 mg total) by mouth daily. 30 tablet 0   isosorbide mononitrate (IMDUR) 30 MG 24 hr tablet Take 1 tablet (30 mg total) by mouth every morning. (Patient taking differently: Take 60 mg by mouth every morning.)     metoprolol succinate (TOPROL-XL) 25 MG 24 hr tablet Take 1 tablet (25 mg total) by mouth at bedtime. 30 tablet 0   tiZANidine (ZANAFLEX) 4 MG tablet Take 4 mg by mouth at bedtime.      VITAMIN D PO Take 5,000 Units by mouth in the morning.     nitroGLYCERIN (NITROSTAT) 0.4 MG SL tablet Place 1 tablet (0.4 mg total) under the tongue every 5 (five) minutes as needed for chest pain. (Patient not taking: Reported on 12/13/2022) 25 tablet 3   No current facility-administered medications for this visit.    Socially he is married. There are no children. Has a remote tobacco history having quit approximately 16 years ago. He's not very active. He does not routinely exercise.  ROS General: Negative; No  fevers, chills, or night sweats; weight loss since initiating Esbriet HEENT: Negative; No changes in vision or hearing, sinus congestion, difficulty swallowing Pulmonary: Positive for pulmonary fibrosis Cardiovascular: See history of present illness GI: Negative; No nausea, vomiting, diarrhea, or abdominal pain GU: Negative; No dysuria, hematuria, or difficulty voiding Musculoskeletal: Negative; no myalgias, joint pain, or weakness Hematologic/Oncology: Negative; no easy bruising, bleeding Endocrine: Negative; no heat/cold intolerance; no diabetes Neuro: Negative; no changes in balance, headaches Skin: Negative; No rashes or skin lesions Psychiatric: Negative; No behavioral problems, depression Sleep: Negative; No snoring, daytime sleepiness, hypersomnolence, bruxism, restless legs, hypnogognic hallucinations, no cataplexy Other comprehensive 14 point system review is negative.   PE BP 102/66 (BP Location: Left Arm, Patient Position: Sitting, Cuff Size: Normal)   Pulse (!) 127   Ht 6\' 1"  (1.854 m)   Wt 182 lb (82.6 kg)   SpO2 93%   BMI 24.01 kg/m    Blood pressure by me was 110/70   Wt Readings from Last 3 Encounters:  12/13/22 182 lb (82.6 kg)  12/11/22 165 lb 9.1 oz (75.1 kg)  11/30/22 187 lb 13.3 oz (85.2 kg)     Physical Exam BP 102/66 (BP Location: Left Arm, Patient Position: Sitting, Cuff Size: Normal)   Pulse (!) 127   Ht 6\' 1"  (1.854 m)   Wt 182 lb (82.6 kg)   SpO2 93%   BMI 24.01 kg/m  General: Alert, oriented, no distress.  Skin: normal turgor, no rashes, warm and dry HEENT: Normocephalic, atraumatic. Pupils equal round and reactive to light; sclera anicteric; extraocular muscles intact; Fundi ** Nose without nasal septal hypertrophy Mouth/Parynx benign; Mallinpatti scale Neck: No JVD, no carotid bruits; normal carotid upstroke Lungs: clear to ausculatation and percussion; no wheezing or rales Chest wall: without tenderness to palpitation Heart: PMI not  displaced, RRR, s1 s2 normal, 1/6 systolic murmur, no diastolic murmur, no rubs, gallops, thrills, or heaves Abdomen: soft, nontender; no hepatosplenomehaly, BS+; abdominal aorta nontender and not dilated by palpation. Back: no CVA tenderness Pulses 2+ Musculoskeletal: full range of motion, normal strength, no joint deformities Extremities: no clubbing cyanosis or edema, Homan's sign negative  Neurologic: grossly nonfocal; Cranial nerves grossly wnl Psychologic: Normal mood and affect    General: Alert, oriented, no distress.  Skin: normal turgor, no rashes, warm and dry HEENT: Normocephalic, atraumatic. Pupils equal round and reactive to light; sclera anicteric; extraocular muscles intact;  Nose without nasal septal hypertrophy Mouth/Parynx benign; Mallinpatti scale 3 Neck: No JVD, no carotid bruits; normal carotid upstroke Lungs: clear to ausculatation and percussion; no wheezing or rales Chest wall: without tenderness to palpitation Heart: PMI not displaced, RRR, s1 s2 normal, 1/6 systolic murmur, no diastolic murmur, no rubs, gallops, thrills, or heaves Abdomen: soft, nontender; no hepatosplenomehaly, BS+; abdominal aorta nontender and not dilated by palpation. Back: no CVA tenderness Pulses 2+ Musculoskeletal: full range of motion, normal strength, no joint deformities Extremities: 1+ bilateral lower extremity edema; no clubbing cyanosis, Homan's sign negative  Neurologic: grossly nonfocal; Cranial nerves grossly wnl Psychologic: Normal mood and affect  EKG Interpretation  Date/Time:  Monday December 13 2022 16:06:59 EDT Ventricular Rate:  127 PR Interval:  116 QRS Duration: 78 QT Interval:  316 QTC Calculation: 459 R Axis:   92 Text Interpretation: Sinus tachycardia Rightward axis Nonspecific ST abnormality Abnormal QRS-T angle, consider primary T wave abnormality Since last tracing rate faster Vent. rate has increased BY  48 BPM ST now depressed in Lateral leads Confirmed by  [name] ([id number]) on 12/13/2022 4:38:17 PM     September 28, 2022 ECG (independently read by me): Sinus tachycardia at 101, Right axis, NSST changes  May 26, 2022 ECG (independently read by me):  NSR at 63, NSST changes  September 11, 2021 ECG (independently read by me):  NSR at 67, no ectopy, normal intervas  May 19, 2021 ECG (independently read by me):  NSR at 71, no ectopy  August 19, 2020 ECG (independently read by me): Sinus rhythm at 67, sinus arrhythmia with low atrial atrial ectopy    September 2021 ECG (independently read by me): NSR 62; no STT changes; normal intervals  May 2021 ECG (independently read by me): NSR at 66; no ST changes; normal intervals  November 2020 ECG (independently read by me): Normal sinus rhythm at 75 bpm.  No ectopy.  Normal intervals  March 2019 ECG (independently read by me): Bradycardia 57 bpm.  No ectopy.  Normal intervals.  February 2018 ECG (independently read by me): Sinus bradycardia 57 bpm.  No ectopy.  Normal intervals.  January 2018 ECG (independently read by me): Sinus rhythm at 60 bpm.  No ectopy.  Normal intervals.  June 2017 ECG (independently read by me): Normal sinus rhythm at 61 bpm.  No ectopy.  Normal intervals.  April 2016 ECG (independently read by me): Normal sinus rhythm at 67 bpm.  No ectopy.  Normal intervals.  October 2014 ECG: Normal sinus rhythm at 63 beats per minute. No significant ST abnormalities. Normal intervals.  LABS:     Latest Ref Rng & Units 12/10/2022    6:58 AM 12/06/2022    5:54 AM 12/01/2022    5:48 AM  BMP  Glucose 70 - 99 mg/dL 79  82  409   BUN 8 - 23 mg/dL 34  31  41   Creatinine 0.61 - 1.24 mg/dL 8.11  9.14  7.82   Sodium 135 - 145 mmol/L 136  135  138   Potassium 3.5 - 5.1 mmol/L 4.8  3.9  4.3   Chloride 98 - 111 mmol/L 94  95  97   CO2 22 - 32 mmol/L 33  32  32   Calcium 8.9 - 10.3 mg/dL 9.4  8.7  8.9        Latest Ref Rng & Units 12/01/2022    5:48 AM 05/25/2022   11:21 AM 12/10/2021     8:58 AM  Hepatic Function  Total Protein 6.5 - 8.1 g/dL 5.8  6.8  7.3   Albumin 3.5 - 5.0 g/dL 3.1  3.9  4.1   AST 15 - 41 U/L 19  18  29    ALT 0 - 44 U/L 19  11  20    Alk Phosphatase 38 - 126 U/L 67  113  98   Total Bilirubin 0.3 - 1.2 mg/dL 0.7  0.9  1.3   Bilirubin, Direct 0.0 - 0.3 mg/dL  0.2        Latest Ref Rng & Units 12/06/2022    5:54 AM 12/01/2022    5:48 AM 11/30/2022    4:04 AM  CBC  WBC 4.0 - 10.5 K/uL 12.5  11.6  12.1   Hemoglobin 13.0 - 17.0 g/dL 40.9  81.1  91.4   Hematocrit 39.0 - 52.0 % 43.5  42.6  40.7   Platelets 150 - 400 K/uL 216  158  148    Lab Results  Component Value Date   MCV 92.8 12/06/2022   MCV 93.2 12/01/2022   MCV 92.5 11/30/2022    Lab Results  Component Value Date   TSH 1.040 08/25/2020   Lipid Panel     Component Value Date/Time   CHOL 119 08/25/2020 0754   TRIG 142 08/25/2020 0754   HDL 44 08/25/2020 0754   CHOLHDL 2.7 08/25/2020 0754   CHOLHDL 4.4 04/13/2018 0513   VLDL 27 04/13/2018 0513   LDLCALC 50 08/25/2020 0754     RADIOLOGY: No results found.    Other studies Reviewed:    LHC 04/12/2018: Conclusion      The left ventricular ejection fraction is 50-55% by visual estimate. The left ventricular systolic function is normal. LV end diastolic pressure is mildly elevated. LVEDP 16 mm Hg. There is no aortic valve stenosis. Previously placed Dist RCA-1 stent (unknown type) is widely patent. Dist RCA-2 lesion is 95% stenosed. RPDA lesion is 80% stenosed. Prox Cx lesion is 80% stenosed. Ost 1st Mrg to 1st Mrg lesion is 80% stenosed. Previously placed Mid LAD-1 stent (unknown type) is widely patent. Mid LAD-2 lesion is 75% stenosed. Ost 2nd Diag to 2nd Diag lesion is 75% stenosed. Dist LAD lesion is 75% stenosed. Ost 1st Diag lesion is 50% stenosed. Prox LAD lesion is 25% stenosed.   Multivessel CAD.  Plan for cardiac surgery consult.  He does have diffuse distal LAD disease which is not optimal for LIMA to  LAD.  If he is not a candidate for CABG, would plan to bring back for PCI.          LHC 04/14/2018: Conclusion      Mid RCA lesion is 65% stenosed. Previously placed Dist RCA-1 stent (unknown type) is widely patent. Dist RCA-2 lesion is 99% stenosed. Ost RPDA to RPDA lesion is 95% stenosed. Ost 1st Mrg to 1st Mrg lesion is 80% stenosed. Prox Cx lesion is 80% stenosed. Prox LAD to Mid LAD lesion is 10% stenosed. Mid LAD to Dist LAD lesion is 80% stenosed. Dist LAD lesion is 90% stenosed. Post intervention, there is a 20% residual stenosis. Post intervention, there is a 15% residual stenosis. Post intervention, there is a 0% residual stenosis. A stent was successfully placed.  Post intervention, there is a 0% residual stenosis. Post intervention, there is a 0% residual stenosis. Mid LAD lesion is 80% stenosed. A stent was successfully placed. Post intervention, there is a 0% residual stenosis. Post intervention, there is a 0% residual stenosis. A stent was successfully placed.   Successful multilesion/multivessel PCI to the RCA and LAD vessels.   Cutting Balloon/PTCA to the mid PDA vessel of the RCA with a 95% stenosis being reduced to 15%, and Cutting Balloon/PTCA/Munoz stenting with a 2.5 x 12 mm Resolute Onyx Munoz stent inserted in the distal RCA immediately proximal to the takeoff of the PDA vessel with stent overlap to the previously placed stent and postdilatation up to 2.75 mm with the 99% stenosis being reduced to 0%.   Three lesion intervention to the LAD with PTCA of the apical 90% LAD stenosis reduced to less than 30%, PTCA/Munoz stenting with a 3.0 x 22 mm Resolute Onyx Munoz stent postdilated to 3.25 mm with the 80% stenosis being reduced to 0% in the 80% stenosis just distal to the old LAD stent treated with PTCA/Munoz stenting with a 3.0 x 18 mm Resolute Onyx Munoz stent postdilated 3.3 mm with the 80% stenosis being reduced to 0%.   LVEDP preintervention was 15 mmHg.    RECOMMENDATION: The patient will be being maintained on dual antiplatelet therapy probably indefinitely due to significant multi-vessel disease and  intervention.  Increase medical therapy for concomitant CAD.  Patient will be hydrated post procedure.  Consider discharge over the weekend on increased medical therapy.  If recurrent symptoms develop consider staged PCI to his circumflex marginal and AV groove circumflex vessels.  __________    IMPRESSION:  1. Hospital discharge follow-up      ASSESSMENT AND PLAN: Maurice Munoz is a 77 year-old Caucasian male who is almost 26 years since his initial intervention to his RCA and PDA and 18 years since stenting to his LAD and distal RCA.  He has a history of hypertension.   Due to his significant occupational exposure, remote tobacco, and abnormal PFTs on CT imaging he was  felt most likely to have interstitial lung disease .  In 2019 he developed recurrent unstable anginal symptomatology and was found to have progressive multivessel CAD.  He was turned down for CABG revascularization.  He underwent complex multi lesion multivessel PCI by me on April 14, 2018 at several sites in his RCA and LAD.  At the time he also had concomitant circumflex and circumflex and marginal disease which was not intervened upon.  He had felt significantly improved with reference to symptoms but when evaluated in November 2022 he had recurrent chest pain and increased shortness of breath.  He is now followed by Dr. Isaiah Serge of pulmonary for pulmonary fibrosis and is on Esbriet.  He has lost approximately 60 pounds of weight since being on Esbriet with appetite suppression and periods of nausea and fatigability  A follow-up echo Doppler study in December 2022 showed  EF was 60 to 65% with  improvement in his pulmonary hypertension with normal pulmonary artery systolic pressure.  His most recent echo Doppler study from June 30, 2022 shows normal LV function with EF 60 to 65% with  mild LVH and grade 1 diastolic dysfunction.  Pulmonary pressure has significantly increased with estimated systolic pressure at 58.4 mm.  When seen by Dr. Isaiah Serge, it was recommended he undergo right heart catheterization.  The patient also has experienced an episode of chest burning on September 08, 2022.  He has noticed some increasing shortness of breath.  He is on supplemental oxygen at 4 to 5 L.  Has continued to be on atorvastatin 80 mg for hyperlipidemia with most recent lipid studies showing LDL cholesterol at 56.  With his concomitant CAD and recent chest pain rather than just perform right heart catheterization I have recommended definitive right and left heart cardiac catheterization.  He continues to be on amlodipine 5 mg, isosorbide 60 mg, lisinopril 2.5 mg, metoprolol tartrate 50 mg twice a day.  Resting pulse today is elevated at 101.  I have recommended further titration of metoprolol to tartrate to 75 mg twice a day.  I also have recommended that he take isosorbide 60 mg in the morning and at 30 mg at night.  I discussed the cardiac catheterization procedure in detail.  We had shared decision making.  The risks and benefits of a cardiac catheterization including, but not limited to, death, stroke, MI, kidney damage and bleeding were discussed with the patient who indicates understanding and agrees to proceed.  Will tentatively set this catheterization date for October 18, 2022 and he will have follow-up laboratory prior to the catheterization study.   Lennette Bihari, MD, [facility]  12/13/2022 4:35 PM

## 2022-12-16 ENCOUNTER — Encounter: Payer: Self-pay | Admitting: Cardiovascular Disease

## 2022-12-24 ENCOUNTER — Other Ambulatory Visit: Payer: Self-pay | Admitting: Cardiovascular Disease

## 2023-01-03 ENCOUNTER — Other Ambulatory Visit: Payer: Self-pay | Admitting: Pulmonary Disease

## 2023-01-05 ENCOUNTER — Encounter: Payer: Self-pay | Admitting: Pulmonary Disease

## 2023-01-05 ENCOUNTER — Ambulatory Visit: Payer: Medicare Other | Admitting: Pulmonary Disease

## 2023-01-05 VITALS — BP 110/66 | HR 99 | Ht 73.0 in | Wt 185.0 lb

## 2023-01-05 DIAGNOSIS — Z5181 Encounter for therapeutic drug level monitoring: Secondary | ICD-10-CM | POA: Diagnosis not present

## 2023-01-05 DIAGNOSIS — J84112 Idiopathic pulmonary fibrosis: Secondary | ICD-10-CM | POA: Diagnosis not present

## 2023-01-05 NOTE — Progress Notes (Signed)
Maurice Munoz    [id number]    Apr 30, 1946  Primary Care Physician:Fusco, Lyman Bishop, MD  Referring Physician: [name], MD [address] [place],  [place] [address]  Chief complaint: Follow-up for Idiopathic pulmonary fibrosis Maurice Munoz January 2021  HPI: 77 y.o.  with history of coronary artery disease, pulmonary fibrosis.  Previously followed by Dr. Kendrick Fries.  Prior CT scan shows pulmonary fibrosis which is thought to be secondary to occupational exposure Referred back for worsening dyspnea Discussed at multidisciplinary conference on 07/03/19 with diagnosis of IPF and started on Esbriet  Patient has established coronary artery disease status post multiple stenting in 2019.  He also has residual circumflex disease that was not intervened upon.  Pets: Dog.  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  No ongoing exposures.  No mold, hot tub, Jacuzzi or down pillows or comforters Smoking history: 75-pack-year smoker.  Quit in 1990 Travel history: No significant travel history Relevant family history: Father died of lung cancer.  He was a smoker.  Interim history: Continues on Esbriet.  He takes just 2 pills a day on most days since he skips lunch Continues on supplemental oxygen.  Breathing is stable  Had a right heart cath in April which showed severe pulmonary arterial hypertension.  Hospitalized on 11/26/2022 for syncope, dyspnea.  He has a right ICA occlusion and CTA PE protocol is negative for PE, he did have pulmonary edema and cardiac overload.  He was diuresed with improvement and given Solu-Medrol for 3 days.  Outpatient Encounter Medications as of 01/05/2023  Medication Sig   acetaminophen (TYLENOL) 325 MG tablet Take 2 tablets (650 mg total) by mouth 4 (four) times daily -  before meals and at bedtime.   allopurinol  (ZYLOPRIM) 300 MG tablet Take 300 mg by mouth in the morning.   aspirin EC 81 MG tablet Take 81 mg by mouth in the morning.   atorvastatin (LIPITOR) 80 MG tablet TAKE ONE TABLET (80MG  TOTAL) BY MOUTH AT6PM (Patient taking differently: Take 80 mg by mouth daily.)   clopidogrel (PLAVIX) 75 MG tablet TAKE ONE (1) TABLET BY MOUTH EVERY DAY   cyanocobalamin (VITAMIN B12) 1000 MCG tablet Take 1,000 mcg by mouth in the morning.   ESBRIET 801 MG TABS Take 1 tablet (801 mg total) by mouth 3 (three) times daily with meals. (Patient taking differently: Take 1 tablet by mouth See admin instructions. Take 1 tablet (801 mg) by mouth up to 3 times daily with meals.)   furosemide (LASIX) 20 MG tablet Take 1/2 tablet (10 mg total) by mouth daily.   isosorbide mononitrate (IMDUR) 30 MG 24 hr tablet Take 15 mg by mouth daily.   metoprolol succinate (TOPROL-XL) 25 MG 24 hr tablet Take one tablet in the morning and half a tablet at night   nitroGLYCERIN (NITROSTAT) 0.4 MG SL tablet Place 1 tablet (0.4 mg total) under the tongue every 5 (five) minutes as needed for chest pain.   prochlorperazine (COMPAZINE) 10 MG tablet TAKE ONE TABLET (10MG  TOTAL) BY MOUTH EVERY SIX HOURS AS NEEDED FOR NAUSEA OR VOMITING   tiZANidine (ZANAFLEX) 4 MG tablet Take 4 mg by mouth at bedtime.    VITAMIN D PO Take 5,000 Units by mouth in the morning.   [DISCONTINUED] isosorbide mononitrate (IMDUR) 30 MG 24 hr tablet Take 1 tablet (30 mg  total) by mouth every morning. (Patient taking differently: Take 60 mg by mouth every morning.)   No facility-administered encounter medications on file as of 01/05/2023.   Physical Exam: Blood pressure 110/66, pulse 99, height 6\' 1"  (1.854 m), weight 185 lb (83.9 kg), SpO2 92%. Gen:      No acute distress HEENT:  EOMI, sclera anicteric Neck:     No masses; no thyromegaly Lungs:    Bibasal crackles CV:         Regular rate and rhythm; no murmurs Abd:      + bowel sounds; soft, non-tender; no palpable  masses, no distension Ext:    No edema; adequate peripheral perfusion Skin:      Warm and dry; no rash Neuro: alert and oriented x 3 Psych: normal mood and affect   Data Reviewed: Imaging: CT high-resolution February 05, 2015 patchy areas of groundglass attenuation, septal thickening with mild basal gradient. CT chest high-resolution 05/09/2016-subpleural reticulation, mild traction bronchiolectasis, mild honeycombing.  Definite UIP pattern high-resolution  CT chest high-resolution 04/24/2019-progression of fibrotic lung lung disease and definite UIP pattern. CT high-resolution 08/20/2021-progression of UIP pattern pulmonary fibrosis. CT high-resolution 08/31/2022-possible mild progression of UIP pattern interstitial lung disease, enlarged PA I have reviewed the images personally.  PFTs: 04/29/2017 FVC 2.66 [55%], FEV1 2.35 [66%], F/F 88, TLC 4.10 [55%], DLCO 15.60 [44%] Moderate restriction with severe diffusion defect.  Worsening lung function compared to 2016.  05/28/2019 FVC 2.34 [49%], FEV1 1.96 [56%], F/F 84, TLC 3.50 [47%], DLCO 12.72 [46%] Severe restriction, diffusion defect.  Worsening lung volumes and diffusion capacity compared to 2008  09/19/2020 FVC 2.18 [46%), FEV1 1.87 (54%], F/F 86, TLC 3.33 [44%], DLCO 12.05 [44%] Severe restriction, diffusion defect with worsening  06/23/2021 FVC 2.18 [46%], FEV1 1.54 [45%], F/F 71, TLC 3.53 [47%], DLCO 6.05 [22%] Severe restriction, severe diffusion defect  Labs: CTD serologies 02/23/2017-negative ANA, Jo 1, centromere antibody, rheumatoid factor, Ro, La, SCL 70  Repeat CTD serologies-negative ANA, CCP CK, aldolase, myositis panel, SCL 70, Ro, La Rheumatoid factor 16  Hepatic panel 08/25/2020-within normal limits  Cardiac: Echocardiogram 06/10/2021 LVEF 60 to 65%, mild concentric LVH, normal PA systolic pressure.  RVSP 31.4  Echocardiogram 06/30/2022 LVEF 60 to 65%, grade 1 diastolic dysfunction, mildly reduced RV systolic function.   Moderate pulmonary hypertension, RVSP 58.4  Assessment:  Idiopathic pulmonary fibrosis He has 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  Although there is some occupational exposures he has not had ongoing exposure since 2000.  He does not have signs and symptoms of connective tissue disease.  CTD serologies significant for borderline rheumatoid factor which is nonsignificant  Discussed at multidisciplinary conference with a diagnosis of IPF Continue Esbriet at a reduced dose of twice daily.  Has occasional nausea and fatigue with the med We discussed changing to Ofev but he does not want to try it due to side effects of diarrhea Zofran as needed Reviewed labs from his nephrologist.  Severe pulm hypertension Likely combination of Group 1 and group 3 Start inhaled Tyvaso   Plan/Recommendations: - Continue esbriet.   - Monitor Labs - Start Prilosec   Chilton Greathouse MD [facility] 01/05/2023, 2:22 PM  CC: [name], MD

## 2023-01-05 NOTE — Patient Instructions (Signed)
Continue the Esbriet. Will start you on a medication called Tyvaso for the scarring in your lung Follow-up in 3 months.

## 2023-01-06 ENCOUNTER — Other Ambulatory Visit (HOSPITAL_COMMUNITY): Payer: Self-pay

## 2023-01-06 ENCOUNTER — Telehealth: Payer: Self-pay | Admitting: Pharmacist

## 2023-01-06 NOTE — Telephone Encounter (Signed)
Received new start paperwork for Maurice Munoz. Routing to Dr. Isaiah Munoz for clarification on if he prefers DPI or neb solution for patient. Neb solution is easier to adjust titration by breath count for patients with baseline SOB or difficulty breathing. DPI device is easier to use and transport.  Chesley Mires, PharmD, MPH, BCPS, CPP Clinical Pharmacist (Rheumatology and Pulmonology)

## 2023-01-06 NOTE — Telephone Encounter (Signed)
Can try nebs. He is not very mobile at baseline. Thanks

## 2023-01-10 NOTE — Telephone Encounter (Signed)
Patient has Medicare + AARP supplement. Tyvaso nebs will be covered 100% once Part B deductible is met.  Submitted completed referral paperwork to [facility] for TYVASO along with HRCT, right heart cath results, and most recent progress note.  Fax# [phone] Phone# [phone]  Chesley Mires, PharmD, MPH, BCPS, CPP Clinical Pharmacist (Rheumatology and Pulmonology)

## 2023-01-11 ENCOUNTER — Encounter
Payer: Medicare Other | Attending: Physical Medicine and Rehabilitation | Admitting: Physical Medicine and Rehabilitation

## 2023-01-11 ENCOUNTER — Encounter: Payer: Self-pay | Admitting: Physical Medicine and Rehabilitation

## 2023-01-11 VITALS — BP 115/74 | HR 77 | Ht 73.0 in

## 2023-01-11 DIAGNOSIS — M25561 Pain in right knee: Secondary | ICD-10-CM | POA: Insufficient documentation

## 2023-01-11 DIAGNOSIS — Z7409 Other reduced mobility: Secondary | ICD-10-CM | POA: Insufficient documentation

## 2023-01-11 DIAGNOSIS — Z789 Other specified health status: Secondary | ICD-10-CM | POA: Diagnosis present

## 2023-01-11 DIAGNOSIS — G8929 Other chronic pain: Secondary | ICD-10-CM | POA: Insufficient documentation

## 2023-01-11 DIAGNOSIS — M25562 Pain in left knee: Secondary | ICD-10-CM | POA: Insufficient documentation

## 2023-01-11 DIAGNOSIS — R5381 Other malaise: Secondary | ICD-10-CM | POA: Diagnosis present

## 2023-01-11 DIAGNOSIS — R262 Difficulty in walking, not elsewhere classified: Secondary | ICD-10-CM | POA: Diagnosis present

## 2023-01-11 NOTE — Progress Notes (Signed)
Subjective:    Patient ID: Maurice Munoz, male    DOB: 10-11-1945, 77 y.o.   MRN: [id number]  HPI 1) Pain in bilateral knees: -this was negatively affecting his walking -doing home exercises and this helped  2) Impaired ambulation: -home therapy was not helpful -patient says he only asked patient to stand and walk in his rooms -using power wheelchair at home  3) Impaired ADLs: -his home OT was helpful  4) Debility: -his brother has been driving him  Pain Inventory Average Pain 5 Pain Right Now 4 My pain is intermittent, sharp, burning, and dull  In the last 24 hours, has pain interfered with the following? General activity 8 Relation with others 0 Enjoyment of life 6 What TIME of day is your pain at its worst? morning , daytime, evening, and night Sleep (in general) Good  Pain is worse with: walking, bending, and standing Pain improves with:  Relief from Meds: 2  use a walker ability to climb steps?  yes do you drive?  no  retired I need assistance with the following:  meal prep and household duties Do you have any goals in this area?  yes  trouble walking  Any changes since last visit?  no  Any changes since last visit?  yes    Family History  Problem Relation Age of Onset   Cancer Father    Heart attack Brother    Diabetes Brother    Colon cancer Neg Hx    Social History   Socioeconomic History   Marital status: Widowed    Spouse name: Not on file   Number of children: Not on file   Years of education: Not on file   Highest education level: Not on file  Occupational History   Not on file  Tobacco Use   Smoking status: Former    Current packs/day: 0.00    Average packs/day: 3.0 packs/day for 25.0 years (75.0 ttl pk-yrs)    Types: Cigarettes    Start date: 11/28/1963    Quit date: 11/27/1988    Years since quitting: 34.1   Smokeless tobacco: Former    Quit date: 03/28/1989  Vaping Use   Vaping status: Never Used  Substance and Sexual  Activity   Alcohol use: No   Drug use: No   Sexual activity: Not on file  Other Topics Concern   Not on file  Social History Narrative   Previously used used to work on a farm   Currently retired secondary to chronic back pains-   Used to be a Programmer, multimedia   Married to his wife and lives in [place]   Father died at age 45 lung cancer   Mother had CAD   Wife had cancer   Social Determinants of Health   Financial Resource Strain: Not on file  Food Insecurity: No Food Insecurity (11/27/2022)   Hunger Vital Sign    Worried About Running Out of Food in the Last Year: Never true    Ran Out of Food in the Last Year: Never true  Transportation Needs: No Transportation Needs (11/27/2022)   PRAPARE - Administrator, Civil Service (Medical): No    Lack of Transportation (Non-Medical): No  Physical Activity: Not on file  Stress: Not on file  Social Connections: Not on file   Past Surgical History:  Procedure Laterality Date   ANTERIOR CERVICAL DECOMP/DISCECTOMY FUSION  12-25-2001     dr Terrilee Files [facility]  C3 -- C7   ANTERIOR CERVICAL DECOMP/DISCECTOMY FUSION  12-11-2012   dr Channing Mutters  [facility])   CARDIOVASCULAR STRESS TEST  08-23-2012    dr Tresa Endo   normal lexiscan nuclear study w/ no ischemia/  normal LV function and wall motion, ef 65%   CARDIOVASCULAR STRESS TEST     CORONARY ANGIOPLASTY  1999   PTCA to RCA & PDA   CORONARY ANGIOPLASTY WITH STENT PLACEMENT  10-11-2005  dr [name]   PCI and stenting to midLAD & dRCA (Cypher DES x2)   CORONARY BALLOON ANGIOPLASTY N/A 04/14/2018   Procedure: CORONARY BALLOON ANGIOPLASTY;  Surgeon: Lennette Bihari, MD;  Location: [facility] INVASIVE CV LAB;  Service: Cardiovascular;  Laterality: N/A;   CORONARY STENT INTERVENTION N/A 04/14/2018   Procedure: CORONARY STENT INTERVENTION;  Surgeon: Lennette Bihari, MD;  Location: [facility] INVASIVE CV LAB;  Service: Cardiovascular;  Laterality: N/A;   CYSTOSCOPY W/  URETERAL STENT PLACEMENT Left 02-11-2010     [facility]   LEFT HEART CATH N/A 04/14/2018   Procedure: Left Heart Cath;  Surgeon: Lennette Bihari, MD;  Location: [facility] INVASIVE CV LAB;  Service: Cardiovascular;  Laterality: N/A;   LEFT HEART CATH AND CORONARY ANGIOGRAPHY N/A 04/12/2018   Procedure: LEFT HEART CATH AND CORONARY ANGIOGRAPHY;  Surgeon: Corky Crafts, MD;  Location: [facility] INVASIVE CV LAB;  Service: Cardiovascular;  Laterality: N/A;   LUMBAR LAMINECTOMY  2015   L5 -- S1   PILONIDAL CYST EXCISION  1980s   PILONIDAL CYST EXCISION N/A 03/31/2017   Procedure: EXCISION OF PILONIDAL DISEASE with Flap Rotation;  Surgeon: Romie Levee, MD;  Location: [facility];  Service: General;  Laterality: N/A;   RIGHT/LEFT HEART CATH AND CORONARY ANGIOGRAPHY N/A 10/18/2022   Procedure: RIGHT/LEFT HEART CATH AND CORONARY ANGIOGRAPHY;  Surgeon: Lennette Bihari, MD;  Location: [facility] INVASIVE CV LAB;  Service: Cardiovascular;  Laterality: N/A;   THROAT SURGERY  1996   per pt "removal piece of cryloid(?) that was pressing against throat"  states no issues since   TRANSTHORACIC ECHOCARDIOGRAM  08-24-2010  dr Tresa Endo   ef 50-55%/  mild MR/  trivial TR   WOUND DEBRIDEMENT N/A 10/06/2017   Procedure: DEBRIDEMENT WOUND PLACEMENT OF WOUND HEALING MATRIX;  Surgeon: Romie Levee, MD;  Location: [facility];  Service: General;  Laterality: N/A;   Past Medical History:  Diagnosis Date   Adrenal adenoma, left    small   Aortic atherosclerosis (HCC)    Back pain    Bilateral carotid artery stenosis followed by dr Tresa Endo   per last carotid duplex 07-17-2014  -- proximal LICA 50-69% ,  RICA 0-49%   CAD (coronary artery disease)    a. s/p prior intervention to RCA and PDA in 1999 b. DES to LAD and distal RCA in 2007   Cardiomegaly    Stable   Chronic constipation    Chronic pain syndrome    CKD (chronic kidney disease), stage III (HCC)    DDD (degenerative disc disease), cervical    DDD  (degenerative disc disease), lumbosacral    Dyspnea    Dyspnea on exertion    Gout    per pt last inflared episode 2015 approx.   Grade II diastolic dysfunction    H/O epistaxis    per pt sees ENT dr Suszanne Conners for cauterization (pt takes plavix)   History of acute pancreatitis 08/08/2014   History of kidney stones    HTN (hypertension)    Hyperlipidemia  Interstitial lung disease (HCC)    Left arm pain    s/p fall   Nocturia    Numbness of right foot    due to DDD lumbar   Peripheral edema    Pilonidal cyst    Pleural effusion on right    Pulmonary fibrosis (HCC)    followed by pcp-- last chest CT 04-08-2016   S/P drug eluting coronary stent placement 10/11/2005   mLAD and dRCA   Wears dentures    upper   Wears glasses    BP 115/74   Pulse 77   Ht 6\' 1"  (1.854 m)   SpO2 90% Comment: 4 liters O2  BMI 24.41 kg/m   Opioid Risk Score:   Fall Risk Score:  `1  Depression screen [facility] 2/9     01/11/2023   10:20 AM  Depression screen PHQ 2/9  Decreased Interest 3  Down, Depressed, Hopeless 0  PHQ - 2 Score 3  Altered sleeping 0  Tired, decreased energy 1  Change in appetite 1  Feeling bad or failure about yourself  0  Trouble concentrating 0  Moving slowly or fidgety/restless 0  Suicidal thoughts 0  PHQ-9 Score 5    Review of Systems  Respiratory:  Positive for shortness of breath.   Musculoskeletal:  Positive for back pain and gait problem.       Pain in both knee & pain in the left shoulder  All other systems reviewed and are negative.      Objective:   Physical Exam  Gen: no distress, normal appearing HEENT: oral mucosa pink and moist, NCAT, oxygen in place Cardio: Reg rate Chest: normal effort, normal rate of breathing Abd: soft, non-distended Ext: no edema Psych: pleasant, normal affect Skin: intact Neuro: Alert and oriented x3 Musculoskeletal: bilateral knee tender to palpation      Assessment & Plan:   1) Chronic pain in bilateral  knees: -continue HEP -d/c volataren gel  2) Impaired ambulation:  -continue HEP  3) Impaired ADLs -continue HEP  4) Debility RETURN TO DRIVING PLAN:  WITH THE SUPERVISION OF A LICENSED DRIVER, PLEASE DRIVE IN AN EMPTY PARKING LOT FOR AT LEAST 2-3 TRIALS TO TEST REACTION TIME, VISION, USE OF EQUIPMENT IN CAR, ETC.  IF SUCCESSFUL WITH THE PARKING LOT DRIVING, PROCEED TO SUPERVISED DRIVING TRIALS IN YOUR NEIGHBORHOOD STREETS AT LOW TRAFFIC TIMES TO TEST OBSERVATION TO TRAFFIC SIGNALS, REACTION TIME, ETC. PLEASE ATTEMPT AT LEAST 2-3 TRIALS IN YOUR NEIGHBORHOOD.  IF NEIGHBORHOOD DRIVING IS SUCCESSFUL, YOU MAY PROCEED TO DRIVING IN BUSIER AREAS IN YOUR COMMUNITY WITH SUPERVISION OF A LICENSED DRIVER. PLEASE ATTEMPT AT LEAST 4-5 TRIALS.  Discussed his support from his brother

## 2023-01-12 NOTE — Telephone Encounter (Signed)
Received email from [facility] rep regarding Tyvaso neb referral - they have received all documentation needed for the referral. Referral pending nursing contact with the patient to discuss referral then we will move forward with consent.

## 2023-01-24 NOTE — Telephone Encounter (Signed)
Per [facility] portal, Tyvaso neb referral is still pending. They will be making outreach to patient regarding copay  Chesley Mires, PharmD, MPH, BCPS, CPP Clinical Pharmacist (Rheumatology and Pulmonology)

## 2023-02-01 NOTE — Telephone Encounter (Signed)
Per [facility], Tyvaso is scheduled to deliver to patient on 02/02/2023

## 2023-02-06 ENCOUNTER — Emergency Department (HOSPITAL_COMMUNITY)
Admission: EM | Admit: 2023-02-06 | Discharge: 2023-02-20 | Disposition: E | Payer: Medicare Other | Attending: Pulmonary Disease | Admitting: Pulmonary Disease

## 2023-02-06 ENCOUNTER — Encounter (HOSPITAL_COMMUNITY): Payer: Self-pay | Admitting: Emergency Medicine

## 2023-02-06 ENCOUNTER — Emergency Department (HOSPITAL_COMMUNITY): Payer: Medicare Other

## 2023-02-06 ENCOUNTER — Other Ambulatory Visit: Payer: Self-pay

## 2023-02-06 DIAGNOSIS — Z955 Presence of coronary angioplasty implant and graft: Secondary | ICD-10-CM | POA: Diagnosis not present

## 2023-02-06 DIAGNOSIS — Z8774 Personal history of (corrected) congenital malformations of heart and circulatory system: Secondary | ICD-10-CM | POA: Diagnosis not present

## 2023-02-06 DIAGNOSIS — J9601 Acute respiratory failure with hypoxia: Principal | ICD-10-CM | POA: Insufficient documentation

## 2023-02-06 DIAGNOSIS — Z87891 Personal history of nicotine dependence: Secondary | ICD-10-CM | POA: Insufficient documentation

## 2023-02-06 DIAGNOSIS — I2510 Atherosclerotic heart disease of native coronary artery without angina pectoris: Secondary | ICD-10-CM | POA: Diagnosis not present

## 2023-02-06 DIAGNOSIS — I5032 Chronic diastolic (congestive) heart failure: Secondary | ICD-10-CM | POA: Insufficient documentation

## 2023-02-06 DIAGNOSIS — I130 Hypertensive heart and chronic kidney disease with heart failure and stage 1 through stage 4 chronic kidney disease, or unspecified chronic kidney disease: Secondary | ICD-10-CM | POA: Insufficient documentation

## 2023-02-06 DIAGNOSIS — E669 Obesity, unspecified: Secondary | ICD-10-CM | POA: Diagnosis not present

## 2023-02-06 DIAGNOSIS — I469 Cardiac arrest, cause unspecified: Secondary | ICD-10-CM | POA: Diagnosis present

## 2023-02-06 LAB — BLOOD GAS, ARTERIAL
Acid-base deficit: 8.6 mmol/L — ABNORMAL HIGH (ref 0.0–2.0)
Bicarbonate: 19.9 mmol/L — ABNORMAL LOW (ref 20.0–28.0)
Drawn by: 27016
FIO2: 60 %
O2 Saturation: 94.9 %
Patient temperature: 37
pCO2 arterial: 52 mmHg — ABNORMAL HIGH (ref 32–48)
pH, Arterial: 7.19 — CL (ref 7.35–7.45)
pO2, Arterial: 76 mmHg — ABNORMAL LOW (ref 83–108)

## 2023-02-06 LAB — CBG MONITORING, ED: Glucose-Capillary: 169 mg/dL — ABNORMAL HIGH (ref 70–99)

## 2023-02-06 LAB — CBC WITH DIFFERENTIAL/PLATELET
Abs Immature Granulocytes: 0.32 10*3/uL — ABNORMAL HIGH (ref 0.00–0.07)
Basophils Absolute: 0.1 10*3/uL (ref 0.0–0.1)
Basophils Relative: 1 %
Eosinophils Absolute: 0.2 10*3/uL (ref 0.0–0.5)
Eosinophils Relative: 1 %
HCT: 48.1 % (ref 39.0–52.0)
Hemoglobin: 14.8 g/dL (ref 13.0–17.0)
Immature Granulocytes: 2 %
Lymphocytes Relative: 31 %
Lymphs Abs: 5.1 10*3/uL — ABNORMAL HIGH (ref 0.7–4.0)
MCH: 30.5 pg (ref 26.0–34.0)
MCHC: 30.8 g/dL (ref 30.0–36.0)
MCV: 99 fL (ref 80.0–100.0)
Monocytes Absolute: 1 10*3/uL (ref 0.1–1.0)
Monocytes Relative: 6 %
Neutro Abs: 9.5 10*3/uL — ABNORMAL HIGH (ref 1.7–7.7)
Neutrophils Relative %: 59 %
Platelets: 312 10*3/uL (ref 150–400)
RBC: 4.86 MIL/uL (ref 4.22–5.81)
RDW: 14.1 % (ref 11.5–15.5)
WBC: 16.2 10*3/uL — ABNORMAL HIGH (ref 4.0–10.5)
nRBC: 0.1 % (ref 0.0–0.2)

## 2023-02-06 LAB — COMPREHENSIVE METABOLIC PANEL
ALT: 28 U/L (ref 0–44)
AST: 42 U/L — ABNORMAL HIGH (ref 15–41)
Albumin: 3.2 g/dL — ABNORMAL LOW (ref 3.5–5.0)
Alkaline Phosphatase: 92 U/L (ref 38–126)
Anion gap: 19 — ABNORMAL HIGH (ref 5–15)
BUN: 23 mg/dL (ref 8–23)
CO2: 18 mmol/L — ABNORMAL LOW (ref 22–32)
Calcium: 8.8 mg/dL — ABNORMAL LOW (ref 8.9–10.3)
Chloride: 99 mmol/L (ref 98–111)
Creatinine, Ser: 1.45 mg/dL — ABNORMAL HIGH (ref 0.61–1.24)
GFR, Estimated: 50 mL/min — ABNORMAL LOW (ref >60)
Glucose, Bld: 197 mg/dL — ABNORMAL HIGH (ref 70–99)
Potassium: 3.1 mmol/L — ABNORMAL LOW (ref 3.5–5.1)
Sodium: 136 mmol/L (ref 135–145)
Total Bilirubin: 0.9 mg/dL (ref 0.3–1.2)
Total Protein: 6.2 g/dL — ABNORMAL LOW (ref 6.5–8.1)

## 2023-02-06 LAB — BRAIN NATRIURETIC PEPTIDE: B Natriuretic Peptide: 2524 pg/mL — ABNORMAL HIGH (ref 0.0–100.0)

## 2023-02-06 LAB — I-STAT CHEM 8, ED
BUN: 24 mg/dL — ABNORMAL HIGH (ref 8–23)
Calcium, Ion: 1.17 mmol/L (ref 1.15–1.40)
Chloride: 102 mmol/L (ref 98–111)
Creatinine, Ser: 1.5 mg/dL — ABNORMAL HIGH (ref 0.61–1.24)
Glucose, Bld: 185 mg/dL — ABNORMAL HIGH (ref 70–99)
HCT: 49 % (ref 39.0–52.0)
Hemoglobin: 16.7 g/dL (ref 13.0–17.0)
Potassium: 3.1 mmol/L — ABNORMAL LOW (ref 3.5–5.1)
Sodium: 140 mmol/L (ref 135–145)
TCO2: 20 mmol/L — ABNORMAL LOW (ref 22–32)

## 2023-02-06 LAB — TROPONIN I (HIGH SENSITIVITY): Troponin I (High Sensitivity): 36 ng/L — ABNORMAL HIGH (ref <18)

## 2023-02-06 LAB — LACTIC ACID, PLASMA: Lactic Acid, Venous: >9 mmol/L (ref 0.5–1.9)

## 2023-02-06 MED ORDER — NOREPINEPHRINE 4 MG/250ML-% IV SOLN
2.0000 ug/min | INTRAVENOUS | Status: DC
  Administered 2023-02-06: 4 ug/min via INTRAVENOUS
  Filled 2023-02-06: qty 250

## 2023-02-06 MED ORDER — SODIUM CHLORIDE 0.9 % IV SOLN: 250.0000 mL | INTRAVENOUS | Status: DC

## 2023-02-06 MED ORDER — AMIODARONE IV BOLUS ONLY 150 MG/100ML
INTRAVENOUS | Status: AC
  Filled 2023-02-06: qty 100

## 2023-02-06 MED ORDER — MIDAZOLAM HCL (PF) 10 MG/2ML IJ SOLN
INTRAMUSCULAR | Status: AC
  Filled 2023-02-06: qty 2

## 2023-02-06 MED ORDER — AMIODARONE HCL IN DEXTROSE 360-4.14 MG/200ML-% IV SOLN: 60.0000 mg/h | INTRAVENOUS | Status: DC

## 2023-02-06 MED ORDER — EPINEPHRINE HCL 5 MG/250ML IV SOLN IN NS
0.5000 ug/min | INTRAVENOUS | Status: DC
  Administered 2023-02-06: 20 ug/min via INTRAVENOUS

## 2023-02-06 MED ORDER — SODIUM CHLORIDE 0.9 % IV SOLN
1.0000 g | Freq: Once | INTRAVENOUS | Status: DC
  Filled 2023-02-06: qty 10

## 2023-02-06 MED ORDER — POTASSIUM CHLORIDE 10 MEQ/100ML IV SOLN
10.0000 meq | INTRAVENOUS | Status: AC
  Filled 2023-02-06 (×2): qty 100

## 2023-02-06 MED ORDER — ROCURONIUM BROMIDE 50 MG/5ML IV SOLN
90.0000 mg | Freq: Once | INTRAVENOUS | Status: AC
  Administered 2023-02-06: 90 mg via INTRAVENOUS
  Filled 2023-02-06: qty 9

## 2023-02-06 MED ORDER — ETOMIDATE 2 MG/ML IV SOLN
20.0000 mg | Freq: Once | INTRAVENOUS | Status: AC
  Administered 2023-02-06: 20 mg via INTRAVENOUS

## 2023-02-06 MED ORDER — SODIUM CHLORIDE 0.9 % IV SOLN
500.0000 mg | Freq: Once | INTRAVENOUS | Status: DC
  Filled 2023-02-06: qty 5

## 2023-02-06 MED ORDER — EPINEPHRINE 1 MG/10ML IJ SOSY
PREFILLED_SYRINGE | INTRAMUSCULAR | Status: DC | PRN
Start: 2023-02-06 — End: 2023-02-06
  Administered 2023-02-06: 1 mg via INTRAVENOUS

## 2023-02-06 MED ORDER — PROPOFOL 1000 MG/100ML IV EMUL: 5.0000 ug/kg/min | INTRAVENOUS | Status: DC

## 2023-02-06 MED ORDER — AMIODARONE HCL IN DEXTROSE 360-4.14 MG/200ML-% IV SOLN: 30.0000 mg/h | INTRAVENOUS | Status: DC

## 2023-02-06 MED ORDER — AMIODARONE HCL IN DEXTROSE 360-4.14 MG/200ML-% IV SOLN
INTRAVENOUS | Status: AC
  Administered 2023-02-06: 60 mg/h via INTRAVENOUS
  Filled 2023-02-06: qty 200

## 2023-02-06 MED ORDER — MIDAZOLAM HCL 2 MG/2ML IJ SOLN: 1.0000 mg | INTRAMUSCULAR | Status: DC | PRN

## 2023-02-06 MED ORDER — SODIUM CHLORIDE 0.9 % IV BOLUS
1000.0000 mL | Freq: Once | INTRAVENOUS | Status: AC
  Administered 2023-02-06: 1000 mL via INTRAVENOUS

## 2023-02-06 MED ORDER — PROPOFOL 1000 MG/100ML IV EMUL
INTRAVENOUS | Status: AC
  Administered 2023-02-06: 5 ug/kg/min via INTRAVENOUS
  Filled 2023-02-06: qty 100

## 2023-02-11 LAB — CULTURE, BLOOD (ROUTINE X 2)
Culture: NO GROWTH
Special Requests: ADEQUATE

## 2023-02-14 ENCOUNTER — Ambulatory Visit: Payer: Medicare Other | Admitting: Adult Health

## 2023-02-20 NOTE — ED Notes (Signed)
1350 0.1 mg epi given by DR.Gray  1352 0.1 mg given by Dr..Gray

## 2023-02-20 NOTE — ED Notes (Signed)
Pulse check performed. PT in ST. 1 mg epi given. MD remains at bedside

## 2023-02-20 NOTE — ED Notes (Signed)
Pt PM care finalized per facility protocol.  Security and tech wheeled pt downstairs to the morgue.

## 2023-02-20 NOTE — ED Notes (Signed)
1 mg calcium given

## 2023-02-20 NOTE — ED Triage Notes (Signed)
Pt brought in by EMS CPR in progress. Witnessed arrest by EMS @ 1055. Epi given @ 1057 & 1104. Pulses back on arrival @ 1104. A few runs of vtach per EMS. Pt in sinus tach in triage. Dr.Gray at bedside for intubation.

## 2023-02-20 NOTE — ED Notes (Addendum)
1 mg epic given at this time   bi carb given at this time

## 2023-02-20 NOTE — ED Notes (Signed)
MD at bedside to start central line insertion. Pulses lost at this time. CPR started. 1mg  epi given

## 2023-02-20 NOTE — ED Provider Notes (Addendum)
[place] [facility]MENT AT [facility] Provider Note  CSN: [id number] Arrival date & time: 02/07/2023 1108  Chief Complaint(s) CPR  HPI Maurice Munoz is a 77 y.o. male with past medical history as below, significant for CAD, CKD, diastolic heart failure, HTN, ILD,  s/p DES, pulmonary fibrosis who presents to the ED with complaint of CPR  Per EMS patient was at church, he was having difficulty breathing, pulse ox was in the 70s, he became unresponsive, concern for possible myoclonic jerking by EMS.  CPR was started promptly by bystander who is also a IT sales professional.  Patient had AED applied and no shock was advised.  He was given epi x 2 PTA.  SGA placed in the field.  ROSC obtained upon arrival to the ER.  Level 5 caveat, unresponsive  Past Medical History Past Medical History:  Diagnosis Date   Adrenal adenoma, left    small   Aortic atherosclerosis (HCC)    Back pain    Bilateral carotid artery stenosis followed by dr Tresa Endo   per last carotid duplex 07-17-2014  -- proximal LICA 50-69% ,  RICA 0-49%   CAD (coronary artery disease)    a. s/p prior intervention to RCA and PDA in 1999 b. DES to LAD and distal RCA in 2007   Cardiomegaly    Stable   Chronic constipation    Chronic pain syndrome    CKD (chronic kidney disease), stage III (HCC)    DDD (degenerative disc disease), cervical    DDD (degenerative disc disease), lumbosacral    Dyspnea    Dyspnea on exertion    Gout    per pt last inflared episode 2015 approx.   Grade II diastolic dysfunction    H/O epistaxis    per pt sees ENT dr Suszanne Conners for cauterization (pt takes plavix)   History of acute pancreatitis 08/08/2014   History of kidney stones    HTN (hypertension)    Hyperlipidemia    Interstitial lung disease (HCC)    Left arm pain    s/p fall   Nocturia    Numbness of right foot    due to DDD lumbar   Peripheral edema    Pilonidal cyst    Pleural effusion on right    Pulmonary fibrosis (HCC)     followed by pcp-- last chest CT 04-08-2016   S/P drug eluting coronary stent placement 10/11/2005   mLAD and dRCA   Wears dentures    upper   Wears glasses    Patient Active Problem List   Diagnosis Date Noted   Cardiac arrest (HCC) 02/07/2023   Respiratory failure (HCC) 11/30/2022   Debility 11/30/2022   Acute on chronic respiratory failure with hypoxia (HCC) 11/26/2022   Chronic respiratory failure with hypoxia (HCC) 12/17/2019   Therapeutic drug monitoring 12/17/2019   NSTEMI (non-ST elevated myocardial infarction) (HCC) 04/13/2018   CKD (chronic kidney disease) stage 3, GFR 30-59 ml/min (HCC) 04/12/2018   IPF (idiopathic pulmonary fibrosis) (HCC) 04/12/2018   Essential hypertension    CAD in native artery 04/11/2018   Arthralgia of right temporomandibular joint 02/03/2016   Hyperlipidemia LDL goal <70 09/25/2014   Lower extremity edema 09/25/2014   Nausea    Upper abdominal pain    Pancreatitis-Idiopathy acute 2.18.16 admitted [facility] 08/08/2014   Gout 08/08/2014   CAD s/p stenting 2007, prior LAD h/o 199, last myoview 3/15 Low risk 11/27/2012   Edema 11/27/2012   Obesity (BMI 30-39.9) 11/27/2012   Cervical disc  disease s/p diskecktomy 2003  11/27/2012   GOUT 10/13/2009   KNEE PAIN 09/08/2009   PATELLAR TENDINITIS 09/08/2009   Home Medication(s) Prior to Admission medications   Medication Sig Start Date End Date Taking? Authorizing Provider  acetaminophen (TYLENOL) 325 MG tablet Take 2 tablets (650 mg total) by mouth 4 (four) times daily -  before meals and at bedtime. 12/10/22   Setzer, Lynnell Jude, PA-C  allopurinol (ZYLOPRIM) 300 MG tablet Take 300 mg by mouth in the morning. 11/06/12   [provider]  aspirin EC 81 MG tablet Take 81 mg by mouth in the morning.    [provider]  atorvastatin (LIPITOR) 80 MG tablet TAKE ONE TABLET (80MG  TOTAL) BY MOUTH AT6PM Patient taking differently: Take 80 mg by mouth daily. 02/17/22   Lennette Bihari, MD   clopidogrel (PLAVIX) 75 MG tablet TAKE ONE (1) TABLET BY MOUTH EVERY DAY 12/24/22   Lennette Bihari, MD  cyanocobalamin (VITAMIN B12) 1000 MCG tablet Take 1,000 mcg by mouth in the morning.    [provider]  doxycycline (VIBRAMYCIN) 100 MG capsule Take 100 mg by mouth 2 (two) times daily. 12/24/22   [provider]  ESBRIET 801 MG TABS Take 1 tablet (801 mg total) by mouth 3 (three) times daily with meals. Patient taking differently: Take 1 tablet by mouth See admin instructions. Take 1 tablet (801 mg) by mouth up to 3 times daily with meals. 08/06/22   Mannam, Colbert Coyer, MD  furosemide (LASIX) 20 MG tablet Take 1/2 tablet (10 mg total) by mouth daily. 12/12/22   Setzer, Lynnell Jude, PA-C  isosorbide mononitrate (IMDUR) 30 MG 24 hr tablet Take 15 mg by mouth daily.    [provider]  metoprolol succinate (TOPROL-XL) 25 MG 24 hr tablet Take one tablet in the morning and half a tablet at night 12/13/22   Lennette Bihari, MD  nitroGLYCERIN (NITROSTAT) 0.4 MG SL tablet Place 1 tablet (0.4 mg total) under the tongue every 5 (five) minutes as needed for chest pain. 08/03/17 10/14/23  Lennette Bihari, MD  oxyCODONE-acetaminophen (PERCOCET) 10-325 MG tablet Take 1 tablet by mouth 4 (four) times daily as needed. 12/16/22   [provider]  prochlorperazine (COMPAZINE) 10 MG tablet TAKE ONE TABLET (10MG  TOTAL) BY MOUTH EVERY SIX HOURS AS NEEDED FOR NAUSEA OR VOMITING 01/04/23   Mannam, Praveen, MD  tiZANidine (ZANAFLEX) 4 MG tablet Take 4 mg by mouth at bedtime.  11/08/12   [provider]  VITAMIN D PO Take 5,000 Units by mouth in the morning.    [provider]                                                                                                                                    Past Surgical History Past Surgical History:  Procedure Laterality Date   ANTERIOR CERVICAL DECOMP/DISCECTOMY FUSION  12-25-2001  dr Terrilee Files [facility]   C3 -- C7   ANTERIOR  CERVICAL DECOMP/DISCECTOMY FUSION  12-11-2012   dr Channing Mutters  [facility])   CARDIOVASCULAR STRESS TEST  08-23-2012    dr Tresa Endo   normal lexiscan nuclear study w/ no ischemia/  normal LV function and wall motion, ef 65%   CARDIOVASCULAR STRESS TEST     CORONARY ANGIOPLASTY  1999   PTCA to RCA & PDA   CORONARY ANGIOPLASTY WITH STENT PLACEMENT  10-11-2005  dr [name]   PCI and stenting to midLAD & dRCA (Cypher DES x2)   CORONARY BALLOON ANGIOPLASTY N/A 04/14/2018   Procedure: CORONARY BALLOON ANGIOPLASTY;  Surgeon: Lennette Bihari, MD;  Location: [facility] INVASIVE CV LAB;  Service: Cardiovascular;  Laterality: N/A;   CORONARY STENT INTERVENTION N/A 04/14/2018   Procedure: CORONARY STENT INTERVENTION;  Surgeon: Lennette Bihari, MD;  Location: [facility] INVASIVE CV LAB;  Service: Cardiovascular;  Laterality: N/A;   CYSTOSCOPY W/ URETERAL STENT PLACEMENT Left 02-11-2010     [facility]   LEFT HEART CATH N/A 04/14/2018   Procedure: Left Heart Cath;  Surgeon: Lennette Bihari, MD;  Location: [facility] INVASIVE CV LAB;  Service: Cardiovascular;  Laterality: N/A;   LEFT HEART CATH AND CORONARY ANGIOGRAPHY N/A 04/12/2018   Procedure: LEFT HEART CATH AND CORONARY ANGIOGRAPHY;  Surgeon: Corky Crafts, MD;  Location: [facility] INVASIVE CV LAB;  Service: Cardiovascular;  Laterality: N/A;   LUMBAR LAMINECTOMY  2015   L5 -- S1   PILONIDAL CYST EXCISION  1980s   PILONIDAL CYST EXCISION N/A 03/31/2017   Procedure: EXCISION OF PILONIDAL DISEASE with Flap Rotation;  Surgeon: Romie Levee, MD;  Location: [facility];  Service: General;  Laterality: N/A;   RIGHT/LEFT HEART CATH AND CORONARY ANGIOGRAPHY N/A 10/18/2022   Procedure: RIGHT/LEFT HEART CATH AND CORONARY ANGIOGRAPHY;  Surgeon: Lennette Bihari, MD;  Location: [facility] INVASIVE CV LAB;  Service: Cardiovascular;  Laterality: N/A;   THROAT SURGERY  1996   per pt "removal piece of cryloid(?) that was pressing against throat"  states no issues since   TRANSTHORACIC  ECHOCARDIOGRAM  08-24-2010  dr Tresa Endo   ef 50-55%/  mild MR/  trivial TR   WOUND DEBRIDEMENT N/A 10/06/2017   Procedure: DEBRIDEMENT WOUND PLACEMENT OF WOUND HEALING MATRIX;  Surgeon: Romie Levee, MD;  Location: [facility];  Service: General;  Laterality: N/A;   Family History Family History  Problem Relation Age of Onset   Cancer Father    Heart attack Brother    Diabetes Brother    Colon cancer Neg Hx     Social History Social History   Tobacco Use   Smoking status: Former    Current packs/day: 0.00    Average packs/day: 3.0 packs/day for 25.0 years (75.0 ttl pk-yrs)    Types: Cigarettes    Start date: 11/28/1963    Quit date: 11/27/1988    Years since quitting: 34.2   Smokeless tobacco: Former    Quit date: 03/28/1989  Vaping Use   Vaping status: Never Used  Substance Use Topics   Alcohol use: No   Drug use: No   Allergies Ibuprofen, Neosporin [neomycin-bacitracin zn-polymyx], and Penicillins  Review of Systems Review of Systems  Unable to perform ROS: Patient unresponsive    Physical Exam Vital Signs  I have reviewed the triage vital signs BP (!) 117/41   Pulse (!) 120   Temp (!) 97.5 F (36.4 C) (Axillary)   Resp (!) 64  Ht 6\' 1"  (1.854 m)   Wt 84 kg   SpO2 (!) 77%   BMI 24.43 kg/m  Physical Exam Vitals and nursing note reviewed. Exam conducted with a chaperone present.  Constitutional:      General: He is in acute distress.     Appearance: He is normal weight. He is ill-appearing.  HENT:     Head: Normocephalic and atraumatic.     Right Ear: External ear normal.     Left Ear: External ear normal.     Nose: Nose normal.     Mouth/Throat:     Mouth: Mucous membranes are dry.  Eyes:     General: No scleral icterus.       Right eye: No discharge.        Left eye: No discharge.     Pupils: Pupils are equal, round, and reactive to light.  Neck:     Comments: marked kyphosis is noted Cardiovascular:     Rate and Rhythm: Regular  rhythm. Tachycardia present.     Pulses: Normal pulses.     Heart sounds: Normal heart sounds. No murmur heard. Pulmonary:     Comments: Mechanical breath sounds b/l, diminished  Abdominal:     General: Abdomen is flat.     Palpations: Abdomen is soft.     Tenderness: There is no abdominal tenderness.  Musculoskeletal:     Right lower leg: No edema.     Left lower leg: No edema.  Skin:    General: Skin is warm and dry.     Capillary Refill: Capillary refill takes less than 2 seconds.     Coloration: Skin is not jaundiced.  Neurological:     Mental Status: He is unresponsive.     GCS: GCS eye subscore is 1. GCS verbal subscore is 1. GCS motor subscore is 1.     ED Results and Treatments Labs (all labs ordered are listed, but only abnormal results are displayed) Labs Reviewed  CBC WITH DIFFERENTIAL/PLATELET - Abnormal; Notable for the following components:      Result Value   WBC 16.2 (*)    Neutro Abs 9.5 (*)    Lymphs Abs 5.1 (*)    Abs Immature Granulocytes 0.32 (*)    All other components within normal limits  COMPREHENSIVE METABOLIC PANEL - Abnormal; Notable for the following components:   Potassium 3.1 (*)    CO2 18 (*)    Glucose, Bld 197 (*)    Creatinine, Ser 1.45 (*)    Calcium 8.8 (*)    Total Protein 6.2 (*)    Albumin 3.2 (*)    AST 42 (*)    GFR, Estimated 50 (*)    Anion gap 19 (*)    All other components within normal limits  BLOOD GAS, ARTERIAL - Abnormal; Notable for the following components:   pH, Arterial 7.19 (*)    pCO2 arterial 52 (*)    pO2, Arterial 76 (*)    Bicarbonate 19.9 (*)    Acid-base deficit 8.6 (*)    All other components within normal limits  LACTIC ACID, PLASMA - Abnormal; Notable for the following components:   Lactic Acid, Venous >9.0 (*)    All other components within normal limits  BRAIN NATRIURETIC PEPTIDE - Abnormal; Notable for the following components:   B Natriuretic Peptide 2,524.0 (*)    All other components within  normal limits  CBG MONITORING, ED - Abnormal; Notable for the following components:   Glucose-Capillary  169 (*)    All other components within normal limits  I-STAT CHEM 8, ED - Abnormal; Notable for the following components:   Potassium 3.1 (*)    BUN 24 (*)    Creatinine, Ser 1.50 (*)    Glucose, Bld 185 (*)    TCO2 20 (*)    All other components within normal limits  TROPONIN I (HIGH SENSITIVITY) - Abnormal; Notable for the following components:   Troponin I (High Sensitivity) 36 (*)    All other components within normal limits  CULTURE, BLOOD (ROUTINE X 2)                                                                                                                          Radiology DG Chest Port 1 View  Result Date: 02/05/2023 CLINICAL DATA:  Difficult intubation, cardiac arrest. EXAM: PORTABLE CHEST 1 VIEW COMPARISON:  Chest radiograph dated 11/28/2022. FINDINGS: An endotracheal tube terminates in the midthoracic trachea. An enteric tube enters the stomach and terminates below the field of view. Defibrillator pads overlie the chest. The heart is borderline enlarged. Vascular calcifications are seen in the aortic arch. Moderate diffuse bilateral chronic interstitial opacities with superimposed interstitial and airspace opacities are noted. No large pleural effusion or pneumothorax on either side. Degenerative changes are seen in the spine. IMPRESSION: Moderate diffuse bilateral chronic interstitial opacities with superimposed interstitial and airspace opacities. Electronically Signed   By: Romona Curls M.D.   On: 02/14/2023 11:48    Pertinent labs & imaging results that were available during my care of the patient were reviewed by me and considered in my medical decision making (see MDM for details).  Medications Ordered in ED Medications  midazolam (VERSED) injection 1 mg (has no administration in time range)  midazolam (VERSED) injection 1 mg (has no administration in time range)   propofol (DIPRIVAN) 1000 MG/100ML infusion ( Intravenous Not Given 02/05/2023 1227)  cefTRIAXone (ROCEPHIN) 1 g in sodium chloride 0.9 % 100 mL IVPB (has no administration in time range)  azithromycin (ZITHROMAX) 500 mg in sodium chloride 0.9 % 250 mL IVPB (has no administration in time range)  potassium chloride 10 mEq in 100 mL IVPB (has no administration in time range)  0.9 %  sodium chloride infusion (250 mLs Intravenous Not Given 01/24/2023 1446)  norepinephrine (LEVOPHED) 4mg  in (0.016 mg/mL) premix infusion (0 mcg/min Intravenous Stopped 02/08/2023 1713)  EPINEPHrine (ADRENALIN) 5 mg in NS 250 mL (0.02 mg/mL) premix infusion (0 mcg/min Intravenous Stopped 01/20/2023 1714)  EPINEPHrine (ADRENALIN) 1 MG/10ML injection (1 mg Intravenous Given 01/22/2023 1334)  amiodarone (NEXTERONE PREMIX) 360-4.14 MG/200ML-% (1.8 mg/mL) IV infusion (0 mg/hr Intravenous Stopped 02/09/2023 1714)  amiodarone (NEXTERONE PREMIX) 360-4.14 MG/200ML-% (1.8 mg/mL) IV infusion (has no administration in time range)  amiodarone (NEXTERONE) 1.5 mg/mL IV bolus only (  Not Given 01/30/2023 1447)  etomidate (AMIDATE) injection 20 mg (20 mg Intravenous Given 02/18/2023 1113)  rocuronium (ZEMURON) injection 90 mg (90 mg Intravenous  Given 02/03/2023 1112)  sodium chloride 0.9 % bolus 1,000 mL (0 mLs Intravenous Stopped 01/22/2023 1713)                                                                                                                                     Procedures .Critical Care  Performed by: Sloan Leiter, DO Authorized by: Sloan Leiter, DO   Critical care provider statement:    Critical care time (minutes):  70   Critical care time was exclusive of:  Separately billable procedures and treating other patients   Critical care was necessary to treat or prevent imminent or life-threatening deterioration of the following conditions:  Cardiac failure, circulatory failure and respiratory failure   Critical care was time spent  personally by me on the following activities:  Development of treatment plan with patient or surrogate, discussions with consultants, evaluation of patient's response to treatment, examination of patient, ordering and review of laboratory studies, ordering and review of radiographic studies, ordering and performing treatments and interventions, pulse oximetry, re-evaluation of patient's condition, review of old charts and obtaining history from patient or surrogate   Care discussed with: admitting provider   Procedure Name: Intubation Date/Time: 02/15/2023 1:30 PM  Performed by: Sloan Leiter, DOPre-anesthesia Checklist: Patient identified, Emergency Drugs available, Timeout performed, Patient being monitored and Suction available Preoxygenation: Pre-oxygenation with 100% oxygen Induction Type: Rapid sequence Laryngoscope Size: Glidescope and 4 Grade View: Grade I Tube size: 7.5 mm Number of attempts: 1 Secured at: 23 cm Tube secured with: ETT holder Dental Injury: Teeth and Oropharynx as per pre-operative assessment  Difficulty Due To: Difficulty was anticipated Future Recommendations: Recommend- induction with short-acting agent, and alternative techniques readily available    .Cardioversion  Date/Time: 02/19/2023 2:00 PM  Performed by: Sloan Leiter, DO Authorized by: Sloan Leiter, DO   Consent:    Consent obtained:  Emergent situation Pre-procedure details:    Cardioversion basis:  Emergent   Rhythm:  Ventricular tachycardia   Electrode placement:  Anterior-posterior Attempt one:    Cardioversion mode:  Synchronous   Shock (Joules):  150   Shock outcome:  Conversion to normal sinus rhythm Post-procedure details:    Patient status:  Unresponsive   Patient tolerance of procedure:  Tolerated well, no immediate complications Comments:     Pt unresponsive, palpable pulse    (including critical care time)  Medical Decision Making / ED Course    Medical Decision  Making:    HAYSTON PINGREE is a 77 y.o. male with past medical history as below, significant for CAD, CKD, diastolic heart failure, HTN, ILD, DES, pulmonary fibrosis who presents to the ED with complaint of CPR. The complaint involves an extensive differential diagnosis and also carries with it a high risk of complications and morbidity.  Serious etiology was considered. Ddx includes but is not limited to: ACS, respiratory arrest, intoxication, hypoglycemia, metabolic, etc.  Complete initial physical exam performed, notably the patient  was responsive.    Reviewed and confirmed nursing documentation for past medical history, family history, social history.  Vital signs reviewed.    Narrative: 77 yo male with extensive cardiac history and pulmonary fibrosis here status post CPR/rosc   Intubated on arrival with roc and etomidate; his eyes were opening intermittently after ROSC was obtained and did have movement to his extremities Initial EKG without STEMI  Clinical Course as of 02/10/2023 2014  Sun Feb 06, 2023  1133 Intubated on arrival  [SG]  1133 EKG with sinus tachy [SG]  1224 Potassium(!): 3.1 replete [SG]  1224 B Natriuretic Peptide(!): 2,524.0 Worsened from prior [SG]  1224 Creatinine(!): 1.45 1.42 around one month ago, otherwise was closer to 1.2 [SG]  1225 WBC(!): 16.2 Around 12 two months ago, was previously on steroids  [SG]  1225 CXR with diffuse interstitial opacities, acute on chronic? Will cover with abx [SG]  1240 Spoke with Dr Tonia Brooms, accepted MICU [SG]  1448 TOD 1422 [SG]    Clinical Course User Index [SG] Sloan Leiter, DO   Per cardiology note 6/24 Dr Tresa Endo, pt with paroxysmal tachycardia at home to 120's. Use O2 at home (2L per brother at bedside).   Dr Isaiah Serge pulm (pulm fibrosis) on Esbriet. Resumed metoprolol at last cardiology visit (25mg  morning and 12.5 at bedtime)  Patient was admitted by pulmonology for MICU Dr. Tonia Brooms.  Shortly after pulses were lost  again and ACLS was resumed.  Patient had intermittent ROSC but pulses were again lost.  Started on levo/epi gtt, amio gtt; did cardiovert x1 with unstable vtach while on amio gtt with improvement to NSR. While preparing for CVC pulses again lost. Lengthy discussion with family/DPOA at bedside in regards to ongoing patient care and wishes.  At this time we will cease resuscitation efforts.  Time of death 65  Refer to ED code summary for full details on medications and procedures. Asystole was confirmed by 3 lead EKG and/or bedside US. No spontaneous respirations, no purposeful movements, asystole. Likely cause of death is Cardiac arrest/respiratory arrest. PCP, was unable to be notified. This isn't a medical examiner case. Family member, Optician, dispensing Granddaughter was at bedside to identify the pt. Also d/w pt's brother at bedside and family friends.    [name], MD PCP, no answer at office or for provided on-call physician number [phone]          Additional history obtained: -Additional history obtained from EMS -External records from outside source obtained and reviewed including: Chart review including previous notes, labs, imaging, consultation notes     Lab Tests: -I ordered, reviewed, and interpreted labs.   The pertinent results include:   Labs Reviewed  CBC WITH DIFFERENTIAL/PLATELET - Abnormal; Notable for the following components:      Result Value   WBC 16.2 (*)    Neutro Abs 9.5 (*)    Lymphs Abs 5.1 (*)    Abs Immature Granulocytes 0.32 (*)    All other components within normal limits  COMPREHENSIVE METABOLIC PANEL - Abnormal; Notable for the following components:   Potassium 3.1 (*)    CO2 18 (*)    Glucose, Bld 197 (*)    Creatinine, Ser 1.45 (*)    Calcium 8.8 (*)    Total Protein 6.2 (*)    Albumin 3.2 (*)    AST 42 (*)    GFR, Estimated 50 (*)    Anion gap 19 (*)  All other components within normal limits  BLOOD GAS, ARTERIAL - Abnormal;  Notable for the following components:   pH, Arterial 7.19 (*)    pCO2 arterial 52 (*)    pO2, Arterial 76 (*)    Bicarbonate 19.9 (*)    Acid-base deficit 8.6 (*)    All other components within normal limits  LACTIC ACID, PLASMA - Abnormal; Notable for the following components:   Lactic Acid, Venous >9.0 (*)    All other components within normal limits  BRAIN NATRIURETIC PEPTIDE - Abnormal; Notable for the following components:   B Natriuretic Peptide 2,524.0 (*)    All other components within normal limits  CBG MONITORING, ED - Abnormal; Notable for the following components:   Glucose-Capillary 169 (*)    All other components within normal limits  I-STAT CHEM 8, ED - Abnormal; Notable for the following components:   Potassium 3.1 (*)    BUN 24 (*)    Creatinine, Ser 1.50 (*)    Glucose, Bld 185 (*)    TCO2 20 (*)    All other components within normal limits  TROPONIN I (HIGH SENSITIVITY) - Abnormal; Notable for the following components:   Troponin I (High Sensitivity) 36 (*)    All other components within normal limits  CULTURE, BLOOD (ROUTINE X 2)    Notable for lactic acid +++ wbc ++  EKG   EKG Interpretation Date/Time:  Sunday February 06 2023 11:22:58 EDT Ventricular Rate:  140 PR Interval:  140 QRS Duration:  109 QT Interval:  373 QTC Calculation: 570 R Axis:   101  Text Interpretation: Sinus tachycardia Ventricular premature complex Right axis deviation Low voltage, precordial leads Repolarization abnormality, prob rate related Prolonged QT interval similar tracing 6/24 Reconfirmed by Tanda Rockers ([id number]) on 02/04/2023 12:30:24 PM         Imaging Studies ordered: I ordered imaging studies including CXR I independently visualized the following imaging with scope of interpretation limited to determining acute life threatening conditions related to emergency care; findings noted above, significant for diffuse opacities, ett in position I independently visualized and  interpreted imaging. I agree with the radiologist interpretation   Medicines ordered and prescription drug management: Meds ordered this encounter  Medications   etomidate (AMIDATE) injection 20 mg   rocuronium (ZEMURON) injection 90 mg   DISCONTD: propofol (DIPRIVAN) 1000 MG/100ML infusion   propofol (DIPRIVAN) 1000 MG/100ML infusion    Nori Riis N: cabinet override   DISCONTD: midazolam PF (VERSED) 10 MG/2ML injection    Nori Riis N: cabinet override   midazolam (VERSED) injection 1 mg   midazolam (VERSED) injection 1 mg   propofol (DIPRIVAN) 1000 MG/100ML infusion   cefTRIAXone (ROCEPHIN) 1 g in sodium chloride 0.9 % 100 mL IVPB    Order Specific Question:   Antibiotic Indication:    Answer:   CAP   azithromycin (ZITHROMAX) 500 mg in sodium chloride 0.9 % 250 mL IVPB    Order Specific Question:   Antibiotic Indication:    Answer:   CAP   potassium chloride 10 mEq in 100 mL IVPB   0.9 %  sodium chloride infusion   norepinephrine (LEVOPHED) 4mg  in (0.016 mg/mL) premix infusion    Order Specific Question:   IV Access    Answer:   Peripheral   sodium chloride 0.9 % bolus 1,000 mL   EPINEPHrine (ADRENALIN) 5 mg in NS 250 mL (0.02 mg/mL) premix infusion   EPINEPHrine (ADRENALIN) 1 MG/10ML injection   amiodarone (  NEXTERONE PREMIX) 360-4.14 MG/200ML-% (1.8 mg/mL) IV infusion   amiodarone (NEXTERONE PREMIX) 360-4.14 MG/200ML-% (1.8 mg/mL) IV infusion   amiodarone (NEXTERONE PREMIX) 360-4.14 MG/200ML-% (1.8 mg/mL) IV infusion    Christin Fudge, Dawn M: cabinet override   amiodarone (NEXTERONE) 1.5 mg/mL IV bolus only    Christin Fudge, Dawn M: cabinet override    -I have reviewed the patients home medicines and have made adjustments as needed   Consultations Obtained: I requested consultation with the PCCM DR icard,  and discussed lab and imaging findings as well as pertinent plan - they recommend: admit   Cardiac Monitoring: The patient was maintained on a cardiac monitor.  I  personally viewed and interpreted the cardiac monitored which showed an underlying rhythm of: sinus tachy  Social Determinants of Health:  Diagnosis or treatment significantly limited by social determinants of health: former smoker   Reevaluation: After the interventions noted above, I reevaluated the patient and found that they have worsened  Co morbidities that complicate the patient evaluation  Past Medical History:  Diagnosis Date   Adrenal adenoma, left    small   Aortic atherosclerosis (HCC)    Back pain    Bilateral carotid artery stenosis followed by dr Tresa Endo   per last carotid duplex 07-17-2014  -- proximal LICA 50-69% ,  RICA 0-49%   CAD (coronary artery disease)    a. s/p prior intervention to RCA and PDA in 1999 b. DES to LAD and distal RCA in 2007   Cardiomegaly    Stable   Chronic constipation    Chronic pain syndrome    CKD (chronic kidney disease), stage III (HCC)    DDD (degenerative disc disease), cervical    DDD (degenerative disc disease), lumbosacral    Dyspnea    Dyspnea on exertion    Gout    per pt last inflared episode 2015 approx.   Grade II diastolic dysfunction    H/O epistaxis    per pt sees ENT dr Suszanne Conners for cauterization (pt takes plavix)   History of acute pancreatitis 08/08/2014   History of kidney stones    HTN (hypertension)    Hyperlipidemia    Interstitial lung disease (HCC)    Left arm pain    s/p fall   Nocturia    Numbness of right foot    due to DDD lumbar   Peripheral edema    Pilonidal cyst    Pleural effusion on right    Pulmonary fibrosis (HCC)    followed by pcp-- last chest CT 04-08-2016   S/P drug eluting coronary stent placement 10/11/2005   mLAD and dRCA   Wears dentures    upper   Wears glasses       Dispostion: Disposition decision including need for hospitalization was considered, and patient Expired    Final Clinical Impression(s) / ED Diagnoses Final diagnoses:  Acute respiratory failure with hypoxia  [facility])  Cardiac arrest (HCC)        Sloan Leiter, DO 02/17/2023 [id number]    Sloan Leiter, DO 02/07/2023 2017

## 2023-02-20 NOTE — ED Notes (Signed)
Amioderone 300mg  bolus push given at this time. Verbal order by dr. Wallace Cullens

## 2023-02-20 NOTE — ED Notes (Signed)
Pulsesless vtach on zoll. 150J shock delivered by DR.Wallace Cullens

## 2023-02-20 NOTE — ED Notes (Signed)
Pulses lost at this time cpr started

## 2023-02-20 NOTE — ED Notes (Signed)
2G of mag given IV push verbal order by dr.Gray

## 2023-02-20 NOTE — ED Notes (Signed)
Pt intubated, 23@ lip 7.5 ET tube

## 2023-02-20 NOTE — ED Notes (Signed)
1mg  Epi Given BI carb given at this time

## 2023-02-20 NOTE — ED Notes (Signed)
Pt and room cleaned for presentation for patient friends and family. Eye prep completed, and patient is clean and dry. Garbage and equipment removed for presentation. Chairs provided for friends and family. Friends and family declined refreshments. RN gave name and requested friends and family call out for any needs or questions. Friends and family are at bedside now.  Post mortem care is complete with the exception of transportation to the morgue. All belongings have been accounted for and returned to family. Condolences have been extended to friends and family.

## 2023-02-20 NOTE — ED Notes (Signed)
1mg  epi given, pulses back at this time

## 2023-02-20 DEATH — deceased

## 2023-03-11 ENCOUNTER — Other Ambulatory Visit (HOSPITAL_COMMUNITY): Payer: Self-pay

## 2023-03-14 ENCOUNTER — Other Ambulatory Visit (HOSPITAL_COMMUNITY): Payer: Self-pay

## 2023-03-16 ENCOUNTER — Other Ambulatory Visit (HOSPITAL_COMMUNITY): Payer: Self-pay

## 2023-05-03 ENCOUNTER — Ambulatory Visit: Payer: Medicare Other | Admitting: Cardiovascular Disease
